# Patient Record
Sex: Female | Born: 1935
Health system: Southern US, Community
[De-identification: ages and names within clinical notes are randomized; demographics above are authoritative.]

## PROBLEM LIST (undated history)

## (undated) DIAGNOSIS — C50919 Malignant neoplasm of unspecified site of unspecified female breast: Secondary | ICD-10-CM

## (undated) DIAGNOSIS — E785 Hyperlipidemia, unspecified: Secondary | ICD-10-CM

## (undated) DIAGNOSIS — H3530 Unspecified macular degeneration: Secondary | ICD-10-CM

## (undated) DIAGNOSIS — I2510 Atherosclerotic heart disease of native coronary artery without angina pectoris: Secondary | ICD-10-CM

## (undated) DIAGNOSIS — I10 Essential (primary) hypertension: Secondary | ICD-10-CM

## (undated) DIAGNOSIS — I4892 Unspecified atrial flutter: Secondary | ICD-10-CM

## (undated) DIAGNOSIS — I499 Cardiac arrhythmia, unspecified: Secondary | ICD-10-CM

## (undated) DIAGNOSIS — K922 Gastrointestinal hemorrhage, unspecified: Secondary | ICD-10-CM

## (undated) DIAGNOSIS — F32A Depression, unspecified: Secondary | ICD-10-CM

## (undated) DIAGNOSIS — I428 Other cardiomyopathies: Secondary | ICD-10-CM

## (undated) DIAGNOSIS — Z950 Presence of cardiac pacemaker: Secondary | ICD-10-CM

## (undated) DIAGNOSIS — D62 Acute posthemorrhagic anemia: Secondary | ICD-10-CM

## (undated) DIAGNOSIS — I429 Cardiomyopathy, unspecified: Secondary | ICD-10-CM

## (undated) DIAGNOSIS — I4819 Other persistent atrial fibrillation: Secondary | ICD-10-CM

## (undated) DIAGNOSIS — C679 Malignant neoplasm of bladder, unspecified: Secondary | ICD-10-CM

## (undated) DIAGNOSIS — I4891 Unspecified atrial fibrillation: Secondary | ICD-10-CM

## (undated) DIAGNOSIS — M1990 Unspecified osteoarthritis, unspecified site: Secondary | ICD-10-CM

## (undated) DIAGNOSIS — F329 Major depressive disorder, single episode, unspecified: Secondary | ICD-10-CM

## (undated) HISTORY — DX: Gastrointestinal hemorrhage, unspecified: K92.2

## (undated) HISTORY — DX: Essential (primary) hypertension: I10

## (undated) HISTORY — DX: Cardiomyopathy, unspecified: I42.9

## (undated) HISTORY — DX: Depression, unspecified: F32.A

## (undated) HISTORY — PX: APPENDECTOMY: SHX54

## (undated) HISTORY — DX: Unspecified osteoarthritis, unspecified site: M19.90

## (undated) HISTORY — DX: Other cardiomyopathies: I42.8

## (undated) HISTORY — DX: Malignant neoplasm of bladder, unspecified: C67.9

## (undated) HISTORY — DX: Atherosclerotic heart disease of native coronary artery without angina pectoris: I25.10

## (undated) HISTORY — PX: BLADDER SURGERY: SHX569

## (undated) HISTORY — PX: OTHER SURGICAL HISTORY: SHX169

## (undated) HISTORY — DX: Acute posthemorrhagic anemia: D62

## (undated) HISTORY — DX: Malignant neoplasm of unspecified site of unspecified female breast: C50.919

## (undated) HISTORY — DX: Unspecified atrial fibrillation: I48.91

## (undated) HISTORY — DX: Unspecified atrial flutter: I48.92

## (undated) HISTORY — DX: Other persistent atrial fibrillation: I48.19

## (undated) HISTORY — PX: ABDOMINAL HYSTERECTOMY: SHX81

## (undated) HISTORY — PX: TOTAL KNEE ARTHROPLASTY: SHX125

## (undated) HISTORY — PX: TONSILLECTOMY: SUR1361

## (undated) HISTORY — DX: Major depressive disorder, single episode, unspecified: F32.9

---

## 2004-02-17 ENCOUNTER — Inpatient Hospital Stay (HOSPITAL_COMMUNITY): Admission: RE | Admit: 2004-02-17 | Discharge: 2004-02-22 | Payer: Self-pay | Admitting: Orthopaedic Surgery

## 2004-02-21 ENCOUNTER — Ambulatory Visit: Payer: Self-pay | Admitting: Cardiology

## 2004-02-21 ENCOUNTER — Encounter: Payer: Self-pay | Admitting: Cardiology

## 2004-03-15 ENCOUNTER — Encounter: Payer: Self-pay | Admitting: Cardiology

## 2004-03-15 ENCOUNTER — Ambulatory Visit: Payer: Self-pay | Admitting: Cardiology

## 2004-12-14 ENCOUNTER — Ambulatory Visit (HOSPITAL_COMMUNITY): Admission: RE | Admit: 2004-12-14 | Discharge: 2004-12-14 | Payer: Self-pay

## 2005-01-17 ENCOUNTER — Inpatient Hospital Stay (HOSPITAL_COMMUNITY): Admission: EM | Admit: 2005-01-17 | Discharge: 2005-01-21 | Payer: Self-pay | Admitting: Emergency Medicine

## 2005-01-19 ENCOUNTER — Ambulatory Visit: Payer: Self-pay | Admitting: Internal Medicine

## 2005-02-16 ENCOUNTER — Ambulatory Visit: Payer: Self-pay | Admitting: Internal Medicine

## 2005-03-02 ENCOUNTER — Ambulatory Visit: Payer: Self-pay | Admitting: Internal Medicine

## 2005-04-23 ENCOUNTER — Ambulatory Visit (HOSPITAL_COMMUNITY): Admission: RE | Admit: 2005-04-23 | Discharge: 2005-04-23 | Payer: Self-pay | Admitting: Pulmonary Disease

## 2005-06-27 ENCOUNTER — Ambulatory Visit: Payer: Self-pay | Admitting: Internal Medicine

## 2005-08-28 ENCOUNTER — Ambulatory Visit: Payer: Self-pay | Admitting: Gastroenterology

## 2005-08-28 ENCOUNTER — Encounter (INDEPENDENT_AMBULATORY_CARE_PROVIDER_SITE_OTHER): Payer: Self-pay | Admitting: *Deleted

## 2005-08-28 ENCOUNTER — Inpatient Hospital Stay (HOSPITAL_COMMUNITY): Admission: RE | Admit: 2005-08-28 | Discharge: 2005-09-01 | Payer: Self-pay | Admitting: Orthopaedic Surgery

## 2005-09-06 ENCOUNTER — Observation Stay (HOSPITAL_COMMUNITY): Admission: AD | Admit: 2005-09-06 | Discharge: 2005-09-06 | Payer: Self-pay | Admitting: Orthopaedic Surgery

## 2005-09-29 ENCOUNTER — Inpatient Hospital Stay (HOSPITAL_COMMUNITY): Admission: EM | Admit: 2005-09-29 | Discharge: 2005-10-04 | Payer: Self-pay | Admitting: Emergency Medicine

## 2005-10-08 ENCOUNTER — Encounter: Payer: Self-pay | Admitting: Cardiology

## 2005-10-15 ENCOUNTER — Encounter: Payer: Self-pay | Admitting: Cardiology

## 2005-12-10 ENCOUNTER — Encounter (HOSPITAL_COMMUNITY): Admission: RE | Admit: 2005-12-10 | Discharge: 2006-01-07 | Payer: Self-pay | Admitting: Orthopaedic Surgery

## 2006-10-08 ENCOUNTER — Ambulatory Visit (HOSPITAL_COMMUNITY): Admission: RE | Admit: 2006-10-08 | Discharge: 2006-10-08 | Payer: Self-pay | Admitting: Pulmonary Disease

## 2006-10-15 ENCOUNTER — Ambulatory Visit (HOSPITAL_COMMUNITY): Admission: RE | Admit: 2006-10-15 | Discharge: 2006-10-15 | Payer: Self-pay | Admitting: Pulmonary Disease

## 2006-10-30 ENCOUNTER — Ambulatory Visit (HOSPITAL_COMMUNITY): Admission: RE | Admit: 2006-10-30 | Discharge: 2006-10-30 | Payer: Self-pay | Admitting: Urology

## 2006-12-21 ENCOUNTER — Emergency Department (HOSPITAL_COMMUNITY): Admission: EM | Admit: 2006-12-21 | Discharge: 2006-12-21 | Payer: Self-pay | Admitting: Emergency Medicine

## 2007-10-27 ENCOUNTER — Ambulatory Visit: Payer: Self-pay | Admitting: Cardiology

## 2008-06-22 ENCOUNTER — Encounter: Payer: Self-pay | Admitting: Cardiology

## 2008-10-06 DIAGNOSIS — I4892 Unspecified atrial flutter: Secondary | ICD-10-CM | POA: Insufficient documentation

## 2009-01-21 ENCOUNTER — Encounter: Payer: Self-pay | Admitting: Physician Assistant

## 2009-01-21 ENCOUNTER — Ambulatory Visit: Payer: Self-pay | Admitting: Cardiology

## 2009-01-21 DIAGNOSIS — I10 Essential (primary) hypertension: Secondary | ICD-10-CM | POA: Insufficient documentation

## 2009-01-21 DIAGNOSIS — E782 Mixed hyperlipidemia: Secondary | ICD-10-CM | POA: Insufficient documentation

## 2009-01-25 ENCOUNTER — Encounter: Payer: Self-pay | Admitting: Cardiology

## 2009-02-22 ENCOUNTER — Encounter (INDEPENDENT_AMBULATORY_CARE_PROVIDER_SITE_OTHER): Payer: Self-pay | Admitting: *Deleted

## 2009-02-22 ENCOUNTER — Ambulatory Visit: Payer: Self-pay | Admitting: Cardiology

## 2009-03-01 ENCOUNTER — Encounter: Payer: Self-pay | Admitting: Cardiology

## 2009-03-02 ENCOUNTER — Telehealth (INDEPENDENT_AMBULATORY_CARE_PROVIDER_SITE_OTHER): Payer: Self-pay | Admitting: *Deleted

## 2009-03-18 ENCOUNTER — Ambulatory Visit: Payer: Self-pay | Admitting: Cardiology

## 2009-09-26 ENCOUNTER — Ambulatory Visit: Payer: Self-pay | Admitting: Cardiology

## 2009-09-26 DIAGNOSIS — I498 Other specified cardiac arrhythmias: Secondary | ICD-10-CM | POA: Insufficient documentation

## 2010-02-07 ENCOUNTER — Ambulatory Visit: Admit: 2010-02-07 | Payer: Self-pay | Admitting: Internal Medicine

## 2010-02-07 NOTE — Assessment & Plan Note (Signed)
Summary: 48 hour holter monitor  Nurse Visit   Serial Vital Signs/Assessments:  Comments: 9:09 AM holter placed on patient w/o difficulty. instructions given to patient. patient verbalized understanding of plan. By: Georgina Peer   CC: 48hour holter   Allergies: 1)  ! Pcn

## 2010-02-07 NOTE — Assessment & Plan Note (Signed)
Summary: 1 mo fu remionder-srs   Visit Type:  Follow-up Primary Provider:  Dr. Rory Percy   History of Present Illness: 77 year old woman presents for a followup visit. Since her last visit a 48-hour Holter monitor was placed to better assess heart rate variability and rhythm in light of occasional episodes of weakness. She has not had any frank syncope. These strips were reviewed with the predominant rhythm being sinus. There was a 2.6 second pause seen. Beta blocker therapy was down titrated since then.  She denies having any frank dizziness or syncope. She does however indicate feeling generally better with less fatigue and weakness since decreasing beta blocker therapy.  Preventive Screening-Counseling & Management  Alcohol-Tobacco     Smoking Status: never  Current Medications (verified): 1)  Metoprolol Succinate 25 Mg Xr24h-Tab (Metoprolol Succinate) .... Take One Tablet By Mouth Daily 2)  Prozac 20 Mg Caps (Fluoxetine Hcl) .... Take 1 Tablet By Mouth Once A Day 3)  Niacin Cr 500 Mg Cr-Caps (Niacin) .... Take 1 Tablet By Mouth Once A Day 4)  Multivitamins  Tabs (Multiple Vitamin) .... Take 1 Tablet By Mouth Once A Day 5)  Coq10 100 Mg Caps (Coenzyme Q10) .... Take 1 Tablet By Mouth Once A Day 6)  Tylenol Extra Strength 500 Mg Tabs (Acetaminophen) .... As Needed. 7)  Asacol 400 Mg Tbec (Mesalamine) .... Take 2 Tabs Two Times A Day 8)  Aspirin 325 Mg Tabs (Aspirin) .... Take 1/2 Tab Daily  Allergies: 1)  ! Pcn  Past History:  Past Medical History: Last updated: 01/21/2009 Atrial Flutter Depression Hypertension Arthritis Ulcerative Colitis  Social History: Last updated: 01/21/2009 Married  Tobacco Use - No Alcohol Use - no Drug Use - no   Review of Systems  The patient denies anorexia, fever, chest pain, syncope, dyspnea on exertion, peripheral edema, melena, and hematochezia.         Otherwise reviewed and negative.  Vital Signs:  Patient profile:   77  year old female Height:      64 inches Weight:      245 pounds Pulse rate:   53 / minute BP sitting:   101 / 67  (left arm) Cuff size:   regular  Vitals Entered By: Lovina Reach, LPN (March 18, 8561 1:49 PM) Is Patient Diabetic? No Comments f/u on monitor   Physical Exam  Additional Exam:  GEN: obese, sitting upright, in no distress HEENT: NCAT,PERRLA,EOMI NECK: palpable pulses, no bruits; no JVD; no TM LUNGS: CTA bilaterally HEART: RRR (S1S2); no significant murmurs; no rubs; no gallops ABD: soft, NT; intact BS EXT: trace edema SKIN: warm, dry    Impression & Recommendations:  Problem # 1:  ATRIAL FLUTTER (ICD-427.32)  Stable without obvious recurrences. Holter monitoring revealed sinus rhythm with a relatively short pause on baseline prolonged PR interval. Metoprolol dose was decreased, and in general she feels better with less fatigue and weakness. She will continue to observe for any breakthrough palpitations. I did discuss reducing caffeine with her. Otherwise follow up in 6 months.  Her updated medication list for this problem includes:    Metoprolol Succinate 25 Mg Xr24h-tab (Metoprolol succinate) .Marland Kitchen... Take one tablet by mouth daily    Aspirin 325 Mg Tabs (Aspirin) .Marland Kitchen... Take 1/2 tab daily  Patient Instructions: 1)  Your physician wants you to follow-up in: 6 months. You will receive a reminder letter in the mail one-two months in advance. If you don't receive a letter, please call our office to schedule  the follow-up appointment. 2)  Your physician recommends that you continue on your current medications as directed. Please refer to the Current Medication list given to you today.

## 2010-02-07 NOTE — Progress Notes (Signed)
Summary: Holter  Left message to call back on patient's machine regarding Dr. Myles Gip recommendations.    ---- Converted from flag ---- ---- 03/02/2009 10:46 AM, Beckie Salts, MD, [facility] wrote: I reviewed the note.  It does not sound like there was any definitive heart block, although a brief pause.  Would decrease Toprol XL to 25 mg daily as noted in the last office visit.  I can look at strips tomorrow when I am back in office.  ---- 03/02/2009 9:25 AM, Gurney Maxin, RN, BSN wrote: Please review this note. Pt has contacted the office today regarding call from Dr. Radford Pax and concern about monitor report. Do you want to review when you are back in the office tomorrow or I can fax report to you (or you could pull up with your brand new password)? ------------------------------  Appended Document: Holter Pt notified. She is aware to decrease Toprol to 34m by mouth once daily and keep follow up appt on 03/18/09. Pt verbalized understanding.   Clinical Lists Changes  Medications: Changed medication from METOPROLOL SUCCINATE 50 MG XR24H-TAB (METOPROLOL SUCCINATE) Take 1 tablet by mouth once a day to METOPROLOL SUCCINATE 25 MG XR24H-TAB (METOPROLOL SUCCINATE) Take one tablet by mouth daily

## 2010-02-07 NOTE — Assessment & Plan Note (Signed)
Summary: 6 month fu recv reminder vs   Visit Type:  Follow-up Primary Provider:  Dr. Rory Percy   History of Present Illness: 77 year old woman presents for followup. I saw her back in March. She was doing better following down titration of beta blocker therapy. She continues to report no excessive fatigue, no palpitations.  Main complaint is of problems with arthritic pain affecting the hands, wrists, and shoulders. She is undergoing evaluation by her primary care physician. She was placed on temporary course of Advil by her orthopedist.  Preventive Screening-Counseling & Management  Alcohol-Tobacco     Smoking Status: never  Current Medications (verified): 1)  Metoprolol Succinate 25 Mg Xr24h-Tab (Metoprolol Succinate) .... Take One Tablet By Mouth Daily 2)  Prozac 20 Mg Caps (Fluoxetine Hcl) .... Take 1 Tablet By Mouth Once A Day 3)  Multivitamins  Tabs (Multiple Vitamin) .... Take 1 Tablet By Mouth Once A Day 4)  Coq10 100 Mg Caps (Coenzyme Q10) .... Take 1 Tablet By Mouth Once A Day 5)  Asacol 400 Mg Tbec (Mesalamine) .... Take 2 Tabs Two Times A Day 6)  Aspirin 325 Mg Tabs (Aspirin) .... Take 1/2 Tab Daily 7)  Advil 200 Mg Tabs (Ibuprofen) .... As Needed 8)  Vitamin D3 1000 Unit Caps (Cholecalciferol) .... Take 1 Tablet By Mouth Once A Day  Allergies (verified): 1)  ! Pcn  Comments:  Nurse/Medical Assistant: The patient's medication list and allergies were reviewed with the patient and were updated in the Medication and Allergy Lists.  Past History:  Past Medical History: Last updated: 01/21/2009 Atrial Flutter Depression Hypertension Arthritis Ulcerative Colitis  Social History: Last updated: 01/21/2009 Married  Tobacco Use - No Alcohol Use - no Drug Use - no   Review of Systems  The patient denies anorexia, fever, chest pain, syncope, dyspnea on exertion, abdominal pain, melena, and hematochezia.         Otherwise negative except as already  outlined.  Vital Signs:  Patient profile:   77 year old female Height:      64 inches Weight:      248 pounds Pulse rate:   61 / minute BP sitting:   143 / 83  (left arm) Cuff size:   large  Vitals Entered By: Georgina Peer (September 26, 2009 2:30 PM)  Physical Exam  Additional Exam:  GEN: obese, sitting upright, in no distress HEENT: NCAT,PERRLA,EOMI NECK: palpable pulses, no bruits; no JVD; no TM LUNGS: CTA bilaterally HEART: RRR (S1S2); no significant murmurs; no rubs; no gallops ABD: soft, NT; intact BS EXT: trace edema, hands/digits, no erythema SKIN: warm, dry    EKG  Procedure date:  09/26/2009  Findings:      Sinus bradycardia at 59 beats per minute with PR interval 256 ms, leftward axis. QRS duration 120 ms.  Impression & Recommendations:  Problem # 1:  ATRIAL FLUTTER (ICD-427.32)  Quiescent, no obvious recurrence. Continue same course of aspirin and low-dose beta blocker at this point.  Her updated medication list for this problem includes:    Metoprolol Succinate 25 Mg Xr24h-tab (Metoprolol succinate) .Marland Kitchen... Take one tablet by mouth daily    Aspirin 325 Mg Tabs (Aspirin) .Marland Kitchen... Take 1/2 tab daily  Orders: EKG w/ Interpretation (93000)  Problem # 2:  HYPERTENSION (ICD-401.9)  Followed by Dr. Nadara Mustard. Blood pressure is elevated, although in the setting of increased pain with arthritis flare.  Her updated medication list for this problem includes:    Metoprolol Succinate 25 Mg Xr24h-tab (Metoprolol  succinate) .Marland Kitchen... Take one tablet by mouth daily    Aspirin 325 Mg Tabs (Aspirin) .Marland Kitchen... Take 1/2 tab daily  Problem # 3:  BRADYCARDIA (ICD-427.89)  Heart rate improved in general, as well as symptoms of fatigue. Continue low-dose beta blocker. Prescription given for Toprol-XL 25 mg daily.  Her updated medication list for this problem includes:    Metoprolol Succinate 25 Mg Xr24h-tab (Metoprolol succinate) .Marland Kitchen... Take one tablet by mouth daily    Aspirin 325  Mg Tabs (Aspirin) .Marland Kitchen... Take 1/2 tab daily  Patient Instructions: 1)  Your physician recommends that you continue on your current medications as directed. Please refer to the Current Medication list given to you today. 2)  Follow up in  6 months Prescriptions: METOPROLOL SUCCINATE 25 MG XR24H-TAB (METOPROLOL SUCCINATE) Take one tablet by mouth daily  #30 x 6   Entered by:   Lovina Reach, LPN   Authorized by:   Beckie Salts, MD, [facility]   Signed by:   Lovina Reach, LPN on 79/15/0569   Method used:   Electronically to        [facility] (retail)       [address]       [place], [place]  [address]       Ph: [phone]       Fax: [phone]   RxID:   [id number]

## 2010-02-07 NOTE — Procedures (Signed)
Summary: Holter and Event/ CARDIONET  Holter and Event/ CARDIONET   Imported By: Bartholomew Boards 03/04/2009 08:16:58  _____________________________________________________________________  External Attachment:    Type:   Image     Comment:   External Document

## 2010-02-07 NOTE — Assessment & Plan Note (Signed)
Summary: 1 YR FU PER DEC REMINDER-SRS  Medications Added METOPROLOL SUCCINATE 50 MG XR24H-TAB (METOPROLOL SUCCINATE) Take 1 tablet by mouth once a day ASACOL 400 MG TBEC (MESALAMINE) take 2 tabs two times a day ASPIRIN 325 MG TABS (ASPIRIN) take 1/2 tab daily HEALTHY KIDS VITAMIN D3 400 UNIT CHEW (CHOLECALCIFEROL) Take 1 tablet by mouth once a day        Visit Type:  Follow-up Primary Provider:  Rory Percy, MD   History of Present Illness: 77 year-old [name], with history of postoperative paroxysmal atrial flutter, returns for annual followup.  Since last seen here in the clinic, the patient denies any symptoms suggestive of exertional angina pectoris. She has no known history of coronary artery disease. She denies tachycardia palpitations. She has noted some late afternoon weakness, typically around 4 PM, over the past 2 weeks, but denies any near-syncope/syncope.  Of note, she takes her Toprol in the mornings. She denies any significant exertional dyspnea.  Preventive Screening-Counseling & Management  Alcohol-Tobacco     Smoking Status: never  Current Medications (verified): 1)  Metoprolol Succinate 50 Mg Xr24h-Tab (Metoprolol Succinate) .... Take 1 Tablet By Mouth Once A Day 2)  Prozac 20 Mg Caps (Fluoxetine Hcl) .... Take 1 Tablet By Mouth Once A Day 3)  Niacin Cr 500 Mg Cr-Caps (Niacin) .... Take 1 Tablet By Mouth Once A Day 4)  Multivitamins  Tabs (Multiple Vitamin) .... Take 1 Tablet By Mouth Once A Day 5)  Coq10 100 Mg Caps (Coenzyme Q10) .... Take 1 Tablet By Mouth Once A Day 6)  Tylenol Extra Strength 500 Mg Tabs (Acetaminophen) .... As Needed. 7)  Asacol 400 Mg Tbec (Mesalamine) .... Take 2 Tabs Two Times A Day 8)  Aspirin 325 Mg Tabs (Aspirin) .... Take 1/2 Tab Daily 9)  Healthy Kids Vitamin D3 400 Unit Chew (Cholecalciferol) .... Take 1 Tablet By Mouth Once A Day  Allergies: 1)  ! Pcn  Past History:  Past Medical History: Atrial  Flutter Depression Hypertension Arthritis Ulcerative Colitis  Review of Systems       No fevers, chills, hemoptysis, dysphagia, melena, hematocheezia, hematuria, rash, claudication, orthopnea, pnd, pedal edema. Complaint of right orbital pain and right-sided headache. All other systems reviewed, and are negative.   Vital Signs:  Patient profile:   77 year old [name] Height:      [name] inches Weight:      241.75 pounds BMI:     41.65 Pulse rate:   58 / minute BP sitting:   135 / 75  (left arm) Cuff size:   regular  Vitals Entered By: Lovina Reach, LPN (January 21, 4538 1:18 PM)  Nutrition Counseling: Patient's BMI is greater than 25 and therefore counseled on weight management options. Is Patient Diabetic? No Comments yearly check   Physical Exam  Additional Exam:  GEN: 32 she'll [name], obese, sitting upright, in no distress HEENT: NCAT,PERRLA,EOMI NECK: palpable pulses, no bruits; no JVD; no TM LUNGS: CTA bilaterally HEART: RRR (S1S2); no significant murmurs; no rubs; no gallops ABD: soft, NT; intact BS EXT: trace edema SKIN: warm, dry MUSC: no obvious deformity NEURO: A/O (x3)     EKG  Procedure date:  01/21/2009  Findings:      sinus bradycardia with first degree AV block 52 bpm; LAD/LAHB; nonspecific ST abnormalities  Impression & Recommendations:  Problem # 1:  ATRIAL FLUTTER (ICD-427.32) patient presents with no clinical history, or current objective evidence, of recurrent atrial flutter. Continue current rate/rhythm control regimen  with Toprol. Of note, patient now has a [place] score of 2, secondary to hypertension and age. In the absence of any recurrent atrial flutter, recommend continuing aspirin. Also, patient does report recent weakness in the late afternoon. She does present with sinus bradycardia/first degree AV block. Recommended 48-hour Holter monitor to assess diurnal variation. If she has evidence of significant bradycardia, recommend decreasing Toprol  to 25 mg daily. Patient is in agreement with this plan. We will schedule a return visit in one month for review of monitor results, and further recommendations. If this is reassuring, she can then resume annual followup with Dr. Domenic Polite.  Problem # 2:  HYPERTENSION (ICD-401.9) Assessment: Comment Only  Problem # 3:  DYSLIPIDEMIA (ICD-272.4) Assessment: Comment Only  Her updated medication list for this problem includes:    Niacin Cr 500 Mg Cr-caps (Niacin) .Marland Kitchen... Take 1 tablet by mouth once a day  Other Orders: EKG w/ Interpretation (93000) Holter Monitor (Holter Monitor)  Patient Instructions: 1)  Your physician has recommended that you wear a holter monitor.  Holter monitors are medical devices that record the heart's electrical activity. Doctors most often use these monitors to diagnose arrhythmias. Arrhythmias are problems with the speed or rhythm of the heartbeat. The monitor is a small, portable device. You can wear one while you do your normal daily activities. This is usually used to diagnose what is causing palpitations/syncope (passing out). PLEASE RETURN ON WEDNESDAY, JANUARY 26TH AT 9 AM TO HAVE MONITOR PLACED. NO OIL, LOTION, POWDER, PERFUME, ETC.  2)  Your Metoprolol was refilled today. 3)  Your physician recommends that you schedule a follow-up appointment in: about 1 month. THIS APPT IS SCHEDULED FOR MARCH 11TH AT 3PM. Prescriptions: METOPROLOL SUCCINATE 50 MG XR24H-TAB (METOPROLOL SUCCINATE) Take 1 tablet by mouth once a day  #30 x 6   Entered by:   Gurney Maxin, RN, BSN   Authorized by:   Maryjane Hurter, PA-C   Signed by:   Gurney Maxin, RN, BSN on 01/21/2009   Method used:   Electronically to        [facility] (retail)       [address]       [place], [place]  [address]       Ph: [phone]       Fax: [phone]   RxID:   [id number]

## 2010-03-13 ENCOUNTER — Ambulatory Visit (INDEPENDENT_AMBULATORY_CARE_PROVIDER_SITE_OTHER): Payer: Self-pay | Admitting: Internal Medicine

## 2010-03-14 ENCOUNTER — Ambulatory Visit (INDEPENDENT_AMBULATORY_CARE_PROVIDER_SITE_OTHER): Payer: Medicare Other | Admitting: Internal Medicine

## 2010-03-14 DIAGNOSIS — K5190 Ulcerative colitis, unspecified, without complications: Secondary | ICD-10-CM

## 2010-03-22 ENCOUNTER — Encounter: Payer: Self-pay | Admitting: *Deleted

## 2010-04-03 ENCOUNTER — Encounter: Payer: Self-pay | Admitting: Cardiology

## 2010-04-04 ENCOUNTER — Ambulatory Visit (INDEPENDENT_AMBULATORY_CARE_PROVIDER_SITE_OTHER): Payer: Medicare Other | Admitting: Cardiology

## 2010-04-04 ENCOUNTER — Encounter: Payer: Self-pay | Admitting: Cardiology

## 2010-04-04 VITALS — BP 143/86 | HR 79 | Ht 64.0 in | Wt 237.0 lb

## 2010-04-04 DIAGNOSIS — R9431 Abnormal electrocardiogram [ECG] [EKG]: Secondary | ICD-10-CM

## 2010-04-04 DIAGNOSIS — I4892 Unspecified atrial flutter: Secondary | ICD-10-CM

## 2010-04-04 NOTE — Assessment & Plan Note (Signed)
Followup ECG shows sinus rhythm, however with progressive QRS widening, now essentially left bundle branch block. Suspect that this is generally related to conduction system disease, although she seems to be tolerating beta blocker therapy reasonably well. She does describe some edema, and a followup 2-D echocardiogram will be obtained to assess cardiac structure and function, exclude cardiomyopathy. If stable, no further testing for now.

## 2010-04-04 NOTE — Assessment & Plan Note (Signed)
Maintains sinus rhythm, no reported palpitations. She has been most comfortable continuing aspirin at this point as well as low-dose beta blocker therapy.

## 2010-04-04 NOTE — Progress Notes (Signed)
Clinical Summary [name] is a 77 y.o.female presenting for routine followup. She was seen in September 2011.  She reports some fatigue, although no significant palpitations or chest pain. She indicates general toleration of her present medical regimen.  She states she has had a recent "cold" and that her husband had "pneumonia."  No syncope. She does report occasional lower extremity edema, dependent in description.   Allergies  Allergen Reactions  . Penicillins     REACTION: rash, years ago  . Demerol Rash    Current Outpatient Prescriptions on File Prior to Visit  Medication Sig Dispense Refill  . Coenzyme Q10 (COQ-10) 100 MG CAPS Take one by mouth daily        . FLUoxetine (PROZAC) 20 MG capsule Take 20 mg by mouth daily.        . Ibuprofen (ADVIL) 200 MG CAPS As needed        . mesalamine (ASACOL) 400 MG EC tablet Take 2 by mouth twice daily        . metoprolol succinate (TOPROL-XL) 25 MG 24 hr tablet Take 25 mg by mouth daily.        . Multiple Vitamin (MULTIVITAMIN) tablet Take 1 tablet by mouth daily.        Marland Kitchen DISCONTD: aspirin 325 MG tablet Take 1//2 by mouth daily       . DISCONTD: Cholecalciferol (VITAMIN D3) 1000 UNITS CAPS Take one by mouth daily          Past Medical History  Diagnosis Date  . Atrial flutter   . Depression   . Essential hypertension, benign   . Arthritis   . Ulcerative colitis     Social History [name] reports that she has never smoked. She does not have any smokeless tobacco history on file. [name] reports that she does not drink alcohol.  Review of Systems No fevers, chills, or unusual weight change. No chest pain, progressive shortness of breath, hemoptysis, or wheezing. No palpitations, dizziness, or syncope. No dysphasia or odynophagia. Stable appetite with no abdominal pain, melena, or hematochezia. Otherwise systems reviewed and negative except as already outlined.   Physical Examination Filed Vitals:   04/04/10 1553    BP: 143/86  Pulse: 79  GEN: obese, sitting upright, in no distress HEENT: NCAT,PERRLA,EOMI NECK: palpable pulses, no bruits; no JVD; no TM LUNGS: CTA bilaterally HEART: RRR (S1S2); no significant murmurs; no rubs; no gallops ABD: soft, NT; intact BS EXT: trace edema, hands/digits, no erythema SKIN: warm, dry   ECG Sinus rhythm at 64 beats per minute with prolonged PR interval of 248 ms, progressive QRS widening at 158 ms, essentially left bundle branch block pattern, new compared to prior tracing.  Problem List and Plan

## 2010-04-04 NOTE — Patient Instructions (Signed)
Your physician wants you to follow-up in: 6 months. You will receive a reminder letter in the mail one-two months in advance. If you don't receive a letter, please call our office to schedule the follow-up appointment. Your physician has requested that you have an echocardiogram. Echocardiography is a painless test that uses sound waves to create images of your heart. It provides your doctor with information about the size and shape of your heart and how well your heart's chambers and valves are working. This procedure takes approximately one hour. There are no restrictions for this procedure.

## 2010-04-19 ENCOUNTER — Encounter: Payer: Self-pay | Admitting: Cardiology

## 2010-04-20 DIAGNOSIS — I4892 Unspecified atrial flutter: Secondary | ICD-10-CM

## 2010-04-24 ENCOUNTER — Other Ambulatory Visit: Payer: Self-pay | Admitting: Cardiology

## 2010-04-25 NOTE — Telephone Encounter (Signed)
**Note De-Identified  Obfuscation** [place] pt.

## 2010-05-23 NOTE — Assessment & Plan Note (Signed)
[facility]ARD[facility] OFFICE NOTE   NAME:[name], [name]                    MRN:          [id number]  DATE:10/27/2007                            DOB:          1930/08/23    PRIMARY CARE PHYSICIAN:  Dr. Wynelle Cleveland   REASON FOR VISIT:  Establish Cardiology followup.   HISTORY OF PRESENT ILLNESS:  [name] is a pleasant 77 year old woman  previously followed by Dr. Lattie Haw, and last seen back in 2006 with a  history of paroxysmal atrial flutter in the postoperative setting  following a total knee replacement.  She has not had regular Cardiology  followup over the last few years, although has been fairly asymptomatic  on beta-blocker therapy which she has continued.  She notes that she  missed a dose recently and felt heart pounding after this, although  took her Toprol and her symptoms completely resolved.  She, otherwise,  has no typical palpitations.  She does state that she feels tired at  times, although is not having any exertional symptoms per se and is not  exercising with any regularity.  Her electrocardiogram today shows sinus  rhythm with a left anterior fascicular block, intraventricular  conduction delay at 120 milliseconds, and prolonged PR interval of 158  milliseconds.  There is also some sinus arrhythmia noted.  Previous  tracing from March 2006 showed similar changes.  [name]' had no  clear dizziness or syncope.  Her CHAD2 score is 1.  Today, we talked  about the general pathophysiology of atrial flutter and the potential  for radiofrequency ablation if she has progressive symptoms on medical  therapy.  At this point, she was most comfortable with observation.   ALLERGIES:  PENICILLIN and CHLOR-TRIMETON.   PRESENT MEDICATIONS:  1. Prozac 20 mg p.o. daily.  2. Toprol-XL 50 mg p.o. daily.  3. Niacin 500 mg p.o. daily.  4. Multivitamin daily.  5. CoQ10 100 mg daily.  6. Tylenol p.r.n.   REVIEW OF SYSTEMS:  As outlined above.  No orthopnea or PND.  No melena,  hematochezia, cough, or hemoptysis.  Otherwise negative.   SOCIAL HISTORY:  The patient is married.  She lives in [place] with her  husband.  She has 13 children.  Denies any tobacco or alcohol use.   FAMILY HISTORY:  Reviewed, noncontributory for premature cardiovascular  disease.   PHYSICAL EXAMINATION:  VITAL SIGNS:  Blood pressure is 143/82, heart  rate is 59, and weight is 241 pounds.  GENERAL:  This is an obese woman in no acute distress.  HEENT:  Conjunctivae are normal.  Oropharynx is clear.  NECK:  Supple.  No elevated jugular venous pressure.  No loud bruits.  LUNGS:  Clear without breathing at rest.  CARDIAC:  Regular rate and rhythm.  No S3 gallop or pericardial rub.  No  loud systolic murmur.  EXTREMITIES:  Exhibit a trace edema, nonpitting and symmetrical.   IMPRESSION AND RECOMMENDATIONS:  History of paroxysmal atrial flutter in  the postoperative setting following knee replacement back in 2006.  Based on symptom review, this has not been a  major recurrent problem and  the patient is tolerating Toprol-XL reasonably well.  I have asked her  that she start taking an aspirin along with this, and we will plan to  continue followup on an annual basis assuming she remains  symptomatically stable.  She is establishing care with Dr. Rhae Lerner (her  husband's primary care physician) and will be seen here through the [facility].     Satira Sark, MD  Electronically Signed    SGM/MedQ  DD: 10/27/2007  DT: 10/28/2007  Job #: [id number]   cc:   Wynelle Cleveland

## 2010-05-24 ENCOUNTER — Other Ambulatory Visit: Payer: Self-pay | Admitting: Cardiology

## 2010-05-25 ENCOUNTER — Other Ambulatory Visit: Payer: Self-pay | Admitting: *Deleted

## 2010-05-25 MED ORDER — METOPROLOL SUCCINATE ER 25 MG PO TB24: 25.0000 mg | ORAL_TABLET | Freq: Every day | ORAL | Status: DC

## 2010-05-26 NOTE — Group Therapy Note (Signed)
[name], HOH             ACCOUNT NO.:  [id number]   MEDICAL RECORD NO.:  [id number]          PATIENT TYPE:  INP   LOCATION:  A327                          FACILITY:  [facility]   PHYSICIAN:  Edward L. Luan Pulling, M.D.DATE OF BIRTH:  Apr 29, 1930   DATE OF PROCEDURE:  01/18/2005  DATE OF DISCHARGE:                                   PROGRESS NOTE   SUBJECTIVE:  Ms. Haseley was admitted with diarrheal illness yesterday. She  says she is doing better, but she is still having significant problems with  diarrhea. She does not have any new problems. She says she still feels  better, but she does not feel quite up to par. Otherwise, everything is  going fairly well. Her result from her C. Difficile screen is pending, but  the concern is that this very well may indicate that she has C. Difficile,  and we are just waiting for the results. Otherwise, the rest of her problems  are doing okay. Her depression, of course, is the same.   PHYSICAL EXAMINATION:  CHEST:  Her chest is clear.  HEART:  Regular.  ABDOMEN:  Soft but still mildly tender.   ASSESSMENT:  Assessment is that she has nausea, vomiting and diarrhea,  probably related to C difficile, but perhaps simply a gastrointestinal  virus. We are awaiting the results of the C. Difficile titer and then decide  what to do. I do not plan any other new treatments now.      Edward L. Luan Pulling, M.D.  Electronically Signed     ELH/MEDQ  D:  01/18/2005  T:  01/18/2005  Job:  [id number]

## 2010-05-26 NOTE — Consult Note (Signed)
[name], [name]             ACCOUNT NO.:  [id number]   MEDICAL RECORD NO.:  [id number]          PATIENT TYPE:  INP   LOCATION:  A303                          FACILITY:  [facility]   PHYSICIAN:  Edward L. Luan Pulling, M.D.DATE OF BIRTH:  10/20/1930   DATE OF CONSULTATION:  DATE OF DISCHARGE:                                   CONSULTATION   A patient of Dr. Brooke Bonito.   HISTORY OF PRESENT ILLNESS:  [name] is a 77 year old who had knee  replacement in her left knee done today. She has had a previous right total  knee replacement in February 2006. She has been hospitalized earlier this  year with abdominal pain, and then she was found to have ulcerative colitis  which is a totally new diagnosis now. She had had diarrhea at that time. Was  treated for what was thought to be diverticulitis because of a previous  history of diverticulitis. She took Flagyl and Cipro, but ended up with a  diagnosis of ulcerative colitis. In addition to that she has a history of  hypertension, chronic arthritis with knee replacement on the right and now  on the left, and depression for which she had been taking Prozac. She has  actually had a history of ECT in the past for depression. She had an  appendectomy and hysterectomy in the past as well.   SOCIAL HISTORY:  She is married. Lives at home with her husband in [place]. She  does not drink any alcohol or smoke.  She does not use any other drugs.   FAMILY HISTORY:  Positive for multiple family members with hypertension and  arthritis.   PHYSICAL EXAMINATION:  GENERAL:  Shows that she is mildly obese. She is  postop. Her knee is in the manipulation device. CHEST:  Clear.  HEART:  Regular.  ABDOMEN:  Soft.  EXTREMITIES:  Showed no edema.  CNS:  Grossly intact.  HEENT:  Mucous membranes are moist.   LABORATORY DATA:  CBC today:  White count 12,700, hemoglobin 11.4. This is  at 1800 today.   ASSESSMENT:  She is doing well. She does have hypertension. She  has a  history of depression.   PLAN:  At this point would be to continue with her current medications and  treatments and follow with  Dr. Luna Glasgow. Thanks for allowing me to see her with you.      Edward L. Luan Pulling, M.D.  Electronically Signed     ELH/MEDQ  D:  08/28/2005  T:  08/29/2005  Job:  [id number]

## 2010-05-26 NOTE — Consult Note (Signed)
NAMEJANANN, BOEVE             ACCOUNT NO.:  [id number]   MEDICAL RECORD NO.:  [id number]          PATIENT TYPE:  INP   LOCATION:  A303                          FACILITY:  [facility]   PHYSICIAN:  Caro Hight, M.D.      DATE OF BIRTH:  Nov 18, 1930   DATE OF CONSULTATION:  08/28/2005  DATE OF DISCHARGE:                                   CONSULTATION   REFERRING PHYSICIAN:  J. Sanjuana Kava, M.D.   REASON FOR CONSULTATION:  Ulcerative colitis.   HISTORY OF PRESENT ILLNESS:  Ms. Rampersad is a 78 year old female who was  admitted on August 27, 2005 for a left knee replacement.  She has been  diagnosed with ulcerative colitis for the last year and a half.  Her last  visit with Dr. Melony Overly was approximately 1-2 months ago.  She reports having  an irregular bowel pattern.  She may get up in the morning at 11 a.m.,  having a bowel movement which is normal.  She may have to subsequently go  approximately 4 hours later and another 4 hours.  The next day she may only  go once.  She is never regular.  She has bowel movements on a daily basis.  She may be up 2-3 times at night having small bowel movements.  It has no  particular pattern and varies from week to week.  She describes her energy  level as terrible since she has had colitis.  She just feels weak.  Her  appetite is fair.  She really does not feel hungry.  She denies any  abdominal pain.  She is currently on Asacol 2 b.i.d. for her colitis.   PAST MEDICAL HISTORY:  1. Tachycardia.  2. Hypertension.  3. Depression.   PAST SURGICAL HISTORY:  1. Right total knee replacement.  2. Appendectomy.  3. Uterus and bladder surgery.   ALLERGIES:  Penicillin and Chlor-Trimeton.   MEDICATIONS:  1. Asacol 2 b.i.d.  2. Toprol-XL.  3. Prozac.   FAMILY HISTORY:  She has a family history of arthritis.   REVIEW OF SYSTEMS:  Review of systems is per the HPI.  Otherwise, all  systems are negative.   PHYSICAL EXAMINATION:  GENERAL:  Afebrile,  hemodynamically stable.  In  general, she is in apparent distress, alert and oriented x4.  HEENT:  Atraumatic, normocephalic.  Pupils equal and reactive to light.  Mouth no oral lesions.  NECK:  Her neck has full range of motion and no lymphadenopathy.  LUNGS:  Her lungs are clear to auscultation bilaterally.  CARDIOVASCULAR:  Her cardiovascular exam is a regular rhythm, no murmur,  normal S1 and S2.  ABDOMEN:  Bowel sounds are present, soft, nontender, obese, nondistended, no  rebound or guarding.  EXTREMITIES:  Her right lower extremity has apparatus which causes flexion  and extension of her knee.  NEUROLOGIC:  She has no focal neurologic deficits.   ASSESSMENT:  Ms. Baskerville is a 77 year old female with ulcerative colitis  whose symptoms are fairly well controlled.  She intermittently has change in  her bowel habits, and the differential diagnosis includes mildly active  ulcerative colitis versus irritable bowel syndrome. Thank you for allowing  me to see Ms. Myre in consultation.  My recommendations follow.   RECOMMENDATIONS:  1. Continue Asacol 2 p.o. b.i.d.  2. Avoid NSAIDs.  3. Schedule outpatient visit with Dr. Melony Overly one month post discharge to      discharge management of her UC and/or irritable bowel syndrome.  Could      consider increasing the dose of Asacol or adding Bentyl 10 mg t.i.d. or      Levsin sublingual as needed or mesalamine enema to achieve more ideal      control of her symptoms.      Caro Hight, M.D.  Electronically Signed     SM/MEDQ  D:  08/30/2005  T:  08/30/2005  Job:  [id number]   cc:   Percell Miller L. Luan Pulling, M.D.  Fax: [phone]

## 2010-05-26 NOTE — Op Note (Signed)
[name], [name]             ACCOUNT NO.:  [id number]   MEDICAL RECORD NO.:  [id number]          PATIENT TYPE:  AMB   LOCATION:  DAY                           FACILITY:  [facility]   PHYSICIAN:  J. Sanjuana Kava, M.D. DATE OF BIRTH:  [birth year]   DATE OF PROCEDURE:  02/17/2004  DATE OF DISCHARGE:                                 OPERATIVE REPORT   PREOPERATIVE DIAGNOSIS:  Severe degenerative joint disease of the right  knee.   POSTOPERATIVE DIAGNOSIS:  Severe degenerative joint disease of the right  knee.   PROCEDURE:  Total knee arthroplasty of the right knee using an [employer] #7  tibial tray, #7 femoral component, a #15 tibial ______, a #7 patella button,  all with methyl methacrylate.   ANESTHESIA:  Spinal.   TOURNIQUET TIME:  1 hour 10 minutes.   SURGEON:  Dr. Luna Glasgow   ASSISTANT:  Arita Miss, PA-C   INDICATIONS FOR PHYSICIAN ASSISTANT:  We are a small community hospital.  There is no house staff.  The use of physician assistant was necessary to  help me with cuts, placing the prosthesis.   INDICATIONS FOR PROCEDURE:  The patient has severe degenerative joint  disease of the right knee.  She has pain with rest, pain with activity.  She  has to use a walker to ambulate and is getting progressively worse, has not  improved.  X-rays show severe degenerative joint disease in all compartments  with multiple loose bodies.  Risks and imponderables of the procedure have  been discussed with the patient in detail preoperatively, and she appears to  understand and agrees with the procedure as outlined.   DESCRIPTION OF PROCEDURE:  The patient was seen by me in the holding area  with her husband, and she had already marked the right knee as the correct  surgical site.  I marked the right knee as the correct surgical site.  The  patient was brought back to the operating room, given a spinal anesthesia.  There was slight difficulty giving the spinal, but we were able to do  this.  The patient then placed supine.  Tourniquet placed deflated on the right  upper thigh.  Sandbag placed under the padding of the operating room table  on the right hip which was then prepped and draped in the usual manner.  We  again identified the patient; we were doing her right knee with total knee  arthroplasty.   Leg was elevated, wrapped circumferentially with Esmarch bandage, and  tourniquet inflated to 300 mmHg, Esmarch bandage removed.  Midline incision  was made.  Parapatellar incision was made.  Patella was everted.  There were  multiple loose bodies, two very large ones, and multiple small ones were  removed.  Approximately 10-15 in total.  Patella was everted; knee was  flexed.  First, a drill hole was made, first gig was placed.  I elected to  take 4 mm extra off the distal femur because she lacked full extension of  her knee.  She only had about 6 degrees in full extension.  Measuring device  was used to  measure to 7.  The 7 cutting gig was selected.  Cuts were made  anterior and posterior, and posterior chamfer and anterior chamfer.  Attention directed to the tibia.  The tibia was further exposed.  The  external cutting device was used, took 8 mm off the low side which was off  the medial side.  She had severe degenerative joint disease and worn down on  the medial side significantly.  The knee was loose medially.  The proximal  tibia was removed.  The size 7 tibial component fit the best.  Attention  directed to the femur; the femur cutting jigs were used.  Trial femur was  placed, a #7, used the trial tibia which actually measured between a 15 and  an 18.  The 15 was slightly loose in full extension, and 18 was snug and  could get right into extension but because we had trouble getting her to  full extension preop, and we did get her to extension, but it was rather  tight, I elected to use a 15 even though it was slightly more loose than the  18.  I was afraid that  the 18 although it did well unless it  tightens down  after surgery, she may have difficulty getting the full extension.  She had  been walking without extension for some time.  The 15 was selected.  The  tibia was prepared using the arrowheads.  The tibial component was prepared.  Attention directed to the patella.  The patella measured 24 mm from the high  point.  We cut off 10 mm, left 14, had smooth contour, #7 patella button was  selected; drill holes were made.  Methyl methacrylate was mixed under a  vacuum and then placed in a caulk gun and applied in a viscus state.  First  the tibial component, then the femoral component, and then the patellar  wafer, patella button.  Methyl methacrylate set up.  Hemovac was placed,  sewn in with 2-0 silk.  Fascia was reapproximated using #1 Ethibond suture  in a figure-of-eight fashion.  I reefed up the medial side somewhat because  of looseness there.  X-rays were taken in [facility] and lateral views, and these  looked good.  Tourniquet was deflated prior to getting the x-rays after 1  hour and 10 minutes.  Subcutaneous tissue reapproximated using 2-0 plain.  Skin reapproximated with skin staples.  Sterile dressing applied.  Bulky  dressing applied.  CryoCuff applied.  The patient was taken to recovery and  placed in a CPM machine.  She tolerated the procedure well.      JWK/MEDQ  D:  02/17/2004  T:  02/17/2004  Job:  [id number]   cc:   Velvet Bathe Hasanaj  [place]  [place] [address]  Fax: [phone]

## 2010-05-26 NOTE — Group Therapy Note (Signed)
[name], DEHNE             ACCOUNT NO.:  [id number]   MEDICAL RECORD NO.:  [id number]           PATIENT TYPE:   LOCATION:                                [facility]   PHYSICIAN:  Audria Nine, M.D.    DATE OF BIRTH:   DATE OF PROCEDURE:  01/19/2005  DATE OF DISCHARGE:  01/21/2005                                   PROGRESS NOTE   SUBJECTIVE:  Patient feels slightly better today.  She has had about 3-4  episodes of diarrhea in the morning.  She has no fever or chills.  She  denies any abdominal pain.  She, overall, I feel is very good. She has no  nausea or vomiting. She is looking forward to having flexible sigmoidoscopy  later today.   OBJECTIVE:  The patient is conscious, alert, comfortable, not in acute  distress.  Well-oriented to time, place and person.  Blood pressure was  166/75, pulse of 79, respirations of 20.  T-max 100.4 degrees F.  HEENT  EXAM:  Normocephalic, atraumatic.  Oral mucosa was moist with no exudate.  NECK:  Supple.  No JVD no lymphadenopathy.  LUNGS: Clear.  Clinically with  good air entry bilaterally.  HEART: S1-S2 regular, no S3-S4, gallops or  rubs.  ABDOMEN:  Soft, nontender.  Bowel sounds were positive, obese.  EXTREMITIES:  No pitting or pedal edema.  No cough, induration or tenderness  was noted.   LABORATORY DATA:  White blood cell count was 15,000; hemoglobin of 10.7;  hematocrit of 30.9.  There was a left shift with neutrophils of 79%.  Sodium  138, potassium 3.7, chloride 109, CO2 24, glucose 93, BUN 5, creatinine 0.7.  Calcium of 8.4.  C. difficile toxin was negative.  Stool cultures were  negative.  Stool for wbc's was also negative.   ASSESSMENT AND PLAN:  1.  Acute gastroenteritis.  This is probably simply viral.  The patient is      currently scheduled to have an esophagogastroduodenoscopy performed by      Dr. Laural Golden, today.  Her diarrhea has slightly improved this afternoon.      We will continue with hydration at this  time.  Will also continue with      empiric therapy with metronidazole 250 mg p.o. t.i.d. until we have a      report of her flexible sigmoidoscopy.  2.  Hypertension.  Her blood pressure is slightly elevated, today.  Her      Toprol was on hold, probably secondary to her dehydration when she was      admitted.  Would recommend restarting this Toprol as soon as possible.   DISPOSITION:  She does have a leukocytosis and I think that it would be  reasonable to continue the antibiotic for now until we have some negative  evidence of a bacterial infection, especially C. difficile or leukocytosis  may be from a simple viral infection. I anticipate that she may be  discharged home in the next 1-2 days.      Audria Nine, M.D.  Electronically Signed     AM/MEDQ  D:  01/19/2005  T:  01/19/2005  Job:  [id number]

## 2010-05-26 NOTE — Group Therapy Note (Signed)
NAMEJAIYA, MOORADIAN             ACCOUNT NO.:  [id number]   MEDICAL RECORD NO.:  [id number]          PATIENT TYPE:  INP   LOCATION:  A303                          FACILITY:  [facility]   PHYSICIAN:  Edward L. Luan Pulling, M.D.DATE OF BIRTH:  1930-12-22   DATE OF PROCEDURE:  10/02/2005  DATE OF DISCHARGE:                                   PROGRESS NOTE   Ms. Putman says that she ate well yesterday but had some nausea last night.  She is otherwise feeling well and has no complaints.  She is not nauseated  now but she is not hungry either.  Her lab work this morning, sed rate is  48.  CBC shows white count 6300, hemoglobin is 10.2.  Her urine culture has  insignificant growth.  One blood culture was positive for gram-negative rod.  This was actually drawn from the PICC line.  The other blood cultures thus  far are negative.  My assessment is that she likely had some sort of a  contamination of her PICC line and then when she had fluids pushed into the  PICC line she ended up having boluses of bacteria.  She is now afebrile.  Her sed rate has come down.  She looks much better.  Her IV has come out  this morning and I am going to discuss her situation with one of the  infectious disease specialist and see if there is a possibility of treating  this with for instance oral Levaquin.  I know that will depend to some  extent on the bacteria that is discovered but also see if she needs to have  another PICC line placed so that we can treat her with IV antibiotics.      Edward L. Luan Pulling, M.D.  Electronically Signed     ELH/MEDQ  D:  10/02/2005  T:  10/03/2005  Job:  [id number]

## 2010-05-26 NOTE — Discharge Summary (Signed)
[name], [name]             ACCOUNT NO.:  [id number]   MEDICAL RECORD NO.:  [id number]          PATIENT TYPE:  INP   LOCATION:  A340                          FACILITY:  [facility]   PHYSICIAN:  J. Sanjuana Kava, M.D. DATE OF BIRTH:  [birth year]   DATE OF ADMISSION:  02/17/2004  DATE OF DISCHARGE:  02/14/2006LH                                 DISCHARGE SUMMARY   DISCHARGE DIAGNOSIS:  Severe degenerative joint disease of the knee on the  right.   PROCEDURE PERFORMED:  Total knee arthroplasty on the right using an  Osteonics #7 tibial tray, #7 femoral component, #15 tibial component, and a  #7 patellar button with methylmethacrylate.   DISCHARGE STATUS:  Improved. Prognosis good.   DISPOSITION:  Good.   The patient will be seen in my office on March 7. The patient also to be  followed by Dr. Lattie Haw in 3 weeks. Home health has been arranged.   DISCHARGE MEDICATIONS:  1.  Vicodin ES 1 every 4 p.r.n. pain.  2.  Levaquin 500 mg 1 p.o. q.d. for 7 days.  3.  Toprol 100 mg 1 daily.   OTHER DIAGNOSES:  1.  Atrial flutter.  2.  Hypertension.  3.  Hypopotassemia.  4.  Postoperative anemia.   The patient is a 77 year old with severe pain and tenderness in her knee on  the right. She underwent total knee arthroplasty as stated. She was admitted  that day. She was followed up in the hospital by Dr. ____________ as Dr.  Sherrie Sport was her family doctor. She was placed on a PCA pump immediately post  surgery. She tolerated this well. The first postoperative day, her T-max was  100. She used a PCA pump and had good relief. CPM was adjusted. Respiratory  therapy was begun. She was seen by physical therapy initially and made good  steady progress throughout her stay. The second postoperative day, she was  afebrile. Labs were normal. Hemovac was removed. Pain was well controlled.  With physical therapy, she was ambulating 10 to 20 feet. She complained of  some nausea postoperative. She started  developing a significant tachycardia  on February 12. She was seen in consultation by cardiology. They evaluated  her and felt she may have had atrial flutter. Hypertension was controlled.  Vital signs were normal, and her heart then returned to a normal rhythm. It  was felt by cardiology who did an echocardiogram that there was any need to  delay further stay in the hospital, and she could be discharged. She was  well monitored during these episodes. On February 13, she was afebrile.  Foley was discontinued. IV was discontinued. CPM was discontinued. On  February 13 is when had her episode of the heart problem. She was discharged  home on February 14. She was doing well. She was walking well, doing well in  physical therapy. Had excellent range of motion of her knee. Wounds looked  good. Orders for sutures to be removed were made. If she had any difficulty  at home, she was to let us know. She did have a drop in her potassium  during  the hospital stay, and this was corrected. She did receive transfusion  immediately after surgery and got another on February 10. She had an  echocardiogram which showed moderate left atrial enlargement, minimal aortic  valvular stenosis, normal tricuspid and pulmonary valve, normal mitral  valve, and normal IVC.      JWK/MEDQ  D:  03/09/2004  T:  03/09/2004  Job:  [id number]

## 2010-05-26 NOTE — Op Note (Signed)
NAMESAMADHI, MAHURIN             ACCOUNT NO.:  [id number]   MEDICAL RECORD NO.:  [id number]          PATIENT TYPE:  INP   LOCATION:  A303                          FACILITY:  [facility]   PHYSICIAN:  J. Sanjuana Kava, M.D. DATE OF BIRTH:  10-04-1930   DATE OF PROCEDURE:  08/28/2005  DATE OF DISCHARGE:                                 OPERATIVE REPORT   PREOPERATIVE DIAGNOSIS:  Severe degenerative joint disease, left knee.   POSTOPERATIVE DIAGNOSIS:  Severe degenerative joint disease, left knee.   OPERATION PERFORMED:  Total knee arthroplasty on the left using [employer]  Scorpio system with a #7 femoral component, a #7 tibial component, a #18  tibial wafer and a #7 patellar button.  Methyl methacrylate used.   SURGEON:  J. Sanjuana Kava, M.D.   ASSISTANT:   ANESTHESIA:  Spinal.   TOURNIQUET TIME:  One hour and 22 minutes.   INDICATIONS FOR PROCEDURE:  The patient has severe degenerative joint  disease of the left knee.  She underwent total knee arthroplasty on the  right in February 2006 and did well.  She was scheduled for surgery earlier  this year but developed a significant case of ulcerative colitis and was  treated by Hildred Laser, M.D., now feels she is stable and able to undergo  the procedure.  Risks and imponderables of procedure have been discussed  with the patient in detail.  She understands these as she has undergone a  total knee on the right.  She has also attended the total joint classes here  at the hospital.  She was given 2 units of blood through autotransfusion  through the [employer].  She understands about infection, loosening,  pulmonary embolus which could cause death, need for physical therapy.   DESCRIPTION OF PROCEDURE:  The patient was seen in the holding area.  The  left knee was identified as the correct surgical site.  She placed a mark on  the left knee and I placed a mark on the left knee.  She was brought back to  the operating room.  She  underwent spinal anesthesia.  She was placed supine  on the operating room table with tourniquet placed deflated on the left  upper thigh.  She was prepped and draped in the usual manner.  The patient  was re-identified as Ms. Hume and we are doing the left knee for total  knee.  The patient's leg was elevated and wrapped circumferentially with  Esmarch bandage.  The tourniquet was inflated to 300 mmHg.  Esmarch bandage  removed.  A midline incision was made.  Parapatellar incision was made.  The  patella was everted. There was significant degenerative disk disease  present.  Multiple loose bodies present.  First drill hole was made and the  first gig was placed.  I elected to take an extra 4 mm off the distal femur  as she lacked approximately 5 degrees of full extension of her knee.  Cuts  were made and we measured her to a size 7.  Cuts were then made for the  femur, anterior and posterior cuts, anterior  chamfer was done after the  posterior chamfer had been cut.  Attention was directed to the tibia.  The  tibia was brought forward and using an external guide I cut 8 mm below the  deepest portion which as on the medial aspect.  Attention was directed to  the femur.  The box cuts were made.  Trial femur fit very nicely.  A 7  femoral component was used.  On the tibia, a 7 tibial plateau template fit  the best.  I found that __________  15 and 18 wafer and 18 wafer gave more  stability and fit very nicely with full extension.  Elected to use an 18  tibial wafer.  Marks were made and cuts for the tibia were then completed  with the silicone arrowheads.  Trial reduction carried out and fit very  nicely with an 18.  Attention was directed to the patella.  It measured only  18 mm.  I left 12 mm of bone left after using the saw.  I then placed an 18  mm tibial button.  The area was cleansed with a Water-Pik and glue was mixed  with methyl methacrylate in a viscous state using a calk gun.  Glue  was then  inserted, first the tibial component and then a femoral component.  Excess  glue was removed.  The tibial wafer was then placed and then the patellar  button was placed.  Once the glue had hardened, Hemovac was placed and sewn  in with 2-0 silk.  Wound was reapproximated using #1 Surgilon suture,  interrupted figure-of-eight.  Tourniquet deflated at 1 hour and 22 minutes.  X-ray taken and was very good in AP and lateral views.  Subcutaneous tissue  closed in layers using 2-0 plain, skin reapproximated using skin staples.  Sterile dressing applied.  Cryo/Cuff placed on the knee after sterile  dressing.  Patient to go to recovery room.  She will be placed in a CPM  machine.  The patient tolerated the procedure well.           ______________________________  J. Sanjuana Kava, M.D.     JWK/MEDQ  D:  08/28/2005  T:  08/28/2005  Job:  [id number]

## 2010-05-26 NOTE — Op Note (Signed)
[name], [name]             ACCOUNT NO.:  [id number]   MEDICAL RECORD NO.:  [id number]           PATIENT TYPE:   LOCATION:                                FACILITY:  [facility]   PHYSICIAN:  Hildred Laser, M.D.         DATE OF BIRTH:   DATE OF PROCEDURE:  01/19/2005  DATE OF DISCHARGE:  01/21/2005                                 OPERATIVE REPORT   PROCEDURE:  Flexible sigmoidoscopy.   INDICATIONS:  [name] is A 77 year old Caucasian female who presents with  unexplained diarrhea. Her stool studies have been negative, except she has  been on Flagyl for 2 days but without any improvement. Stool C. difficile  toxin titer is negative, but she had multiple wbc's on wet prep. She is  undergoing diagnostic flexible sigmoidoscopy. Procedure and risks were  reviewed with the patient and informed consent was obtained.   MEDICINES FOR CONSCIOUS SEDATION:  Demerol 25 mg IV Versed 4 mg IV.   FINDINGS:  Procedure performed in endoscopy suite. The patient's vital signs  and O2 saturations were monitored during procedure and remained stable. The  patient was placed in the left lateral recumbent position and rectal  examination performed. No abnormality noted on external or digital exam.  Olympus videoscope was placed in the rectum and advanced, under vision, and  rectal examination performed. No abnormality noted on external or digital  exam. Olympus videoscope was placed in the rectum where mucosa was noted to  be abnormal with loss of vascularity and friability. Similar changes were  noted in the sigmoid colon, descending, as well as up to the mid transverse  colon.  Pictures taken for the record. Biopsy was taken from the transverse  colon and sigmoid colon, and submitted in separate containers. Stool sample  was also obtained and sent for another C. difficile toxin titer. Endoscope  was withdrawn. The patient tolerated the procedure well.   FINAL DIAGNOSIS:  1.  Diffuse colitis up to mid  transverse colon.  Suspect that we dealing      with pancolitis.  2.  She could have acute nonspecific colitis or ulcerative colitis (UC).   RECOMMENDATIONS:  We will switch her to Bentyl 10 mg t.i.d.. If cultures  remain negative, we will start her on prednisone in a.m..      Hildred Laser, M.D.  Electronically Signed    NR/MEDQ  D:  01/19/2005  T:  01/20/2005  Job:  [id number]   cc:   Percell Miller L. Luan Pulling, M.D.  Fax: [phone]

## 2010-05-26 NOTE — Group Therapy Note (Signed)
[name], [name]             ACCOUNT NO.:  [id number]   MEDICAL RECORD NO.:  [id number]          PATIENT TYPE:  INP   LOCATION:  A303                          FACILITY:  [facility]   PHYSICIAN:  Edward L. Luan Pulling, M.D.DATE OF BIRTH:  01-05-1931   DATE OF PROCEDURE:  09/01/2005  DATE OF DISCHARGE:  09/01/2005                                   PROGRESS NOTE   PRIMARY CARE PHYSICIAN:  Patient of Dr. Luna [name].   PROBLEM:  Status post knee replacement.   SUBJECTIVE:  [name] says she is feeling okay.  Her abdomen is better.  She has less abdominal discomfort and she says she is breathing okay also.   OBJECTIVE:  Her physical examination today shows her temperature is 99.3,  pulse 93, respirations 30, blood pressure 153/81, O2 saturation is 96% on 2  liters.  Her abdomen is softer.  Her chest is clear.   ASSESSMENT:  She is doing better.   PLAN:  She may be discharged by Dr. Luna [name].      Edward L. Luan Pulling, M.D.  Electronically Signed     ELH/MEDQ  D:  09/01/2005  T:  09/02/2005  Job:  [id number]

## 2010-05-26 NOTE — H&P (Signed)
[name], [name]             ACCOUNT NO.:  [id number]   MEDICAL RECORD NO.:  [id number]          PATIENT TYPE:  INP   LOCATION:  A327                          FACILITY:  [facility]   PHYSICIAN:  Edward L. Luan Pulling, M.D.DATE OF BIRTH:  1930/07/10   DATE OF ADMISSION:  01/17/2005  DATE OF DISCHARGE:  LH                                HISTORY & PHYSICAL   REASON FOR ADMISSION:  Diarrhea.   HISTORY:  [name] is a 77 year old who started having diarrhea the first  time about a month ago. She was treated as an outpatient for presumed  diverticulitis because of a previous history of diverticulitis. Then, she  got over that after taking Flagyl and Cipro. About a week ago, she had a  single bout of diarrhea and then about three days prior to admission, she  started having significant problems with diarrhea. She has continued having  diarrhea despite the use of Pepto-Bismol, Imodium, etc., and has come to the  emergency room. When seen in the emergency room, she appeared to be in some  acute distress. Her white blood count was 14,900, and because of these  findings, she is going to admitted.   PAST MEDICAL HISTORY:  1.  Positive for hypertension.  2.  Chronic arthritis. She is actually set for knee surgery on the 30th.  3.  Depression for which she is taking Prozac. She has had a history of      electroconvulsive treatment  for depression and has fairly poor memory      for some of the events of her past medical history.  4.  Surgically, she has had an appendectomy and a hysterectomy.   SOCIAL HISTORY:  She is married, lives at home with her husband in [place]. She  does not drink any alcohol. She does not smoke. She does not use any other  drugs.   MEDICATIONS:  1.  Toprol XL 50 one daily.  2.  Diclofenac 75 mg b.i.d.  3.  Prozac 20 mg daily.   ALLERGIES:  She thinks she is allergic to PENICILLIN.   REVIEW OF SYSTEMS:  Except as mentioned. She has not really had anyone else  in the  family who has been sick. She has not had any exposures as far as she  knows.   FAMILY HISTORY:  Positive for arthritis and apparently for hypertension. She  has not lost any weight. She has not had any fevers, chills, nausea,  vomiting but has had the diarrhea.   PHYSICAL EXAMINATION:  GENERAL:  Shows a well-developed, obese female who is  in no acute distress but looks sick.  VITAL SIGNS:  Her heart rate is about 110, blood pressure 120/70. She is  afebrile.  HEENT:  Her pupils are reactive to light and accommodation. Nose and throat  are clear. Mucous membranes slightly dry.  CHEST:  Her chest is fairly clear.  ABDOMEN:  Soft without masses, mildly tender diffusely.  EXTREMITIES:  Showed no edema.  CENTRAL NERVOUS SYSTEM:  Grossly intact.   LABORATORY DATA:  White count is 14,900, hemoglobin 12.9, platelets 350. She  has 81% neutrophils. Her sodium is 136, potassium 3.6, CO2 24, glucose 134,  BUN 15, creatinine 1. Liver functions are normal. Stool is heme positive but  brown and watery.   ASSESSMENT:  She has abdominal discomfort which is recurrent. She is  probably mildly dehydrated. She has an elevated white blood cell count, and  she has blood in her stools, so clearly she is going to need to be admitted  to the hospital. I am going to hold on antibiotic, ask for another CT of the  abdomen, set her up with GI team and then decide what to do from there.      Edward L. Luan Pulling, M.D.  Electronically Signed     ELH/MEDQ  D:  01/17/2005  T:  01/17/2005  Job:  [id number]

## 2010-05-26 NOTE — Consult Note (Signed)
[name], [name]             ACCOUNT NO.:  [id number]   MEDICAL RECORD NO.:  [id number]          PATIENT TYPE:  INP   LOCATION:  A340                          FACILITY:  [facility]   PHYSICIAN:  Jacqulyn Ducking, M.D.  DATE OF BIRTH:  1930-07-29   DATE OF CONSULTATION:  02/21/2004  DATE OF DISCHARGE:                                   CONSULTATION   REFERRING PHYSICIAN:  J. Sanjuana Kava, M.D.   PRIMARY CARE PHYSICIAN:  Dr. Sherrie Sport.   HISTORY OF PRESENT ILLNESS:  A 77 year old woman with a history of  hypertension, referred for assessment of supraventricular tachycardia.  Ms.  [name] has enjoyed generally good health.  She has hypertension that has  been well controlled with medical therapy.  She was evaluated by a  cardiologist a few years ago, who performed a number of tests, including a  stress test.  She was told that this was somewhat abnormal, and that cardiac  catheterization would be advisable.  After some negotiation, she accepted  treatment with long-acting nitrites.  After a number of years without  symptoms, she discontinued this medication.   [name] has been noted to be tachycardic on a number of occasions in the  hospital, since she underwent uncomplicated right total knee replacement  surgery.  An EKG earlier today demonstrated atrial flutter.  The patient was  asymptomatic at the time.  She has no history of palpitations.  She has had  no lightheadedness or syncope.   PAST MEDICAL HISTORY:  Notable for osteoarthritis, which has involved her  knees as well as her fingers.  She has had recurrent urethral stenosis  requiring dilatations.  She has a history of depression.   Past surgeries have included appendectomy, tonsillectomy, uterine and  bladder resuspension, and hysterectomy.   MEDICATIONS PRIOR TO ADMISSION:  1.  Lisinopril 5 mg q.d.  2.  Bumetanide 1 q.d.  3.  KCL 10 mEq q.d.  4.  Prozac 20 mg q.d.  5.  Voltaren 75 mg b.i.d.  6.  Calcium supplement  t.i.d.  7.  Aspirin 81 mg q.d.  8.  Iron and vitamin B.   Patient reports allergies to PENICILLIN and CHLOR-TRIMETON.   SOCIAL HISTORY:  Married and lives in [place] with her husband.  Has 13  children.  Denies the use of tobacco products or alcohol.   FAMILY HISTORY:  Mother and father lived to an old age.  Her mother had  congestive heart failure.  Her father had stable myocardial infarction.   REVIEW OF SYSTEMS:  Notable for intermittent sensations that she is febrile.  She notes occasional generalized weakness.  She has intermittent  constipation.  All other systems reviewed and are negative.   PHYSICAL EXAMINATION:  VITAL SIGNS:  Temperature 100.6, heart rate 80 and  regular, respirations 20, blood pressure 125/75.  GENERAL:  A pleasant, overweight woman in no acute distress.  HEENT:  Anicteric sclerae.  Normal lids and conjunctivae.  NECK:  No jugular venous distention.  Normal carotid upstrokes without  murmur.  LUNGS:  Grossly clear.  Patient is immobile, limiting exam.  CARDIAC:  Normal first and second heart sounds.  Modest basilar systolic  ejection murmur.  ABDOMEN:  Soft and nontender.  Normal bowel sounds.  SKIN:  No significant lesions.  ENDOCRINE:  No thyromegaly.  HEMATOPOIETIC:  No cervical, axillary, or inguinal adenopathy.  EXTREMITIES:  Benign surgical sites.  Normal distal pulses without edema.  MUSCULOSKELETAL:  Symmetrical strength and tone.   EKG:  Atrial flutter with variable AV conduction, generally 2:1.  Incomplete  left bundle branch block.  Poor R wave progression.   EKG following spontaneous conversion of arrhythmia:  Normal sinus rhythm.  Leftward axis.  IVCD.  Low voltage in the limb leads.  Shallow, anterior T  wave inversion.  Delayed R wave progression.   Other laboratories notable for mild anemia, potassium of 3.3.  Normal renal  function.  Normal hepatic function.  Negative cardiac markers.   IMPRESSION:  [name] presents with a reported  history of an abnormal  stress test and asymptomatic atrial flutter.  She has no symptoms at present  to suggest myocardial ischemia.  The frequency and duration of her  arrhythmia is unknown due to the fact that she is asymptomatic.  Risks for  thromboembolism is relatively low.  Her hypertension is a positive factor,  as is female sex and her relatively advanced age.  An echocardiogram and TSH  level are pending.  She has been started on beta blocker.  The dose will be  increased.  It may be possible to discontinue ACE inhibitor if the beta  blocker serves as an adequate antihypertensive medication.  She will require  Holter monitoring or an event monitor as an outpatient.  If a significant  quantity of atrial flutter or fibrillation is documented, long-term  anticoagulation will be appropriate.  Repeat assessment for possible  coronary disease is reasonable but mandatory.  This can certainly be  deferred until she has recovered nearly completely from her surgery, as can  assessment of her rhythm disorder.  For now, chronic anticoagulation is not  required.  We greatly appreciate the referral of this nice woman for  assessment and will be happy to follow her subsequent to hospital discharge.      RR/MEDQ  D:  02/21/2004  T:  02/21/2004  Job:  [id number]

## 2010-05-26 NOTE — H&P (Signed)
NAMEHANNIE, [name]             ACCOUNT NO.:  [id number]   MEDICAL RECORD NO.:  [id number]          PATIENT TYPE:  AMB   LOCATION:  DAY                           FACILITY:  [facility]   PHYSICIAN:  J. Sanjuana Kava, M.D. DATE OF BIRTH:  June 26, 1930   DATE OF ADMISSION:  DATE OF DISCHARGE:  LH                                HISTORY & PHYSICAL   CHIEF COMPLAINT:  Left knee pain.   HISTORY OF PRESENT ILLNESS:  The patient is a 77 year old female with severe  pain and tenderness in her left knee.  She has had pain and tenderness in  her knee for some time.  She had a right total knee by me done in February  2006.  She did well. We had rescheduled her earlier for this year for total  knee on the left; however, in the interim.  She developed low back pain  which was treated and then she developed significant abdominal pain, was  found to have ulcerative colitis.  Surgery was postponed and ulcerative  colitis was treated by Dr. Laural Golden.  She was followed for this for a period  of time by him and was found to be candidate to go ahead and proceed with  the total knee arthroplasty.  He has given her clearance for the procedure.  She was given blood through the [facility].  She is now prepared for the  procedure.  She understands the risks and imponderables of the procedure  having undergone total knee on the other side.  She has significant  degenerative joint disease of the left knee. The pain is not relieved by  treatment, crutches or walker.  She understands about infection, loosening,  embolization which would cause death, need for physical therapy,  autotransfusion of the blood and anesthesia.  She also understands the CPM  machine, Flow-trons and __________ therapy and getting the knee exercised  properly postoperatively.  She has attended the total joint classes at the  hospital.   The patient has a history of tachycardia after the other procedure but was  seen by cardiology and was treated  and this resolved spontaneously.  The  patient currently takes Lisinopril 10 mg daily, __________ 1 mg daily, Kay  Ciel 10 mEq 1 daily, Prozac 20 mg 1 daily, Voltaren 25 mg twice daily.  Vitamins.   ALLERGIES:  PENICILLIN AND A SINUS MEDICINE.   SOCIAL HISTORY:  The patient does not smoke or use alcoholic beverages.  She  is married and lives in [place].  Husband accompanies her to office visits.   PAST MEDICAL HISTORY:  The patient is status post appendectomy in 1942,  tonsillectomy in 1946.  Uterus and bladder surgery in 1975 and the total  knee on the right in February 2006.   FAMILY HISTORY:  Arthritis runs in the family.   PHYSICAL EXAMINATION:  VITAL SIGNS:  Within normal limits.  GENERAL:  Alert, cooperative and oriented.  HEENT:  Negative.  NECK:  Supple.  LUNGS:  Clear to auscultation.  HEART:  Regular without murmurs, rubs or gallops.  ABDOMEN:  Soft, nontender without masses.  EXTREMITIES:  Right knee, well healed surgical scar with excellent range of  motion of the right knee. Full extension, flexion to 120.  Knee is stable.  Left knee has degenerative joint disease, popping creaking snapping sounds,  crepitus.  She has an effusion.  She lacks full extension by approximately 4  or 5 degrees.  She has flexion to 90 or 95 degrees with pain.  Knee is  stable.  Other extremities are normal.  SKIN:  Intact.   IMPRESSION:  Severe degenerative joint disease of the left knee, status post  total knee on the right, doing well, ulcerative colitis recent history,  history of hypertension.   PLAN:  To proceed with total knee arthroplasty.  As stated, risks and  imponderables have discussed.  Labs are pending.                                            ______________________________  J. Sanjuana Kava, M.D.     JWK/MEDQ  D:  08/27/2005  T:  08/27/2005  Job:  [id number]

## 2010-05-26 NOTE — Discharge Summary (Signed)
[name], [name]             ACCOUNT NO.:  [id number]   MEDICAL RECORD NO.:  [id number]          PATIENT TYPE:  INP   LOCATION:  A303                          FACILITY:  [facility]   PHYSICIAN:  Delphina Cahill, M.D.        DATE OF BIRTH:  02-24-1930   DATE OF ADMISSION:  09/29/2005  DATE OF DISCHARGE:  09/27/2007LH                                 DISCHARGE SUMMARY   DISCHARGE DIAGNOSES:  1. Bacteremia/mild sepsis secondary to PICC line infection, Klebsiella      pneumonia resistant only to ampicillin.  2. Hypertension.  3. Nausea.  4. Ulcerative colitis.  5. Depression.  6. Hypokalemia.  7. Renal insufficiency.   DISCHARGE MEDICATIONS:  She will be discharged on Levaquin 500 mg p.o. daily  x10 days, Zofran 4 mg p.o. q. 6 hours p.r.n. for nausea.  Pain medicine will  be Vicodin p.r.n.  Apparently family is tapering this.  Prozac 20 mg p.o.  daily; Asacol 4 mg p.o. q.i.d. and Toprol 50 mg p.o. daily; KCL 20 mEq p.o.  daily.   BRIEF HISTORY:  Ms. [name] is a 77 year old who had knee replacement back in  August and developed cellulitis after that.  She had a PICC line placed and  was given vancomycin to treat this.  It appears that last two times that she  received vancomycin she developed shaking chills, fever greater than 102.  She had blood culture drawn from this when she came into the emergency  department which grew out Klebsiella pneumoniae.  At this time PICC line was  removed and she was started on IV antibiotics.   LABS OBTAINED DURING HOSPITALIZATION:  Initial CBC showed white count of  11.5, hemoglobin of 12.1.  Last CBC showed white count of 6.3, hemoglobin  10.2.  Initial C-met revealed BUN 24, creatinine of 2.3, total bili of 1.1,  alk phos 56, SGOT 30, SGPT 23, albumin mildly low at 3.3, D-dimer was  elevated 3.4.  Cardiac markers show CK MB of 1.3, troponin I 0.06, myoglobin  greater than 500.  UA revealed trace blood, trace protein.  Urine micro  showed 7 to 10 WBCs,  7-10 RBCs and many bacteria.  Sed rate was 48 on the  25th.  Blood cultures from the 22nd and 23rd still no growth to date.  The  last B-met shows sodium 142, potassium 2.5, chloride 107, CO2 26, glucose of  84, BUN 11, creatinine 1.9, calcium of 7.8.   IMAGES OBTAINED DURING HOSPITALIZATION:  Perfusion scan, showed normal exam,  no evidence of pulmonary emboli.  CT of head showed negative noncontrast  head CT.  Chest x-ray showed findings compatible with nonspecific ileus.  Chest x-ray showed mild cardiomegaly, no acute chest findings.   PHYSICAL EXAM UPON DISCHARGE:  VITAL SIGNS:  T max is 99.6 and T current is  98.4, blood pressures have been ranging between 150/77 up to 161/76, pulse  62, respirations are 20.  GENERAL:  This is a obese white female lying in bed in no acute distress.  Alert and oriented x3.  HEENT is unremarkable.  HEART:  Regular rate and rhythm.  No murmurs, gallops or rubs.  LUNGS:  Clear to auscultation bilaterally.  ABDOMEN:  Abdomen is protuberant, positive bowel sounds.  No significant  abdominal tenderness.  EXTREMITIES:  No lower extremity edema.  The PICC line has been removed and  has no signs of a significant infection around the site.  NEUROLOGIC:  Intact.  No deficits appreciated.   HOSPITAL COURSE PER PROBLEM:  Bacteremia and mild sepsis.  The patient had a  PICC line infection with gram-negative rods, turns out to be Klebsiella  pneumoniae.  Likely when she was given doses of vancomycin she was getting  seeded small doses of bacteria and was spiking temperature with this every  time.  She was admitted and PICC line was removed.  IV antibiotics were  continued and Dr. Luan Pulling did have a discussion with Dr. Orene Desanctis.  We feel that  we could transition to oral Levaquin and continue for a 10-day course as an  outpatient.   Nausea and decreased p.o. intake.  The patient does have history of  ulcerative colitis but no signs in the hospital.  Blood count did  slightly  drop but probably secondary to large amounts of fluid given.  Will treat  with outpatient Zofran p.r.n.  Encouraged to drink lots of fluids and eat  small amounts of food with each sitting, bland in nature.   Elevated creatinine. Unclear cause of initial bump in creatinine which has  been increased from August.  It may be secondary to mild dehydration from  decreased p.o. intake.  Repleting her fluids at this time.  Need to continue  to monitor this to make sure it is trending back down.  The creatinine at  time of discharge 1.8.   Hypokalemia.  Unclear exact cause of this.  Potassium on last B-met is 2.6  today and need to replete with multiple doses today and follow-up with a  recheck of this.   Depression.  Will continue with the Prozac for now.   DISPOSITION:  The patient will be discharged to home.  Continue on  antibiotics and will get home health set up to follow-up with the patient  and redraw another B-met and call results to on call doctor to see if  further repletion of her potassium is needed.  She will follow with Dr.  Luan Pulling the first part of next week and recheck her lab work.      Delphina Cahill, M.D.  Electronically Signed     ZH/MEDQ  D:  10/04/2005  T:  10/04/2005  Job:  [id number]

## 2010-05-26 NOTE — Group Therapy Note (Signed)
[name], [name]             ACCOUNT NO.:  [id number]   MEDICAL RECORD NO.:  [id number]          PATIENT TYPE:  INP   LOCATION:  A303                          FACILITY:  [facility]   PHYSICIAN:  Edward L. Luan Pulling, M.D.DATE OF BIRTH:  1930/09/02   DATE OF PROCEDURE:  10/02/2005  DATE OF DISCHARGE:                                   PROGRESS NOTE   [name] says she is feeling better.  She has been able to eat.  She  hungrier than she has been.  She remains febrile. Her T-max now 99.6, pulse  is 64, respirations 20, blood pressure 154/82, O2 sat is 96%.  Her chest is  much clearer.  She looks better in general.  We are awaiting her culture  report. She is growing gram-negative rods but the identification is not  completed yet.  Her PICC line has been removed because it was the PICC line  site that grew the gram-negative rods, and I am going to try to get another  PICC line in today.  Continue her other treatments, continue her  medications, see what Dr. Brooke Bonito input is.      Edward L. Luan Pulling, M.D.  Electronically Signed     ELH/MEDQ  D:  10/01/2005  T:  10/02/2005  Job:  [id number]

## 2010-05-26 NOTE — Procedures (Signed)
[name], [name]             ACCOUNT NO.:  [id number]   MEDICAL RECORD NO.:  [id number]          PATIENT TYPE:  INP   LOCATION:  A340                          FACILITY:  [facility]   PHYSICIAN:  Edward L. Luan Pulling, M.D.DATE OF BIRTH:  15-Dec-1930   DATE OF PROCEDURE:  02/21/2004  DATE OF DISCHARGE:                                EKG INTERPRETATION   The rhythm is what appears to be sinus rhythm with a rate in the 70s.  There  is left axis deviation.  There was a suggestion of left atrial enlargement.  Slow R wave progression across the precordium is seen.  There are  anterolateral ST-T wave abnormalities.  Abnormal electrocardiogram.      ELH/MEDQ  D:  02/21/2004  T:  02/21/2004  Job:  [id number]

## 2010-05-26 NOTE — Group Therapy Note (Signed)
[name], [name]             ACCOUNT NO.:  [id number]   MEDICAL RECORD NO.:  [id number]          PATIENT TYPE:  INP   LOCATION:  A340                          FACILITY:  [facility]   PHYSICIAN:  Karlyn Agee, M.D. DATE OF BIRTH:  [birth year]   DATE OF PROCEDURE:  DATE OF DISCHARGE:                                   PROGRESS NOTE   SUBJECTIVE:  Patient says she has no complaints.  Pain in her right knee is  much improved.  Slept relatively well last night.   OBJECTIVE:  VITALS:  Temperature is 100.9, respiration 18, pulse 79, blood  pressure 129/59.  CHEST:  Clear to auscultation bilaterally.  CARDIOVASCULAR SYSTEM:  Regular rhythm.  ABDOMEN:  Soft.  Nontender.  EXTREMITIES:  No calf tenderness in the left lower extremity.   Hemoglobin is 9.8, hematocrit 28.2, sodium 128, potassium 3.9, chloride 98,  CO2 23, BUN 20, creatinine 0.8, glucose 127.   IMPRESSION:  1.  Hypertension, controlled.  2.  History of depression, no exacerbation.  3.  Anemia.  Agree with transfusion of packed cells.  4.  Fever.  Probably an acute febrile reaction to surgery.  The patient did      get adequate antibiotic coverage over surgery.  Will continue to      monitor.      LC/MEDQ  D:  02/18/2004  T:  02/18/2004  Job:  [id number]

## 2010-05-26 NOTE — Group Therapy Note (Signed)
NAMEMARBELLA, [name]             ACCOUNT NO.:  [id number]   MEDICAL RECORD NO.:  [id number]          PATIENT TYPE:  INP   LOCATION:  A303                          FACILITY:  [facility]   PHYSICIAN:  Edward L. Luan Pulling, M.D.DATE OF BIRTH:  May 23, 1930   DATE OF PROCEDURE:  08/29/2005  DATE OF DISCHARGE:                                   PROGRESS NOTE   Patient of Dr. Luna Glasgow.   PROBLEMS:  1. Depression.  2. Status post knee replacement.  3. Ulcerative colitis.   SUBJECTIVE:  The patient says that she is feeling okay.  She is having some  pain related to her surgical site.   OBJECTIVE:  Her exam shows that her  temperature is 97.9; pulse 65;  respirations 20; blood pressure 126/55.  O2 saturation is 94% on 2 liters.  Her chest is clear.   ASSESSMENT:  She seems to be doing okay in general.   PLAN:  To continue with her treatments and follow.      Edward L. Luan Pulling, M.D.  Electronically Signed     ELH/MEDQ  D:  08/29/2005  T:  08/29/2005  Job:  [id number]

## 2010-05-26 NOTE — Consult Note (Signed)
[name], [name]             ACCOUNT NO.:  [id number]   MEDICAL RECORD NO.:  [id number]          PATIENT TYPE:  INP   LOCATION:  A327                          FACILITY:  [facility]   PHYSICIAN:  Hildred Laser, M.D.    DATE OF BIRTH:  26-Feb-1930   DATE OF CONSULTATION:  01/17/2005  DATE OF DISCHARGE:                                   CONSULTATION   REASON FOR CONSULTATION:  Diarrhea and heme positive stools.   HISTORY OF PRESENT ILLNESS:  The patient is a 77 year old Caucasian female  patient of Dr. Luan Pulling who was admitted today with gastroenteritis.  We have  been consulted regarding diarrhea with heme positive stool.  About 4 weeks  ago, the patient was treated for presumptive diverticulitis.  She had had a  CT of the abdomen and pelvis which revealed mild fatty liver, but otherwise  unremarkable.  She was given Cipro and Flagyl and did get better.  About a  week ago, she starting having diarrhea again.  It responded to Pepto-Bismol.  Three days ago, however, the diarrhea returned.  She is having increased  frequency of stools.  Stools are mushy.  No melena or rectal bleeding.  She  complains of abdominal cramps.  She woke up at 2 a.m. this morning with  severe diarrhea and had incontinence.  She complains of dizziness and  weakness.  She saw a little pink on the tissue, which she felt was due to  rectal irritation.  She complains of abdominal distention and nausea, but no  vomiting.  Afebrile at home.  Her temperature is 100.1 now.  She has only  had 1 stool since she came to the emergency department, and none since this  morning.  Stool studies are ordered, but she has not had any stool for  collection.   MEDICATIONS PRIOR TO ADMISSION:  1.  Toprol-XL 50 mg daily.  2.  Diclofenac 75 mg b.i.d.  3.  Prozac 20 mg daily.   ALLERGIES:  1.  PENICILLIN.  2.  P.O. DEMEROL caused rash.   PAST MEDICAL HISTORY:  1.  Hypertension.  2.  Chronic arthritis.  3.  Depression.  4.   Status post ECT.  5.  History of SVT postoperatively after her right knee replacement in      February of 2006.  She is scheduled to have her left knee replacement on      February 06, 2005.  6.  She has had an appendectomy.  7.  Partial hysterectomy.  8.  Bladder re-suspension.  9.  Urethral stenosis, status post multiple dilatations.  10. Tonsillectomy.  11. Right knee replacement in February of 2006.   FAMILY HISTORY:  Cousin died of renal carcinoma at age 38.  An aunt with  breast cancer.  No family history of colorectal cancer.  Her daughter had  some sort of female cancer, status post complete hysterectomy and doing  well.   SOCIAL HISTORY:  She is married and lives with her husband.  She has 3  children and 11 grandchildren.  She does not smoke or drink alcohol.   REVIEW  OF SYSTEMS:  See HPI for GI.  CONSTITUTIONAL:  No significant weight  loss.  She complains of a poor appetite.  Also denies any dysphagia,  odynophagia, and rarely has heartburn.  GENITOURINARY:  No dysuria.  CARDIOPULMONARY:  No chest pain or shortness of breath.   PHYSICAL EXAMINATION:  VITAL SIGNS:  Weight 235, height 62 inches,  temperature 100.1, pulse 93, respirations 20, blood pressure 105/80.  GENERAL:  Pleasant, obese, elderly Caucasian female in no acute distress.  SKIN:  Warm and dry, no jaundice.  HEENT:  Conjunctivae are pink.  Sclerae are non-icteric.  Oropharyngeal  mucosa are moist and pink.  No lesions, erythema or exudate.  No  lymphadenopathy or thyromegaly.  CHEST:  Clear to auscultation.  CARDIAC:  Regular rate and rhythm.  Normal S1 and S2.  No murmurs, rubs, or  gallops.  ABDOMEN:  Positive bowel sounds.  Obese but symmetrical.  Mild diffuse  abdominal tenderness to deep palpation.  No organomegaly or masses.  No  rebound tenderness or guarding.  No abdominal bruits or hernias.  EXTREMITIES:  No edema.   LABORATORY DATA:  White count 14,900, hemoglobin 12.9, hematocrit 37.1,   platelets 350,000.  Sodium 136, potassium 3.6, BUN 15, creatinine 1, glucose  134, total bilirubin 1, alkaline phosphatase 65, AST 20, ALT 21.  Albumin  3.7.  Urinalysis was essentially unremarkable.   IMPRESSION:  The patient is a 77 year old female with intermittent diarrhea  for approximately 3-4 weeks.  In the last 3 days, her diarrhea has been more  persistent and associated with abdominal cramping and nausea.  She has a  white count of 14,900, temperature of 100.1.  Stool was heme positive on  examination.  Would question the possibility of Clostridium difficile  colitis.  Not mentioned above, she had a colonoscopy about a year ago by Dr.  __________ and reports no significant findings.  She was told to return in 5  years.  The patient is on diclofenac; therefore, she has the possibility of  NSAID-induced colitis.  This may also explain the heme positive stools.   RECOMMENDATIONS:  1.  Agree with stool studies.  2.  CBC in the morning.  3.  Flagyl 250 mg p.o. t.i.d.  4.  Further recommendations to follow.      Neil Crouch, P.A.      Hildred Laser, M.D.  Electronically Signed    LL/MEDQ  D:  01/17/2005  T:  01/17/2005  Job:  [id number]

## 2010-05-26 NOTE — Group Therapy Note (Signed)
NAMEAFFIE, GASNER             ACCOUNT NO.:  [id number]   MEDICAL RECORD NO.:  [id number]          PATIENT TYPE:  INP   LOCATION:  A303                          FACILITY:  [facility]   PHYSICIAN:  Edward L. Luan Pulling, M.D.DATE OF BIRTH:  [birth year]   DATE OF PROCEDURE:  DATE OF DISCHARGE:                                   PROGRESS NOTE   Ms. Lacson says she is feeling a little better.  She did have some fever  last night, however.  She had a little bit of a cough.  She is somewhat  concerned about that.  She has had some cough and congestion this morning  too.  Her physical examination today shows that her chest is actually quite  clear now.  She said that earlier she apparently had some rhonchi, but now  it is very clear, but she has coughed up some sputum also.  Her heart is  regular.  Her abdomen is soft.  Dr. Luna Glasgow has removed one of the drains,  and she overall feels like she is improving.  Assessment then is that she is  better.  Plan is to continue her current treatment and medications with no  changes.  She is going to have some breathing treatments, which I think is  appropriate.      Edward L. Luan Pulling, M.D.  Electronically Signed     ELH/MEDQ  D:  08/30/2005  T:  08/30/2005  Job:  [id number]

## 2010-05-26 NOTE — Discharge Summary (Signed)
NAMEHARBOR, PASTER             ACCOUNT NO.:  [id number]   MEDICAL RECORD NO.:  [id number]          PATIENT TYPE:  INP   LOCATION:  A327                          FACILITY:  [facility]   PHYSICIAN:  Audria Nine, M.D.DATE OF BIRTH:  07-03-1930   DATE OF ADMISSION:  01/17/2005  DATE OF DISCHARGE:  01/14/2007LH                                 DISCHARGE SUMMARY   DISCHARGE DIAGNOSES:  1.  Diarrhea.  2.  Nonspecific colitis.   DISCHARGE MEDICATIONS:  1.  Flagyl 250 mg p.o. t.i.d. for 7 days.  2.  Prednisone taper by Dr. Laural Golden, the gastroenterologist.  3.  Diclofenac 75 mg p.o. b.i.d.  4.  Prozac 20 mg p.o. once a day.  5.  Bentyl 10 mg p.o. t.i.d. with meals as needed.   DIET:  Low-salt diet, heart healthy.   ACTIVITY:  As tolerated.   FOLLOWUP:  1.  The patient is to follow up with the primary care physician, Dr. Velvet Bathe in 2-3 weeks.  2.  Followup is to be scheduled by Dr. Laural Golden as needed.   HOSPITAL COURSE:  Mrs. Mcandrew is a 77 year old who had diarrhea about a  month ago and was treated for diverticulitis as an outpatient.  Kindly refer  to Dr. Luan Pulling' history and physical of January 17, 2005 for details.  The  patient's diarrhea slightly improved while taking Flagyl and Cipro.  It  recurred again 3 days prior to admission.  The patient was seen in the  emergency room, and her white blood cell count was noted to be 14,900.  The  patient was admitted with concern for Clostridium difficile diarrhea.   During the course of hospitalization, the patient continued to do fairly  well.  She was hydrated.  Her white blood cell count also returned to  normal.  She was started empirically on Flagyl 250 mg p.o. t.i.d.   The patient was seen by Dr. Laural Golden, who recommended a colonoscopy.  The  colonoscopy performed revealed only nonspecific colitis, for which he  recommended prednisone.   The patient's diarrhea had completely resolved on January 21, 2005.  A  consultation was obtained,  Dr. Laural Golden of gastroenterology.   PROCEDURE PERFORMED:  Colonoscopy January 19, 2005.  The results revealed  nonspecific colitis.   PERTINENT LABORATORY DATA:  White blood cell count was 14.9, hemoglobin  12.9, platelets of 315, neutrophils of 81%, sodium 136, potassium 3.6, CO2  24, glucose 134, BUN 15, creatinine 1.  Liver function tests were normal.  Stools were heme positive.   DISPOSITION:  The patient has been discharged home.  Dr. Laural Golden will plan  her steroid taper.      Audria Nine, M.D.  Electronically Signed     AM/MEDQ  D:  01/21/2005  T:  01/21/2005  Job:  [id number]

## 2010-05-26 NOTE — Group Therapy Note (Signed)
[name], [name]             ACCOUNT NO.:  [id number]   MEDICAL RECORD NO.:  [id number]          PATIENT TYPE:  INP   LOCATION:  A303                          FACILITY:  [facility]   PHYSICIAN:  Edward L. Luan Pulling, M.D.DATE OF BIRTH:  09/04/1930   DATE OF PROCEDURE:  10/03/2005  DATE OF DISCHARGE:  10/04/2005                                   PROGRESS NOTE   SUBJECTIVE:  [name] says she is doing better.  She feels better but she  is still weak.  Her potassium level is low at 2.5.  Her temperature 99.6,  pulse 59, respirations 20, blood pressure 146/59, O2 sats 98%.  Chest is  clear.  She is eating a little bit.   ASSESSMENT:  She is much improved.   PLAN:  I am going to wait and see how she does with food today.  Her BUN is  down 11, creatinine 1.9, potassium is 2.5, so we are going to replace her  potassium and see how she does.  She may be able to be discharged if she  does well with food and does not get nauseated.      Edward L. Luan Pulling, M.D.  Electronically Signed     ELH/MEDQ  D:  10/03/2005  T:  10/04/2005  Job:  [id number]

## 2010-05-26 NOTE — H&P (Signed)
NAMEMINAMI, ARRIAGA             ACCOUNT NO.:  [id number]   MEDICAL RECORD NO.:  [id number]          PATIENT TYPE:  AMB   LOCATION:  DAY                           FACILITY:  [facility]   PHYSICIAN:  J. Sanjuana Kava, M.D. DATE OF BIRTH:  November 27, 1930   DATE OF ADMISSION:  DATE OF DISCHARGE:  LH                                HISTORY & PHYSICAL   CHIEF COMPLAINT:  Right knee pain.   HISTORY OF PRESENT ILLNESS:  Patient is a 77 year old female with severe  pain and tenderness in her right knee.  She has had pain and tenderness in  the knee for some time.  She originally grew up in the [place] area and then moved to [place], [place] for over 35 years.  In the  past year, she moved to [place].  She has had trouble with her right knee off  and on for many years and states she has a trick knee.  She was scheduled  for a total knee last year in [place] but at the last minute decided to  delay the surgery because she knew she was moving.  Her sister recently had  a total knee by me, and she decided to have a total knee herself.  She  elected to have it done.  She says due to the giving way of her knee, pain,  and tenderness, she has to use a walker to get about.  She has pain at rest  and pain with ambulation.  Conservative treatments have not been successful.  X-rays show severe and significant degenerative joint disease of the knee.  I have talked to her on several occasions concerning this.  I first saw her  in my office on January 12th and subsequently January 26th.  She has had  medical clearance from Dr. Sherrie Sport saying she is able to undergo the  procedure.  I have gone over the procedure, risks, and imponderables with  her, including infection, loosening, embolization, which could cause death,  the need for physical therapy, autotransfusion of blood, which she has  already given 2 units, spinal anesthesia.  I have also explained the CPM  machine, Flowtron, enoxaparin  therapy, trying to get the knee full extended,  and she lacks full extension of her knee currently.  She appears to  understand the risks and imponderables.   PAST MEDICAL HISTORY:  She denies heart disease, lung disease, kidney  disease, stroke process, weakness.  She has hypertension.  She denies  diabetes, TB, rheumatic fever, cancer, polio, ulcer disease, circulatory  problems.   ALLERGIES:  She is allergic to PENICILLIN and SINUS MEDICINE.   CURRENT MEDICATIONS:  1.  Lisinopril 10 daily.  2.  Bumex 1 daily.  3.  KCL 10 mg daily.  4.  Prozac 20 mg daily.  5.  Voltaren 75 mg b.i.d.  6.  Calcium 500 t.i.d.  7.  Aspirin 81 mg daily.   Patient does not smoke or drink.   She is status post appendectomy in 1942, tonsillectomy in 1946, uterus and  bladder surgery in 1975.   Arthritis runs in  the family.  She is married and now lives in [place].   PHYSICAL EXAMINATION:  VITAL SIGNS:  BP 116/70, pulse 64, respiratory rate  18, afebrile.  Height 5 foot 4.  Weight 215.  HEENT:  Negative.  NECK:  Supple.  LUNGS:  Clear to P&A.  HEART:  Regular without murmur heard.  ABDOMEN:  Soft and nontender without masses.  EXTREMITIES:  Marked pain and tenderness of the right knee.  Range of motion  6 degrees to 95 degrees.  She lacks full extension.  She has some popping  and crepitus of the knee and effusion.  Neurovascularly intact.  Left knee  has range of motion from 0 to 95 with crepitus.  Other extremity within  normal limits.  CNS:  Negative.  SKIN:  Intact.   IMPRESSION:  1.  Significant and severe degenerative joint disease, right knee.  2.  Hypertension.   PLAN:  Total knee arthroplasty on the right.  Again, I have explained the  risks and imponderables.  Labs are pending.  Patient appears to understand  procedure and has asked questions before surgery.      JWK/MEDQ  D:  02/16/2004  T:  02/16/2004  Job:  [id number]

## 2010-05-26 NOTE — Discharge Summary (Signed)
[name], [name]             ACCOUNT NO.:  [id number]   MEDICAL RECORD NO.:  [id number]          PATIENT TYPE:  INP   LOCATION:  A303                          FACILITY:  [facility]   PHYSICIAN:  J. Sanjuana Kava, [name].D. DATE OF BIRTH:  02-Jan-1931   DATE OF ADMISSION:  08/28/2005  DATE OF DISCHARGE:  08/25/2007LH                                 DISCHARGE SUMMARY   DISCHARGE SUMMARY:  Significant degenerative joint disease, left knee.   PROCEDURES:  Total knee arthroplasty on the left.   CONDITION ON DISCHARGE:  Discharge status improved.   PROGNOSIS:  Good.  The patient will be seen by Home Health, physical  therapy.   DISCHARGE MEDICATIONS:  1. Vicodin ES.  2. Levaquin 500 mg one p.o. daily.  3. __________ 40 mg subcu daily by the home health nurse.  4. Toprol 50 mg p.o. daily.  5. Prozac 20 mg p.o. daily.  6. Asarol 400 mg p.o. q.i.d.   FOLLOWUP:  The patient will be seen in my office in one month.  The patient  is to see [facility] therapy.   HOSPITAL COURSE:  The patient was admitted for total knee arthroplasty on  the left, she tolerated it well.  She is followed in the hospital by Dr.  Luan Pulling.  Day #1, she was afebrile, pain was controlled with PCA.  Second  day, she was up moving about.  Her hemoglobin was 10.4, maximum temperature  100.7.  Hemovac was removed.  The wound looked good.  The patient continued  to be seen by physical therapy, and on postop day, max temperature was 99.4,  wound looked good.  Arrangements were made for Home Health.  Discontinued  her IV and began p.o. medications.  The following day, she was doing well.  Temperature maximum was 99.  She had a considerable amount of gas, and she  was distended.  She was seen by Dr. Laural Golden because she has a history of  ulcerative colitis, and he saw her for this.  Discharged to home with Home  Health arranged.  The patient's lab values were, otherwise, within normal  limits.  She  received blood after surgery.  Wound was good at time of  discharge.  Wound care has been explained.  She is to contact the office  through the [facility] or through the office.                                            ______________________________  J. Sanjuana Kava, [name].D.    JWK/MEDQ  D:  09/19/2005  T:  09/20/2005  Job:  [id number]

## 2010-05-26 NOTE — H&P (Signed)
[name], [name]             ACCOUNT NO.:  [id number]   MEDICAL RECORD NO.:  [id number]          PATIENT TYPE:  INP   LOCATION:  A303                          FACILITY:  [facility]   PHYSICIAN:  Edward L. Luan Pulling, M.D.DATE OF BIRTH:  October 24, 1930   DATE OF ADMISSION:  09/29/2005  DATE OF DISCHARGE:  LH                                HISTORY & PHYSICAL   Ms. Castner is a 77 year old who had a knee replacement in August and then  had a cellulitis after that.  She has been taking vancomycin and the last 2  times that she has taken the vancomycin she has developed what sounds like  shaking chills and has had fever to 102.  She has not been eating well for  the last several days.  Dr. Luna Glasgow has been treating her at home with  vancomycin. She has a PICC line in place.   PAST MEDICAL HISTORY:  Positive for ulcerative colitis, appendectomy,  tonsillectomy, uterus and bladder surgery, right total knee in February  2006.  She has a history of depression and received ECT. She has a history  of hypertension for which she has been taking lisinopril and she takes  Prozac at home.  She is allergic to PENICILLIN and SOME SORT OF SINUS  MEDICATION, she is not sure what it is.   SOCIAL HISTORY:  She is married, lives at home with her husband.  She does  not smoke.  She does not drink any alcohol.   FAMILY HISTORY:  Positive for arthritis.   PHYSICAL EXAMINATION:  GENERAL:  Shows an obese female who does not appear  to be in any acute distress now.  She is alert, oriented.  VITAL SIGNS:  Her temperature was 102 in the emergency room, her blood  pressure 120/70, pulse is 80.  HEENT:  She appears to be dry.  Her mucous membranes are dry.  Her pupils do  react to light and accommodation.  Tympanic membranes are intact.  NECK:  Her neck is supple.  She does not have any jugular venous distension.  CHEST:  Her chest is clear without wheezes.  HEART:  Her heart is regular without murmur, gallop or rub.  ABDOMEN:  Soft.  No masses are felt.  EXTREMITIES:  Her knee shows a healing scar. It does not appear to be  fluctuant. There does not appear to be an infection in the knee itself and  her lower leg also does not seem to show cellulitis now which had been the  problem in the past.   LABORATORY DATA:  Her lab work shows CBC with white count 11,500, hemoglobin  12.1, platelets 326. Her metabolic profile shows a potassium is 3.5, BUN is  24, creatinine 2.3, albumin 3.3, the rest normal with the exception of a  glucose of 100, a nonfasting specimen. Blood cultures are of course pending.  Her D-dimer was 3.4.  She did have a ventilation perfusion lung scan that is  negative.  Cardiac markers, myoglobin 500, troponin 0.06 but MB is only 1.3.  She has not had any chest pain.  Urinalysis shows  trace blood, microscopic 7-  10 white cells, 7-10 red cells, many bacteria.   ASSESSMENT/PLAN:  She has fever from somewhere. This has been at least  coincidental with her having her PICC line accessed so I would like to try  to get another IV site and give her IV medications that way until we know  that the PICC line is clear.  We are going to go ahead and try to get a  culture off of the PICC line at this point, check a urine culture, blood  cultures, put her on Levaquin and Flagyl until we can get a better sense of  exactly what is going on. I want to continue with her Prozac, consult Dr.  Luna Glasgow whose been treating her as cellulitis. [facility] standing orders. Will  try to replenish her fluids, recheck her labs tomorrow.      Edward L. Luan Pulling, M.D.  Electronically Signed     ELH/MEDQ  D:  09/29/2005  T:  10/01/2005  Job:  [id number]

## 2010-05-26 NOTE — Procedures (Signed)
NAMETASHAWN, GREFF             ACCOUNT NO.:  [id number]   MEDICAL RECORD NO.:  [id number]          PATIENT TYPE:  INP   LOCATION:  A340                          FACILITY:  [facility]   PHYSICIAN:  Edward L. Luan Pulling, M.D.DATE OF BIRTH:  [birth year]   DATE OF PROCEDURE:  02/21/2004  DATE OF DISCHARGE:                                EKG INTERPRETATION   The rhythm appears to be a supraventricular tachycardia, and there are areas  where it looks like it might be an atrial flutter.  There is an incomplete  left bundle-branch block.  Abnormalities electrocardiogram.      ELH/MEDQ  D:  02/21/2004  T:  02/21/2004  Job:  [id number]

## 2010-05-26 NOTE — Consult Note (Signed)
NAMELABRITTANY, WECHTER             ACCOUNT NO.:  [id number]   MEDICAL RECORD NO.:  [id number]          PATIENT TYPE:  INP   LOCATION:  A340                          FACILITY:  [facility]   PHYSICIAN:  Karlyn Agee, M.D. DATE OF BIRTH:  Sep 19, 1930   DATE OF CONSULTATION:  02/17/2004  DATE OF DISCHARGE:                                   CONSULTATION   PHYSICIAN REQUESTING THE CONSULT:  Dr. Sanjuana Kava.   PRIMARY CARE PHYSICIAN:  Dr. Stoney Bang.   CONSULTING PHYSICIAN:  Dr. Karlyn Agee.   REASON FOR CONSULTATION:  An elderly, hypertensive patient, status post  right knee replacement.   HISTORY OF PRESENT ILLNESS:  This is a 77 year old Caucasian lady with a  history of chronic severe bilateral knee pains, who was electively admitted  and had replacements of the right knee this morning.  The patient's only  current complaints are related to her surgery.  She is having some pain in  the knee, and she is feeling a little nauseous from the effects of the  morphine.  She is having no fever, chest pain, or shortness of breath.  No  weakness or dizziness.  She did donate blood 2 weeks prior to surgery and  received a transfusion of 1 unit of packed cells postoperatively.   PAST MEDICAL HISTORY:  1.  Hypertension.  2.  A history of positive cardiac stress test in 2002.  The patient refused      a cardiac catheterization at that time and was treated conservatively.  3.  Depression.  4.  Chronic arthritis.  5.  A history of urethral stenosis with repeated dilations.  The last      dilation was 7 years ago.   PAST SURGICAL HISTORY:  1.  Status post hysterectomy and bladder tack 30 years ago.  2.  Status post tonsillectomy, age 6.  41.  Status post appendectomy, age 75.  37.  Status post recurrent urethral dilations, last episode 7 years ago.   MEDICATIONS:  1.  Lisinopril 5 mg daily.  2.  Bumex 1 mg daily.  3.  KCl  10 mEq daily.  4.  Prozac 20 mEq daily.  5.  Voltaren 75 mg  twice daily.  6.  Calcium 3 times daily.  She had not taken any for the past month.  7.  Aspirin 81 mg daily.  8.  Iron tablets twice daily.  9.  Vitamin B daily.   MEDICATIONS IN HOSPITAL:  1.  Lisinopril 10 mg daily.  2.  Bumex 1 mg daily.  3.  Prozac 20 mEq daily.  4.  Calcium 2 times daily.  5.  Cefazolin Q8H x 24 hrs.  6.  Lovenox 62m Q12h.  7.  Morphine PCA pump.  8.  Zofran iv q6h prn.  9.  Phenergan iv q6h prn.  10. Diphenhydramin po/iv q6h prn.  11. Reglan iv q6h prn.   ALLERGIES:  1.  PENICILLIN.  2.  CHLOR-TRIMETON.   SOCIAL HISTORY:  She has been married for over 535years.  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  She denies  any history of tobacco, alcohol or  illicit drug use.   FAMILY HISTORY:  Significant for a very ill, 72 year old mother, bedridden  with arthritis and heart failure, a father who died at age 37 of an acute  MI, a family history of diabetes.  She has eight siblings whose only medical  problems are arthritis.  Of her three children, her eldest daughter and son  both have hypertension.  The eldest daughter also has diabetes, and she is  status post hysterectomy for cancer of the uterus.  Her youngest daughter is  healthy.   REVIEW OF SYSTEMS:  On a 10-point review of systems, the patient admits to  cataract surgery pending, recurrent inner ear problems, occasional  sinusitis, occasional constipation, status post a normal colonoscopy by Dr.  Rowe Pavy 2 months ago.  No current genitourinary problems with a past history  of frequent urethral strictures and difficulty passing urine.  Arthritis was  noted in the admission history and physical.  Depression as noted earlier in  the admission history and physical.   PHYSICAL EXAMINATION:  GENERAL APPEARANCE:  This is a very pleasant,  somewhat obese, elderly Caucasian lady lying in bed.  VITAL SIGNS:  Temperature 98.2, pulse 69, respirations 20, blood pressure  144/70.  She has no jugular  venous distention.  No lymphadenopathy.  Her  mucous membranes are pink and anicteric.  CHEST:  Clear to auscultation bilaterally.  CARDIOVASCULAR:  Regular rhythm.  ABDOMEN:  Obese, soft and nontender.  EXTREMITIES:  Her right lower extremity is in CryoCuff.  She has Flowtrons  on both legs.  She has no edema and has 3+ pulses of the left lower  extremity.  CNS:  Exam was limited by her right knee surgery but was intact, and no  focal neurological deficits were elicited.   LABORATORY:  On her labs on the 7th, 2 days prior to admission, her  hemoglobin was 11, hematocrit 31.9, and her white count was 7.  She had a  normal differential on her white count, and her platelets were 346.  Her INR  was 0.9.  Her sodium was 133, potassium 4.3, chloride 104, CO2 24, glucose  82, BUN 27, creatinine 0.7.  Her liver function tests were completely  normal.  Her urinalysis was unremarkable.  Labs are pending later this  afternoon.   ASSESSMENT:  1.  Hypertension, controlled.  2.  Status post right knee replacement.  3.  Right knee pain, appropriate for the degree of surgery.   RECOMMENDATIONS:  1.  Continue current medical regimen.  2.  Watch out for dehydration.  3.  Would switch to normal saline rather than D5 1/2 normal saline for IV      fluid supplementation, particularly in light of her diuretic use.  4.  Increase to maximum strength Morphine regimen.      LC/MEDQ  D:  02/17/2004  T:  02/17/2004  Job:  [id number]

## 2010-05-26 NOTE — H&P (Signed)
NAMEJAYLEA, [name]             ACCOUNT NO.:  [id number]   MEDICAL RECORD NO.:  [id number]          PATIENT TYPE:  OBV   LOCATION:  A303                          FACILITY:  [facility]   PHYSICIAN:  J. Sanjuana Kava, M.D. DATE OF BIRTH:  03-22-1930   DATE OF ADMISSION:  DATE OF DISCHARGE:  08/30/2007LH                                HISTORY & PHYSICAL   CHIEF COMPLAINT:  My knee is swollen.   HISTORY AND PHYSICAL:  The patient had a total knee by me, Tuesday, August 28, 2005.  She has developed a slight temperature and some redness around  the wound.  Temperature is 99.6.  There is no purulent discharge, but there  is some redness around the wound.  I am very concerned that she could be  having superficial infection.  I would like to have a PICC line inserted.  The PICC line can only be done as an outpatient procedure and has to be at  [facility] or [facility] and has to be scheduled.  A holiday weekend is  approaching.  Home health will not start IV vancomycin without a PICC line  for over 5 days.  Today is Thursday before a holiday.  They cannot give the  antibiotics if I start it today to last through Tuesday until the next  available slot for the PICC line insertion.  I have called down to the team  at [facility] and [facility] and they will not be able to see her today and  it will probably be late tomorrow if they can get to her.  The only other  way it can be done if is she is admitted to the hospital on a Thursday.  Today is Thursday.  I am going to admit her to the hospital, get the PICC  line inserted and discharge her home to [facility] for the antibiotics.  I  will go ahead and make the arrangements.  The patient is admitted.   INTERIM HISTORY AND PHYSICAL:  She was in the hospital last week.  The rest  is unchanged except for her physical exam.   PHYSICAL EXAMINATION:  VITAL SIGNS:  Temperature is 99.6, vital signs are  stable.  HEENT:  Negative.  NECK:  Supple.  LUNGS:   Clear to percussion and auscultation.  HEART:  Regular rate and rhythm without murmur.  ABDOMEN:  Soft, nontender without masses.  EXTREMITIES:  The left knee has recent incision with staples.  There is some  slight redness around the wound, but there is no drainage.  There is no  purulence.  Range of motion is very from 0 to 95 degrees, but it is tender.  She is in a walker.  The right knee is status post total knee and has full  range of motion.  No other joints are involved.  SKIN:  Intact.  Otherwise negative except as noted.   IMPRESSION:  Status post recent total knee with possible early cellulitis.  I want to avoid a deep infection.   PLAN:  I will begin a PICC line for IV vancomycin access as  an outpatient.                                            ______________________________  Lenna Sciara. Sanjuana Kava, M.D.     JWK/MEDQ  D:  09/06/2005  T:  09/06/2005  Job:  [id number]

## 2010-05-26 NOTE — Procedures (Signed)
[name], [name]             ACCOUNT NO.:  [id number]   MEDICAL RECORD NO.:  [id number]          PATIENT TYPE:  INP   LOCATION:  A340                          FACILITY:  [facility]   PHYSICIAN:  Jacqulyn Ducking, M.D.  DATE OF BIRTH:  06-29-1930   DATE OF PROCEDURE:  02/21/2004  DATE OF DISCHARGE:                                  ECHOCARDIOGRAM   REFERRING PHYSICIAN:  Dr. Luna Glasgow, Dr. Megan Salon.   CLINICAL DATA:  A 77 year old woman with fever and edema following a right  knee replacement.   M-MODE:  Aorta 3.2, left atrium 5.2, septum 1.4, posterior wall 1.2, LV  diastole 5.1, LV systole 4.0.   RESULTS:  1.  A technically suboptimal but adequate echocardiographic study.  2.  Mild to moderate left atrial enlargement; normal right atrial size.      Normal right ventricular size and function; mild capital RVH.  3.  Minimal aortic valvular sclerosis; mild annular calcification.  4.  Normal tricuspid and pulmonic valve; normal proximal pulmonary artery.  5.  Normal mitral valve; mild annular calcification; trivial regurgitation.  6.  Left ventricular size at the upper limit of normal; no significant LVH.      Low normal regional and global LV systolic function.  7.  Normal IVC.   INDICATIONS:  Per thanks      RR/MEDQ  D:  02/21/2004  T:  02/21/2004  Job:  [id number]

## 2010-05-26 NOTE — Assessment & Plan Note (Signed)
[facility]                                 ON-CALL NOTE   NAME:BOWLESSharnise, Blough                    MRN:          [id number]  DATE:03/01/2009                            DOB:          September 21, 1930    I was contacted by [facility] regarding Ms. [name].  They contacted me because of a 48-hour Holter monitor which was  recorded on February 22, 2009.  This was reported to be an episode of  second-degree AV block, Mobitz type 2.  Because of this, I asked them to  fax the EKG to me.  I have reviewed the fax and the EKG and Holter  monitor which reveals predominantly sinus rhythm with a minimal heart  rate of 41-92.  I have reviewed the tracing in question that was read as  second-degree AV block.  This is from 4:30 p.m. in the afternoon.  This  is not definitively second-degree AV block.  This appears to be slow  atrial fibrillation with approximately a 2.6-second pause.  There is a  poor baseline and I cannot definitively make out P-waves.  There are  some appearances of what might be P-waves; however, they do not march  out at regular intervals.  Because of that, I think this is more  consistent with atrial fibrillation with a slow ventricular response.  I  attempted to contact the patient at her home # [phone].  Unfortunately, there was no answer.  I asked her to give [place] a call back  at [facility] to discuss further.  I will leave a message with  her primary cardiologist, Dr. Domenic Polite, so that further contact can be  made.     Crissie Reese, MD    SJT/MedQ  DD: 03/01/2009  DT: 03/02/2009  Job #: [id number]

## 2010-05-26 NOTE — Group Therapy Note (Signed)
NAMEJAYCE, BOYKO             ACCOUNT NO.:  [id number]   MEDICAL RECORD NO.:  [id number]          PATIENT TYPE:  INP   LOCATION:  A327                          FACILITY:  [facility]   PHYSICIAN:  Audria Nine, M.D.DATE OF BIRTH:  04/17/1930   DATE OF PROCEDURE:  01/20/2005  DATE OF DISCHARGE:                                   PROGRESS NOTE   SUBJECTIVE:  The patient feels much better today. Her diarrhea episodes have  actually decreased. She is currently on empiric therapy for Clostridium  difficile. The patient had a colonoscopy done yesterday and I discussed the  results with Dr. Laural Golden. He thinks that it is nonspecific colitis. He  subsequently recommended prednisone.   OBJECTIVE:  She is alert, comfortable, very pleasant. In no acute distress.  LUNGS:  Clear clinically.  HEART:  S1 and S2 regular.  ABDOMEN:  Obese, soft, nontender. Bowel sounds positive. No masses palpable.   LABORATORY DATA:  White blood cell count 13.8. Hemoglobin and hematocrit of  10.4 and 30. Platelet count of 306,000. Sodium 137, potassium 4.1, chloride  of 109, CO2 of 26, BUN of 4, creatinine 0.8. The calcium was 8.1.   C. diff toxins were negative.   ASSESSMENT:  Diarrhea secondary to nonspecific colitis. Dr. Laural Golden, the  gastroenterologist, has recommended prednisone and Bentyl. Will also  continue empiric therapy with metronidazole 250 mg p.o. t.i.d.   DISPOSITION:  I will expect the patient would likely be discharged home in  the morning. Dr. Laural Golden will plan her prednisone taper.      Audria Nine, M.D.  Electronically Signed     AM/MEDQ  D:  01/20/2005  T:  01/20/2005  Job:  [id number]

## 2010-05-26 NOTE — Group Therapy Note (Signed)
[name], [name]             ACCOUNT NO.:  [id number]   MEDICAL RECORD NO.:  [id number]          PATIENT TYPE:  INP   LOCATION:  A303                          FACILITY:  [facility]   PHYSICIAN:  Edward L. Luan Pulling, M.D.DATE OF BIRTH:  May 14, 1930   DATE OF PROCEDURE:  09/30/2005  DATE OF DISCHARGE:                                   PROGRESS NOTE   [name] feels a little better.  She still does not have much  appetite.  She has not had any more fever.  T-max 100.5.  She is now 98.7,  pulse 62, respirations 18, blood pressure 146/80, O2 saturations 96%.  Her  chest is clear.   White count is down to 6700, hemoglobin 11.6, platelets 297, BUN is 22,  creatinine 2.2.  She has gram-negative rods in her blood culture that was  done from the PICC line. I am going to go ahead and discontinue the PICC  line and ask for another one to be placed tomorrow.  We will get another  culture through the PICC line and then repeat.  I am going to leave her on  her current antibiotics for now until we have a better idea of what she is  growing. She has not had any more chills, so I am hopeful that we are at  least covering the organism until we get formal identification.      Edward L. Luan Pulling, M.D.  Electronically Signed     ELH/MEDQ  D:  09/30/2005  T:  10/02/2005  Job:  [id number]

## 2010-09-04 ENCOUNTER — Encounter (INDEPENDENT_AMBULATORY_CARE_PROVIDER_SITE_OTHER): Payer: Self-pay | Admitting: *Deleted

## 2010-09-28 ENCOUNTER — Encounter: Payer: Self-pay | Admitting: Cardiology

## 2010-10-02 ENCOUNTER — Ambulatory Visit (INDEPENDENT_AMBULATORY_CARE_PROVIDER_SITE_OTHER): Payer: Medicare Other | Admitting: Internal Medicine

## 2010-10-02 ENCOUNTER — Encounter (INDEPENDENT_AMBULATORY_CARE_PROVIDER_SITE_OTHER): Payer: Self-pay | Admitting: Internal Medicine

## 2010-10-02 DIAGNOSIS — K5190 Ulcerative colitis, unspecified, without complications: Secondary | ICD-10-CM

## 2010-10-02 NOTE — Progress Notes (Signed)
Subjective:     Patient ID: [name], female   DOB: 18-Apr-1930, 77 y.o.   MRN: [id number]  HPI  [name] is a 77 yr old female here today for f/u of her UC.  She has only had to use the Hyoscyamine  x 2 since her last visit.  She does occasionally have to go to the BR a lot. This has only happened twice in the past 6 months.  She is having a BM daily Her stools are sometimes hard and she will become constipated. She takes Glycerin supp for this.  Appetite is good. No weight loss.  No bright red rectal bleeding or melena.  No pain with defecation.  Her last colonoscopy was 10/2008 for surveillance of her UC which revealed normal except for a single papill and external hemorrhoids.  UC remains in remission.  She was diagnosed in 2007 with UC.    Review of Systems  See hpi     Past Surgical History  Procedure Date  . Total knee arthroplasty   . Appendectomy   . Tonsillectomy   . Abdominal hysterectomy     Patient still has ovaries  . Bladder surgery    . Past Medical History  Diagnosis Date  . Atrial flutter   . Depression   . Essential hypertension, benign   . Arthritis   . Ulcerative colitis    Current Outpatient Prescriptions  Medication Sig Dispense Refill  . aspirin 81 MG tablet Take 81 mg by mouth daily.        . capsaicin (ARTHRITIS PAIN RELIEF) 0.025 % cream Apply topically as needed.        . Coenzyme Q10 (COQ-10) 100 MG CAPS Take one by mouth daily        . FLUoxetine (PROZAC) 20 MG capsule Take 20 mg by mouth daily.        . hyoscyamine (LEVSIN, ANASPAZ) 0.125 MG tablet Take 0.125 mg by mouth every 4 (four) hours as needed.        Marland Kitchen L-Lysine 500 MG CAPS Take by mouth.        . mesalamine (ASACOL) 400 MG EC tablet Take 2 by mouth twice daily        . metoprolol succinate (TOPROL-XL) 25 MG 24 hr tablet Take 1 tablet (25 mg total) by mouth daily.  30 tablet  6  . Multiple Vitamin (MULTIVITAMIN) tablet Take 1 tablet by mouth daily.        . Ibuprofen (ADVIL) 200 MG CAPS  As needed         Allergies  Allergen Reactions  . Penicillins     REACTION: rash, years ago  . Demerol Rash    Objective:   Physical Exam  Filed Vitals:   10/02/10 1200  BP: 152/80  Pulse: 72  Temp: 98.4 F (36.9 C)  Height: 5' 4.6" (1.641 m)  Weight: 246 lb 8 oz (111.812 kg)   Alert and oriented. Skin warm and dry. Oral mucosa is moist.  Upper dentures. Lower natural in good conditon. Sclera anicteric, conjunctivae is pink. Thyroid not enlarged. No cervical lymphadenopathy. Lungs clear. Heart regular rate and rhythm.  Abdomen is soft. Bowel sounds are positive. No hepatomegaly. No abdominal masses felt. No tenderness.  No edema to lower extremities. Patient is alert and oriented.      Assessment:     UC. She appears to be in remission.  Will get lab work from Dr. Nadara Mustard.     Plan:  OV in 6 months.

## 2010-11-16 ENCOUNTER — Ambulatory Visit: Payer: Medicare Other | Admitting: Cardiology

## 2010-11-20 ENCOUNTER — Encounter: Payer: Self-pay | Admitting: Cardiology

## 2010-11-20 ENCOUNTER — Ambulatory Visit: Payer: Medicare Other | Admitting: Cardiology

## 2010-11-21 ENCOUNTER — Encounter: Payer: Self-pay | Admitting: Cardiology

## 2010-11-21 ENCOUNTER — Ambulatory Visit (INDEPENDENT_AMBULATORY_CARE_PROVIDER_SITE_OTHER): Payer: Medicare Other | Admitting: Cardiology

## 2010-11-21 VITALS — BP 144/84 | HR 64 | Ht 64.0 in | Wt 245.0 lb

## 2010-11-21 DIAGNOSIS — I4892 Unspecified atrial flutter: Secondary | ICD-10-CM

## 2010-11-21 DIAGNOSIS — I10 Essential (primary) hypertension: Secondary | ICD-10-CM

## 2010-11-21 MED ORDER — METOPROLOL SUCCINATE ER 25 MG PO TB24: 25.0000 mg | ORAL_TABLET | Freq: Every day | ORAL | Status: DC

## 2010-11-21 NOTE — Assessment & Plan Note (Signed)
Patient continues to check blood pressure at home, states that her systolics are almost always under 140. No changes made today to medical regimen.

## 2010-11-21 NOTE — Patient Instructions (Signed)

## 2010-11-21 NOTE — Assessment & Plan Note (Signed)
Quiescent, no obvious recurrent episodes, originally seen perioperatively back in 2006. Continue observation on aspirin and beta blocker for now, although if any recurrence or development of atrial fibrillation is noted, would recommend anticoagulation.

## 2010-11-21 NOTE — Progress Notes (Signed)
Clinical Summary Ms. Fetting is a 77 y.o.female presenting for followup. She was seen back in March. Reports that she has been doing relatively well overall. No palpitations, chest pain, or progressive shortness of breath. No focal motor weakness, memory problems, speech deficits.  Followup echocardiogram from April revealed an LVEF of 91-50% with diastolic dysfunction, MAC, moderate pulmonic regurgitation, mild dilatation of the ascending aorta (measurement given was only 35 mm however).  She has had no obvious recurrence of atrial flutter, originally diagnosed back in 2006. CHADS2 score is 2, and would generally recommend anticoagulation if recurrence of atrial flutter or documentation of atrial fibrillation is noted in the future.   Allergies  Allergen Reactions  . Penicillins     REACTION: rash, years ago  . Sinus & Allergy (Chlorpheniramine-Phenylephrine)   . Demerol Rash    Medication list reviewed.  Past Medical History  Diagnosis Date  . Atrial flutter   . Depression   . Essential hypertension, benign   . Arthritis   . Ulcerative colitis     Past Surgical History  Procedure Date  . Total knee arthroplasty   . Appendectomy   . Tonsillectomy   . Abdominal hysterectomy   . Bladder surgery     Social History Ms. Cowley reports that she has never smoked. She has never used smokeless tobacco. Ms. Curtice reports that she does not drink alcohol.  Review of Systems As outlined above, otherwise negative.  Physical Examination Filed Vitals:   11/21/10 0948  BP: 144/84  Pulse: 64   Overweight woman in no acute distress. HEENT: Conjunctiva and lids normal, oropharynx with moist mucosa. Neck: Supple, no elevated JVP or carotid bruits. Lungs: Clear to auscultation, nonlabored. Cardiac: Regular rate and rhythm, no S3 or rub. Abdomen: Soft, nontender, bowel sounds present. Extremities: No pitting edema.  ECG Sinus bradycardia with leftward axis and IVCD, nearly left  bundle branch block type. PR Interval 260 ms. Single PVC.   Problem List and Plan

## 2011-02-27 ENCOUNTER — Other Ambulatory Visit (INDEPENDENT_AMBULATORY_CARE_PROVIDER_SITE_OTHER): Payer: Self-pay | Admitting: *Deleted

## 2011-02-27 DIAGNOSIS — K5190 Ulcerative colitis, unspecified, without complications: Secondary | ICD-10-CM

## 2011-02-27 MED ORDER — MESALAMINE 400 MG PO TBEC: 400.0000 mg | DELAYED_RELEASE_TABLET | Freq: Two times a day (BID) | ORAL | Status: DC

## 2011-02-27 NOTE — Telephone Encounter (Signed)
Patient needs a refill on her Asacol 400 mg DR 180.

## 2011-03-02 ENCOUNTER — Encounter (INDEPENDENT_AMBULATORY_CARE_PROVIDER_SITE_OTHER): Payer: Self-pay | Admitting: *Deleted

## 2011-03-29 ENCOUNTER — Ambulatory Visit (INDEPENDENT_AMBULATORY_CARE_PROVIDER_SITE_OTHER): Payer: Medicare Other | Admitting: Internal Medicine

## 2011-03-29 ENCOUNTER — Encounter (INDEPENDENT_AMBULATORY_CARE_PROVIDER_SITE_OTHER): Payer: Self-pay | Admitting: Internal Medicine

## 2011-03-29 VITALS — BP 150/98 | HR 66 | Temp 97.9°F | Ht 64.5 in | Wt 246.1 lb

## 2011-03-29 DIAGNOSIS — K5190 Ulcerative colitis, unspecified, without complications: Secondary | ICD-10-CM | POA: Insufficient documentation

## 2011-03-29 DIAGNOSIS — K5120 Ulcerative (chronic) proctitis without complications: Secondary | ICD-10-CM

## 2011-03-29 NOTE — Progress Notes (Signed)
Subjective:     Patient ID: [name], female   DOB: [birth year], 78 y.o.   MRN: [id number]  HPI [name] a 78 yr old female here today for f/u of her UC. She tells me she is doing good. Last night she had stomach cramps and diarrhea. She says she had very loose stools and then at the last she had a BM that was like black glue.  Symptoms have now resolved. She did have one BM today and was normal.   She says that she has these "spells" every once in a while. Appetite is good. No weight loss.  No abdominal pain Her last colonoscopy was in 2010 and was normal except for a single papila and external hemorrhoids. UC remains in remission.   Review of Systems see hpi Current Outpatient Prescriptions  Medication Sig Dispense Refill  . acetaminophen (TYLENOL) 650 MG CR tablet Take 650 mg by mouth every 8 (eight) hours as needed.        Marland Kitchen aspirin 81 MG tablet Take 81 mg by mouth daily.        . Coenzyme Q10 (COQ10) 200 MG CAPS Take 1 capsule by mouth daily.        Marland Kitchen FLUoxetine (PROZAC) 20 MG capsule Take 20 mg by mouth daily.        . hyoscyamine (LEVSIN SL) 0.125 MG SL tablet Place 0.125 mg under the tongue every 4 (four) hours as needed.      Marland Kitchen L-Lysine 500 MG CAPS Take by mouth.        . mesalamine (ASACOL) 400 MG EC tablet Take 1 tablet (400 mg total) by mouth 2 (two) times daily.  60 tablet  3  . metoprolol succinate (TOPROL-XL) 25 MG 24 hr tablet Take 1 tablet (25 mg total) by mouth daily.  30 tablet  6  . Multiple Vitamin (MULTIVITAMIN) tablet Take 1 tablet by mouth daily.         Past Medical History  Diagnosis Date  . Atrial flutter   . Depression   . Essential hypertension, benign   . Arthritis   . Ulcerative colitis    History   Social History  . Marital Status: Married    Spouse Name: N/A    Number of Children: N/A  . Years of Education: N/A   Occupational History  . Not on file.   Social History Main Topics  . Smoking status: Never Smoker   . Smokeless tobacco: Never  Used  . Alcohol Use: No  . Drug Use: No  . Sexually Active: Not on file   Other Topics Concern  . Not on file   Social History Narrative  . No narrative on file   Family Status  Relation Status Death Age  . Mother Alive   . Father Deceased     MI  . Sister Alive     fair health  . Brother Deceased     One deceased from [facility], Others in good health   Allergies  Allergen Reactions  . Penicillins     REACTION: rash, years ago  . Sinus & Allergy (C-Phen)   . Demerol Rash        Objective:   Physical Exam Filed Vitals:   03/29/11 1422  Height: 5' 4.5" (1.638 m)  Weight: 246 lb 1.6 oz (111.63 kg)   Alert and oriented. Skin warm and dry. Oral mucosa is moist.   . Sclera anicteric, conjunctivae is pink. Thyroid not enlarged. No cervical  lymphadenopathy. Lungs clear. Heart regular rate and rhythm.  Abdomen is soft. Bowel sounds are positive. No hepatomegaly. No abdominal masses felt. No tenderness.  No edema to lower extremities. Patient is alert and oriented. Stool brown and guaiac negative.     Assessment:     UC which appears to be in remission. Today she has no symptoms.     Plan:    CBC, sedrate. OV in 6 months.

## 2011-03-29 NOTE — Patient Instructions (Signed)
CBC and sedrate. OV in 6 months

## 2011-03-30 LAB — CBC WITH DIFFERENTIAL/PLATELET
Basophils Absolute: 0.1 10*3/uL (ref 0.0–0.1)
Basophils Relative: 1 % (ref 0–1)
Hemoglobin: 14 g/dL (ref 12.0–15.0)
MCHC: 33.3 g/dL (ref 30.0–36.0)
Monocytes Relative: 12 % (ref 3–12)
Neutro Abs: 4.6 10*3/uL (ref 1.7–7.7)
Neutrophils Relative %: 49 % (ref 43–77)

## 2011-05-21 ENCOUNTER — Ambulatory Visit: Payer: Medicare Other | Admitting: Cardiology

## 2011-06-21 ENCOUNTER — Ambulatory Visit (INDEPENDENT_AMBULATORY_CARE_PROVIDER_SITE_OTHER): Payer: Medicare Other | Admitting: Cardiology

## 2011-06-21 ENCOUNTER — Encounter: Payer: Self-pay | Admitting: Cardiology

## 2011-06-21 VITALS — BP 144/80 | HR 56 | Ht 64.0 in | Wt 243.0 lb

## 2011-06-21 DIAGNOSIS — I4892 Unspecified atrial flutter: Secondary | ICD-10-CM

## 2011-06-21 DIAGNOSIS — I498 Other specified cardiac arrhythmias: Secondary | ICD-10-CM

## 2011-06-21 DIAGNOSIS — I10 Essential (primary) hypertension: Secondary | ICD-10-CM

## 2011-06-21 NOTE — Assessment & Plan Note (Signed)
Keep followup with Dr. Nadara Mustard.

## 2011-06-21 NOTE — Assessment & Plan Note (Signed)
Stable, asymptomatic.

## 2011-06-21 NOTE — Progress Notes (Signed)
   Clinical Summary [name] is a 78 y.o.female presenting for followup. She was seen in November 2012. She indicates no significant recurrent palpitations. ECG today shows sinus bradycardia at 56 with prolonged PR interval, left axis deviation, LVH.  She reports no chest pain with typical activities. No unusual shortness of breath.  We have discussed her prior remotely documented atrial flutter which has not recurred. The plan has been aspirin and beta blocker therapy, unless she shows any definite recurrence. Anticoagulation could be considered at that point.   Allergies  Allergen Reactions  . Penicillins     REACTION: rash, years ago  . Sinus & Allergy (Chlorpheniramine-Phenyleph Er)   . Demerol Rash    Current Outpatient Prescriptions  Medication Sig Dispense Refill  . acetaminophen (TYLENOL) 650 MG CR tablet Take 650 mg by mouth every 8 (eight) hours as needed.        Marland Kitchen aspirin 81 MG tablet Take 81 mg by mouth daily.        . Coenzyme Q10 (COQ10) 200 MG CAPS Take 1 capsule by mouth daily.        Marland Kitchen FLUoxetine (PROZAC) 20 MG capsule Take 20 mg by mouth daily.        . hyoscyamine (LEVSIN SL) 0.125 MG SL tablet Place 0.125 mg under the tongue every 4 (four) hours as needed.      Marland Kitchen L-Lysine 500 MG CAPS Take by mouth.        . mesalamine (ASACOL) 400 MG EC tablet Take 800 mg by mouth 2 (two) times daily.      . metoprolol succinate (TOPROL-XL) 25 MG 24 hr tablet Take 1 tablet (25 mg total) by mouth daily.  30 tablet  6  . Multiple Vitamin (MULTIVITAMIN) tablet Take 1 tablet by mouth daily.        Marland Kitchen DISCONTD: mesalamine (ASACOL) 400 MG EC tablet Take 1 tablet (400 mg total) by mouth 2 (two) times daily.  60 tablet  3    Past Medical History  Diagnosis Date  . Atrial flutter   . Depression   . Essential hypertension, benign   . Arthritis   . Ulcerative colitis     Social History [name] reports that she has never smoked. She has never used smokeless tobacco. [name]  reports that she does not drink alcohol.  Review of Systems Recent bout with poisonous oak. Otherwise negative.  Physical Examination Filed Vitals:   06/21/11 1055  BP: 144/80  Pulse: 56    Overweight woman in no acute distress.  HEENT: Conjunctiva and lids normal, oropharynx with moist mucosa.  Neck: Supple, no elevated JVP or carotid bruits.  Lungs: Clear to auscultation, nonlabored.  Cardiac: Regular rate and rhythm, no S3 or rub.  Abdomen: Soft, nontender, bowel sounds present.  Extremities: No pitting edema.   Problem List and Plan   ATRIAL FLUTTER Quiescent, remotely documented. Continue present strategy unless there is definite recurrence at which time we could consider anticoagulation.  BRADYCARDIA Stable, asymptomatic.  Essential hypertension, benign Keep followup with Dr. Nadara Mustard.    Satira Sark, M.D., F.A.C.C.

## 2011-06-21 NOTE — Patient Instructions (Signed)
Continue all current medications. Your physician wants you to follow up in:  1 year.  You will receive a reminder letter in the mail one-two months in advance.  If you don't receive a letter, please call our office to schedule the follow up appointment   

## 2011-06-21 NOTE — Assessment & Plan Note (Signed)
Quiescent, remotely documented. Continue present strategy unless there is definite recurrence at which time we could consider anticoagulation.

## 2011-06-26 ENCOUNTER — Other Ambulatory Visit: Payer: Self-pay | Admitting: Cardiology

## 2011-10-03 ENCOUNTER — Encounter (INDEPENDENT_AMBULATORY_CARE_PROVIDER_SITE_OTHER): Payer: Self-pay | Admitting: Internal Medicine

## 2011-10-03 ENCOUNTER — Ambulatory Visit (INDEPENDENT_AMBULATORY_CARE_PROVIDER_SITE_OTHER): Payer: Medicare Other | Admitting: Internal Medicine

## 2011-10-03 VITALS — BP 144/82 | HR 80 | Temp 98.2°F | Ht 64.0 in | Wt 243.4 lb

## 2011-10-03 DIAGNOSIS — K5120 Ulcerative (chronic) proctitis without complications: Secondary | ICD-10-CM

## 2011-10-03 LAB — CBC WITH DIFFERENTIAL/PLATELET
Eosinophils Relative: 3 % (ref 0–5)
HCT: 42.8 % (ref 36.0–46.0)
Hemoglobin: 14.6 g/dL (ref 12.0–15.0)
Lymphocytes Relative: 33 % (ref 12–46)
Lymphs Abs: 2.9 10*3/uL (ref 0.7–4.0)
MCV: 86.8 fL (ref 78.0–100.0)
Monocytes Absolute: 0.8 10*3/uL (ref 0.1–1.0)
Monocytes Relative: 9 % (ref 3–12)
Neutro Abs: 4.9 10*3/uL (ref 1.7–7.7)
RBC: 4.93 MIL/uL (ref 3.87–5.11)
RDW: 14.7 % (ref 11.5–15.5)
WBC: 8.9 10*3/uL (ref 4.0–10.5)

## 2011-10-03 MED ORDER — HYOSCYAMINE SULFATE 0.125 MG SL SUBL: 0.1250 mg | SUBLINGUAL_TABLET | SUBLINGUAL | Status: DC | PRN

## 2011-10-03 NOTE — Patient Instructions (Addendum)
CBC and sedrate. Imodium BID. PR in 2 weeks. OV in  6 months.

## 2011-10-03 NOTE — Progress Notes (Signed)
Subjective:     Patient ID: [name], female   DOB: 1930/01/20, 78 y.o.   MRN: [id number]  HPI  Her today for f/u of her UC.  She was last seen in March of this year. She was doing well. Her last colonoscopy was in 2010 and was normal except for a single papila and external hemorrhoids. UC remains in remission. She tells me she is having a flare about once every 2 weeks.  She c/o mid abdominal. She is having 7 loose stools a day.  Her stools were formed but loose. She thinks she is having a flare.The flares last about 3-4 hrs usually.   Appetite is good. No weight loss. No melena or bright red rectal bleeding.      Review of Systems Current Outpatient Prescriptions  Medication Sig Dispense Refill  . acetaminophen (TYLENOL) 650 MG CR tablet Take 650 mg by mouth every 8 (eight) hours as needed.        Marland Kitchen aspirin 81 MG tablet Take 81 mg by mouth daily.        . Coenzyme Q10 (COQ10) 200 MG CAPS Take 1 capsule by mouth daily.        Marland Kitchen FLUoxetine (PROZAC) 20 MG capsule Take 20 mg by mouth daily.        . hyoscyamine (LEVSIN SL) 0.125 MG SL tablet Place 1 tablet (0.125 mg total) under the tongue every 4 (four) hours as needed.  60 tablet  4  . L-Lysine 500 MG CAPS Take by mouth.        . mesalamine (ASACOL) 400 MG EC tablet Take 800 mg by mouth 2 (two) times daily.      . metoprolol succinate (TOPROL-XL) 25 MG 24 hr tablet Take 1 tablet (25 mg total) by mouth daily.  30 tablet  5  . Multiple Vitamin (MULTIVITAMIN) tablet Take 1 tablet by mouth daily.        Marland Kitchen DISCONTD: hyoscyamine (LEVSIN SL) 0.125 MG SL tablet Place 0.125 mg under the tongue every 4 (four) hours as needed.       Past Medical History  Diagnosis Date  . Atrial flutter   . Depression   . Essential hypertension, benign   . Arthritis   . Ulcerative colitis    History   Social History  . Marital Status: Married    Spouse Name: N/A    Number of Children: N/A  . Years of Education: N/A   Occupational History  .  Not on file.   Social History Main Topics  . Smoking status: Never Smoker   . Smokeless tobacco: Never Used  . Alcohol Use: No  . Drug Use: No  . Sexually Active: Not on file   Other Topics Concern  . Not on file   Social History Narrative  . No narrative on file   Family Status  Relation Status Death Age  . Mother Alive   . Father Deceased     MI  . Sister Alive     fair health  . Brother Deceased     One deceased from [name], Others in good health   Allergies  Allergen Reactions  . Penicillins     REACTION: rash, years ago  . Sinus & Allergy (Chlorpheniramine-Phenyleph Er)   . Demerol Rash   Past Surgical History  Procedure Date  . Total knee arthroplasty   . Appendectomy   . Tonsillectomy   . Abdominal hysterectomy   . Bladder surgery   .  Bilateral knee replacements      2007, 2008       Objective:   Physical Exam Filed Vitals:   10/03/11 1602  BP: 144/82  Pulse: 80  Temp: 98.2 F (36.8 C)  Height: 5' 4"  (1.626 m)  Weight: 243 lb 6.4 oz (110.406 kg)  Alert and oriented. Skin warm and dry. Oral mucosa is moist.   . Sclera anicteric, conjunctivae is pink. Thyroid not enlarged. No cervical lymphadenopathy. Lungs clear. Heart regular rate and rhythm.  Abdomen is soft. Bowel sounds are positive. No hepatomegaly. No abdominal masses felt. No tenderness.  No edema to lower extremities.        Assessment:   UC. She possible is having a flare, but her symptoms are not last but 3-5 hrs.   Suspect she has a component of irritable bowel.     Plan:    CBC and sedrate. OV in 6 months. Continue present medication. Imodium twice a day. For diarrhea. PR in 2 weeks. Refill on Levsin eprescribed to her pharmacy.

## 2011-10-10 ENCOUNTER — Ambulatory Visit (INDEPENDENT_AMBULATORY_CARE_PROVIDER_SITE_OTHER): Payer: Medicare Other | Admitting: Internal Medicine

## 2012-01-24 ENCOUNTER — Other Ambulatory Visit: Payer: Self-pay | Admitting: Cardiology

## 2012-02-26 ENCOUNTER — Encounter: Payer: Self-pay | Admitting: Physician Assistant

## 2012-02-27 ENCOUNTER — Other Ambulatory Visit: Payer: Self-pay | Admitting: *Deleted

## 2012-02-27 DIAGNOSIS — I4891 Unspecified atrial fibrillation: Secondary | ICD-10-CM

## 2012-02-27 DIAGNOSIS — I480 Paroxysmal atrial fibrillation: Secondary | ICD-10-CM

## 2012-02-27 DIAGNOSIS — R079 Chest pain, unspecified: Secondary | ICD-10-CM

## 2012-03-06 ENCOUNTER — Encounter (INDEPENDENT_AMBULATORY_CARE_PROVIDER_SITE_OTHER): Payer: Self-pay | Admitting: *Deleted

## 2012-03-06 ENCOUNTER — Other Ambulatory Visit: Payer: Self-pay

## 2012-03-06 ENCOUNTER — Other Ambulatory Visit (INDEPENDENT_AMBULATORY_CARE_PROVIDER_SITE_OTHER): Payer: Medicare Other

## 2012-03-06 DIAGNOSIS — I4892 Unspecified atrial flutter: Secondary | ICD-10-CM

## 2012-03-06 DIAGNOSIS — I480 Paroxysmal atrial fibrillation: Secondary | ICD-10-CM

## 2012-03-10 ENCOUNTER — Telehealth: Payer: Self-pay | Admitting: *Deleted

## 2012-03-10 NOTE — Telephone Encounter (Signed)
Notes Recorded by Laurine Blazer, LPN on 03/09/5496 at 2:64 PM Patient notified. Already has eph scheduled for 3/5 with Gene.

## 2012-03-10 NOTE — Telephone Encounter (Signed)
Message copied by Laurine Blazer on Mon Mar 10, 2012  3:05 PM ------      Message from: Donney Dice      Created: Fri Mar 07, 2012  3:20 PM       EF 50-55%; no WMAs; grade 1 diastolic dysfxn; mild PR ------

## 2012-03-12 ENCOUNTER — Encounter: Payer: Self-pay | Admitting: *Deleted

## 2012-03-12 ENCOUNTER — Encounter: Payer: Self-pay | Admitting: Physician Assistant

## 2012-03-12 ENCOUNTER — Ambulatory Visit (INDEPENDENT_AMBULATORY_CARE_PROVIDER_SITE_OTHER): Payer: Medicare Other | Admitting: Physician Assistant

## 2012-03-12 VITALS — BP 128/75 | HR 72 | Ht 64.0 in | Wt 239.0 lb

## 2012-03-12 DIAGNOSIS — I480 Paroxysmal atrial fibrillation: Secondary | ICD-10-CM | POA: Insufficient documentation

## 2012-03-12 DIAGNOSIS — R5381 Other malaise: Secondary | ICD-10-CM

## 2012-03-12 DIAGNOSIS — I4891 Unspecified atrial fibrillation: Secondary | ICD-10-CM

## 2012-03-12 DIAGNOSIS — R0602 Shortness of breath: Secondary | ICD-10-CM

## 2012-03-12 DIAGNOSIS — R531 Weakness: Secondary | ICD-10-CM

## 2012-03-12 DIAGNOSIS — E876 Hypokalemia: Secondary | ICD-10-CM

## 2012-03-12 NOTE — Assessment & Plan Note (Addendum)
I am concerned that the patient's complaint of persistent weakness may represent an anginal equivalent. She suggests a correlation with strenuous activity or exertion; moreover, she now reports occasional chest tightness, associated with increased activity. She denies any chest discomfort at rest, however. She ruled out for MI during recent hospitalization and had a followup outpatient echocardiogram yielding preserved LVF (EF 50-55%), with no focal WMAs. She has no known CAD, and has not had a recent ischemic evaluation. Therefore, my recommendation is to proceed with a Lexiscan stress Cardiolite study, to rule out occult ischemia. I would keep a very low threshold for cardiac catheterization. Unfortunately, we'll not be able to schedule this until she returns from a planned trip to [facility] tomorrow, where she will be for 2-4 weeks. She has been advised, however, to proceed directly to the nearest ER, in the event of worsening symptoms. Upon her return, and following completion of the stress test, she will be seen here in the clinic in followup, by Dr. Domenic Polite, for review of study results and further recommendations. Will also assess lipid status with a FLP, on scheduled date of procedure.

## 2012-03-12 NOTE — Assessment & Plan Note (Signed)
Will order followup labs, following recent potassium 3.1. Patient is on supplemental potassium, as well as a diuretic.

## 2012-03-12 NOTE — Patient Instructions (Addendum)
   Lexiscan Cardiolite stress test - just before next office visit   Decrease caffeine Continue all current medications.  Lab for BMET - today  Office will contact with results Continue all current medications. Follow up in  1 month

## 2012-03-12 NOTE — Progress Notes (Signed)
Primary Cardiologist: Johnny Bridge, MD   HPI: Post hospital followup from [facility], status post presentation with PAF RVR.   She presented with remote history of atrial flutter, treated with ASA and beta blocker. She spontaneously converted to NSR. We assessed her with a CHADS of 2 (HTN, age), and started her on Xarelto 15 mg daily, secondary to underlying CKD. TSH NL, but K 3.1.  We cleared patient for discharge and recommended further evaluation with an outpatient echocardiogram, as follows:    - Echocardiogram, February 27; EF 50-55%; no WMAs ; grade 1 diastolic dysfunction; mild PR  Clinically, she continues to report no tachycardia palpitations, as when she initially presented here at [facility]. However, her saline complaint remains that of persistent weakness, as she reported when she presented to the ED, and which has been ongoing for several months. This seems to be exacerbated by activity or exertion. She also suggests some occasional chest tightness, when she is "rushed". She is unable to give me a timeframe as to how long she has had this symptom. Recent labs revealed normal troponins and TSH.   12-lead EKG today, reviewed by me, indicates NSR with first-degree AVB at 68 bpm; IVCD  Allergies  Allergen Reactions  . Penicillins     REACTION: rash, years ago  . Sinus & Allergy (Chlorpheniramine-Phenyleph Er)   . Demerol Rash    Current Outpatient Prescriptions  Medication Sig Dispense Refill  . acetaminophen (TYLENOL) 650 MG CR tablet Take 650 mg by mouth every 8 (eight) hours as needed.        . hyoscyamine (LEVSIN SL) 0.125 MG SL tablet Place 1 tablet (0.125 mg total) under the tongue every 4 (four) hours as needed.  60 tablet  4  . Mesalamine (DELZICOL) 400 MG CPDR Take 800 mg by mouth 2 (two) times daily.      . metoprolol succinate (TOPROL-XL) 25 MG 24 hr tablet TAKE 1 TABLET BY MOUTH DAILY.  30 tablet  5  . potassium chloride SA (K-DUR,KLOR-CON) 20 MEQ tablet Take 10 mEq by mouth  daily.      . Rivaroxaban (XARELTO) 15 MG TABS tablet Take 15 mg by mouth daily.      Marland Kitchen triamterene-hydrochlorothiazide (DYAZIDE) 37.5-25 MG per capsule Take 1 capsule by mouth every morning.       No current facility-administered medications for this visit.    Past Medical History  Diagnosis Date  . Atrial flutter   . Depression   . Essential hypertension, benign   . Arthritis   . Ulcerative colitis     Past Surgical History  Procedure Laterality Date  . Total knee arthroplasty    . Appendectomy    . Tonsillectomy    . Abdominal hysterectomy    . Bladder surgery    . Bilateral knee replacements       2007, 2008    History   Social History  . Marital Status: Married    Spouse Name: N/A    Number of Children: N/A  . Years of Education: N/A   Occupational History  . Not on file.   Social History Main Topics  . Smoking status: Never Smoker   . Smokeless tobacco: Never Used  . Alcohol Use: No  . Drug Use: No  . Sexually Active: Not on file   Other Topics Concern  . Not on file   Social History Narrative  . No narrative on file    No family history on file.  ROS: no nausea,  vomiting; no fever, chills; no melena, hematochezia; no claudication  PHYSICAL EXAM: BP 128/75  Pulse 72  Ht 5' 4"  (1.626 m)  Wt 239 lb (108.41 kg)  BMI 41 kg/m2  SpO2 98% GENERAL: 79 year old female, obese; NAD HEENT: NCAT, PERRLA, EOMI; sclera clear; no xanthelasma NECK: palpable bilateral carotid pulses, no bruits; no JVD; no TM LUNGS: CTA bilaterally CARDIAC: RRR (S1, S2), with occasional premature beat; no significant murmurs; no rubs or gallops ABDOMEN: soft, protuberant EXTREMETIES: Trace peripheral edema SKIN: warm/dry; no obvious rash/lesions MUSCULOSKELETAL: no joint deformity NEURO: no focal deficit; NL affect   EKG: reviewed and available in Electronic Records   ASSESSMENT & PLAN:  Weakness I am concerned that the patient's complaint of persistent weakness may  represent an anginal equivalent. She suggests a correlation with strenuous activity or exertion; moreover, she now reports occasional chest tightness, associated with increased activity. She denies any chest discomfort at rest, however. She ruled out for MI during recent hospitalization and had a followup outpatient echocardiogram yielding preserved LVF (EF 50-55%), with no focal WMAs. She has no known CAD, and has not had a recent ischemic evaluation. Therefore, my recommendation is to proceed with a Lexiscan stress Cardiolite study, to rule out occult ischemia. I would keep a very low threshold for cardiac catheterization. Unfortunately, we'll not be able to schedule this until she returns from a planned trip to [facility] tomorrow, where she will be for 2-4 weeks. She has been advised, however, to proceed directly to the nearest ER, in the event of worsening symptoms. Upon her return, and following completion of the stress test, she will be seen here in the clinic in followup, by Dr. Domenic Polite, for review of study results and further recommendations. Will also assess lipid status with a FLP, on scheduled date of procedure.  Atrial fibrillation Patient remains in NSR with first-degree AVB, status post recent spontaneous conversion. She also has a LBBB, which we recently documented. We assessed her with a CHADS of 2 (HTN, age), and started her on Xarelto 15 mg daily, in light of underlying CKD. She remains on her previous dose of Toprol, for rate/rhythm control.  Hypokalemia Will order followup labs, following recent potassium 3.1. Patient is on supplemental potassium, as well as a diuretic.    Gene Perla Echavarria, PAC

## 2012-03-12 NOTE — Assessment & Plan Note (Signed)
Patient remains in NSR with first-degree AVB, status post recent spontaneous conversion. She also has a LBBB, which we recently documented. We assessed her with a CHADS of 2 (HTN, age), and started her on Xarelto 15 mg daily, in light of underlying CKD. She remains on her previous dose of Toprol, for rate/rhythm control.

## 2012-03-13 ENCOUNTER — Telehealth: Payer: Self-pay | Admitting: Physician Assistant

## 2012-03-13 NOTE — Telephone Encounter (Signed)
Lexiscan Cardiolite Wednesday, April 30, 2012 @[facility]    Checking percert

## 2012-03-19 ENCOUNTER — Telehealth: Payer: Self-pay | Admitting: Cardiology

## 2012-03-19 NOTE — Telephone Encounter (Signed)
Called patient.  Had received message patient daughter calling asking about FMLA papers.  Note:  These were signed on 03/12/2011 and scanned to patient record.  Unable to contact daughter Armonee Bojanowski at [phone] not find a DPR to speak with this family member. LM for patient to call me back on 03/13 to discuss issue with papers and remind her to have DPR updated.

## 2012-03-21 ENCOUNTER — Encounter: Payer: Self-pay | Admitting: *Deleted

## 2012-03-21 ENCOUNTER — Telehealth: Payer: Self-pay | Admitting: Cardiology

## 2012-03-21 NOTE — Telephone Encounter (Signed)
/  PT LAEFT MESSAGE TO CALL [facility]ONS [facility]SE FOR LAB RESULTS/ NEW MESSAGE TYPED IN CONTACTS OF THIS MESSAGE/TMJ

## 2012-03-21 NOTE — Telephone Encounter (Signed)
Called [phone] for [name] at [facility].  After speaking with patient daughter who informed me the [facility] paperwork was not received.  Found paperwork signed and faxed to [facility].  Did not see where paperwork was faxed to [employer].    Confirmed fax number [phone] with [employer].  Left message for Ms. Damita Dunnings to notify me if paperwork not received.

## 2012-04-21 ENCOUNTER — Telehealth: Payer: Self-pay | Admitting: Cardiology

## 2012-04-21 NOTE — Telephone Encounter (Signed)
Patient thinks that Xarelto is causing her to have cramps in her legs with swelling to her feet and ankles. This has been going on For 2 weeks.

## 2012-04-21 NOTE — Telephone Encounter (Signed)
Spoke with patient and she informed nurse that she felt like the xarelto was causing her to have cramping in her legs and LLE. Nurse informed patient that these symptoms may not be coming from [place] and she need to call her PCP for an evaluation of her symptoms.

## 2012-04-23 NOTE — Telephone Encounter (Signed)
[employer] XIVH#[id number] EXP 05-22-12

## 2012-04-29 ENCOUNTER — Ambulatory Visit (INDEPENDENT_AMBULATORY_CARE_PROVIDER_SITE_OTHER): Payer: Medicare Other | Admitting: Internal Medicine

## 2012-05-01 ENCOUNTER — Encounter (INDEPENDENT_AMBULATORY_CARE_PROVIDER_SITE_OTHER): Payer: Self-pay | Admitting: Internal Medicine

## 2012-05-01 ENCOUNTER — Ambulatory Visit (INDEPENDENT_AMBULATORY_CARE_PROVIDER_SITE_OTHER): Payer: Medicare Other | Admitting: Internal Medicine

## 2012-05-01 VITALS — BP 138/80 | HR 84 | Temp 98.7°F | Resp 18 | Ht 64.0 in | Wt 226.3 lb

## 2012-05-01 DIAGNOSIS — K5190 Ulcerative colitis, unspecified, without complications: Secondary | ICD-10-CM

## 2012-05-01 MED ORDER — MESALAMINE 400 MG PO CPDR: 800.0000 mg | DELAYED_RELEASE_CAPSULE | Freq: Two times a day (BID) | ORAL | Status: DC

## 2012-05-01 NOTE — Progress Notes (Signed)
Presenting complaint;  Followup for ulcerative colitis.  Subjective:  Patient is 79 year old Caucasian female with 7 year history of ulcerative colitis who is here for scheduled visit. About 2 months ago she had an episode of diarrhea which she believes was secondary to husband's illness and she was under  lot of stress. Around the same time she was admitted to [facility] in [place] with new onset atrial fibrillation. She is under care of Dr. Johnny Bridge and has an appointment in a.m and anxious to know the results of stress test that she had yesterday.  She has been on Xarelto and has not experienced any side effects. Her bowels generally move daily. She reports cramping across her lower abdomen which began yesterday. She had normal stool today. She has not experience fever chills melena or rectal bleeding.  Current Medications: Current Outpatient Prescriptions  Medication Sig Dispense Refill  . acetaminophen (TYLENOL) 650 MG CR tablet Take 650 mg by mouth every 8 (eight) hours as needed.        Marland Kitchen co-enzyme Q-10 30 MG capsule Take 200 mg by mouth daily.      . hyoscyamine (LEVSIN SL) 0.125 MG SL tablet Place 1 tablet (0.125 mg total) under the tongue every 4 (four) hours as needed.  60 tablet  4  . Mesalamine (DELZICOL) 400 MG CPDR Take 800 mg by mouth 2 (two) times daily.      . metoprolol succinate (TOPROL-XL) 25 MG 24 hr tablet TAKE 1 TABLET BY MOUTH DAILY.  30 tablet  5  . potassium chloride SA (K-DUR,KLOR-CON) 20 MEQ tablet Take 10 mEq by mouth daily.      . Rivaroxaban (XARELTO) 15 MG TABS tablet Take 15 mg by mouth daily.      Marland Kitchen triamterene-hydrochlorothiazide (DYAZIDE) 37.5-25 MG per capsule Take 1 capsule by mouth every morning.       No current facility-administered medications for this visit.     Objective: Blood pressure 138/80, pulse 84, temperature 98.7 F (37.1 C), temperature source Oral, resp. rate 18, height 5' 4"  (1.626 m), weight 226 lb 4.8 oz (102.649 kg). Patient is alert and  in no acute distress. Conjunctiva is pink. Sclera is nonicteric Oropharyngeal mucosa is normal. No neck masses or thyromegaly noted. Cardiac exam with regular rhythm normal S1 and S2. No murmur or gallop noted. Lungs are clear to auscultation. Abdomen is full. Bowel sounds are normal. Abdomen is soft and nontender without organomegaly or masses  No LE edema or clubbing noted.   Assessment:  #1. Ulcerative colitis. She had pancolitis diagnosed in January 2007. She is on oral mesalamine and remains in remission. If she was not in remission she would've bled while on Xarelto. Recent onset of lower abdominal cramps possibly related to food that she ate at a restaurant. Hopefully cramping will subside spontaneously. .   Plan:  Request recent blood work from Dr. Lyman Speller office. New prescription for Delzicol 800 mg by mouth twice a day issued for one month with 11 refills. Patient will call with progress report next week or earlier if for abdominal cramping is progressive and she has diarrhea. Office visit in one year.

## 2012-05-01 NOTE — Patient Instructions (Addendum)
Call office with progress report next week Monday. Notify if you've past bright red blood per rectum or tarry stools.

## 2012-05-05 ENCOUNTER — Ambulatory Visit: Payer: Medicare Other | Admitting: Cardiology

## 2012-05-05 ENCOUNTER — Ambulatory Visit (INDEPENDENT_AMBULATORY_CARE_PROVIDER_SITE_OTHER): Payer: Medicare Other | Admitting: Internal Medicine

## 2012-05-06 ENCOUNTER — Ambulatory Visit (INDEPENDENT_AMBULATORY_CARE_PROVIDER_SITE_OTHER): Payer: Medicare Other | Admitting: Cardiology

## 2012-05-06 ENCOUNTER — Ambulatory Visit: Payer: Medicare Other | Admitting: Cardiology

## 2012-05-06 ENCOUNTER — Encounter: Payer: Self-pay | Admitting: Cardiology

## 2012-05-06 VITALS — BP 113/69 | HR 74 | Ht 63.0 in | Wt 224.1 lb

## 2012-05-06 DIAGNOSIS — I4891 Unspecified atrial fibrillation: Secondary | ICD-10-CM

## 2012-05-06 DIAGNOSIS — I10 Essential (primary) hypertension: Secondary | ICD-10-CM

## 2012-05-06 DIAGNOSIS — R9439 Abnormal result of other cardiovascular function study: Secondary | ICD-10-CM | POA: Insufficient documentation

## 2012-05-06 NOTE — Progress Notes (Signed)
Clinical Summary [name] is an 79 y.o.female last seen by Mr. Serpe in March. At that time, stress testing was ordered to investigate possible ischemic heart disease. Marland KitchenShe was reporting progressive fatigue, dyspnea on exertion, occasional chest tightness with activity. She had no prior baseline history of CAD.  Lexiscan Cardiolite from 4/24 demonstrated no diagnostic ST segment abnormalities. There was a moderate-sized defect in the mid to basal inferolateral wall as well as entire inferior wall suggesting scar, no active ischemia. LVEF was calculated at 37%. Previous echocardiogram from February had demonstrated an LVEF of 50-55% with grade 1 diastolic dysfunction, septal dyssynergy consistent with bundle branch block.   [name] We discussed the results of her stress testing and reviewed the implications. In light of her progressive symptoms, we discussed the possibility of proceeding to a diagnostic cardiac catheterization to best understand her coronary anatomy and determine if there were any reasonable revascularization options, although no large ischemic burden was noted.  [name] tells me today that she is now living with her son in [place], [place] near [place], as her husband is in an assisted-living facility in that region. She plans to establish with a primary care provider, Milas Kocher, in late May, and has requested that we assist her in arranging cardiology evaluation through [facility], and have her procedure done at that facility. Our office is working on scheduling this for her.   Allergies  Allergen Reactions  . Penicillins     REACTION: rash, years ago  . Sinus & Allergy (Chlorpheniramine-Phenyleph Er)   . Demerol Rash    Current Outpatient Prescriptions  Medication Sig Dispense Refill  . acetaminophen (TYLENOL) 650 MG CR tablet Take 650 mg by mouth every 8 (eight) hours as needed.        Marland Kitchen co-enzyme Q-10 30 MG capsule Take  200 mg by mouth daily.      . hyoscyamine (LEVSIN SL) 0.125 MG SL tablet Place 1 tablet (0.125 mg total) under the tongue every 4 (four) hours as needed.  60 tablet  4  . Mesalamine (DELZICOL) 400 MG CPDR Take 2 capsules (800 mg total) by mouth 2 (two) times daily.  180 capsule  11  . metoprolol succinate (TOPROL-XL) 25 MG 24 hr tablet TAKE 1 TABLET BY MOUTH DAILY.  30 tablet  5  . potassium chloride SA (K-DUR,KLOR-CON) 20 MEQ tablet Take 10 mEq by mouth daily.      . Rivaroxaban (XARELTO) 15 MG TABS tablet Take 15 mg by mouth daily.      Marland Kitchen triamterene-hydrochlorothiazide (DYAZIDE) 37.5-25 MG per capsule Take 1 capsule by mouth every morning.       No current facility-administered medications for this visit.    Past Medical History  Diagnosis Date  . Atrial flutter   . Depression   . Essential hypertension, benign   . Arthritis   . Ulcerative colitis   . Paroxysmal atrial fibrillation    Past Surgical History  Procedure Laterality Date  . Total knee arthroplasty    . Appendectomy    . Tonsillectomy    . Abdominal hysterectomy    . Bladder surgery    . Bilateral knee replacements       2007, 2008   History reviewed. No pertinent family history.  Social History [name] reports that she has never smoked. She has never used smokeless tobacco. [name] reports that she does not drink alcohol.  Review of Systems Continues to deny  any major palpitations. No bleeding problems with Xarelto. She did see Dr. Laural Golden recently for followup of her ulcerative colitis, remains in remission. Otherwise negative.  Physical Examination Filed Vitals:   05/06/12 1502  BP: 113/69  Pulse: 74   Filed Weights   05/06/12 1502  Weight: 224 lb 1.9 oz (101.66 kg)   No acute distress. Overweight woman in no acute distress.  HEENT: Conjunctiva and lids normal, oropharynx with moist mucosa.  Neck: Supple, no elevated JVP or carotid bruits.  Lungs: Clear to auscultation, nonlabored.  Cardiac:  Regular rate and rhythm, no S3 or rub.  Abdomen: Soft, nontender, bowel sounds present.  Extremities: No pitting edema. Skin: Warm and dry. Musculoskeletal: No kyphosis. Neuropsychiatric: Alert and oriented x3, affect appropriate.   Problem List and Plan   Abnormal myocardial perfusion study Reviewed above, concerning for ischemic cardiomyopathy with inferolateral scar, although no large ischemic burden was noted, LVEF reduced at 37%. Interesting that her LVEF iwas low normal as of February, perhaps she has had an interval event. She continues to report significant fatigue, dyspnea on exertion NHYA class III affecting her ADLs. We discussed the possibility of proceeding to a diagnostic cardiac catheterization to best understand any potential revascularization strategies. As noted above, she is now living near [facility] and would like to have this procedure done through [facility] office is arranging cardiology evaluation through that facility. Can provide records for review.  Atrial fibrillation Paroxysmal, recently maintaining sinus rhythm. She reports no interval palpitations. Plan to continue Toprol-XL and Xarelto. She will have to temporarily hold anticoagulation for cardiac catheterization. This can be coordinated by her subsequent cardiology evaluation in [place].  Essential hypertension, benign Blood pressure well controlled today. No changes made.    Satira Sark, M.D., F.A.C.C.

## 2012-05-06 NOTE — Assessment & Plan Note (Signed)
Blood pressure well controlled today. No changes made.

## 2012-05-06 NOTE — Patient Instructions (Addendum)
   You are being referred to [facility] seeing Dr. Candice Camp on Thursday, May 08, 2012 at 10:00 am. Please arrive at 9:30 am. Address:[address] [place], [place]. [address]. Phone # [phone]. Your physician recommends that you continue on your current medications as directed. Please refer to the Current Medication list given to you today.

## 2012-05-06 NOTE — Assessment & Plan Note (Signed)
Reviewed above, concerning for ischemic cardiomyopathy with inferolateral scar, although no large ischemic burden was noted, LVEF reduced at 37%. Interesting that her LVEF iwas low normal as of February, perhaps she has had an interval event. She continues to report significant fatigue, dyspnea on exertion NHYA class III affecting her ADLs. We discussed the possibility of proceeding to a diagnostic cardiac catheterization to best understand any potential revascularization strategies. As noted above, she is now living near [facility] and would like to have this procedure done through [facility] office is arranging cardiology evaluation through that facility. Can provide records for review.

## 2012-05-06 NOTE — Assessment & Plan Note (Signed)
Paroxysmal, recently maintaining sinus rhythm. She reports no interval palpitations. Plan to continue Toprol-XL and Xarelto. She will have to temporarily hold anticoagulation for cardiac catheterization. This can be coordinated by her subsequent cardiology evaluation in [place].

## 2012-05-14 ENCOUNTER — Encounter (INDEPENDENT_AMBULATORY_CARE_PROVIDER_SITE_OTHER): Payer: Self-pay

## 2012-06-19 ENCOUNTER — Other Ambulatory Visit (INDEPENDENT_AMBULATORY_CARE_PROVIDER_SITE_OTHER): Payer: Self-pay | Admitting: Internal Medicine

## 2012-06-19 MED ORDER — MESALAMINE 400 MG PO CPDR: 800.0000 mg | DELAYED_RELEASE_CAPSULE | Freq: Two times a day (BID) | ORAL | Status: DC

## 2012-07-14 ENCOUNTER — Telehealth: Payer: Self-pay | Admitting: Cardiology

## 2012-07-14 NOTE — Telephone Encounter (Signed)
[name] called the office requesting to re-establish with Dr. Domenic Polite. States that she was seeing [facility]  Dr. Candice Camp. She is moving back to [place]. Will verify with Dr. Domenic Polite. Please call patient with decision.

## 2012-07-15 NOTE — Telephone Encounter (Signed)
That is fine. I saw her last in April. Please get records from [facility].

## 2012-07-15 NOTE — Telephone Encounter (Signed)
Informed pt that it is ok for her to return to practice as a pt. She said she would call at the end of the month to make an appointment and that she had an appointment with [facility] on 7-23 and that she would have them fax the records to [place] then.

## 2012-09-03 ENCOUNTER — Encounter: Payer: Self-pay | Admitting: Cardiology

## 2012-09-03 ENCOUNTER — Ambulatory Visit (INDEPENDENT_AMBULATORY_CARE_PROVIDER_SITE_OTHER): Payer: Self-pay | Admitting: Cardiology

## 2012-09-03 VITALS — BP 132/81 | HR 73 | Ht 64.0 in | Wt 210.1 lb

## 2012-09-03 DIAGNOSIS — I4891 Unspecified atrial fibrillation: Secondary | ICD-10-CM

## 2012-09-03 DIAGNOSIS — I429 Cardiomyopathy, unspecified: Secondary | ICD-10-CM

## 2012-09-03 DIAGNOSIS — I2510 Atherosclerotic heart disease of native coronary artery without angina pectoris: Secondary | ICD-10-CM | POA: Insufficient documentation

## 2012-09-03 DIAGNOSIS — I10 Essential (primary) hypertension: Secondary | ICD-10-CM

## 2012-09-03 MED ORDER — LOSARTAN POTASSIUM 25 MG PO TABS: 25.0000 mg | ORAL_TABLET | Freq: Every day | ORAL | Status: DC

## 2012-09-03 NOTE — Progress Notes (Signed)
Clinical Summary [name] is an 79 y.o.female last seen in this clinic back in April. She was evaluated following an abnormal Lexiscan Cardiolite as detailed in the previous note. At that time she was relocating to [place], [facility] near [place] and ultimately underwent followup cardiac testing with catheterization through the [facility] system. She has since that time moved back to [facility], was previously seeing Dr. Ulyses Amor with [facility]. I reviewed his most recent office note from July at which time the patient was described as being clinically stable, no additional testing or medication adjustments were made.  Previous studies reviewed including echocardiogram from February of this year that demonstrated an LVEF of 50-55%. Subsequent Cardiolite in April demonstrated LVEF down to 37%. She reminded me that she was undergoing a very stressful time during this transition, caring for her ailing husband.  I reviewed her cardiac testing, discussed implications of both CAD and cardiomyopathy. She has had a prior normal TSH, doubt that this is something unusual such as hemochromatosis. Could even potentially be a stress induced cardiomyopathy with concurrent CAD and overall mixed picture. She is not reporting any angina, has NYHA class II dyspnea. Some ankle edema intermittently. Not using her diuretic but 3 days a week.  We discussed medication adjustments aimed at managing cardiomyopathy. We discussed reassessing LVEF, also even considering EP consultation for device therapies if her LVEF remains 35% or less.   Allergies  Allergen Reactions  . Penicillins     REACTION: rash, years ago  . Sinus & Allergy [Chlorpheniramine-Phenyleph Er]   . Demerol Rash    Current Outpatient Prescriptions  Medication Sig Dispense Refill  . acetaminophen (TYLENOL) 650 MG CR tablet Take 650 mg by mouth every 8 (eight) hours as needed.        Marland Kitchen aspirin 81 MG tablet Take 81 mg by  mouth daily.      Marland Kitchen atorvastatin (LIPITOR) 20 MG tablet Take 20 mg by mouth daily.      Marland Kitchen azithromycin (ZITHROMAX) 1 G powder Take 1 packet by mouth once.      . DELZICOL 400 MG CPDR TAKE 2 CAPSULES BY MOUTH TWICE A DAY  120 capsule  2  . hyoscyamine (LEVSIN SL) 0.125 MG SL tablet Place 1 tablet (0.125 mg total) under the tongue every 4 (four) hours as needed.  60 tablet  4  . metoprolol succinate (TOPROL-XL) 25 MG 24 hr tablet TAKE 1 TABLET BY MOUTH DAILY.  30 tablet  5  . NITROSTAT 0.3 MG SL tablet Place 0.3 mg under the tongue every 5 (five) minutes as needed.       . potassium chloride SA (K-DUR,KLOR-CON) 20 MEQ tablet Take 10 mEq by mouth as needed.       . Rivaroxaban (XARELTO) 15 MG TABS tablet Take 15 mg by mouth daily.      Marland Kitchen triamterene-hydrochlorothiazide (DYAZIDE) 37.5-25 MG per capsule Take 1 capsule by mouth as needed.       Marland Kitchen losartan (COZAAR) 25 MG tablet Take 1 tablet (25 mg total) by mouth daily.  90 tablet  3   No current facility-administered medications for this visit.    Past Medical History  Diagnosis Date  . Atrial flutter   . Depression   . Essential hypertension, benign   . Arthritis   . Ulcerative colitis   . Paroxysmal atrial fibrillation   . Coronary atherosclerosis of native coronary artery     BMS RCA May 2014 - [place]  .  Secondary cardiomyopathy     LVEF 35%    Social History Ms. Hoey reports that she has never smoked. She has never used smokeless tobacco. Ms. Panozzo reports that she does not drink alcohol.  Review of Systems No palpitations or syncope. Stable appetite. Otherwise as outlined.  Physical Examination Filed Vitals:   09/03/12 0954  BP: 132/81  Pulse: 73   Filed Weights   09/03/12 0954  Weight: 210 lb 1.9 oz (95.31 kg)    No acute distress.  Overweight woman in no acute distress.  HEENT: Conjunctiva and lids normal, oropharynx with moist mucosa.  Neck: Supple, no elevated JVP or carotid bruits.  Lungs: Clear to  auscultation, nonlabored.  Cardiac: Regular rate and rhythm, no S3 or rub.  Abdomen: Soft, nontender, bowel sounds present.  Extremities: No pitting edema.  Skin: Warm and dry.  Musculoskeletal: No kyphosis.  Neuropsychiatric: Alert and oriented x3, affect appropriate.   Problem List and Plan   Secondary cardiomyopathy Based on most recent assessment, LVEF 35% in [place]. Outside records reviewed. Suspect that this is predominantly a nonischemic cardiomyopathy, she did have concurrent CAD, although essentially one-vessel. Plan is to continue aspirin, Toprol-XL, Lipitor, start Cozaar 25 mg daily. I have asked her to not use Dyazide or potassium supplements except as needed for increasing weight or edema. This could be changed to Lasix if necessary. She will have a followup echocardiogram and BMET for followup visit within the next month. In the interim I have asked her to weigh herself daily, also check blood pressure and heart rate.  Coronary atherosclerosis of native coronary artery Status post BMS to the RCA in May 2014, [place].  Atrial fibrillation History of atrial fibrillation and atrial flutter, has been fairly quiescent. She continues on anticoagulation with Xarelto.  Essential hypertension, benign Patient to keep record of blood pressure at home.    Satira Sark, M.D., F.A.C.C.

## 2012-09-03 NOTE — Assessment & Plan Note (Signed)
History of atrial fibrillation and atrial flutter, has been fairly quiescent. She continues on anticoagulation with Xarelto.

## 2012-09-03 NOTE — Patient Instructions (Addendum)
Your physician recommends that you schedule a follow-up appointment in: 1 month. Your physician has recommended you make the following change in your medication: Start Cozaar (losartan) 25 mg daily. Please do not take your potassium or dyazide daily anymore. Please use these on as directed. Your new prescription has been sent to your pharmacy. All other medications will remain the same.  Your physician has requested that you regularly monitor and record your blood pressure and heart rate readings at home. Please use the same machine at the same time of day to check your readings and record them to bring to your follow-up visit. Your physician recommends that you return for lab work in 64 month just before your next visit for BMET. Please have this done at [facility].  Daily Weight Record It is important to weigh yourself daily. Keep this daily weight chart near your scale. Weigh yourself each morning at the same time. Weigh yourself without shoes and with the same amount of clothes each day. Compare today's weight to yesterday's weight. Bring this form with you to your follow-up appointments.  Date: ________ Weight: ____________________ Date: ________ Weight: ____________________ Date: ________ Weight: ____________________ Date: ________ Weight: ____________________ Date: ________ Weight: ____________________ Date: ________ Weight: ____________________ Date: ________ Weight: ____________________ Date: ________ Weight: ____________________ Date: ________ Weight: ____________________ Date: ________ Weight: ____________________ Date: ________ Weight: ____________________ Date: ________ Weight: ____________________ Date: ________ Weight: ____________________ Date: ________ Weight: ____________________ Date: ________ Weight: ____________________ Date: ________ Weight: ____________________ Date: ________ Weight: ____________________ Date: ________ Weight: ____________________ Date: ________  Weight: ____________________ Date: ________ Weight: ____________________ Date: ________ Weight: ____________________ Date: ________ Weight: ____________________ Date: ________ Weight: ____________________ Date: ________ Weight: ____________________ Date: ________ Weight: ____________________ Date: ________ Weight: ____________________ Date: ________ Weight: ____________________ Date: ________ Weight: ____________________ Date: ________ Weight: ____________________ Date: ________ Weight: ____________________ Date: ________ Weight: ____________________ Date: ________ Weight: ____________________ Date: ________ Weight: ____________________ Date: ________ Weight: ____________________ Date: ________ Weight: ____________________ Date: ________ Weight: ____________________ Date: ________ Weight: ____________________ Date: ________ Weight: ____________________ Date: ________ Weight: ____________________ Date: ________ Weight: ____________________ Date: ________ Weight: ____________________ Date: ________ Weight: ____________________ Date: ________ Weight: ____________________ Date: ________ Weight: ____________________ Date: ________ Weight: ____________________ Date: ________ Weight: ____________________ Date: ________ Weight: ____________________ Date: ________ Weight: ____________________ Date: ________ Weight: ____________________ Date: ________ Weight: ____________________ Document Released: 03/08/2006 Document Revised: 03/19/2011 Document Reviewed: 12/13/2006 [employer] Patient Information 2014 [employer].

## 2012-09-03 NOTE — Assessment & Plan Note (Signed)
Based on most recent assessment, LVEF 35% in [place]. Outside records reviewed. Suspect that this is predominantly a nonischemic cardiomyopathy, she did have concurrent CAD, although essentially one-vessel. Plan is to continue aspirin, Toprol-XL, Lipitor, start Cozaar 25 mg daily. I have asked her to not use Dyazide or potassium supplements except as needed for increasing weight or edema. This could be changed to Lasix if necessary. She will have a followup echocardiogram and BMET for followup visit within the next month. In the interim I have asked her to weigh herself daily, also check blood pressure and heart rate.

## 2012-09-03 NOTE — Assessment & Plan Note (Signed)
Patient to keep record of blood pressure at home.

## 2012-09-03 NOTE — Assessment & Plan Note (Signed)
Status post BMS to the RCA in May 2014, [place].

## 2012-09-05 ENCOUNTER — Ambulatory Visit: Payer: Medicare Other | Admitting: Physician Assistant

## 2012-09-11 ENCOUNTER — Other Ambulatory Visit (INDEPENDENT_AMBULATORY_CARE_PROVIDER_SITE_OTHER): Payer: Medicare Other

## 2012-09-11 DIAGNOSIS — I059 Rheumatic mitral valve disease, unspecified: Secondary | ICD-10-CM

## 2012-09-11 DIAGNOSIS — I2510 Atherosclerotic heart disease of native coronary artery without angina pectoris: Secondary | ICD-10-CM

## 2012-09-11 DIAGNOSIS — I4891 Unspecified atrial fibrillation: Secondary | ICD-10-CM

## 2012-09-11 DIAGNOSIS — I429 Cardiomyopathy, unspecified: Secondary | ICD-10-CM

## 2012-09-15 ENCOUNTER — Other Ambulatory Visit: Payer: Self-pay | Admitting: Cardiology

## 2012-09-15 MED ORDER — RIVAROXABAN 15 MG PO TABS: 15.0000 mg | ORAL_TABLET | Freq: Every day | ORAL | Status: DC

## 2012-09-15 MED ORDER — POTASSIUM CHLORIDE CRYS ER 20 MEQ PO TBCR: 10.0000 meq | EXTENDED_RELEASE_TABLET | ORAL | Status: DC | PRN

## 2012-09-16 ENCOUNTER — Telehealth: Payer: Self-pay | Admitting: *Deleted

## 2012-09-16 NOTE — Telephone Encounter (Signed)
Patient informed. 

## 2012-09-16 NOTE — Telephone Encounter (Addendum)
Message left on nurse's voicemail saying she had a tooth removed over a week ago and she is continuing to have problems with bleeding from that area on and off since Sunday. Nurse spoke with patient and she says that she has applied pressure to the area on every occasion that it restarted bleeding. Patient said she went back to her dentist on yesterday and was informed that the area was healing properly. Patient requesting if she can hold either the aspirin or xarelto temporarily to see if bleeding will stop. Please advise.

## 2012-09-16 NOTE — Telephone Encounter (Signed)
Patient informed and verbalized understanding of plan. 

## 2012-09-16 NOTE — Telephone Encounter (Signed)
Message copied by Merlene Laughter on Tue Sep 16, 2012  2:26 PM ------      Message from: MCDOWELL, Aloha Gell      Created: Mon Sep 15, 2012  7:20 AM       Reviewed report. LVEF 45-50%, an improvement compared to last assessment. This is a good response, would continue medical therapy. No indication for ICD at this point. ------

## 2012-09-16 NOTE — Telephone Encounter (Signed)
Noted. If she is continuing to have trouble with mouth bleeding, would recommend that she hold her Xarelto over the next week.

## 2012-09-29 ENCOUNTER — Telehealth: Payer: Self-pay | Admitting: Cardiology

## 2012-09-29 NOTE — Telephone Encounter (Signed)
Pt informed of results.     Result Note    Renal function and potassium normal. Continue current regimen.      Attached to    [facility] (Order# [id number])                  Basic metabolic panel   Status: Final result Visible to patient: This result is not viewable by the patient. Next appt: 10/02/2012 at 03:40 PM in Cardiology Rozann Lesches, MD) Dx: Essential hypertension, benign         Notes Recorded by Satira Sark, MD on 09/29/2012 at 9:34 AM Renal function and potassium normal. Continue current regimen.    Specimen Collected: 09/26/12 Last Resulted: 09/26/12 9:18 AM Order Details View Encounter Lab and Collection Details Routing Result History    Results

## 2012-10-01 ENCOUNTER — Ambulatory Visit: Payer: Self-pay | Admitting: Cardiology

## 2012-10-02 ENCOUNTER — Encounter: Payer: Self-pay | Admitting: Cardiology

## 2012-10-02 ENCOUNTER — Ambulatory Visit (INDEPENDENT_AMBULATORY_CARE_PROVIDER_SITE_OTHER): Payer: Medicare Other | Admitting: Cardiology

## 2012-10-02 VITALS — BP 102/63 | HR 64 | Ht 64.0 in | Wt 214.8 lb

## 2012-10-02 DIAGNOSIS — I2510 Atherosclerotic heart disease of native coronary artery without angina pectoris: Secondary | ICD-10-CM

## 2012-10-02 DIAGNOSIS — I4891 Unspecified atrial fibrillation: Secondary | ICD-10-CM

## 2012-10-02 DIAGNOSIS — I429 Cardiomyopathy, unspecified: Secondary | ICD-10-CM

## 2012-10-02 NOTE — Assessment & Plan Note (Signed)
Continue current regimen including Toprol-XL and Xarelto.

## 2012-10-02 NOTE — Progress Notes (Signed)
Clinical Summary Ms. Dietzman is an 79 y.o.female seen was recently in August. Records reviewed in the previous note. Medical therapy was adjusted and she was scheduled for a followup echocardiogram as well as BMET.  Echocardiogram done just recently showed improvement in overall LVEF the range of 45-50%, septal motion consistent with left bundle-branch block, mild dilatation of the aortic root, mild mitral regurgitation, moderate left atrial enlargement, mild pulmonic regurgitation. Lab work showed BUN 17, creatinine 0.8, potassium 3.9.  She is here today with a family member. Reports no obvious angina symptoms, still feels tired with house chores. She is not exercising with any regularity however. We discussed this today. She is considering a walking regimen and possibly even water aerobics.  We reviewed her medications and home vitals signs.   Allergies  Allergen Reactions  . Penicillins     REACTION: rash, years ago  . Sinus & Allergy [Chlorpheniramine-Phenyleph Er]   . Demerol Rash    Current Outpatient Prescriptions  Medication Sig Dispense Refill  . acetaminophen (TYLENOL) 650 MG CR tablet Take 650 mg by mouth every 8 (eight) hours as needed.        Marland Kitchen aspirin 81 MG tablet Take 81 mg by mouth daily.      Marland Kitchen atorvastatin (LIPITOR) 20 MG tablet Take 20 mg by mouth daily.      . DELZICOL 400 MG CPDR TAKE 2 CAPSULES BY MOUTH TWICE A DAY  120 capsule  2  . hyoscyamine (LEVSIN SL) 0.125 MG SL tablet Place 1 tablet (0.125 mg total) under the tongue every 4 (four) hours as needed.  60 tablet  4  . losartan (COZAAR) 25 MG tablet Take 1 tablet (25 mg total) by mouth daily.  90 tablet  3  . metoprolol succinate (TOPROL-XL) 25 MG 24 hr tablet TAKE 1 TABLET BY MOUTH DAILY.  30 tablet  5  . potassium chloride SA (K-DUR,KLOR-CON) 20 MEQ tablet Take 0.5 tablets (10 mEq total) by mouth as needed.  15 tablet  3  . Rivaroxaban (XARELTO) 15 MG TABS tablet Take 1 tablet (15 mg total) by mouth daily.   30 tablet  3  . triamterene-hydrochlorothiazide (DYAZIDE) 37.5-25 MG per capsule Take 1 capsule by mouth as needed.       Marland Kitchen NITROSTAT 0.3 MG SL tablet Place 0.3 mg under the tongue every 5 (five) minutes as needed.        No current facility-administered medications for this visit.    Past Medical History  Diagnosis Date  . Atrial flutter   . Depression   . Essential hypertension, benign   . Arthritis   . Ulcerative colitis   . Paroxysmal atrial fibrillation   . Coronary atherosclerosis of native coronary artery     BMS RCA May 2014 - [place]  . Secondary cardiomyopathy     LVEF 35% - improved to 45-50%    Social History Ms. Ozga reports that she has never smoked. She has never used smokeless tobacco. Ms. Westerfield reports that she does not drink alcohol.  Review of Systems No palpitations or syncope. No bleeding episodes. Otherwise as outlined.  Physical Examination Filed Vitals:   10/02/12 1542  BP: 102/63  Pulse: 64   Filed Weights   10/02/12 1542  Weight: 214 lb 12.8 oz (97.433 kg)    No acute distress.  Overweight woman in no acute distress.  HEENT: Conjunctiva and lids normal, oropharynx with moist mucosa.  Neck: Supple, no elevated JVP or carotid bruits.  Lungs:  Clear to auscultation, nonlabored.  Cardiac: Regular rate and rhythm, no S3 or rub.  Abdomen: Soft, nontender, bowel sounds present.  Extremities: No pitting edema.  Skin: Warm and dry.  Musculoskeletal: No kyphosis.  Neuropsychiatric: Alert and oriented x3, affect appropriate.   Problem List and Plan   Atrial fibrillation Continue current regimen including Toprol-XL and Xarelto.  Coronary atherosclerosis of native coronary artery Symptomatically stable on medical therapy. Reinforced regular exercise regimen. Continue medical therapy, followup arranged in the next 3 months. I asked her to bring in 2 weeks of blood pressure recordings just prior to her visit.  Secondary cardiomyopathy She  has had significant improvement in LVEF up to the range of 45-50%. No indication for ICD.    Satira Sark, M.D., F.A.C.C.

## 2012-10-02 NOTE — Patient Instructions (Addendum)
Your physician recommends that you schedule a follow-up appointment in: 3 months. Your physician recommends that you continue on your current medications as directed. Please refer to the Current Medication list given to you today.

## 2012-10-02 NOTE — Assessment & Plan Note (Signed)
She has had significant improvement in LVEF up to the range of 45-50%. No indication for ICD.

## 2012-10-02 NOTE — Assessment & Plan Note (Addendum)
Symptomatically stable on medical therapy. Reinforced regular exercise regimen. Continue medical therapy, followup arranged in the next 3 months. I asked her to bring in 2 weeks of blood pressure recordings just prior to her visit.

## 2012-10-03 ENCOUNTER — Ambulatory Visit: Payer: Medicare Other | Admitting: Cardiology

## 2012-10-28 ENCOUNTER — Encounter (HOSPITAL_COMMUNITY): Payer: Self-pay | Admitting: Pharmacy Technician

## 2012-11-10 ENCOUNTER — Encounter (HOSPITAL_COMMUNITY): Payer: Self-pay | Admitting: *Deleted

## 2012-11-10 ENCOUNTER — Ambulatory Visit (HOSPITAL_COMMUNITY)
Admission: RE | Admit: 2012-11-10 | Discharge: 2012-11-10 | Disposition: A | Discharge status: Home / Self Care | Payer: Medicare Other | Source: Ambulatory Visit | Attending: Ophthalmology | Admitting: Ophthalmology

## 2012-11-10 ENCOUNTER — Encounter (HOSPITAL_COMMUNITY)
Admission: RE | Disposition: A | Discharge status: Home / Self Care | Payer: Self-pay | Source: Ambulatory Visit | Attending: Ophthalmology

## 2012-11-10 DIAGNOSIS — H26499 Other secondary cataract, unspecified eye: Secondary | ICD-10-CM | POA: Insufficient documentation

## 2012-11-10 HISTORY — PX: YAG LASER APPLICATION: SHX6189

## 2012-11-10 SURGERY — TREATMENT, USING YAG LASER
Anesthesia: LOCAL | Laterality: Bilateral

## 2012-11-10 MED ORDER — TETRACAINE HCL 0.5 % OP SOLN
OPHTHALMIC | Status: AC
  Filled 2012-11-10: qty 2

## 2012-11-10 MED ORDER — APRACLONIDINE HCL 1 % OP SOLN
1.0000 [drp] | OPHTHALMIC | Status: AC
  Administered 2012-11-10 (×3): 1 [drp] via OPHTHALMIC

## 2012-11-10 MED ORDER — TETRACAINE HCL 0.5 % OP SOLN
1.0000 [drp] | Freq: Once | OPHTHALMIC | Status: AC
  Administered 2012-11-10: 1 [drp] via OPHTHALMIC

## 2012-11-10 MED ORDER — TROPICAMIDE 1 % OP SOLN
1.0000 [drp] | OPHTHALMIC | Status: AC
  Administered 2012-11-10 (×3): 1 [drp] via OPHTHALMIC

## 2012-11-10 MED ORDER — TROPICAMIDE 1 % OP SOLN
OPHTHALMIC | Status: AC
  Filled 2012-11-10: qty 3

## 2012-11-10 MED ORDER — APRACLONIDINE HCL 1 % OP SOLN
OPHTHALMIC | Status: AC
  Filled 2012-11-10: qty 0.1

## 2012-11-10 NOTE — Brief Op Note (Signed)
   [name] 11/10/2012  Williams Che, MD  Yag Laser Self Test Completedyes. Procedure: Posterior Capsulotomy, both eyes.  Eye Protection Worn by Staff yes. Laser In Use Sign on Door yes.  Laser: Nd:YAG Spot Size: Fixed Burst Mode: III Power Setting: 2.0 mJ/burst Position treated: anterior capsule phimosis bands OU Number of shots: OD 9;  OS 10 Total energy delivered: OD 18.3;   OS 20.4 mJ  The patient tolerated the procedure without difficulty. No complications were encountered.   The patient was discharged home with the instructions to continue all her current glaucoma medications, if any.   Patient verbalizes understanding of discharge instructions yes.   Notes:oposterior capsule clear OU.  anterior capsule with dense traction bands OU.  these were treated successfully OU with noted release of capsular tension OU

## 2012-11-10 NOTE — H&P (Signed)
I have reviewed the pre printed H&P, the patient was re-examined, and I have identified no significant interval changes in the patient's medical condition.  There is no change in the plan of care since the history and physical of record. 

## 2012-11-10 NOTE — Op Note (Signed)
No op note required

## 2012-11-13 ENCOUNTER — Other Ambulatory Visit: Payer: Self-pay

## 2012-12-16 ENCOUNTER — Telehealth: Payer: Self-pay | Admitting: *Deleted

## 2012-12-16 NOTE — Telephone Encounter (Signed)
Received call from daughter of patient stating that she had 3 teeth removed last Monday and held her xarelto was held 3 days prior to extractions. Patient did well and restarted her xarelto. Last night patient went to ED @ [facility] because of bleeding at site of extractions. Per daughter, the bleeding was successfully stopped. Daughter said that the stitches were removed by dentist on yesterday morning also. Patient said the bleeding restarted again this morning and she contacted her dentist office about this and was told to hold tea bags at the site. Nurse advised patient's daughter to have patient hold xarelto dose today and then restart it tomorrow per coumadin nurse.

## 2012-12-24 ENCOUNTER — Ambulatory Visit: Payer: Medicare Other | Admitting: Cardiology

## 2012-12-26 ENCOUNTER — Ambulatory Visit: Payer: Medicare Other | Admitting: Cardiology

## 2013-01-12 ENCOUNTER — Encounter: Payer: Self-pay | Admitting: Cardiology

## 2013-01-12 ENCOUNTER — Ambulatory Visit (INDEPENDENT_AMBULATORY_CARE_PROVIDER_SITE_OTHER): Payer: Medicare Other | Admitting: Cardiology

## 2013-01-12 VITALS — BP 115/75 | HR 71 | Ht 63.5 in | Wt 227.0 lb

## 2013-01-12 DIAGNOSIS — I2510 Atherosclerotic heart disease of native coronary artery without angina pectoris: Secondary | ICD-10-CM

## 2013-01-12 DIAGNOSIS — I429 Cardiomyopathy, unspecified: Secondary | ICD-10-CM

## 2013-01-12 DIAGNOSIS — I4891 Unspecified atrial fibrillation: Secondary | ICD-10-CM

## 2013-01-12 NOTE — Assessment & Plan Note (Signed)
No progressive palpitations, heart rates are well controlled at baseline on Toprol-XL 25 mg daily. She will continue on Xarelto. Should have followup CBC and BMET for next visit with Dr. Nadara Mustard.

## 2013-01-12 NOTE — Assessment & Plan Note (Signed)
LVEF up to the range of 45-50% by echocardiogram in September 2014.

## 2013-01-12 NOTE — Assessment & Plan Note (Signed)
No progressive angina symptoms with history of BMS to the RCA in May 2014 ([place]).

## 2013-01-12 NOTE — Progress Notes (Signed)
Clinical Summary Ms. Husband is an 80 y.o.female last seen in September 2014. From a cardiac perspective she has been stable, no significant palpitations, one episode of chest pain since I last saw her. We reviewed her home blood pressure and heart rate checks, these look good overall, no significant tachycardia.  Echocardiogram from September 2014 showed improvement in overall LVEF the range of 45-50%, septal motion consistent with left bundle-branch block, mild dilatation of the aortic root, mild mitral regurgitation, moderate left atrial enlargement, mild pulmonic regurgitation.  We reviewed her medications and will not make any changes at this time.  Reviewed lab work from last year, hemoglobin was 13.0, BUN 17, creatinine 0.85.    Allergies  Allergen Reactions  . Penicillins     REACTION: rash, years ago  . Sinus & Allergy [Chlorpheniramine-Phenyleph Er]   . Demerol Rash    Current Outpatient Prescriptions  Medication Sig Dispense Refill  . acetaminophen (TYLENOL) 650 MG CR tablet Take 650 mg by mouth every 8 (eight) hours as needed.        Marland Kitchen aspirin 81 MG tablet Take 81 mg by mouth daily.      Marland Kitchen atorvastatin (LIPITOR) 20 MG tablet Take 20 mg by mouth daily.      . hyoscyamine (LEVSIN SL) 0.125 MG SL tablet Place 1 tablet (0.125 mg total) under the tongue every 4 (four) hours as needed.  60 tablet  4  . losartan (COZAAR) 25 MG tablet Take 1 tablet (25 mg total) by mouth daily.  90 tablet  3  . Mesalamine (DELZICOL) 400 MG CPDR DR capsule Take 800 mg by mouth 2 (two) times daily.      . metoprolol succinate (TOPROL-XL) 25 MG 24 hr tablet TAKE 1 TABLET BY MOUTH DAILY.  30 tablet  5  . NITROSTAT 0.3 MG SL tablet Place 0.3 mg under the tongue every 5 (five) minutes as needed.       . potassium chloride SA (K-DUR,KLOR-CON) 20 MEQ tablet Take 0.5 tablets (10 mEq total) by mouth as needed.  15 tablet  3  . Rivaroxaban (XARELTO) 15 MG TABS tablet Take 1 tablet (15 mg total) by mouth  daily.  30 tablet  3  . triamterene-hydrochlorothiazide (DYAZIDE) 37.5-25 MG per capsule Take 1 capsule by mouth as needed.        No current facility-administered medications for this visit.    Past Medical History  Diagnosis Date  . Atrial flutter   . Depression   . Essential hypertension, benign   . Arthritis   . Ulcerative colitis   . Paroxysmal atrial fibrillation   . Coronary atherosclerosis of native coronary artery     BMS RCA May 2014 - [place]  . Secondary cardiomyopathy     LVEF 35% - improved to 45-50%    Social History Ms. Soler reports that she has never smoked. She has never used smokeless tobacco. Ms. Mcgurn reports that she does not drink alcohol.  Review of Systems Plans to have 3 additional teeth extracted. She did have some bleeding after going back on Xarelto within 4-5 days of her last extraction. We discussed waiting at least 7 days this next time.  Physical Examination Filed Vitals:   01/12/13 1558  BP: 115/75  Pulse: 71   Filed Weights   01/12/13 1558  Weight: 227 lb (102.967 kg)    No acute distress.  Overweight woman in no acute distress.  HEENT: Conjunctiva and lids normal, oropharynx with moist mucosa.  Neck: Supple, no elevated JVP or carotid bruits.  Lungs: Clear to auscultation, nonlabored.  Cardiac: Regular rate and rhythm, no S3 or rub.  Abdomen: Soft, nontender, bowel sounds present.  Extremities: No pitting edema.    Problem List and Plan   Atrial fibrillation No progressive palpitations, heart rates are well controlled at baseline on Toprol-XL 25 mg daily. She will continue on Xarelto. Should have followup CBC and BMET for next visit with Dr. Nadara Mustard.  Secondary cardiomyopathy LVEF up to the range of 45-50% by echocardiogram in September 2014.  Coronary atherosclerosis of native coronary artery No progressive angina symptoms with history of BMS to the RCA in May 2014 ([place]).    Satira Sark, M.D.,  F.A.C.C.

## 2013-01-12 NOTE — Patient Instructions (Signed)
Your physician recommends that you schedule a follow-up appointment in: 4 months. You will receive a reminder letter in the mail in about 2 months reminding you to call and schedule your appointment. If you don't receive this letter, please contact our office. Your physician recommends that you continue on your current medications as directed. Please refer to the Current Medication list given to you today.

## 2013-01-15 ENCOUNTER — Other Ambulatory Visit: Payer: Self-pay | Admitting: *Deleted

## 2013-01-15 MED ORDER — RIVAROXABAN 15 MG PO TABS: 15.0000 mg | ORAL_TABLET | Freq: Every day | ORAL | Status: DC

## 2013-01-21 ENCOUNTER — Encounter (INDEPENDENT_AMBULATORY_CARE_PROVIDER_SITE_OTHER): Payer: Self-pay | Admitting: *Deleted

## 2013-04-27 ENCOUNTER — Ambulatory Visit (INDEPENDENT_AMBULATORY_CARE_PROVIDER_SITE_OTHER): Payer: Medicare Other | Admitting: Internal Medicine

## 2013-04-27 ENCOUNTER — Encounter (INDEPENDENT_AMBULATORY_CARE_PROVIDER_SITE_OTHER): Payer: Self-pay | Admitting: Internal Medicine

## 2013-04-27 VITALS — BP 132/58 | HR 66 | Temp 98.1°F | Ht 64.0 in | Wt 230.1 lb

## 2013-04-27 DIAGNOSIS — K5190 Ulcerative colitis, unspecified, without complications: Secondary | ICD-10-CM

## 2013-04-27 NOTE — Patient Instructions (Signed)
OV in 1 yr. Continue Delzicol

## 2013-04-27 NOTE — Progress Notes (Addendum)
Subjective:     Patient ID: [name], female   DOB: [birth year], 80 y.o.   MRN: [id number]  HPI Here today for f/u of her Ulcerative Coliltis. She was last seen in April of 2014 by Dr. Laural Golden.  Her last colonoscopy was in 2010 by Dr. Laural Golden which revealed normal colonoscopy except single anal papilla and external hemorrhoids. UC remains in remission.  She tells me she has been okay for the past several months. Her appetite has been okay. She has been on soft foods x 3 months. She has gained 4 pounds since her last visit.  Recently has lower dentures. There is no abdominal pain. She usually has a BM daily. Sometimes she has to eat a prune to have a BM. Presently Xarelto for atrial fibrillation. She denies melena or bright red rectal bleeding.    04/15/2013 Glucose 107, BUN 12, Creatinine 0.85, NA 140, K 4.3, Chloride 101, Albumin 4.3, Globulin total 2.5, Total bili 1.0, ALP 67, AST 19, ALT 18, WBC 7.7, H and H 13.1 and 40.1, MCV 89, Platelet ct 334.   Review of Systems Past Medical History  Diagnosis Date  . Atrial flutter   . Depression   . Essential hypertension, benign   . Arthritis   . Ulcerative colitis   . Paroxysmal atrial fibrillation   . Coronary atherosclerosis of native coronary artery     BMS RCA May 2014 - [place]  . Secondary cardiomyopathy     LVEF 35% - improved to 45-50%    Past Surgical History  Procedure Laterality Date  . Total knee arthroplasty    . Appendectomy    . Tonsillectomy    . Abdominal hysterectomy    . Bladder surgery    . Bilateral knee replacements       2007, 2008  . Yag laser application Bilateral 31/05/4006    Procedure: YAG LASER APPLICATION;  Surgeon: Williams Che, MD;  Location: [facility];  Service: Ophthalmology;  Laterality: Bilateral;    Allergies  Allergen Reactions  . Penicillins     REACTION: rash, years ago  . Sinus & Allergy [Chlorpheniramine-Phenyleph Er]   . Demerol Rash    Current Outpatient Prescriptions on File  Prior to Visit  Medication Sig Dispense Refill  . acetaminophen (TYLENOL) 650 MG CR tablet Take 650 mg by mouth every 8 (eight) hours as needed.        Marland Kitchen aspirin 81 MG tablet Take 81 mg by mouth daily.      Marland Kitchen atorvastatin (LIPITOR) 20 MG tablet Take 20 mg by mouth daily.      . hyoscyamine (LEVSIN SL) 0.125 MG SL tablet Place 1 tablet (0.125 mg total) under the tongue every 4 (four) hours as needed.  60 tablet  4  . losartan (COZAAR) 25 MG tablet Take 1 tablet (25 mg total) by mouth daily.  90 tablet  3  . Mesalamine (DELZICOL) 400 MG CPDR DR capsule Take 800 mg by mouth 2 (two) times daily.      . metoprolol succinate (TOPROL-XL) 25 MG 24 hr tablet TAKE 1 TABLET BY MOUTH DAILY.  30 tablet  5  . NITROSTAT 0.3 MG SL tablet Place 0.3 mg under the tongue every 5 (five) minutes as needed.       . potassium chloride SA (K-DUR,KLOR-CON) 20 MEQ tablet Take 0.5 tablets (10 mEq total) by mouth as needed.  15 tablet  3  . Rivaroxaban (XARELTO) 15 MG TABS tablet Take 1 tablet (15 mg  total) by mouth daily.  30 tablet  6  . triamterene-hydrochlorothiazide (DYAZIDE) 37.5-25 MG per capsule Take 1 capsule by mouth as needed.        No current facility-administered medications on file prior to visit.   Married. She has 3 children in good health. House wife.       Objective:   Physical Exam  Filed Vitals:   04/27/13 1358  BP: 132/58  Pulse: 66  Temp: 98.1 F (36.7 C)  Height: 5' 4"  (1.626 m)  Weight: 230 lb 1.6 oz (104.373 kg)   Alert and oriented. Skin warm and dry. Oral mucosa is moist.   . Sclera anicteric, conjunctivae is pink. Thyroid not enlarged. No cervical lymphadenopathy. Lungs clear. Heart regular rate and rhythm.  Abdomen is soft. Bowel sounds are positive. No hepatomegaly. No abdominal masses felt. No tenderness. Abdomen obese. No edema to lower extremities.      Assessment:    . Ulcerative colitis. She had pancolitis diagnosed in January 2007. She is on oral mesalamine and remains in  remission. I   .           Plan:      Will get labs from Dr. Nadara Mustard. OV in 1 yr.

## 2013-05-04 ENCOUNTER — Ambulatory Visit (INDEPENDENT_AMBULATORY_CARE_PROVIDER_SITE_OTHER): Payer: Medicare Other | Admitting: Internal Medicine

## 2013-05-11 ENCOUNTER — Telehealth: Payer: Self-pay | Admitting: *Deleted

## 2013-05-11 NOTE — Telephone Encounter (Signed)
Patient called with c/o not feeling well. Patient said her symptoms started around Saturday afternoon with dizziness,nausea, and the right side of her face felt strange. No chest pain or sob. Nurse advised patient to contact her PCP for an evaluation and if they felt it was cardiac related, to have them contact our office for an urgent appointment. Patient verbalized understanding of plan.

## 2013-06-08 ENCOUNTER — Encounter (HOSPITAL_COMMUNITY): Payer: Self-pay | Admitting: Pharmacy Technician

## 2013-06-08 ENCOUNTER — Ambulatory Visit (INDEPENDENT_AMBULATORY_CARE_PROVIDER_SITE_OTHER): Payer: Medicare Other | Admitting: Cardiology

## 2013-06-08 ENCOUNTER — Other Ambulatory Visit: Payer: Self-pay | Admitting: Cardiology

## 2013-06-08 ENCOUNTER — Telehealth: Payer: Self-pay | Admitting: Cardiology

## 2013-06-08 ENCOUNTER — Encounter: Payer: Self-pay | Admitting: Cardiology

## 2013-06-08 ENCOUNTER — Encounter: Payer: Self-pay | Admitting: *Deleted

## 2013-06-08 VITALS — BP 108/60 | HR 93 | Ht 63.0 in | Wt 225.4 lb

## 2013-06-08 DIAGNOSIS — I4891 Unspecified atrial fibrillation: Secondary | ICD-10-CM

## 2013-06-08 DIAGNOSIS — I4892 Unspecified atrial flutter: Secondary | ICD-10-CM

## 2013-06-08 MED ORDER — AMIODARONE HCL 200 MG PO TABS
200.0000 mg | ORAL_TABLET | Freq: Two times a day (BID) | ORAL | Status: DC
Start: 2013-06-08 — End: 2013-09-04

## 2013-06-08 NOTE — Progress Notes (Signed)
Clinical Summary [name] is an 80 y.o.female last seen in January of this year. [name] States she has felt weak over the last few weeks. An episode of possible temporal arteritis, was on prednisone for a few weeks. She has been aware of a more forceful heartbeat over the last few weeks at least.  ECG today confirms atrial flutter with variable conduction, left bundle branch block which is old. She has had atrial flutter documented several years ago, also paroxysmal atrial fibrillation. She reports compliance with Xarelto and Toprol-XL. No reported bleeding episodes.  Echocardiogram from September 2014 showed improvement in overall LVEF the range of 45-50%, septal motion consistent with left bundle-branch block, mild dilatation of the aortic root, mild mitral regurgitation, moderate left atrial enlargement, mild pulmonic regurgitation.    Allergies  Allergen Reactions  . Penicillins     REACTION: rash, years ago  . Sinus & Allergy [Chlorpheniramine-Phenyleph Er]   . Demerol Rash    Current Outpatient Prescriptions  Medication Sig Dispense Refill  . acetaminophen (TYLENOL) 650 MG CR tablet Take 650 mg by mouth every 8 (eight) hours as needed.        Marland Kitchen aspirin 81 MG tablet Take 81 mg by mouth daily.      Marland Kitchen atorvastatin (LIPITOR) 20 MG tablet Take 20 mg by mouth daily.      Marland Kitchen losartan (COZAAR) 25 MG tablet Take 1 tablet (25 mg total) by mouth daily.  90 tablet  3  . Mesalamine (DELZICOL) 400 MG CPDR DR capsule Take 800 mg by mouth 2 (two) times daily.      . metoprolol succinate (TOPROL-XL) 25 MG 24 hr tablet TAKE 1 TABLET BY MOUTH DAILY.  30 tablet  5  . NITROSTAT 0.3 MG SL tablet Place 0.3 mg under the tongue every 5 (five) minutes as needed.       . potassium chloride SA (K-DUR,KLOR-CON) 20 MEQ tablet Take 0.5 tablets (10 mEq total) by mouth as needed.  15 tablet  3  . Rivaroxaban (XARELTO) 15 MG TABS tablet Take 1 tablet (15 mg total) by mouth daily.   30 tablet  6  . triamterene-hydrochlorothiazide (DYAZIDE) 37.5-25 MG per capsule Take 1 capsule by mouth. On Monday, Wednesday, & Friday       No current facility-administered medications for this visit.    Past Medical History  Diagnosis Date  . Atrial flutter   . Depression   . Essential hypertension, benign   . Arthritis   . Ulcerative colitis   . Paroxysmal atrial fibrillation   . Coronary atherosclerosis of native coronary artery     BMS RCA May 2014 - [place]  . Secondary cardiomyopathy     LVEF 35% - improved to 45-50%    Social History Ms. Gomes reports that she has never smoked. She has never used smokeless tobacco. Ms. Blowers reports that she does not drink alcohol.  Review of Systems Prior right-sided facial discomfort has resolved in the last few weeks. She reports no chest pain. No bleeding problems. Just feels weak and lack of energy. Otherwise negative.  Physical Examination Filed Vitals:   06/08/13 1144  BP: 108/60  Pulse: 93   Filed Weights   06/08/13 1144  Weight: 225 lb 6.4 oz (102.241 kg)    She is in no distress. Overweight woman in no acute distress.  HEENT: Conjunctiva and lids normal, oropharynx with moist mucosa.  Neck: Supple, no elevated JVP or carotid bruits.  Lungs: Clear to auscultation, nonlabored.  Cardiac: Irregularly irregular, no S3 or rub.  Abdomen: Soft, nontender, bowel sounds present.  Extremities: No pitting edema.  Skin: Warm and dry. Musculoskeletal: No kyphosis. Neuropsychiatric: Alert and oriented x3, affect appropriate.   Problem List and Plan   ATRIAL FLUTTER Recurrent, not documented in quite some time. Potentially related to a feeling of weakness and lack of energy, although in the past she has had atrial arrhythmias without specific symptoms. She has been compliant with both Toprol-XL and Xarelto. Plan is to initiate amiodarone 200 mg twice daily, bring her back for a nurse visit next week with ECG, and if she  remains in atrial flutter we will schedule an elective DCCV for next week.  Coronary atherosclerosis of native coronary artery No progressive angina symptoms with history of BMS to the RCA in May 2014 ([place]).  Secondary cardiomyopathy LVEF up to the range of 45-50% by echocardiogram in September 2014.  Essential hypertension, benign Blood pressure normal today. Patient has a home blood pressure monitor and will be checking both heart rate and blood pressure over the next week.    Satira Sark, M.D., F.A.C.C.

## 2013-06-08 NOTE — Assessment & Plan Note (Signed)
LVEF up to the range of 45-50% by echocardiogram in September 2014.

## 2013-06-08 NOTE — Assessment & Plan Note (Signed)
Blood pressure normal today. Patient has a home blood pressure monitor and will be checking both heart rate and blood pressure over the next week.

## 2013-06-08 NOTE — Assessment & Plan Note (Signed)
Recurrent, not documented in quite some time. Potentially related to a feeling of weakness and lack of energy, although in the past she has had atrial arrhythmias without specific symptoms. She has been compliant with both Toprol-XL and Xarelto. Plan is to initiate amiodarone 200 mg twice daily, bring her back for a nurse visit next week with ECG, and if she remains in atrial flutter we will schedule an elective DCCV for next week.

## 2013-06-08 NOTE — Assessment & Plan Note (Signed)
No progressive angina symptoms with history of BMS to the RCA in May 2014 ([place]).

## 2013-06-08 NOTE — Patient Instructions (Addendum)
Your physician has recommended you make the following change in your medication:  Start amiodarone 200 mg tablet twice daily.  Your new prescription was sent to your pharmacy. Continue all other medications. Your physician has recommended that you have a Cardioversion (DCCV). Electrical Cardioversion uses a jolt of electricity to your heart either through paddles or wired patches attached to your chest. This is a controlled, usually prescheduled, procedure. Defibrillation is done under light anesthesia in the hospital, and you usually go home the day of the procedure. This is done to get your heart back into a normal rhythm. You are not awake for the procedure. Please see the instruction sheet given to you today. Please come to the office 06/16/13 @9 :00 am to get an EKG with the nurse.

## 2013-06-08 NOTE — Telephone Encounter (Signed)
DCCV next week at [facility] [id number] flutter Thursday, 06/18/13 @9 :30am with Domenic Polite

## 2013-06-08 NOTE — Telephone Encounter (Signed)
No precert required 

## 2013-06-09 ENCOUNTER — Other Ambulatory Visit: Payer: Self-pay | Admitting: Cardiology

## 2013-06-09 NOTE — Telephone Encounter (Signed)
[name] patient

## 2013-06-16 ENCOUNTER — Ambulatory Visit (INDEPENDENT_AMBULATORY_CARE_PROVIDER_SITE_OTHER): Payer: Medicare Other | Admitting: *Deleted

## 2013-06-16 DIAGNOSIS — I4891 Unspecified atrial fibrillation: Secondary | ICD-10-CM

## 2013-06-16 DIAGNOSIS — I4892 Unspecified atrial flutter: Secondary | ICD-10-CM

## 2013-06-16 NOTE — Telephone Encounter (Signed)
Patient informed. DCCV cancelled.

## 2013-06-16 NOTE — Telephone Encounter (Signed)
Message copied by Merlene Laughter on Tue Jun 16, 2013 10:10 AM ------      Message from: Satira Sark      Created: Tue Jun 16, 2013  9:21 AM       I reviewed the ECG on [name]. She is back in sinus rhythm with PACs, and therefore does not need to have a cardioversion this week. Can you please let her know and cancel the procedure. Thanks.            ----- Message -----         From: Merlene Laughter, LPN         Sent: 08/08/5945   8:59 AM           To: Satira Sark, MD                   ------

## 2013-06-16 NOTE — Telephone Encounter (Signed)
Message copied by Merlene Laughter on Tue Jun 16, 2013  9:25 AM ------      Message from: Satira Sark      Created: Tue Jun 16, 2013  9:21 AM       I reviewed the ECG on Ms. Yurko. She is back in sinus rhythm with PACs, and therefore does not need to have a cardioversion this week. Can you please let her know and cancel the procedure. Thanks.            ----- Message -----         From: Merlene Laughter, LPN         Sent: 03/14/6438   8:59 AM           To: Satira Sark, MD                   ------

## 2013-06-18 ENCOUNTER — Encounter (HOSPITAL_COMMUNITY): Admission: RE | Payer: Self-pay | Source: Ambulatory Visit

## 2013-06-18 ENCOUNTER — Ambulatory Visit (HOSPITAL_COMMUNITY): Admission: RE | Admit: 2013-06-18 | Payer: Medicare Other | Source: Ambulatory Visit | Admitting: Cardiology

## 2013-06-18 SURGERY — CARDIOVERSION
Anesthesia: Monitor Anesthesia Care

## 2013-07-01 ENCOUNTER — Encounter (INDEPENDENT_AMBULATORY_CARE_PROVIDER_SITE_OTHER): Payer: Self-pay

## 2013-07-06 ENCOUNTER — Other Ambulatory Visit: Payer: Self-pay | Admitting: Cardiology

## 2013-07-13 ENCOUNTER — Other Ambulatory Visit (INDEPENDENT_AMBULATORY_CARE_PROVIDER_SITE_OTHER): Payer: Self-pay | Admitting: Internal Medicine

## 2013-08-04 ENCOUNTER — Other Ambulatory Visit: Payer: Self-pay | Admitting: Cardiology

## 2013-09-03 ENCOUNTER — Other Ambulatory Visit: Payer: Self-pay | Admitting: Cardiology

## 2013-09-04 ENCOUNTER — Encounter: Payer: Self-pay | Admitting: Adult Health

## 2013-09-04 ENCOUNTER — Ambulatory Visit (INDEPENDENT_AMBULATORY_CARE_PROVIDER_SITE_OTHER): Payer: Medicare Other | Admitting: Adult Health

## 2013-09-04 ENCOUNTER — Telehealth: Payer: Self-pay | Admitting: Cardiology

## 2013-09-04 VITALS — BP 128/70 | HR 96 | Ht 63.0 in | Wt 228.0 lb

## 2013-09-04 DIAGNOSIS — I10 Essential (primary) hypertension: Secondary | ICD-10-CM

## 2013-09-04 DIAGNOSIS — R072 Precordial pain: Secondary | ICD-10-CM

## 2013-09-04 MED ORDER — NITROGLYCERIN 0.3 MG SL SUBL: 0.3000 mg | SUBLINGUAL_TABLET | SUBLINGUAL | Status: DC | PRN

## 2013-09-04 NOTE — Telephone Encounter (Signed)
Apt made today in [place] with K lawrence NP at 3:30 pm [facility] office to fax [place] records

## 2013-09-04 NOTE — Assessment & Plan Note (Addendum)
Leane Call will be ordered. We will evaluate for progression of CAD. She has been under a lot of stress and some of this may be related to feeling overwhelmed in caring for her husband. I discussed this with the patient and her daughter. who verbalized understanding and would like to have the stress test completed. Echocardiogram will also be completed to evaluate LV function and she had mildly decreased systolic function on echocardiogram in September of 2014. I will stop indoor as discussed. I have given her refill on sublingual nitroglycerin. She sees [place] for recurrent chest pressure. She will followup with Dr. Domenic Polite indeed novice for discussion of symptoms, and test results.

## 2013-09-04 NOTE — Progress Notes (Deleted)
Name: [name]    DOB: 12-Jun-1930  Age: 80 y.o.  MR#: [id number]       PCP:  Rory Percy, MD      Insurance: Payor: [employer] [place] / Plan: BLUE MEDICARE / Product Type: *No Product type* /   CC:   No chief complaint on file.   VS Filed Vitals:   09/04/13 1540  BP: 128/70  Pulse: 96  Height: 5' 3"  (1.6 m)  Weight: 228 lb (103.42 kg)  SpO2: 93%    Weights Current Weight  09/04/13 228 lb (103.42 kg)  06/08/13 225 lb 6.4 oz (102.241 kg)  04/27/13 230 lb 1.6 oz (104.373 kg)    Blood Pressure  BP Readings from Last 3 Encounters:  09/04/13 128/70  06/08/13 108/60  04/27/13 132/58     Admit date:  (Not on file) Last encounter with RMR:  Visit date not found   Allergy Penicillins; Sinus & allergy; and Demerol  Current Outpatient Prescriptions  Medication Sig Dispense Refill  . acetaminophen (TYLENOL) 500 MG tablet Take 1,000 mg by mouth daily as needed for mild pain or headache.      Marland Kitchen aspirin 81 MG tablet Take 81 mg by mouth daily.      Marland Kitchen atorvastatin (LIPITOR) 20 MG tablet TAKE 1 TABLET BY MOUTH AT BEDTIME  30 tablet  3  . DELZICOL 400 MG CPDR DR capsule TAKE 2 CAPSULES BY MOUTH TWICE DAILY  180 capsule  11  . isosorbide mononitrate (IMDUR) 30 MG 24 hr tablet Take 30 mg by mouth daily.      Marland Kitchen losartan (COZAAR) 25 MG tablet TAKE 1 TABLET BY MOUTH EVERY DAY  30 tablet  3  . metoprolol succinate (TOPROL-XL) 25 MG 24 hr tablet TAKE 1 TABLET BY MOUTH DAILY.  30 tablet  5  . NITROSTAT 0.3 MG SL tablet Place 0.3 mg under the tongue every 5 (five) minutes as needed.       . potassium chloride SA (K-DUR,KLOR-CON) 20 MEQ tablet Take 20 mEq by mouth 3 (three) times a week.      . triamterene-hydrochlorothiazide (DYAZIDE) 37.5-25 MG per capsule Take 1 capsule by mouth. On Monday, Wednesday, & Friday      . XARELTO 15 MG TABS tablet TAKE 1 TABLET BY MOUTH EVERY DAY  30 tablet  6  . amiodarone (PACERONE) 200 MG tablet Take 200 mg by mouth daily.      .  Mesalamine (DELZICOL) 400 MG CPDR DR capsule Take 800 mg by mouth 2 (two) times daily.       No current facility-administered medications for this visit.    Discontinued Meds:    Medications Discontinued During This Encounter  Medication Reason  . amiodarone (PACERONE) 200 MG tablet Completed Course  . potassium chloride SA (K-DUR,KLOR-CON) 20 MEQ tablet Duplicate    Patient Active Problem List   Diagnosis Date Noted  . Coronary atherosclerosis of native coronary artery 09/03/2012  . Secondary cardiomyopathy 09/03/2012  . Atrial fibrillation 03/12/2012  . DYSLIPIDEMIA 01/21/2009  . Essential hypertension, benign 01/21/2009  . ATRIAL FLUTTER 10/06/2008    LABS No results found for this basename: na, k, cl, co2, glucose, bun, creatinine, calcium, gfrnonaa, gfraa   CMP  No results found for this basename: na, k, cl, co2, glucose, bun, creatinine, calcium, prot, albumin, ast, alt, alkphos, bilitot, gfrnonaa, gfraa       Component Value Date/Time   WBC 8.9 10/03/2011 1614   WBC 9.5  03/29/2011 1438   HGB 14.6 10/03/2011 1614   HGB 14.0 03/29/2011 1438   HCT 42.8 10/03/2011 1614   HCT 42.0 03/29/2011 1438   MCV 86.8 10/03/2011 1614   MCV 88.1 03/29/2011 1438    Lipid Panel  No results found for this basename: chol, trig, hdl, cholhdl, vldl, ldlcalc    ABG No results found for this basename: phart, pco2, pco2art, po2, po2art, hco3, tco2, acidbasedef, o2sat     No results found for this basename: TSH   BNP (last 3 results) No results found for this basename: PROBNP,  in the last 8760 hours Cardiac Panel (last 3 results) No results found for this basename: CKTOTAL, CKMB, TROPONINI, RELINDX,  in the last 72 hours  Iron/TIBC/Ferritin/ %Sat No results found for this basename: iron, tibc, ferritin, ironpctsat     EKG Orders placed in visit on 06/16/13  . EKG 12-LEAD     Prior Assessment and Plan Problem List as of 09/04/2013     Cardiovascular and Mediastinum   Essential  hypertension, benign   Last Assessment & Plan   06/08/2013 Office Visit Written 06/08/2013 12:21 PM by Satira Sark, MD     Blood pressure normal today. Patient has a home blood pressure monitor and will be checking both heart rate and blood pressure over the next week.    ATRIAL FLUTTER   Last Assessment & Plan   06/08/2013 Office Visit Written 06/08/2013 12:20 PM by Satira Sark, MD     Recurrent, not documented in quite some time. Potentially related to a feeling of weakness and lack of energy, although in the past she has had atrial arrhythmias without specific symptoms. She has been compliant with both Toprol-XL and Xarelto. Plan is to initiate amiodarone 200 mg twice daily, bring her back for a nurse visit next week with ECG, and if she remains in atrial flutter we will schedule an elective DCCV for next week.    Atrial fibrillation   Last Assessment & Plan   01/12/2013 Office Visit Written 01/12/2013  4:29 PM by Satira Sark, MD     No progressive palpitations, heart rates are well controlled at baseline on Toprol-XL 25 mg daily. She will continue on Xarelto. Should have followup CBC and BMET for next visit with Dr. Nadara Mustard.    Coronary atherosclerosis of native coronary artery   Last Assessment & Plan   06/08/2013 Office Visit Written 06/08/2013 12:21 PM by Satira Sark, MD     No progressive angina symptoms with history of BMS to the RCA in May 2014 ([place]).    Secondary cardiomyopathy   Last Assessment & Plan   06/08/2013 Office Visit Written 06/08/2013 12:21 PM by Satira Sark, MD     LVEF up to the range of 45-50% by echocardiogram in September 2014.      Other   DYSLIPIDEMIA       Imaging: No results found.

## 2013-09-04 NOTE — Patient Instructions (Signed)
Your physician recommends that you schedule a follow-up appointment in: after your testing is complete,see Dr.McDowell at the [facility] office    Your physician has recommended you make the following change in your medication:     STOP Imdur  I refilled your nitroglycerine    Your physician has requested that you have an echocardiogram. Echocardiography is a painless test that uses sound waves to create images of your heart. It provides your doctor with information about the size and shape of your heart and how well your heart's chambers and valves are working. This procedure takes approximately one hour. There are no restrictions for this procedure.     Your physician has requested that you have a lexiscan myoview. For further information please visit [email or web address]. Please follow instruction sheet, as given.     Thank you for choosing [facility] !

## 2013-09-04 NOTE — Progress Notes (Signed)
HPI: [name] is an 80 year old female patient of Dr. Domenic Polite. We are following for ongoing assessment and management of CAD, status post stent to the right coronary artery in 2014, paroxysmal atrial fibrillation, decreased systolic function with LVEF of 45-50%, left bundle branch block chronically. The patient is around my schedule this afternoon for post hospitalization followup. She was discharged from [facility] yesterday. She was advised to followup in 2 weeks, but insisted on being seen today.  The patient was admitted to [facility] with chest pain. She states that she felt chest pressure and near syncope. She was ruled out for myocardial infarction. The symptoms are similar to what she experienced when she went into atrial fibrillation in the past. On arrival to the emergency room the patient was found to be in normal sinus rhythm with no evidence of atrial fibrillation. She is continued on Xarelto. On discharge, she was started on isosorbide 30 mg daily. She states this has made her feel bad, woozy and weak. She has had no recurrence of discomfort in her chest.  Of note, the patient is caring for her invalid husband with dementia and has been under a great deal of stress. Her daughter came down during her hospitalization, and had her father placed in an assisted-living facility. The plan is to leave her father to give her mother a break and to have followup testing if necessary. The patient is his only caregiver, and states that she is worn out from this, and states "I just cannot do it anymore.".   Allergies  Allergen Reactions  . Penicillins     REACTION: rash, years ago  . Sinus & Allergy [Chlorpheniramine-Phenyleph Er]   . Demerol Rash    Current Outpatient Prescriptions  Medication Sig Dispense Refill  . acetaminophen (TYLENOL) 500 MG tablet Take 1,000 mg by mouth daily as needed for mild pain or headache.      Marland Kitchen aspirin 81 MG tablet Take 81 mg by mouth daily.        Marland Kitchen atorvastatin (LIPITOR) 20 MG tablet TAKE 1 TABLET BY MOUTH AT BEDTIME  30 tablet  3  . DELZICOL 400 MG CPDR DR capsule TAKE 2 CAPSULES BY MOUTH TWICE DAILY  180 capsule  11  . losartan (COZAAR) 25 MG tablet TAKE 1 TABLET BY MOUTH EVERY DAY  30 tablet  3  . metoprolol succinate (TOPROL-XL) 25 MG 24 hr tablet TAKE 1 TABLET BY MOUTH DAILY.  30 tablet  5  . nitroGLYCERIN (NITROSTAT) 0.3 MG SL tablet Place 1 tablet (0.3 mg total) under the tongue every 5 (five) minutes as needed.  25 tablet  1  . potassium chloride SA (K-DUR,KLOR-CON) 20 MEQ tablet Take 20 mEq by mouth 3 (three) times a week.      . triamterene-hydrochlorothiazide (DYAZIDE) 37.5-25 MG per capsule Take 1 capsule by mouth. On Monday, Wednesday, & Friday      . XARELTO 15 MG TABS tablet TAKE 1 TABLET BY MOUTH EVERY DAY  30 tablet  6  . Mesalamine (DELZICOL) 400 MG CPDR DR capsule Take 800 mg by mouth 2 (two) times daily.       No current facility-administered medications for this visit.    Past Medical History  Diagnosis Date  . Atrial flutter   . Depression   . Essential hypertension, benign   . Arthritis   . Ulcerative colitis   . Paroxysmal atrial fibrillation   . Coronary atherosclerosis of native coronary artery  BMS RCA May 2014 - [place]  . Secondary cardiomyopathy     LVEF 35% - improved to 45-50%    Past Surgical History  Procedure Laterality Date  . Total knee arthroplasty    . Appendectomy    . Tonsillectomy    . Abdominal hysterectomy    . Bladder surgery    . Bilateral knee replacements       2007, 2008  . Yag laser application Bilateral 35/05/7320    Procedure: YAG LASER APPLICATION;  Surgeon: Williams Che, MD;  Location: [facility];  Service: Ophthalmology;  Laterality: Bilateral;    ROS: Review of systems complete and found to be negative unless listed above  PHYSICAL EXAM BP 128/70  Pulse 96  Ht 5' 3"  (1.6 m)  Wt 228 lb (103.42 kg)  BMI 40.40 kg/m2  SpO2 93% General: Well developed,  well nourished, in no acute distress, obese Head: Eyes PERRLA, No xanthomas.   Normal cephalic and atramatic  Lungs: Clear bilaterally to auscultation and percussion. Heart: HRRR S1 S2, without MRG.  Pulses are 2+ & equal.            No carotid bruit. No JVD.  No abdominal bruits. No femoral bruits. Abdomen: Bowel sounds are positive, abdomen soft and non-tender without masses or                  Hernia's noted. Msk:  Back normal, normal gait. Normal strength and tone for age. Extremities: No clubbing, cyanosis or edema.  DP +1 Neuro: Alert and oriented X 3. Psych:  Good affect, responds appropriately   EKG:  Completed in the office as no EKG was provided from recent hospitalization.HR of 74 beats per minute with left bundle branch block, PACs, and PVCs.  ASSESSMENT AND PLAN

## 2013-09-04 NOTE — Assessment & Plan Note (Signed)
Currently the patient remains in normal sinus rhythm, but had similar symptoms as to when she went into atrial fibrillation in the past. This constituted chest pressure and feeling of near syncope. EKG does not demonstrate atrial fibrillation, but does have PACs and PVCs. She is no longer taking amiodarone. She is uncertain when this was discontinued. She will remain on rivoroxaban 15 mg and metoprolol.

## 2013-09-04 NOTE — Assessment & Plan Note (Signed)
Currently well-controlled. No changes at this time. I will discontinue him to work as she is intolerant of this causing a good deal of fatigue. There is no evidence of hypotension.

## 2013-09-16 ENCOUNTER — Ambulatory Visit (HOSPITAL_COMMUNITY)
Admission: RE | Admit: 2013-09-16 | Discharge: 2013-09-16 | Disposition: A | Discharge status: Home / Self Care | Payer: Medicare Other | Source: Ambulatory Visit | Attending: Adult Health | Admitting: Adult Health

## 2013-09-16 ENCOUNTER — Encounter (HOSPITAL_COMMUNITY): Payer: Self-pay

## 2013-09-16 ENCOUNTER — Encounter (HOSPITAL_COMMUNITY)
Admission: RE | Admit: 2013-09-16 | Discharge: 2013-09-16 | Disposition: A | Discharge status: Home / Self Care | Payer: Medicare Other | Source: Ambulatory Visit | Attending: Adult Health | Admitting: Adult Health

## 2013-09-16 DIAGNOSIS — R079 Chest pain, unspecified: Secondary | ICD-10-CM | POA: Insufficient documentation

## 2013-09-16 DIAGNOSIS — I2510 Atherosclerotic heart disease of native coronary artery without angina pectoris: Secondary | ICD-10-CM | POA: Diagnosis not present

## 2013-09-16 DIAGNOSIS — I10 Essential (primary) hypertension: Secondary | ICD-10-CM | POA: Insufficient documentation

## 2013-09-16 DIAGNOSIS — I080 Rheumatic disorders of both mitral and aortic valves: Secondary | ICD-10-CM | POA: Diagnosis not present

## 2013-09-16 DIAGNOSIS — I4891 Unspecified atrial fibrillation: Secondary | ICD-10-CM | POA: Insufficient documentation

## 2013-09-16 DIAGNOSIS — R55 Syncope and collapse: Secondary | ICD-10-CM | POA: Insufficient documentation

## 2013-09-16 DIAGNOSIS — R072 Precordial pain: Secondary | ICD-10-CM

## 2013-09-16 DIAGNOSIS — I059 Rheumatic mitral valve disease, unspecified: Secondary | ICD-10-CM

## 2013-09-16 MED ORDER — TECHNETIUM TC 99M SESTAMIBI GENERIC - CARDIOLITE
10.0000 | Freq: Once | INTRAVENOUS | Status: AC | PRN
Start: 2013-09-16 — End: 2013-09-16
  Administered 2013-09-16: 10 via INTRAVENOUS

## 2013-09-16 MED ORDER — SODIUM CHLORIDE 0.9 % IJ SOLN
10.0000 mL | INTRAMUSCULAR | Status: DC | PRN
  Administered 2013-09-16: 10 mL via INTRAVENOUS

## 2013-09-16 MED ORDER — TECHNETIUM TC 99M SESTAMIBI - CARDIOLITE
30.0000 | Freq: Once | INTRAVENOUS | Status: AC | PRN
  Administered 2013-09-16: 30 via INTRAVENOUS

## 2013-09-16 MED ORDER — REGADENOSON 0.4 MG/5ML IV SOLN
0.4000 mg | Freq: Once | INTRAVENOUS | Status: AC | PRN
  Administered 2013-09-16: 0.4 mg via INTRAVENOUS

## 2013-09-16 MED ORDER — REGADENOSON 0.4 MG/5ML IV SOLN
INTRAVENOUS | Status: AC
  Administered 2013-09-16: 0.4 mg via INTRAVENOUS
  Filled 2013-09-16: qty 5

## 2013-09-16 MED ORDER — SODIUM CHLORIDE 0.9 % IJ SOLN
INTRAMUSCULAR | Status: AC
  Administered 2013-09-16: 10 mL via INTRAVENOUS
  Filled 2013-09-16: qty 10

## 2013-09-16 NOTE — Progress Notes (Signed)
  Echocardiogram 2D Echocardiogram has been performed.  [place], [place] 09/16/2013, 9:06 AM

## 2013-09-16 NOTE — Progress Notes (Signed)
Stress Lab Nurses Notes - [place] 09/16/2013 Reason for doing test: Afib, chest pressure & syncope Type of test: Wille Glaser Nurse performing test: Gerrit Halls, RN Nuclear Medicine Tech: Redmond Baseman Echo Tech: Not Applicable MD performing test: S. McDowell/M.Bonnell Public PA Family MD: Nadara Mustard Test explained and consent signed: Yes.   IV started: Saline lock flushed, No redness or edema and Saline lock started in radiology Symptoms: chest tightness & SOB Treatment/Intervention: None Reason test stopped: protocol completed After recovery IV was: Discontinued via X-ray tech and No redness or edema Patient to return to Nuc. Med at :12:30 Patient discharged: Home Patient's Condition upon discharge was: stable Comments: During test BP 134/62 & HR 71.  Recovery BP 128/66 & HR 68.  Symptoms resolved in recovery. Geanie Cooley T

## 2013-09-17 ENCOUNTER — Encounter: Payer: Self-pay | Admitting: Cardiology

## 2013-09-17 ENCOUNTER — Ambulatory Visit: Payer: Medicare Other | Admitting: Cardiology

## 2013-09-17 ENCOUNTER — Ambulatory Visit (INDEPENDENT_AMBULATORY_CARE_PROVIDER_SITE_OTHER): Payer: Medicare Other | Admitting: Cardiology

## 2013-09-17 VITALS — BP 152/72 | HR 80 | Ht 63.0 in | Wt 227.0 lb

## 2013-09-17 DIAGNOSIS — I209 Angina pectoris, unspecified: Secondary | ICD-10-CM

## 2013-09-17 DIAGNOSIS — I25119 Atherosclerotic heart disease of native coronary artery with unspecified angina pectoris: Secondary | ICD-10-CM

## 2013-09-17 DIAGNOSIS — R9439 Abnormal result of other cardiovascular function study: Secondary | ICD-10-CM

## 2013-09-17 DIAGNOSIS — I2510 Atherosclerotic heart disease of native coronary artery without angina pectoris: Secondary | ICD-10-CM

## 2013-09-17 DIAGNOSIS — I4891 Unspecified atrial fibrillation: Secondary | ICD-10-CM

## 2013-09-17 DIAGNOSIS — I480 Paroxysmal atrial fibrillation: Secondary | ICD-10-CM

## 2013-09-17 MED ORDER — METOPROLOL SUCCINATE ER 25 MG PO TB24: 37.5000 mg | ORAL_TABLET | Freq: Every day | ORAL | Status: DC

## 2013-09-17 NOTE — Assessment & Plan Note (Signed)
Maintaining sinus rhythm. Continues on Xarelto.

## 2013-09-17 NOTE — Patient Instructions (Signed)
Your physician recommends that you keep your follow-up appointment in [facility] 9/21 at 4:20pm  Your physician has recommended you make the following change in your medication:   INCREASE YOUR METOPROLOL TO 37.5MG DAILY  Thank you for choosing [facility] !

## 2013-09-17 NOTE — Assessment & Plan Note (Signed)
History of BMS to the RCA in May 2014 in [place]. Recent LVEF 45-50% by followup echocardiogram.

## 2013-09-17 NOTE — Progress Notes (Signed)
Clinical Summary [name] is an 80 y.o.female most recently seen in the office in late August by Ms. Lawrence NP following a brief stay at [name] with chest pain. She was placed on Imdur for antianginal effect, although was not able to take this, describing profound weakness after only a few days. At her last visit she also reported increased stress at home, husband ailing with advancing dementia. Followup cardiac structural and ischemic testing was arranged.  Echocardiogram from September 9 reported moderate LVH with LVEF 45-50%, inferior and inferolateral hypokinesis, grade 1 diastolic dysfunction, mild to moderate left atrial enlargement, minor MAC with mild mitral regurgitation, mildly dilated aortic root, trivial tricuspid regurgitation, unable to assess PASP. Lexiscan Cardiolite also done on September 9 was overall low risk with evidence of inferior ischemia (moderate-sized defect in RCA distribution), mild inferior hypokinesis, and LVEF of 55%.  She is here with her daughter today, we reviewed the results in detail and discussed the implications. She has potentially had restenosis in the RCA distribution, or perhaps even a denovo stenosis. She does report regular angina symptoms, has not used nitroglycerin. Mainly seems to be a feeling of weakness, also chest pressure with emotional upset and stress. Her husband, reportedly with dementia, was recently in an assisted-living facility, but came home after only 2 weeks, and now the family is figuring out how to to deal with this.   We discussed options of either trying to modify antianginal regimen for syptom control, versus proceeding to a diagnostic heart catheterization. She does not want to use any long-acting nitrates due to her experience with Imdur, also tells me that she did not tolerate Norvasc in the past due to leg edema. We discussed trying to advance her beta blocker somewhat as an option. After reviewing all options, she  voiced comfort with trying adjustments in medical therapy prior to proceeding with any invasive testing. She already has an office visit to see me in the next few weeks in [name].   Allergies  Allergen Reactions  . Penicillins     REACTION: rash, years ago  . Sinus & Allergy [Chlorpheniramine-Phenyleph Er]   . Demerol Rash    Current Outpatient Prescriptions  Medication Sig Dispense Refill  . acetaminophen (TYLENOL) 500 MG tablet Take 1,000 mg by mouth daily as needed for mild pain or headache.      Marland Kitchen aspirin 81 MG tablet Take 81 mg by mouth daily.      Marland Kitchen atorvastatin (LIPITOR) 20 MG tablet TAKE 1 TABLET BY MOUTH AT BEDTIME  30 tablet  3  . DELZICOL 400 MG CPDR DR capsule TAKE 2 CAPSULES BY MOUTH TWICE DAILY  180 capsule  11  . losartan (COZAAR) 25 MG tablet TAKE 1 TABLET BY MOUTH EVERY DAY  30 tablet  3  . Mesalamine (DELZICOL) 400 MG CPDR DR capsule Take 800 mg by mouth 2 (two) times daily.      . metoprolol succinate (TOPROL-XL) 25 MG 24 hr tablet Take 1.5 tablets (37.5 mg total) by mouth daily.  45 tablet  5  . nitroGLYCERIN (NITROSTAT) 0.3 MG SL tablet Place 1 tablet (0.3 mg total) under the tongue every 5 (five) minutes as needed.  25 tablet  1  . potassium chloride SA (K-DUR,KLOR-CON) 20 MEQ tablet Take 20 mEq by mouth 3 (three) times a week.      . triamterene-hydrochlorothiazide (DYAZIDE) 37.5-25 MG per capsule Take 1 capsule by mouth. On Monday, Wednesday, & Friday      .  XARELTO 15 MG TABS tablet TAKE 1 TABLET BY MOUTH EVERY DAY  30 tablet  6   No current facility-administered medications for this visit.    Past Medical History  Diagnosis Date  . Atrial flutter   . Depression   . Essential hypertension, benign   . Arthritis   . Ulcerative colitis   . Paroxysmal atrial fibrillation   . Coronary atherosclerosis of native coronary artery     BMS RCA May 2014 - [place]  . Secondary cardiomyopathy     LVEF 35% - improved to 45-50%    Social History [name] reports  that she has never smoked. She has never used smokeless tobacco. [name] reports that she does not drink alcohol.  Review of Systems No palpitations or syncope. No reported bleeding problems. Other systems reviewed and negative except as outlined.  Physical Examination Filed Vitals:   09/17/13 1358  BP: 152/72  Pulse: 80   Filed Weights   09/17/13 1358  Weight: 227 lb (102.967 kg)    She is in no distress.  Overweight woman in no acute distress.  HEENT: Conjunctiva and lids normal, oropharynx with moist mucosa.  Neck: Supple, no elevated JVP or carotid bruits.  Lungs: Clear to auscultation, nonlabored.  Cardiac: Irregularly irregular, no S3 or rub.  Abdomen: Soft, nontender, bowel sounds present.  Extremities: No pitting edema.  Skin: Warm and dry.  Musculoskeletal: No kyphosis.  Neuropsychiatric: Alert and oriented x3, affect appropriate.    Problem List and Plan   Abnormal myocardial perfusion study Consistent with RCA distribution ischemia, overall normal LVEF. Technically low risk study. I am concerned however that she is reporting angina symptoms, we are somewhat limited in advancing her antianginal regimen as detailed above. As per above discussion, we will try and increase Toprol-XL to 37.5 mg daily, I will see her back in the next few weeks as already scheduled. Reminded her about using nitroglycerin as needed. She will let us know sooner if symptoms escalate.  Atrial fibrillation Maintaining sinus rhythm. Continues on Xarelto.  Coronary atherosclerosis of native coronary artery History of BMS to the RCA in May 2014 in [place]. Recent LVEF 45-50% by followup echocardiogram.    Satira Sark, M.D., F.A.C.C.

## 2013-09-17 NOTE — Assessment & Plan Note (Signed)
Consistent with RCA distribution ischemia, overall normal LVEF. Technically low risk study. I am concerned however that she is reporting angina symptoms, we are somewhat limited in advancing her antianginal regimen as detailed above. As per above discussion, we will try and increase Toprol-XL to 37.5 mg daily, I will see her back in the next few weeks as already scheduled. Reminded her about using nitroglycerin as needed. She will let us know sooner if symptoms escalate.

## 2013-09-28 ENCOUNTER — Encounter: Payer: Self-pay | Admitting: Cardiology

## 2013-09-28 ENCOUNTER — Ambulatory Visit (INDEPENDENT_AMBULATORY_CARE_PROVIDER_SITE_OTHER): Payer: Medicare Other | Admitting: Cardiology

## 2013-09-28 VITALS — BP 124/73 | HR 62 | Ht 63.0 in | Wt 232.8 lb

## 2013-09-28 DIAGNOSIS — I25119 Atherosclerotic heart disease of native coronary artery with unspecified angina pectoris: Secondary | ICD-10-CM

## 2013-09-28 DIAGNOSIS — I10 Essential (primary) hypertension: Secondary | ICD-10-CM

## 2013-09-28 DIAGNOSIS — I209 Angina pectoris, unspecified: Secondary | ICD-10-CM

## 2013-09-28 DIAGNOSIS — I480 Paroxysmal atrial fibrillation: Secondary | ICD-10-CM

## 2013-09-28 DIAGNOSIS — I2510 Atherosclerotic heart disease of native coronary artery without angina pectoris: Secondary | ICD-10-CM

## 2013-09-28 DIAGNOSIS — I4891 Unspecified atrial fibrillation: Secondary | ICD-10-CM

## 2013-09-28 NOTE — Assessment & Plan Note (Signed)
History of BMS to the RCA in May 2014 in [place]. Recent LVEF 45-50% by followup echocardiogram. Plan is to continue medical therapy and observation in light of clinical stability, followup in 2 months. Certainly if she manifests progressive symptoms, we can proceed to heart catheterization.

## 2013-09-28 NOTE — Assessment & Plan Note (Signed)
Blood pressure control is good today.

## 2013-09-28 NOTE — Progress Notes (Signed)
Clinical Summary [name] is an 80 y.o.female just recently seen on September 10 in the [place] office. At that visit we discussed her recent cardiac testing, advanced beta blocker for antianginal effect. She comes in to review her symptoms.  Echocardiogram from September 9 reported moderate LVH with LVEF 45-50%, inferior and inferolateral hypokinesis, grade 1 diastolic dysfunction, mild to moderate left atrial enlargement, minor MAC with mild mitral regurgitation, mildly dilated aortic root, trivial tricuspid regurgitation, unable to assess PASP. Lexiscan Cardiolite also done on September 9 was overall low risk with evidence of inferior ischemia (moderate-sized defect in RCA distribution), mild inferior hypokinesis, and LVEF of 55%.  She comes in today with her daughter, tells me that she does feel better. Seems to also be less stress at home as her husband has been more calm with his dementia. He has had medication adjustments. She remains comfortable with observation and medical therapy for now.   Allergies  Allergen Reactions  . Penicillins     REACTION: rash, years ago  . Sinus & Allergy [Chlorpheniramine-Phenyleph Er]   . Demerol Rash    Current Outpatient Prescriptions  Medication Sig Dispense Refill  . acetaminophen (TYLENOL) 500 MG tablet Take 1,000 mg by mouth daily as needed for mild pain or headache.      Marland Kitchen aspirin 81 MG tablet Take 81 mg by mouth daily.      Marland Kitchen atorvastatin (LIPITOR) 20 MG tablet TAKE 1 TABLET BY MOUTH AT BEDTIME  30 tablet  3  . DELZICOL 400 MG CPDR DR capsule TAKE 2 CAPSULES BY MOUTH TWICE DAILY  180 capsule  11  . losartan (COZAAR) 25 MG tablet TAKE 1 TABLET BY MOUTH EVERY DAY  30 tablet  3  . Mesalamine (DELZICOL) 400 MG CPDR DR capsule Take 800 mg by mouth 2 (two) times daily.      . metoprolol succinate (TOPROL-XL) 25 MG 24 hr tablet Take 1.5 tablets (37.5 mg total) by mouth daily.  45 tablet  5  . nitroGLYCERIN (NITROSTAT) 0.3 MG SL tablet Place  1 tablet (0.3 mg total) under the tongue every 5 (five) minutes as needed.  25 tablet  1  . potassium chloride SA (K-DUR,KLOR-CON) 20 MEQ tablet Take 20 mEq by mouth 3 (three) times a week.      . triamterene-hydrochlorothiazide (DYAZIDE) 37.5-25 MG per capsule Take 1 capsule by mouth. On Monday, Wednesday, & Friday      . XARELTO 15 MG TABS tablet TAKE 1 TABLET BY MOUTH EVERY DAY  30 tablet  6   No current facility-administered medications for this visit.    Past Medical History  Diagnosis Date  . Atrial flutter   . Depression   . Essential hypertension, benign   . Arthritis   . Ulcerative colitis   . Paroxysmal atrial fibrillation   . Coronary atherosclerosis of native coronary artery     BMS RCA May 2014 - [place]  . Secondary cardiomyopathy     LVEF 35% - improved to 45-50%    Social History Ms. Demary reports that she has never smoked. She has never used smokeless tobacco. Ms. Hofstra reports that she does not drink alcohol.  Review of Systems Other systems reviewed and negative.  Physical Examination Filed Vitals:   09/28/13 1622  BP: 124/73  Pulse: 62   Filed Weights   09/28/13 1622  Weight: 232 lb 12.8 oz (105.597 kg)    She is in no distress.  Overweight woman in no acute distress.  HEENT: Conjunctiva and lids normal, oropharynx with moist mucosa.  Neck: Supple, no elevated JVP or carotid bruits.  Lungs: Clear to auscultation, nonlabored.  Cardiac: Irregularly irregular, no S3 or rub.  Abdomen: Soft, nontender, bowel sounds present.  Extremities: No pitting edema.    Problem List and Plan   Coronary atherosclerosis of native coronary artery History of BMS to the RCA in May 2014 in [place]. Recent LVEF 45-50% by followup echocardiogram. Plan is to continue medical therapy and observation in light of clinical stability, followup in 2 months. Certainly if she manifests progressive symptoms, we can proceed to heart catheterization.  Atrial  fibrillation Maintaining sinus rhythm. Continues on Xarelto.  Essential hypertension, benign Blood pressure control is good today.    Satira Sark, M.D., F.A.C.C.

## 2013-09-28 NOTE — Patient Instructions (Signed)
Continue all current medications. Follow up in  2 months

## 2013-09-28 NOTE — Assessment & Plan Note (Signed)
Maintaining sinus rhythm. Continues on Xarelto.

## 2013-10-05 ENCOUNTER — Telehealth: Payer: Self-pay | Admitting: *Deleted

## 2013-10-05 ENCOUNTER — Other Ambulatory Visit: Payer: Self-pay | Admitting: Cardiology

## 2013-10-05 NOTE — Telephone Encounter (Signed)
Patient informed that her imdur was stopped on 09/04/13 by Jory [place]. Patient understands that she is not on this medication.

## 2013-10-20 ENCOUNTER — Telehealth: Payer: Self-pay | Admitting: Cardiology

## 2013-10-20 NOTE — Telephone Encounter (Signed)
I will be covering [place] on Monday, and could see her later that morning.

## 2013-10-20 NOTE — Telephone Encounter (Signed)
Mr. [name] was seen by Dr. Nadara Mustard on 10-12 and was told that she needs to go ahead and schedule the catherization. Daughter states that patient is having some chest pains.  Please call daughter Caryl Asp)

## 2013-10-20 NOTE — Telephone Encounter (Signed)
Daughter informed. Appointment given for Monday, 10/26/13 @11 :20 am.

## 2013-10-20 NOTE — Telephone Encounter (Signed)
Patient has continued to have chest discomfort and after speaking with PCP on yesterday, he suggested that patient go ahead and proceed with heart cath. Daughter is requesting that she be seen on Monday morning if possible.

## 2013-10-23 ENCOUNTER — Other Ambulatory Visit: Payer: Self-pay

## 2013-10-26 ENCOUNTER — Encounter: Payer: Self-pay | Admitting: Cardiology

## 2013-10-26 ENCOUNTER — Ambulatory Visit (HOSPITAL_COMMUNITY)
Admission: RE | Admit: 2013-10-26 | Discharge: 2013-10-26 | Disposition: A | Discharge status: Home / Self Care | Payer: Medicare Other | Source: Ambulatory Visit | Attending: Cardiology | Admitting: Cardiology

## 2013-10-26 ENCOUNTER — Ambulatory Visit (INDEPENDENT_AMBULATORY_CARE_PROVIDER_SITE_OTHER): Payer: Medicare Other | Admitting: Cardiology

## 2013-10-26 ENCOUNTER — Other Ambulatory Visit: Payer: Self-pay | Admitting: Cardiology

## 2013-10-26 VITALS — BP 136/72 | HR 69 | Ht 63.0 in | Wt 229.0 lb

## 2013-10-26 DIAGNOSIS — R9439 Abnormal result of other cardiovascular function study: Secondary | ICD-10-CM

## 2013-10-26 DIAGNOSIS — Z01818 Encounter for other preprocedural examination: Secondary | ICD-10-CM

## 2013-10-26 DIAGNOSIS — E782 Mixed hyperlipidemia: Secondary | ICD-10-CM

## 2013-10-26 DIAGNOSIS — I480 Paroxysmal atrial fibrillation: Secondary | ICD-10-CM

## 2013-10-26 DIAGNOSIS — I10 Essential (primary) hypertension: Secondary | ICD-10-CM | POA: Insufficient documentation

## 2013-10-26 DIAGNOSIS — I25118 Atherosclerotic heart disease of native coronary artery with other forms of angina pectoris: Secondary | ICD-10-CM

## 2013-10-26 NOTE — Assessment & Plan Note (Signed)
On Toprol-XL and Xarelto.

## 2013-10-26 NOTE — Assessment & Plan Note (Signed)
Lexiscan Cardiolite from September demonstrated a moderate-sized region of ischemia in the RCA distribution. Patient has history of BMS to the RCA in May 2014 in [place]. She initially wanted to pursue medical management, however has had increasing angina symptoms. Plan is to refer her for diagnostic heart catheterization for clear evaluation of the coronary anatomy and to determine if there are any revascularization options to consider. It will need to be taken into account that she is on Xarelto with history of PAF, particularly if she ends up requiring additional percutaneous intervention and DAPT. She will stop her Xarelto 48 hours prior to the procedure.

## 2013-10-26 NOTE — Patient Instructions (Addendum)
Your physician recommends that you schedule a follow-up appointment in: will be scheduled after your cath which is scheduled for Friday October 23, arrive at 8:30 am at [facility] to be done by Dr.McAlhany at 10:30 am  Please get chest x-ray now  Your physician has requested that you have a cardiac catheterization. Cardiac catheterization is used to diagnose and/or treat various heart conditions. Doctors may recommend this procedure for a number of different reasons. The most common reason is to evaluate chest pain. Chest pain can be a symptom of coronary artery disease (CAD), and cardiac catheterization can show whether plaque is narrowing or blocking your heart's arteries. This procedure is also used to evaluate the valves, as well as measure the blood flow and oxygen levels in different parts of your heart. For further information please visit [email or web address]. Please follow instruction sheet, as given.      Thank you for choosing [facility] !

## 2013-10-26 NOTE — Assessment & Plan Note (Signed)
On Lipitor 

## 2013-10-26 NOTE — Assessment & Plan Note (Signed)
No change to current antihypertensive regimen.

## 2013-10-26 NOTE — Progress Notes (Signed)
Clinical Summary [name] is an 80 y.o.female last seen in September, at which time we continued medical therapy for management of angina in the setting of known CAD. Recently, patient's daughter reported to [place] that [name] was having increased angina symptoms and would like to be seen to discuss heart catheterization. They are both present for the visit today.  She states that she continues to have episodes of chest pain, sometimes prolonged and at rest. Not necessarily associated with stress or anxiety. We have been treating her medically so far.  Echocardiogram from September 9 reported moderate LVH with LVEF 45-50%, inferior and inferolateral hypokinesis, grade 1 diastolic dysfunction, mild to moderate left atrial enlargement, minor MAC with mild mitral regurgitation, mildly dilated aortic root, trivial tricuspid regurgitation, unable to assess PASP. Lexiscan Cardiolite also done on September 9 was overall low risk with evidence of inferior ischemia (moderate-sized defect in RCA distribution), mild inferior hypokinesis, and LVEF of 55%.  Today we discussed proceeding to heart catheterization for clear definition of the coronary anatomy and determination of any revascularization options that might need to be considered. This will need to be done off Xarelto (PAF).   Allergies  Allergen Reactions  . Penicillins     REACTION: rash, years ago  . Sinus & Allergy [Chlorpheniramine-Phenyleph Er]   . Demerol Rash    Current Outpatient Prescriptions  Medication Sig Dispense Refill  . acetaminophen (TYLENOL) 500 MG tablet Take 1,000 mg by mouth daily as needed for mild pain or headache.      Marland Kitchen aspirin 81 MG tablet Take 81 mg by mouth daily.      Marland Kitchen atorvastatin (LIPITOR) 20 MG tablet TAKE 1 TABLET BY MOUTH AT BEDTIME  30 tablet  3  . DELZICOL 400 MG CPDR DR capsule TAKE 2 CAPSULES BY MOUTH TWICE DAILY  180 capsule  11  . losartan (COZAAR) 25 MG tablet TAKE 1 TABLET BY MOUTH EVERY DAY  30  tablet  3  . Mesalamine (DELZICOL) 400 MG CPDR DR capsule Take 800 mg by mouth 2 (two) times daily.      . metoprolol succinate (TOPROL-XL) 25 MG 24 hr tablet Take 1.5 tablets (37.5 mg total) by mouth daily.  45 tablet  5  . nitroGLYCERIN (NITROSTAT) 0.3 MG SL tablet Place 1 tablet (0.3 mg total) under the tongue every 5 (five) minutes as needed.  25 tablet  1  . potassium chloride SA (K-DUR,KLOR-CON) 20 MEQ tablet Take 20 mEq by mouth 3 (three) times a week.      . triamterene-hydrochlorothiazide (DYAZIDE) 37.5-25 MG per capsule Take 1 capsule by mouth. On Monday, Wednesday, & Friday      . XARELTO 15 MG TABS tablet TAKE 1 TABLET BY MOUTH EVERY DAY  30 tablet  6   No current facility-administered medications for this visit.    Past Medical History  Diagnosis Date  . Atrial flutter   . Depression   . Essential hypertension, benign   . Arthritis   . Ulcerative colitis   . Paroxysmal atrial fibrillation   . Coronary atherosclerosis of native coronary artery     BMS RCA May 2014 - [place]  . Secondary cardiomyopathy     LVEF 35% - improved to 45-50%    Past Surgical History  Procedure Laterality Date  . Total knee arthroplasty    . Appendectomy    . Tonsillectomy    . Abdominal hysterectomy    . Bladder surgery    . Bilateral  knee replacements       2007, 2008  . Yag laser application Bilateral 21/01/9415    Procedure: YAG LASER APPLICATION;  Surgeon: Williams Che, MD;  Location: [facility];  Service: Ophthalmology;  Laterality: Bilateral;    History reviewed. No pertinent family history.  Social History [name] reports that she has never smoked. She has never used smokeless tobacco. [name] reports that she does not drink alcohol.  Review of Systems No palpitations or syncope. No bleeding episodes. No orthopnea or PND. No hospitalizations recently. Other systems reviewed and negative except as outlined.  Physical Examination Filed Vitals:   10/26/13 1124  BP:  136/72  Pulse: 69   Filed Weights   10/26/13 1124  Weight: 229 lb (103.874 kg)    Appears comfortable at rest. Overweight woman in no acute distress.  HEENT: Conjunctiva and lids normal, oropharynx with moist mucosa.  Neck: Supple, no elevated JVP or carotid bruits.  Lungs: Clear to auscultation, nonlabored.  Cardiac: Irregularly irregular, no S3 or rub.  Abdomen: Soft, nontender, bowel sounds present.  Extremities: No pitting edema.  Skin: Warm and dry. Musculoskeletal: No kyphosis. Neuropsychiatric: Alert and oriented x3, affect appropriate.   Problem List and Plan   Abnormal myocardial perfusion study Lexiscan Cardiolite from September demonstrated a moderate-sized region of ischemia in the RCA distribution. Patient has history of BMS to the RCA in May 2014 in [place]. She initially wanted to pursue medical management, however has had increasing angina symptoms. Plan is to refer her for diagnostic heart catheterization for clear evaluation of the coronary anatomy and to determine if there are any revascularization options to consider. It will need to be taken into account that she is on Xarelto with history of PAF, particularly if she ends up requiring additional percutaneous intervention and DAPT. She will stop her Xarelto 48 hours prior to the procedure.  Coronary atherosclerosis of native coronary artery History of BMS to RCA in May 2014 in [place].  Paroxysmal atrial fibrillation On Toprol-XL and Xarelto.  Essential hypertension, benign No change to current antihypertensive regimen.  Mixed hyperlipidemia On Lipitor.    Satira Sark, M.D., F.A.C.C.

## 2013-10-26 NOTE — Assessment & Plan Note (Signed)
History of BMS to RCA in May 2014 in [place].

## 2013-10-28 ENCOUNTER — Encounter (HOSPITAL_COMMUNITY): Payer: Self-pay | Admitting: Pharmacy Technician

## 2013-10-30 ENCOUNTER — Encounter (HOSPITAL_COMMUNITY)
Admission: RE | Disposition: A | Discharge status: Home / Self Care | Payer: Medicare Other | Source: Ambulatory Visit | Attending: Cardiovascular Disease

## 2013-10-30 ENCOUNTER — Ambulatory Visit (HOSPITAL_COMMUNITY)
Admission: RE | Admit: 2013-10-30 | Discharge: 2013-10-30 | Disposition: A | Discharge status: Home / Self Care | Payer: Medicare Other | Source: Ambulatory Visit | Attending: Cardiovascular Disease | Admitting: Cardiovascular Disease

## 2013-10-30 DIAGNOSIS — T82858A Stenosis of vascular prosthetic devices, implants and grafts, initial encounter: Secondary | ICD-10-CM | POA: Insufficient documentation

## 2013-10-30 DIAGNOSIS — Z7982 Long term (current) use of aspirin: Secondary | ICD-10-CM | POA: Diagnosis not present

## 2013-10-30 DIAGNOSIS — I4892 Unspecified atrial flutter: Secondary | ICD-10-CM | POA: Insufficient documentation

## 2013-10-30 DIAGNOSIS — I480 Paroxysmal atrial fibrillation: Secondary | ICD-10-CM | POA: Insufficient documentation

## 2013-10-30 DIAGNOSIS — I340 Nonrheumatic mitral (valve) insufficiency: Secondary | ICD-10-CM | POA: Insufficient documentation

## 2013-10-30 DIAGNOSIS — I2510 Atherosclerotic heart disease of native coronary artery without angina pectoris: Secondary | ICD-10-CM | POA: Diagnosis not present

## 2013-10-30 DIAGNOSIS — R079 Chest pain, unspecified: Secondary | ICD-10-CM

## 2013-10-30 DIAGNOSIS — I10 Essential (primary) hypertension: Secondary | ICD-10-CM | POA: Diagnosis not present

## 2013-10-30 DIAGNOSIS — I25118 Atherosclerotic heart disease of native coronary artery with other forms of angina pectoris: Secondary | ICD-10-CM

## 2013-10-30 DIAGNOSIS — Z7901 Long term (current) use of anticoagulants: Secondary | ICD-10-CM | POA: Insufficient documentation

## 2013-10-30 DIAGNOSIS — R9439 Abnormal result of other cardiovascular function study: Secondary | ICD-10-CM | POA: Diagnosis present

## 2013-10-30 DIAGNOSIS — I712 Thoracic aortic aneurysm, without rupture: Secondary | ICD-10-CM | POA: Diagnosis not present

## 2013-10-30 DIAGNOSIS — E782 Mixed hyperlipidemia: Secondary | ICD-10-CM | POA: Diagnosis not present

## 2013-10-30 DIAGNOSIS — I429 Cardiomyopathy, unspecified: Secondary | ICD-10-CM | POA: Diagnosis not present

## 2013-10-30 HISTORY — PX: LEFT HEART CATHETERIZATION WITH CORONARY ANGIOGRAM: SHX5451

## 2013-10-30 LAB — CBC
HCT: 38.1 % (ref 36.0–46.0)
Hemoglobin: 13.2 g/dL (ref 12.0–15.0)
MCH: 29.7 pg (ref 26.0–34.0)
MCHC: 34.6 g/dL (ref 30.0–36.0)
MCV: 85.6 fL (ref 78.0–100.0)
PLATELETS: 258 10*3/uL (ref 150–400)
RBC: 4.45 MIL/uL (ref 3.87–5.11)
RDW: 13.6 % (ref 11.5–15.5)
WBC: 7.3 10*3/uL (ref 4.0–10.5)

## 2013-10-30 LAB — BASIC METABOLIC PANEL
Anion gap: 13 (ref 5–15)
BUN: 16 mg/dL (ref 6–23)
CO2: 25 mEq/L (ref 19–32)
Calcium: 9.6 mg/dL (ref 8.4–10.5)
Chloride: 102 mEq/L (ref 96–112)
Creatinine, Ser: 0.73 mg/dL (ref 0.50–1.10)
GFR calc non Af Amer: 78 mL/min — ABNORMAL LOW (ref >90)
Glucose, Bld: 99 mg/dL (ref 70–99)
POTASSIUM: 4.1 meq/L (ref 3.7–5.3)
SODIUM: 140 meq/L (ref 137–147)

## 2013-10-30 SURGERY — LEFT HEART CATHETERIZATION WITH CORONARY ANGIOGRAM
Anesthesia: LOCAL

## 2013-10-30 MED ORDER — SODIUM CHLORIDE 0.9 % IJ SOLN: 3.0000 mL | Freq: Two times a day (BID) | INTRAMUSCULAR | Status: DC

## 2013-10-30 MED ORDER — SODIUM CHLORIDE 0.9 % IV SOLN: 250.0000 mL | INTRAVENOUS | Status: DC | PRN

## 2013-10-30 MED ORDER — SODIUM CHLORIDE 0.9 % IJ SOLN: 3.0000 mL | INTRAMUSCULAR | Status: DC | PRN

## 2013-10-30 MED ORDER — FENTANYL CITRATE 0.05 MG/ML IJ SOLN
INTRAMUSCULAR | Status: AC
  Filled 2013-10-30: qty 2

## 2013-10-30 MED ORDER — NITROGLYCERIN 1 MG/10 ML FOR IR/CATH LAB
INTRA_ARTERIAL | Status: AC
  Filled 2013-10-30: qty 10

## 2013-10-30 MED ORDER — HEPARIN SODIUM (PORCINE) 1000 UNIT/ML IJ SOLN
INTRAMUSCULAR | Status: AC
  Filled 2013-10-30: qty 1

## 2013-10-30 MED ORDER — SODIUM CHLORIDE 0.9 % IV SOLN
INTRAVENOUS | Status: DC
  Administered 2013-10-30: 10:00:00 via INTRAVENOUS

## 2013-10-30 MED ORDER — ASPIRIN 81 MG PO CHEW: 81.0000 mg | CHEWABLE_TABLET | ORAL | Status: DC

## 2013-10-30 MED ORDER — HEPARIN (PORCINE) IN NACL 2-0.9 UNIT/ML-% IJ SOLN
INTRAMUSCULAR | Status: AC
  Filled 2013-10-30: qty 1500

## 2013-10-30 MED ORDER — VERAPAMIL HCL 2.5 MG/ML IV SOLN
INTRAVENOUS | Status: AC
  Filled 2013-10-30: qty 2

## 2013-10-30 MED ORDER — LIDOCAINE HCL (PF) 1 % IJ SOLN
INTRAMUSCULAR | Status: AC
  Filled 2013-10-30: qty 30

## 2013-10-30 MED ORDER — MIDAZOLAM HCL 2 MG/2ML IJ SOLN
INTRAMUSCULAR | Status: AC
  Filled 2013-10-30: qty 2

## 2013-10-30 NOTE — Discharge Instructions (Signed)
Resume Xarelto on 10/31/13 if no bleeding from her right groin or right wrist.      Angiogram, Care After Refer to this sheet in the next few weeks. These instructions provide you with information on caring for yourself after your procedure. Your health care provider may also give you more specific instructions. Your treatment has been planned according to current medical practices, but problems sometimes occur. Call your health care provider if you have any problems or questions after your procedure.  WHAT TO EXPECT AFTER THE PROCEDURE After your procedure, it is typical to have the following sensations:  Minor discomfort or tenderness and a small bump at the catheter insertion site. The bump should usually decrease in size and tenderness within 1 to 2 weeks.  Any bruising will usually fade within 2 to 4 weeks. HOME CARE INSTRUCTIONS   You may need to keep taking blood thinners if they were prescribed for you. Take medicines only as directed by your health care provider.  Do not apply powder or lotion to the site.  Do not take baths, swim, or use a hot tub until your health care provider approves.  You may shower 24 hours after the procedure. Remove the bandage (dressing) and gently wash the site with plain soap and water. Gently pat the site dry.  Inspect the site at least twice daily.  Limit your activity for the first 48 hours. Do not bend, squat, or lift anything over 20 lb (9 kg) or as directed by your health care provider.  Plan to have someone take you home after the procedure. Follow instructions about when you can drive or return to work. SEEK MEDICAL CARE IF:  You get light-headed when standing up.  You have drainage (other than a small amount of blood on the dressing).  You have chills.  You have a fever.  You have redness, warmth, swelling, or pain at the insertion site. SEEK IMMEDIATE MEDICAL CARE IF:   You develop chest pain or shortness of breath, feel faint,  or pass out.  You have bleeding, swelling larger than a walnut, or drainage from the catheter insertion site.  You develop pain, discoloration, coldness, or severe bruising in the leg or arm that held the catheter.  You develop bleeding from any other place, such as the bowels. You may see bright red blood in your urine or stools, or your stools may appear black and tarry.  You have heavy bleeding from the site. If this happens, hold pressure on the site. MAKE SURE YOU:  Understand these instructions.  Will watch your condition.  Will get help right away if you are not doing well or get worse. Document Released: 07/13/2004 Document Revised: 05/11/2013 Document Reviewed: 05/19/2012 [facility] Patient Information 2015 [place], [place]. This information is not intended to replace advice given to you by your health care provider. Make sure you discuss any questions you have with your health care provider.    Radial Site Care Refer to this sheet in the next few weeks. These instructions provide you with information on caring for yourself after your procedure. Your caregiver may also give you more specific instructions. Your treatment has been planned according to current medical practices, but problems sometimes occur. Call your caregiver if you have any problems or questions after your procedure. HOME CARE INSTRUCTIONS  You may shower the day after the procedure.Remove the bandage (dressing) and gently wash the site with plain soap and water.Gently pat the site dry.  Do not apply  powder or lotion to the site.  Do not submerge the affected site in water for 3 to 5 days.  Inspect the site at least twice daily.  Do not flex or bend the affected arm for 24 hours.  No lifting over 5 pounds (2.3 kg) for 5 days after your procedure.  Do not drive home if you are discharged the same day of the procedure. Have someone else drive you.  You may drive 24 hours after the procedure unless  otherwise instructed by your caregiver.  Do not operate machinery or power tools for 24 hours.  A responsible adult should be with you for the first 24 hours after you arrive home. What to expect:  Any bruising will usually fade within 1 to 2 weeks.  Blood that collects in the tissue (hematoma) may be painful to the touch. It should usually decrease in size and tenderness within 1 to 2 weeks. SEEK IMMEDIATE MEDICAL CARE IF:  You have unusual pain at the radial site.  You have redness, warmth, swelling, or pain at the radial site.  You have drainage (other than a small amount of blood on the dressing).  You have chills.  You have a fever or persistent symptoms for more than 72 hours.  You have a fever and your symptoms suddenly get worse.  Your arm becomes pale, cool, tingly, or numb.  You have heavy bleeding from the site. Hold pressure on the site. Document Released: 01/27/2010 Document Revised: 03/19/2011 Document Reviewed: 01/27/2010 [facility] Patient Information 2015 [place], [place]. This information is not intended to replace advice given to you by your health care provider. Make sure you discuss any questions you have with your health care provider.

## 2013-10-30 NOTE — CV Procedure (Signed)
     Cardiac Catheterization Operative Report  [name] [id number] 10/23/201512:04 PM Rory Percy, MD  Procedure Performed:  1. Left Heart Catheterization 2. Selective Coronary Angiography 3. Mynx closure device right femoral artery  Operator: Lauree Chandler, MD  Indication:  80 yo female with history of CAD with BMS placed in the RCA in [place] in 2014 (no cath report available), PAF, HTN, ischemic CM with recent chest pain concerning for angina.                                   Procedure Details: The risks, benefits, complications, treatment options, and expected outcomes were discussed with the patient. The patient and/or family concurred with the proposed plan, giving informed consent. The patient was brought to the cath lab after IV hydration was begun and oral premedication was given. The patient was further sedated with Versed and Fentanyl. Initially, access was obtained in the right radial artery but I could not pass the wire. Angiography confirmed stenosis in the radial artery. This did not resolve with NTG IV. I then elected to proceed to access in the right femoral artery. Terumo hemostasis band applied to the arteriotomy in the right wrist. The right groin was prepped and draped in the usual manner. Using the modified Seldinger access technique, a 5 French sheath was placed in the right femoral artery. The left main was engaged with a JL3.5 catheter. The RCA was engaged with a AL-1 catheter (could not engaged with a JR4, NOTorque Right, AR1). Selective coronary angiography was performed. The aortic valve was crossed with the JR4 catheter and pressures were measured. No LV gram was performed.   There were no immediate complications. The patient was taken to the recovery area in stable condition.   Hemodynamic Findings: Central aortic pressure: 138/63 Left ventricular pressure: 136/10/17  Angiographic Findings:  Left main: 10% distal stenosis.   Left  Anterior Descending Artery:  Large caliber vessel that gives off a bifurcating moderate caliber septal perforating branch and then continuation to the apex with one moderate caliber diagonal branch. The proximal LAD has mild plaque. The mid LAD has diffuse 20% stenosis. The distal vessel has no obstructive disease. The diagonal branch is moderate in caliber with mild plaque.    Circumflex Artery: Moderate caliber vessel with 20% ostial stenosis, moderate caliber obtuse marginal branch with mild plaque. The intermediate branch is large in caliber with mild diffuse plaque.   Right Coronary Artery:  Large dominant vessel. The proximal vessel has mild luminal irregularities. The mid stented segment is patent with mild, 20% diffuse stent restenosis. The distal vessel, PDA and PLA have mild luminal irregularities.   Left Ventricular Angiogram: Deferred.   Impression: 1. Single vessel CAD with patent stent mid RCA, mild restenosis in the stented segment.  2. Mild non-obstructive disease in the LAD and Circumflex  Recommendations: Continue medical management of CAD.        Complications:  None. The patient tolerated the procedure well.

## 2013-10-30 NOTE — H&P (View-Only) (Signed)
Clinical Summary [name] is an 80 y.o.female last seen in September, at which time we continued medical therapy for management of angina in the setting of known CAD. Recently, patient's daughter reported to [place] that [name] was having increased angina symptoms and would like to be seen to discuss heart catheterization. They are both present for the visit today.  She states that she continues to have episodes of chest pain, sometimes prolonged and at rest. Not necessarily associated with stress or anxiety. We have been treating her medically so far.  Echocardiogram from September 9 reported moderate LVH with LVEF 45-50%, inferior and inferolateral hypokinesis, grade 1 diastolic dysfunction, mild to moderate left atrial enlargement, minor MAC with mild mitral regurgitation, mildly dilated aortic root, trivial tricuspid regurgitation, unable to assess PASP. Lexiscan Cardiolite also done on September 9 was overall low risk with evidence of inferior ischemia (moderate-sized defect in RCA distribution), mild inferior hypokinesis, and LVEF of 55%.  Today we discussed proceeding to heart catheterization for clear definition of the coronary anatomy and determination of any revascularization options that might need to be considered. This will need to be done off Xarelto (PAF).   Allergies  Allergen Reactions  . Penicillins     REACTION: rash, years ago  . Sinus & Allergy [Chlorpheniramine-Phenyleph Er]   . Demerol Rash    Current Outpatient Prescriptions  Medication Sig Dispense Refill  . acetaminophen (TYLENOL) 500 MG tablet Take 1,000 mg by mouth daily as needed for mild pain or headache.      Marland Kitchen aspirin 81 MG tablet Take 81 mg by mouth daily.      Marland Kitchen atorvastatin (LIPITOR) 20 MG tablet TAKE 1 TABLET BY MOUTH AT BEDTIME  30 tablet  3  . DELZICOL 400 MG CPDR DR capsule TAKE 2 CAPSULES BY MOUTH TWICE DAILY  180 capsule  11  . losartan (COZAAR) 25 MG tablet TAKE 1 TABLET BY MOUTH EVERY DAY  30  tablet  3  . Mesalamine (DELZICOL) 400 MG CPDR DR capsule Take 800 mg by mouth 2 (two) times daily.      . metoprolol succinate (TOPROL-XL) 25 MG 24 hr tablet Take 1.5 tablets (37.5 mg total) by mouth daily.  45 tablet  5  . nitroGLYCERIN (NITROSTAT) 0.3 MG SL tablet Place 1 tablet (0.3 mg total) under the tongue every 5 (five) minutes as needed.  25 tablet  1  . potassium chloride SA (K-DUR,KLOR-CON) 20 MEQ tablet Take 20 mEq by mouth 3 (three) times a week.      . triamterene-hydrochlorothiazide (DYAZIDE) 37.5-25 MG per capsule Take 1 capsule by mouth. On Monday, Wednesday, & Friday      . XARELTO 15 MG TABS tablet TAKE 1 TABLET BY MOUTH EVERY DAY  30 tablet  6   No current facility-administered medications for this visit.    Past Medical History  Diagnosis Date  . Atrial flutter   . Depression   . Essential hypertension, benign   . Arthritis   . Ulcerative colitis   . Paroxysmal atrial fibrillation   . Coronary atherosclerosis of native coronary artery     BMS RCA May 2014 - [place]  . Secondary cardiomyopathy     LVEF 35% - improved to 45-50%    Past Surgical History  Procedure Laterality Date  . Total knee arthroplasty    . Appendectomy    . Tonsillectomy    . Abdominal hysterectomy    . Bladder surgery    . Bilateral  knee replacements       2007, 2008  . Yag laser application Bilateral 82/06/4156    Procedure: YAG LASER APPLICATION;  Surgeon: Williams Che, MD;  Location: [facility];  Service: Ophthalmology;  Laterality: Bilateral;    History reviewed. No pertinent family history.  Social History [name] reports that she has never smoked. She has never used smokeless tobacco. [name] reports that she does not drink alcohol.  Review of Systems No palpitations or syncope. No bleeding episodes. No orthopnea or PND. No hospitalizations recently. Other systems reviewed and negative except as outlined.  Physical Examination Filed Vitals:   10/26/13 1124  BP:  136/72  Pulse: 69   Filed Weights   10/26/13 1124  Weight: 229 lb (103.874 kg)    Appears comfortable at rest. Overweight woman in no acute distress.  HEENT: Conjunctiva and lids normal, oropharynx with moist mucosa.  Neck: Supple, no elevated JVP or carotid bruits.  Lungs: Clear to auscultation, nonlabored.  Cardiac: Irregularly irregular, no S3 or rub.  Abdomen: Soft, nontender, bowel sounds present.  Extremities: No pitting edema.  Skin: Warm and dry. Musculoskeletal: No kyphosis. Neuropsychiatric: Alert and oriented x3, affect appropriate.   Problem List and Plan   Abnormal myocardial perfusion study Lexiscan Cardiolite from September demonstrated a moderate-sized region of ischemia in the RCA distribution. Patient has history of BMS to the RCA in May 2014 in [place]. She initially wanted to pursue medical management, however has had increasing angina symptoms. Plan is to refer her for diagnostic heart catheterization for clear evaluation of the coronary anatomy and to determine if there are any revascularization options to consider. It will need to be taken into account that she is on Xarelto with history of PAF, particularly if she ends up requiring additional percutaneous intervention and DAPT. She will stop her Xarelto 48 hours prior to the procedure.  Coronary atherosclerosis of native coronary artery History of BMS to RCA in May 2014 in [place].  Paroxysmal atrial fibrillation On Toprol-XL and Xarelto.  Essential hypertension, benign No change to current antihypertensive regimen.  Mixed hyperlipidemia On Lipitor.    Satira Sark, M.D., F.A.C.C.

## 2013-10-30 NOTE — Interval H&P Note (Signed)
History and Physical Interval Note:  10/30/2013 9:51 AM  [name]  has presented today for cardiac cath with the diagnosis of abnormal stress test  The various methods of treatment have been discussed with the patient and family. After consideration of risks, benefits and other options for treatment, the patient has consented to  Procedure(s): LEFT HEART CATHETERIZATION WITH CORONARY ANGIOGRAM (N/A) as a surgical intervention .  The patient's history has been reviewed, patient examined, no change in status, stable for surgery.  I have reviewed the patient's chart and labs.  Questions were answered to the patient's satisfaction.     Chares Slaymaker

## 2013-10-30 NOTE — Interval H&P Note (Signed)
History and Physical Interval Note:  10/30/2013 10:49 AM  [name]  has presented today for cardiac cath with the diagnosis of chest pain, abnormal stress test  The various methods of treatment have been discussed with the patient and family. After consideration of risks, benefits and other options for treatment, the patient has consented to  Procedure(s): LEFT HEART CATHETERIZATION WITH CORONARY ANGIOGRAM (N/A) as a surgical intervention .  The patient's history has been reviewed, patient examined, no change in status, stable for surgery.  I have reviewed the patient's chart and labs.  Questions were answered to the patient's satisfaction.    Cath Lab Visit (complete for each Cath Lab visit)  Clinical Evaluation Leading to the Procedure:   ACS: No.  Non-ACS:    Anginal Classification: CCS III  Anti-ischemic medical therapy: Minimal Therapy (1 class of medications)  Non-Invasive Test Results: No non-invasive testing performed  Prior CABG: No previous CABG        Talha Iser

## 2013-11-02 ENCOUNTER — Other Ambulatory Visit: Payer: Self-pay | Admitting: Cardiology

## 2013-11-09 ENCOUNTER — Telehealth: Payer: Self-pay | Admitting: *Deleted

## 2013-11-09 NOTE — Telephone Encounter (Signed)
If Curt Bears has availability this week to see her for a post catheterization follow-up that would be great. Of note, her heart catheterization actually was reassuring, so the plan should be to continue medical therapy. I would agree with using the TED hose more regularly to help her leg edema.

## 2013-11-09 NOTE — Telephone Encounter (Signed)
Patient informed and said that she was doing fine other than the swelling and the cath site was also good. Patient will keep already scheduled appointment on 11/30/13.

## 2013-11-09 NOTE — Telephone Encounter (Signed)
Patient has noticed more swelling in legs and feet. Patient says that she hasn't been wearing her ted hose daily and notices the swelling more when she's not wearing the ted hose. Patient also said that she needed a 2 week post cath visit from 10/30/13. Patient was instructed to call our office to get this appointment and forgot about it until today. Nurse advised patient to wear her ted hose daily, keep feet and legs elevated at rest and to refrain from elevated sodium foods. Patient is scheduled for an appointment on 11/30/13.

## 2013-11-30 ENCOUNTER — Encounter: Payer: Self-pay | Admitting: Cardiology

## 2013-11-30 ENCOUNTER — Ambulatory Visit (INDEPENDENT_AMBULATORY_CARE_PROVIDER_SITE_OTHER): Payer: Medicare Other | Admitting: Cardiology

## 2013-11-30 VITALS — BP 111/74 | HR 72 | Ht 63.0 in | Wt 231.8 lb

## 2013-11-30 DIAGNOSIS — R609 Edema, unspecified: Secondary | ICD-10-CM

## 2013-11-30 DIAGNOSIS — R600 Localized edema: Secondary | ICD-10-CM | POA: Insufficient documentation

## 2013-11-30 DIAGNOSIS — I429 Cardiomyopathy, unspecified: Secondary | ICD-10-CM

## 2013-11-30 DIAGNOSIS — I2510 Atherosclerotic heart disease of native coronary artery without angina pectoris: Secondary | ICD-10-CM

## 2013-11-30 DIAGNOSIS — I10 Essential (primary) hypertension: Secondary | ICD-10-CM

## 2013-11-30 MED ORDER — TRIAMTERENE-HCTZ 37.5-25 MG PO TABS: 1.0000 | ORAL_TABLET | Freq: Every day | ORAL | Status: DC

## 2013-11-30 MED ORDER — POTASSIUM CHLORIDE CRYS ER 20 MEQ PO TBCR: 10.0000 meq | EXTENDED_RELEASE_TABLET | Freq: Every day | ORAL | Status: DC

## 2013-11-30 NOTE — Progress Notes (Signed)
Reason for visit:  CAD  Clinical Summary [name] is an 80 y.o.female last seen in October.  She was referred for a diagnostic heart catheterization in light of angina symptoms and abnormal noninvasive testing to assess for any revascularization options. Fortunately, she was noted to have patent stent site in the mid RCA with otherwise mild restenosis and nonobstructive disease in the LAD and circumflex. Medical therapy was recommended.  She is here with [name] daughter today, does not endorse any angina specifically, however complains of leg edema and generally feeling fatigued. She states that she she has things that she wants to do, but does not have the energy, also feels that [name] house is too hot, [name] husband keeps thermostat turned up.  We reviewed [name] medications. She has been using compression hose recently. Diuretics are used only 3 times a week.  Echocardiogram from September 9 reported moderate LVH with LVEF 45-50%, inferior and inferolateral hypokinesis, grade 1 diastolic dysfunction, mild to moderate left atrial enlargement, minor MAC with mild mitral regurgitation, mildly dilated aortic root, trivial tricuspid regurgitation, unable to assess PASP. Lexiscan Cardiolite also done on September 9 was overall low risk with evidence of inferior ischemia (moderate-sized defect in RCA distribution), mild inferior hypokinesis, and LVEF of 55%.  Allergies  Allergen Reactions  . Sinus & Allergy [Chlorpheniramine-Phenylephrine] Shortness Of Breath  . Demerol Rash  . Penicillins Rash and Other (See Comments)    REACTION: rash, years ago    Current Outpatient Prescriptions  Medication Sig Dispense Refill  . acetaminophen (TYLENOL) 500 MG tablet Take 1,000 mg by mouth every 8 (eight) hours as needed for mild pain or headache.     Marland Kitchen aspirin 81 MG tablet Take 81 mg by mouth daily.    Marland Kitchen atorvastatin (LIPITOR) 20 MG tablet Take 20 mg by mouth daily.    Marland Kitchen atorvastatin (LIPITOR) 20 MG tablet TAKE  1 TABLET BY MOUTH AT BEDTIME 30 tablet 3  . ketotifen (THERA TEARS ALLERGY) 0.025 % ophthalmic solution Place 2 drops into both eyes as needed (for dry eyes).    Marland Kitchen losartan (COZAAR) 25 MG tablet Take 25 mg by mouth daily.    . Mesalamine (DELZICOL) 400 MG CPDR DR capsule Take 800 mg by mouth 2 (two) times daily.    . metoprolol succinate (TOPROL-XL) 25 MG 24 hr tablet Take 1.5 tablets (37.5 mg total) by mouth daily. 45 tablet 5  . nitroGLYCERIN (NITROSTAT) 0.3 MG SL tablet Place 0.3 mg under the tongue every 5 (five) minutes as needed for chest pain.    . potassium chloride SA (K-DUR,KLOR-CON) 20 MEQ tablet Take 0.5 tablets (10 mEq total) by mouth daily. 45 tablet 3  . Rivaroxaban (XARELTO) 15 MG TABS tablet Take 15 mg by mouth daily.    Marland Kitchen triamterene-hydrochlorothiazide (MAXZIDE-25) 37.5-25 MG per tablet Take 1 tablet by mouth daily. 30 tablet 6   No current facility-administered medications for this visit.    Past Medical History  Diagnosis Date  . Atrial flutter   . Depression   . Essential hypertension, benign   . Arthritis   . Ulcerative colitis   . Paroxysmal atrial fibrillation   . Coronary atherosclerosis of native coronary artery     BMS RCA May 2014 - [place]  . Secondary cardiomyopathy     LVEF 35% - improved to 45-50%    Social History [name] reports that she has never smoked. She has never used smokeless tobacco. [name] reports that she does not drink  alcohol.  Review of Systems Complete review of systems negative except as otherwise outlined in the clinical summary.  Physical Examination Filed Vitals:   11/30/13 1612  BP: 111/74  Pulse: 72   Filed Weights   11/30/13 1612  Weight: 231 lb 12.8 oz (105.144 kg)    Appears comfortable at rest. Overweight woman in no acute distress.  HEENT: Conjunctiva and lids normal, oropharynx with moist mucosa.  Neck: Supple, no elevated JVP or carotid bruits.  Lungs: Clear to auscultation, nonlabored.   Cardiac: Irregularly irregular, no S3 or rub.  Abdomen: Soft, nontender, bowel sounds present.  Extremities: 1-2+ edema and possibly mild lymphedema.  Skin: Warm and dry. Musculoskeletal: No kyphosis. Neuropsychiatric: Alert and oriented x3, affect appropriate.   Problem List and Plan   Bilateral leg edema Increase Maxide and potassium supplements to daily from 3 times a week. Continue to use compression hose, put them on in the morning before swelling gets worse. If symptoms do not improve over the next few weeks, we might consider changing to Lasix.  Coronary atherosclerosis of native coronary artery Stable recent cardiac catheterization with patent mid RCA stent site and otherwise mild restenosis with residual nonobstructive disease in the LAD and circumflex. Continue medical therapy.  Secondary cardiomyopathy LVEF most recently 45-50% range.    Satira Sark, M.D., F.A.C.C.

## 2013-11-30 NOTE — Assessment & Plan Note (Signed)
LVEF most recently 45-50% range.

## 2013-11-30 NOTE — Patient Instructions (Addendum)
   Increase Potassium to DAILY  Increased Maxzide to DAILY  Refill sent to pharm on above for increase in doses.   Continue all other medications.   Lab for BMET - due just prior to next visit Call office in a few weeks with update on edema in legs.  Follow up in  4 months

## 2013-11-30 NOTE — Assessment & Plan Note (Signed)
Increase Maxide and potassium supplements to daily from 3 times a week. Continue to use compression hose, put them on in the morning before swelling gets worse. If symptoms do not improve over the next few weeks, we might consider changing to Lasix.

## 2013-11-30 NOTE — Assessment & Plan Note (Signed)
Stable recent cardiac catheterization with patent mid RCA stent site and otherwise mild restenosis with residual nonobstructive disease in the LAD and circumflex. Continue medical therapy.

## 2013-12-02 ENCOUNTER — Other Ambulatory Visit: Payer: Self-pay | Admitting: Cardiology

## 2013-12-17 ENCOUNTER — Encounter (HOSPITAL_COMMUNITY): Payer: Self-pay | Admitting: Cardiovascular Disease

## 2014-01-20 ENCOUNTER — Encounter (INDEPENDENT_AMBULATORY_CARE_PROVIDER_SITE_OTHER): Payer: Self-pay | Admitting: *Deleted

## 2014-03-02 ENCOUNTER — Other Ambulatory Visit: Payer: Self-pay | Admitting: Cardiology

## 2014-03-19 ENCOUNTER — Encounter: Payer: Self-pay | Admitting: *Deleted

## 2014-03-22 ENCOUNTER — Ambulatory Visit (INDEPENDENT_AMBULATORY_CARE_PROVIDER_SITE_OTHER): Payer: Medicare Other | Admitting: Cardiology

## 2014-03-22 ENCOUNTER — Encounter: Payer: Self-pay | Admitting: Cardiology

## 2014-03-22 VITALS — BP 120/72 | HR 67 | Ht 63.0 in | Wt 228.8 lb

## 2014-03-22 DIAGNOSIS — I480 Paroxysmal atrial fibrillation: Secondary | ICD-10-CM

## 2014-03-22 DIAGNOSIS — I10 Essential (primary) hypertension: Secondary | ICD-10-CM

## 2014-03-22 DIAGNOSIS — I2510 Atherosclerotic heart disease of native coronary artery without angina pectoris: Secondary | ICD-10-CM

## 2014-03-22 NOTE — Progress Notes (Signed)
Cardiology Office Note  Date: 03/22/2014   ID: [name], DOB 1930-07-13, MRN [id number]  PCP: [name] Percy, MD  Primary Cardiologist: [name] Lesches, MD   Chief Complaint  Patient presents with  . Coronary Artery Disease  . Cardiomyopathy  . PAF    History of Present Illness: [name] is an 81 y.o. female last seen in November 2015. She is here with her son today for a follow-up visit. Other than not having the energy that she would like, she seems to be doing reasonably well. She remains functional in her ADLs, does not endorse any angina symptoms. She lives at home with her husband, and tells me that he has been doing better, has been more functional, there is been less stress in the household. She did have a fall recently, lost her balance while getting some groceries into the house. No obvious major injuries.  We reviewed her medications. There have been no significant changes. She did have recent follow-up lab work which is outlined below.   Past Medical History  Diagnosis Date  . Atrial flutter   . Depression   . Essential hypertension, benign   . Arthritis   . Ulcerative colitis   . Paroxysmal atrial fibrillation   . Coronary atherosclerosis of native coronary artery     BMS RCA May 2014 - [place]  . Secondary cardiomyopathy     LVEF 35% - improved to 45-50%    Past Surgical History  Procedure Laterality Date  . Total knee arthroplasty    . Appendectomy    . Tonsillectomy    . Abdominal hysterectomy    . Bladder surgery    . Bilateral knee replacements       2007, 2008  . Yag laser application Bilateral 15/09/4583    Procedure: YAG LASER APPLICATION;  Surgeon: [name] Che, MD;  Location: [facility];  Service: Ophthalmology;  Laterality: Bilateral;  . Left heart catheterization with coronary angiogram N/A 10/30/2013    Procedure: LEFT HEART CATHETERIZATION WITH CORONARY ANGIOGRAM;  Surgeon: [name] Blanks, MD;  Location: [facility] CATH  LAB;  Service: Cardiovascular;  Laterality: N/A;    Current Outpatient Prescriptions  Medication Sig Dispense Refill  . acetaminophen (TYLENOL) 500 MG tablet Take 1,000 mg by mouth every 8 (eight) hours as needed for mild pain or headache.     Marland Kitchen aspirin 81 MG tablet Take 81 mg by mouth daily.    Marland Kitchen atorvastatin (LIPITOR) 20 MG tablet Take 20 mg by mouth daily.    Marland Kitchen ketotifen (THERA TEARS ALLERGY) 0.025 % ophthalmic solution Place 2 drops into both eyes as needed (for dry eyes).    Marland Kitchen losartan (COZAAR) 25 MG tablet Take 25 mg by mouth daily.    . Mesalamine (DELZICOL) 400 MG CPDR DR capsule Take 800 mg by mouth 2 (two) times daily.    . metoprolol succinate (TOPROL-XL) 25 MG 24 hr tablet TAKE 1 AND 1/2 TABLETS BY MOUTH DAILY. 45 tablet 3  . nitroGLYCERIN (NITROSTAT) 0.3 MG SL tablet Place 0.3 mg under the tongue every 5 (five) minutes as needed for chest pain.    . potassium chloride SA (K-DUR,KLOR-CON) 20 MEQ tablet Take 0.5 tablets (10 mEq total) by mouth daily. 45 tablet 3  . Rivaroxaban (XARELTO) 15 MG TABS tablet Take 15 mg by mouth daily.    Marland Kitchen triamterene-hydrochlorothiazide (MAXZIDE-25) 37.5-25 MG per tablet Take 1 tablet by mouth daily. 30 tablet 6   No current facility-administered medications for this  visit.    Allergies:  Sinus & allergy; Demerol; and Penicillins   Social History: The patient  reports that she has never smoked. She has never used smokeless tobacco. She reports that she does not drink alcohol or use illicit drugs.   ROS:  Please see the history of present illness. Otherwise, complete review of systems is positive for leg edema which is chronic and dependent in nature.  All other systems are reviewed and negative.    Physical Exam: VS:  BP 120/72 mmHg  Pulse 67  Ht 5' 3"  (1.6 m)  Wt 228 lb 12.8 oz (103.783 kg)  BMI 40.54 kg/m2  SpO2 95%, BMI Body mass index is 40.54 kg/(m^2).  Wt Readings from Last 3 Encounters:  03/22/14 228 lb 12.8 oz (103.783 kg)    11/30/13 231 lb 12.8 oz (105.144 kg)  10/30/13 229 lb (103.874 kg)     Appears comfortable at rest. Overweight woman in no acute distress.  HEENT: Conjunctiva and lids normal, oropharynx with moist mucosa.  Neck: Supple, no elevated JVP or carotid bruits.  Lungs: Clear to auscultation, nonlabored.  Cardiac: Irregularly irregular, no S3 or rub.  Abdomen: Soft, nontender, bowel sounds present.  Extremities: 1+ edema and possibly mild lymphedema.  Skin: Warm and dry. Musculoskeletal: No kyphosis. Neuropsychiatric: Alert and oriented x3, affect appropriate.   ECG: ECG is not ordered today.   Recent Labwork:  Recent lab work from [facility] showed hemoglobin 13.6, platelets 247, BUN 18, creatinine 0.9, potassium 4.3, AST 19, ALT 18.  Other Studies Reviewed Today:  1. Echocardiogram 09/16/2013: Moderate LVH with LVEF 45-50%, inferior and inferolateral hypokinesis, grade 1 diastolic dysfunction, mild to moderate left atrial enlargement, minor MAC with mild mitral regurgitation, mildly dilated aortic root, trivial tricuspid regurgitation, unable to assess PASP.   2. Cardiac catheterization 10/30/2013: Hemodynamic Findings: Central aortic pressure: 138/63 Left ventricular pressure: 136/10/17  Angiographic Findings:  Left main: 10% distal stenosis.   Left Anterior Descending Artery: Large caliber vessel that gives off a bifurcating moderate caliber septal perforating branch and then continuation to the apex with one moderate caliber diagonal branch. The proximal LAD has mild plaque. The mid LAD has diffuse 20% stenosis. The distal vessel has no obstructive disease. The diagonal branch is moderate in caliber with mild plaque.   Circumflex Artery: Moderate caliber vessel with 20% ostial stenosis, moderate caliber obtuse marginal branch with mild plaque. The intermediate branch is large in caliber with mild diffuse plaque.   Right Coronary Artery: Large dominant vessel. The  proximal vessel has mild luminal irregularities. The mid stented segment is patent with mild, 20% diffuse stent restenosis. The distal vessel, PDA and PLA have mild luminal irregularities.   Left Ventricular Angiogram: Deferred.   Impression: 1. Single vessel CAD with patent stent mid RCA, mild restenosis in the stented segment.  2. Mild non-obstructive disease in the LAD and Circumflex   Assessment and Plan:  1. Symptomatically stable with CAD status post BMS to the RCA in 2014, coronary anatomy stable by recent follow-up cardiac catheterization. Continue medical therapy and observation.  2. Essential hypertension, blood pressure is normal today.  3. Paroxysmal atrial fibrillation, symptomatically well controlled. She continues on beta blocker and Xarelto. Recent lab work reviewed.  Current medicines are re is viewed at length with the patient today.  The patient does not have concerns regarding medicines.  Disposition: FU with me in 4 months.   Signed, Satira Sark, MD, [facility] 03/22/2014 11:29 AM    [facility] [place], [place], [place] [address] Phone: [phone]; Fax: [phone]

## 2014-03-22 NOTE — Patient Instructions (Signed)
Your physician recommends that you schedule a follow-up appointment in: 4 months. You will receive a reminder letter in the mail in about 2 months reminding you to call and schedule your appointment. If you don't receive this letter, please contact our office. Your physician recommends that you continue on your current medications as directed. Please refer to the Current Medication list given to you today.

## 2014-03-24 ENCOUNTER — Telehealth (INDEPENDENT_AMBULATORY_CARE_PROVIDER_SITE_OTHER): Payer: Self-pay | Admitting: *Deleted

## 2014-03-24 NOTE — Telephone Encounter (Signed)
Patient's Insurance will no longer cover Delzicol. Their options are as follows ; Balsalazide 750 mg , Sulfasalazide. Brand - patient will pay 33% Tier 5 - Dipentium , Apriso Tier 4 Lialda (cost to patient is $70-$80 per month)  Per Dr.Rehman - can switch to Balsalazide 750 mg - Take 3 capsules by mouth three times daily -1 month -5 refills. This was called to [facility].

## 2014-04-01 ENCOUNTER — Other Ambulatory Visit: Payer: Self-pay | Admitting: Cardiology

## 2014-05-03 ENCOUNTER — Ambulatory Visit (INDEPENDENT_AMBULATORY_CARE_PROVIDER_SITE_OTHER): Payer: Medicare Other | Admitting: Internal Medicine

## 2014-05-03 ENCOUNTER — Encounter (INDEPENDENT_AMBULATORY_CARE_PROVIDER_SITE_OTHER): Payer: Self-pay | Admitting: Internal Medicine

## 2014-05-03 VITALS — BP 134/58 | HR 62 | Temp 98.3°F | Resp 18 | Ht 64.0 in | Wt 232.3 lb

## 2014-05-03 DIAGNOSIS — K5190 Ulcerative colitis, unspecified, without complications: Secondary | ICD-10-CM | POA: Insufficient documentation

## 2014-05-03 NOTE — Patient Instructions (Signed)
Hemoccult 2.

## 2014-05-03 NOTE — Progress Notes (Signed)
Presenting complaint;  Follow-up for ulcerative colitis.  Subjective:  Patient is 81 year old Caucasian female who has history of ulcerative colitis and is here for yearly visit. She has no GI complaints. She says her bowels are moving better since she was switched to balsalazide. Previously she was prone to be constipated and now she is having normal stool every day. She denies melena or rectal bleeding. She had single episode of abdominal pain with urgency 2 weeks ago. She did not experience nausea vomiting fever or chills. She states her appetite is not good but she is not lost any weight. She states she is not able to walk and exercise much on account of her knee problems.   Current Medications: Outpatient Encounter Prescriptions as of 05/03/2014  Medication Sig  . acetaminophen (TYLENOL) 500 MG tablet Take 1,000 mg by mouth every 8 (eight) hours as needed for mild pain or headache.   Marland Kitchen aspirin 81 MG tablet Take 81 mg by mouth daily.  Marland Kitchen atorvastatin (LIPITOR) 20 MG tablet Take 20 mg by mouth daily.  . balsalazide (COLAZAL) 750 MG capsule Take 750 mg by mouth 3 (three) times daily.   Marland Kitchen ketotifen (THERA TEARS ALLERGY) 0.025 % ophthalmic solution Place 2 drops into both eyes as needed (for dry eyes).  Marland Kitchen losartan (COZAAR) 25 MG tablet TAKE 1 TABLET BY MOUTH EVERY DAY  . metoprolol succinate (TOPROL-XL) 25 MG 24 hr tablet TAKE 1 AND 1/2 TABLETS BY MOUTH DAILY.  . nitroGLYCERIN (NITROSTAT) 0.3 MG SL tablet Place 0.3 mg under the tongue every 5 (five) minutes as needed for chest pain.  . potassium chloride SA (K-DUR,KLOR-CON) 20 MEQ tablet Take 0.5 tablets (10 mEq total) by mouth daily.  Marland Kitchen triamterene-hydrochlorothiazide (MAXZIDE-25) 37.5-25 MG per tablet Take 1 tablet by mouth daily.  Alveda Reasons 15 MG TABS tablet TAKE 1 TABLET BY MOUTH EVERY DAY  . [DISCONTINUED] losartan (COZAAR) 25 MG tablet Take 25 mg by mouth daily.  . [DISCONTINUED] Mesalamine (DELZICOL) 400 MG CPDR DR capsule Take 800 mg  by mouth 2 (two) times daily.  . [DISCONTINUED] Rivaroxaban (XARELTO) 15 MG TABS tablet Take 15 mg by mouth daily.     Objective: Blood pressure 134/58, pulse 62, temperature 98.3 F (36.8 C), temperature source Oral, resp. rate 18, height 5' 4"  (1.626 m), weight 232 lb 4.8 oz (105.371 kg). Patient is alert and in no acute distress. Conjunctiva is pink. Sclera is nonicteric Oropharyngeal mucosa is normal. No neck masses or thyromegaly noted. Cardiac exam with irregular rhythm normal S1 and S2. No murmur or gallop noted. Lungs are clear to auscultation. Abdomen is full but soft and nontender without organomegaly or masses.  No LE edema or clubbing noted.  Labs/studies Results: Lab data from 03/10/2014(Dr. Howard's office). WBC 9.5, H&H 13.8 and 40.2 and platelet count 347K  Glucose 81, BUN 18 and creatinine 0.96  Bilirubin 0.9, AP 65, AST 19, ALT 18 and albumin 4.3.    Assessment:  #1. Ulcerative colitis appears to be in remission. It is very reassuring to note that she has not experienced any bleeding while on anticoagulant for paroxysmal atrial fibrillation. She had extended flexible sigmoidoscopy back in January 2007 when her disease was diagnosed. She should undergo colonoscopy when she is able to come off anticoagulant for 2 days.  Plan:  Hemoccult 1 now and at a later date if she has dark stool. Colonoscopy this year if she is able to come off Xarelto for two days. Office visit in 1 year.

## 2014-06-30 ENCOUNTER — Other Ambulatory Visit: Payer: Self-pay | Admitting: Cardiology

## 2014-07-05 ENCOUNTER — Other Ambulatory Visit: Payer: Self-pay

## 2014-07-30 ENCOUNTER — Other Ambulatory Visit: Payer: Self-pay | Admitting: Cardiology

## 2014-08-11 ENCOUNTER — Ambulatory Visit (INDEPENDENT_AMBULATORY_CARE_PROVIDER_SITE_OTHER): Payer: Medicare Other | Admitting: Cardiology

## 2014-08-11 ENCOUNTER — Encounter: Payer: Self-pay | Admitting: Cardiology

## 2014-08-11 VITALS — BP 104/64 | HR 52 | Ht 64.0 in | Wt 219.0 lb

## 2014-08-11 DIAGNOSIS — I2510 Atherosclerotic heart disease of native coronary artery without angina pectoris: Secondary | ICD-10-CM | POA: Diagnosis not present

## 2014-08-11 DIAGNOSIS — I429 Cardiomyopathy, unspecified: Secondary | ICD-10-CM

## 2014-08-11 DIAGNOSIS — I10 Essential (primary) hypertension: Secondary | ICD-10-CM

## 2014-08-11 DIAGNOSIS — I480 Paroxysmal atrial fibrillation: Secondary | ICD-10-CM | POA: Diagnosis not present

## 2014-08-11 MED ORDER — TRIAMTERENE-HCTZ 37.5-25 MG PO TABS: 0.5000 | ORAL_TABLET | Freq: Every day | ORAL | Status: DC

## 2014-08-11 MED ORDER — NITROGLYCERIN 0.3 MG SL SUBL: 0.3000 mg | SUBLINGUAL_TABLET | SUBLINGUAL | Status: DC | PRN

## 2014-08-11 NOTE — Progress Notes (Signed)
Cardiology Office Note  Date: 08/11/2014   ID: Theodosia, Bahena 02/27/1930, MRN [id number]  PCP: Rory Percy, MD  Primary Cardiologist: Rozann Lesches, MD   Chief Complaint  Patient presents with  . Coronary Artery Disease  . Cardiomyopathy  . Atrial arrhythmias    History of Present Illness: [name] is an 81 y.o. female last seen in March. She presents for a routine follow-up visit. Since our last encounter, she tells methat she has been doing fairly well. She is in Weight Watchers, has lost 15 pounds. Energy level is better. Leg edema is also better. She has noticed that her blood pressure has come down further. She does not endorse any angina. Transient episode of palpitations noted recently, but otherwise no prolonged events.  I reviewed her most recent cardiac testing, outlined below.  She continues to follow with Dr. Nadara Mustard. Does have some limitations related to arthritis in her hands and feet. Also complains of having cold hands at times. She has had no bleeding problems on Xarelto. Lab work from March is outlined below.  She states that her husband has been doing better healthwise. There has been less stress overall.   Past Medical History  Diagnosis Date  . Atrial flutter   . Depression   . Essential hypertension, benign   . Arthritis   . Ulcerative colitis   . Paroxysmal atrial fibrillation   . Coronary atherosclerosis of native coronary artery     BMS RCA May 2014 - [place]  . Secondary cardiomyopathy     LVEF 35% - improved to 45-50%    Past Surgical History  Procedure Laterality Date  . Total knee arthroplasty    . Appendectomy    . Tonsillectomy    . Abdominal hysterectomy    . Bladder surgery    . Bilateral knee replacements       2007, 2008  . Yag laser application Bilateral 99/03/5699    Procedure: YAG LASER APPLICATION;  Surgeon: Williams Che, MD;  Location: [facility];  Service: Ophthalmology;  Laterality: Bilateral;  . Left  heart catheterization with coronary angiogram N/A 10/30/2013    Procedure: LEFT HEART CATHETERIZATION WITH CORONARY ANGIOGRAM;  Surgeon: Burnell Blanks, MD;  Location: [facility] CATH LAB;  Service: Cardiovascular;  Laterality: N/A;    Current Outpatient Prescriptions  Medication Sig Dispense Refill  . acetaminophen (TYLENOL) 500 MG tablet Take 1,000 mg by mouth every 8 (eight) hours as needed for mild pain or headache.     Marland Kitchen aspirin 81 MG tablet Take 81 mg by mouth daily.    Marland Kitchen atorvastatin (LIPITOR) 20 MG tablet Take 20 mg by mouth daily.    . balsalazide (COLAZAL) 750 MG capsule Take 750 mg by mouth 3 (three) times daily.   4  . ketotifen (THERA TEARS ALLERGY) 0.025 % ophthalmic solution Place 2 drops into both eyes as needed (for dry eyes).    Marland Kitchen losartan (COZAAR) 25 MG tablet TAKE 1 TABLET BY MOUTH EVERY DAY 30 tablet 3  . metoprolol succinate (TOPROL-XL) 25 MG 24 hr tablet TAKE 1 AND 1/2 TABLETS BY MOUTH DAILY. 45 tablet 1  . nitroGLYCERIN (NITROSTAT) 0.3 MG SL tablet Place 1 tablet (0.3 mg total) under the tongue every 5 (five) minutes x 3 doses as needed for chest pain. 25 tablet 3  . potassium chloride SA (K-DUR,KLOR-CON) 20 MEQ tablet Take 0.5 tablets (10 mEq total) by mouth daily. 45 tablet 3  . triamterene-hydrochlorothiazide (MAXZIDE-25) 37.5-25 MG per  tablet TAKE 1 TABLET BY MOUTH DAILY. 30 tablet 6  . XARELTO 15 MG TABS tablet TAKE 1 TABLET BY MOUTH EVERY DAY 30 tablet 6   No current facility-administered medications for this visit.    Allergies:  Sinus & allergy; Demerol; and Penicillins   Social History: The patient  reports that she has never smoked. She has never used smokeless tobacco. She reports that she does not drink alcohol or use illicit drugs.   ROS:  Please see the history of present illness. Otherwise, complete review of systems is positive for none.  All other systems are reviewed and negative.   Physical Exam: VS:  BP 104/64 mmHg  Pulse 52  Ht 5' 4"  (1.626  m)  Wt 219 lb (99.338 kg)  BMI 37.57 kg/m2  SpO2 94%, BMI Body mass index is 37.57 kg/(m^2).  Wt Readings from Last 3 Encounters:  08/11/14 219 lb (99.338 kg)  05/03/14 232 lb 4.8 oz (105.371 kg)  03/22/14 228 lb 12.8 oz (103.783 kg)    Appears comfortable at rest. Overweight woman in no acute distress.  HEENT: Conjunctiva and lids normal, oropharynx with moist mucosa.  Neck: Supple, no elevated JVP or carotid bruits.  Lungs: Clear to auscultation, nonlabored.  Cardiac: Irregularly irregular, no S3 or rub.  Abdomen: Soft, nontender, bowel sounds present.  Extremities: No pitting edema and possibly mild lymphedema.  Skin: Warm and dry. Musculoskeletal: No kyphosis. Neuropsychiatric: Alert and oriented x3, affect appropriate.   ECG: ECG is not ordered today.   Recent Labwork:  03/11/2014: Hemoglobin 13.8, platelets 347, BUN 18, creatinine 0.9, potassium 4.3, AST 19, ALT 18.  Other Studies Reviewed Today:  1. Echocardiogram 09/16/2013: Moderate LVH with LVEF 45-50%, inferior and inferolateral hypokinesis, grade 1 diastolic dysfunction, mild to moderate left atrial enlargement, minor MAC with mild mitral regurgitation, mildly dilated aortic root, trivial tricuspid regurgitation, unable to assess PASP.   2. Cardiac catheterization 10/30/2013: Hemodynamic Findings: Central aortic pressure: 138/63 Left ventricular pressure: 136/10/17  Angiographic Findings:  Left main: 10% distal stenosis.   Left Anterior Descending Artery: Large caliber vessel that gives off a bifurcating moderate caliber septal perforating branch and then continuation to the apex with one moderate caliber diagonal branch. The proximal LAD has mild plaque. The mid LAD has diffuse 20% stenosis. The distal vessel has no obstructive disease. The diagonal branch is moderate in caliber with mild plaque.   Circumflex Artery: Moderate caliber vessel with 20% ostial stenosis, moderate caliber obtuse marginal  branch with mild plaque. The intermediate branch is large in caliber with mild diffuse plaque.   Right Coronary Artery: Large dominant vessel. The proximal vessel has mild luminal irregularities. The mid stented segment is patent with mild, 20% diffuse stent restenosis. The distal vessel, PDA and PLA have mild luminal irregularities.   Left Ventricular Angiogram: Deferred.   Impression: 1. Single vessel CAD with patent stent mid RCA, mild restenosis in the stented segment.  2. Mild non-obstructive disease in the LAD and Circumflex.   Assessment and Plan:  1. Paroxysmal atrial fibrillation, symptomatically well controlled. Plan to continue current medications including Toprol-XL and Xarelto.  2. CAD status post BMS to the RCA in May 2014, only mild in-stent restenosis by cardiac catheterization in October 2015. Continue medical therapy and observation.  3. History of cardiomyopathy with improvement in LVEF up to the range of 45-50%.  4. Essential hypertension. For now we will cut Maxzide dose in half, continue to follow.  Current medicines were reviewed with the patient  today.  Disposition: FU with me in 4 months.   Signed, Satira Sark, MD, [facility] 08/11/2014 9:37 AM    [facility] [place], [place], [place] [address] Phone: [phone]; Fax: [phone]

## 2014-08-11 NOTE — Patient Instructions (Addendum)
Your physician has recommended you make the following change in your medication:  Decrease your triamterene 37.5-25 mg  to 1/2 tablet daily. Continue all other medications the same. Your physician recommends that you schedule a follow-up appointment in: 4 months. You will receive a reminder letter in the mail in about 2 months reminding you to call and schedule your appointment. If you don't receive this letter, please contact our office.

## 2014-08-30 ENCOUNTER — Other Ambulatory Visit: Payer: Self-pay | Admitting: Cardiology

## 2014-08-30 ENCOUNTER — Other Ambulatory Visit (INDEPENDENT_AMBULATORY_CARE_PROVIDER_SITE_OTHER): Payer: Self-pay | Admitting: Internal Medicine

## 2014-10-28 ENCOUNTER — Other Ambulatory Visit: Payer: Self-pay | Admitting: Cardiology

## 2014-11-29 ENCOUNTER — Other Ambulatory Visit: Payer: Self-pay | Admitting: Cardiology

## 2015-01-06 ENCOUNTER — Encounter: Payer: Self-pay | Admitting: Cardiology

## 2015-01-06 ENCOUNTER — Encounter: Payer: Self-pay | Admitting: *Deleted

## 2015-01-06 ENCOUNTER — Ambulatory Visit (INDEPENDENT_AMBULATORY_CARE_PROVIDER_SITE_OTHER): Payer: Medicare Other | Admitting: Cardiology

## 2015-01-06 VITALS — BP 100/60 | HR 57 | Ht 63.0 in | Wt 202.0 lb

## 2015-01-06 DIAGNOSIS — I480 Paroxysmal atrial fibrillation: Secondary | ICD-10-CM

## 2015-01-06 DIAGNOSIS — I429 Cardiomyopathy, unspecified: Secondary | ICD-10-CM | POA: Diagnosis not present

## 2015-01-06 DIAGNOSIS — I2510 Atherosclerotic heart disease of native coronary artery without angina pectoris: Secondary | ICD-10-CM | POA: Diagnosis not present

## 2015-01-06 DIAGNOSIS — I10 Essential (primary) hypertension: Secondary | ICD-10-CM

## 2015-01-06 NOTE — Patient Instructions (Signed)
Continue all current medications. Your physician wants you to follow up in: 6 months.  You will receive a reminder letter in the mail one-two months in advance.  If you don't receive a letter, please call our office to schedule the follow up appointment   

## 2015-01-06 NOTE — Progress Notes (Signed)
Cardiology Office Note  Date: 01/06/2015   ID: [name], DOB 1930/10/19, MRN [id number]  PCP: Rory Percy, MD  Primary Cardiologist: Rozann Lesches, MD   Chief Complaint  Patient presents with  . Coronary Artery Disease  . PAF    History of Present Illness: [name] is an 81 y.o. female last seen in August. She is here today with her husband for a follow-up visit. She does not report any chest pain with exertion or significant palpitations. She has NYHA class II dyspnea with typical activities. Weight is down 17 pounds compared to August. She has done very well in Weight Watchers, and continues in the program with plan to lose more weight. She has more energy.  We reviewed her medications which are outlined below. At the last visit we reduced her dose of Maxzide. She continues on Xarelto without any spontaneous bleeding problems. She did have a fall at church recently, tripped without syncope. She got a bruise on her leg but no other major injuries.  She just recently had follow-up lab work with Dr. Nadara Mustard at physical.results are being requested.  She states that her blood pressure remains very well controlled. I did talk with her about considering simplifying her medications by stopping Maxide and potassium supplements. She does have problems with intermittent leg edema however, and it is not entirely clear whether this would be more of a problem off of the low-dose diuretic or not. For now she decided to keep things stable.  ECG today shows sinus rhythm with PACs, IVCD, and nonspecific T-wave changes.  Past Medical History  Diagnosis Date  . Atrial flutter ([place])   . Depression   . Essential hypertension, benign   . Arthritis   . Ulcerative colitis   . Paroxysmal atrial fibrillation ([facility])   . Coronary atherosclerosis of native coronary artery     BMS RCA May 2014 - [place]  . Secondary cardiomyopathy ([facility])     LVEF 35% - improved to 45-50%    Past  Surgical History  Procedure Laterality Date  . Total knee arthroplasty    . Appendectomy    . Tonsillectomy    . Abdominal hysterectomy    . Bladder surgery    . Bilateral knee replacements       2007, 2008  . Yag laser application Bilateral 81/01/5724    Procedure: YAG LASER APPLICATION;  Surgeon: Williams Che, MD;  Location: [facility];  Service: Ophthalmology;  Laterality: Bilateral;  . Left heart catheterization with coronary angiogram N/A 10/30/2013    Procedure: LEFT HEART CATHETERIZATION WITH CORONARY ANGIOGRAM;  Surgeon: Burnell Blanks, MD;  Location: [facility] CATH LAB;  Service: Cardiovascular;  Laterality: N/A;    Current Outpatient Prescriptions  Medication Sig Dispense Refill  . acetaminophen (TYLENOL) 500 MG tablet Take 1,000 mg by mouth every 8 (eight) hours as needed for mild pain or headache.     Marland Kitchen aspirin 81 MG tablet Take 81 mg by mouth daily.    Marland Kitchen atorvastatin (LIPITOR) 20 MG tablet Take 20 mg by mouth daily.    . balsalazide (COLAZAL) 750 MG capsule TAKE 3 CAPSULES BY MOUTH THREE TIMES DAILY (REPLACING DELZICOL PER INS) 03/24/14 SK 270 capsule 5  . ketotifen (THERA TEARS ALLERGY) 0.025 % ophthalmic solution Place 2 drops into both eyes as needed (for dry eyes).    Marland Kitchen losartan (COZAAR) 25 MG tablet TAKE 1 TABLET BY MOUTH EVERY DAY 90 tablet 3  . metoprolol succinate (TOPROL-XL) 25  MG 24 hr tablet TAKE 1 AND 1/2 TABLETS BY MOUTH DAILY. 45 tablet 5  . nitroGLYCERIN (NITROSTAT) 0.3 MG SL tablet Place 1 tablet (0.3 mg total) under the tongue every 5 (five) minutes x 3 doses as needed for chest pain. 25 tablet 3  . potassium chloride SA (K-DUR,KLOR-CON) 20 MEQ tablet TAKE 1/2 TABLET BY MOUTH DAILY. 45 tablet 3  . triamterene-hydrochlorothiazide (MAXZIDE-25) 37.5-25 MG per tablet Take 0.5 tablets by mouth daily. 15 tablet 6  . XARELTO 15 MG TABS tablet TAKE 1 TABLET BY MOUTH EVERY DAY 30 tablet 5   No current facility-administered medications for this visit.   Allergies:   Sinus & allergy; Demerol; and Penicillins   Social History: The patient  reports that she has never smoked. She has never used smokeless tobacco. She reports that she does not drink alcohol or use illicit drugs.   ROS:  Please see the history of present illness. Otherwise, complete review of systems is positive for arthritic pains.  All other systems are reviewed and negative.   Physical Exam: VS:  BP 100/60 mmHg  Pulse 57  Ht 5' 3"  (1.6 m)  Wt 202 lb (91.627 kg)  BMI 35.79 kg/m2  SpO2 97%, BMI Body mass index is 35.79 kg/(m^2).  Wt Readings from Last 3 Encounters:  01/06/15 202 lb (91.627 kg)  08/11/14 219 lb (99.338 kg)  05/03/14 232 lb 4.8 oz (105.371 kg)    Appears comfortable at rest. Overweight woman in no acute distress.  HEENT: Conjunctiva and lids normal, oropharynx with moist mucosa.  Neck: Supple, no elevated JVP or carotid bruits.  Lungs: Clear to auscultation, nonlabored.  Cardiac: RRR with ectopy, no S3 or rub.  Abdomen: Soft, nontender, bowel sounds present.  Extremities: No pitting edema and possibly mild lymphedema.  Skin: Warm and dry. Musculoskeletal: No kyphosis. Neuropsychiatric: Alert and oriented x3, affect appropriate.  ECG: ECG is ordered today.  Recent Labwork:  March 2016: hemoglobin 13.6, platelets 347 , BUN 18, creatinine 0.9, potassium 4.3, AST 19 , ALT 16  Other Studies Reviewed Today:  Echocardiogram 09/16/2013: Moderate LVH with LVEF 45-50%, inferior and inferolateral hypokinesis, grade 1 diastolic dysfunction, mild to moderate left atrial enlargement, minor MAC with mild mitral regurgitation, mildly dilated aortic root, trivial tricuspid regurgitation, unable to assess PASP.   Cardiac catheterization 10/30/2013: Hemodynamic Findings: Central aortic pressure: 138/63 Left ventricular pressure: 136/10/17  Angiographic Findings:  Left main: 10% distal stenosis.   Left Anterior Descending Artery: Large caliber vessel that gives  off a bifurcating moderate caliber septal perforating branch and then continuation to the apex with one moderate caliber diagonal branch. The proximal LAD has mild plaque. The mid LAD has diffuse 20% stenosis. The distal vessel has no obstructive disease. The diagonal branch is moderate in caliber with mild plaque.   Circumflex Artery: Moderate caliber vessel with 20% ostial stenosis, moderate caliber obtuse marginal branch with mild plaque. The intermediate branch is large in caliber with mild diffuse plaque.   Right Coronary Artery: Large dominant vessel. The proximal vessel has mild luminal irregularities. The mid stented segment is patent with mild, 20% diffuse stent restenosis. The distal vessel, PDA and PLA have mild luminal irregularities.   Assessment and Plan:  1. Paroxysmal atrial fibrillation, maintaining sinus rhythm. No significant progression in palpitations. She continues on Toprol-XL and Xarelto. Lab work done recently by Dr. Nadara Mustard is being requested for review. She does not report any spontaneous bleeding problems.  2. Symptomatically stable CAD. Most recent cardiac catheterization  from October 2015 showed no significant obstructive disease with patent stent site within the RCA and only 20% in-stent restenosis.continue medical therapy and observation.  3. Essential hypertension, blood pressure normal today. No changes made to current regimen. As noted above could consider trying to simplify her medications by stopping Maxide and KCL, but this has also helped intermittent leg edema. Keep follow-up with Dr. Nadara Mustard.  4. History of cardiomyopathy with improvement in LVEF, most recently 45-50% range. No active heart failure symptoms.  5. Intentional weight loss through diet. She continues to feel better, follows in the Weight Watcher's program.  Current medicines were reviewed with the patient today.   Orders Placed This Encounter  Procedures  . EKG 12-Lead    Disposition:  FU with me in 6 months.   Signed, Satira Sark, MD, [facility] 01/06/2015 8:41 AM    [facility] [place], [place], [place] [address] Phone: [phone]; Fax: [phone]

## 2015-01-21 ENCOUNTER — Encounter (INDEPENDENT_AMBULATORY_CARE_PROVIDER_SITE_OTHER): Payer: Self-pay | Admitting: Internal Medicine

## 2015-01-26 ENCOUNTER — Other Ambulatory Visit: Payer: Self-pay | Admitting: Cardiology

## 2015-02-25 ENCOUNTER — Other Ambulatory Visit (INDEPENDENT_AMBULATORY_CARE_PROVIDER_SITE_OTHER): Payer: Self-pay | Admitting: Internal Medicine

## 2015-03-28 ENCOUNTER — Other Ambulatory Visit: Payer: Self-pay | Admitting: Cardiology

## 2015-04-26 ENCOUNTER — Other Ambulatory Visit: Payer: Self-pay | Admitting: Cardiology

## 2015-05-03 ENCOUNTER — Ambulatory Visit (INDEPENDENT_AMBULATORY_CARE_PROVIDER_SITE_OTHER): Payer: Medicare Other | Admitting: Internal Medicine

## 2015-05-03 ENCOUNTER — Encounter (INDEPENDENT_AMBULATORY_CARE_PROVIDER_SITE_OTHER): Payer: Self-pay | Admitting: Internal Medicine

## 2015-05-03 ENCOUNTER — Encounter (INDEPENDENT_AMBULATORY_CARE_PROVIDER_SITE_OTHER): Payer: Self-pay | Admitting: *Deleted

## 2015-05-03 ENCOUNTER — Other Ambulatory Visit (INDEPENDENT_AMBULATORY_CARE_PROVIDER_SITE_OTHER): Payer: Self-pay | Admitting: Internal Medicine

## 2015-05-03 ENCOUNTER — Other Ambulatory Visit (INDEPENDENT_AMBULATORY_CARE_PROVIDER_SITE_OTHER): Payer: Self-pay | Admitting: *Deleted

## 2015-05-03 ENCOUNTER — Telehealth (INDEPENDENT_AMBULATORY_CARE_PROVIDER_SITE_OTHER): Payer: Self-pay | Admitting: *Deleted

## 2015-05-03 VITALS — BP 110/70 | HR 64 | Temp 98.4°F | Ht 63.0 in | Wt 192.0 lb

## 2015-05-03 DIAGNOSIS — K5120 Ulcerative (chronic) proctitis without complications: Secondary | ICD-10-CM

## 2015-05-03 MED ORDER — PEG 3350-KCL-NA BICARB-NACL 420 G PO SOLR: 4000.0000 mL | Freq: Once | ORAL | Status: DC

## 2015-05-03 NOTE — Telephone Encounter (Signed)
Fwd to Dr. Domenic Polite.

## 2015-05-03 NOTE — Telephone Encounter (Signed)
Patient is scheduled for colonoscopy 06/15/15 and needs to stop Xarelto 2 days and ASA 2 days prior -- please advise if this is ok, thanks

## 2015-05-03 NOTE — Telephone Encounter (Signed)
Noted. Reasonable to hold Xarelto and aspirin as requested prior to colonoscopy.

## 2015-05-03 NOTE — Telephone Encounter (Signed)
Patient aware.

## 2015-05-03 NOTE — Telephone Encounter (Signed)
Patient needs trilyte 

## 2015-05-03 NOTE — Patient Instructions (Addendum)
Continue present medications. OV in one year. Colonoscopy. The risks and benefits such as perforation, bleeding, and infection were reviewed with the patient and is agreeable.

## 2015-05-03 NOTE — Progress Notes (Signed)
Subjective:    Patient ID: [name], female    DOB: 03/16/1930, 82 y.o.   MRN: [id number]  HPI Here today for f/u. Hx of UC.  She was last seen b y Dr Laural Golden in April of 2016.       She is maintained on Balsalazide 718m TID for her UC.  Diagnosed in 2007.  Flexible sigmoid in 2007 revealed diffuse colitis up to mid transverse colon.  Endoscopic and bx consistent with UC. Her last colonoscopy was in 2010 and was normal except for single anal papilla and external hemorrhoids. UC remains in remission. She tells me she is doing good. She is going to [employer]. She has lost 42 pounds. Appetite is good. She is having a BM about once a day or every other day.  She does have bouts of constipation at time and takes a suppository as needed.  Hx of atrial fib and maintained on Xarelto and ASA.   Review of Systems Past Medical History  Diagnosis Date  . [place]   . Depression   . Essential hypertension, benign   . Arthritis   . Ulcerative colitis   . Paroxysmal atrial fibrillation (HCC)   . Coronary atherosclerosis of native coronary artery     BMS RCA May 2014 - [place]  . Secondary cardiomyopathy (HCC)     LVEF 35% - improved to 45-50%    Past Surgical History  Procedure Laterality Date  . Total knee arthroplasty    . Appendectomy    . Tonsillectomy    . Abdominal hysterectomy    . Bladder surgery    . Bilateral knee replacements       2007, 2008  . Yag laser application Bilateral 102/05/4268   Procedure: YAG LASER APPLICATION;  Surgeon: CWilliams Che MD;  Location: [facility];  Service: Ophthalmology;  Laterality: Bilateral;  . Left heart catheterization with coronary angiogram N/A 10/30/2013    Procedure: LEFT HEART CATHETERIZATION WITH CORONARY ANGIOGRAM;  Surgeon: CBurnell Blanks MD;  [facility];  Service: Cardiovascular;  Laterality: N/A;    Allergies  Allergen Reactions  . Sinus & Allergy [Chlorpheniramine-Phenylephrine]  Shortness Of Breath  . Demerol Rash  . Penicillins Rash and Other (See Comments)    REACTION: rash, years ago    Current Outpatient Prescriptions on File Prior to Visit  Medication Sig Dispense Refill  . acetaminophen (TYLENOL) 500 MG tablet Take 1,000 mg by mouth every 8 (eight) hours as needed for mild pain or headache.     .Marland Kitchenaspirin 81 MG tablet Take 81 mg by mouth daily.    .Marland Kitchenatorvastatin (LIPITOR) 20 MG tablet TAKE 1 TABLET BY MOUTH AT BEDTIME 30 tablet 6  . balsalazide (COLAZAL) 750 MG capsule TAKE 3 CAPSULES BY MOUTH THREE TIMES DAILY (REPLACING DELZICOL) 270 capsule 5  . ketotifen (THERA TEARS ALLERGY) 0.025 % ophthalmic solution Place 2 drops into both eyes as needed (for dry eyes).    .Marland Kitchenlosartan (COZAAR) 25 MG tablet TAKE 1 TABLET BY MOUTH EVERY DAY 90 tablet 3  . metoprolol succinate (TOPROL-XL) 25 MG 24 hr tablet TAKE 1 & 1/2 TABLETS BY MOUTH EVERY DAY 45 tablet 5  . nitroGLYCERIN (NITROSTAT) 0.3 MG SL tablet Place 1 tablet (0.3 mg total) under the tongue every 5 (five) minutes x 3 doses as needed for chest pain. 25 tablet 3  . potassium chloride SA (K-DUR,KLOR-CON) 20 MEQ tablet TAKE 1/2 TABLET BY MOUTH DAILY. 45 tablet  3  . triamterene-hydrochlorothiazide (MAXZIDE-25) 37.5-25 MG tablet TAKE 1/2 TABLET BY MOUTH DAILY 15 tablet 6  . XARELTO 15 MG TABS tablet TAKE 1 TABLET BY MOUTH EVERY DAY 30 tablet 5   No current facility-administered medications on file prior to visit.        Objective:   Physical Exam Blood pressure 110/70, pulse 64, temperature 98.4 F (36.9 C), height 5' 3"  (1.6 m), weight 192 lb (87.091 kg). Alert and oriented. Skin warm and dry. Oral mucosa is moist.   . Sclera anicteric, conjunctivae is pink. Thyroid not enlarged. No cervical lymphadenopathy. Lungs clear. Heart regular rate and rhythm.  Abdomen is soft. Bowel sounds are positive. No hepatomegaly. No abdominal masses felt. No tenderness.  No edema to lower extremities.          Assessment & Plan:   UC. She seems to be in remission. She is doing good. OV in 1 year. She is due for a colonoscopy for surveillance.

## 2015-05-19 DIAGNOSIS — I10 Essential (primary) hypertension: Secondary | ICD-10-CM | POA: Diagnosis not present

## 2015-05-19 DIAGNOSIS — Z6834 Body mass index (BMI) 34.0-34.9, adult: Secondary | ICD-10-CM | POA: Diagnosis not present

## 2015-05-19 DIAGNOSIS — I482 Chronic atrial fibrillation: Secondary | ICD-10-CM | POA: Diagnosis not present

## 2015-06-01 DIAGNOSIS — H43393 Other vitreous opacities, bilateral: Secondary | ICD-10-CM | POA: Diagnosis not present

## 2015-06-15 ENCOUNTER — Encounter (HOSPITAL_COMMUNITY): Payer: Self-pay | Admitting: *Deleted

## 2015-06-15 ENCOUNTER — Ambulatory Visit (HOSPITAL_COMMUNITY)
Admission: RE | Admit: 2015-06-15 | Discharge: 2015-06-15 | Disposition: A | Discharge status: Home Health | Payer: Medicare Other | Source: Ambulatory Visit | Attending: Internal Medicine | Admitting: Internal Medicine

## 2015-06-15 ENCOUNTER — Encounter (HOSPITAL_COMMUNITY)
Admission: RE | Disposition: A | Discharge status: Home Health | Payer: Self-pay | Source: Ambulatory Visit | Attending: Internal Medicine

## 2015-06-15 DIAGNOSIS — M1990 Unspecified osteoarthritis, unspecified site: Secondary | ICD-10-CM | POA: Insufficient documentation

## 2015-06-15 DIAGNOSIS — Z880 Allergy status to penicillin: Secondary | ICD-10-CM | POA: Insufficient documentation

## 2015-06-15 DIAGNOSIS — D123 Benign neoplasm of transverse colon: Secondary | ICD-10-CM | POA: Insufficient documentation

## 2015-06-15 DIAGNOSIS — Z7982 Long term (current) use of aspirin: Secondary | ICD-10-CM | POA: Diagnosis not present

## 2015-06-15 DIAGNOSIS — D125 Benign neoplasm of sigmoid colon: Secondary | ICD-10-CM | POA: Diagnosis not present

## 2015-06-15 DIAGNOSIS — I10 Essential (primary) hypertension: Secondary | ICD-10-CM | POA: Insufficient documentation

## 2015-06-15 DIAGNOSIS — I480 Paroxysmal atrial fibrillation: Secondary | ICD-10-CM | POA: Insufficient documentation

## 2015-06-15 DIAGNOSIS — Z7901 Long term (current) use of anticoagulants: Secondary | ICD-10-CM | POA: Diagnosis not present

## 2015-06-15 DIAGNOSIS — K5100 Ulcerative (chronic) pancolitis without complications: Secondary | ICD-10-CM | POA: Insufficient documentation

## 2015-06-15 DIAGNOSIS — F329 Major depressive disorder, single episode, unspecified: Secondary | ICD-10-CM | POA: Diagnosis not present

## 2015-06-15 DIAGNOSIS — K5120 Ulcerative (chronic) proctitis without complications: Secondary | ICD-10-CM

## 2015-06-15 DIAGNOSIS — Z96653 Presence of artificial knee joint, bilateral: Secondary | ICD-10-CM | POA: Diagnosis not present

## 2015-06-15 DIAGNOSIS — D120 Benign neoplasm of cecum: Secondary | ICD-10-CM | POA: Diagnosis not present

## 2015-06-15 DIAGNOSIS — I2510 Atherosclerotic heart disease of native coronary artery without angina pectoris: Secondary | ICD-10-CM | POA: Diagnosis not present

## 2015-06-15 DIAGNOSIS — Z79899 Other long term (current) drug therapy: Secondary | ICD-10-CM | POA: Insufficient documentation

## 2015-06-15 DIAGNOSIS — Z09 Encounter for follow-up examination after completed treatment for conditions other than malignant neoplasm: Secondary | ICD-10-CM | POA: Diagnosis not present

## 2015-06-15 HISTORY — PX: COLONOSCOPY: SHX5424

## 2015-06-15 SURGERY — COLONOSCOPY
Anesthesia: Moderate Sedation

## 2015-06-15 MED ORDER — SODIUM CHLORIDE 0.9 % IV SOLN
INTRAVENOUS | Status: DC
  Administered 2015-06-15: 12:00:00 via INTRAVENOUS

## 2015-06-15 MED ORDER — MEPERIDINE HCL 50 MG/ML IJ SOLN
INTRAMUSCULAR | Status: AC
  Filled 2015-06-15: qty 1

## 2015-06-15 MED ORDER — MIDAZOLAM HCL 5 MG/5ML IJ SOLN
INTRAMUSCULAR | Status: AC
Start: 2015-06-15 — End: 2015-06-16
  Filled 2015-06-15: qty 10

## 2015-06-15 MED ORDER — FENTANYL CITRATE (PF) 100 MCG/2ML IJ SOLN
INTRAMUSCULAR | Status: AC
  Filled 2015-06-15: qty 2

## 2015-06-15 MED ORDER — STERILE WATER FOR IRRIGATION IR SOLN
Status: DC | PRN
  Administered 2015-06-15: 13:00:00

## 2015-06-15 MED ORDER — MIDAZOLAM HCL 5 MG/5ML IJ SOLN
INTRAMUSCULAR | Status: DC | PRN
  Administered 2015-06-15: 2 mg via INTRAVENOUS
  Administered 2015-06-15 (×2): 1 mg via INTRAVENOUS

## 2015-06-15 MED ORDER — FENTANYL CITRATE (PF) 100 MCG/2ML IJ SOLN
INTRAMUSCULAR | Status: DC | PRN
  Administered 2015-06-15 (×2): 25 ug via INTRAVENOUS

## 2015-06-15 NOTE — Discharge Instructions (Signed)
Resume Xarelto on 06/16/2015 and aspirin on 06/17/2015. Resume other medications as before. Resume usual diet. No driving for 24 hours. Physician will call with biopsy results.  Colonoscopy, Care After These instructions give you information on caring for yourself after your procedure. Your doctor may also give you more specific instructions. Call your doctor if you have any problems or questions after your procedure. HOME CARE  Do not drive for 24 hours.  Do not sign important papers or use machinery for 24 hours.  You may shower.  You may go back to your usual activities, but go slower for the first 24 hours.  Take rest breaks often during the first 24 hours.  Walk around or use warm packs on your belly (abdomen) if you have belly cramping or gas.  Drink enough fluids to keep your pee (urine) clear or pale yellow.  Resume your normal diet. Avoid heavy or fried foods.  Avoid drinking alcohol for 24 hours or as told by your doctor.  Only take medicines as told by your doctor. If a tissue sample (biopsy) was taken during the procedure:   Do not take aspirin or blood thinners for 7 days, or as told by your doctor.  Do not drink alcohol for 7 days, or as told by your doctor.  Eat soft foods for the first 24 hours. GET HELP IF: You still have a small amount of blood in your poop (stool) 2-3 days after the procedure. GET HELP RIGHT AWAY IF:  You have more than a small amount of blood in your poop.  You see clumps of tissue (blood clots) in your poop.  Your belly is puffy (swollen).  You feel sick to your stomach (nauseous) or throw up (vomit).  You have a fever.  You have belly pain that gets worse and medicine does not help. MAKE SURE YOU:  Understand these instructions.  Will watch your condition.  Will get help right away if you are not doing well or get worse.   This information is not intended to replace advice given to you by your health care provider. Make  sure you discuss any questions you have with your health care provider.   Document Released: 01/27/2010 Document Revised: 12/30/2012 Document Reviewed: 09/01/2012 Elsevier Interactive Patient Education 2016 Elsevier Inc[employer]   Colon Polyps Polyps are lumps of extra tissue growing inside the body. Polyps can grow in the large intestine (colon). Most colon polyps are noncancerous (benign). However, some colon polyps can become cancerous over time. Polyps that are larger than a pea may be harmful. To be safe, caregivers remove and test all polyps. CAUSES  Polyps form when mutations in the genes cause your cells to grow and divide even though no more tissue is needed. RISK FACTORS There are a number of risk factors that can increase your chances of getting colon polyps. They include:  Being older than 50 years.  Family history of colon polyps or colon cancer.  Long-term colon diseases, such as colitis or Crohn disease.  Being overweight.  Smoking.  Being inactive.  Drinking too much alcohol. SYMPTOMS  Most small polyps do not cause symptoms. If symptoms are present, they may include:  Blood in the stool. The stool may look dark red or black.  Constipation or diarrhea that lasts longer than 1 week. DIAGNOSIS People often do not know they have polyps until their caregiver finds them during a regular checkup. Your caregiver can use 4 tests to check for polyps:  Digital rectal  exam. The caregiver wears gloves and feels inside the rectum. This test would find polyps only in the rectum.  Barium enema. The caregiver puts a liquid called barium into your rectum before taking X-rays of your colon. Barium makes your colon look white. Polyps are dark, so they are easy to see in the X-ray pictures.  Sigmoidoscopy. A thin, flexible tube (sigmoidoscope) is placed into your rectum. The sigmoidoscope has a light and tiny camera in it. The caregiver uses the sigmoidoscope to look at the last third of  your colon.  Colonoscopy. This test is like sigmoidoscopy, but the caregiver looks at the entire colon. This is the most common method for finding and removing polyps. TREATMENT  Any polyps will be removed during a sigmoidoscopy or colonoscopy. The polyps are then tested for cancer. PREVENTION  To help lower your risk of getting more colon polyps:  Eat plenty of fruits and vegetables. Avoid eating fatty foods.  Do not smoke.  Avoid drinking alcohol.  Exercise every day.  Lose weight if recommended by your caregiver.  Eat plenty of calcium and folate. Foods that are rich in calcium include milk, cheese, and broccoli. Foods that are rich in folate include chickpeas, kidney beans, and spinach. HOME CARE INSTRUCTIONS Keep all follow-up appointments as directed by your caregiver. You may need periodic exams to check for polyps. SEEK MEDICAL CARE IF: You notice bleeding during a bowel movement.   This information is not intended to replace advice given to you by your health care provider. Make sure you discuss any questions you have with your health care provider.   Document Released: 09/21/2003 Document Revised: 01/15/2014 Document Reviewed: 03/06/2011 Elsevier Interactive Patient Education [employer].

## 2015-06-15 NOTE — H&P (Signed)
[name] is an 82 y.o. female.   Chief Complaint: Patient is here for colonoscopy. HPI: Patient is 82 year old Caucasian female was 10 years history of ulcerative colitis and is here for surveillance colonoscopy. She is doing well with oral mesalamine. She denies diarrhea and rectal bleeding abdominal pain. Family history is negative for CRC.  Past Medical History  Diagnosis Date  . Atrial flutter ([place])   . Depression   . Essential hypertension, benign   . Arthritis   . Ulcerative colitis   . Paroxysmal atrial fibrillation (HCC)   . Coronary atherosclerosis of native coronary artery     BMS RCA May 2014 - [place]  . Secondary cardiomyopathy (HCC)     LVEF 35% - improved to 45-50%    Past Surgical History  Procedure Laterality Date  . Total knee arthroplasty    . Appendectomy    . Tonsillectomy    . Abdominal hysterectomy    . Bladder surgery    . Bilateral knee replacements       2007, 2008  . Yag laser application Bilateral 16/05/5372    Procedure: YAG LASER APPLICATION;  Surgeon: Williams Che, MD;  Location: [facility];  Service: Ophthalmology;  Laterality: Bilateral;  . Left heart catheterization with coronary angiogram N/A 10/30/2013    Procedure: LEFT HEART CATHETERIZATION WITH CORONARY ANGIOGRAM;  Surgeon: Burnell Blanks, MD;  Location[facility] CATH LAB;  Service: Cardiovascular;  Laterality: N/A;    History reviewed. No pertinent family history. Social History:  reports that she has never smoked. She has never used smokeless tobacco. She reports that she does not drink alcohol or use illicit drugs.  Allergies:  Allergies  Allergen Reactions  . Sinus & Allergy [Chlorpheniramine-Phenylephrine] Shortness Of Breath  . Demerol Rash  . Penicillins Rash and Other (See Comments)    REACTION: rash, years ago    Medications Prior to Admission  Medication Sig Dispense Refill  . atorvastatin (LIPITOR) 20 MG tablet TAKE 1 TABLET BY MOUTH AT BEDTIME 30 tablet 6   . balsalazide (COLAZAL) 750 MG capsule TAKE 3 CAPSULES BY MOUTH THREE TIMES DAILY (REPLACING DELZICOL) 270 capsule 5  . ketotifen (THERA TEARS ALLERGY) 0.025 % ophthalmic solution Place 2 drops into both eyes as needed (for dry eyes).    Marland Kitchen losartan (COZAAR) 25 MG tablet TAKE 1 TABLET BY MOUTH EVERY DAY 90 tablet 3  . metoprolol succinate (TOPROL-XL) 25 MG 24 hr tablet TAKE 1 & 1/2 TABLETS BY MOUTH EVERY DAY 45 tablet 5  . polyethylene glycol-electrolytes (NULYTELY/GOLYTELY) 420 g solution Take 4,000 mLs by mouth once. 4000 mL 0  . potassium chloride SA (K-DUR,KLOR-CON) 20 MEQ tablet TAKE 1/2 TABLET BY MOUTH DAILY. 45 tablet 3  . triamterene-hydrochlorothiazide (MAXZIDE-25) 37.5-25 MG tablet TAKE 1/2 TABLET BY MOUTH DAILY 15 tablet 6  . acetaminophen (TYLENOL) 500 MG tablet Take 1,000 mg by mouth every 8 (eight) hours as needed for mild pain or headache.     Marland Kitchen aspirin 81 MG tablet Take 81 mg by mouth daily.    . nitroGLYCERIN (NITROSTAT) 0.3 MG SL tablet Place 1 tablet (0.3 mg total) under the tongue every 5 (five) minutes x 3 doses as needed for chest pain. 25 tablet 3  . XARELTO 15 MG TABS tablet TAKE 1 TABLET BY MOUTH EVERY DAY 30 tablet 5    No results found for this or any previous visit (from the past 48 hour(s)). No results found.  ROS  Blood pressure 154/68, pulse 51, temperature  98.6 F (37 C), temperature source Oral, resp. rate 15, height 5' 3"  (1.6 m), weight 195 lb (88.451 kg), SpO2 100 %. Physical Exam  Constitutional: She appears well-developed and well-nourished.  HENT:  Mouth/Throat: Oropharynx is clear and moist.  Eyes: Conjunctivae are normal. No scleral icterus.  Neck: No thyromegaly present.  Cardiovascular: Normal rate, regular rhythm and normal heart sounds.   No murmur heard. Respiratory: Effort normal and breath sounds normal.  GI: Soft. She exhibits no distension and no mass. There is no tenderness.  Musculoskeletal: She exhibits no edema.  Lymphadenopathy:     She has no cervical adenopathy.  Neurological: She is alert.  Skin: Skin is warm and dry.     Assessment/Plan Chronic ulcerative colitis. Surveillance colonoscopy.  Hildred Laser, MD 06/15/2015, 12:35 PM

## 2015-06-15 NOTE — Op Note (Signed)
[facility] - Inpatient Patient Name: [name] Procedure Date: 06/15/2015 11:56 AM MRN: [id number] Date of Birth: 1930/10/13 Attending MD: Hildred Laser , MD CSN: [id number] Age: 82 Admit Type: Outpatient Procedure:                Colonoscopy Indications:              High risk colon cancer surveillance: Ulcerative                            pancolitis Providers:                Hildred Laser, MD, Gwenlyn Fudge, RN, Randa Spike, Technician Referring MD:             Rory Percy, MD Medicines:                Meperidine 50 mg IV, Midazolam 4 mg IV Complications:            No immediate complications. Estimated Blood Loss:     Estimated blood loss was minimal. Estimated blood                            loss was minimal. Procedure:                Pre-Anesthesia Assessment:                           - Prior to the procedure, a History and Physical                            was performed, and patient medications and                            allergies were reviewed. The patient's tolerance of                            previous anesthesia was also reviewed. The risks                            and benefits of the procedure and the sedation                            options and risks were discussed with the patient.                            All questions were answered, and informed consent                            was obtained. Prior Anticoagulants: The patient                            last took aspirin 3 days and Xarelto (rivaroxaban)  3 days prior to the procedure. ASA Grade                            Assessment: III - A patient with severe systemic                            disease. After reviewing the risks and benefits,                            the patient was deemed in satisfactory condition to                            undergo the procedure.                           After obtaining informed consent, the colonoscope                           was passed under direct vision. Throughout the                            procedure, the patient's blood pressure, pulse, and                            oxygen saturations were monitored continuously. The                            EC-[id number] ([id number]) scope was introduced through                            the anus and advanced to the the cecum, identified                            by appendiceal orifice and ileocecal valve. The                            colonoscopy was performed without difficulty. The                            patient tolerated the procedure well. The quality                            of the bowel preparation was adequate. The                            ileocecal valve, appendiceal orifice, and rectum                            were photographed. Scope In: 12:43:43 PM Scope Out: 1:15:01 PM Scope Withdrawal Time: 0 hours 14 minutes 5 seconds  Total Procedure Duration: 0 hours 31 minutes 18 seconds  Findings:      Two sessile polyps were found in the cecum. The polyps were 4 to 7 mm in       size. These polyps were  removed with a cold snare. Resection and       retrieval were complete. To prevent bleeding after the polypectomy, one       hemostatic clip was successfully placed (MR conditional). There was no       bleeding at the end of the procedure. The pathology specimen was placed       into Bottle Number 1.      Three sessile polyps were found in the sigmoid colon and splenic       flexure. The polyps were small in size. These were biopsied with a cold       forceps for histology. Verification of patient identification for the       specimen was done by the nurse and technician.      The entire examined colon appeared normal on direct and retroflexion       views.      External hemorrhoids were found during retroflexion. The hemorrhoids       were small.      Anal papilla(e) were hypertrophied. Impression:               - Two 4 to 7 mm  polyps in the cecum, removed with a                            cold snare. Resected and retrieved. Clip (MR                            conditional) was placed.                           - Three small polyps in the sigmoid colon and at                            the splenic flexure. Biopsied.                           - The entire examined colon is normal on direct and                            retroflexion views. Moderate Sedation:      Moderate (conscious) sedation was administered by the endoscopy nurse       and supervised by the endoscopist. The following parameters were       monitored: oxygen saturation, heart rate, blood pressure, CO2       capnography and response to care. Total physician intraservice time was       35 minutes. Recommendation:           - Patient has a contact number available for                            emergencies. The signs and symptoms of potential                            delayed complications were discussed with the                            patient. Return to normal activities tomorrow.  Written discharge instructions were provided to the                            patient.                           - Resume previous diet today.                           - Continue present medications.                           - Resume aspirin in 2 days and Xarelto                            (rivaroxaban) tomorrow at prior doses.                           - Await pathology results.                           - Repeat colonoscopy is recommended for                            surveillance. The colonoscopy date will be                            determined after pathology results from today's                            exam become available for review. Procedure Code(s):        --- Professional ---                           [phone], Colonoscopy, flexible; with removal of                            tumor(s), polyp(s), or other lesion(s) by snare                             technique                           45380, 59, Colonoscopy, flexible; with biopsy,                            single or multiple                           99152, Moderate sedation services provided by the                            same physician or other qualified health care                            professional performing the diagnostic or  therapeutic service that the sedation supports,                            requiring the presence of an independent trained                            observer to assist in the monitoring of the                            patient's level of consciousness and physiological                            status; initial 15 minutes of intraservice time,                            patient age 59 years or older                           [phone], Moderate sedation services; each additional                            15 minutes intraservice time Diagnosis Code(s):        --- Professional ---                           K51.00, Ulcerative (chronic) pancolitis without                            complications                           D12.0, Benign neoplasm of cecum                           D12.5, Benign neoplasm of sigmoid colon                           D12.3, Benign neoplasm of transverse colon (hepatic                            flexure or splenic flexure) CPT copyright 2016 American Medical Association. All rights reserved. The codes documented in this report are preliminary and upon coder review may  be revised to meet current compliance requirements. Hildred Laser, MD Hildred Laser, MD 06/15/2015 1:28:15 PM This report has been signed electronically. Number of Addenda: 0

## 2015-06-17 ENCOUNTER — Encounter (HOSPITAL_COMMUNITY): Payer: Self-pay | Admitting: Internal Medicine

## 2015-06-22 ENCOUNTER — Encounter (INDEPENDENT_AMBULATORY_CARE_PROVIDER_SITE_OTHER): Payer: Medicare Other | Admitting: Ophthalmology

## 2015-06-22 DIAGNOSIS — D3131 Benign neoplasm of right choroid: Secondary | ICD-10-CM | POA: Diagnosis not present

## 2015-06-22 DIAGNOSIS — H35033 Hypertensive retinopathy, bilateral: Secondary | ICD-10-CM

## 2015-06-22 DIAGNOSIS — Z961 Presence of intraocular lens: Secondary | ICD-10-CM | POA: Diagnosis not present

## 2015-06-22 DIAGNOSIS — H43813 Vitreous degeneration, bilateral: Secondary | ICD-10-CM

## 2015-06-22 DIAGNOSIS — I10 Essential (primary) hypertension: Secondary | ICD-10-CM | POA: Diagnosis not present

## 2015-06-22 DIAGNOSIS — H353121 Nonexudative age-related macular degeneration, left eye, early dry stage: Secondary | ICD-10-CM

## 2015-06-22 DIAGNOSIS — H26492 Other secondary cataract, left eye: Secondary | ICD-10-CM | POA: Diagnosis not present

## 2015-06-29 ENCOUNTER — Encounter: Payer: Self-pay | Admitting: Cardiology

## 2015-06-29 ENCOUNTER — Ambulatory Visit (INDEPENDENT_AMBULATORY_CARE_PROVIDER_SITE_OTHER): Payer: Medicare Other | Admitting: Cardiology

## 2015-06-29 VITALS — BP 120/64 | HR 52 | Ht 63.0 in | Wt 199.0 lb

## 2015-06-29 DIAGNOSIS — I480 Paroxysmal atrial fibrillation: Secondary | ICD-10-CM | POA: Diagnosis not present

## 2015-06-29 DIAGNOSIS — I2510 Atherosclerotic heart disease of native coronary artery without angina pectoris: Secondary | ICD-10-CM | POA: Diagnosis not present

## 2015-06-29 DIAGNOSIS — I429 Cardiomyopathy, unspecified: Secondary | ICD-10-CM

## 2015-06-29 NOTE — Progress Notes (Signed)
Cardiology Office Note  Date: 06/29/2015   ID: [name], DOB 1930-06-08, MRN [id number]  PCP: Rory Percy, MD  Primary Cardiologist: Rozann Lesches, MD   Chief Complaint  Patient presents with  . Coronary Artery Disease  . Cardiomyopathy    History of Present Illness: [name] is an 82 y.o. female last seen in December 2016. She presents for a routine follow-up visit. Reports no major change in stamina, no recurring angina symptoms on medical therapy. Also no significant palpitations.  She has not been as active, also not attending [employer] as regularly. We went over her medications. She continues on aspirin, Lipitor, Cozaar, Toprol-XL, and Xarelto. No reported bleeding problems. She will have follow-up lab work with Dr. Nadara Mustard around November.  Past Medical History  Diagnosis Date  . Atrial flutter ([place])   . Depression   . Essential hypertension, benign   . Arthritis   . Ulcerative colitis   . Paroxysmal atrial fibrillation (HCC)   . Coronary atherosclerosis of native coronary artery     BMS RCA May 2014 - [place]  . Secondary cardiomyopathy (HCC)     LVEF 35% - improved to 45-50%    Current Outpatient Prescriptions  Medication Sig Dispense Refill  . acetaminophen (TYLENOL) 500 MG tablet Take 1,000 mg by mouth every 8 (eight) hours as needed for mild pain or headache.     Marland Kitchen aspirin 81 MG tablet Take 1 tablet (81 mg total) by mouth daily.    Marland Kitchen atorvastatin (LIPITOR) 20 MG tablet TAKE 1 TABLET BY MOUTH AT BEDTIME 30 tablet 6  . balsalazide (COLAZAL) 750 MG capsule TAKE 3 CAPSULES BY MOUTH THREE TIMES DAILY (REPLACING DELZICOL) 270 capsule 5  . ketotifen (THERA TEARS ALLERGY) 0.025 % ophthalmic solution Place 2 drops into both eyes as needed (for dry eyes).    Marland Kitchen losartan (COZAAR) 25 MG tablet TAKE 1 TABLET BY MOUTH EVERY DAY 90 tablet 3  . metoprolol succinate (TOPROL-XL) 25 MG 24 hr tablet TAKE 1 & 1/2 TABLETS BY MOUTH EVERY DAY 45 tablet 5  .  nitroGLYCERIN (NITROSTAT) 0.3 MG SL tablet Place 1 tablet (0.3 mg total) under the tongue every 5 (five) minutes x 3 doses as needed for chest pain. 25 tablet 3  . potassium chloride SA (K-DUR,KLOR-CON) 20 MEQ tablet TAKE 1/2 TABLET BY MOUTH DAILY. 45 tablet 3  . Rivaroxaban (XARELTO) 15 MG TABS tablet Take 1 tablet (15 mg total) by mouth daily. 30 tablet 5  . triamterene-hydrochlorothiazide (MAXZIDE-25) 37.5-25 MG tablet TAKE 1/2 TABLET BY MOUTH DAILY 15 tablet 6   No current facility-administered medications for this visit.   Allergies:  Sinus & allergy; Demerol; and Penicillins   Social History: The patient  reports that she has never smoked. She has never used smokeless tobacco. She reports that she does not drink alcohol or use illicit drugs.   ROS:  Please see the history of present illness. Otherwise, complete review of systems is positive for cataracts, arthritic pains in her feet, recent colonoscopy.  All other systems are reviewed and negative.   Physical Exam: VS:  BP 120/64 mmHg  Pulse 52  Ht 5' 3"  (1.6 m)  Wt 199 lb (90.266 kg)  BMI 35.26 kg/m2  SpO2 97%, BMI Body mass index is 35.26 kg/(m^2).  Wt Readings from Last 3 Encounters:  06/29/15 199 lb (90.266 kg)  06/15/15 195 lb (88.451 kg)  05/03/15 192 lb (87.091 kg)    Appears comfortable at rest. Overweight  woman in no acute distress.  HEENT: Conjunctiva and lids normal, oropharynx with moist mucosa.  Neck: Supple, no elevated JVP or carotid bruits.  Lungs: Clear to auscultation, nonlabored.  Cardiac: RRR with ectopy, no S3 or rub.  Abdomen: Soft, nontender, bowel sounds present.  Extremities: No pitting edema and possibly mild lymphedema.   ECG: I personally reviewed the tracing from 01/06/2015 which showed sinus rhythm with prolonged PR interval and IVCD consistent with incomplete left bundle branch block, nonspecific T-wave changes.  Recent Labwork:  November 2016: Hemoglobin 13.6, platelets 351, BUN 16,  creatinine 0.7, potassium 4.3, AST 28, ALT 22, cholesterol 118, triglycerides 78, HDL 52, LDL 50  Other Studies Reviewed Today:  Echocardiogram 09/16/2013: Moderate LVH with LVEF 45-50%, inferior and inferolateral hypokinesis, grade 1 diastolic dysfunction, mild to moderate left atrial enlargement, minor MAC with mild mitral regurgitation, mildly dilated aortic root, trivial tricuspid regurgitation, unable to assess PASP.   Cardiac catheterization 10/30/2013: Hemodynamic Findings: Central aortic pressure: 138/63 Left ventricular pressure: 136/10/17  Angiographic Findings:  Left main: 10% distal stenosis.   Left Anterior Descending Artery: Large caliber vessel that gives off a bifurcating moderate caliber septal perforating branch and then continuation to the apex with one moderate caliber diagonal branch. The proximal LAD has mild plaque. The mid LAD has diffuse 20% stenosis. The distal vessel has no obstructive disease. The diagonal branch is moderate in caliber with mild plaque.   Circumflex Artery: Moderate caliber vessel with 20% ostial stenosis, moderate caliber obtuse marginal branch with mild plaque. The intermediate branch is large in caliber with mild diffuse plaque.   Right Coronary Artery: Large dominant vessel. The proximal vessel has mild luminal irregularities. The mid stented segment is patent with mild, 20% diffuse stent restenosis. The distal vessel, PDA and PLA have mild luminal irregularities.   Assessment and Plan:  1. Symptomatically stable CAD status post BMS to the RCA in May 2014. Patent stent site documented in October of last year. Continue medical therapy and observation.  2. Paroxysmal atrial fibrillation and history of atrial flutter. Maintaining sinus rhythm on current regimen which includes Toprol-XL and Xarelto. She will have follow-up lab work with Dr. Nadara Mustard later this year. No reported bleeding problems.  3. History of cardiomyopathy with improvement  in LVEF to the range of 45-50%. No active heart failure symptoms.  Current medicines were reviewed with the patient today.  Disposition: Follow-up with me in 6 months.  Signed, Satira Sark, MD, [facility] 06/29/2015 10:17 AM    [facility] [place], [place], [place] [address] Phone: [phone]; Fax: [phone]

## 2015-06-29 NOTE — Patient Instructions (Signed)
Medication Instructions:  Continue all current medications.  Labwork: NONE  Testing/Procedures: NONE  Follow-Up: Your physician wants you to follow up in: 6 months.  You will receive a reminder letter in the mail one-two months in advance.  If you don't receive a letter, please call our office to schedule the follow up appointment    If you need a refill on your cardiac medications before your next appointment, please call your pharmacy.

## 2015-08-24 ENCOUNTER — Other Ambulatory Visit: Payer: Self-pay | Admitting: Cardiology

## 2015-08-24 ENCOUNTER — Other Ambulatory Visit (INDEPENDENT_AMBULATORY_CARE_PROVIDER_SITE_OTHER): Payer: Self-pay | Admitting: Internal Medicine

## 2015-09-14 DIAGNOSIS — J0101 Acute recurrent maxillary sinusitis: Secondary | ICD-10-CM | POA: Diagnosis not present

## 2015-09-14 DIAGNOSIS — Z6835 Body mass index (BMI) 35.0-35.9, adult: Secondary | ICD-10-CM | POA: Diagnosis not present

## 2015-10-13 ENCOUNTER — Encounter (HOSPITAL_COMMUNITY): Payer: Self-pay | Admitting: *Deleted

## 2015-10-13 ENCOUNTER — Telehealth: Payer: Self-pay | Admitting: Cardiology

## 2015-10-13 ENCOUNTER — Emergency Department (HOSPITAL_COMMUNITY): Payer: Medicare Other

## 2015-10-13 ENCOUNTER — Emergency Department (HOSPITAL_COMMUNITY)
Admission: EM | Admit: 2015-10-13 | Discharge: 2015-10-13 | Disposition: A | Discharge status: Home / Self Care | Payer: Medicare Other | Attending: Emergency Medicine | Admitting: Emergency Medicine

## 2015-10-13 DIAGNOSIS — Z7982 Long term (current) use of aspirin: Secondary | ICD-10-CM | POA: Insufficient documentation

## 2015-10-13 DIAGNOSIS — Z79899 Other long term (current) drug therapy: Secondary | ICD-10-CM | POA: Insufficient documentation

## 2015-10-13 DIAGNOSIS — R404 Transient alteration of awareness: Secondary | ICD-10-CM | POA: Diagnosis not present

## 2015-10-13 DIAGNOSIS — I2510 Atherosclerotic heart disease of native coronary artery without angina pectoris: Secondary | ICD-10-CM | POA: Diagnosis not present

## 2015-10-13 DIAGNOSIS — R42 Dizziness and giddiness: Secondary | ICD-10-CM | POA: Diagnosis not present

## 2015-10-13 DIAGNOSIS — I10 Essential (primary) hypertension: Secondary | ICD-10-CM | POA: Diagnosis not present

## 2015-10-13 DIAGNOSIS — R531 Weakness: Secondary | ICD-10-CM | POA: Diagnosis not present

## 2015-10-13 LAB — URINALYSIS, ROUTINE W REFLEX MICROSCOPIC
Bilirubin Urine: NEGATIVE
Glucose, UA: NEGATIVE mg/dL
Hgb urine dipstick: NEGATIVE
Ketones, ur: NEGATIVE mg/dL
Leukocytes, UA: NEGATIVE
Nitrite: NEGATIVE
Protein, ur: NEGATIVE mg/dL
Specific Gravity, Urine: 1.01 (ref 1.005–1.030)
pH: 8 (ref 5.0–8.0)

## 2015-10-13 LAB — CBC WITH DIFFERENTIAL/PLATELET
Basophils Absolute: 0 10*3/uL (ref 0.0–0.1)
Basophils Relative: 0 %
Eosinophils Absolute: 0.1 10*3/uL (ref 0.0–0.7)
Eosinophils Relative: 2 %
HCT: 39.2 % (ref 36.0–46.0)
Hemoglobin: 13.2 g/dL (ref 12.0–15.0)
Lymphocytes Relative: 22 %
Lymphs Abs: 1.6 10*3/uL (ref 0.7–4.0)
MCH: 30 pg (ref 26.0–34.0)
MCHC: 33.7 g/dL (ref 30.0–36.0)
MCV: 89.1 fL (ref 78.0–100.0)
Monocytes Absolute: 0.6 10*3/uL (ref 0.1–1.0)
Monocytes Relative: 8 %
Neutro Abs: 5 10*3/uL (ref 1.7–7.7)
Neutrophils Relative %: 68 %
Platelets: 285 10*3/uL (ref 150–400)
RBC: 4.4 MIL/uL (ref 3.87–5.11)
RDW: 13.8 % (ref 11.5–15.5)
WBC: 7.3 10*3/uL (ref 4.0–10.5)

## 2015-10-13 LAB — BASIC METABOLIC PANEL
Anion gap: 8 (ref 5–15)
BUN: 17 mg/dL (ref 6–20)
CO2: 26 mmol/L (ref 22–32)
Calcium: 9.6 mg/dL (ref 8.9–10.3)
Chloride: 102 mmol/L (ref 101–111)
Creatinine, Ser: 0.57 mg/dL (ref 0.44–1.00)
GFR calc Af Amer: >60 mL/min (ref >60)
GFR calc non Af Amer: >60 mL/min (ref >60)
Glucose, Bld: 95 mg/dL (ref 65–99)
Potassium: 4 mmol/L (ref 3.5–5.1)
Sodium: 136 mmol/L (ref 135–145)

## 2015-10-13 LAB — TROPONIN I: Troponin I: <0.03 ng/mL (ref <0.03)

## 2015-10-13 MED ORDER — PROMETHAZINE HCL 25 MG/ML IJ SOLN
6.2500 mg | Freq: Once | INTRAMUSCULAR | Status: AC
  Administered 2015-10-13: 6.25 mg via INTRAVENOUS
  Filled 2015-10-13: qty 1

## 2015-10-13 MED ORDER — MECLIZINE HCL 12.5 MG PO TABS
25.0000 mg | ORAL_TABLET | Freq: Once | ORAL | Status: AC
  Administered 2015-10-13: 25 mg via ORAL
  Filled 2015-10-13: qty 2

## 2015-10-13 MED ORDER — MECLIZINE HCL 12.5 MG PO TABS: 12.5000 mg | ORAL_TABLET | Freq: Three times a day (TID) | ORAL | 0 refills | Status: DC | PRN

## 2015-10-13 NOTE — ED Triage Notes (Signed)
Pt reports weakness, dizziness and nausea onset this 7am.  Pt says symptoms started as she stood up.  Pt reports history of atrial fibrillation and stent placement.

## 2015-10-13 NOTE — Telephone Encounter (Signed)
Mr, Bevis called stating that his wife [name] woke up with extreme dizziness.   Checked her HR was 39. States that she is not having CP or shortness of breath.

## 2015-10-13 NOTE — Telephone Encounter (Signed)
Discussed symptoms with patient.  No c/o chest pain or SOB.  No fever.  Patient is not diabetic.  Does c/o being dizzy with seeing spots, nausea.  Stated she just does not feel right.  Most recent BP check was 172/79 with heart rate of 57.  With history of atrial fib & flutter - advised that her heart rates may or may not be accurate if she is out of rhythm.  Nurse advised to be evaluated at ED.  Husband will take her to [facility].

## 2015-10-13 NOTE — ED Provider Notes (Signed)
[facility] DEPT Provider Note   CSN: [id number] Arrival date & time: 10/13/15  1133     History   Chief Complaint Chief Complaint  Patient presents with  . Weakness    HPI EKTA DANCER is a 82 y.o. female.  HPI   82 year old female with dizziness. She describes weakness and feeling off balance around 70 was morning. Symptoms started as she was getting up. Improved with rest. Denies any acute pain. Respiratory complaints. No fevers or chills. She currently feels fine while laying in bed. No acute numbness, tingling or focal loss of strength. No acute visual complaints.  Past Medical History:  Diagnosis Date  . Arthritis   . Atrial flutter ([place])   . Coronary atherosclerosis of native coronary artery    BMS RCA May 2014 - [place]  . Depression   . Essential hypertension, benign   . Paroxysmal atrial fibrillation (H[facility])   . Secondary cardiomyopathy (HCC)    LVEF 35% - improved to 45-50%  . Ulcerative colitis     Patient Active Problem List   Diagnosis Date Noted  . Ulcerative colitis ([place]) 05/03/2014  . Bilateral leg edema 11/30/2013  . Abnormal myocardial perfusion study 09/17/2013  . Coronary atherosclerosis of native coronary artery 09/03/2012  . Secondary cardiomyopathy ([place]) 09/03/2012  . Paroxysmal atrial fibrillation ([place]) 03/12/2012  . Mixed hyperlipidemia 01/21/2009  . Essential hypertension, benign 01/21/2009  . ATRIAL FLUTTER 10/06/2008    Past Surgical History:  Procedure Laterality Date  . ABDOMINAL HYSTERECTOMY    . APPENDECTOMY    . Bilateral knee replacements      2007, 2008  . BLADDER SURGERY    . COLONOSCOPY N/A 06/15/2015   Procedure: COLONOSCOPY;  Surgeon: Rogene Houston, MD;  Location: [facility] ENDO SU[facility];  Service: Endoscopy;  Laterality: N/A;  210  . LEFT HEART CATHETERIZATION WITH CORONARY ANGIOGRAM N/A 10/30/2013   Procedure: LEFT HEART CATHETERIZATION WITH CORONARY ANGIOGRAM;  Surgeon: Burnell Blanks, MD;  Location: [facility] CATH  LAB;  Service: Cardiovascular;  Laterality: N/A;  . TONSILLECTOMY    . TOTAL KNEE ARTHROPLASTY    . YAG LASER APPLICATION Bilateral 87/05/6431   Procedure: YAG LASER APPLICATION;  Surgeon: Williams Che, MD;  Location: [facility] ORS;  Service: Ophthalmology;  Laterality: Bilateral;    OB History    No data available       Home Medications    Prior to Admission medications   Medication Sig Start Date End Date Taking? Authorizing Provider  acetaminophen (TYLENOL) 500 MG tablet Take 1,000 mg by mouth every 8 (eight) hours as needed for mild pain or headache.     Historical Provider, MD  aspirin 81 MG tablet Take 1 tablet (81 mg total) by mouth daily. 06/17/15   Rogene Houston, MD  atorvastatin (LIPITOR) 20 MG tablet TAKE 1 TABLET BY MOUTH AT BEDTIME 08/24/15   Satira Sark, MD  balsalazide (COLAZAL) 750 MG capsule TAKE 3 CAPSULES BY MOUTH THREE TIMES DAILY (REPLACING DELZICOL) 08/24/15   Butch Penny, NP  ketotifen (THERA TEARS ALLERGY) 0.025 % ophthalmic solution Place 2 drops into both eyes as needed (for dry eyes).    Historical Provider, MD  losartan (COZAAR) 25 MG tablet TAKE 1 TABLET BY MOUTH EVERY DAY 11/29/14   Satira Sark, MD  metoprolol succinate (TOPROL-XL) 25 MG 24 hr tablet TAKE 1 & 1/2 TABLETS BY MOUTH EVERY DAY 04/26/15   Satira Sark, MD  nitroGLYCERIN (NITROSTAT) 0.3 MG SL tablet Place 1 tablet (  0.3 mg total) under the tongue every 5 (five) minutes x 3 doses as needed for chest pain. 08/11/14   Satira Sark, MD  potassium chloride SA (K-DUR,KLOR-CON) 20 MEQ tablet TAKE 1/2 TABLET BY MOUTH DAILY. 11/29/14   Satira Sark, MD  Rivaroxaban (XARELTO) 15 MG TABS tablet Take 1 tablet (15 mg total) by mouth daily. 06/16/15   Rogene Houston, MD  triamterene-hydrochlorothiazide (MAXZIDE-25) 37.5-25 MG tablet TAKE 1/2 TABLET BY MOUTH DAILY 03/28/15   Satira Sark, MD    Family History History reviewed. No pertinent family history.  Social History Social  History  Substance Use Topics  . Smoking status: Never Smoker  . Smokeless tobacco: Never Used  . Alcohol use No     Allergies   Sinus & allergy [chlorpheniramine-phenylephrine]; Demerol; and Penicillins   Review of Systems Review of Systems  All systems reviewed and negative, other than as noted in HPI.   Physical Exam Updated Vital Signs BP 153/75   Pulse (!) 49   Temp 98.3 F (36.8 C) (Oral)   Resp 13   Ht 5' 3"  (1.6 m)   Wt 200 lb (90.7 kg)   SpO2 92%   BMI 35.43 kg/m   Physical Exam  Constitutional: She appears well-developed and well-nourished. No distress.  HENT:  Head: Normocephalic and atraumatic.  Eyes: Conjunctivae are normal. Right eye exhibits no discharge. Left eye exhibits no discharge.  Neck: Neck supple.  Cardiovascular: Normal heart sounds.  Exam reveals no gallop and no friction rub.   No murmur heard. Mild bradycardia. irregular  Pulmonary/Chest: Effort normal and breath sounds normal. No respiratory distress.  Abdominal: Soft. She exhibits no distension. There is no tenderness.  Musculoskeletal: She exhibits no edema or tenderness.  Lower extremities symmetric as compared to each other. No calf tenderness. Negative Homan's. No palpable cords.   Neurological: She is alert.  Skin: Skin is warm and dry.  Psychiatric: She has a normal mood and affect. Her behavior is normal. Thought content normal.  Nursing note and vitals reviewed.    ED Treatments / Results  Labs (all labs ordered are listed, but only abnormal results are displayed) Labs Reviewed  CBC WITH DIFFERENTIAL/PLATELET  BASIC METABOLIC PANEL  TROPONIN I  URINALYSIS, ROUTINE W REFLEX MICROSCOPIC (NOT AT [facility])    EKG  EKG Interpretation  Date/Time:  Thursday October 13 2015 11:39:40 EDT Ventricular Rate:  53 PR Interval:    QRS Duration: 155 QT Interval:  400 QTC Calculation: 376 R Axis:   -45 Text Interpretation:  Sinus rhythm Prolonged PR interval Left bundle branch  block Confirmed by Wilson Singer  MD, Ryley Bachtel [phone]) on 10/13/2015 11:53:26 AM       Radiology Ct Head Wo Contrast  Result Date: 10/13/2015 CLINICAL DATA:  c/o being dizzy with seeing spots, nausea. Stated she just does not feel right.hx A fib. EXAM: CT HEAD WITHOUT CONTRAST TECHNIQUE: Contiguous axial images were obtained from the base of the skull through the vertex without intravenous contrast. COMPARISON:  09/29/2005 FINDINGS: Brain: There is mild central and cortical atrophy. Periventricular white matter changes are consistent with small vessel disease. There is no intra or extra-axial fluid collection or mass lesion. The basilar cisterns and ventricles have a normal appearance. There is no CT evidence for acute infarction or hemorrhage. Vascular: No hyperdense vessel or unexpected calcification. Skull: Normal. Negative for fracture or focal lesion. Sinuses/Orbits: Paranasal sinuses are normally aerated. Mastoid air cells are normally aerated. Other: Study quality is degraded by  patient motion artifact. IMPRESSION: 1. Atrophy and small vessel disease. 2.  No evidence for acute intracranial abnormality. Electronically Signed   By: Nolon Nations M.D.   On: 10/13/2015 13:14    Procedures Procedures (including critical care time)  Medications Ordered in ED Medications  meclizine (ANTIVERT) tablet 25 mg (25 mg Oral Given 10/13/15 1233)  promethazine (PHENERGAN) injection 6.25 mg (6.25 mg Intravenous Given 10/13/15 1232)     Initial Impression / Assessment and Plan / ED Course  I have reviewed the triage vital signs and the nursing notes.  Pertinent labs & imaging results that were available during my care of the patient were reviewed by me and considered in my medical decision making (see chart for details).  Clinical Course    85yF with dizziness. Denies CP/dyspnea. She described HR in high 30s at home when initially checked. She has been mildly bradycardic in the ED (50-55). BPs have been  consistent good with this though. I don't have the impression that this is cardiac in etiology. Neuro exam is nonfocal.  I suspect this is peripheral vertigo. Symptoms positional/worse with movement. Will give script for meclizine. It has been determined that no acute conditions requiring further emergency intervention are present at this time. The patient has been advised of the diagnosis and plan. I reviewed any labs and imaging including any potential incidental findings. We have discussed signs and symptoms that warrant return to the ED and they are listed in the discharge instructions.    Final Clinical Impressions(s) / ED Diagnoses   Final diagnoses:  Dizziness    New Prescriptions New Prescriptions   No medications on file     Virgel Manifold, MD 10/17/15 [phone]

## 2015-10-17 DIAGNOSIS — Z6835 Body mass index (BMI) 35.0-35.9, adult: Secondary | ICD-10-CM | POA: Diagnosis not present

## 2015-10-17 DIAGNOSIS — R42 Dizziness and giddiness: Secondary | ICD-10-CM | POA: Diagnosis not present

## 2015-10-24 ENCOUNTER — Other Ambulatory Visit: Payer: Self-pay | Admitting: Cardiology

## 2015-11-22 ENCOUNTER — Other Ambulatory Visit: Payer: Self-pay | Admitting: Cardiology

## 2015-12-08 DIAGNOSIS — I482 Chronic atrial fibrillation: Secondary | ICD-10-CM | POA: Diagnosis not present

## 2015-12-08 DIAGNOSIS — R739 Hyperglycemia, unspecified: Secondary | ICD-10-CM | POA: Diagnosis not present

## 2015-12-08 DIAGNOSIS — R5383 Other fatigue: Secondary | ICD-10-CM | POA: Diagnosis not present

## 2015-12-08 DIAGNOSIS — I10 Essential (primary) hypertension: Secondary | ICD-10-CM | POA: Diagnosis not present

## 2015-12-12 DIAGNOSIS — Z6837 Body mass index (BMI) 37.0-37.9, adult: Secondary | ICD-10-CM | POA: Diagnosis not present

## 2015-12-12 DIAGNOSIS — Z0000 Encounter for general adult medical examination without abnormal findings: Secondary | ICD-10-CM | POA: Diagnosis not present

## 2015-12-12 DIAGNOSIS — Z23 Encounter for immunization: Secondary | ICD-10-CM | POA: Diagnosis not present

## 2015-12-15 ENCOUNTER — Ambulatory Visit (INDEPENDENT_AMBULATORY_CARE_PROVIDER_SITE_OTHER): Payer: Self-pay

## 2015-12-15 ENCOUNTER — Ambulatory Visit (INDEPENDENT_AMBULATORY_CARE_PROVIDER_SITE_OTHER): Payer: Medicare Other | Admitting: Orthopaedic Surgery

## 2015-12-15 VITALS — BP 138/76 | HR 66 | Ht 63.0 in | Wt 209.0 lb

## 2015-12-15 DIAGNOSIS — M79671 Pain in right foot: Secondary | ICD-10-CM | POA: Diagnosis not present

## 2015-12-15 DIAGNOSIS — M25511 Pain in right shoulder: Secondary | ICD-10-CM

## 2015-12-15 DIAGNOSIS — G8929 Other chronic pain: Secondary | ICD-10-CM

## 2015-12-15 NOTE — Progress Notes (Signed)
Office Visit Note   Patient: [name]           Date of Birth: 08/21/1930           MRN: [id number] Visit Date: 12/15/2015              Requested by: Rory Percy, MD [place], [place] [address] PCP: Rory Percy, MD   Assessment & Plan: Visit Diagnoses:  1. Pain in right foot   2. Chronic right shoulder pain     Plan: Subacromial injection performed with some temporary relief. We'll see her shoulder doesn't she has persistent possibly consider diagnostic imaging. She has a degenerative changes on the dorsum of her foot which is painful with shoe wear and we discussed the treatment options shoes that have a large buildup. Blue superfeet recommended. She should do well as long as she has shoes with a large large buildup help unload the degenerative midfoot. Follow-up 1 month..  Follow-Up Instructions: Return in about 1 month (around 01/15/2016).   Orders:  Orders Placed This Encounter  Procedures  . Large Joint Injection/Arthrocentesis  . XR Foot Complete Right   No orders of the defined types were placed in this encounter.     Procedures: Large Joint Inj Date/Time: 12/18/2015 4:00 PM Performed by: Marybelle Killings Authorized by: Marybelle Killings   Consent Given by:  Patient Indications:  Pain Location:  Shoulder Site:  R subacromial bursa Needle Size:  22 G Needle Length:  1.5 inches Ultrasound Guidance: No   Fluoroscopic Guidance: No   Arthrogram: No   Medications:  1 mL lidocaine 1 %; 40 mg methylPREDNISolone acetate 40 MG/ML; 4 mL bupivacaine 0.25 % Aspiration Attempted: No   Patient tolerance:  Patient tolerated the procedure well with no immediate complications     Clinical Data: No additional findings.   Subjective: Chief Complaint  Patient presents with  . Right Shoulder - Pain  . Right Foot - Pain    Patient presents with right shoulder and right foot pain. She states that she has had right shoulder pain x 2-3 years. She cannot raise her  arm and her upper arm aches at night. She states that she has a "knot" over her tarsals.  She has pain in the 1st MTP joint and swelling.  She does state that Yager's linament helps her shoulder.  Patient's had problems with shoe wear. Problems on the dorsum of foot she is a retired in this region are painful. She does better with the type of shoe that does not come proximally up the dorsum of the foot where she is tender. Shoulder pain has been chronic aching at night pain with stretch reaching and overhead activities times at least 2-1/2 years.  Review of Systems  Constitutional: Negative for chills and diaphoresis.  HENT: Negative for ear discharge, ear pain and nosebleeds.   Eyes: Negative for discharge and visual disturbance.  Respiratory: Negative for cough, choking and shortness of breath.   Cardiovascular: Negative for chest pain and palpitations.  Gastrointestinal: Negative for abdominal distention and abdominal pain.  Endocrine: Negative for cold intolerance and heat intolerance.  Genitourinary: Negative for flank pain and hematuria.  Skin: Negative for rash and wound.  Neurological: Negative for seizures and speech difficulty.  Hematological: Negative for adenopathy. Does not bruise/bleed easily.  Psychiatric/Behavioral: Negative for agitation and suicidal ideas.     Objective: Vital Signs: BP 138/76   Pulse 66   Ht 5' 3"  (1.6 m)  Wt 209 lb (94.8 kg)   BMI 37.02 kg/m   Physical Exam  Constitutional: She is oriented to person, place, and time. She appears well-developed.  HENT:  Head: Normocephalic.  Right Ear: External ear normal.  Left Ear: External ear normal.  Eyes: Pupils are equal, round, and reactive to light.  Neck: No tracheal deviation present. No thyromegaly present.  Cardiovascular: Normal rate.   Pulmonary/Chest: Effort normal.  Abdominal: Soft.  Musculoskeletal:  There is prominence proximally on the right foot about even with the talonavicular joint.  It's firm. She has some numbness over the dorsum of her foot worse with pressure over the top palpable osteophyte. Subtalar motion is normal she has good ankle range of motion. Posterior tibial tendon is active. Right shoulder shows positive drop arm test. Good cervical range of motion. Lungs the biceps is tender. No but no distal biceps migration lateral epicondyles normal evidence peripheral nerve entrapment.  Neurological: She is alert and oriented to person, place, and time.  Skin: Skin is warm and dry.  Psychiatric: She has a normal mood and affect. Her behavior is normal.    Ortho Exam left foot shows no prominence over the dorsum of the foot normal sensation. Pedal pulses are normal.  Specialty Comments:  No specialty comments available.  Imaging: No results found.   PMFS History: Patient Active Problem List   Diagnosis Date Noted  . Ulcerative colitis ([place]) 05/03/2014  . Bilateral leg edema 11/30/2013  . Abnormal myocardial perfusion study 09/17/2013  . Coronary atherosclerosis of native coronary artery 09/03/2012  . Secondary cardiomyopathy ([place]) 09/03/2012  . Paroxysmal atrial fibrillation ([place]) 03/12/2012  . Mixed hyperlipidemia 01/21/2009  . Essential hypertension, benign 01/21/2009  . ATRIAL FLUTTER 10/06/2008   Past Medical History:  Diagnosis Date  . Arthritis   . Atrial flutter ([place])   . Coronary atherosclerosis of native coronary artery    BMS RCA May 2014 - [place]  . Depression   . Essential hypertension, benign   . Paroxysmal atrial fibrillation (H[facility])   . Secondary cardiomyopathy (H[facility])    LVEF 35% - improved to 45-50%  . Ulcerative colitis     No family history on file.  Past Surgical History:  Procedure Laterality Date  . ABDOMINAL HYSTERECTOMY    . APPENDECTOMY    . Bilateral knee replacements      2007, 2008  . BLADDER SURGERY    . COLONOSCOPY N/A 06/15/2015   Procedure: COLONOSCOPY;  Surgeon: Rogene Houston, MD;  Location: [facility] ENDO SU[facility];   Service: Endoscopy;  Laterality: N/A;  210  . LEFT HEART CATHETERIZATION WITH CORONARY ANGIOGRAM N/A 10/30/2013   Procedure: LEFT HEART CATHETERIZATION WITH CORONARY ANGIOGRAM;  Surgeon: Burnell Blanks, MD;  Location: [facility] CATH LAB;  Service: Cardiovascular;  Laterality: N/A;  . TONSILLECTOMY    . TOTAL KNEE ARTHROPLASTY    . YAG LASER APPLICATION Bilateral 47/0/9295   Procedure: YAG LASER APPLICATION;  Surgeon: Williams Che, MD;  Location: [facility] ORS;  Service: Ophthalmology;  Laterality: Bilateral;   Social History   Occupational History  . Not on file.   Social History Main Topics  . Smoking status: Never Smoker  . Smokeless tobacco: Never Used  . Alcohol use No  . Drug use: No  . Sexual activity: Not on file

## 2015-12-18 ENCOUNTER — Encounter (INDEPENDENT_AMBULATORY_CARE_PROVIDER_SITE_OTHER): Payer: Self-pay | Admitting: Orthopaedic Surgery

## 2015-12-18 DIAGNOSIS — M25511 Pain in right shoulder: Secondary | ICD-10-CM

## 2015-12-18 MED ORDER — METHYLPREDNISOLONE ACETATE 40 MG/ML IJ SUSP
40.0000 mg | INTRAMUSCULAR | Status: AC | PRN
  Administered 2015-12-18: 40 mg via INTRA_ARTICULAR

## 2015-12-18 MED ORDER — BUPIVACAINE HCL 0.25 % IJ SOLN
4.0000 mL | INTRAMUSCULAR | Status: AC | PRN
  Administered 2015-12-18: 4 mL via INTRA_ARTICULAR

## 2015-12-18 MED ORDER — LIDOCAINE HCL 1 % IJ SOLN
1.0000 mL | INTRAMUSCULAR | Status: AC | PRN
  Administered 2015-12-18: 1 mL

## 2016-01-12 ENCOUNTER — Ambulatory Visit (INDEPENDENT_AMBULATORY_CARE_PROVIDER_SITE_OTHER): Payer: Medicare Other | Admitting: Orthopaedic Surgery

## 2016-01-12 ENCOUNTER — Encounter (INDEPENDENT_AMBULATORY_CARE_PROVIDER_SITE_OTHER): Payer: Self-pay | Admitting: Orthopaedic Surgery

## 2016-01-12 VITALS — BP 108/53 | HR 58 | Ht 63.0 in | Wt 205.0 lb

## 2016-01-12 DIAGNOSIS — M19079 Primary osteoarthritis, unspecified ankle and foot: Secondary | ICD-10-CM

## 2016-01-12 DIAGNOSIS — M25511 Pain in right shoulder: Secondary | ICD-10-CM

## 2016-01-12 NOTE — Progress Notes (Signed)
Office Visit Note   Patient: [name]           Date of Birth: 02-27-1930           MRN: [id number] Visit Date: 01/12/2016              Requested by: Rory Percy, MD [place], [place] [address] PCP: Rory Percy, MD   Assessment & Plan: Visit Diagnoses:  1. Arthritis of midfoot   2. Right shoulder pain, unspecified chronicity     Plan: Continue observation. Patient's gotten over 75% relief from the subacromial injection of her right shoulder for shoulder impingement and also 75% relief of her foot pain using the blue superficial insert to support her arch and right midfoot degenerative changes. She's happy with the results of treatment and will check her back again on a when necessary basis.  Follow-Up Instructions: Return if symptoms worsen or fail to improve.   Orders:  No orders of the defined types were placed in this encounter.  No orders of the defined types were placed in this encounter.     Procedures: No procedures performed   Clinical Data: No additional findings.   Subjective: Chief Complaint  Patient presents with  . Right Shoulder - Follow-up, Pain  . Right Foot - Follow-up, Pain    Patient returns for follow up right shoulder and right foot pain. She states the injection helped her shoulder. She also states the inserts have helped her feet.    Review of Systems 14 point review systems updated from last month's visit and are unchanged. She's gotten about 75% relief of her problems with her feet with the inserts with midfoot also osteophyte formation and with the subacromial injection in her right shoulder.   Objective: Vital Signs: BP (!) 108/53   Pulse (!) 58   Ht 5' 3"  (1.6 m)   Wt 205 lb (93 kg)   BMI 36.31 kg/m   Physical Exam  Constitutional: She is oriented to person, place, and time. She appears well-developed.  HENT:  Head: Normocephalic.  Right Ear: External ear normal.  Left Ear: External ear normal.  Eyes: Pupils  are equal, round, and reactive to light.  Neck: No tracheal deviation present. No thyromegaly present.  Cardiovascular: Normal rate.   Pulmonary/Chest: Effort normal.  Abdominal: Soft.  Musculoskeletal:  Patient can get her right hand overhead down easily without pain. Occasionally without surgery region she has mild discomfort. Negative drop arm test minimal positive impingement right shoulder negative on the left arm. Right dorsal midfoot palpable osteophytes. Today she is improved but still uses inserts. When she uses the super feet insert (blue) tissue she is able to ambulate and does not have pain. Good capillary refill distal pulses are intact. Posterior tibial and peroneals are active good gastrocsoleus strength and normal ankle dorsiflexion. Negative straight leg raising.  Neurological: She is alert and oriented to person, place, and time.  Skin: Skin is warm and dry.  Psychiatric: She has a normal mood and affect. Her behavior is normal.      Ortho Exam  Specialty Comments:  No specialty comments available.  Imaging: No results found.   PMFS History: Patient Active Problem List   Diagnosis Date Noted  . Arthritis of midfoot 01/13/2016  . Right shoulder pain 01/13/2016  . Ulcerative colitis ([place]) 05/03/2014  . Bilateral leg edema 11/30/2013  . Abnormal myocardial perfusion study 09/17/2013  . Coronary atherosclerosis of native coronary artery 09/03/2012  .  Secondary cardiomyopathy ([place]) 09/03/2012  . Paroxysmal atrial fibrillation ([place]) 03/12/2012  . Mixed hyperlipidemia 01/21/2009  . Essential hypertension, benign 01/21/2009  . ATRIAL FLUTTER 10/06/2008   Past Medical History:  Diagnosis Date  . Arthritis   . Atrial flutter ([place])   . Coronary atherosclerosis of native coronary artery    BMS RCA May 2014 - [place]  . Depression   . Essential hypertension, benign   . Paroxysmal atrial fibrillation ([facility])   . Secondary cardiomyopathy ([facility])    LVEF 35% - improved  to 45-50%  . Ulcerative colitis     No family history on file.  Past Surgical History:  Procedure Laterality Date  . ABDOMINAL HYSTERECTOMY    . APPENDECTOMY    . Bilateral knee replacements      2007, 2008  . BLADDER SURGERY    . COLONOSCOPY N/A 06/15/2015   Procedure: COLONOSCOPY;  Surgeon: Rogene Houston, MD;  [facility];  Service: Endoscopy;  Laterality: N/A;  210  . LEFT HEART CATHETERIZATION WITH CORONARY ANGIOGRAM N/A 10/30/2013   Procedure: LEFT HEART CATHETERIZATION WITH CORONARY ANGIOGRAM;  Surgeon: Burnell Blanks, MD;  Location: [facility] CATH LAB;  Service: Cardiovascular;  Laterality: N/A;  . TONSILLECTOMY    . TOTAL KNEE ARTHROPLASTY    . YAG LASER APPLICATION Bilateral 59/0/9311   Procedure: YAG LASER APPLICATION;  Surgeon: Williams Che, MD;  Location: [facility] ORS;  Service: Ophthalmology;  Laterality: Bilateral;   Social History   Occupational History  . Not on file.   Social History Main Topics  . Smoking status: Never Smoker  . Smokeless tobacco: Never Used  . Alcohol use No  . Drug use: No  . Sexual activity: Not on file

## 2016-01-13 DIAGNOSIS — M19079 Primary osteoarthritis, unspecified ankle and foot: Secondary | ICD-10-CM | POA: Insufficient documentation

## 2016-01-13 DIAGNOSIS — M25511 Pain in right shoulder: Secondary | ICD-10-CM | POA: Insufficient documentation

## 2016-02-20 ENCOUNTER — Other Ambulatory Visit: Payer: Self-pay | Admitting: Cardiology

## 2016-02-20 ENCOUNTER — Other Ambulatory Visit (INDEPENDENT_AMBULATORY_CARE_PROVIDER_SITE_OTHER): Payer: Self-pay | Admitting: Internal Medicine

## 2016-02-28 ENCOUNTER — Encounter: Payer: Self-pay | Admitting: Cardiology

## 2016-02-28 ENCOUNTER — Telehealth: Payer: Self-pay | Admitting: *Deleted

## 2016-02-28 ENCOUNTER — Ambulatory Visit (INDEPENDENT_AMBULATORY_CARE_PROVIDER_SITE_OTHER): Payer: Medicare Other | Admitting: Cardiology

## 2016-02-28 VITALS — BP 128/70 | HR 61 | Ht 63.0 in | Wt 212.0 lb

## 2016-02-28 DIAGNOSIS — R001 Bradycardia, unspecified: Secondary | ICD-10-CM | POA: Diagnosis not present

## 2016-02-28 DIAGNOSIS — I480 Paroxysmal atrial fibrillation: Secondary | ICD-10-CM | POA: Diagnosis not present

## 2016-02-28 DIAGNOSIS — R5383 Other fatigue: Secondary | ICD-10-CM

## 2016-02-28 DIAGNOSIS — I429 Cardiomyopathy, unspecified: Secondary | ICD-10-CM

## 2016-02-28 DIAGNOSIS — I2510 Atherosclerotic heart disease of native coronary artery without angina pectoris: Secondary | ICD-10-CM

## 2016-02-28 MED ORDER — METOPROLOL SUCCINATE ER 25 MG PO TB24: 25.0000 mg | ORAL_TABLET | Freq: Every day | ORAL | 6 refills | Status: DC

## 2016-02-28 MED ORDER — NITROGLYCERIN 0.4 MG SL SUBL: 0.4000 mg | SUBLINGUAL_TABLET | SUBLINGUAL | 3 refills | Status: DC | PRN

## 2016-02-28 MED ORDER — NITROGLYCERIN 0.3 MG SL SUBL: 0.3000 mg | SUBLINGUAL_TABLET | SUBLINGUAL | 3 refills | Status: DC | PRN

## 2016-02-28 NOTE — Patient Instructions (Signed)
Your physician wants you to follow-up in: 6 months with Dr. Domenic Polite. You will receive a reminder letter in the mail two months in advance. If you don't receive a letter, please call our office to schedule the follow-up appointment.    Your physician has recommended you make the following change in your medication:  Decrease Toprol XL to 37m daily   Thank you for choosing [facility]!

## 2016-02-28 NOTE — Telephone Encounter (Signed)
-----   Message from Satira Sark, MD sent at 02/28/2016 10:49 AM EST ----- Yes, that should be okay. Just make sure she knows.  ----- Message ----- From: Massie Maroon, CMA Sent: 02/28/2016  10:41 AM To: Satira Sark, MD  I refilled this pt NTG 0.3 mg that was already listed on her med list. Pharmacy doesn't have the 0.3 can we sent in SL NTG 0.4 mg ?  Wylee Dorantes

## 2016-02-28 NOTE — Progress Notes (Signed)
Cardiology Office Note  Date: 02/28/2016   ID: [name], [name] Apr 15, 1930, MRN [id number]  PCP: [name] Percy, MD  Primary Cardiologist: Rozann Lesches, MD   Chief Complaint  Patient presents with  . Coronary Artery Disease    History of Present Illness: [name] is an 83 y.o. female last seen in June 2017. She presents for a routine follow-up visit. Does not report any angina symptoms. Complains of a feeling of chronic fatigue. She still cooks and cleans her home, but is not as active as she once was. She is also limited by diffuse arthritic pains.  Records indicate ER visit back in October 2017 with complaints of dizziness and being off balance. Noted to be bradycardic in the 50s in the ER, no atrial fibrillation, troponin I negative.  I reviewed her medications which are outlined below. Current cardiac regimen is stable. We did discuss reducing her Toprol-XL to 25 mg once daily to see if she might notice less fatigue. She has been bradycardic. I reviewed her ECG today which shows sinus rhythm with prolonged PR interval and occasional PACs.  Past Medical History:  Diagnosis Date  . Arthritis   . Atrial flutter ([place])   . Coronary atherosclerosis of native coronary artery    BMS RCA May 2014 - [place]  . Depression   . Essential hypertension, benign   . Paroxysmal atrial fibrillation (H[facility])   . Secondary cardiomyopathy ([facility])    LVEF 35% - improved to 45-50%  . Ulcerative colitis     Past Surgical History:  Procedure Laterality Date  . ABDOMINAL HYSTERECTOMY    . APPENDECTOMY    . Bilateral knee replacements      2007, 2008  . BLADDER SURGERY    . COLONOSCOPY N/A 06/15/2015   Procedure: COLONOSCOPY;  Surgeon: Rogene Houston, MD;  [facility];  Service: Endoscopy;  Laterality: N/A;  210  . LEFT HEART CATHETERIZATION WITH CORONARY ANGIOGRAM N/A 10/30/2013   Procedure: LEFT HEART CATHETERIZATION WITH CORONARY ANGIOGRAM;  Surgeon: Burnell Blanks, MD;  Location: [facility] CATH LAB;  Service: Cardiovascular;  Laterality: N/A;  . TONSILLECTOMY    . TOTAL KNEE ARTHROPLASTY    . YAG LASER APPLICATION Bilateral 67/03/4191   Procedure: YAG LASER APPLICATION;  Surgeon: Williams Che, MD;  Location: [facility] ORS;  Service: Ophthalmology;  Laterality: Bilateral;    Current Outpatient Prescriptions  Medication Sig Dispense Refill  . acetaminophen (TYLENOL) 500 MG tablet Take 1,000 mg by mouth every 8 (eight) hours as needed for mild pain or headache.     Marland Kitchen aspirin 81 MG tablet Take 1 tablet (81 mg total) by mouth daily.    Marland Kitchen atorvastatin (LIPITOR) 20 MG tablet TAKE 1 TABLET BY MOUTH AT BEDTIME 30 tablet 6  . balsalazide (COLAZAL) 750 MG capsule TAKE 3 CAPSULES BY MOUTH THREE TIMES DAILY (REPLACING DELZICOL) 270 capsule 5  . ketotifen (THERA TEARS ALLERGY) 0.025 % ophthalmic solution Place 2 drops into both eyes as needed (for dry eyes).    Marland Kitchen losartan (COZAAR) 25 MG tablet TAKE 1 TABLET BY MOUTH EVERY DAY 90 tablet 3  . meclizine (ANTIVERT) 12.5 MG tablet Take 1 tablet (12.5 mg total) by mouth 3 (three) times daily as needed for dizziness. 15 tablet 0  . metoprolol succinate (TOPROL-XL) 25 MG 24 hr tablet TAKE 1 & 1/2 TABLETS BY MOUTH EVERY DAY 45 tablet 5  . Multiple Vitamins-Minerals (ICAPS AREDS 2 PO) Take by mouth.    Marland Kitchen  nitroGLYCERIN (NITROSTAT) 0.3 MG SL tablet Place 1 tablet (0.3 mg total) under the tongue every 5 (five) minutes x 3 doses as needed for chest pain. 6 tablet 3  . potassium chloride SA (K-DUR,KLOR-CON) 20 MEQ tablet TAKE 1/2 TABLET BY MOUTH DAILY. 45 tablet 3  . Rivaroxaban (XARELTO) 15 MG TABS tablet Take 1 tablet (15 mg total) by mouth daily. 30 tablet 5  . triamterene-hydrochlorothiazide (MAXZIDE-25) 37.5-25 MG tablet TAKE 1/2 OF A TABLET BY MOUTH EVERY DAY 15 tablet 6  . UNKNOWN TO PATIENT 2 eye vitamins capsules at night    . XARELTO 15 MG TABS tablet TAKE 1 TABLET BY MOUTH EVERY DAY 30 tablet 5   No current  facility-administered medications for this visit.    Allergies:  Sinus & allergy [chlorpheniramine-phenylephrine]; Demerol; and Penicillins   Social History: The patient  reports that she has never smoked. She has never used smokeless tobacco. She reports that she does not drink alcohol or use drugs.   ROS:  Please see the history of present illness. Otherwise, complete review of systems is positive for none.  All other systems are reviewed and negative.   Physical Exam: VS:  BP 128/70   Pulse 61   Ht 5' 3"  (1.6 m)   Wt 212 lb (96.2 kg)   SpO2 93%   BMI 37.55 kg/m , BMI Body mass index is 37.55 kg/m.  Wt Readings from Last 3 Encounters:  02/28/16 212 lb (96.2 kg)  01/12/16 205 lb (93 kg)  12/15/15 209 lb (94.8 kg)    Appears comfortable at rest. Overweight woman in no acute distress.  HEENT: Conjunctiva and lids normal, oropharynx with moist mucosa.  Neck: Supple, no elevated JVP or carotid bruits.  Lungs: Clear to auscultation, nonlabored.  Cardiac: RRR with ectopy, no S3 or rub.  Abdomen: Soft, nontender, bowel sounds present.  Extremities: Mild lymphedema. Skin: Warm and dry. Musculoskeletal: No kyphosis. Neuropsychiatric: Alert and oriented 3, affect appropriate.  ECG: I personally reviewed the tracing from 10/13/2015 which showed sinus rhythm with prolonged PR interval and left bundle branch block.  Recent Labwork: 10/13/2015: BUN 17; Creatinine, Ser 0.57; Hemoglobin 13.2; Platelets 285; Potassium 4.0; Sodium 136  Other Studies Reviewed Today:  Echocardiogram 09/16/2013: Moderate LVH with LVEF 45-50%, inferior and inferolateral hypokinesis, grade 1 diastolic dysfunction, mild to moderate left atrial enlargement, minor MAC with mild mitral regurgitation, mildly dilated aortic root, trivial tricuspid regurgitation, unable to assess PASP.   Cardiac catheterization 10/30/2013: Hemodynamic Findings: Central aortic pressure: 138/63 Left ventricular pressure:  136/10/17  Angiographic Findings:  Left main: 10% distal stenosis.   Left Anterior Descending Artery: Large caliber vessel that gives off a bifurcating moderate caliber septal perforating branch and then continuation to the apex with one moderate caliber diagonal branch. The proximal LAD has mild plaque. The mid LAD has diffuse 20% stenosis. The distal vessel has no obstructive disease. The diagonal branch is moderate in caliber with mild plaque.   Circumflex Artery: Moderate caliber vessel with 20% ostial stenosis, moderate caliber obtuse marginal branch with mild plaque. The intermediate branch is large in caliber with mild diffuse plaque.   Right Coronary Artery: Large dominant vessel. The proximal vessel has mild luminal irregularities. The mid stented segment is patent with mild, 20% diffuse stent restenosis. The distal vessel, PDA and PLA have mild luminal irregularities.   Assessment and Plan:  1. CAD status post BMS to the RCA in May 2014. Cardiac catheterization from October 2015 is outlined above. She does  not report any significant angina symptoms on medical therapy.  2. Fatigue, bradycardia also noted. We will reduce Toprol-XL dose to 66mg daily to see if this is helpful.  3. Paroxysmal atrial fibrillation and prior history of atrial flutter. She continues on Xarelto for stroke prophylaxis. Lab work followed by Dr. HNadara Mustard She denies any bleeding problems and also continues on low-dose aspirin.  4. History of cardiomyopathy, LVEF 45-50%.  Current medicines were reviewed with the patient today.   Orders Placed This Encounter  Procedures  . EKG 12-Lead    Disposition: Follow-up in 6 months.  Signed, SSatira Sark MD, [facility]2/20/2018 10:22 AM    [place] [address]Phone: [phone] Fax: [phone]

## 2016-03-13 DIAGNOSIS — R739 Hyperglycemia, unspecified: Secondary | ICD-10-CM | POA: Diagnosis not present

## 2016-03-13 DIAGNOSIS — R5383 Other fatigue: Secondary | ICD-10-CM | POA: Diagnosis not present

## 2016-03-13 DIAGNOSIS — I10 Essential (primary) hypertension: Secondary | ICD-10-CM | POA: Diagnosis not present

## 2016-03-21 ENCOUNTER — Other Ambulatory Visit: Payer: Self-pay | Admitting: Cardiology

## 2016-03-21 ENCOUNTER — Encounter (INDEPENDENT_AMBULATORY_CARE_PROVIDER_SITE_OTHER): Payer: Self-pay | Admitting: Internal Medicine

## 2016-03-26 DIAGNOSIS — Z1231 Encounter for screening mammogram for malignant neoplasm of breast: Secondary | ICD-10-CM | POA: Diagnosis not present

## 2016-04-20 ENCOUNTER — Other Ambulatory Visit: Payer: Self-pay | Admitting: Cardiology

## 2016-05-02 ENCOUNTER — Ambulatory Visit (INDEPENDENT_AMBULATORY_CARE_PROVIDER_SITE_OTHER): Payer: Medicare Other | Admitting: Internal Medicine

## 2016-05-02 ENCOUNTER — Encounter (INDEPENDENT_AMBULATORY_CARE_PROVIDER_SITE_OTHER): Payer: Self-pay | Admitting: Internal Medicine

## 2016-05-02 VITALS — BP 170/80 | HR 72 | Temp 98.0°F | Ht 63.0 in | Wt 226.3 lb

## 2016-05-02 DIAGNOSIS — K5100 Ulcerative (chronic) pancolitis without complications: Secondary | ICD-10-CM | POA: Diagnosis not present

## 2016-05-02 NOTE — Patient Instructions (Signed)
OV in 1 year.  

## 2016-05-02 NOTE — Progress Notes (Signed)
Subjective:    Patient ID: [name], female    DOB: February 22, 1930, 83 y.o.   MRN: [id number]  HPI  Here today for f/u. She was last seen in April of 2017. Hx of UC. Maintained on Balsalazide 750 TID for her UC. Diagnosed in 2007.  Flex sigmoid in 2007 revealed diffuse colitis up to mid transverse colon. Endoscopic and bx consistent with UC.  She tells me she does not feels well. No energy. In morning she says she aches in rt lower abdomen. As the day progresses, the pain lessens. Her appetite is good. No weight loss. She has a BM daily. No melena or BRRB.  Leans towards constipation.    06/15/2015 Colonoscopy: UC. Dr. Laural Golden: Impression:               - Two 4 to 7 mm polyps in the cecum, removed with a                            cold snare. Resected and retrieved. Clip (MR                            conditional) was placed.                           - Three small polyps in the sigmoid colon and at                            the splenic flexure. Biopsied.                           - The entire examined colon is normal on direct and                            retroflexion views.  Patient had 5 small polyps removed and they're tubular adenomas.      Colonoscopy was in 2010 and was normal except for single anal papilla and external hemorrhoids. UC remains in remission.  Hx of atrial fib and maintained on Xarelto and ASA.    CBC Latest Ref Rng & Units 10/13/2015 10/30/2013 10/03/2011  WBC 4.0 - 10.5 K/uL 7.3 7.3 8.9  Hemoglobin 12.0 - 15.0 g/dL 13.2 13.2 14.6  Hematocrit 36.0 - 46.0 % 39.2 38.1 42.8  Platelets 150 - 400 K/uL 285 258 379      Review of Systems Past Medical History:  Diagnosis Date  . Arthritis   . Atrial flutter ([place])   . Coronary atherosclerosis of native coronary artery    BMS RCA May 2014 - [place]  . Depression   . Essential hypertension, benign   . Paroxysmal atrial fibrillation ([facility])   . Secondary cardiomyopathy ([facility])    LVEF 35% - improved to  45-50%  . Ulcerative colitis     Past Surgical History:  Procedure Laterality Date  . ABDOMINAL HYSTERECTOMY    . APPENDECTOMY    . Bilateral knee replacements      2007, 2008  . BLADDER SURGERY    . COLONOSCOPY N/A 06/15/2015   Procedure: COLONOSCOPY;  Surgeon: Rogene Houston, MD;  [facility];  Service: Endoscopy;  Laterality: N/A;  210  . LEFT HEART CATHETERIZATION WITH CORONARY ANGIOGRAM N/A 10/30/2013  Procedure: LEFT HEART CATHETERIZATION WITH CORONARY ANGIOGRAM;  Surgeon: Burnell Blanks, MD;  Location: [facility] CATH LAB;  Service: Cardiovascular;  Laterality: N/A;  . TONSILLECTOMY    . TOTAL KNEE ARTHROPLASTY    . YAG LASER APPLICATION Bilateral 78/04/6960   Procedure: YAG LASER APPLICATION;  Surgeon: Williams Che, MD;  Location: [facility] ORS;  Service: Ophthalmology;  Laterality: Bilateral;    Allergies  Allergen Reactions  . Sinus & Allergy [Chlorpheniramine-Phenylephrine] Shortness Of Breath  . Demerol Rash  . Penicillins Rash and Other (See Comments)    REACTION: rash, years ago    Current Outpatient Prescriptions on File Prior to Visit  Medication Sig Dispense Refill  . aspirin 81 MG tablet Take 1 tablet (81 mg total) by mouth daily.    Marland Kitchen atorvastatin (LIPITOR) 20 MG tablet TAKE 1 TABLET BY MOUTH AT BEDTIME 30 tablet 6  . balsalazide (COLAZAL) 750 MG capsule TAKE 3 CAPSULES BY MOUTH THREE TIMES DAILY (REPLACING DELZICOL) 270 capsule 5  . ketotifen (THERA TEARS ALLERGY) 0.025 % ophthalmic solution Place 2 drops into both eyes as needed (for dry eyes).    Marland Kitchen losartan (COZAAR) 25 MG tablet TAKE 1 TABLET BY MOUTH EVERY DAY 90 tablet 3  . metoprolol succinate (TOPROL XL) 25 MG 24 hr tablet Take 1 tablet (25 mg total) by mouth daily. 30 tablet 6  . Multiple Vitamins-Minerals (ICAPS AREDS 2 PO) Take by mouth.    . nitroGLYCERIN (NITROSTAT) 0.4 MG SL tablet Place 1 tablet (0.4 mg total) under the tongue every 5 (five) minutes as needed for chest pain. 25 tablet 3    . potassium chloride SA (K-DUR,KLOR-CON) 20 MEQ tablet TAKE 1/2 TABLET BY MOUTH DAILY. 45 tablet 3  . triamterene-hydrochlorothiazide (MAXZIDE-25) 37.5-25 MG tablet TAKE 1/2 OF A TABLET BY MOUTH EVERY DAY 15 tablet 6  . UNKNOWN TO PATIENT 2 eye vitamins capsules at night    . XARELTO 15 MG TABS tablet TAKE 1 TABLET BY MOUTH EVERY DAY 30 tablet 5   No current facility-administered medications on file prior to visit.        Objective:   Physical Exam Blood pressure (!) 170/80, pulse 72, temperature 98 F (36.7 C), height 5' 3"  (1.6 m), weight 226 lb 4.8 oz (102.6 kg). Alert and oriented. Skin warm and dry. Oral mucosa is moist.   . Sclera anicteric, conjunctivae is pink. Thyroid not enlarged. No cervical lymphadenopathy. Lungs clear. Heart regular rate and rhythm.  Abdomen is soft. Bowel sounds are positive. No hepatomegaly. No abdominal masses felt. No tenderness.  No edema to lower extremities.   Stool brown and guaiac negative.  Hemocult CARD  95284 Ex 5/20 Developer  65987S 2020-10      Assessment & Plan:  UC. She seem seems to be in remission. Am going to get a CBC and CRP. OV in one year.

## 2016-05-03 LAB — CBC WITH DIFFERENTIAL/PLATELET
BASOS ABS: 81 {cells}/uL (ref 0–200)
BASOS PCT: 1 %
EOS PCT: 3 %
Eosinophils Absolute: 243 cells/uL (ref 15–500)
HCT: 40 % (ref 35.0–45.0)
HEMOGLOBIN: 13.2 g/dL (ref 11.7–15.5)
LYMPHS ABS: 2754 {cells}/uL (ref 850–3900)
Lymphocytes Relative: 34 %
MCH: 29.9 pg (ref 27.0–33.0)
MCHC: 33 g/dL (ref 32.0–36.0)
MCV: 90.7 fL (ref 80.0–100.0)
MONOS PCT: 9 %
MPV: 10.4 fL (ref 7.5–12.5)
Monocytes Absolute: 729 cells/uL (ref 200–950)
NEUTROS ABS: 4293 {cells}/uL (ref 1500–7800)
Neutrophils Relative %: 53 %
PLATELETS: 309 10*3/uL (ref 140–400)
RBC: 4.41 MIL/uL (ref 3.80–5.10)
RDW: 14.5 % (ref 11.0–15.0)
WBC: 8.1 10*3/uL (ref 3.8–10.8)

## 2016-05-03 LAB — URINALYSIS
Bilirubin Urine: NEGATIVE
GLUCOSE, UA: NEGATIVE
HGB URINE DIPSTICK: NEGATIVE
Ketones, ur: NEGATIVE
LEUKOCYTES UA: NEGATIVE
NITRITE: NEGATIVE
PROTEIN: NEGATIVE
Specific Gravity, Urine: 1.01 (ref 1.001–1.035)
pH: 7.5 (ref 5.0–8.0)

## 2016-05-03 LAB — SEDIMENTATION RATE: SED RATE: 16 mm/h (ref 0–30)

## 2016-05-04 DIAGNOSIS — R109 Unspecified abdominal pain: Secondary | ICD-10-CM | POA: Diagnosis not present

## 2016-05-04 DIAGNOSIS — M1990 Unspecified osteoarthritis, unspecified site: Secondary | ICD-10-CM | POA: Diagnosis not present

## 2016-05-04 DIAGNOSIS — Z79899 Other long term (current) drug therapy: Secondary | ICD-10-CM | POA: Diagnosis not present

## 2016-05-04 DIAGNOSIS — Z9889 Other specified postprocedural states: Secondary | ICD-10-CM | POA: Diagnosis not present

## 2016-05-04 DIAGNOSIS — Z955 Presence of coronary angioplasty implant and graft: Secondary | ICD-10-CM | POA: Diagnosis not present

## 2016-05-04 DIAGNOSIS — R1011 Right upper quadrant pain: Secondary | ICD-10-CM | POA: Diagnosis not present

## 2016-05-04 DIAGNOSIS — F329 Major depressive disorder, single episode, unspecified: Secondary | ICD-10-CM | POA: Diagnosis not present

## 2016-05-04 DIAGNOSIS — D494 Neoplasm of unspecified behavior of bladder: Secondary | ICD-10-CM | POA: Diagnosis not present

## 2016-05-04 DIAGNOSIS — Z7901 Long term (current) use of anticoagulants: Secondary | ICD-10-CM | POA: Diagnosis not present

## 2016-05-04 DIAGNOSIS — K8080 Other cholelithiasis without obstruction: Secondary | ICD-10-CM | POA: Diagnosis not present

## 2016-05-04 DIAGNOSIS — K8020 Calculus of gallbladder without cholecystitis without obstruction: Secondary | ICD-10-CM | POA: Diagnosis not present

## 2016-05-04 DIAGNOSIS — Z7982 Long term (current) use of aspirin: Secondary | ICD-10-CM | POA: Diagnosis not present

## 2016-05-04 DIAGNOSIS — M48061 Spinal stenosis, lumbar region without neurogenic claudication: Secondary | ICD-10-CM | POA: Diagnosis not present

## 2016-05-08 DIAGNOSIS — D494 Neoplasm of unspecified behavior of bladder: Secondary | ICD-10-CM | POA: Diagnosis not present

## 2016-05-09 ENCOUNTER — Other Ambulatory Visit: Payer: Self-pay | Admitting: Urology

## 2016-05-10 NOTE — Patient Instructions (Signed)
[name]  05/10/2016   Your procedure is scheduled on: 05/14/2016    Report to [facility] Main  Entrance Take [place]  elevators to 3rd floor to  [place] at  [place] AM.     Call this number if you have problems the morning of surgery [phone]    Remember: ONLY 1 PERSON MAY GO WITH YOU TO SHORT STAY TO GET  READY MORNING OF [name].  Do not eat food or drink liquids :After Midnight.     Take these medicines the morning of surgery with A SIP OF WATER: eye drops as usual, Metoprolol ( Toprol)                                You may not have any metal on your body including hair pins and              piercings  Do not wear jewelry,  lotions, powders or perfumes, deodorant                          Men may shave face and neck.   Do not bring valuables to the hospital. [name].  Contacts, dentures or bridgework may not be worn into surgery.      Patients discharged the day of surgery will not be allowed to drive home.  Name and phone number of your driver:               Please read over the following fact sheets you were given: _____________________________________________________________________             [facility] - Preparing for Surgery Before surgery, you can play an important role.  Because skin is not sterile, your skin needs to be as free of germs as possible.  You can reduce the number of germs on your skin by washing with CHG (chlorahexidine gluconate) soap before surgery.  CHG is an antiseptic cleaner which kills germs and bonds with the skin to continue killing germs even after washing. Please DO NOT use if you have an allergy to CHG or antibacterial soaps.  If your skin becomes reddened/irritated stop using the CHG and inform your nurse when you arrive at Short Stay. Do not shave (including legs and underarms) for at least 48 hours prior to the first CHG shower.  You may  shave your face/neck. Please follow these instructions carefully:  1.  Shower with CHG Soap the night before surgery and the  morning of Surgery.  2.  If you choose to wash your hair, wash your hair first as usual with your  normal  shampoo.  3.  After you shampoo, rinse your hair and body thoroughly to remove the  shampoo.                           4.  Use CHG as you would any other liquid soap.  You can apply chg directly  to the skin and wash                       Gently with a scrungie or clean washcloth.  5.  Apply the CHG Soap to your body ONLY FROM THE NECK DOWN.   Do not use on face/ open                           Wound or open sores. Avoid contact with eyes, ears mouth and genitals (private parts).                       Wash face,  Genitals (private parts) with your normal soap.             6.  Wash thoroughly, paying special attention to the area where your surgery  will be performed.  7.  Thoroughly rinse your body with warm water from the neck down.  8.  DO NOT shower/wash with your normal soap after using and rinsing off  the CHG Soap.                9.  Pat yourself dry with a clean towel.            10.  Wear clean pajamas.            11.  Place clean sheets on your bed the night of your first shower and do not  sleep with pets. Day of Surgery : Do not apply any lotions/deodorants the morning of surgery.  Please wear clean clothes to the hospital/surgery center.  FAILURE TO FOLLOW THESE INSTRUCTIONS MAY RESULT IN THE CANCELLATION OF YOUR SURGERY PATIENT SIGNATURE_________________________________  NURSE SIGNATURE__________________________________  ________________________________________________________________________

## 2016-05-11 ENCOUNTER — Encounter (HOSPITAL_COMMUNITY): Payer: Self-pay

## 2016-05-11 ENCOUNTER — Encounter (HOSPITAL_COMMUNITY)
Admission: RE | Admit: 2016-05-11 | Discharge: 2016-05-11 | Disposition: A | Discharge status: Home / Self Care | Payer: Medicare Other | Source: Ambulatory Visit | Attending: Urology | Admitting: Urology

## 2016-05-11 DIAGNOSIS — D494 Neoplasm of unspecified behavior of bladder: Secondary | ICD-10-CM | POA: Diagnosis present

## 2016-05-11 DIAGNOSIS — E7800 Pure hypercholesterolemia, unspecified: Secondary | ICD-10-CM | POA: Diagnosis not present

## 2016-05-11 DIAGNOSIS — Z01812 Encounter for preprocedural laboratory examination: Secondary | ICD-10-CM | POA: Diagnosis not present

## 2016-05-11 DIAGNOSIS — Z7901 Long term (current) use of anticoagulants: Secondary | ICD-10-CM | POA: Diagnosis not present

## 2016-05-11 DIAGNOSIS — Z79899 Other long term (current) drug therapy: Secondary | ICD-10-CM | POA: Diagnosis not present

## 2016-05-11 DIAGNOSIS — Z955 Presence of coronary angioplasty implant and graft: Secondary | ICD-10-CM | POA: Diagnosis not present

## 2016-05-11 DIAGNOSIS — C672 Malignant neoplasm of lateral wall of bladder: Secondary | ICD-10-CM | POA: Diagnosis not present

## 2016-05-11 DIAGNOSIS — I2510 Atherosclerotic heart disease of native coronary artery without angina pectoris: Secondary | ICD-10-CM | POA: Diagnosis not present

## 2016-05-11 DIAGNOSIS — Z880 Allergy status to penicillin: Secondary | ICD-10-CM | POA: Diagnosis not present

## 2016-05-11 DIAGNOSIS — Z7982 Long term (current) use of aspirin: Secondary | ICD-10-CM | POA: Diagnosis not present

## 2016-05-11 DIAGNOSIS — I10 Essential (primary) hypertension: Secondary | ICD-10-CM | POA: Diagnosis not present

## 2016-05-11 DIAGNOSIS — C674 Malignant neoplasm of posterior wall of bladder: Secondary | ICD-10-CM | POA: Diagnosis not present

## 2016-05-11 DIAGNOSIS — I4891 Unspecified atrial fibrillation: Secondary | ICD-10-CM | POA: Diagnosis not present

## 2016-05-11 HISTORY — DX: Unspecified macular degeneration: H35.30

## 2016-05-11 HISTORY — DX: Cardiac arrhythmia, unspecified: I49.9

## 2016-05-11 LAB — BASIC METABOLIC PANEL
ANION GAP: 10 (ref 5–15)
BUN: 22 mg/dL — ABNORMAL HIGH (ref 6–20)
CHLORIDE: 102 mmol/L (ref 101–111)
CO2: 24 mmol/L (ref 22–32)
CREATININE: 0.83 mg/dL (ref 0.44–1.00)
Calcium: 9.8 mg/dL (ref 8.9–10.3)
GFR calc non Af Amer: >60 mL/min (ref >60)
Glucose, Bld: 109 mg/dL — ABNORMAL HIGH (ref 65–99)
POTASSIUM: 4.6 mmol/L (ref 3.5–5.1)
SODIUM: 136 mmol/L (ref 135–145)

## 2016-05-11 LAB — CBC
HCT: 41.2 % (ref 36.0–46.0)
HEMOGLOBIN: 14 g/dL (ref 12.0–15.0)
MCH: 30 pg (ref 26.0–34.0)
MCHC: 34 g/dL (ref 30.0–36.0)
MCV: 88.2 fL (ref 78.0–100.0)
PLATELETS: 324 10*3/uL (ref 150–400)
RBC: 4.67 MIL/uL (ref 3.87–5.11)
RDW: 14.3 % (ref 11.5–15.5)
WBC: 9.8 10*3/uL (ref 4.0–10.5)

## 2016-05-11 NOTE — H&P (Signed)
Office Visit Report     05/08/2016   --------------------------------------------------------------------------------   [name]  MRN: [id number]  PRIMARY CARE:  Thomasene Lot. Nadara Mustard, MD  DOB: January 31, 1930, 83 year old Female  REFERRING:  Lennette Bihari P. Nadara Mustard, MD  SSN: -[id number]  PROVIDER:  Raynelle Bring, M.D.    LOCATION:  [facility] [phone]   --------------------------------------------------------------------------------   CC/HPI: Bladder tumor   [name] is an 83 year old female who is seen today at the request of Dr. Rory Percy for a bladder tumor. She denies a history of tobacco use and has denied any hematuria. She has had intermittent right-sided back and abdominal pain over the past few months. Recently, this is been increasing in severity. She has known spinal stenosis. Due to the increasing severity of her pain, she presented to the emergency department on 05/04/16. As part of her evaluation, she underwent a CT scan of the abdomen and pelvis with contrast. This did not demonstrate any hydronephrosis, ureteral obstruction, or urolithiasis. However, there was note of 2 hyperdense lesions within the bladder raising concern for bladder tumors. The largest measured 2.1 cm. No evidence of pelvic lymphadenopathy or other findings were apparent. She also had an ultrasound performed that day on her abdomen which also demonstrated no hydronephrosis or other concerning findings. She does have a history of a presumed urethral stricture and has undergone urethral dilation by her primary urologist in [place]. This was last performed over 20 years ago. She does at baseline have urinary frequency, urgency, nocturia, and some urge incontinence. She denies a history of urinary tract infections, STDs, urolithiasis, GU malignancy, trauma.   Notably, her pain symptoms had been completely positional and are improved when she is bending over. She does have reproducible pain symptoms  when going from a sitting to a standing position.   "She has undergone a hysterectomy in 1974 via midline incision. She also had a" bladder repair" at the same time. This is likely consistent with a cystocele.    Her medical comorbidities include a history of coronary artery disease status post cardiac stent. She also has a history of atrial fibrillation and is managed with metoprolol and Xarelto.     ALLERGIES: Chlorpheniramine Maleate CR CPCR Demerol Penicillin    MEDICATIONS: Aspirin  Metoprolol Succinate  Acetaminophen  Atorvastatin Calcium  Balsalazide Disodium  Hydrocodone-Acetaminophen 5 mg-325 mg tablet  Lidoderm 5 % adhesive patch, medicated  Losartan Potassium  Potassium Chloride  Triamterene-Hydrochlorothiazid 37.5 mg-25 mg tablet  Xarelto 15 mg tablet     GU PSH: Hysterectomy    NON-GU PSH: Appendectomy Cataract surgery, Bilateral Knee replacement, Bilateral Tonsillectomy    GU PMH: None   NON-GU PMH: Anxiety Arthritis Atrial Fibrillation Depression Heart disease, unspecified Hypercholesterolemia Hypertension    FAMILY HISTORY: 2 daughters - Runs in Family 1 son - Runs in Family Heart Attack - Father   SOCIAL HISTORY: Marital Status: Married Current Smoking Status: Patient has never smoked.   Tobacco Use Assessment Completed: Used Tobacco in last 30 days? Has never drank.  Drinks 1 caffeinated drink per day.    REVIEW OF SYSTEMS:    GU Review Female:   Patient reports frequent urination, hard to postpone urination, get up at night to urinate, and leakage of urine. Patient denies burning /pain with urination, stream starts and stops, trouble starting your stream, have to strain to urinate, and currently pregnant.  Gastrointestinal (Lower):   Patient denies diarrhea and constipation.  Gastrointestinal (Upper):   Patient  denies nausea and vomiting.  Constitutional:   Patient reports fatigue. Patient denies fever, night sweats, and weight loss.   Skin:   Patient denies skin rash/ lesion and itching.  Eyes:   Patient denies blurred vision and double vision.  Ears/ Nose/ Throat:   Patient denies sore throat and sinus problems.  Hematologic/Lymphatic:   Patient reports easy bruising. Patient denies swollen glands.  Cardiovascular:   Patient reports leg swelling. Patient denies chest pains.  Respiratory:   Patient denies cough and shortness of breath.  Endocrine:   Patient denies excessive thirst.  Musculoskeletal:   Patient reports back pain and joint pain.   Neurological:   Patient reports dizziness. Patient denies headaches.  Psychologic:   Patient reports anxiety. Patient denies depression.   VITAL SIGNS:      05/08/2016 10:50 AM  Weight 226 lb / 102.51 kg  Height 63 in / 160.02 cm  BP 129/63 mmHg  Pulse 59 /min  BMI 40.0 kg/m   MULTI-SYSTEM PHYSICAL EXAMINATION:    Constitutional: Well-nourished. No physical deformities. Normally developed. Good grooming.  Neck: Neck symmetrical, not swollen. Normal tracheal position.  Respiratory: No labored breathing, no use of accessory muscles.   Cardiovascular: Normal temperature, normal extremity pulses, no swelling, no varicosities. Irregular rhythm.  Lymphatic: No enlargement of neck, axillae, groin.  Skin: No paleness, no jaundice, no cyanosis. No lesion, no ulcer, no rash.  Neurologic / Psychiatric: Oriented to time, oriented to place, oriented to person. No depression, no anxiety, no agitation.  Gastrointestinal: No mass, no tenderness, no rigidity, non obese abdomen. She has a lower midline incision and a right lower quadrant incision.  Eyes: Normal conjunctivae. Normal eyelids.  Ears, Nose, Mouth, and Throat: Left ear no scars, no lesions, no masses. Right ear no scars, no lesions, no masses. Nose no scars, no lesions, no masses. Normal hearing. Normal lips.  Musculoskeletal: Normal gait and station of head and neck.     PAST DATA REVIEWED:  Source Of History:  Patient   Records Review:   Previous Patient Records  Urine Test Review:   Urinalysis  X-Ray Review: C.T. Abdomen/Pelvis: Reviewed Films. Findings are as dictated above.    PROCEDURES:         Flexible Cystoscopy - 52000  Risks, benefits, and some of the potential complications of the procedure were discussed at length with the patient including infection, bleeding, voiding discomfort, urinary retention, fever, chills, sepsis, and others. All questions were answered. Informed consent was obtained. Antibiotic prophylaxis was given. Sterile technique and intraurethral analgesia were used.  Meatus:  Normal size. Normal location. Normal condition.  Urethra:  No hypermobility. No leakage.  Ureteral Orifices:  Normal location. Normal size. Normal shape. Effluxed clear urine.  Bladder:  There are 2 papillary tumors noted. There is a 3 cm tumor located on the left lateral/posterior bladder wall and a second smaller 1.5 cm tumor located posteriorly more toward the midline. A bladder washing was obtained for cytology.      The lower urinary tract was carefully examined. The procedure was well-tolerated and without complications. Antibiotic instructions were given. Instructions were given to call the office immediately for bloody urine, difficulty urinating, urinary retention, painful or frequent urination, fever, chills, nausea, vomiting or other illness. The patient stated that she understood these instructions and would comply with them.         Urinalysis Dipstick Dipstick Cont'd  Color: Yellow Bilirubin: Neg  Appearance: Clear Ketones: Neg  Specific Gravity: 1.015 Blood: Neg  pH: 7.0 Protein: Neg  Glucose: Neg Urobilinogen: 0.2    Nitrites: Neg    Leukocyte Esterase: Neg    ASSESSMENT:      ICD-10 Details  1 GU:   Bladder, Neoplasm of Unspecified behavior - D49.4    PLAN:           Orders Labs Urine Cytology  Lab Notes: Washing          Schedule Return Visit/Planned Activity: Other See  Visit Notes             Note: Will call to schedule surgery          Document Letter(s):  Created for Patient: Clinical Summary         Notes:   1. Bladder tumors: We discussed the finding on cystoscopy today suggesting that she does have bladder cancer. We discussed the need to proceed with cystoscopy under anesthesia with transurethral resection of her bladder tumors. She also undergo bilateral retrograde pyelography since she did not have optimal evaluation of her upper tract urothelium on her prior CT scan. Considering the fact she at least has intermediate risk bladder cancer, we will await her pathology results and will not administer intravesical chemotherapy postoperatively. She likely will need at least induction BCG following treatment.   We discussed the natural history of bladder cancer today and she was provided educational information about the need to grade and stage her tumor. All questions were answered to her stated satisfaction and she was provided educational literature to go home with today.   She will proceed with cystoscopy, exam under anesthesia, bilateral retrograde pyelography, transurethral resection of bladder tumor. We have reviewed the potential risks and complications and expected recovery process of these procedures. She gives informed consent.   CC: Dr. Rory Percy    * Signed by Raynelle Bring, M.D. on 05/08/16 at 11:06 PM (EDT)*

## 2016-05-11 NOTE — Progress Notes (Signed)
lov Dr.Mcdowell2/20/18 epic ekg 02/28/16 epic Cbc 05/02/16 epic

## 2016-05-12 ENCOUNTER — Other Ambulatory Visit: Payer: Self-pay | Admitting: Cardiology

## 2016-05-14 ENCOUNTER — Ambulatory Visit (HOSPITAL_COMMUNITY): Payer: Medicare Other

## 2016-05-14 ENCOUNTER — Ambulatory Visit (HOSPITAL_COMMUNITY): Payer: Medicare Other | Admitting: Anesthesiology

## 2016-05-14 ENCOUNTER — Ambulatory Visit (HOSPITAL_COMMUNITY)
Admission: RE | Admit: 2016-05-14 | Discharge: 2016-05-14 | Disposition: A | Discharge status: Home / Self Care | Payer: Medicare Other | Source: Ambulatory Visit | Attending: Urology | Admitting: Urology

## 2016-05-14 ENCOUNTER — Encounter (HOSPITAL_COMMUNITY): Payer: Self-pay | Admitting: *Deleted

## 2016-05-14 ENCOUNTER — Encounter (HOSPITAL_COMMUNITY)
Admission: RE | Disposition: A | Discharge status: Home / Self Care | Payer: Self-pay | Source: Ambulatory Visit | Attending: Urology

## 2016-05-14 DIAGNOSIS — Z7901 Long term (current) use of anticoagulants: Secondary | ICD-10-CM | POA: Diagnosis not present

## 2016-05-14 DIAGNOSIS — I4891 Unspecified atrial fibrillation: Secondary | ICD-10-CM | POA: Insufficient documentation

## 2016-05-14 DIAGNOSIS — Z01812 Encounter for preprocedural laboratory examination: Secondary | ICD-10-CM | POA: Diagnosis not present

## 2016-05-14 DIAGNOSIS — Z7982 Long term (current) use of aspirin: Secondary | ICD-10-CM | POA: Insufficient documentation

## 2016-05-14 DIAGNOSIS — I2510 Atherosclerotic heart disease of native coronary artery without angina pectoris: Secondary | ICD-10-CM | POA: Diagnosis not present

## 2016-05-14 DIAGNOSIS — N3289 Other specified disorders of bladder: Secondary | ICD-10-CM | POA: Diagnosis not present

## 2016-05-14 DIAGNOSIS — C672 Malignant neoplasm of lateral wall of bladder: Secondary | ICD-10-CM | POA: Insufficient documentation

## 2016-05-14 DIAGNOSIS — C674 Malignant neoplasm of posterior wall of bladder: Secondary | ICD-10-CM | POA: Insufficient documentation

## 2016-05-14 DIAGNOSIS — Z79899 Other long term (current) drug therapy: Secondary | ICD-10-CM | POA: Insufficient documentation

## 2016-05-14 DIAGNOSIS — Z880 Allergy status to penicillin: Secondary | ICD-10-CM | POA: Diagnosis not present

## 2016-05-14 DIAGNOSIS — Z955 Presence of coronary angioplasty implant and graft: Secondary | ICD-10-CM | POA: Insufficient documentation

## 2016-05-14 DIAGNOSIS — D494 Neoplasm of unspecified behavior of bladder: Secondary | ICD-10-CM | POA: Diagnosis not present

## 2016-05-14 DIAGNOSIS — I10 Essential (primary) hypertension: Secondary | ICD-10-CM | POA: Insufficient documentation

## 2016-05-14 DIAGNOSIS — E7800 Pure hypercholesterolemia, unspecified: Secondary | ICD-10-CM | POA: Insufficient documentation

## 2016-05-14 DIAGNOSIS — I509 Heart failure, unspecified: Secondary | ICD-10-CM | POA: Diagnosis not present

## 2016-05-14 HISTORY — PX: TRANSURETHRAL RESECTION OF BLADDER TUMOR: SHX2575

## 2016-05-14 HISTORY — PX: CYSTOSCOPY W/ RETROGRADES: SHX1426

## 2016-05-14 SURGERY — TURBT (TRANSURETHRAL RESECTION OF BLADDER TUMOR)
Anesthesia: General

## 2016-05-14 MED ORDER — LACTATED RINGERS IV SOLN
INTRAVENOUS | Status: DC | PRN
  Administered 2016-05-14: 07:00:00 via INTRAVENOUS

## 2016-05-14 MED ORDER — PROPOFOL 10 MG/ML IV BOLUS
INTRAVENOUS | Status: AC
  Filled 2016-05-14: qty 20

## 2016-05-14 MED ORDER — FENTANYL CITRATE (PF) 100 MCG/2ML IJ SOLN
INTRAMUSCULAR | Status: DC | PRN
  Administered 2016-05-14: 100 ug via INTRAVENOUS

## 2016-05-14 MED ORDER — ONDANSETRON HCL 4 MG/2ML IJ SOLN
4.0000 mg | Freq: Once | INTRAMUSCULAR | Status: DC | PRN
Start: 2016-05-14 — End: 2016-05-14

## 2016-05-14 MED ORDER — CEFAZOLIN SODIUM-DEXTROSE 2-4 GM/100ML-% IV SOLN
2.0000 g | INTRAVENOUS | Status: AC
  Administered 2016-05-14: 2 g via INTRAVENOUS

## 2016-05-14 MED ORDER — FENTANYL CITRATE (PF) 100 MCG/2ML IJ SOLN
INTRAMUSCULAR | Status: AC
  Filled 2016-05-14: qty 2

## 2016-05-14 MED ORDER — IOHEXOL 300 MG/ML  SOLN
INTRAMUSCULAR | Status: DC | PRN
  Administered 2016-05-14: 18 mL

## 2016-05-14 MED ORDER — LIDOCAINE 2% (20 MG/ML) 5 ML SYRINGE
INTRAMUSCULAR | Status: AC
  Filled 2016-05-14: qty 5

## 2016-05-14 MED ORDER — SODIUM CHLORIDE 0.9 % IR SOLN
Status: DC | PRN
  Administered 2016-05-14: 6000 mL via INTRAVESICAL

## 2016-05-14 MED ORDER — PHENYLEPHRINE 40 MCG/ML (10ML) SYRINGE FOR IV PUSH (FOR BLOOD PRESSURE SUPPORT)
PREFILLED_SYRINGE | INTRAVENOUS | Status: AC
  Filled 2016-05-14: qty 10

## 2016-05-14 MED ORDER — SUGAMMADEX SODIUM 200 MG/2ML IV SOLN
INTRAVENOUS | Status: DC | PRN
  Administered 2016-05-14: 200 mg via INTRAVENOUS

## 2016-05-14 MED ORDER — CEFAZOLIN SODIUM-DEXTROSE 2-4 GM/100ML-% IV SOLN
INTRAVENOUS | Status: AC
  Filled 2016-05-14: qty 100

## 2016-05-14 MED ORDER — ONDANSETRON HCL 4 MG/2ML IJ SOLN
INTRAMUSCULAR | Status: AC
  Filled 2016-05-14: qty 2

## 2016-05-14 MED ORDER — ONDANSETRON HCL 4 MG/2ML IJ SOLN
INTRAMUSCULAR | Status: DC | PRN
  Administered 2016-05-14: 4 mg via INTRAVENOUS

## 2016-05-14 MED ORDER — STERILE WATER FOR IRRIGATION IR SOLN: Status: DC | PRN

## 2016-05-14 MED ORDER — ROCURONIUM BROMIDE 50 MG/5ML IV SOSY
PREFILLED_SYRINGE | INTRAVENOUS | Status: AC
  Filled 2016-05-14: qty 5

## 2016-05-14 MED ORDER — PHENAZOPYRIDINE HCL 100 MG PO TABS
100.0000 mg | ORAL_TABLET | Freq: Once | ORAL | Status: AC
  Administered 2016-05-14: 100 mg via ORAL
  Filled 2016-05-14: qty 1

## 2016-05-14 MED ORDER — PHENAZOPYRIDINE HCL 100 MG PO TABS: 100.0000 mg | ORAL_TABLET | Freq: Three times a day (TID) | ORAL | 0 refills | Status: DC | PRN

## 2016-05-14 MED ORDER — ROCURONIUM BROMIDE 10 MG/ML (PF) SYRINGE
PREFILLED_SYRINGE | INTRAVENOUS | Status: DC | PRN
  Administered 2016-05-14: 10 mg via INTRAVENOUS
  Administered 2016-05-14: 40 mg via INTRAVENOUS

## 2016-05-14 MED ORDER — 0.9 % SODIUM CHLORIDE (POUR BTL) OPTIME
TOPICAL | Status: DC | PRN
  Administered 2016-05-14: 1000 mL

## 2016-05-14 MED ORDER — PROPOFOL 10 MG/ML IV BOLUS
INTRAVENOUS | Status: DC | PRN
  Administered 2016-05-14: 100 mg via INTRAVENOUS

## 2016-05-14 MED ORDER — FENTANYL CITRATE (PF) 100 MCG/2ML IJ SOLN: 25.0000 ug | INTRAMUSCULAR | Status: DC | PRN

## 2016-05-14 MED ORDER — PHENYLEPHRINE 40 MCG/ML (10ML) SYRINGE FOR IV PUSH (FOR BLOOD PRESSURE SUPPORT)
PREFILLED_SYRINGE | INTRAVENOUS | Status: DC | PRN
Start: 2016-05-14 — End: 2016-05-14
  Administered 2016-05-14 (×2): 80 ug via INTRAVENOUS

## 2016-05-14 MED ORDER — LIDOCAINE 2% (20 MG/ML) 5 ML SYRINGE
INTRAMUSCULAR | Status: DC | PRN
  Administered 2016-05-14: 50 mg via INTRAVENOUS

## 2016-05-14 SURGICAL SUPPLY — 16 items
BAG URINE DRAINAGE (UROLOGICAL SUPPLIES) IMPLANT
BAG URO CATCHER STRL LF (MISCELLANEOUS) ×3 IMPLANT
CATH INTERMIT  6FR 70CM (CATHETERS) ×3 IMPLANT
CLOTH BEACON ORANGE TIMEOUT ST (SAFETY) ×3 IMPLANT
COVER SURGICAL LIGHT HANDLE (MISCELLANEOUS) ×2 IMPLANT
ELECT REM PT RETURN 15FT ADLT (MISCELLANEOUS) ×2 IMPLANT
GLOVE BIOGEL M STRL SZ7.5 (GLOVE) ×3 IMPLANT
GOWN STRL REUS W/TWL LRG LVL3 (GOWN DISPOSABLE) ×6 IMPLANT
GUIDEWIRE STR DUAL SENSOR (WIRE) ×3 IMPLANT
LOOP CUT BIPOLAR 24F LRG (ELECTROSURGICAL) ×1 IMPLANT
MANIFOLD NEPTUNE II (INSTRUMENTS) ×3 IMPLANT
PACK CYSTO (CUSTOM PROCEDURE TRAY) ×3 IMPLANT
SET ASPIRATION TUBING (TUBING) IMPLANT
SYRINGE IRR TOOMEY STRL 70CC (SYRINGE) ×1 IMPLANT
TUBING CONNECTING 10 (TUBING) ×3 IMPLANT
WATER STERILE IRR 500ML POUR (IV SOLUTION) ×1 IMPLANT

## 2016-05-14 NOTE — Transfer of Care (Signed)
Immediate Anesthesia Transfer of Care Note  Patient: [name]  Procedure(s) Performed: Procedure(s) with comments: TRANSURETHRAL RESECTION OF BLADDER TUMOR (TURBT) (N/A) - GENERAL ANESTHESIA WITH PARALYSIS CYSTOSCOPY WITH BILATERAL RETROGRADE PYELOGRAM (Bilateral) - GENERAL ANESTHESIA WITH PARALYSIS  Patient Location: PACU  Anesthesia Type:General  Level of Consciousness: awake, alert  and oriented  Airway & Oxygen Therapy: Patient Spontanous Breathing and Patient connected to face mask oxygen  Post-op Assessment: Report given to RN and Post -op Vital signs reviewed and stable  Post vital signs: Reviewed and stable  Last Vitals:  Vitals:   05/14/16 0516  BP: (!) 158/73  Pulse: 64  Resp: 16  Temp: 37.2 C    Last Pain:  Vitals:   05/14/16 0516  TempSrc: Oral         Complications: No apparent anesthesia complications

## 2016-05-14 NOTE — Anesthesia Preprocedure Evaluation (Addendum)
Anesthesia Evaluation  Patient identified by MRN, date of birth, ID band Patient awake    Reviewed: Allergy & Precautions, NPO status , Patient's Chart, lab work & pertinent test results, reviewed documented beta blocker date and time   Airway Mallampati: I  TM Distance: >3 FB Neck ROM: Full    Dental  (+) Dental Advisory Given, Edentulous Upper, Edentulous Lower   Pulmonary neg pulmonary ROS,    Pulmonary exam normal breath sounds clear to auscultation       Cardiovascular hypertension, Pt. on home beta blockers and Pt. on medications + CAD, + Cardiac Stents (BMS to RCA) and +CHF  (-) Past MI + dysrhythmias Atrial Fibrillation  Rhythm:Irregular Rate:Normal     Neuro/Psych PSYCHIATRIC DISORDERS Depression negative neurological ROS     GI/Hepatic negative GI ROS, Neg liver ROS,   Endo/Other  negative endocrine ROS  Renal/GU negative Renal ROS     Musculoskeletal  (+) Arthritis , Osteoarthritis,    Abdominal   Peds  Hematology  (+) Blood dyscrasia (Xarelto use), ,   Anesthesia Other Findings Day of surgery medications reviewed with the patient.  Reproductive/Obstetrics                             Anesthesia Physical Anesthesia Plan  ASA: III  Anesthesia Plan: General   Post-op Pain Management:    Induction: Intravenous  Airway Management Planned: Oral ETT  Additional Equipment:   Intra-op Plan:   Post-operative Plan: Extubation in OR  Informed Consent: I have reviewed the patients History and Physical, chart, labs and discussed the procedure including the risks, benefits and alternatives for the proposed anesthesia with the patient or authorized representative who has indicated his/her understanding and acceptance.   Dental advisory given  Plan Discussed with: CRNA  Anesthesia Plan Comments: (Risks/benefits of general anesthesia discussed with patient including risk of damage  to teeth, lips, gum, and tongue, nausea/vomiting, allergic reactions to medications, and the possibility of heart attack, stroke and death.  All patient questions answered.  Patient wishes to proceed.)        Anesthesia Quick Evaluation

## 2016-05-14 NOTE — Op Note (Signed)
Preoperative diagnosis: 1. Bladder tumor (3 cm and 1 cm)  Postoperative diagnosis:  1. Bladder tumor (3 cm and 1 cm)  Procedure:  1. Cystoscopy 2. Transurethral resection of bladder tumors 3. Bilateral retrograde pyelography with interpretation 4. Pelvic exam under anesthesia  Surgeon: Pryor Curia. M.D.  Anesthesia: General  Complications: None  Intraoperative findings:  1. Bladder tumor: There was a 3 cm left lateral bladder wall tumor and a 1 cm posterior bladder tumor. 2. Retrograde pyelography: Bilateral retrograde pyelography was performed with 6 Fr ureteral catheter and omnipaque contrast.  There were no filling defects or other abnormalities of the ureters bilaterally or the renal collecting systems bilaterally.  EBL: Minimal  Specimens: 1. Left lateral bladder wall tumor 2. Posterior bladder tumor 3. Deep biopsies of left lateral bladder tumor 4. Deep biopsies of posterior bladder tumor  Disposition of specimens: Pathology  Indication: [name] is a patient who was found to have two bladder tumors incidentally on imaging. After reviewing the management options for treatment, she elected to proceed with the above surgical procedure(s). We have discussed the potential benefits and risks of the procedure, side effects of the proposed treatment, the likelihood of the patient achieving the goals of the procedure, and any potential problems that might occur during the procedure or recuperation. Informed consent has been obtained.  Description of procedure:  The patient was taken to the operating room and general anesthesia was induced.  The patient was placed in the dorsal lithotomy position, prepped and draped in the usual sterile fashion, and preoperative antibiotics were administered. A preoperative time-out was performed.   Cystourethroscopy was performed.  The patient's urethra was examined and was normal.   The bladder was then systematically  examined in its entirety.  There was a 3 cm papillary tumor on the lateral left bladder wall about 1-2 cm away from the left ureteral orifice.  Attention then turned to the left ureteral orifice and a ureteral catheter was used to intubate the ureteral orifice.  Omnipaque contrast was injected through the ureteral catheter and a retrograde pyelogram was performed with findings as dictated above.  Attention then turned to the right ureteral orifice and a ureteral catheter was used to intubate the ureteral orifice.  Omnipaque contrast was injected through the ureteral catheter and a retrograde pyelogram was performed with findings as dictated above.  The bladder was then re-examined after the resectoscope was placed. Using loop cautery resection, each tumor was resected completely and removed for permanent pathologic analysis.   Separate biopsies were also taken of the underlying deep bladder tissue and sent as a separate specimen.   Hemostasis was then achieved with the loop cautery and the bladder was emptied and reinspected with no further bleeding noted at the end of the procedure.   A pelvic exam under anesthesia was unremarkable without any pelvic masses.  She does have vaginal prolapse with a cystocele extending beyond the introitus.   The bladder was then emptied and the procedure ended.  The patient appeared to tolerate the procedure well and without complications.  The patient was able to be awakened and transferred to the recovery unit in satisfactory condition.    Pryor Curia MD

## 2016-05-14 NOTE — Interval H&P Note (Signed)
History and Physical Interval Note:  05/14/2016 6:51 AM  [name]  has presented today for surgery, with the diagnosis of BLADDER TUMORS  The various methods of treatment have been discussed with the patient and family. After consideration of risks, benefits and other options for treatment, the patient has consented to  Procedure(s) with comments: TRANSURETHRAL RESECTION OF BLADDER TUMOR (TURBT) (N/A) - GENERAL ANESTHESIA WITH PARALYSIS CYSTOSCOPY WITH RETROGRADE PYELOGRAM (N/A) - GENERAL ANESTHESIA WITH PARALYSIS as a surgical intervention .  The patient's history has been reviewed, patient examined, no change in status, stable for surgery.  I have reviewed the patient's chart and labs.  Questions were answered to the patient's satisfaction.     Cortavius Montesinos,LES

## 2016-05-14 NOTE — Discharge Instructions (Addendum)
1. You may see some blood in the urine and may have some burning with urination for 48-72 hours. You also may notice that you have to urinate more frequently or urgently after your procedure which is normal.  2. You should call should you develop an inability urinate, fever > 101, persistent nausea and vomiting that prevents you from eating or drinking to stay hydrated.        3.   YOU MAY RESUME YOUR [place] ON THURSDAY IF YOUR URINE IS CLEAR.

## 2016-05-14 NOTE — Anesthesia Procedure Notes (Signed)
Procedure Name: Intubation Date/Time: 05/14/2016 7:23 AM Performed by: Danley Danker L Patient Re-evaluated:Patient Re-evaluated prior to inductionOxygen Delivery Method: Circle system utilized Preoxygenation: Pre-oxygenation with 100% oxygen Intubation Type: IV induction Ventilation: Mask ventilation without difficulty and Oral airway inserted - appropriate to patient size Laryngoscope Size: Sabra Heck and 2 Grade View: Grade I Tube type: Oral Tube size: 7.5 mm Number of attempts: 1 Airway Equipment and Method: Stylet Placement Confirmation: ETT inserted through vocal cords under direct vision,  positive ETCO2 and breath sounds checked- equal and bilateral Secured at: 21 cm Tube secured with: Tape Dental Injury: Teeth and Oropharynx as per pre-operative assessment

## 2016-05-14 NOTE — Anesthesia Postprocedure Evaluation (Addendum)
Anesthesia Post Note  Patient: [name]  Procedure(s) Performed: Procedure(s) (LRB): TRANSURETHRAL RESECTION OF BLADDER TUMOR (TURBT) (N/A) CYSTOSCOPY WITH BILATERAL RETROGRADE PYELOGRAM (Bilateral)  Patient location during evaluation: PACU Anesthesia Type: General Level of consciousness: awake, awake and alert and oriented Pain management: pain level controlled Respiratory status: spontaneous breathing, nonlabored ventilation and respiratory function stable Cardiovascular status: blood pressure returned to baseline Anesthetic complications: no       Last Vitals:  Vitals:   05/14/16 0934 05/14/16 1103  BP: (!) 153/77 120/62  Pulse: 60 69  Resp: 16 16  Temp: 36.7 C     Last Pain:  Vitals:   05/14/16 0915  TempSrc:   PainSc: 0-No pain                 Nance Mccombs COKER

## 2016-05-15 ENCOUNTER — Encounter (HOSPITAL_COMMUNITY): Payer: Self-pay | Admitting: Urology

## 2016-05-19 ENCOUNTER — Emergency Department (HOSPITAL_COMMUNITY)
Admission: EM | Admit: 2016-05-19 | Discharge: 2016-05-19 | Disposition: A | Discharge status: Home / Self Care | Payer: Medicare Other | Attending: Emergency Medicine | Admitting: Emergency Medicine

## 2016-05-19 ENCOUNTER — Encounter (HOSPITAL_COMMUNITY): Payer: Self-pay

## 2016-05-19 DIAGNOSIS — Z79899 Other long term (current) drug therapy: Secondary | ICD-10-CM | POA: Diagnosis not present

## 2016-05-19 DIAGNOSIS — I2510 Atherosclerotic heart disease of native coronary artery without angina pectoris: Secondary | ICD-10-CM | POA: Insufficient documentation

## 2016-05-19 DIAGNOSIS — R310 Gross hematuria: Secondary | ICD-10-CM | POA: Insufficient documentation

## 2016-05-19 DIAGNOSIS — I10 Essential (primary) hypertension: Secondary | ICD-10-CM | POA: Diagnosis not present

## 2016-05-19 DIAGNOSIS — R319 Hematuria, unspecified: Secondary | ICD-10-CM | POA: Diagnosis present

## 2016-05-19 DIAGNOSIS — Z7982 Long term (current) use of aspirin: Secondary | ICD-10-CM | POA: Diagnosis not present

## 2016-05-19 LAB — CBC WITH DIFFERENTIAL/PLATELET
BASOS PCT: 0 %
Basophils Absolute: 0 10*3/uL (ref 0.0–0.1)
EOS ABS: 0.2 10*3/uL (ref 0.0–0.7)
EOS PCT: 2 %
HCT: 36.6 % (ref 36.0–46.0)
Hemoglobin: 12.3 g/dL (ref 12.0–15.0)
LYMPHS ABS: 2 10*3/uL (ref 0.7–4.0)
Lymphocytes Relative: 26 %
MCH: 29.9 pg (ref 26.0–34.0)
MCHC: 33.6 g/dL (ref 30.0–36.0)
MCV: 88.8 fL (ref 78.0–100.0)
MONOS PCT: 9 %
Monocytes Absolute: 0.7 10*3/uL (ref 0.1–1.0)
Neutro Abs: 4.8 10*3/uL (ref 1.7–7.7)
Neutrophils Relative %: 63 %
PLATELETS: 312 10*3/uL (ref 150–400)
RBC: 4.12 MIL/uL (ref 3.87–5.11)
RDW: 14.2 % (ref 11.5–15.5)
WBC: 7.7 10*3/uL (ref 4.0–10.5)

## 2016-05-19 LAB — URINALYSIS, MICROSCOPIC (REFLEX)
BACTERIA UA: NONE SEEN
Squamous Epithelial / LPF: NONE SEEN

## 2016-05-19 LAB — CBC
HCT: 36 % (ref 36.0–46.0)
Hemoglobin: 12.1 g/dL (ref 12.0–15.0)
MCH: 29.9 pg (ref 26.0–34.0)
MCHC: 33.6 g/dL (ref 30.0–36.0)
MCV: 88.9 fL (ref 78.0–100.0)
PLATELETS: 312 10*3/uL (ref 150–400)
RBC: 4.05 MIL/uL (ref 3.87–5.11)
RDW: 14.2 % (ref 11.5–15.5)
WBC: 7.7 10*3/uL (ref 4.0–10.5)

## 2016-05-19 LAB — BASIC METABOLIC PANEL
Anion gap: 9 (ref 5–15)
BUN: 13 mg/dL (ref 6–20)
CALCIUM: 9.3 mg/dL (ref 8.9–10.3)
CHLORIDE: 102 mmol/L (ref 101–111)
CO2: 23 mmol/L (ref 22–32)
CREATININE: 0.62 mg/dL (ref 0.44–1.00)
GFR calc Af Amer: >60 mL/min (ref >60)
Glucose, Bld: 110 mg/dL — ABNORMAL HIGH (ref 65–99)
Potassium: 3.8 mmol/L (ref 3.5–5.1)
SODIUM: 134 mmol/L — AB (ref 135–145)

## 2016-05-19 LAB — URINALYSIS, ROUTINE W REFLEX MICROSCOPIC

## 2016-05-19 MED ORDER — TETANUS-DIPHTH-ACELL PERTUSSIS 5-2.5-18.5 LF-MCG/0.5 IM SUSP: 0.5000 mL | Freq: Once | INTRAMUSCULAR | Status: DC

## 2016-05-19 NOTE — ED Notes (Signed)
Pt bed linens changed, diaper applied, skin cleaned and dried.

## 2016-05-19 NOTE — ED Notes (Signed)
Pt given meal tray.

## 2016-05-19 NOTE — ED Provider Notes (Signed)
[facility] DEPT Provider Note   CSN: [id number] Arrival date & time: 05/19/16  1258     History   Chief Complaint Chief Complaint  Patient presents with  . bleeding postop    HPI [name] is a 83 y.o. female.  HPI  Pt was seen at 1310. Per pt and her family, c/o gradual onset and persistence of constant hematuria for the past 2 days. Pt states she is s/p bladder tumor removal on 05/14/16. States she was having "normal urine" after the procedure. Pt states she re-started taking her xarelto 2 days ago. States yesterday after taking her xarelo she started to have hematuria. Pt has taken a total of 2 doses of xarelto. Pt states she called her Uro MD and was told "the clot probably fell off" the surgical site and "to stay hydrated." Pt states she came to the ED for evaluation instead. Denies abd pain, no dysuria, no N/V/D, no back pain, no fevers, no other areas of bleeding.    Past Medical History:  Diagnosis Date  . Arthritis   . Atrial flutter ([place])   . Coronary atherosclerosis of native coronary artery    BMS RCA May 2014 - [place] stent  . Depression    hx of  . Dysrhythmia   . Essential hypertension, benign   . Macular degeneration   . Paroxysmal atrial fibrillation (HCC)   . Secondary cardiomyopathy (HCC)    LVEF 35% - improved to 45-50%  . Ulcerative colitis     Patient Active Problem List   Diagnosis Date Noted  . Arthritis of midfoot 01/13/2016  . Right shoulder pain 01/13/2016  . Ulcerative colitis ([place]) 05/03/2014  . Bilateral leg edema 11/30/2013  . Abnormal myocardial perfusion study 09/17/2013  . Coronary atherosclerosis of native coronary artery 09/03/2012  . Secondary cardiomyopathy ([place]) 09/03/2012  . Paroxysmal atrial fibrillation ([place]) 03/12/2012  . Mixed hyperlipidemia 01/21/2009  . Essential hypertension, benign 01/21/2009  . ATRIAL FLUTTER 10/06/2008    Past Surgical History:  Procedure Laterality Date  . ABDOMINAL HYSTERECTOMY      . APPENDECTOMY    . Bilateral knee replacements      2007, 2008  . BLADDER SURGERY    . COLONOSCOPY N/A 06/15/2015   Procedure: COLONOSCOPY;  Surgeon: Rogene Houston, MD;  Location: [facility] SUITE;  Service: Endoscopy;  Laterality: N/A;  210  . CYSTOSCOPY W/ RETROGRADES Bilateral 05/14/2016   Procedure: CYSTOSCOPY WITH BILATERAL RETROGRADE PYELOGRAM;  Surgeon: Raynelle Bring, MD;  Location: [facility] ORS;  Service: Urology;  Laterality: Bilateral;  GENERAL ANESTHESIA WITH PARALYSIS  . LEFT HEART CATHETERIZATION WITH CORONARY ANGIOGRAM N/A 10/30/2013   Procedure: LEFT HEART CATHETERIZATION WITH CORONARY ANGIOGRAM;  Surgeon: Burnell Blanks, MD;  Location[facility] CATH LAB;  Service: Cardiovascular;  Laterality: N/A;  . TONSILLECTOMY    . TOTAL KNEE ARTHROPLASTY    . TRANSURETHRAL RESECTION OF BLADDER TUMOR N/A 05/14/2016   Procedure: TRANSURETHRAL RESECTION OF BLADDER TUMOR (TURBT);  Surgeon: Raynelle Bring, MD;  Location: [facility] ORS;  Service: Urology;  Laterality: N/A;  GENERAL ANESTHESIA WITH PARALYSIS  . YAG LASER APPLICATION Bilateral 09/10/2353   Procedure: YAG LASER APPLICATION;  Surgeon: Williams Che, MD;  Location: [facility] ORS;  Service: Ophthalmology;  Laterality: Bilateral;    OB History    No data available       Home Medications    Prior to Admission medications   Medication Sig Start Date End Date Taking? Authorizing Provider  acetaminophen (TYLENOL ARTHRITIS PAIN) 650  MG CR tablet Take 1,300 mg by mouth every 8 (eight) hours.     [provider]  aspirin 81 MG tablet Take 1 tablet (81 mg total) by mouth daily. 06/17/15   Rogene Houston, MD  atorvastatin (LIPITOR) 20 MG tablet TAKE 1 TABLET BY MOUTH AT BEDTIME 03/21/16   Satira Sark, MD  balsalazide (COLAZAL) 750 MG capsule TAKE 3 CAPSULES BY MOUTH THREE TIMES DAILY (REPLACING DELZICOL) 02/20/16   Setzer, Rona Ravens, NP  HYDROcodone-acetaminophen (NORCO/VICODIN) 5-325 MG tablet Take 1 tablet by mouth every 4 (four) hours as  needed for pain. for pain 05/04/16   [provider]  ketotifen [facility]) 0.025 % ophthalmic solution Place 2 drops into both eyes 3 (three) times daily as needed (for dry eyes).     [provider]  lidocaine (LIDODERM) 5 % Place 1 patch onto the skin daily as needed (severe pain). Remove & Discard patch within 12 hours or as directed by MD    [provider]  losartan (COZAAR) 25 MG tablet TAKE 1 TABLET BY MOUTH EVERY DAY 02/20/16   Satira Sark, MD  metoprolol succinate (TOPROL XL) 25 MG 24 hr tablet Take 1 tablet (25 mg total) by mouth daily. 02/28/16   Satira Sark, MD  Multiple Vitamins-Minerals (ICAPS AREDS 2 PO) Take 2 tablets by mouth at bedtime.     [provider]  nitroGLYCERIN (NITROSTAT) 0.4 MG SL tablet Place 1 tablet (0.4 mg total) under the tongue every 5 (five) minutes as needed for chest pain. 02/28/16 05/28/16  Satira Sark, MD  phenazopyridine (PYRIDIUM) 100 MG tablet Take 1 tablet (100 mg total) by mouth 3 (three) times daily as needed for pain (for burning). 05/14/16   Raynelle Bring, MD  potassium chloride SA (K-DUR,KLOR-CON) 20 MEQ tablet TAKE 1/2 TABLET BY MOUTH DAILY. 02/20/16   Satira Sark, MD  triamterene-hydrochlorothiazide [facility]) 37.5-25 MG tablet TAKE 1/2 TABLET BY MOUTH DAILY 05/14/16   Satira Sark, MD  Turmeric 500 MG CAPS Take 500 mg by mouth daily.     [provider]    Family History No family history on file.  Social History Social History  Substance Use Topics  . Smoking status: Never Smoker  . Smokeless tobacco: Never Used  . Alcohol use No     Allergies   Sinus & allergy [chlorpheniramine-phenylephrine]; Demerol; and Penicillins   Review of Systems Review of Systems ROS: Statement: All systems negative except as marked or noted in the HPI; Constitutional: Negative for fever and chills. ; ; Eyes: Negative for eye pain, redness and discharge. ; ; ENMT: Negative  for ear pain, hoarseness, nasal congestion, sinus pressure and sore throat. ; ; Cardiovascular: Negative for chest pain, palpitations, diaphoresis, dyspnea and peripheral edema. ; ; Respiratory: Negative for cough, wheezing and stridor. ; ; Gastrointestinal: Negative for nausea, vomiting, diarrhea, abdominal pain, blood in stool, hematemesis, jaundice and rectal bleeding. . ; ; Genitourinary: Negative for dysuria, flank pain and +hematuria. ; ; Musculoskeletal: Negative for back pain and neck pain. Negative for swelling and trauma.; ; Skin: Negative for pruritus, rash, abrasions, blisters, bruising and skin lesion.; ; Neuro: Negative for headache, lightheadedness and neck stiffness. Negative for weakness, altered level of consciousness, altered mental status, extremity weakness, paresthesias, involuntary movement, seizure and syncope.       Physical Exam Updated Vital Signs BP (!) 149/89 (BP Location: Right Arm)   Pulse 70   Temp 98.4 F (36.9 C) (Oral)  Resp 20   Ht 5' 3"  (1.6 m)   Wt 223 lb (101.2 kg)   SpO2 96%   BMI 39.50 kg/m   Physical Exam 1315: Physical examination:  Nursing notes reviewed; Vital signs and O2 SAT reviewed;  Constitutional: Well developed, Well nourished, Well hydrated, In no acute distress; Head:  Normocephalic, atraumatic; Eyes: EOMI, PERRL, No scleral icterus; ENMT: Mouth and pharynx normal, Mucous membranes moist; Neck: Supple, Full range of motion, No lymphadenopathy; Cardiovascular: Regular rate and rhythm, No gallop; Respiratory: Breath sounds clear & equal bilaterally, No wheezes.  Speaking full sentences with ease, Normal respiratory effort/excursion; Chest: Nontender, Movement normal; Abdomen: Soft, Nontender, Nondistended, Normal bowel sounds; Genitourinary: No CVA tenderness; Extremities: Pulses normal, No tenderness, No edema, No calf edema or asymmetry.; Neuro: AA&Ox3, Major CN grossly intact.  Speech clear. No gross focal motor or sensory deficits in  extremities.; Skin: Color normal, Warm, Dry.   ED Treatments / Results  Labs (all labs ordered are listed, but only abnormal results are displayed)   EKG  EKG Interpretation None       Radiology   Procedures Procedures (including critical care time)  Medications Ordered in ED Medications - No data to display   Initial Impression / Assessment and Plan / ED Course  I have reviewed the triage vital signs and the nursing notes.  Pertinent labs & imaging results that were available during my care of the patient were reviewed by me and considered in my medical decision making (see chart for details).  MDM Reviewed: previous chart and nursing note Reviewed previous: labs Interpretation: labs   Results for orders placed or performed during the hospital encounter of 05/19/16  Urinalysis, Routine w reflex microscopic  Result Value Ref Range   Color, Urine RED (A) YELLOW   APPearance TURBID (A) CLEAR   Specific Gravity, Urine  1.005 - 1.030    TEST NOT REPORTED DUE TO COLOR INTERFERENCE OF URINE PIGMENT   pH  5.0 - 8.0    TEST NOT REPORTED DUE TO COLOR INTERFERENCE OF URINE PIGMENT   Glucose, UA (A) NEGATIVE mg/dL    TEST NOT REPORTED DUE TO COLOR INTERFERENCE OF URINE PIGMENT   Hgb urine dipstick (A) NEGATIVE    TEST NOT REPORTED DUE TO COLOR INTERFERENCE OF URINE PIGMENT   Bilirubin Urine (A) NEGATIVE    TEST NOT REPORTED DUE TO COLOR INTERFERENCE OF URINE PIGMENT   Ketones, ur (A) NEGATIVE mg/dL    TEST NOT REPORTED DUE TO COLOR INTERFERENCE OF URINE PIGMENT   Protein, ur (A) NEGATIVE mg/dL    TEST NOT REPORTED DUE TO COLOR INTERFERENCE OF URINE PIGMENT   Nitrite (A) NEGATIVE    TEST NOT REPORTED DUE TO COLOR INTERFERENCE OF URINE PIGMENT   Leukocytes, UA (A) NEGATIVE    TEST NOT REPORTED DUE TO COLOR INTERFERENCE OF URINE PIGMENT  Basic metabolic panel  Result Value Ref Range   Sodium 134 (L) 135 - 145 mmol/L   Potassium 3.8 3.5 - 5.1 mmol/L   Chloride 102 101  - 111 mmol/L   CO2 23 22 - 32 mmol/L   Glucose, Bld 110 (H) 65 - 99 mg/dL   BUN 13 6 - 20 mg/dL   Creatinine, Ser 0.62 0.44 - 1.00 mg/dL   Calcium 9.3 8.9 - 10.3 mg/dL   GFR calc non Af Amer >60 >60 mL/min   GFR calc Af Amer >60 >60 mL/min   Anion gap 9 5 - 15  CBC with Differential  Result Value Ref Range   WBC 7.7 4.0 - 10.5 K/uL   RBC 4.12 3.87 - 5.11 MIL/uL   Hemoglobin 12.3 12.0 - 15.0 g/dL   HCT 36.6 36.0 - 46.0 %   MCV 88.8 78.0 - 100.0 fL   MCH 29.9 26.0 - 34.0 pg   MCHC 33.6 30.0 - 36.0 g/dL   RDW 14.2 11.5 - 15.5 %   Platelets 312 150 - 400 K/uL   Neutrophils Relative % 63 %   Neutro Abs 4.8 1.7 - 7.7 K/uL   Lymphocytes Relative 26 %   Lymphs Abs 2.0 0.7 - 4.0 K/uL   Monocytes Relative 9 %   Monocytes Absolute 0.7 0.1 - 1.0 K/uL   Eosinophils Relative 2 %   Eosinophils Absolute 0.2 0.0 - 0.7 K/uL   Basophils Relative 0 %   Basophils Absolute 0.0 0.0 - 0.1 K/uL  Urinalysis, Microscopic (reflex)  Result Value Ref Range   RBC / HPF TOO NUMEROUS TO COUNT 0 - 5 RBC/hpf   WBC, UA 0-5 0 - 5 WBC/hpf   Bacteria, UA NONE SEEN NONE SEEN   Squamous Epithelial / LPF NONE SEEN NONE SEEN   Urine-Other CLOTS OF BLOOD PRESENT   CBC  Result Value Ref Range   WBC 7.7 4.0 - 10.5 K/uL   RBC 4.05 3.87 - 5.11 MIL/uL   Hemoglobin 12.1 12.0 - 15.0 g/dL   HCT 36.0 36.0 - 46.0 %   MCV 88.9 78.0 - 100.0 fL   MCH 29.9 26.0 - 34.0 pg   MCHC 33.6 30.0 - 36.0 g/dL   RDW 14.2 11.5 - 15.5 %   Platelets 312 150 - 400 K/uL    Results for [name], [name] (MRN [id number]) as of 05/19/2016 15:21  Ref. Range 10/30/2013 09:48 10/13/2015 12:32 05/02/2016 14:13 05/11/2016 09:30 05/19/2016 13:27  Hemoglobin Latest Ref Range: 12.0 - 15.0 g/dL 13.2 13.2 13.2 14.0 12.3  HCT Latest Ref Range: 36.0 - 46.0 % 38.1 39.2 40.0 41.2 36.6    1515:  Urine is grossly bloody, no obvious clots. Abd remains benign. Family very concerned regarding discharge home this evening.  T/C to Uro Dr. Louis Meckel, case discussed,  including:  HPI, pertinent PM/SHx, VS/PE, dx testing, ED course and treatment:  Agrees with ED workup, states pt will need xarelto held, bleeding will likely stop within 3 days, pt needs to keep hydrated to keep urine flowing (ie: not clotting/retaining urine), pt OK to go home, but if family is uncomfortable with this, pt can admit to [facility] under [facility] service and he can consult. Dx and testing, as well as d/w Uro MD, d/w pt and family.  Questions answered.  Verb understanding, but continue to be uncomfortable with d/c home and request admission overnight for observation.  T/C to [facility] Dr. Nehemiah Settle, case discussed, including:  HPI, pertinent PM/SHx, VS/PE, dx testing, ED course and treatment:  Agreeable to come to ED to speak with family, requests to repeat CBC (this may possibly reassure family of pt's stability). Pt and family informed; pt agreeable, family continues skeptical.   5597:  Pt has tol PO well while in the ED without N/V. Pt has urinated several times while in the ED; bladder scan negative. 2nd H/H reassuring. Pt feels reassured. Husband will be back at 27.    1830:  Pt's sisters are here to pick pt up and take her home. Dx and testing d/w pt and family.  Questions answered.  Verb understanding, agreeable to d/c home with  outpt f/u.      Final Clinical Impressions(s) / ED Diagnoses   Final diagnoses:  None    New Prescriptions New Prescriptions   No medications on file     Francine Graven, DO 05/23/16 9941

## 2016-05-19 NOTE — ED Notes (Signed)
No urine found during bladder scan.

## 2016-05-19 NOTE — Discharge Instructions (Signed)
Hold your xarelto until Monday.  Call your regular Urologist on Monday to schedule a follow up appointment within the next 3 days, as well as receive further instructions regarding when to re-start your xarelto.  Return to the Emergency Department immediately sooner if worsening.

## 2016-05-19 NOTE — ED Notes (Signed)
Bladder scan showed 53m of urine. Dr. MThurnell Garbenotified.

## 2016-05-19 NOTE — ED Triage Notes (Signed)
Pt reports had tumors removed from bladder Monday and started bleeding today.  Reports when she stands, blood "pours out."  Pt alert and oriented.

## 2016-05-19 NOTE — ED Notes (Signed)
Pt linens changed after episode of incontinence.  Skin cleaned and dried and new pads placed under pt.

## 2016-05-21 LAB — URINE CULTURE

## 2016-05-24 DIAGNOSIS — R310 Gross hematuria: Secondary | ICD-10-CM | POA: Diagnosis not present

## 2016-05-24 DIAGNOSIS — R350 Frequency of micturition: Secondary | ICD-10-CM | POA: Diagnosis not present

## 2016-05-25 ENCOUNTER — Telehealth: Payer: Self-pay

## 2016-05-25 NOTE — Telephone Encounter (Signed)
Patient just recently had surgery for bladder cancer (05/14/16). Has been off blood thinner (xarelto) Started blood thinner back Thursday night because patient was having no bleeding. Patient had BM on Friday and had a lot of blood in stool and stopped taking the xarelto. Saw PA yesterday with PCP office and they said everything was normal. Started RAPAFLO yesterday. Patient is having "stinging" sensations in chest as of this morning. Sensation is just happened a few times. Patient is still having some bleeding when using the restroom but PCP office says this is normal due to having bladder scrapped. Patient wants to know if she needs to start back on blood thinner now or wait until her follow up appointment with surgeons office.

## 2016-05-25 NOTE — Telephone Encounter (Signed)
It looks like her PCP has already recommended going back on Xarelto. I would recommend that she check with her surgeon's office to see what they would expect in terms of postoperative bleeding and an appropriate time to resume anticoagulation.

## 2016-05-25 NOTE — Telephone Encounter (Signed)
Patient verbalized understanding and will contact surgeons office per Dr. Chuck Hint recommendation.

## 2016-05-30 DIAGNOSIS — C678 Malignant neoplasm of overlapping sites of bladder: Secondary | ICD-10-CM | POA: Diagnosis not present

## 2016-06-07 DIAGNOSIS — R8271 Bacteriuria: Secondary | ICD-10-CM | POA: Diagnosis not present

## 2016-06-07 DIAGNOSIS — C678 Malignant neoplasm of overlapping sites of bladder: Secondary | ICD-10-CM | POA: Diagnosis not present

## 2016-07-02 DIAGNOSIS — C678 Malignant neoplasm of overlapping sites of bladder: Secondary | ICD-10-CM | POA: Diagnosis not present

## 2016-07-02 DIAGNOSIS — Z5111 Encounter for antineoplastic chemotherapy: Secondary | ICD-10-CM | POA: Diagnosis not present

## 2016-07-09 DIAGNOSIS — Z5111 Encounter for antineoplastic chemotherapy: Secondary | ICD-10-CM | POA: Diagnosis not present

## 2016-07-09 DIAGNOSIS — C678 Malignant neoplasm of overlapping sites of bladder: Secondary | ICD-10-CM | POA: Diagnosis not present

## 2016-07-16 DIAGNOSIS — C678 Malignant neoplasm of overlapping sites of bladder: Secondary | ICD-10-CM | POA: Diagnosis not present

## 2016-07-16 DIAGNOSIS — Z5111 Encounter for antineoplastic chemotherapy: Secondary | ICD-10-CM | POA: Diagnosis not present

## 2016-07-23 DIAGNOSIS — Z5111 Encounter for antineoplastic chemotherapy: Secondary | ICD-10-CM | POA: Diagnosis not present

## 2016-07-23 DIAGNOSIS — C678 Malignant neoplasm of overlapping sites of bladder: Secondary | ICD-10-CM | POA: Diagnosis not present

## 2016-07-30 DIAGNOSIS — C678 Malignant neoplasm of overlapping sites of bladder: Secondary | ICD-10-CM | POA: Diagnosis not present

## 2016-07-30 DIAGNOSIS — Z5111 Encounter for antineoplastic chemotherapy: Secondary | ICD-10-CM | POA: Diagnosis not present

## 2016-08-06 DIAGNOSIS — Z5111 Encounter for antineoplastic chemotherapy: Secondary | ICD-10-CM | POA: Diagnosis not present

## 2016-08-06 DIAGNOSIS — C678 Malignant neoplasm of overlapping sites of bladder: Secondary | ICD-10-CM | POA: Diagnosis not present

## 2016-08-12 ENCOUNTER — Other Ambulatory Visit (INDEPENDENT_AMBULATORY_CARE_PROVIDER_SITE_OTHER): Payer: Self-pay | Admitting: Internal Medicine

## 2016-08-30 NOTE — Addendum Note (Signed)
Addendum  created 08/30/16 1404 by Roberts Gaudy, MD   Sign clinical note

## 2016-09-05 DIAGNOSIS — I10 Essential (primary) hypertension: Secondary | ICD-10-CM | POA: Diagnosis not present

## 2016-09-05 DIAGNOSIS — Z13220 Encounter for screening for lipoid disorders: Secondary | ICD-10-CM | POA: Diagnosis not present

## 2016-09-05 DIAGNOSIS — I482 Chronic atrial fibrillation: Secondary | ICD-10-CM | POA: Diagnosis not present

## 2016-09-05 DIAGNOSIS — R739 Hyperglycemia, unspecified: Secondary | ICD-10-CM | POA: Diagnosis not present

## 2016-09-11 DIAGNOSIS — H353110 Nonexudative age-related macular degeneration, right eye, stage unspecified: Secondary | ICD-10-CM | POA: Diagnosis not present

## 2016-09-13 DIAGNOSIS — R739 Hyperglycemia, unspecified: Secondary | ICD-10-CM | POA: Diagnosis not present

## 2016-09-13 DIAGNOSIS — Z0001 Encounter for general adult medical examination with abnormal findings: Secondary | ICD-10-CM | POA: Diagnosis not present

## 2016-09-13 DIAGNOSIS — K51918 Ulcerative colitis, unspecified with other complication: Secondary | ICD-10-CM | POA: Diagnosis not present

## 2016-09-13 DIAGNOSIS — M129 Arthropathy, unspecified: Secondary | ICD-10-CM | POA: Diagnosis not present

## 2016-10-08 ENCOUNTER — Other Ambulatory Visit: Payer: Self-pay | Admitting: Cardiology

## 2016-10-10 ENCOUNTER — Ambulatory Visit (INDEPENDENT_AMBULATORY_CARE_PROVIDER_SITE_OTHER): Payer: Medicare Other | Admitting: Urology

## 2016-10-10 DIAGNOSIS — C674 Malignant neoplasm of posterior wall of bladder: Secondary | ICD-10-CM | POA: Diagnosis not present

## 2016-12-05 DIAGNOSIS — Z6841 Body Mass Index (BMI) 40.0 and over, adult: Secondary | ICD-10-CM | POA: Diagnosis not present

## 2016-12-05 DIAGNOSIS — J209 Acute bronchitis, unspecified: Secondary | ICD-10-CM | POA: Diagnosis not present

## 2016-12-05 DIAGNOSIS — J069 Acute upper respiratory infection, unspecified: Secondary | ICD-10-CM | POA: Diagnosis not present

## 2016-12-09 ENCOUNTER — Other Ambulatory Visit (INDEPENDENT_AMBULATORY_CARE_PROVIDER_SITE_OTHER): Payer: Self-pay | Admitting: Internal Medicine

## 2016-12-09 ENCOUNTER — Other Ambulatory Visit: Payer: Self-pay | Admitting: Cardiology

## 2016-12-31 DIAGNOSIS — I482 Chronic atrial fibrillation: Secondary | ICD-10-CM | POA: Diagnosis not present

## 2016-12-31 DIAGNOSIS — I10 Essential (primary) hypertension: Secondary | ICD-10-CM | POA: Diagnosis not present

## 2016-12-31 DIAGNOSIS — Z6841 Body Mass Index (BMI) 40.0 and over, adult: Secondary | ICD-10-CM | POA: Diagnosis not present

## 2017-01-02 ENCOUNTER — Emergency Department (HOSPITAL_COMMUNITY): Payer: Medicare Other

## 2017-01-02 ENCOUNTER — Other Ambulatory Visit: Payer: Self-pay

## 2017-01-02 ENCOUNTER — Observation Stay (HOSPITAL_COMMUNITY)
Admission: EM | Admit: 2017-01-02 | Discharge: 2017-01-03 | Disposition: A | Discharge status: Home / Self Care | Payer: Medicare Other | Attending: Internal Medicine | Admitting: Internal Medicine

## 2017-01-02 ENCOUNTER — Telehealth: Payer: Self-pay

## 2017-01-02 ENCOUNTER — Encounter (HOSPITAL_COMMUNITY): Payer: Self-pay | Admitting: Emergency Medicine

## 2017-01-02 DIAGNOSIS — Z7982 Long term (current) use of aspirin: Secondary | ICD-10-CM | POA: Insufficient documentation

## 2017-01-02 DIAGNOSIS — I4892 Unspecified atrial flutter: Secondary | ICD-10-CM | POA: Insufficient documentation

## 2017-01-02 DIAGNOSIS — E782 Mixed hyperlipidemia: Secondary | ICD-10-CM | POA: Diagnosis not present

## 2017-01-02 DIAGNOSIS — I519 Heart disease, unspecified: Secondary | ICD-10-CM | POA: Diagnosis not present

## 2017-01-02 DIAGNOSIS — Z7901 Long term (current) use of anticoagulants: Secondary | ICD-10-CM | POA: Diagnosis not present

## 2017-01-02 DIAGNOSIS — Z885 Allergy status to narcotic agent status: Secondary | ICD-10-CM | POA: Insufficient documentation

## 2017-01-02 DIAGNOSIS — Z791 Long term (current) use of non-steroidal anti-inflammatories (NSAID): Secondary | ICD-10-CM | POA: Diagnosis not present

## 2017-01-02 DIAGNOSIS — Z880 Allergy status to penicillin: Secondary | ICD-10-CM | POA: Diagnosis not present

## 2017-01-02 DIAGNOSIS — I429 Cardiomyopathy, unspecified: Secondary | ICD-10-CM | POA: Diagnosis not present

## 2017-01-02 DIAGNOSIS — I10 Essential (primary) hypertension: Secondary | ICD-10-CM | POA: Diagnosis present

## 2017-01-02 DIAGNOSIS — I4891 Unspecified atrial fibrillation: Secondary | ICD-10-CM | POA: Diagnosis present

## 2017-01-02 DIAGNOSIS — K5100 Ulcerative (chronic) pancolitis without complications: Secondary | ICD-10-CM | POA: Diagnosis not present

## 2017-01-02 DIAGNOSIS — K5190 Ulcerative colitis, unspecified, without complications: Secondary | ICD-10-CM | POA: Diagnosis not present

## 2017-01-02 DIAGNOSIS — H3530 Unspecified macular degeneration: Secondary | ICD-10-CM | POA: Insufficient documentation

## 2017-01-02 DIAGNOSIS — Z79899 Other long term (current) drug therapy: Secondary | ICD-10-CM | POA: Insufficient documentation

## 2017-01-02 DIAGNOSIS — F329 Major depressive disorder, single episode, unspecified: Secondary | ICD-10-CM | POA: Insufficient documentation

## 2017-01-02 DIAGNOSIS — M1990 Unspecified osteoarthritis, unspecified site: Secondary | ICD-10-CM | POA: Insufficient documentation

## 2017-01-02 DIAGNOSIS — I480 Paroxysmal atrial fibrillation: Secondary | ICD-10-CM | POA: Diagnosis not present

## 2017-01-02 DIAGNOSIS — R0789 Other chest pain: Secondary | ICD-10-CM | POA: Insufficient documentation

## 2017-01-02 DIAGNOSIS — I2510 Atherosclerotic heart disease of native coronary artery without angina pectoris: Secondary | ICD-10-CM | POA: Diagnosis not present

## 2017-01-02 HISTORY — DX: Hyperlipidemia, unspecified: E78.5

## 2017-01-02 LAB — URINALYSIS, ROUTINE W REFLEX MICROSCOPIC
BILIRUBIN URINE: NEGATIVE
Glucose, UA: NEGATIVE mg/dL
HGB URINE DIPSTICK: NEGATIVE
Ketones, ur: NEGATIVE mg/dL
Leukocytes, UA: NEGATIVE
Nitrite: NEGATIVE
PH: 7 (ref 5.0–8.0)
Protein, ur: NEGATIVE mg/dL
SPECIFIC GRAVITY, URINE: 1.008 (ref 1.005–1.030)

## 2017-01-02 LAB — CBC
HEMATOCRIT: 39.1 % (ref 36.0–46.0)
HEMOGLOBIN: 12.5 g/dL (ref 12.0–15.0)
MCH: 27.5 pg (ref 26.0–34.0)
MCHC: 32 g/dL (ref 30.0–36.0)
MCV: 86.1 fL (ref 78.0–100.0)
Platelets: 334 10*3/uL (ref 150–400)
RBC: 4.54 MIL/uL (ref 3.87–5.11)
RDW: 15.6 % — ABNORMAL HIGH (ref 11.5–15.5)
WBC: 9.2 10*3/uL (ref 4.0–10.5)

## 2017-01-02 LAB — COMPREHENSIVE METABOLIC PANEL
ALT: 13 U/L — AB (ref 14–54)
AST: 22 U/L (ref 15–41)
Albumin: 3.6 g/dL (ref 3.5–5.0)
Alkaline Phosphatase: 56 U/L (ref 38–126)
Anion gap: 12 (ref 5–15)
BUN: 21 mg/dL — AB (ref 6–20)
CHLORIDE: 103 mmol/L (ref 101–111)
CO2: 21 mmol/L — ABNORMAL LOW (ref 22–32)
Calcium: 9.1 mg/dL (ref 8.9–10.3)
Creatinine, Ser: 0.72 mg/dL (ref 0.44–1.00)
GFR calc Af Amer: >60 mL/min (ref >60)
GFR calc non Af Amer: >60 mL/min (ref >60)
GLUCOSE: 118 mg/dL — AB (ref 65–99)
POTASSIUM: 4 mmol/L (ref 3.5–5.1)
Sodium: 136 mmol/L (ref 135–145)
Total Bilirubin: 0.8 mg/dL (ref 0.3–1.2)
Total Protein: 7 g/dL (ref 6.5–8.1)

## 2017-01-02 LAB — TROPONIN I
Troponin I: <0.03 ng/mL (ref <0.03)
Troponin I: <0.03 ng/mL (ref <0.03)

## 2017-01-02 LAB — CBG MONITORING, ED: GLUCOSE-CAPILLARY: 94 mg/dL (ref 65–99)

## 2017-01-02 LAB — MRSA PCR SCREENING: MRSA BY PCR: NEGATIVE

## 2017-01-02 LAB — TSH: TSH: 1.97 u[IU]/mL (ref 0.350–4.500)

## 2017-01-02 LAB — BRAIN NATRIURETIC PEPTIDE: B Natriuretic Peptide: 185 pg/mL — ABNORMAL HIGH (ref 0.0–100.0)

## 2017-01-02 MED ORDER — BALSALAZIDE DISODIUM 750 MG PO CAPS
2250.0000 mg | ORAL_CAPSULE | Freq: Three times a day (TID) | ORAL | Status: DC
  Filled 2017-01-02 (×5): qty 3

## 2017-01-02 MED ORDER — RIVAROXABAN 15 MG PO TABS
15.0000 mg | ORAL_TABLET | Freq: Every day | ORAL | Status: DC
  Administered 2017-01-02 – 2017-01-03 (×2): 15 mg via ORAL
  Filled 2017-01-02 (×2): qty 1

## 2017-01-02 MED ORDER — ASPIRIN 81 MG PO TABS
81.0000 mg | ORAL_TABLET | Freq: Every day | ORAL | Status: DC
  Filled 2017-01-02 (×3): qty 1

## 2017-01-02 MED ORDER — DILTIAZEM HCL-DEXTROSE 100-5 MG/100ML-% IV SOLN (PREMIX)
5.0000 mg/h | INTRAVENOUS | Status: DC
  Administered 2017-01-03: 5 mg/h via INTRAVENOUS
  Filled 2017-01-02: qty 100

## 2017-01-02 MED ORDER — ASPIRIN 81 MG PO CHEW
CHEWABLE_TABLET | ORAL | Status: AC
  Filled 2017-01-02: qty 1

## 2017-01-02 MED ORDER — DILTIAZEM LOAD VIA INFUSION
10.0000 mg | Freq: Once | INTRAVENOUS | Status: AC
  Administered 2017-01-02: 10 mg via INTRAVENOUS
  Filled 2017-01-02: qty 10

## 2017-01-02 MED ORDER — ONDANSETRON HCL 4 MG/2ML IJ SOLN: 4.0000 mg | Freq: Four times a day (QID) | INTRAMUSCULAR | Status: DC | PRN

## 2017-01-02 MED ORDER — DILTIAZEM HCL-DEXTROSE 100-5 MG/100ML-% IV SOLN (PREMIX)
5.0000 mg/h | INTRAVENOUS | Status: DC
  Administered 2017-01-02: 5 mg/h via INTRAVENOUS
  Filled 2017-01-02: qty 100

## 2017-01-02 MED ORDER — METOPROLOL TARTRATE 25 MG PO TABS
25.0000 mg | ORAL_TABLET | Freq: Two times a day (BID) | ORAL | Status: DC
  Administered 2017-01-02 – 2017-01-03 (×2): 25 mg via ORAL
  Filled 2017-01-02 (×2): qty 1

## 2017-01-02 MED ORDER — ASPIRIN 81 MG PO CHEW
324.0000 mg | CHEWABLE_TABLET | Freq: Once | ORAL | Status: AC
  Administered 2017-01-02: 324 mg via ORAL
  Filled 2017-01-02: qty 4

## 2017-01-02 MED ORDER — METOPROLOL TARTRATE 5 MG/5ML IV SOLN
5.0000 mg | Freq: Once | INTRAVENOUS | Status: AC
  Administered 2017-01-02: 5 mg via INTRAVENOUS
  Filled 2017-01-02: qty 5

## 2017-01-02 MED ORDER — LOSARTAN POTASSIUM 50 MG PO TABS
25.0000 mg | ORAL_TABLET | Freq: Every day | ORAL | Status: DC
  Administered 2017-01-02 – 2017-01-03 (×2): 25 mg via ORAL
  Filled 2017-01-02 (×2): qty 1

## 2017-01-02 MED ORDER — ATORVASTATIN CALCIUM 20 MG PO TABS
20.0000 mg | ORAL_TABLET | Freq: Every day | ORAL | Status: DC
  Administered 2017-01-02: 20 mg via ORAL
  Filled 2017-01-02: qty 1

## 2017-01-02 MED ORDER — ACETAMINOPHEN 325 MG PO TABS: 650.0000 mg | ORAL_TABLET | ORAL | Status: DC | PRN

## 2017-01-02 NOTE — Telephone Encounter (Signed)
Patient contacted office stating she was back in afib. Patient states she seen PCP on Monday and he told her that she was in afib and needed to go to the hospital. Patient refused at that time and went home thinking she would get better. Patient states she has only gotten weaker and is having difficulty completing ADL's. Patient states her HR is all over the place and she just "feels awful". Advised patient she needed to go to the ER. Patient states she will have her husband drive her to the hospital.

## 2017-01-02 NOTE — Plan of Care (Signed)
Alert and oriented plans progressing

## 2017-01-02 NOTE — ED Notes (Signed)
Pt c/o some chest pressure that started a few minutes ago; ekg performed and given to EDP

## 2017-01-02 NOTE — ED Triage Notes (Signed)
Weakness with chest heaviness for one week. Pt here today with extreme weakness with exertion. H/o afib. Pt heart rate up to 150s

## 2017-01-02 NOTE — H&P (Signed)
History and Physical    [name] BZJ:[id number] DOB: March 28, 1930 DOA: 01/02/2017  PCP: Rory Percy, MD  Patient coming from: home  I have personally briefly reviewed patient's old medical records in [place]  Chief Complaint: chest pressure  HPI: [name] is a 83 y.o. female with medical history significant of hypertension, atrial fibrillation, hyperlipidemia, ulcerative colitis, presented to the hospital with complaints of chest discomfort.  Patient reports that for the past 3 days she has been feeling increasingly weak, no energy.  She has been dizzy on standing.  Today at approximately 3 PM, she had development of tightness around her ribs bilaterally followed by substernal chest pressure.  This may have radiated into her neck.  She felt as though someone was sitting on her chest.  No associated shortness of breath or vomiting.  No diaphoresis.  Since she has been in the emergency room, she reports her symptoms are improving.  She has no recent fever, cough, vomiting, diarrhea, dysuria.  ED Course: She was noted to be in rapid atrial fibrillation.  EKG did not show any ischemic changes.  Troponin was negative.  She was started on a Cardizem infusion referred for admission.  Review of Systems: As per HPI otherwise 10 point review of systems negative.    Past Medical History:  Diagnosis Date  . Arthritis   . Atrial flutter ([place])   . Coronary atherosclerosis of native coronary artery    BMS RCA May 2014 - [place] stent  . Depression    hx of  . Dysrhythmia   . Essential hypertension, benign   . Hyperlipemia   . Macular degeneration   . Paroxysmal atrial fibrillation (HCC)   . Secondary cardiomyopathy (HCC)    LVEF 35% - improved to 45-50%  . Ulcerative colitis     Past Surgical History:  Procedure Laterality Date  . ABDOMINAL HYSTERECTOMY    . APPENDECTOMY    . Bilateral knee replacements      2007, 2008  . BLADDER SURGERY    . COLONOSCOPY N/A  06/15/2015   Procedure: COLONOSCOPY;  Surgeon: Rogene Houston, MD;  [facility];  Service: Endoscopy;  Laterality: N/A;  210  . CYSTOSCOPY W/ RETROGRADES Bilateral 05/14/2016   Procedure: CYSTOSCOPY WITH BILATERAL RETROGRADE PYELOGRAM;  Surgeon: Raynelle Bring, MD;  Location: [facility];  Service: Urology;  Laterality: Bilateral;  GENERAL ANESTHESIA WITH PARALYSIS  . LEFT HEART CATHETERIZATION WITH CORONARY ANGIOGRAM N/A 10/30/2013   Procedure: LEFT HEART CATHETERIZATION WITH CORONARY ANGIOGRAM;  Surgeon: Burnell Blanks, MD;  Location: [facility] CATH LAB;  Service: Cardiovascular;  Laterality: N/A;  . TONSILLECTOMY    . TOTAL KNEE ARTHROPLASTY    . TRANSURETHRAL RESECTION OF BLADDER TUMOR N/A 05/14/2016   Procedure: TRANSURETHRAL RESECTION OF BLADDER TUMOR (TURBT);  Surgeon: Raynelle Bring, MD;  Location: [facility];  Service: Urology;  Laterality: N/A;  GENERAL ANESTHESIA WITH PARALYSIS  . YAG LASER APPLICATION Bilateral 01/14/5100   Procedure: YAG LASER APPLICATION;  Surgeon: Williams Che, MD;  Location: [facility] ORS;  Service: Ophthalmology;  Laterality: Bilateral;     reports that  has never smoked. she has never used smokeless tobacco. She reports that she does not drink alcohol or use drugs.  Allergies  Allergen Reactions  . Sinus & Allergy [Chlorpheniramine-Phenylephrine] Shortness Of Breath  . Demerol Rash  . Penicillins Rash and Other (See Comments)    REACTION: rash, years ago Has patient had a PCN reaction causing immediate rash, facial/tongue/throat swelling,  SOB or lightheadedness with hypotension: Yes Has patient had a PCN reaction causing severe rash involving mucus membranes or skin necrosis: No Has patient had a PCN reaction that required hospitalization No Has patient had a PCN reaction occurring within the last 10 years: No If all of the above answers are "NO", then may proceed with Cephalosporin use.     Family history: Family history reviewed and not pertinent    Prior to Admission medications   Medication Sig Start Date End Date Taking? Authorizing Provider  acetaminophen (TYLENOL ARTHRITIS PAIN) 650 MG CR tablet Take 1,300 mg by mouth every 8 (eight) hours.    Yes [provider]  aspirin 81 MG tablet Take 1 tablet (81 mg total) by mouth daily. 06/17/15  Yes Rehman, Mechele Dawley, MD  atorvastatin (LIPITOR) 20 MG tablet TAKE ONE TABLET BY MOUTH AT BEDTIME 10/08/16  Yes Satira Sark, MD  balsalazide (COLAZAL) 750 MG capsule TAKE THREE CAPSULES BY MOUTH THREE TIMES DAILY (REPLACING DELZICOL) 12/09/16  Yes Rehman, Mechele Dawley, MD  ketotifen (THERATEARS ALLERGY) 0.025 % ophthalmic solution Place 2 drops into both eyes 3 (three) times daily as needed (for dry eyes).    Yes [provider]  losartan (COZAAR) 25 MG tablet TAKE 1 TABLET BY MOUTH EVERY DAY 02/20/16  Yes Satira Sark, MD  metoprolol succinate (TOPROL-XL) 25 MG 24 hr tablet TAKE ONE TABLET BY MOUTH DAILY 10/08/16  Yes Satira Sark, MD  Multiple Vitamins-Minerals (ICAPS AREDS 2 PO) Take 2 tablets by mouth at bedtime.    Yes [provider]  nitroGLYCERIN (NITROSTAT) 0.4 MG SL tablet Place 1 tablet (0.4 mg total) under the tongue every 5 (five) minutes as needed for chest pain. 02/28/16 01/02/17 Yes Satira Sark, MD  potassium chloride SA (K-DUR,KLOR-CON) 20 MEQ tablet TAKE 1/2 TABLET BY MOUTH DAILY. 02/20/16  Yes Satira Sark, MD  triamterene-hydrochlorothiazide [facility]) 37.5-25 MG tablet TAKE 1/2 TABLET BY MOUTH DAILY 12/10/16  Yes Satira Sark, MD  XARELTO 15 MG TABS tablet TAKE ONE TABLET BY MOUTH DAILY 10/08/16  Yes Satira Sark, MD    Physical Exam: Vitals:   01/02/17 1430 01/02/17 1500 01/02/17 1530 01/02/17 1615  BP: 118/82 111/67 128/70 (!) 145/86  Pulse: (!) 34 (!) 49 68 93  Resp: 18 (!) 21 19 16   Temp:    98.2 F (36.8 C)  TempSrc:      SpO2: 98% 94% 97% 99%  Weight:    105.7 kg (233 lb 0.4 oz)  Height:    5' 3"  (1.6 m)     Constitutional: NAD, calm, comfortable Vitals:   01/02/17 1430 01/02/17 1500 01/02/17 1530 01/02/17 1615  BP: 118/82 111/67 128/70 (!) 145/86  Pulse: (!) 34 (!) 49 68 93  Resp: 18 (!) 21 19 16   Temp:    98.2 F (36.8 C)  TempSrc:      SpO2: 98% 94% 97% 99%  Weight:    105.7 kg (233 lb 0.4 oz)  Height:    5' 3"  (1.6 m)   Eyes: PERRL, lids and conjunctivae normal ENMT: Mucous membranes are moist. Posterior pharynx clear of any exudate or lesions.Normal dentition.  Neck: normal, supple, no masses, no thyromegaly Respiratory: clear to auscultation bilaterally, no wheezing, no crackles. Normal respiratory effort. No accessory muscle use.  Cardiovascular: irregular, no murmurs / rubs / gallops. No extremity edema. 2+ pedal pulses. No carotid bruits.  Abdomen: no tenderness, no masses palpated. No hepatosplenomegaly. Bowel sounds positive.  Musculoskeletal: no clubbing / cyanosis. No joint deformity upper and lower extremities. Good ROM, no contractures. Normal muscle tone.  Skin: no rashes, lesions, ulcers. No induration Neurologic: CN 2-12 grossly intact. Sensation intact, DTR normal. Strength 5/5 in all 4.  Psychiatric: Normal judgment and insight. Alert and oriented x 3. Normal mood.   Labs on Admission: I have personally reviewed following labs and imaging studies  CBC: Recent Labs  Lab 01/02/17 1051  WBC 9.2  HGB 12.5  HCT 39.1  MCV 86.1  PLT 614   Basic Metabolic Panel: Recent Labs  Lab 01/02/17 1051  NA 136  K 4.0  CL 103  CO2 21*  GLUCOSE 118*  BUN 21*  CREATININE 0.72  CALCIUM 9.1   GFR: Estimated Creatinine Clearance: 60.9 mL/min (by C-G formula based on SCr of 0.72 mg/dL). Liver Function Tests: Recent Labs  Lab 01/02/17 1051  AST 22  ALT 13*  ALKPHOS 56  BILITOT 0.8  PROT 7.0  ALBUMIN 3.6   No results for input(s): LIPASE, AMYLASE in the last 168 hours. No results for input(s): AMMONIA in the last 168 hours. Coagulation Profile: No results  for input(s): INR, PROTIME in the last 168 hours. Cardiac Enzymes: Recent Labs  Lab 01/02/17 1051  TROPONINI <0.03   BNP (last 3 results) No results for input(s): PROBNP in the last 8760 hours. HbA1C: No results for input(s): HGBA1C in the last 72 hours. CBG: Recent Labs  Lab 01/02/17 1519  GLUCAP 94   Lipid Profile: No results for input(s): CHOL, HDL, LDLCALC, TRIG, CHOLHDL, LDLDIRECT in the last 72 hours. Thyroid Function Tests: No results for input(s): TSH, T4TOTAL, FREET4, T3FREE, THYROIDAB in the last 72 hours. Anemia Panel: No results for input(s): VITAMINB12, FOLATE, FERRITIN, TIBC, IRON, RETICCTPCT in the last 72 hours. Urine analysis:    Component Value Date/Time   COLORURINE STRAW (A) 01/02/2017 1420   APPEARANCEUR CLEAR 01/02/2017 1420   LABSPEC 1.008 01/02/2017 1420   PHURINE 7.0 01/02/2017 1420   GLUCOSEU NEGATIVE 01/02/2017 1420   HGBUR NEGATIVE 01/02/2017 1420   BILIRUBINUR NEGATIVE 01/02/2017 1420   KETONESUR NEGATIVE 01/02/2017 1420   PROTEINUR NEGATIVE 01/02/2017 1420   NITRITE NEGATIVE 01/02/2017 1420   LEUKOCYTESUR NEGATIVE 01/02/2017 1420    Radiological Exams on Admission: Dg Chest 2 View  Result Date: 01/02/2017 CLINICAL DATA:  Weakness.  Atrial fibrillation. EXAM: CHEST  2 VIEW COMPARISON:  10/26/2013. FINDINGS: Mediastinum and hilar structures normal. Cardiomegaly. No pulmonary venous congestion . No focal infiltrate. No pleural effusion or pneumothorax. IMPRESSION: 1. Cardiomegaly.  No pulmonary venous congestion. 2. No focal infiltrate . Electronically Signed   By: Marcello Moores  Register   On: 01/02/2017 11:42    EKG: Independently reviewed. Atrial fib with LBBB  Assessment/Plan Active Problems:   Mixed hyperlipidemia   Essential hypertension, benign   Atrial fibrillation with RVR (HCC)     1. Atrial fibrillation rapid ventricular response.  Patient reports compliance of medications.  She has been started on a Cardizem infusion.  Will  continue on oral metoprolol.  Transition to oral meds as tolerated.  She is anticoagulated with Xarelto.  Check TSH and echocardiogram.  Cycle cardiac markers. 2. Chest discomfort.  Suspect is related to rapid heart rate from atrial flutter.  Continue to cycle cardiac markers.  Clinically, she is feeling better since arrival to the emergency room. 3. Hypertension.  Continue on beta-blockers and losartan.  Blood pressures currently stable. 4. Ulcerative colitis.  Appears to be stable.  Continue on balsalazide  5. Hyperlipidemia.  Continue on statin  DVT prophylaxis: xarelto Code Status: full code Family Communication: discussed with sister at the bedside Disposition Plan: discharge home once improved Consults called:  Admission status: observation, tele   Kathie Dike MD [facility] Pager [phone]  If 7PM-7AM, please contact night-coverage [email or web address] Password [facility]  01/02/2017, 6:28 PM

## 2017-01-02 NOTE — ED Provider Notes (Signed)
[facility] EMERGENCY DEPARTMENT Provider Note   CSN: [id number] Arrival date & time: 01/02/17  9407     History   Chief Complaint Chief Complaint  Patient presents with  . Chest Pain    HPI [name] is a 83 y.o. female.   Chest Pain   This is a new problem. The current episode started more than 2 days ago. The problem occurs daily. The problem has not changed since onset.The pain is associated with exertion. The pain is present in the substernal region. The pain is mild. The quality of the pain is described as brief. The pain does not radiate. The symptoms are aggravated by certain positions and exertion. Associated symptoms include palpitations and weakness.  Weakness  Associated symptoms include chest pain.    Past Medical History:  Diagnosis Date  . Arthritis   . Atrial flutter ([place])   . Coronary atherosclerosis of native coronary artery    BMS RCA May 2014 - [place] stent  . Depression    hx of  . Dysrhythmia   . Essential hypertension, benign   . Hyperlipemia   . Macular degeneration   . Paroxysmal atrial fibrillation (HCC)   . Secondary cardiomyopathy (HCC)    LVEF 35% - improved to 45-50%  . Ulcerative colitis     Patient Active Problem List   Diagnosis Date Noted  . Atrial fibrillation with RVR ([place]) 01/02/2017  . Arthritis of midfoot 01/13/2016  . Right shoulder pain 01/13/2016  . Ulcerative colitis ([place]) 05/03/2014  . Bilateral leg edema 11/30/2013  . Abnormal myocardial perfusion study 09/17/2013  . Coronary atherosclerosis of native coronary artery 09/03/2012  . Secondary cardiomyopathy ([place]) 09/03/2012  . Paroxysmal atrial fibrillation ([place]) 03/12/2012  . Mixed hyperlipidemia 01/21/2009  . Essential hypertension, benign 01/21/2009  . ATRIAL FLUTTER 10/06/2008    Past Surgical History:  Procedure Laterality Date  . ABDOMINAL HYSTERECTOMY    . APPENDECTOMY    . Bilateral knee replacements      2007, 2008  . BLADDER SURGERY      . COLONOSCOPY N/A 06/15/2015   Procedure: COLONOSCOPY;  Surgeon: Rogene Houston, MD;  [facility];  Service: Endoscopy;  Laterality: N/A;  210  . CYSTOSCOPY W/ RETROGRADES Bilateral 05/14/2016   Procedure: CYSTOSCOPY WITH BILATERAL RETROGRADE PYELOGRAM;  Surgeon: Raynelle Bring, MD;  Location: [facility];  Service: Urology;  Laterality: Bilateral;  GENERAL ANESTHESIA WITH PARALYSIS  . LEFT HEART CATHETERIZATION WITH CORONARY ANGIOGRAM N/A 10/30/2013   Procedure: LEFT HEART CATHETERIZATION WITH CORONARY ANGIOGRAM;  Surgeon: Burnell Blanks, MD;  Location: [facility] CATH LAB;  Service: Cardiovascular;  Laterality: N/A;  . TONSILLECTOMY    . TOTAL KNEE ARTHROPLASTY    . TRANSURETHRAL RESECTION OF BLADDER TUMOR N/A 05/14/2016   Procedure: TRANSURETHRAL RESECTION OF BLADDER TUMOR (TURBT);  Surgeon: Raynelle Bring, MD;  Location: [facility];  Service: Urology;  Laterality: N/A;  GENERAL ANESTHESIA WITH PARALYSIS  . YAG LASER APPLICATION Bilateral 68/0/8811   Procedure: YAG LASER APPLICATION;  Surgeon: Williams Che, MD;  Location: [facility] ORS;  Service: Ophthalmology;  Laterality: Bilateral;    OB History    No data available       Home Medications    Prior to Admission medications   Medication Sig Start Date End Date Taking? Authorizing Provider  acetaminophen (TYLENOL ARTHRITIS PAIN) 650 MG CR tablet Take 1,300 mg by mouth every 8 (eight) hours.    Yes [provider]  aspirin 81 MG tablet Take 1  tablet (81 mg total) by mouth daily. 06/17/15  Yes Rehman, Mechele Dawley, MD  atorvastatin (LIPITOR) 20 MG tablet TAKE ONE TABLET BY MOUTH AT BEDTIME 10/08/16  Yes Satira Sark, MD  balsalazide (COLAZAL) 750 MG capsule TAKE THREE CAPSULES BY MOUTH THREE TIMES DAILY (REPLACING DELZICOL) 12/09/16  Yes Rehman, Mechele Dawley, MD  ketotifen (THERATEARS ALLERGY) 0.025 % ophthalmic solution Place 2 drops into both eyes 3 (three) times daily as needed (for dry eyes).    Yes [provider]   losartan (COZAAR) 25 MG tablet TAKE 1 TABLET BY MOUTH EVERY DAY 02/20/16  Yes Satira Sark, MD  metoprolol succinate (TOPROL-XL) 25 MG 24 hr tablet TAKE ONE TABLET BY MOUTH DAILY 10/08/16  Yes Satira Sark, MD  Multiple Vitamins-Minerals (ICAPS AREDS 2 PO) Take 2 tablets by mouth at bedtime.    Yes [provider]  nitroGLYCERIN (NITROSTAT) 0.4 MG SL tablet Place 1 tablet (0.4 mg total) under the tongue every 5 (five) minutes as needed for chest pain. 02/28/16 01/02/17 Yes Satira Sark, MD  potassium chloride SA (K-DUR,KLOR-CON) 20 MEQ tablet TAKE 1/2 TABLET BY MOUTH DAILY. 02/20/16  Yes Satira Sark, MD  triamterene-hydrochlorothiazide [facility]) 37.5-25 MG tablet TAKE 1/2 TABLET BY MOUTH DAILY 12/10/16  Yes Satira Sark, MD  XARELTO 15 MG TABS tablet TAKE ONE TABLET BY MOUTH DAILY 10/08/16  Yes Satira Sark, MD    Family History History reviewed. No pertinent family history.  Social History Social History   Tobacco Use  . Smoking status: Never Smoker  . Smokeless tobacco: Never Used  Substance Use Topics  . Alcohol use: No    Alcohol/week: 0.0 oz  . Drug use: No     Allergies   Sinus & allergy [chlorpheniramine-phenylephrine]; Demerol; and Penicillins   Review of Systems Review of Systems  Cardiovascular: Positive for chest pain and palpitations.  Neurological: Positive for weakness.  All other systems reviewed and are negative.    Physical Exam Updated Vital Signs BP 130/73   Pulse (!) 48   Temp 98.2 F (36.8 C) (Oral)   Resp (!) 21   Wt 101.2 kg (223 lb)   SpO2 95%   BMI 39.50 kg/m   Physical Exam  Constitutional: She is oriented to person, place, and time. She appears well-developed and well-nourished.  HENT:  Head: Normocephalic and atraumatic.  Eyes: EOM are normal. Pupils are equal, round, and reactive to light.  Neck: Normal range of motion.  Cardiovascular: An irregularly irregular rhythm present. Tachycardia  present.  Pulmonary/Chest: Effort normal. No stridor. No respiratory distress. She has no decreased breath sounds. She has rales (mild in bases).  Abdominal: She exhibits no distension.  Musculoskeletal: Normal range of motion. She exhibits no edema or deformity.  Neurological: She is alert and oriented to person, place, and time. No cranial nerve deficit.  Skin: Skin is warm and dry.  Nursing note and vitals reviewed.    ED Treatments / Results  Labs (all labs ordered are listed, but only abnormal results are displayed) Labs Reviewed  COMPREHENSIVE METABOLIC PANEL - Abnormal; Notable for the following components:      Result Value   CO2 21 (*)    Glucose, Bld 118 (*)    BUN 21 (*)    ALT 13 (*)    All other components within normal limits  CBC - Abnormal; Notable for the following components:   RDW 15.6 (*)    All other components within normal limits  URINALYSIS, ROUTINE W REFLEX MICROSCOPIC - Abnormal; Notable for the following components:   Color, Urine STRAW (*)    All other components within normal limits  BRAIN NATRIURETIC PEPTIDE - Abnormal; Notable for the following components:   B Natriuretic Peptide 185.0 (*)    All other components within normal limits  TROPONIN I  CBG MONITORING, ED    EKG  EKG Interpretation  Date/Time:  Wednesday January 02 2017 10:26:12 EST Ventricular Rate:  138 PR Interval:    QRS Duration: 158 QT Interval:  336 QTC Calculation: 510 R Axis:   -53 Text Interpretation:  Atrial fibrillation Left bundle branch block Afib new Confirmed by Merrily Pew [phone]) on 01/02/2017 10:38:24 AM       Radiology Dg Chest 2 View  Result Date: 01/02/2017 CLINICAL DATA:  Weakness.  Atrial fibrillation. EXAM: CHEST  2 VIEW COMPARISON:  10/26/2013. FINDINGS: Mediastinum and hilar structures normal. Cardiomegaly. No pulmonary venous congestion . No focal infiltrate. No pleural effusion or pneumothorax. IMPRESSION: 1. Cardiomegaly.  No pulmonary  venous congestion. 2. No focal infiltrate . Electronically Signed   By: Marcello Moores  Register   On: 01/02/2017 11:42    Procedures Procedures (including critical care time)  CRITICAL CARE Performed by: Merrily Pew Total critical care time: 35 minutes Critical care time was exclusive of separately billable procedures and treating other patients. Critical care was necessary to treat or prevent imminent or life-threatening deterioration. Critical care was time spent personally by me on the following activities: development of treatment plan with patient and/or surrogate as well as nursing, discussions with consultants, evaluation of patient's response to treatment, examination of patient, obtaining history from patient or surrogate, ordering and performing treatments and interventions, ordering and review of laboratory studies, ordering and review of radiographic studies, pulse oximetry and re-evaluation of patient's condition.   Medications Ordered in ED Medications  diltiazem (CARDIZEM) 1 mg/mL load via infusion 10 mg (10 mg Intravenous Bolus from Bag 01/02/17 1314)    And  diltiazem (CARDIZEM) 100 mg in dextrose 5% 149m (1 mg/mL) infusion (5 mg/hr Intravenous New Bag/Given 01/02/17 1313)  aspirin chewable tablet 324 mg (324 mg Oral Given 01/02/17 1126)  metoprolol tartrate (LOPRESSOR) injection 5 mg (5 mg Intravenous Given 01/02/17 1127)     Initial Impression / Assessment and Plan / ED Course  I have reviewed the triage vital signs and the nursing notes.  Pertinent labs & imaging results that were available during my care of the patient were reviewed by me and considered in my medical decision making (see chart for details).     CP, h/o stent. Also with palpitations. Found to be Afib RVR. Tried dose of metoprolol as that is what she is on at home but did not help. Will start dilt/drip for same. Will consult for admission.   Final Clinical Impressions(s) / ED Diagnoses   Final  diagnoses:  Atrial fibrillation with rapid ventricular response ([facility]    ED Discharge Orders    None       Oddis Westling, JCorene Cornea MD 01/02/17 1510

## 2017-01-03 ENCOUNTER — Observation Stay (HOSPITAL_BASED_OUTPATIENT_CLINIC_OR_DEPARTMENT_OTHER): Payer: Medicare Other

## 2017-01-03 DIAGNOSIS — I429 Cardiomyopathy, unspecified: Secondary | ICD-10-CM | POA: Diagnosis not present

## 2017-01-03 DIAGNOSIS — I4891 Unspecified atrial fibrillation: Secondary | ICD-10-CM | POA: Diagnosis not present

## 2017-01-03 DIAGNOSIS — Z79899 Other long term (current) drug therapy: Secondary | ICD-10-CM | POA: Diagnosis not present

## 2017-01-03 DIAGNOSIS — I519 Heart disease, unspecified: Secondary | ICD-10-CM

## 2017-01-03 DIAGNOSIS — Z885 Allergy status to narcotic agent status: Secondary | ICD-10-CM | POA: Diagnosis not present

## 2017-01-03 DIAGNOSIS — Z880 Allergy status to penicillin: Secondary | ICD-10-CM | POA: Diagnosis not present

## 2017-01-03 DIAGNOSIS — I10 Essential (primary) hypertension: Secondary | ICD-10-CM | POA: Diagnosis not present

## 2017-01-03 DIAGNOSIS — Z791 Long term (current) use of non-steroidal anti-inflammatories (NSAID): Secondary | ICD-10-CM | POA: Diagnosis not present

## 2017-01-03 DIAGNOSIS — Z7982 Long term (current) use of aspirin: Secondary | ICD-10-CM | POA: Diagnosis not present

## 2017-01-03 DIAGNOSIS — I4892 Unspecified atrial flutter: Secondary | ICD-10-CM | POA: Diagnosis not present

## 2017-01-03 DIAGNOSIS — R0789 Other chest pain: Secondary | ICD-10-CM | POA: Diagnosis not present

## 2017-01-03 DIAGNOSIS — I480 Paroxysmal atrial fibrillation: Secondary | ICD-10-CM | POA: Diagnosis not present

## 2017-01-03 DIAGNOSIS — E782 Mixed hyperlipidemia: Secondary | ICD-10-CM | POA: Diagnosis not present

## 2017-01-03 DIAGNOSIS — F329 Major depressive disorder, single episode, unspecified: Secondary | ICD-10-CM | POA: Diagnosis not present

## 2017-01-03 DIAGNOSIS — Z7901 Long term (current) use of anticoagulants: Secondary | ICD-10-CM | POA: Diagnosis not present

## 2017-01-03 DIAGNOSIS — I2510 Atherosclerotic heart disease of native coronary artery without angina pectoris: Secondary | ICD-10-CM | POA: Diagnosis not present

## 2017-01-03 DIAGNOSIS — M1990 Unspecified osteoarthritis, unspecified site: Secondary | ICD-10-CM | POA: Diagnosis not present

## 2017-01-03 DIAGNOSIS — K5190 Ulcerative colitis, unspecified, without complications: Secondary | ICD-10-CM | POA: Diagnosis not present

## 2017-01-03 LAB — ECHOCARDIOGRAM COMPLETE
AVLVOTPG: 3 mmHg
CHL CUP RV SYS PRESS: 23 mmHg
CHL CUP TV REG PEAK VELOCITY: 222 cm/s
EWDT: 236 ms
FS: 20 % — AB (ref 28–44)
HEIGHTINCHES: 63 in
IVS/LV PW RATIO, ED: 1.04
LA ID, A-P, ES: 35 mm
LA diam end sys: 35 mm
LA vol A4C: 80.5 ml
LA vol index: 38.9 mL/m2
LA vol: 86.7 mL
LADIAMINDEX: 1.57 cm/m2
LV SIMPSON'S DISK: 44
LV dias vol index: 45 mL/m2
LV dias vol: 100 mL (ref 46–106)
LV sys vol index: 25 mL/m2
LV sys vol: 56 mL — AB (ref 14–42)
LVOT SV: 63 mL
LVOT VTI: 16.6 cm
LVOT area: 3.8 cm2
LVOT diameter: 22 mm
LVOTPV: 86.8 cm/s
MV Dec: 236
MVPG: 4 mmHg
MVPKEVEL: 102 m/s
PW: 10.6 mm — AB (ref 0.6–1.1)
RV TAPSE: 15.8 mm
Stroke v: 44 ml
TRMAXVEL: 222 cm/s
WEIGHTICAEL: 3739 [oz_av]

## 2017-01-03 LAB — BASIC METABOLIC PANEL
ANION GAP: 7 (ref 5–15)
BUN: 19 mg/dL (ref 6–20)
CHLORIDE: 105 mmol/L (ref 101–111)
CO2: 28 mmol/L (ref 22–32)
CREATININE: 0.82 mg/dL (ref 0.44–1.00)
Calcium: 9.5 mg/dL (ref 8.9–10.3)
GFR calc non Af Amer: >60 mL/min (ref >60)
Glucose, Bld: 108 mg/dL — ABNORMAL HIGH (ref 65–99)
Potassium: 4.4 mmol/L (ref 3.5–5.1)
SODIUM: 140 mmol/L (ref 135–145)

## 2017-01-03 LAB — CBC
HCT: 39.2 % (ref 36.0–46.0)
HEMOGLOBIN: 12.2 g/dL (ref 12.0–15.0)
MCH: 27.2 pg (ref 26.0–34.0)
MCHC: 31.1 g/dL (ref 30.0–36.0)
MCV: 87.3 fL (ref 78.0–100.0)
PLATELETS: 325 10*3/uL (ref 150–400)
RBC: 4.49 MIL/uL (ref 3.87–5.11)
RDW: 15.7 % — ABNORMAL HIGH (ref 11.5–15.5)
WBC: 9.6 10*3/uL (ref 4.0–10.5)

## 2017-01-03 LAB — TROPONIN I

## 2017-01-03 MED ORDER — METOPROLOL SUCCINATE ER 25 MG PO TB24: 25.0000 mg | ORAL_TABLET | Freq: Two times a day (BID) | ORAL | 3 refills | Status: DC

## 2017-01-03 MED ORDER — ASPIRIN 81 MG PO CHEW
81.0000 mg | CHEWABLE_TABLET | Freq: Every day | ORAL | Status: DC
  Administered 2017-01-03: 81 mg via ORAL
  Filled 2017-01-03: qty 1

## 2017-01-03 NOTE — Progress Notes (Signed)
Patient walked around unit without any complications. Her heart rate remained in the 90s-100s while walking and she had no issues recovering once she was back in her room resting. Dr. Roderic Palau notified via text page.

## 2017-01-03 NOTE — Progress Notes (Signed)
*  PRELIMINARY RESULTS* Echocardiogram 2D Echocardiogram has been performed.  [name] 01/03/2017, 11:32 AM

## 2017-01-03 NOTE — Discharge Summary (Signed)
Physician Discharge Summary  DALLAS SCORSONE DJS:[id number] DOB: December 18, 1930 DOA: 01/02/2017  PCP: Rory Percy, MD  Admit date: 01/02/2017 Discharge date: 01/03/2017  Admitted From: Home Disposition: Home  Recommendations for Outpatient Follow-up:  1. Follow up with PCP in 1-2 weeks 2. Please obtain BMP/CBC in one week 3. Follow-up with cardiology as an outpatient  Home Health: Equipment/Devices:  Discharge Condition: Stable CODE STATUS: Full code) Diet recommendation: Heart Healthy   Brief/Interim Summary: 83 year old female with a history of atrial fibrillation, hyperlipidemia, ulcerative colitis, presented to the hospital with complaints of chest tightness and shortness of breath.  Found to be in rapid atrial fibrillation.  Admitted to the stepdown unit and started on Cardizem infusion.  Her heart rate was better controlled and she was transitioned to oral beta-blockers.  Since controlling her heart rate, her shortness of breath has resolved.  Patient was ambulated and heart rate remained stable.  Toprol dose was increased from 25 mg daily to twice daily.  Echocardiogram was performed that showed a decline in ejection fraction to 30-35%.  Previous echo indicated EF of 45%.  Unclear if this is a new finding.  She appears to have global hypokinesis.  Troponins have been negative throughout her hospital stay.  Discussed with Dr. Harrington Challenger, felt appropriate to follow-up with cardiology as an outpatient.  She is continued on anticoagulation with Xarelto.  The remainder of her medical problems remained stable.  Discharge Diagnoses:  Active Problems:   Mixed hyperlipidemia   Essential hypertension, benign   Ulcerative colitis ([place])   Atrial fibrillation with RVR (HCC)   Chronic systolic dysfunction of left ventricle    Discharge Instructions  Discharge Instructions    Diet - low sodium heart healthy   Complete by:  As directed    Increase activity slowly   Complete by:  As directed       Allergies as of 01/03/2017      Reactions   Sinus & Allergy [chlorpheniramine-phenylephrine] Shortness Of Breath   Demerol Rash   Penicillins Rash, Other (See Comments)   REACTION: rash, years ago Has patient had a PCN reaction causing immediate rash, facial/tongue/throat swelling, SOB or lightheadedness with hypotension: Yes Has patient had a PCN reaction causing severe rash involving mucus membranes or skin necrosis: No Has patient had a PCN reaction that required hospitalization No Has patient had a PCN reaction occurring within the last 10 years: No If all of the above answers are "NO", then may proceed with Cephalosporin use.      Medication List    TAKE these medications   aspirin 81 MG tablet Take 1 tablet (81 mg total) by mouth daily.   atorvastatin 20 MG tablet Commonly known as:  LIPITOR TAKE ONE TABLET BY MOUTH AT BEDTIME   balsalazide 750 MG capsule Commonly known as:  COLAZAL TAKE THREE CAPSULES BY MOUTH THREE TIMES DAILY (REPLACING DELZICOL)   ICAPS AREDS 2 PO Take 2 tablets by mouth at bedtime.   losartan 25 MG tablet Commonly known as:  COZAAR TAKE 1 TABLET BY MOUTH EVERY DAY   metoprolol succinate 25 MG 24 hr tablet Commonly known as:  TOPROL-XL Take 1 tablet (25 mg total) by mouth 2 (two) times daily. What changed:  when to take this   nitroGLYCERIN 0.4 MG SL tablet Commonly known as:  NITROSTAT Place 1 tablet (0.4 mg total) under the tongue every 5 (five) minutes as needed for chest pain.   potassium chloride SA 20 MEQ tablet Commonly known as:  K-DUR,KLOR-CON TAKE 1/2 TABLET BY MOUTH DAILY.   THERATEARS ALLERGY 0.025 % ophthalmic solution Generic drug:  ketotifen Place 2 drops into both eyes 3 (three) times daily as needed (for dry eyes).   triamterene-hydrochlorothiazide 37.5-25 MG tablet Commonly known as:  MAXZIDE-25 TAKE 1/2 TABLET BY MOUTH DAILY   TYLENOL ARTHRITIS PAIN 650 MG CR tablet Generic drug:  acetaminophen Take 1,300 mg  by mouth every 8 (eight) hours.   XARELTO 15 MG Tabs tablet Generic drug:  Rivaroxaban TAKE ONE TABLET BY MOUTH DAILY       Allergies  Allergen Reactions  . Sinus & Allergy [Chlorpheniramine-Phenylephrine] Shortness Of Breath  . Demerol Rash  . Penicillins Rash and Other (See Comments)    REACTION: rash, years ago Has patient had a PCN reaction causing immediate rash, facial/tongue/throat swelling, SOB or lightheadedness with hypotension: Yes Has patient had a PCN reaction causing severe rash involving mucus membranes or skin necrosis: No Has patient had a PCN reaction that required hospitalization No Has patient had a PCN reaction occurring within the last 10 years: No If all of the above answers are "NO", then may proceed with Cephalosporin use.     Consultations:     Procedures/Studies: Dg Chest 2 View  Result Date: 01/02/2017 CLINICAL DATA:  Weakness.  Atrial fibrillation. EXAM: CHEST  2 VIEW COMPARISON:  10/26/2013. FINDINGS: Mediastinum and hilar structures normal. Cardiomegaly. No pulmonary venous congestion . No focal infiltrate. No pleural effusion or pneumothorax. IMPRESSION: 1. Cardiomegaly.  No pulmonary venous congestion. 2. No focal infiltrate . Electronically Signed   By: Marcello Moores  Register   On: 01/02/2017 11:42   Echo: - Left ventricle: The cavity size was normal. Wall thickness was   normal. Systolic function was moderately to severely reduced. The   estimated ejection fraction was in the range of 30% to 35%. - Left atrium: The atrium was mildly dilated.   Subjective: Doing better.  Shortness of breath has improved since rate is better controlled.  No chest discomfort.  Discharge Exam: Vitals:   01/03/17 1104 01/03/17 1623  BP:    Pulse:    Resp: (!) 23 (!) 22  Temp: 98.6 F (37 C) 98.6 F (37 C)  SpO2:     Vitals:   01/03/17 0845 01/03/17 0900 01/03/17 1104 01/03/17 1623  BP: 129/73 116/69    Pulse: 68 72    Resp: (!) 23 19 (!) 23 (!) 22   Temp:   98.6 F (37 C) 98.6 F (37 C)  TempSrc:   Oral Oral  SpO2: 96% 94%    Weight:      Height:        General: Pt is alert, awake, not in acute distress Cardiovascular: irregular, S1/S2 +, no rubs, no gallops Respiratory: CTA bilaterally, no wheezing, no rhonchi Abdominal: Soft, NT, ND, bowel sounds + Extremities: no edema, no cyanosis    The results of significant diagnostics from this hospitalization (including imaging, microbiology, ancillary and laboratory) are listed below for reference.     Microbiology: Recent Results (from the past 240 hour(s))  MRSA PCR Screening     Status: None   Collection Time: 01/02/17  7:35 PM  Result Value Ref Range Status   MRSA by PCR NEGATIVE NEGATIVE Final    Comment:        The GeneXpert MRSA Assay (FDA approved for NASAL specimens only), is one component of a comprehensive MRSA colonization surveillance program. It is not intended to diagnose MRSA infection nor to guide  or monitor treatment for MRSA infections.      Labs: BNP (last 3 results) Recent Labs    01/02/17 1102  BNP 950.9*   Basic Metabolic Panel: Recent Labs  Lab 01/02/17 1051 01/03/17 0226  NA 136 140  K 4.0 4.4  CL 103 105  CO2 21* 28  GLUCOSE 118* 108*  BUN 21* 19  CREATININE 0.72 0.82  CALCIUM 9.1 9.5   Liver Function Tests: Recent Labs  Lab 01/02/17 1051  AST 22  ALT 13*  ALKPHOS 56  BILITOT 0.8  PROT 7.0  ALBUMIN 3.6   No results for input(s): LIPASE, AMYLASE in the last 168 hours. No results for input(s): AMMONIA in the last 168 hours. CBC: Recent Labs  Lab 01/02/17 1051 01/03/17 0226  WBC 9.2 9.6  HGB 12.5 12.2  HCT 39.1 39.2  MCV 86.1 87.3  PLT 334 325   Cardiac Enzymes: Recent Labs  Lab 01/02/17 1051 01/02/17 1944 01/03/17 0226 01/03/17 0757  TROPONINI <0.03 <0.03 <0.03 <0.03   BNP: Invalid input(s): POCBNP CBG: Recent Labs  Lab 01/02/17 1519  GLUCAP 94   D-Dimer No results for input(s): DDIMER in the  last 72 hours. Hgb A1c No results for input(s): HGBA1C in the last 72 hours. Lipid Profile No results for input(s): CHOL, HDL, LDLCALC, TRIG, CHOLHDL, LDLDIRECT in the last 72 hours. Thyroid function studies Recent Labs    01/02/17 1944  TSH 1.970   Anemia work up No results for input(s): VITAMINB12, FOLATE, FERRITIN, TIBC, IRON, RETICCTPCT in the last 72 hours. Urinalysis    Component Value Date/Time   COLORURINE STRAW (A) 01/02/2017 1420   APPEARANCEUR CLEAR 01/02/2017 1420   LABSPEC 1.008 01/02/2017 1420   PHURINE 7.0 01/02/2017 1420   GLUCOSEU NEGATIVE 01/02/2017 1420   HGBUR NEGATIVE 01/02/2017 1420   BILIRUBINUR NEGATIVE 01/02/2017 1420   KETONESUR NEGATIVE 01/02/2017 1420   PROTEINUR NEGATIVE 01/02/2017 1420   NITRITE NEGATIVE 01/02/2017 1420   LEUKOCYTESUR NEGATIVE 01/02/2017 1420   Sepsis Labs Invalid input(s): PROCALCITONIN,  WBC,  LACTICIDVEN Microbiology Recent Results (from the past 240 hour(s))  MRSA PCR Screening     Status: None   Collection Time: 01/02/17  7:35 PM  Result Value Ref Range Status   MRSA by PCR NEGATIVE NEGATIVE Final    Comment:        The GeneXpert MRSA Assay (FDA approved for NASAL specimens only), is one component of a comprehensive MRSA colonization surveillance program. It is not intended to diagnose MRSA infection nor to guide or monitor treatment for MRSA infections.      Time coordinating discharge: Over 30 minutes  SIGNED:   Kathie Dike, MD  [facility] 01/03/2017, 4:45 PM Pager   If 7PM-7AM, please contact night-coverage [email or web address] Password [email or web address]

## 2017-01-03 NOTE — Care Management Obs Status (Signed)
[place] NOTIFICATION   Patient Details  Name: [name] MRN: [id number] Date of Birth: 01-Mar-1930   Medicare Observation Status Notification Given:  Yes    Sherald Barge, RN 01/03/2017, 9:19 AM

## 2017-01-07 ENCOUNTER — Telehealth: Payer: Self-pay | Admitting: *Deleted

## 2017-01-07 DIAGNOSIS — I10 Essential (primary) hypertension: Secondary | ICD-10-CM

## 2017-01-07 DIAGNOSIS — R600 Localized edema: Secondary | ICD-10-CM

## 2017-01-07 MED ORDER — POTASSIUM CHLORIDE CRYS ER 20 MEQ PO TBCR: 20.0000 meq | EXTENDED_RELEASE_TABLET | Freq: Every day | ORAL | 6 refills | Status: DC

## 2017-01-07 MED ORDER — FUROSEMIDE 40 MG PO TABS: 40.0000 mg | ORAL_TABLET | Freq: Every day | ORAL | 6 refills | Status: DC

## 2017-01-07 NOTE — Telephone Encounter (Signed)
Patient notified and verbalized understanding.  New medication called to [facility] now.  Patient does bubble pack so her husband will come today to pick up the 2 new ones.  Not due to get new bubble pack till around 01/25/2017.  Will mail lab order to her home & she will have done at her pmd office around 01/21/2017.  She will call back if have any further concerns before then.

## 2017-01-07 NOTE — Telephone Encounter (Signed)
Patient discharged Friday evening (01/04/17) from [facility].  Now noticed leg swelling x last 2 days.  Swelling up to her knees.  Felt like her face was puffy last evening.  Has been sitting with feet propped up.  BP this morning 154/91  HR 89.  She has not checked her weight since coming home from hospital, thought she was down 3 pounds when she came home.  SOB if she gets up to do anything & weakness by the evening.  Next appointment to see provider is 01/25/2017.

## 2017-01-07 NOTE — Telephone Encounter (Signed)
Records reviewed. She was hospitalized with rapid atrial fibrillation, managed with heart rate control strategy and continued on anticoagulation. LVEF also reduced compared to previous assessment. If weights are correct, she might be up 10 pounds over typical baseline. Would stop Maxide and start Lasix at 40 mg daily, continue KCl but take 20 mEq daily. She needs a follow-up BMET in the next 2 weeks. Keep an eye on weight.

## 2017-01-21 ENCOUNTER — Emergency Department (HOSPITAL_COMMUNITY): Payer: Medicare Other

## 2017-01-21 ENCOUNTER — Other Ambulatory Visit: Payer: Self-pay

## 2017-01-21 ENCOUNTER — Inpatient Hospital Stay (HOSPITAL_COMMUNITY)
Admission: EM | Admit: 2017-01-21 | Discharge: 2017-01-22 | DRG: 291 | Disposition: A | Discharge status: Home / Self Care | Payer: Medicare Other | Attending: Internal Medicine | Admitting: Internal Medicine

## 2017-01-21 ENCOUNTER — Encounter (HOSPITAL_COMMUNITY): Payer: Self-pay | Admitting: Emergency Medicine

## 2017-01-21 DIAGNOSIS — I429 Cardiomyopathy, unspecified: Secondary | ICD-10-CM | POA: Diagnosis present

## 2017-01-21 DIAGNOSIS — I5021 Acute systolic (congestive) heart failure: Secondary | ICD-10-CM | POA: Diagnosis not present

## 2017-01-21 DIAGNOSIS — Z880 Allergy status to penicillin: Secondary | ICD-10-CM | POA: Diagnosis not present

## 2017-01-21 DIAGNOSIS — I4891 Unspecified atrial fibrillation: Secondary | ICD-10-CM

## 2017-01-21 DIAGNOSIS — Z96653 Presence of artificial knee joint, bilateral: Secondary | ICD-10-CM | POA: Diagnosis not present

## 2017-01-21 DIAGNOSIS — Z888 Allergy status to other drugs, medicaments and biological substances status: Secondary | ICD-10-CM

## 2017-01-21 DIAGNOSIS — I2510 Atherosclerotic heart disease of native coronary artery without angina pectoris: Secondary | ICD-10-CM | POA: Diagnosis present

## 2017-01-21 DIAGNOSIS — Z7982 Long term (current) use of aspirin: Secondary | ICD-10-CM | POA: Diagnosis not present

## 2017-01-21 DIAGNOSIS — F329 Major depressive disorder, single episode, unspecified: Secondary | ICD-10-CM | POA: Diagnosis not present

## 2017-01-21 DIAGNOSIS — Z8719 Personal history of other diseases of the digestive system: Secondary | ICD-10-CM | POA: Diagnosis not present

## 2017-01-21 DIAGNOSIS — Z79899 Other long term (current) drug therapy: Secondary | ICD-10-CM | POA: Diagnosis not present

## 2017-01-21 DIAGNOSIS — R0602 Shortness of breath: Secondary | ICD-10-CM | POA: Diagnosis not present

## 2017-01-21 DIAGNOSIS — E785 Hyperlipidemia, unspecified: Secondary | ICD-10-CM | POA: Diagnosis present

## 2017-01-21 DIAGNOSIS — Z885 Allergy status to narcotic agent status: Secondary | ICD-10-CM | POA: Diagnosis not present

## 2017-01-21 DIAGNOSIS — R29898 Other symptoms and signs involving the musculoskeletal system: Secondary | ICD-10-CM | POA: Diagnosis not present

## 2017-01-21 DIAGNOSIS — I509 Heart failure, unspecified: Secondary | ICD-10-CM | POA: Diagnosis not present

## 2017-01-21 DIAGNOSIS — Z7901 Long term (current) use of anticoagulants: Secondary | ICD-10-CM | POA: Diagnosis not present

## 2017-01-21 DIAGNOSIS — R0600 Dyspnea, unspecified: Secondary | ICD-10-CM | POA: Diagnosis not present

## 2017-01-21 DIAGNOSIS — R5381 Other malaise: Secondary | ICD-10-CM | POA: Diagnosis not present

## 2017-01-21 DIAGNOSIS — I480 Paroxysmal atrial fibrillation: Secondary | ICD-10-CM | POA: Diagnosis present

## 2017-01-21 DIAGNOSIS — I110 Hypertensive heart disease with heart failure: Principal | ICD-10-CM | POA: Diagnosis present

## 2017-01-21 DIAGNOSIS — R1010 Upper abdominal pain, unspecified: Secondary | ICD-10-CM | POA: Diagnosis present

## 2017-01-21 DIAGNOSIS — I10 Essential (primary) hypertension: Secondary | ICD-10-CM

## 2017-01-21 DIAGNOSIS — E876 Hypokalemia: Secondary | ICD-10-CM | POA: Diagnosis not present

## 2017-01-21 DIAGNOSIS — K5100 Ulcerative (chronic) pancolitis without complications: Secondary | ICD-10-CM | POA: Diagnosis not present

## 2017-01-21 DIAGNOSIS — K5190 Ulcerative colitis, unspecified, without complications: Secondary | ICD-10-CM | POA: Diagnosis present

## 2017-01-21 LAB — BASIC METABOLIC PANEL
Anion gap: 15 (ref 5–15)
BUN: 20 mg/dL (ref 6–20)
CALCIUM: 9.1 mg/dL (ref 8.9–10.3)
CHLORIDE: 102 mmol/L (ref 101–111)
CO2: 20 mmol/L — ABNORMAL LOW (ref 22–32)
CREATININE: 0.75 mg/dL (ref 0.44–1.00)
GFR calc Af Amer: >60 mL/min (ref >60)
GFR calc non Af Amer: >60 mL/min (ref >60)
Glucose, Bld: 131 mg/dL — ABNORMAL HIGH (ref 65–99)
Potassium: 3.3 mmol/L — ABNORMAL LOW (ref 3.5–5.1)
Sodium: 137 mmol/L (ref 135–145)

## 2017-01-21 LAB — CBC WITH DIFFERENTIAL/PLATELET
BASOS PCT: 0 %
Basophils Absolute: 0 10*3/uL (ref 0.0–0.1)
EOS ABS: 0.2 10*3/uL (ref 0.0–0.7)
EOS PCT: 2 %
HCT: 35.7 % — ABNORMAL LOW (ref 36.0–46.0)
Hemoglobin: 11.6 g/dL — ABNORMAL LOW (ref 12.0–15.0)
LYMPHS ABS: 1.4 10*3/uL (ref 0.7–4.0)
Lymphocytes Relative: 15 %
MCH: 27.2 pg (ref 26.0–34.0)
MCHC: 32.5 g/dL (ref 30.0–36.0)
MCV: 83.8 fL (ref 78.0–100.0)
MONOS PCT: 8 %
Monocytes Absolute: 0.8 10*3/uL (ref 0.1–1.0)
Neutro Abs: 6.9 10*3/uL (ref 1.7–7.7)
Neutrophils Relative %: 75 %
PLATELETS: 313 10*3/uL (ref 150–400)
RBC: 4.26 MIL/uL (ref 3.87–5.11)
RDW: 15.2 % (ref 11.5–15.5)
WBC: 9.3 10*3/uL (ref 4.0–10.5)

## 2017-01-21 LAB — I-STAT TROPONIN, ED: TROPONIN I, POC: 0 ng/mL (ref 0.00–0.08)

## 2017-01-21 MED ORDER — SODIUM CHLORIDE 0.9% FLUSH: 3.0000 mL | INTRAVENOUS | Status: DC | PRN

## 2017-01-21 MED ORDER — LOSARTAN POTASSIUM 50 MG PO TABS
25.0000 mg | ORAL_TABLET | Freq: Every day | ORAL | Status: DC
  Administered 2017-01-21 – 2017-01-22 (×2): 25 mg via ORAL
  Filled 2017-01-21 (×2): qty 1

## 2017-01-21 MED ORDER — POTASSIUM CHLORIDE CRYS ER 20 MEQ PO TBCR
40.0000 meq | EXTENDED_RELEASE_TABLET | ORAL | Status: AC
  Administered 2017-01-21: 40 meq via ORAL
  Filled 2017-01-21: qty 2

## 2017-01-21 MED ORDER — FUROSEMIDE 10 MG/ML IJ SOLN
40.0000 mg | Freq: Once | INTRAMUSCULAR | Status: AC
  Administered 2017-01-21: 40 mg via INTRAVENOUS
  Filled 2017-01-21: qty 4

## 2017-01-21 MED ORDER — DILTIAZEM HCL 60 MG PO TABS
60.0000 mg | ORAL_TABLET | Freq: Three times a day (TID) | ORAL | Status: DC
  Administered 2017-01-21 – 2017-01-22 (×3): 60 mg via ORAL
  Filled 2017-01-21 (×3): qty 1

## 2017-01-21 MED ORDER — GUAIFENESIN-DM 100-10 MG/5ML PO SYRP
15.0000 mL | ORAL_SOLUTION | ORAL | Status: DC | PRN
  Administered 2017-01-21: 15 mL via ORAL
  Filled 2017-01-21: qty 15

## 2017-01-21 MED ORDER — METOPROLOL SUCCINATE ER 25 MG PO TB24: 25.0000 mg | ORAL_TABLET | Freq: Two times a day (BID) | ORAL | Status: DC

## 2017-01-21 MED ORDER — FUROSEMIDE 10 MG/ML IJ SOLN: 40.0000 mg | Freq: Every day | INTRAMUSCULAR | Status: DC

## 2017-01-21 MED ORDER — SODIUM CHLORIDE 0.9 % IV SOLN: 250.0000 mL | INTRAVENOUS | Status: DC | PRN

## 2017-01-21 MED ORDER — POTASSIUM CHLORIDE CRYS ER 20 MEQ PO TBCR: 20.0000 meq | EXTENDED_RELEASE_TABLET | Freq: Every day | ORAL | Status: DC

## 2017-01-21 MED ORDER — SODIUM CHLORIDE 0.9% FLUSH
3.0000 mL | Freq: Two times a day (BID) | INTRAVENOUS | Status: DC
  Administered 2017-01-21 – 2017-01-22 (×3): 3 mL via INTRAVENOUS

## 2017-01-21 MED ORDER — ACETAMINOPHEN 325 MG PO TABS: 650.0000 mg | ORAL_TABLET | ORAL | Status: DC | PRN

## 2017-01-21 MED ORDER — ATORVASTATIN CALCIUM 20 MG PO TABS
20.0000 mg | ORAL_TABLET | Freq: Every day | ORAL | Status: DC
  Administered 2017-01-21: 20 mg via ORAL
  Filled 2017-01-21: qty 1

## 2017-01-21 MED ORDER — ONDANSETRON HCL 4 MG/2ML IJ SOLN: 4.0000 mg | Freq: Four times a day (QID) | INTRAMUSCULAR | Status: DC | PRN

## 2017-01-21 MED ORDER — TRIAMTERENE-HCTZ 37.5-25 MG PO TABS: 0.5000 | ORAL_TABLET | Freq: Every day | ORAL | Status: DC

## 2017-01-21 MED ORDER — RIVAROXABAN 15 MG PO TABS
15.0000 mg | ORAL_TABLET | Freq: Every day | ORAL | Status: DC
  Administered 2017-01-21: 15 mg via ORAL
  Filled 2017-01-21: qty 1

## 2017-01-21 MED ORDER — ASPIRIN EC 81 MG PO TBEC
81.0000 mg | DELAYED_RELEASE_TABLET | Freq: Every day | ORAL | Status: DC
  Administered 2017-01-22: 81 mg via ORAL
  Filled 2017-01-21: qty 1

## 2017-01-21 MED ORDER — METOPROLOL SUCCINATE ER 25 MG PO TB24
25.0000 mg | ORAL_TABLET | Freq: Every day | ORAL | Status: DC
  Administered 2017-01-22: 25 mg via ORAL
  Filled 2017-01-21: qty 1

## 2017-01-21 NOTE — H&P (Signed)
History and Physical    [name] ZOX:[id number] DOB: Aug 07, 1930 DOA: 01/21/2017    PCP: Rory Percy, MD  Patient coming from: home  Chief Complaint: palpitations and weakness  HPI: [name] is a 84 y.o. female with medical history of A-fib, systolic CHF, HTN who was in the hospital from 12/26 through 12/27 for A-fib with RVR. She states that she has been feeling palpitations and severe fatigue since she left the hospital. She has no had any chest pain but occasionally has felt a band around her upper stomach with feels like a tightness at times. She is noted to have A-fib with HR in 110-120s in the ER. She states she has been taking he medications as ordered.   ED Course: HR as mentioned above CXR shows vascular congestion.  She has received IV Lasix 40 mg.   Review of Systems:  Sinus drainage from nose  All other systems reviewed and apart from HPI, are negative.  Past Medical History:  Diagnosis Date  . Arthritis   . Atrial flutter ([place])   . Coronary atherosclerosis of native coronary artery    BMS RCA May 2014 - [place] stent  . Depression    hx of  . Dysrhythmia   . Essential hypertension, benign   . Hyperlipemia   . Macular degeneration   . Paroxysmal atrial fibrillation (HCC)   . Secondary cardiomyopathy (HCC)    LVEF 35% - improved to 45-50%  . Ulcerative colitis     Past Surgical History:  Procedure Laterality Date  . ABDOMINAL HYSTERECTOMY    . APPENDECTOMY    . Bilateral knee replacements      2007, 2008  . BLADDER SURGERY    . COLONOSCOPY N/A 06/15/2015   Procedure: COLONOSCOPY;  Surgeon: Rogene Houston, MD;  Location: [facility]ITE;  Service: Endoscopy;  Laterality: N/A;  210  . CYSTOSCOPY W/ RETROGRADES Bilateral 05/14/2016   Procedure: CYSTOSCOPY WITH BILATERAL RETROGRADE PYELOGRAM;  Surgeon: Raynelle Bring, MD;  Location: [facility] ORS;  Service: Urology;  Laterality: Bilateral;  GENERAL ANESTHESIA WITH PARALYSIS  . LEFT HEART  CATHETERIZATION WITH CORONARY ANGIOGRAM N/A 10/30/2013   Procedure: LEFT HEART CATHETERIZATION WITH CORONARY ANGIOGRAM;  Surgeon: Burnell Blanks, MD;  Location: [facility] CATH LAB;  Service: Cardiovascular;  Laterality: N/A;  . TONSILLECTOMY    . TOTAL KNEE ARTHROPLASTY    . TRANSURETHRAL RESECTION OF BLADDER TUMOR N/A 05/14/2016   Procedure: TRANSURETHRAL RESECTION OF BLADDER TUMOR (TURBT);  Surgeon: Raynelle Bring, MD;  Location: [facility] ORS;  Service: Urology;  Laterality: N/A;  GENERAL ANESTHESIA WITH PARALYSIS  . YAG LASER APPLICATION Bilateral 81/01/9145   Procedure: YAG LASER APPLICATION;  Surgeon: Williams Che, MD;  Location: [facility] ORS;  Service: Ophthalmology;  Laterality: Bilateral;    Social History:   reports that  has never smoked. she has never used smokeless tobacco. She reports that she does not drink alcohol or use drugs.  Allergies  Allergen Reactions  . Sinus & Allergy [Chlorpheniramine-Phenylephrine] Shortness Of Breath  . Demerol Rash  . Penicillins Rash and Other (See Comments)    REACTION: rash, years ago Has patient had a PCN reaction causing immediate rash, facial/tongue/throat swelling, SOB or lightheadedness with hypotension: Yes Has patient had a PCN reaction causing severe rash involving mucus membranes or skin necrosis: No Has patient had a PCN reaction that required hospitalization No Has patient had a PCN reaction occurring within the last 10 years: No If all of the above answers are "NO",  then may proceed with Cephalosporin use.     History reviewed. No pertinent family history.   Prior to Admission medications   Medication Sig Start Date End Date Taking? Authorizing Provider  acetaminophen (TYLENOL ARTHRITIS PAIN) 650 MG CR tablet Take 1,300 mg by mouth every 8 (eight) hours.    Yes [provider]  aspirin 81 MG tablet Take 1 tablet (81 mg total) by mouth daily. 06/17/15  Yes Rehman, Mechele Dawley, MD  atorvastatin (LIPITOR) 20 MG tablet TAKE ONE  TABLET BY MOUTH AT BEDTIME 10/08/16  Yes Satira Sark, MD  balsalazide (COLAZAL) 750 MG capsule TAKE THREE CAPSULES BY MOUTH THREE TIMES DAILY (REPLACING DELZICOL) 12/09/16  Yes Rehman, Mechele Dawley, MD  furosemide (LASIX) 40 MG tablet Take 1 tablet (40 mg total) by mouth daily. 01/07/17  Yes Satira Sark, MD  ketotifen [facility] ALLERGY) 0.025 % ophthalmic solution Place 2 drops into both eyes 3 (three) times daily as needed (for dry eyes).    Yes [provider]  losartan (COZAAR) 25 MG tablet TAKE 1 TABLET BY MOUTH EVERY DAY 02/20/16  Yes Satira Sark, MD  metoprolol succinate (TOPROL-XL) 25 MG 24 hr tablet Take 1 tablet (25 mg total) by mouth 2 (two) times daily. 01/03/17  Yes Kathie Dike, MD  Multiple Vitamins-Minerals (ICAPS AREDS 2 PO) Take 2 tablets by mouth at bedtime.    Yes [provider]  nitroGLYCERIN (NITROSTAT) 0.4 MG SL tablet Place 1 tablet (0.4 mg total) under the tongue every 5 (five) minutes as needed for chest pain. 02/28/16 01/21/17 Yes Satira Sark, MD  potassium chloride SA (K-DUR,KLOR-CON) 20 MEQ tablet Take 1 tablet (20 mEq total) by mouth daily. 01/07/17  Yes Satira Sark, MD  XARELTO 15 MG TABS tablet TAKE ONE TABLET BY MOUTH DAILY 10/08/16  Yes Satira Sark, MD  triamterene-hydrochlorothiazide [facility]) 37.5-25 MG tablet TAKE 1/2 TABLET BY MOUTH DAILY Patient not taking: Reported on 01/21/2017 12/10/16   Satira Sark, MD    Physical Exam: Wt Readings from Last 3 Encounters:  01/03/17 106 kg (233 lb 11 oz)  05/19/16 101.2 kg (223 lb)  05/14/16 101.2 kg (223 lb)   Vitals:   01/21/17 0936  BP: 128/85  Pulse: (!) 115  Resp: (!) 22  Temp: 97.9 F (36.6 C)  TempSrc: Oral  SpO2: 94%      Constitutional: NAD, calm, comfortable Eyes: PERTLA, lids and conjunctivae normal ENMT: Mucous membranes are moist. Posterior pharynx clear of any exudate or lesions. Normal dentition.  Neck: normal, supple, no masses,  no thyromegaly Respiratory:  Mild crackles at bases, no wheezing, no crackles. Normal respiratory effort. No accessory muscle use.  Cardiovascular: S1 & S2 heard, IIRR, no murmurs / rubs / gallops. No extremity edema. 2+ pedal pulses. No carotid bruits.  Abdomen: No distension, no tenderness, no masses palpated. No hepatosplenomegaly. Bowel sounds normal.  Musculoskeletal: no clubbing / cyanosis. No joint deformity upper and lower extremities. Good ROM, no contractures. Normal muscle tone.  Skin: no rashes, lesions, ulcers. No induration Neurologic: CN 2-12 grossly intact. Sensation intact, DTR normal. Strength 5/5 in all 4 limbs.  Psychiatric: Normal judgment and insight. Alert and oriented x 3. Normal mood.     Labs on Admission: I have personally reviewed following labs and imaging studies  CBC: Recent Labs  Lab 01/21/17 0940  WBC 9.3  NEUTROABS 6.9  HGB 11.6*  HCT 35.7*  MCV 83.8  PLT 532   Basic Metabolic Panel: Recent  Labs  Lab 01/21/17 0940  NA 137  K 3.3*  CL 102  CO2 20*  GLUCOSE 131*  BUN 20  CREATININE 0.75  CALCIUM 9.1   GFR: CrCl cannot be calculated (Unknown ideal weight.). Liver Function Tests: No results for input(s): AST, ALT, ALKPHOS, BILITOT, PROT, ALBUMIN in the last 168 hours. No results for input(s): LIPASE, AMYLASE in the last 168 hours. No results for input(s): AMMONIA in the last 168 hours. Coagulation Profile: No results for input(s): INR, PROTIME in the last 168 hours. Cardiac Enzymes: No results for input(s): CKTOTAL, CKMB, CKMBINDEX, TROPONINI in the last 168 hours. BNP (last 3 results) No results for input(s): PROBNP in the last 8760 hours. HbA1C: No results for input(s): HGBA1C in the last 72 hours. CBG: No results for input(s): GLUCAP in the last 168 hours. Lipid Profile: No results for input(s): CHOL, HDL, LDLCALC, TRIG, CHOLHDL, LDLDIRECT in the last 72 hours. Thyroid Function Tests: No results for input(s): TSH, T4TOTAL,  FREET4, T3FREE, THYROIDAB in the last 72 hours. Anemia Panel: No results for input(s): VITAMINB12, FOLATE, FERRITIN, TIBC, IRON, RETICCTPCT in the last 72 hours. Urine analysis:    Component Value Date/Time   COLORURINE STRAW (A) 01/02/2017 1420   APPEARANCEUR CLEAR 01/02/2017 1420   LABSPEC 1.008 01/02/2017 1420   PHURINE 7.0 01/02/2017 1420   GLUCOSEU NEGATIVE 01/02/2017 1420   HGBUR NEGATIVE 01/02/2017 1420   BILIRUBINUR NEGATIVE 01/02/2017 1420   KETONESUR NEGATIVE 01/02/2017 1420   PROTEINUR NEGATIVE 01/02/2017 1420   NITRITE NEGATIVE 01/02/2017 1420   LEUKOCYTESUR NEGATIVE 01/02/2017 1420   Sepsis Labs: @LABRCNTIP (procalcitonin:4,lacticidven:4) )No results found for this or any previous visit (from the past 240 hour(s)).   Radiological Exams on Admission: Dg Chest 2 View  Result Date: 01/21/2017 CLINICAL DATA:  Shortness of breath and generalized weakness associated with shortness of breath. History of atrial flutter, hypertension, coronary artery disease. EXAM: CHEST  2 VIEW COMPARISON:  Chest x-ray of January 02, 2017 FINDINGS: The lungs are adequately inflated. The interstitial markings are increased diffusely. The pulmonary vascularity is engorged. The cardiac silhouette remains enlarged. No alveolar infiltrates or edema is observed. There is no pleural effusion. There are degenerative changes of both shoulders. IMPRESSION: CHF with interstitial edema. No discrete pneumonia. The appearance of the chest has worsened since the previous study. Electronically Signed   By: David  Martinique M.D.   On: 01/21/2017 10:07    EKG: Independently reviewed. A-fib with RVR at 129  Assessment/Plan Principal Problem:   Acute systolic CHF (congestive heart failure)  - Lasix IV given in ER- will continue- exacerbation may be due to uncontrolled HR- follow - last ECHO in 12/27 showed that her EF had declined drom 40-45% to 30-35%  Active Problems: Atrial fibrillation with RVR ([place]) - med  rec states that she has been taking Toprol XL 25 mg BID- I have changed this to once a day as it is meant to be given - I have added oral Cardizem to further help control HR    Essential hypertension, benign - cont Cozaar, lasix Metoprolol and Cardizem  Upper abdominal pain - ? If this is an anginal equivalent in relation rapid Heart rates- will continue to follow for recurrence    Hypokalemia - replacing         DVT prophylaxis: Xarelto Code Status: Full code  Family Communication:   Disposition Plan: admit to telemetry  Consults called: none  Admission status: inpatien    Debbe Odea MD [facility] Pager: [email or web address]  Password [email or web address] 7PM-7AM, please contact night-coverage   01/21/2017, 1:55 PM

## 2017-01-21 NOTE — Care Management Note (Signed)
Case Management Note  Patient Details  Name: [name] MRN: [id number] Date of Birth: [birth year]  Subjective/Objective:    Admitted with CHF/afib. Pt is from home, lives with husband. She is ind with ADL's. shes uses 4-point cane as needed. She has PCP, transportation and insurance with drug coverage. She is compliant with diet and medications. She has scale. She weighs herself daily. She is not interested in [facility] at this time.                 Action/Plan: DC home with self care. No CM needs noted at this time. May be re-consulted if needs arise.   Expected Discharge Date:       01/25/2016           Expected Discharge Plan:  Home/Self Care  In-House Referral:  NA  Discharge planning Services  CM Consult  Post Acute Care Choice:  NA Choice offered to:  NA  Status of Service:  Completed, signed off  Sherald Barge, RN 01/21/2017, 3:47 PM

## 2017-01-21 NOTE — ED Triage Notes (Signed)
Pt c/o sob/gen weakness and holds her hands out and pt shaking. Mild sob noted. Pt able to changed clotehs and get from w/c to stretcher but sob noticed. A/o. States checked hr this am and was 140

## 2017-01-21 NOTE — ED Provider Notes (Signed)
[facility] EMERGENCY DEPARTMENT Provider Note   CSN: [id number] Arrival date & time: 01/21/17  0938     History   Chief Complaint Chief Complaint  Patient presents with  . Shortness of Breath  . Weakness    HPI [name] is a 84 y.o. female.  Dyspnea and generalized malaise for several days.  Patient becomes dyspneic with any exertion.  Past medical history includes CAD, atrial fibrillation, hypertension, hyperlipidemia, cardiomyopathy, ulcerative colitis.  No substernal chest pain, fever, sweats, chills, cough, dysuria.  Severity of symptoms is moderate.      Past Medical History:  Diagnosis Date  . Arthritis   . Atrial flutter ([place])   . Coronary atherosclerosis of native coronary artery    BMS RCA May 2014 - [place] stent  . Depression    hx of  . Dysrhythmia   . Essential hypertension, benign   . Hyperlipemia   . Macular degeneration   . Paroxysmal atrial fibrillation (HCC)   . Secondary cardiomyopathy (HCC)    LVEF 35% - improved to 45-50%  . Ulcerative colitis     Patient Active Problem List   Diagnosis Date Noted  . Chronic systolic dysfunction of left ventricle 01/03/2017  . Atrial fibrillation with RVR ([place]) 01/02/2017  . Arthritis of midfoot 01/13/2016  . Right shoulder pain 01/13/2016  . Ulcerative colitis ([place]) 05/03/2014  . Bilateral leg edema 11/30/2013  . Abnormal myocardial perfusion study 09/17/2013  . Coronary atherosclerosis of native coronary artery 09/03/2012  . Secondary cardiomyopathy ([place]) 09/03/2012  . Paroxysmal atrial fibrillation ([place]) 03/12/2012  . Mixed hyperlipidemia 01/21/2009  . Essential hypertension, benign 01/21/2009  . ATRIAL FLUTTER 10/06/2008    Past Surgical History:  Procedure Laterality Date  . ABDOMINAL HYSTERECTOMY    . APPENDECTOMY    . Bilateral knee replacements      2007, 2008  . BLADDER SURGERY    . COLONOSCOPY N/A 06/15/2015   Procedure: COLONOSCOPY;  Surgeon: Rogene Houston, MD;  Location:  [facility];  Service: Endoscopy;  Laterality: N/A;  210  . CYSTOSCOPY W/ RETROGRADES Bilateral 05/14/2016   Procedure: CYSTOSCOPY WITH BILATERAL RETROGRADE PYELOGRAM;  Surgeon: Raynelle Bring, MD;  Location: [facility] ORS;  Service: Urology;  Laterality: Bilateral;  GENERAL ANESTHESIA WITH PARALYSIS  . LEFT HEART CATHETERIZATION WITH CORONARY ANGIOGRAM N/A 10/30/2013   Procedure: LEFT HEART CATHETERIZATION WITH CORONARY ANGIOGRAM;  Surgeon: Burnell Blanks, MD;  Location: [facility] CATH LAB;  Service: Cardiovascular;  Laterality: N/A;  . TONSILLECTOMY    . TOTAL KNEE ARTHROPLASTY    . TRANSURETHRAL RESECTION OF BLADDER TUMOR N/A 05/14/2016   Procedure: TRANSURETHRAL RESECTION OF BLADDER TUMOR (TURBT);  Surgeon: Raynelle Bring, MD;  Location: [facility] ORS;  Service: Urology;  Laterality: N/A;  GENERAL ANESTHESIA WITH PARALYSIS  . YAG LASER APPLICATION Bilateral 18/02/9935   Procedure: YAG LASER APPLICATION;  Surgeon: Williams Che, MD;  Location: [facility] ORS;  Service: Ophthalmology;  Laterality: Bilateral;    OB History    No data available       Home Medications    Prior to Admission medications   Medication Sig Start Date End Date Taking? Authorizing Provider  acetaminophen (TYLENOL ARTHRITIS PAIN) 650 MG CR tablet Take 1,300 mg by mouth every 8 (eight) hours.    Yes [provider]  aspirin 81 MG tablet Take 1 tablet (81 mg total) by mouth daily. 06/17/15  Yes Rehman, Mechele Dawley, MD  atorvastatin (LIPITOR) 20 MG tablet TAKE ONE TABLET BY MOUTH AT BEDTIME  10/08/16  Yes Satira Sark, MD  balsalazide (COLAZAL) 750 MG capsule TAKE THREE CAPSULES BY MOUTH THREE TIMES DAILY (REPLACING DELZICOL) 12/09/16  Yes Rehman, Mechele Dawley, MD  furosemide (LASIX) 40 MG tablet Take 1 tablet (40 mg total) by mouth daily. 01/07/17  Yes Satira Sark, MD  ketotifen [facility] ALLERGY) 0.025 % ophthalmic solution Place 2 drops into both eyes 3 (three) times daily as needed (for dry eyes).    Yes [provider]  losartan (COZAAR) 25 MG tablet TAKE 1 TABLET BY MOUTH EVERY DAY 02/20/16  Yes Satira Sark, MD  metoprolol succinate (TOPROL-XL) 25 MG 24 hr tablet Take 1 tablet (25 mg total) by mouth 2 (two) times daily. 01/03/17  Yes Kathie Dike, MD  Multiple Vitamins-Minerals (ICAPS AREDS 2 PO) Take 2 tablets by mouth at bedtime.    Yes [provider]  nitroGLYCERIN (NITROSTAT) 0.4 MG SL tablet Place 1 tablet (0.4 mg total) under the tongue every 5 (five) minutes as needed for chest pain. 02/28/16 01/21/17 Yes Satira Sark, MD  potassium chloride SA (K-DUR,KLOR-CON) 20 MEQ tablet Take 1 tablet (20 mEq total) by mouth daily. 01/07/17  Yes Satira Sark, MD  XARELTO 15 MG TABS tablet TAKE ONE TABLET BY MOUTH DAILY 10/08/16  Yes Satira Sark, MD  triamterene-hydrochlorothiazide [facility]) 37.5-25 MG tablet TAKE 1/2 TABLET BY MOUTH DAILY Patient not taking: Reported on 01/21/2017 12/10/16   Satira Sark, MD    Family History History reviewed. No pertinent family history.  Social History Social History   Tobacco Use  . Smoking status: Never Smoker  . Smokeless tobacco: Never Used  Substance Use Topics  . Alcohol use: No    Alcohol/week: 0.0 oz  . Drug use: No     Allergies   Sinus & allergy [chlorpheniramine-phenylephrine]; Demerol; and Penicillins   Review of Systems Review of Systems  All other systems reviewed and are negative.    Physical Exam Updated Vital Signs BP 128/85   Pulse (!) 115   Temp 97.9 F (36.6 C) (Oral)   Resp (!) 22   SpO2 94%   Physical Exam  Constitutional: She is oriented to person, place, and time.  Overweight, min distress  HENT:  Head: Normocephalic and atraumatic.  Eyes: Conjunctivae are normal.  Neck: Neck supple.  Cardiovascular: Normal rate and regular rhythm.  Pulmonary/Chest: Effort normal and breath sounds normal.  Abdominal: Soft. Bowel sounds are normal.  Musculoskeletal: Normal range of  motion.  Neurological: She is alert and oriented to person, place, and time.  Skin: Skin is warm and dry.  Psychiatric: She has a normal mood and affect. Her behavior is normal.  Nursing note and vitals reviewed.    ED Treatments / Results  Labs (all labs ordered are listed, but only abnormal results are displayed) Labs Reviewed  BASIC METABOLIC PANEL - Abnormal; Notable for the following components:      Result Value   Potassium 3.3 (*)    CO2 20 (*)    Glucose, Bld 131 (*)    All other components within normal limits  CBC WITH DIFFERENTIAL/PLATELET - Abnormal; Notable for the following components:   Hemoglobin 11.6 (*)    HCT 35.7 (*)    All other components within normal limits  I-STAT TROPONIN, ED    EKG  EKG Interpretation  Date/Time:  Monday January 21 2017 09:33:32 EST Ventricular Rate:  129 PR Interval:    QRS Duration: 161 QT Interval:  344  QTC Calculation: 553 R Axis:   -118 Text Interpretation:  Extreme tachycardia with wide complex, no further rhythm analysis attempted Confirmed by Nat Christen [id number]) on 01/21/2017 11:20:11 AM       Radiology Dg Chest 2 View  Result Date: 01/21/2017 CLINICAL DATA:  Shortness of breath and generalized weakness associated with shortness of breath. History of atrial flutter, hypertension, coronary artery disease. EXAM: CHEST  2 VIEW COMPARISON:  Chest x-ray of January 02, 2017 FINDINGS: The lungs are adequately inflated. The interstitial markings are increased diffusely. The pulmonary vascularity is engorged. The cardiac silhouette remains enlarged. No alveolar infiltrates or edema is observed. There is no pleural effusion. There are degenerative changes of both shoulders. IMPRESSION: CHF with interstitial edema. No discrete pneumonia. The appearance of the chest has worsened since the previous study. Electronically Signed   By: David  Martinique M.D.   On: 01/21/2017 10:07    Procedures Procedures (including critical care  time)  Medications Ordered in ED Medications  furosemide (LASIX) injection 40 mg (40 mg Intravenous Given 01/21/17 1220)     Initial Impression / Assessment and Plan / ED Course  I have reviewed the triage vital signs and the nursing notes.  Pertinent labs & imaging results that were available during my care of the patient were reviewed by me and considered in my medical decision making (see chart for details).     Patient presents with dyspnea and general malaise.  EKG shows atrial fibrillation with a variable rate.  Troponin negative.  Chest x-ray reveals pulmonary edema.  Lasix 40 mg IV.  Admit to general medicine.  Final Clinical Impressions(s) / ED Diagnoses   Final diagnoses:  Congestive heart failure, unspecified HF chronicity, unspecified heart failure type [facility])    ED Discharge Orders    None       Nat Christen, MD 01/21/17 1357

## 2017-01-21 NOTE — Progress Notes (Signed)
Pt c/o cough, white and thin. Robitussin ordered per Cardiology PRN medications. Will give and continue to monitor pt.

## 2017-01-22 DIAGNOSIS — K5100 Ulcerative (chronic) pancolitis without complications: Secondary | ICD-10-CM

## 2017-01-22 DIAGNOSIS — R29898 Other symptoms and signs involving the musculoskeletal system: Secondary | ICD-10-CM

## 2017-01-22 LAB — BASIC METABOLIC PANEL
Anion gap: 12 (ref 5–15)
BUN: 22 mg/dL — ABNORMAL HIGH (ref 6–20)
CO2: 24 mmol/L (ref 22–32)
Calcium: 9.2 mg/dL (ref 8.9–10.3)
Chloride: 103 mmol/L (ref 101–111)
Creatinine, Ser: 0.79 mg/dL (ref 0.44–1.00)
Glucose, Bld: 106 mg/dL — ABNORMAL HIGH (ref 65–99)
POTASSIUM: 4 mmol/L (ref 3.5–5.1)
SODIUM: 139 mmol/L (ref 135–145)

## 2017-01-22 MED ORDER — BALSALAZIDE DISODIUM 750 MG PO CAPS: 770.0000 mg | ORAL_CAPSULE | Freq: Three times a day (TID) | ORAL | Status: DC

## 2017-01-22 MED ORDER — BALSALAZIDE DISODIUM 750 MG PO CAPS
2250.0000 mg | ORAL_CAPSULE | Freq: Three times a day (TID) | ORAL | Status: DC
  Administered 2017-01-22: 2250 mg via ORAL
  Filled 2017-01-22: qty 3

## 2017-01-22 MED ORDER — METOPROLOL SUCCINATE ER 25 MG PO TB24: 25.0000 mg | ORAL_TABLET | Freq: Every day | ORAL | 3 refills | Status: DC

## 2017-01-22 MED ORDER — DILTIAZEM HCL ER COATED BEADS 120 MG PO CP24
120.0000 mg | ORAL_CAPSULE | Freq: Every day | ORAL | Status: DC
  Administered 2017-01-22: 120 mg via ORAL
  Filled 2017-01-22: qty 1

## 2017-01-22 MED ORDER — DILTIAZEM HCL ER COATED BEADS 120 MG PO CP24: 120.0000 mg | ORAL_CAPSULE | Freq: Every day | ORAL | 0 refills | Status: DC

## 2017-01-22 NOTE — Progress Notes (Signed)
CCMD called and stated that pt had a 2.09 second pause. Pt is in Afib/hx afib (chronic-pt on Xarelto). Dr. Darrick Meigs made aware. No new orders at this time.

## 2017-01-22 NOTE — Evaluation (Addendum)
Physical Therapy Evaluation Patient Details Name: [name] MRN: [id number] DOB: 22-Jun-1930 Today's Date: 01/22/2017   History of Present Illness  [name] is a 84 y.o. female with medical history of A-fib, systolic CHF, HTN who was in the hospital from 12/26 through 12/27 for A-fib with RVR. She states that she has been feeling palpitations and severe fatigue since she left the hospital. She has no had any chest pain but occasionally has felt a band around her upper stomach with feels like a tightness at times. She is noted to have A-fib with HR in 110-120s in the ER. She states she has been taking he medications as ordered.     Clinical Impression  Patient limited for functional mobility as stated below secondary to BLE weakness, fatigue and fair standing balance.  Patient will benefit from continued physical therapy in hospital and recommended venue below to increase strength, balance, endurance for safe ADLs and gait.    Follow Up Recommendations Home health PT;Supervision for mobility/OOB    Equipment Recommendations  None recommended by PT    Recommendations for Other Services       Precautions / Restrictions Precautions Precautions: Fall Restrictions Weight Bearing Restrictions: No      Mobility  Bed Mobility Overal bed mobility: Modified Independent             General bed mobility comments: using siderail  Transfers Overall transfer level: Needs assistance Equipment used: Straight cane Transfers: Sit to/from Stand;Stand Pivot Transfers Sit to Stand: Supervision Stand pivot transfers: Supervision          Ambulation/Gait Ambulation/Gait assistance: Supervision Ambulation Distance (Feet): 45 Feet Assistive device: Straight cane Gait Pattern/deviations: Step-through pattern;Decreased step length - right;Decreased step length - left;Decreased stride length   Gait velocity interpretation: Below normal speed for age/gender General Gait  Details: demonstrates mostly 2 point gait pattern wtihout loss of balance using SPC, limited secondary to c/o fatigue, on room air with O2 sats at 97%, HR stabilizing at 115 BPM after dropping to 41 BPM possibly due to A-FIB  Stairs            Wheelchair Mobility    Modified Rankin (Stroke Patients Only)       Balance Overall balance assessment: Needs assistance Sitting-balance support: No upper extremity supported;Feet supported Sitting balance-Leahy Scale: Good     Standing balance support: Single extremity supported;During functional activity Standing balance-Leahy Scale: Fair Standing balance comment: fair/good                             Pertinent Vitals/Pain Pain Assessment: No/denies pain    Home Living Family/patient expects to be discharged to:: Private residence Living Arrangements: Spouse/significant other Available Help at Discharge: Family Type of Home: House Home Access: Stairs to enter Entrance Stairs-Rails: Right Entrance Stairs-Number of Steps: 2 Home Layout: One level Home Equipment: Walker - 2 wheels;Cane - quad;Grab bars - tub/shower;Wheelchair - manual;Bedside commode(wheelchair at home to big for her)      Prior Function Level of Independence: Independent with assistive device(s)         Comments: leans on walls/furniture in house, uses quad cane for longer distance outside the house     Hand Dominance        Extremity/Trunk Assessment   Upper Extremity Assessment Upper Extremity Assessment: Generalized weakness    Lower Extremity Assessment Lower Extremity Assessment: Generalized weakness    Cervical / Trunk Assessment Cervical / Trunk  Assessment: Normal  Communication   Communication: No difficulties  Cognition Arousal/Alertness: Awake/alert Behavior During Therapy: WFL for tasks assessed/performed Overall Cognitive Status: Within Functional Limits for tasks assessed                                         General Comments      Exercises     Assessment/Plan    PT Assessment Patient needs continued PT services  PT Problem List Decreased strength;Decreased activity tolerance;Decreased balance;Decreased mobility       PT Treatment Interventions Gait training;Functional mobility training;Stair training;Therapeutic activities;Therapeutic exercise;Patient/family education    PT Goals (Current goals can be found in the Care Plan section)  Acute Rehab PT Goals PT Goal Formulation: With patient/family Time For Goal Achievement: 01/25/17 Potential to Achieve Goals: Good    Frequency Min 3X/week   Barriers to discharge        Co-evaluation               AM-PAC PT "6 Clicks" Daily Activity  Outcome Measure Difficulty turning over in bed (including adjusting bedclothes, sheets and blankets)?: None Difficulty moving from lying on back to sitting on the side of the bed? : None Difficulty sitting down on and standing up from a chair with arms (e.g., wheelchair, bedside commode, etc,.)?: None Help needed moving to and from a bed to chair (including a wheelchair)?: A Little Help needed walking in hospital room?: A Little Help needed climbing 3-5 steps with a railing? : A Little 6 Click Score: 21    End of Session Equipment Utilized During Treatment: Gait belt Activity Tolerance: Patient tolerated treatment well;Patient limited by fatigue Patient left: in chair;with call bell/phone within reach;with family/visitor present Nurse Communication: Mobility status PT Visit Diagnosis: Unsteadiness on feet (R26.81);Other abnormalities of gait and mobility (R26.89);Muscle weakness (generalized) (M62.81)    Time: 1007-1030 PT Time Calculation (min) (ACUTE ONLY): 23 min   Charges:   PT Evaluation $PT Eval Moderate Complexity: 1 Mod PT Treatments $Therapeutic Activity: 23-37 mins   PT G Codes:        12:11 PM, 04-Feb-2017 Lonell Grandchild, MPT Physical Therapist with  [facility] [phone] office [phone] mobile phone

## 2017-01-22 NOTE — Discharge Summary (Signed)
Physician Discharge Summary  [name] GXQ:[id number] DOB: [birth year] DOA: 01/21/2017  PCP: Rory Percy, MD  Admit date: 01/21/2017 Discharge date: 01/22/2017  Admitted From: home  Disposition:  home   Recommendations for Outpatient Follow-up:  1. F/u on her log of daily weights and her HR  Home Health:  ordered  Equipment/Devices:  none    Discharge Condition:  stable   CODE STATUS:  Full code   Consultations:  none    Discharge Diagnoses:  Principal Problem:   Acute systolic CHF (congestive heart failure) ([place]) Active Problems:   Essential hypertension, benign   Paroxysmal atrial fibrillation (HCC)   Atrial fibrillation with RVR (HCC)    Subjective: She states she feels somewhat stronger today. She has no palpitation any more. No upper abdominal pain either.   Brief Summary: [name] is a 84 y.o. female with medical history of A-fib, systolic CHF, HTN who was in the hospital from 12/26 through 12/27 for A-fib with RVR. She states that she has been feeling palpitations and severe fatigue since she left the hospital. She has no had any chest pain but occasionally has felt a band around her upper stomach with feels like a tightness at times. She is noted to have A-fib with HR in 110-120s in the ER but states it has been as high at 140s at home at rest (uses an wrist watch to monitor HR). She states she has been taking he medications as ordered.   Hospital Course:  Principal Problem:   Acute systolic CHF (congestive heart failure)  - Lasix IV started- exacerbation likely due to uncontrolled HR  - last ECHO in 12/27 showed that her EF had declined drom 40-45% to 30-35% - will resume home dose of Lasix on discharge. With her HR now controlled, I do not expect her to develop CHF-   Active Problems: Atrial fibrillation with RVR ([place]) - med rec states that she has been taking Toprol XL 25 mg BID- I have changed this to once a day as it is meant to be given - I  have added oral Cardizem to further help control HR- HR today in mid 80s at rest and low 100s when ambulating- as she has had a small pause on telemetry, I have decreased her dose of Cardizem today - will d/c home with 120 mg of Cardizem and 20 mg of Toprol- she has an appt with her cardiologist in 2 days    Essential hypertension, benign - cont Cozaar, lasix, Metoprolol and Cardizem  Upper abdominal pain- h/o ulcerative colitis - ? If this is an anginal equivalent in relation rapid Heart rates- no recurrence in the hospital  - cont Balazide which appears to be controlling her colitis symptoms quite well  Hypokalemia - replaced adequately  Severe deconditioning - will be receiving PT at home    Discharge Exam: Vitals:   01/22/17 0418 01/22/17 1000  BP: 108/68   Pulse: 73   Resp: 20   Temp: 98.2 F (36.8 C)   SpO2: 98% 97%   Vitals:   01/21/17 2200 01/22/17 0418 01/22/17 0519 01/22/17 1000  BP:  108/68    Pulse:  73    Resp:  20    Temp:  98.2 F (36.8 C)    TempSrc:  Oral    SpO2: 91% 98%  97%  Weight:   103.9 kg (229 lb 1.6 oz)   Height:        General: Pt is alert, awake, not  in acute distress Cardiovascular: IIRR, S1/S2 +, no rubs, no gallops Respiratory: CTA bilaterally, no wheezing, no rhonchi Abdominal: Soft, NT, ND, bowel sounds + Extremities: no edema, no cyanosis   Discharge Instructions  Discharge Instructions    Diet - low sodium heart healthy   Complete by:  As directed    Increase activity slowly   Complete by:  As directed      Allergies as of 01/22/2017      Reactions   Sinus & Allergy [chlorpheniramine-phenylephrine] Shortness Of Breath   Demerol Rash   Penicillins Rash, Other (See Comments)   REACTION: rash, years ago Has patient had a PCN reaction causing immediate rash, facial/tongue/throat swelling, SOB or lightheadedness with hypotension: Yes Has patient had a PCN reaction causing severe rash involving mucus membranes or skin  necrosis: No Has patient had a PCN reaction that required hospitalization No Has patient had a PCN reaction occurring within the last 10 years: No If all of the above answers are "NO", then may proceed with Cephalosporin use.      Medication List    TAKE these medications   aspirin 81 MG tablet Take 1 tablet (81 mg total) by mouth daily.   atorvastatin 20 MG tablet Commonly known as:  LIPITOR TAKE ONE TABLET BY MOUTH AT BEDTIME   balsalazide 750 MG capsule Commonly known as:  COLAZAL TAKE THREE CAPSULES BY MOUTH THREE TIMES DAILY (REPLACING DELZICOL) What changed:  See the new instructions.   diltiazem 120 MG 24 hr capsule Commonly known as:  CARDIZEM CD Take 1 capsule (120 mg total) by mouth daily. Start taking on:  01/23/2017   furosemide 40 MG tablet Commonly known as:  LASIX Take 1 tablet (40 mg total) by mouth daily.   ICAPS AREDS 2 PO Take 2 tablets by mouth at bedtime.   losartan 25 MG tablet Commonly known as:  COZAAR TAKE 1 TABLET BY MOUTH EVERY DAY   metoprolol succinate 25 MG 24 hr tablet Commonly known as:  TOPROL-XL Take 1 tablet (25 mg total) by mouth daily. What changed:  when to take this   nitroGLYCERIN 0.4 MG SL tablet Commonly known as:  NITROSTAT Place 1 tablet (0.4 mg total) under the tongue every 5 (five) minutes as needed for chest pain.   potassium chloride SA 20 MEQ tablet Commonly known as:  K-DUR,KLOR-CON Take 1 tablet (20 mEq total) by mouth daily.   THERATEARS ALLERGY 0.025 % ophthalmic solution Generic drug:  ketotifen Place 2 drops into both eyes 3 (three) times daily as needed (for dry eyes).   TYLENOL ARTHRITIS PAIN 650 MG CR tablet Generic drug:  acetaminophen Take 1,300 mg by mouth every 8 (eight) hours.   XARELTO 15 MG Tabs tablet Generic drug:  Rivaroxaban TAKE ONE TABLET BY MOUTH DAILY      Follow-up Information    Satira Sark, MD Follow up on 01/25/2017.   Specialty:  Cardiology Why:  Cardiology Follow-Up  on 01/25/2017 at 12:00PM.  Contact information: [place] [address] [phone]          Allergies  Allergen Reactions  . Sinus & Allergy [Chlorpheniramine-Phenylephrine] Shortness Of Breath  . Demerol Rash  . Penicillins Rash and Other (See Comments)    REACTION: rash, years ago Has patient had a PCN reaction causing immediate rash, facial/tongue/throat swelling, SOB or lightheadedness with hypotension: Yes Has patient had a PCN reaction causing severe rash involving mucus membranes or skin necrosis: No Has patient  had a PCN reaction that required hospitalization No Has patient had a PCN reaction occurring within the last 10 years: No If all of the above answers are "NO", then may proceed with Cephalosporin use.      Procedures/Studies:    Dg Chest 2 View  Result Date: 01/21/2017 CLINICAL DATA:  Shortness of breath and generalized weakness associated with shortness of breath. History of atrial flutter, hypertension, coronary artery disease. EXAM: CHEST  2 VIEW COMPARISON:  Chest x-ray of January 02, 2017 FINDINGS: The lungs are adequately inflated. The interstitial markings are increased diffusely. The pulmonary vascularity is engorged. The cardiac silhouette remains enlarged. No alveolar infiltrates or edema is observed. There is no pleural effusion. There are degenerative changes of both shoulders. IMPRESSION: CHF with interstitial edema. No discrete pneumonia. The appearance of the chest has worsened since the previous study. Electronically Signed   By: David  Martinique M.D.   On: 01/21/2017 10:07   Dg Chest 2 View  Result Date: 01/02/2017 CLINICAL DATA:  Weakness.  Atrial fibrillation. EXAM: CHEST  2 VIEW COMPARISON:  10/26/2013. FINDINGS: Mediastinum and hilar structures normal. Cardiomegaly. No pulmonary venous congestion . No focal infiltrate. No pleural effusion or pneumothorax. IMPRESSION: 1. Cardiomegaly.  No pulmonary venous congestion. 2. No focal  infiltrate . Electronically Signed   By: Marcello Moores  Register   On: 01/02/2017 11:42     The results of significant diagnostics from this hospitalization (including imaging, microbiology, ancillary and laboratory) are listed below for reference.     Microbiology: No results found for this or any previous visit (from the past 240 hour(s)).   Labs: BNP (last 3 results) Recent Labs    01/02/17 1102  BNP 174.0*   Basic Metabolic Panel: Recent Labs  Lab 01/21/17 0940 01/22/17 0434  NA 137 139  K 3.3* 4.0  CL 102 103  CO2 20* 24  GLUCOSE 131* 106*  BUN 20 22*  CREATININE 0.75 0.79  CALCIUM 9.1 9.2   Liver Function Tests: No results for input(s): AST, ALT, ALKPHOS, BILITOT, PROT, ALBUMIN in the last 168 hours. No results for input(s): LIPASE, AMYLASE in the last 168 hours. No results for input(s): AMMONIA in the last 168 hours. CBC: Recent Labs  Lab 01/21/17 0940  WBC 9.3  NEUTROABS 6.9  HGB 11.6*  HCT 35.7*  MCV 83.8  PLT 313   Cardiac Enzymes: No results for input(s): CKTOTAL, CKMB, CKMBINDEX, TROPONINI in the last 168 hours. BNP: Invalid input(s): POCBNP CBG: No results for input(s): GLUCAP in the last 168 hours. D-Dimer No results for input(s): DDIMER in the last 72 hours. Hgb A1c No results for input(s): HGBA1C in the last 72 hours. Lipid Profile No results for input(s): CHOL, HDL, LDLCALC, TRIG, CHOLHDL, LDLDIRECT in the last 72 hours. Thyroid function studies No results for input(s): TSH, T4TOTAL, T3FREE, THYROIDAB in the last 72 hours.  Invalid input(s): FREET3 Anemia work up No results for input(s): VITAMINB12, FOLATE, FERRITIN, TIBC, IRON, RETICCTPCT in the last 72 hours. Urinalysis    Component Value Date/Time   COLORURINE STRAW (A) 01/02/2017 1420   APPEARANCEUR CLEAR 01/02/2017 1420   LABSPEC 1.008 01/02/2017 1420   PHURINE 7.0 01/02/2017 1420   GLUCOSEU NEGATIVE 01/02/2017 1420   HGBUR NEGATIVE 01/02/2017 1420   BILIRUBINUR NEGATIVE  01/02/2017 1420   KETONESUR NEGATIVE 01/02/2017 1420   PROTEINUR NEGATIVE 01/02/2017 1420   NITRITE NEGATIVE 01/02/2017 1420   LEUKOCYTESUR NEGATIVE 01/02/2017 1420   Sepsis Labs Invalid input(s): PROCALCITONIN,  WBC,  LACTICIDVEN Microbiology  No results found for this or any previous visit (from the past 240 hour(s)).   Time coordinating discharge: Over 30 minutes  SIGNED:   Debbe Odea, MD  [facility] 01/22/2017, 2:33 PM Pager   If 7PM-7AM, please contact night-coverage [email or web address] Password [email or web address]

## 2017-01-22 NOTE — Plan of Care (Signed)
  Acute Rehab PT Goals(only PT should resolve) Patient Will Transfer Sit To/From Stand 01/22/2017 1213 - Progressing by Lonell Grandchild, PT Flowsheets Taken 01/22/2017 1213  Patient will transfer sit to/from stand with modified independence Pt Will Transfer Bed To Chair/Chair To Bed 01/22/2017 1213 - Progressing by Lonell Grandchild, PT Flowsheets Taken 01/22/2017 1213  Pt will Transfer Bed to Chair/Chair to Bed with modified independence Pt Will Ambulate 01/22/2017 1213 - Progressing by Lonell Grandchild, PT Flowsheets Taken 01/22/2017 1213  Pt will Ambulate 50 feet;with modified independence;with cane  12:13 PM, 01/22/17 Lonell Grandchild, MPT Physical Therapist with [facility] [phone] office [phone] mobile phone

## 2017-01-22 NOTE — Progress Notes (Signed)
Removed IV-clean, dry, intact. Reviewed d/c paperwork with pt and daughter. Wheeled stable pt to car at main entrance.

## 2017-01-22 NOTE — Care Management Note (Signed)
Case Management Note  Patient Details  Name: [name] MRN: [id number] Date of Birth: [birth year]  Subjective/Objective:    Adm with CHF. From home with husband. Ind with ADL's. No DME or HH. Recommended for HH. She is agreeable. Offered choice of [facility]. Believes she has had [facility] (now [facility]) in the past after TKA. Would like them again.                Action/Plan:  DC home with home health. Tim of [facility] notified and will obtain orders from [employer]. Anticipate DC today.    Expected Discharge Date:       01/23/2016           Expected Discharge Plan:  [place]  In-House Referral:  NA  Discharge planning Services  CM Consult  Post Acute Care Choice:  Home Health Choice offered to:  Patient  DME Arranged:    DME Agency:     HH Arranged:  PT, RN, Nurse's Aide [place] Agency:  [facility]  Status of Service:  Completed, signed off  If discussed at [employer] of Stay Meetings, dates discussed:    Additional Comments:  [name], [name], RN 01/22/2017, 12:16 PM

## 2017-01-22 NOTE — Discharge Instructions (Signed)
Please weight yourself daily and keep a log of this. Check you heart rate 2 x a day and keep a log of this as well.  Take this log with you to your cardiologist and PCP at each visit.    Please take all your medications with you for your next visit with your Primary MD. Please request your Primary MD to go over all hospital test results at the follow up. Please ask your Primary MD to get all Hospital records sent to his/her office.  If you experience worsening of your admission symptoms, develop shortness of breath, chest pain, suicidal or homicidal thoughts or a life threatening emergency, you must seek medical attention immediately by calling 911 or calling your MD.  Dennis Bast must read the complete instructions/literature along with all the possible adverse reactions/side effects for all the medicines you take including new medications that have been prescribed to you. Take new medicines after you have completely understood and accpet all the possible adverse reactions/side effects.   Do not drive when taking pain medications or sedatives.    Do not take more than prescribed Pain, Sleep and Anxiety Medications  If you have smoked or chewed Tobacco in the last 2 yrs please stop. Stop any regular alcohol and or recreational drug use.  Wear Seat belts while driving.

## 2017-01-24 ENCOUNTER — Encounter: Payer: Self-pay | Admitting: Cardiology

## 2017-01-24 NOTE — Progress Notes (Signed)
Cardiology Office Note  Date: 01/25/2017   ID: [name], [name] 12-02-1930, MRN [id number]  PCP: Rory Percy, MD  Primary Cardiologist: Rozann Lesches, MD   Chief Complaint  Patient presents with  . Atrial Fibrillation    History of Present Illness: [name] is an 84 y.o. female that I have not seen since February 2018. Record review finds recent hospitalization with discharge on January 15 following management of rapid atrial fibrillation and also acute on chronic systolic heart failure. She was not evaluated by cardiology. [name] She states that right around Christmas time she began to feel more fatigued, reportedly saw Dr. Nadara Mustard and was diagnosed with atrial fibrillation resulting in an increase in her Toprol-XL dose. Since that time she has continued to be fatigued and short of breath with activity, occasionally has a sense of palpitations as well.  She has a history of paroxysmal atrial fibrillation with no recurrent arrhythmia in the last few years. She also has a history of cardiomyopathy with LVEF 35% although improved on medical therapy and rhythm control. Follow-up echocardiogram in December 2018 revealed LVEF back the 30-35% range.  Today we discussed her recurrent atrial fibrillation, possibility that it is contributing to some of her recent symptoms, and also the potential for arranging a cardioversion. She is on Xarelto and at this time and heart rate control is better on a combination of Toprol-XL and Cardizem CD which was just recently started.  Past Medical History:  Diagnosis Date  . Arthritis   . Atrial flutter ([employer])   . Coronary atherosclerosis of native coronary artery    BMS RCA May 2014 - Holcomb stent  . Depression   . Essential hypertension, benign   . Hyperlipemia   . Macular degeneration   . Paroxysmal atrial fibrillation (HCC)   . Secondary cardiomyopathy (HCC)    LVEF 35% - improved to 45-50%  .  Ulcerative colitis     Past Surgical History:  Procedure Laterality Date  . ABDOMINAL HYSTERECTOMY    . APPENDECTOMY    . Bilateral knee replacements      2007, 2008  . BLADDER SURGERY    . COLONOSCOPY N/A 06/15/2015   Procedure: COLONOSCOPY;  Surgeon: Rogene Houston, MD;  Location: [facility];  Service: Endoscopy;  Laterality: N/A;  210  . CYSTOSCOPY W/ RETROGRADES Bilateral 05/14/2016   Procedure: CYSTOSCOPY WITH BILATERAL RETROGRADE PYELOGRAM;  Surgeon: Raynelle Bring, MD;  Location: [facility];  Service: Urology;  Laterality: Bilateral;  GENERAL ANESTHESIA WITH PARALYSIS  . LEFT HEART CATHETERIZATION WITH CORONARY ANGIOGRAM N/A 10/30/2013   Procedure: LEFT HEART CATHETERIZATION WITH CORONARY ANGIOGRAM;  Surgeon: Burnell Blanks, MD;  Location: [facility] CATH LAB;  Service: Cardiovascular;  Laterality: N/A;  . TONSILLECTOMY    . TOTAL KNEE ARTHROPLASTY    . TRANSURETHRAL RESECTION OF BLADDER TUMOR N/A 05/14/2016   Procedure: TRANSURETHRAL RESECTION OF BLADDER TUMOR (TURBT);  Surgeon: Raynelle Bring, MD;  Location: [facility];  Service: Urology;  Laterality: N/A;  GENERAL ANESTHESIA WITH PARALYSIS  . YAG LASER APPLICATION Bilateral 78/02/9560   Procedure: YAG LASER APPLICATION;  Surgeon: Williams Che, MD;  Location: [facility];  Service: Ophthalmology;  Laterality: Bilateral;    Current Outpatient Medications  Medication Sig Dispense Refill  . acetaminophen (TYLENOL ARTHRITIS PAIN) 650 MG CR tablet Take 1,300 mg by mouth every 8 (eight) hours.     Marland Kitchen aspirin 81 MG tablet Take 1 tablet (81 mg total) by  mouth daily.    Marland Kitchen atorvastatin (LIPITOR) 20 MG tablet TAKE ONE TABLET BY MOUTH AT BEDTIME 30 tablet 3  . balsalazide (COLAZAL) 750 MG capsule TAKE THREE CAPSULES BY MOUTH THREE TIMES DAILY (REPLACING DELZICOL) (Patient taking differently: TAKE THREE 750 mg CAPSULES (2256m) BY MOUTH THREE TIMES DAILY (REPLACING DELZICOL)) 270 capsule 3  . diltiazem (CARDIZEM CD) 120 MG 24 hr capsule Take 1 capsule  (120 mg total) by mouth daily. 30 capsule 0  . furosemide (LASIX) 40 MG tablet Take 1 tablet (40 mg total) by mouth daily. 30 tablet 6  . ketotifen (THERATEARS ALLERGY) 0.025 % ophthalmic solution Place 2 drops into both eyes 3 (three) times daily as needed (for dry eyes).     .Marland Kitchenlosartan (COZAAR) 25 MG tablet TAKE 1 TABLET BY MOUTH EVERY DAY 90 tablet 3  . metoprolol succinate (TOPROL-XL) 25 MG 24 hr tablet Take 1 tablet (25 mg total) by mouth daily. 60 tablet 3  . Multiple Vitamins-Minerals (ICAPS AREDS 2 PO) Take 2 tablets by mouth at bedtime.     . nitroGLYCERIN (NITROSTAT) 0.4 MG SL tablet Place 1 tablet (0.4 mg total) under the tongue every 5 (five) minutes as needed for chest pain. 25 tablet 3  . potassium chloride SA (K-DUR,KLOR-CON) 20 MEQ tablet Take 1 tablet (20 mEq total) by mouth daily. 30 tablet 6  . XARELTO 15 MG TABS tablet TAKE ONE TABLET BY MOUTH DAILY 30 tablet 3   No current facility-administered medications for this visit.    Allergies:  Sinus & allergy [chlorpheniramine-phenylephrine]; Demerol; and Penicillins   Social History: The patient  reports that  has never smoked. she has never used smokeless tobacco. She reports that she does not drink alcohol or use drugs.   Family History: The patient's family history is not on file.   ROS:  Please see the history of present illness. Otherwise, complete review of systems is positive for shortness of breath, fatigue, intermittent palpitations, mild leg swelling.  All other systems are reviewed and negative.   Physical Exam: VS:  BP 122/82   Pulse 63   Ht 5' 3"  (1.6 m)   Wt 231 lb (104.8 kg)   SpO2 95%   BMI 40.92 kg/m , BMI Body mass index is 40.92 kg/m.  Wt Readings from Last 3 Encounters:  01/25/17 231 lb (104.8 kg)  01/22/17 229 lb 1.6 oz (103.9 kg)  01/03/17 233 lb 11 oz (106 kg)    General: Elderly woman, no distress. HEENT: Conjunctiva and lids normal, oropharynx clear. Neck: Supple, no elevated JVP or carotid  bruits, no thyromegaly. Lungs: Clear to auscultation, nonlabored breathing at rest. Cardiac: Irregularly irregular, no S3, soft systolic murmur, no pericardial rub. Abdomen: Soft, nontender, bowel sounds present. Extremities: Mild ankle edema, distal pulses 1-2+. Skin: Warm and dry. Musculoskeletal: No kyphosis. Neuropsychiatric: Alert and oriented x3, affect grossly appropriate.  ECG: I personally reviewed the tracing from 01/21/2017 which showed atrial fibrillation with RVR and left bundle branch block.  Recent Labwork: 01/02/2017: ALT 13; AST 22; B Natriuretic Peptide 185.0; TSH 1.970 01/21/2017: Hemoglobin 11.6; Platelets 313 01/22/2017: BUN 22; Creatinine, Ser 0.79; Potassium 4.0; Sodium 139   Other Studies Reviewed Today:  Echocardiogram 01/03/2017: Study Conclusions  - Left ventricle: The cavity size was normal. Wall thickness was   normal. Systolic function was moderately to severely reduced. The   estimated ejection fraction was in the range of 30% to 35%. - Left atrium: The atrium was mildly dilated.  Impressions:  -  Poor acoustic windows limit study Compared to report from 2015   LVEF is depressed.  Assessment and Plan:  1. Persistent atrial fibrillation. Onset not entirely clear but potentially around Christmas when she saw Dr. Nadara Mustard in his office. She is symptomatic with fatigue and shortness of breath, also heart failure symptoms with associated cardiomyopathy which could be tachycardia-mediated based on her history in the past. She was recently managed on the hospitalist team at [facility], cardiology was not consulted. I reviewed her records and we have discussed arranging an elective cardioversion next week to try and get her back in sinus rhythm. She will continue on Xarelto at current dose (CKD stage 2 based on creatinine clearance). Continue Toprol-XL and Cardizem CD, but plan to hold Cardizem CD on the morning of cardioversion. If she continues to manifest  episodes of breakthrough atrial fibrillation, antiarrhythmic therapy or referral to EP will need to be considered.  2. Cardiomyopathy, LVEF down in the 30-35% range by recent echocardiogram. Possibly tachycardia-mediated. This will need to be reassessed once she is back in sinus rhythm and more stable.  3. CAD status post BMS to the RCA in 2014. No active angina symptoms. Troponin I levels negative.  4. Essential hypertension, blood pressure well controlled today.  Current medicines were reviewed with the patient today.  Disposition: Schedule elective cardioversion for next week.  Signed, Satira Sark, MD,  [facility] 01/25/2017 1:11 PM    [facility] [place], [place], [place] [address] Phone: [phone]; Fax: [phone]

## 2017-01-25 ENCOUNTER — Ambulatory Visit: Payer: Medicare Other | Admitting: Cardiology

## 2017-01-25 ENCOUNTER — Encounter: Payer: Self-pay | Admitting: Cardiology

## 2017-01-25 ENCOUNTER — Other Ambulatory Visit: Payer: Self-pay

## 2017-01-25 ENCOUNTER — Other Ambulatory Visit: Payer: Self-pay | Admitting: Cardiology

## 2017-01-25 VITALS — BP 122/82 | HR 63 | Ht 63.0 in | Wt 231.0 lb

## 2017-01-25 DIAGNOSIS — I2510 Atherosclerotic heart disease of native coronary artery without angina pectoris: Secondary | ICD-10-CM | POA: Diagnosis not present

## 2017-01-25 DIAGNOSIS — I429 Cardiomyopathy, unspecified: Secondary | ICD-10-CM | POA: Diagnosis not present

## 2017-01-25 DIAGNOSIS — I10 Essential (primary) hypertension: Secondary | ICD-10-CM | POA: Diagnosis not present

## 2017-01-25 DIAGNOSIS — I481 Persistent atrial fibrillation: Secondary | ICD-10-CM | POA: Diagnosis not present

## 2017-01-25 DIAGNOSIS — I4819 Other persistent atrial fibrillation: Secondary | ICD-10-CM

## 2017-01-25 MED ORDER — METOPROLOL SUCCINATE ER 25 MG PO TB24: 25.0000 mg | ORAL_TABLET | Freq: Every day | ORAL | 1 refills | Status: DC

## 2017-01-25 NOTE — Patient Instructions (Signed)
Medication Instructions:  Your physician recommends that you continue on your current medications as directed. Please refer to the Current Medication list given to you today.  Labwork: NONE  Testing/Procedures: Your physician has recommended that you have a Cardioversion (DCCV). Electrical Cardioversion uses a jolt of electricity to your heart either through paddles or wired patches attached to your chest. This is a controlled, usually prescheduled, procedure. Defibrillation is done under light anesthesia in the hospital, and you usually go home the day of the procedure. This is done to get your heart back into a normal rhythm. You are not awake for the procedure. Please see the instruction sheet given to you today.  Follow-Up: Your physician recommends that you schedule a follow-up appointment in: [place]  Any Other Special Instructions Will Be Listed Below (If Applicable).  If you need a refill on your cardiac medications before your next appointment, please call your pharmacy.

## 2017-01-28 ENCOUNTER — Other Ambulatory Visit: Payer: Self-pay | Admitting: Cardiology

## 2017-01-28 ENCOUNTER — Telehealth: Payer: Self-pay | Admitting: Cardiology

## 2017-01-28 NOTE — Patient Instructions (Signed)
[name]  01/28/2017     @PREFPERIOPPHARMACY @   Your procedure is scheduled on  02/01/2017   Report to [facility] at  730  A.M.  Call this number if you have problems the morning of surgery:  [phone]   Remember:  Do not eat food or drink liquids after midnight.  Take these medicines the morning of surgery with A SIP OF WATER  Coazal, losartan, metoprolol. DO NOT take your cardiazem the morning of your procedure.   Do not wear jewelry, make-up or nail polish.  Do not wear lotions, powders, or perfumes, or deodorant.  Do not shave 48 hours prior to surgery.  Men may shave face and neck.  Do not bring valuables to the hospital.  [facility] is not responsible for any belongings or valuables.  Contacts, dentures or bridgework may not be worn into surgery.  Leave your suitcase in the car.  After surgery it may be brought to your room.  For patients admitted to the hospital, discharge time will be determined by your treatment team.  Patients discharged the day of surgery will not be allowed to drive home.   Name and phone number of your driver:   Family Special instructions:  DO NOT miss any doses of your eliquis before your procedure.  Please read over the following fact sheets that you were given. Anesthesia Post-op Instructions and Care and Recovery After Surgery       Electrical Cardioversion Electrical cardioversion is the delivery of a jolt of electricity to restore a normal rhythm to the heart. A rhythm that is too fast or is not regular keeps the heart from pumping well. In this procedure, sticky patches or metal paddles are placed on the chest to deliver electricity to the heart from a device. This procedure may be done in an emergency if:  There is low or no blood pressure as a result of the heart rhythm.  Normal rhythm must be restored as fast as possible to protect the brain and heart from further damage.  It may save a life.  This  procedure may also be done for irregular or fast heart rhythms that are not immediately life-threatening. Tell a health care provider about:  Any allergies you have.  All medicines you are taking, including vitamins, herbs, eye drops, creams, and over-the-counter medicines.  Any problems you or family members have had with anesthetic medicines.  Any blood disorders you have.  Any surgeries you have had.  Any medical conditions you have.  Whether you are pregnant or may be pregnant. What are the risks? Generally, this is a safe procedure. However, problems may occur, including:  Allergic reactions to medicines.  A blood clot that breaks free and travels to other parts of your body.  The possible return of an abnormal heart rhythm within hours or days after the procedure.  Your heart stopping (cardiac arrest). This is rare.  What happens before the procedure? Medicines  Your health care provider may have you start taking: ? Blood-thinning medicines (anticoagulants) so your blood does not clot as easily. ? Medicines may be given to help stabilize your heart rate and rhythm.  Ask your health care provider about changing or stopping your regular medicines. This is especially important if you are taking diabetes medicines or blood thinners. General instructions  Plan to have someone take you home from the hospital or clinic.  If you will be going  home right after the procedure, plan to have someone with you for 24 hours.  Follow instructions from your health care provider about eating or drinking restrictions. What happens during the procedure?  To lower your risk of infection: ? Your health care team will wash or sanitize their hands. ? Your skin will be washed with soap.  An IV tube will be inserted into one of your veins.  You will be given a medicine to help you relax (sedative).  Sticky patches (electrodes) or metal paddles may be placed on your chest.  An  electrical shock will be delivered. The procedure may vary among health care providers and hospitals. What happens after the procedure?  Your blood pressure, heart rate, breathing rate, and blood oxygen level will be monitored until the medicines you were given have worn off.  Do not drive for 24 hours if you were given a sedative.  Your heart rhythm will be watched to make sure it does not change. This information is not intended to replace advice given to you by your health care provider. Make sure you discuss any questions you have with your health care provider. Document Released: 12/15/2001 Document Revised: 08/24/2015 Document Reviewed: 07/01/2015 Elsevier Interactive Patient Education  2017 [employer].  Electrical Cardioversion, Care After This sheet gives you information about how to care for yourself after your procedure. Your health care provider may also give you more specific instructions. If you have problems or questions, contact your health care provider. What can I expect after the procedure? After the procedure, it is common to have:  Some redness on the skin where the shocks were given.  Follow these instructions at home:  Do not drive for 24 hours if you were given a medicine to help you relax (sedative).  Take over-the-counter and prescription medicines only as told by your health care provider.  Ask your health care provider how to check your pulse. Check it often.  Rest for 48 hours after the procedure or as told by your health care provider.  Avoid or limit your caffeine use as told by your health care provider. Contact a health care provider if:  You feel like your heart is beating too quickly or your pulse is not regular.  You have a serious muscle cramp that does not go away. Get help right away if:  You have discomfort in your chest.  You are dizzy or you feel faint.  You have trouble breathing or you are short of breath.  Your speech is  slurred.  You have trouble moving an arm or leg on one side of your body.  Your fingers or toes turn cold or blue. This information is not intended to replace advice given to you by your health care provider. Make sure you discuss any questions you have with your health care provider. Document Released: 10/15/2012 Document Revised: 07/29/2015 Document Reviewed: 07/01/2015 Elsevier Interactive Patient Education  2018 [place] Anesthesia is a term that refers to techniques, procedures, and medicines that help a person stay safe and comfortable during a medical procedure. Monitored anesthesia care, or sedation, is one type of anesthesia. Your anesthesia specialist may recommend sedation if you will be having a procedure that does not require you to be unconscious, such as:  Cataract surgery.  A dental procedure.  A biopsy.  A colonoscopy.  During the procedure, you may receive a medicine to help you relax (sedative). There are three levels of sedation:  Mild  sedation. At this level, you may feel awake and relaxed. You will be able to follow directions.  Moderate sedation. At this level, you will be sleepy. You may not remember the procedure.  Deep sedation. At this level, you will be asleep. You will not remember the procedure.  The more medicine you are given, the deeper your level of sedation will be. Depending on how you respond to the procedure, the anesthesia specialist may change your level of sedation or the type of anesthesia to fit your needs. An anesthesia specialist will monitor you closely during the procedure. Let your health care provider know about:  Any allergies you have.  All medicines you are taking, including vitamins, herbs, eye drops, creams, and over-the-counter medicines.  Any use of steroids (by mouth or as a cream).  Any problems you or family members have had with sedatives and anesthetic medicines.  Any blood disorders  you have.  Any surgeries you have had.  Any medical conditions you have, such as sleep apnea.  Whether you are pregnant or may be pregnant.  Any use of cigarettes, alcohol, or street drugs. What are the risks? Generally, this is a safe procedure. However, problems may occur, including:  Getting too much medicine (oversedation).  Nausea.  Allergic reaction to medicines.  Trouble breathing. If this happens, a breathing tube may be used to help with breathing. It will be removed when you are awake and breathing on your own.  Heart trouble.  Lung trouble.  Before the procedure Staying hydrated Follow instructions from your health care provider about hydration, which may include:  Up to 2 hours before the procedure - you may continue to drink clear liquids, such as water, clear fruit juice, black coffee, and plain tea.  Eating and drinking restrictions Follow instructions from your health care provider about eating and drinking, which may include:  8 hours before the procedure - stop eating heavy meals or foods such as meat, fried foods, or fatty foods.  6 hours before the procedure - stop eating light meals or foods, such as toast or cereal.  6 hours before the procedure - stop drinking milk or drinks that contain milk.  2 hours before the procedure - stop drinking clear liquids.  Medicines Ask your health care provider about:  Changing or stopping your regular medicines. This is especially important if you are taking diabetes medicines or blood thinners.  Taking medicines such as aspirin and ibuprofen. These medicines can thin your blood. Do not take these medicines before your procedure if your health care provider instructs you not to.  Tests and exams  You will have a physical exam.  You may have blood tests done to show: ? How well your kidneys and liver are working. ? How well your blood can clot.  General instructions  Plan to have someone take you home  from the hospital or clinic.  If you will be going home right after the procedure, plan to have someone with you for 24 hours.  What happens during the procedure?  Your blood pressure, heart rate, breathing, level of pain and overall condition will be monitored.  An IV tube will be inserted into one of your veins.  Your anesthesia specialist will give you medicines as needed to keep you comfortable during the procedure. This may mean changing the level of sedation.  The procedure will be performed. After the procedure  Your blood pressure, heart rate, breathing rate, and blood oxygen level will be monitored  until the medicines you were given have worn off.  Do not drive for 24 hours if you received a sedative.  You may: ? Feel sleepy, clumsy, or nauseous. ? Feel forgetful about what happened after the procedure. ? Have a sore throat if you had a breathing tube during the procedure. ? Vomit. This information is not intended to replace advice given to you by your health care provider. Make sure you discuss any questions you have with your health care provider. Document Released: 09/20/2004 Document Revised: 06/03/2015 Document Reviewed: 04/17/2015 Elsevier Interactive Patient Education  2018 [place], Care After These instructions provide you with information about caring for yourself after your procedure. Your health care provider may also give you more specific instructions. Your treatment has been planned according to current medical practices, but problems sometimes occur. Call your health care provider if you have any problems or questions after your procedure. What can I expect after the procedure? After your procedure, it is common to:  Feel sleepy for several hours.  Feel clumsy and have poor balance for several hours.  Feel forgetful about what happened after the procedure.  Have poor judgment for several hours.  Feel nauseous or  vomit.  Have a sore throat if you had a breathing tube during the procedure.  Follow these instructions at home: For at least 24 hours after the procedure:   Do not: ? Participate in activities in which you could fall or become injured. ? Drive. ? Use heavy machinery. ? Drink alcohol. ? Take sleeping pills or medicines that cause drowsiness. ? Make important decisions or sign legal documents. ? Take care of children on your own.  Rest. Eating and drinking  Follow the diet that is recommended by your health care provider.  If you vomit, drink water, juice, or soup when you can drink without vomiting.  Make sure you have little or no nausea before eating solid foods. General instructions  Have a responsible adult stay with you until you are awake and alert.  Take over-the-counter and prescription medicines only as told by your health care provider.  If you smoke, do not smoke without supervision.  Keep all follow-up visits as told by your health care provider. This is important. Contact a health care provider if:  You keep feeling nauseous or you keep vomiting.  You feel light-headed.  You develop a rash.  You have a fever. Get help right away if:  You have trouble breathing. This information is not intended to replace advice given to you by your health care provider. Make sure you discuss any questions you have with your health care provider. Document Released: 04/17/2015 Document Revised: 08/17/2015 Document Reviewed: 04/17/2015 Elsevier Interactive Patient Education  [employer].

## 2017-01-28 NOTE — Telephone Encounter (Signed)
Cardioversion scheduled for 02/01/17 at 9:30  Preop 01/30/17 at 2:15

## 2017-01-30 ENCOUNTER — Encounter (HOSPITAL_COMMUNITY): Payer: Self-pay

## 2017-01-30 ENCOUNTER — Encounter (HOSPITAL_COMMUNITY)
Admission: RE | Admit: 2017-01-30 | Discharge: 2017-01-30 | Disposition: A | Discharge status: Home / Self Care | Payer: Medicare Other | Source: Ambulatory Visit | Attending: Cardiology | Admitting: Cardiology

## 2017-01-30 ENCOUNTER — Other Ambulatory Visit: Payer: Self-pay

## 2017-01-30 DIAGNOSIS — F329 Major depressive disorder, single episode, unspecified: Secondary | ICD-10-CM | POA: Diagnosis not present

## 2017-01-30 DIAGNOSIS — I5023 Acute on chronic systolic (congestive) heart failure: Secondary | ICD-10-CM | POA: Diagnosis not present

## 2017-01-30 DIAGNOSIS — R0600 Dyspnea, unspecified: Secondary | ICD-10-CM | POA: Diagnosis not present

## 2017-01-30 DIAGNOSIS — R079 Chest pain, unspecified: Secondary | ICD-10-CM | POA: Diagnosis not present

## 2017-01-30 DIAGNOSIS — H3530 Unspecified macular degeneration: Secondary | ICD-10-CM | POA: Diagnosis not present

## 2017-01-30 DIAGNOSIS — I130 Hypertensive heart and chronic kidney disease with heart failure and stage 1 through stage 4 chronic kidney disease, or unspecified chronic kidney disease: Secondary | ICD-10-CM | POA: Diagnosis not present

## 2017-01-30 DIAGNOSIS — I2510 Atherosclerotic heart disease of native coronary artery without angina pectoris: Secondary | ICD-10-CM | POA: Diagnosis not present

## 2017-01-30 DIAGNOSIS — Z90710 Acquired absence of both cervix and uterus: Secondary | ICD-10-CM | POA: Diagnosis not present

## 2017-01-30 DIAGNOSIS — I340 Nonrheumatic mitral (valve) insufficiency: Secondary | ICD-10-CM | POA: Diagnosis not present

## 2017-01-30 DIAGNOSIS — Z7901 Long term (current) use of anticoagulants: Secondary | ICD-10-CM | POA: Diagnosis not present

## 2017-01-30 DIAGNOSIS — I429 Cardiomyopathy, unspecified: Secondary | ICD-10-CM | POA: Diagnosis not present

## 2017-01-30 DIAGNOSIS — E785 Hyperlipidemia, unspecified: Secondary | ICD-10-CM | POA: Diagnosis not present

## 2017-01-30 DIAGNOSIS — R0602 Shortness of breath: Secondary | ICD-10-CM | POA: Diagnosis not present

## 2017-01-30 DIAGNOSIS — R0789 Other chest pain: Secondary | ICD-10-CM | POA: Diagnosis not present

## 2017-01-30 DIAGNOSIS — I481 Persistent atrial fibrillation: Secondary | ICD-10-CM | POA: Diagnosis not present

## 2017-01-30 DIAGNOSIS — N182 Chronic kidney disease, stage 2 (mild): Secondary | ICD-10-CM | POA: Diagnosis not present

## 2017-01-30 DIAGNOSIS — I509 Heart failure, unspecified: Secondary | ICD-10-CM | POA: Diagnosis not present

## 2017-01-30 DIAGNOSIS — I10 Essential (primary) hypertension: Secondary | ICD-10-CM | POA: Diagnosis not present

## 2017-01-30 DIAGNOSIS — I4891 Unspecified atrial fibrillation: Secondary | ICD-10-CM | POA: Diagnosis not present

## 2017-01-30 DIAGNOSIS — I110 Hypertensive heart disease with heart failure: Secondary | ICD-10-CM | POA: Diagnosis not present

## 2017-01-30 DIAGNOSIS — I4892 Unspecified atrial flutter: Secondary | ICD-10-CM | POA: Diagnosis not present

## 2017-01-30 DIAGNOSIS — Z79899 Other long term (current) drug therapy: Secondary | ICD-10-CM | POA: Diagnosis not present

## 2017-01-30 DIAGNOSIS — Z7982 Long term (current) use of aspirin: Secondary | ICD-10-CM | POA: Diagnosis not present

## 2017-01-30 DIAGNOSIS — Z96653 Presence of artificial knee joint, bilateral: Secondary | ICD-10-CM | POA: Diagnosis not present

## 2017-01-30 LAB — CBC WITH DIFFERENTIAL/PLATELET
BASOS ABS: 0 10*3/uL (ref 0.0–0.1)
Basophils Relative: 0 %
Eosinophils Absolute: 0.2 10*3/uL (ref 0.0–0.7)
Eosinophils Relative: 3 %
HEMATOCRIT: 36.8 % (ref 36.0–46.0)
HEMOGLOBIN: 11.6 g/dL — AB (ref 12.0–15.0)
LYMPHS PCT: 27 %
Lymphs Abs: 2.1 10*3/uL (ref 0.7–4.0)
MCH: 26.7 pg (ref 26.0–34.0)
MCHC: 31.5 g/dL (ref 30.0–36.0)
MCV: 84.6 fL (ref 78.0–100.0)
MONO ABS: 0.8 10*3/uL (ref 0.1–1.0)
MONOS PCT: 10 %
NEUTROS ABS: 4.8 10*3/uL (ref 1.7–7.7)
NEUTROS PCT: 60 %
Platelets: 365 10*3/uL (ref 150–400)
RBC: 4.35 MIL/uL (ref 3.87–5.11)
RDW: 14.9 % (ref 11.5–15.5)
WBC: 8.1 10*3/uL (ref 4.0–10.5)

## 2017-01-30 LAB — BASIC METABOLIC PANEL
ANION GAP: 11 (ref 5–15)
BUN: 18 mg/dL (ref 6–20)
CHLORIDE: 102 mmol/L (ref 101–111)
CO2: 24 mmol/L (ref 22–32)
Calcium: 9.2 mg/dL (ref 8.9–10.3)
Creatinine, Ser: 0.8 mg/dL (ref 0.44–1.00)
GFR calc non Af Amer: >60 mL/min (ref >60)
GLUCOSE: 100 mg/dL — AB (ref 65–99)
POTASSIUM: 3.9 mmol/L (ref 3.5–5.1)
Sodium: 137 mmol/L (ref 135–145)

## 2017-02-01 ENCOUNTER — Ambulatory Visit (HOSPITAL_COMMUNITY): Payer: Medicare Other | Admitting: Anesthesiology

## 2017-02-01 ENCOUNTER — Other Ambulatory Visit: Payer: Self-pay

## 2017-02-01 ENCOUNTER — Encounter (HOSPITAL_COMMUNITY)
Admission: RE | Disposition: A | Discharge status: Home Health | Payer: Self-pay | Source: Ambulatory Visit | Attending: Family Medicine

## 2017-02-01 ENCOUNTER — Inpatient Hospital Stay (HOSPITAL_COMMUNITY)
Admission: RE | Admit: 2017-02-01 | Discharge: 2017-02-05 | DRG: 308 | Disposition: A | Discharge status: Home Health | Payer: Medicare Other | Source: Ambulatory Visit | Attending: Internal Medicine | Admitting: Internal Medicine

## 2017-02-01 ENCOUNTER — Encounter (HOSPITAL_COMMUNITY): Payer: Self-pay | Admitting: *Deleted

## 2017-02-01 ENCOUNTER — Ambulatory Visit (HOSPITAL_COMMUNITY): Payer: Medicare Other

## 2017-02-01 DIAGNOSIS — R0600 Dyspnea, unspecified: Secondary | ICD-10-CM | POA: Diagnosis not present

## 2017-02-01 DIAGNOSIS — I509 Heart failure, unspecified: Secondary | ICD-10-CM | POA: Diagnosis not present

## 2017-02-01 DIAGNOSIS — I5023 Acute on chronic systolic (congestive) heart failure: Secondary | ICD-10-CM

## 2017-02-01 DIAGNOSIS — Z7982 Long term (current) use of aspirin: Secondary | ICD-10-CM | POA: Diagnosis not present

## 2017-02-01 DIAGNOSIS — I130 Hypertensive heart and chronic kidney disease with heart failure and stage 1 through stage 4 chronic kidney disease, or unspecified chronic kidney disease: Secondary | ICD-10-CM | POA: Diagnosis present

## 2017-02-01 DIAGNOSIS — Z7901 Long term (current) use of anticoagulants: Secondary | ICD-10-CM | POA: Diagnosis not present

## 2017-02-01 DIAGNOSIS — I4891 Unspecified atrial fibrillation: Secondary | ICD-10-CM | POA: Diagnosis present

## 2017-02-01 DIAGNOSIS — Z79899 Other long term (current) drug therapy: Secondary | ICD-10-CM

## 2017-02-01 DIAGNOSIS — I429 Cardiomyopathy, unspecified: Secondary | ICD-10-CM | POA: Diagnosis not present

## 2017-02-01 DIAGNOSIS — Z96653 Presence of artificial knee joint, bilateral: Secondary | ICD-10-CM | POA: Diagnosis present

## 2017-02-01 DIAGNOSIS — I481 Persistent atrial fibrillation: Secondary | ICD-10-CM | POA: Diagnosis not present

## 2017-02-01 DIAGNOSIS — I110 Hypertensive heart disease with heart failure: Secondary | ICD-10-CM | POA: Diagnosis not present

## 2017-02-01 DIAGNOSIS — I340 Nonrheumatic mitral (valve) insufficiency: Secondary | ICD-10-CM | POA: Diagnosis not present

## 2017-02-01 DIAGNOSIS — N182 Chronic kidney disease, stage 2 (mild): Secondary | ICD-10-CM | POA: Diagnosis present

## 2017-02-01 DIAGNOSIS — Z90710 Acquired absence of both cervix and uterus: Secondary | ICD-10-CM | POA: Diagnosis not present

## 2017-02-01 DIAGNOSIS — I10 Essential (primary) hypertension: Secondary | ICD-10-CM | POA: Diagnosis not present

## 2017-02-01 DIAGNOSIS — F329 Major depressive disorder, single episode, unspecified: Secondary | ICD-10-CM | POA: Diagnosis present

## 2017-02-01 DIAGNOSIS — I4892 Unspecified atrial flutter: Secondary | ICD-10-CM | POA: Diagnosis present

## 2017-02-01 DIAGNOSIS — I4819 Other persistent atrial fibrillation: Secondary | ICD-10-CM

## 2017-02-01 DIAGNOSIS — R0602 Shortness of breath: Secondary | ICD-10-CM | POA: Diagnosis not present

## 2017-02-01 DIAGNOSIS — E785 Hyperlipidemia, unspecified: Secondary | ICD-10-CM | POA: Diagnosis present

## 2017-02-01 DIAGNOSIS — R079 Chest pain, unspecified: Secondary | ICD-10-CM | POA: Diagnosis not present

## 2017-02-01 DIAGNOSIS — I2510 Atherosclerotic heart disease of native coronary artery without angina pectoris: Secondary | ICD-10-CM | POA: Diagnosis present

## 2017-02-01 DIAGNOSIS — H3530 Unspecified macular degeneration: Secondary | ICD-10-CM | POA: Diagnosis present

## 2017-02-01 DIAGNOSIS — R0789 Other chest pain: Secondary | ICD-10-CM | POA: Diagnosis not present

## 2017-02-01 LAB — COMPREHENSIVE METABOLIC PANEL
ALBUMIN: 3.5 g/dL (ref 3.5–5.0)
ALK PHOS: 51 U/L (ref 38–126)
ALT: 12 U/L — ABNORMAL LOW (ref 14–54)
AST: 18 U/L (ref 15–41)
Anion gap: 11 (ref 5–15)
BILIRUBIN TOTAL: 1.2 mg/dL (ref 0.3–1.2)
BUN: 18 mg/dL (ref 6–20)
CO2: 24 mmol/L (ref 22–32)
Calcium: 9.1 mg/dL (ref 8.9–10.3)
Chloride: 104 mmol/L (ref 101–111)
Creatinine, Ser: 0.65 mg/dL (ref 0.44–1.00)
GFR calc Af Amer: >60 mL/min (ref >60)
GFR calc non Af Amer: >60 mL/min (ref >60)
GLUCOSE: 99 mg/dL (ref 65–99)
POTASSIUM: 3.9 mmol/L (ref 3.5–5.1)
Sodium: 139 mmol/L (ref 135–145)
TOTAL PROTEIN: 6.5 g/dL (ref 6.5–8.1)

## 2017-02-01 LAB — CBC WITH DIFFERENTIAL/PLATELET
Basophils Absolute: 0 10*3/uL (ref 0.0–0.1)
Basophils Relative: 0 %
Eosinophils Absolute: 0.2 10*3/uL (ref 0.0–0.7)
Eosinophils Relative: 4 %
HCT: 35.1 % — ABNORMAL LOW (ref 36.0–46.0)
HEMOGLOBIN: 11 g/dL — AB (ref 12.0–15.0)
LYMPHS ABS: 1.9 10*3/uL (ref 0.7–4.0)
LYMPHS PCT: 29 %
MCH: 26.7 pg (ref 26.0–34.0)
MCHC: 31.3 g/dL (ref 30.0–36.0)
MCV: 85.2 fL (ref 78.0–100.0)
Monocytes Absolute: 0.6 10*3/uL (ref 0.1–1.0)
Monocytes Relative: 9 %
NEUTROS PCT: 58 %
Neutro Abs: 3.8 10*3/uL (ref 1.7–7.7)
Platelets: 328 10*3/uL (ref 150–400)
RBC: 4.12 MIL/uL (ref 3.87–5.11)
RDW: 15 % (ref 11.5–15.5)
WBC: 6.5 10*3/uL (ref 4.0–10.5)

## 2017-02-01 LAB — BRAIN NATRIURETIC PEPTIDE: B Natriuretic Peptide: 253 pg/mL — ABNORMAL HIGH (ref 0.0–100.0)

## 2017-02-01 LAB — TSH: TSH: 1.783 u[IU]/mL (ref 0.350–4.500)

## 2017-02-01 LAB — LIPASE, BLOOD: Lipase: 30 U/L (ref 11–51)

## 2017-02-01 LAB — TROPONIN I

## 2017-02-01 SURGERY — CARDIOVERSION
Anesthesia: Monitor Anesthesia Care

## 2017-02-01 MED ORDER — FUROSEMIDE 40 MG PO TABS
40.0000 mg | ORAL_TABLET | Freq: Every day | ORAL | Status: DC
  Administered 2017-02-02 – 2017-02-03 (×2): 40 mg via ORAL
  Filled 2017-02-01 (×5): qty 1

## 2017-02-01 MED ORDER — NITROGLYCERIN 0.4 MG SL SUBL: 0.4000 mg | SUBLINGUAL_TABLET | SUBLINGUAL | Status: DC | PRN

## 2017-02-01 MED ORDER — ASPIRIN EC 81 MG PO TBEC
81.0000 mg | DELAYED_RELEASE_TABLET | Freq: Every day | ORAL | Status: DC
  Administered 2017-02-01 – 2017-02-05 (×5): 81 mg via ORAL
  Filled 2017-02-01 (×5): qty 1

## 2017-02-01 MED ORDER — OCUVITE-LUTEIN PO CAPS
1.0000 | ORAL_CAPSULE | Freq: Every day | ORAL | Status: DC
  Administered 2017-02-02 – 2017-02-04 (×3): 1 via ORAL
  Filled 2017-02-01 (×3): qty 1

## 2017-02-01 MED ORDER — DILTIAZEM HCL ER COATED BEADS 120 MG PO CP24: 120.0000 mg | ORAL_CAPSULE | Freq: Every day | ORAL | Status: DC

## 2017-02-01 MED ORDER — ALBUTEROL SULFATE (2.5 MG/3ML) 0.083% IN NEBU: 2.5000 mg | INHALATION_SOLUTION | RESPIRATORY_TRACT | Status: DC | PRN

## 2017-02-01 MED ORDER — LACTATED RINGERS IV SOLN
INTRAVENOUS | Status: DC
  Administered 2017-02-01: 09:00:00 via INTRAVENOUS

## 2017-02-01 MED ORDER — KETOTIFEN FUMARATE 0.025 % OP SOLN
2.0000 [drp] | Freq: Three times a day (TID) | OPHTHALMIC | Status: DC | PRN
  Filled 2017-02-01: qty 5

## 2017-02-01 MED ORDER — TRAZODONE HCL 50 MG PO TABS
50.0000 mg | ORAL_TABLET | Freq: Every evening | ORAL | Status: DC | PRN
  Administered 2017-02-01: 50 mg via ORAL
  Filled 2017-02-01: qty 1

## 2017-02-01 MED ORDER — ACETAMINOPHEN 325 MG PO TABS
650.0000 mg | ORAL_TABLET | Freq: Four times a day (QID) | ORAL | Status: DC | PRN
  Administered 2017-02-02 – 2017-02-03 (×2): 650 mg via ORAL
  Filled 2017-02-01 (×2): qty 2

## 2017-02-01 MED ORDER — POLYETHYLENE GLYCOL 3350 17 G PO PACK: 17.0000 g | PACK | Freq: Every day | ORAL | Status: DC | PRN

## 2017-02-01 MED ORDER — ACETAMINOPHEN 500 MG PO TABS
1000.0000 mg | ORAL_TABLET | Freq: Once | ORAL | Status: AC
  Administered 2017-02-01: 1000 mg via ORAL
  Filled 2017-02-01: qty 2

## 2017-02-01 MED ORDER — ACETAMINOPHEN ER 650 MG PO TBCR: 1300.0000 mg | EXTENDED_RELEASE_TABLET | Freq: Three times a day (TID) | ORAL | Status: DC

## 2017-02-01 MED ORDER — ONDANSETRON HCL 4 MG PO TABS
4.0000 mg | ORAL_TABLET | Freq: Four times a day (QID) | ORAL | Status: DC | PRN
Start: 2017-02-01 — End: 2017-02-05

## 2017-02-01 MED ORDER — ATORVASTATIN CALCIUM 20 MG PO TABS
20.0000 mg | ORAL_TABLET | Freq: Every day | ORAL | Status: DC
  Administered 2017-02-01 – 2017-02-04 (×4): 20 mg via ORAL
  Filled 2017-02-01 (×4): qty 1

## 2017-02-01 MED ORDER — ONDANSETRON HCL 4 MG/2ML IJ SOLN: 4.0000 mg | Freq: Four times a day (QID) | INTRAMUSCULAR | Status: DC | PRN

## 2017-02-01 MED ORDER — SODIUM CHLORIDE 0.9% FLUSH
3.0000 mL | INTRAVENOUS | Status: DC | PRN
  Administered 2017-02-05: 3 mL via INTRAVENOUS
  Filled 2017-02-01: qty 3

## 2017-02-01 MED ORDER — ACETAMINOPHEN 325 MG PO TABS
650.0000 mg | ORAL_TABLET | Freq: Four times a day (QID) | ORAL | Status: DC
  Administered 2017-02-01 – 2017-02-02 (×2): 650 mg via ORAL
  Filled 2017-02-01 (×3): qty 2

## 2017-02-01 MED ORDER — RIVAROXABAN 15 MG PO TABS
15.0000 mg | ORAL_TABLET | Freq: Every day | ORAL | Status: DC
  Administered 2017-02-01: 15 mg via ORAL
  Filled 2017-02-01: qty 1

## 2017-02-01 MED ORDER — METOPROLOL SUCCINATE ER 25 MG PO TB24: 25.0000 mg | ORAL_TABLET | Freq: Every day | ORAL | Status: DC

## 2017-02-01 MED ORDER — SODIUM CHLORIDE 0.9 % IV SOLN: 250.0000 mL | INTRAVENOUS | Status: DC | PRN

## 2017-02-01 MED ORDER — LIDOCAINE HCL (PF) 1 % IJ SOLN
INTRAMUSCULAR | Status: AC
  Filled 2017-02-01: qty 5

## 2017-02-01 MED ORDER — POTASSIUM CHLORIDE CRYS ER 20 MEQ PO TBCR
20.0000 meq | EXTENDED_RELEASE_TABLET | Freq: Every day | ORAL | Status: DC
  Administered 2017-02-01 – 2017-02-05 (×5): 20 meq via ORAL
  Filled 2017-02-01 (×5): qty 1

## 2017-02-01 MED ORDER — FUROSEMIDE 10 MG/ML IJ SOLN
40.0000 mg | Freq: Once | INTRAMUSCULAR | Status: AC
  Administered 2017-02-01: 40 mg via INTRAVENOUS
  Filled 2017-02-01: qty 4

## 2017-02-01 MED ORDER — MIDAZOLAM HCL 2 MG/2ML IJ SOLN
INTRAMUSCULAR | Status: AC
  Filled 2017-02-01: qty 2

## 2017-02-01 MED ORDER — BALSALAZIDE DISODIUM 750 MG PO CAPS
2250.0000 mg | ORAL_CAPSULE | Freq: Three times a day (TID) | ORAL | Status: DC
  Filled 2017-02-01 (×8): qty 3

## 2017-02-01 MED ORDER — METOPROLOL SUCCINATE ER 50 MG PO TB24
50.0000 mg | ORAL_TABLET | Freq: Every day | ORAL | Status: DC
  Administered 2017-02-01 – 2017-02-05 (×4): 50 mg via ORAL
  Filled 2017-02-01 (×5): qty 1

## 2017-02-01 MED ORDER — ACETAMINOPHEN 650 MG RE SUPP: 650.0000 mg | Freq: Four times a day (QID) | RECTAL | Status: DC | PRN

## 2017-02-01 MED ORDER — PROPOFOL 10 MG/ML IV BOLUS
INTRAVENOUS | Status: AC
  Filled 2017-02-01: qty 20

## 2017-02-01 MED ORDER — SODIUM CHLORIDE 0.9% FLUSH
3.0000 mL | Freq: Two times a day (BID) | INTRAVENOUS | Status: DC
  Administered 2017-02-01 – 2017-02-05 (×7): 3 mL via INTRAVENOUS

## 2017-02-01 MED ORDER — SENNA 8.6 MG PO TABS
1.0000 | ORAL_TABLET | Freq: Two times a day (BID) | ORAL | Status: DC
  Administered 2017-02-01 – 2017-02-05 (×7): 8.6 mg via ORAL
  Filled 2017-02-01 (×8): qty 1

## 2017-02-01 MED ORDER — LOSARTAN POTASSIUM 50 MG PO TABS
25.0000 mg | ORAL_TABLET | Freq: Every day | ORAL | Status: DC
  Administered 2017-02-02 – 2017-02-05 (×4): 25 mg via ORAL
  Filled 2017-02-01 (×5): qty 1

## 2017-02-01 NOTE — ED Triage Notes (Signed)
Pt comes in today for cardioversion, pt worked up in day surg and seen by Dr Harl Bowie, pt missed her dose of bllod thinner, MD would not do the procedure. Pt complaining of SOB, seen to ED

## 2017-02-01 NOTE — Progress Notes (Signed)
Transported to room from ed at 1830.  Alert and oriented and denied chest pain.  Edema to lower extremities .  Purwick placed.  Stated she came in for cardioversion this morning and was since she had missed and xarelto dose and was not clinically stable they did not do the cardioversion and will possibly have it done Monday.

## 2017-02-01 NOTE — ED Notes (Signed)
Patient transported to X-ray 

## 2017-02-01 NOTE — Anesthesia Preprocedure Evaluation (Addendum)
Anesthesia Evaluation  Patient identified by MRN, date of birth, ID band Patient awake    Reviewed: Allergy & Precautions, NPO status , Patient's Chart, lab work & pertinent test results, reviewed documented beta blocker date and time   Airway Mallampati: I  TM Distance: >3 FB Neck ROM: Full    Dental  (+) Dental Advisory Given, Edentulous Upper, Edentulous Lower   Pulmonary neg pulmonary ROS,    Pulmonary exam normal breath sounds clear to auscultation       Cardiovascular hypertension, Pt. on home beta blockers and Pt. on medications + CAD, + Cardiac Stents (BMS to RCA) and +CHF  (-) Past MI + dysrhythmias Atrial Fibrillation  Rhythm:Irregular Rate:Normal     Neuro/Psych PSYCHIATRIC DISORDERS Depression negative neurological ROS     GI/Hepatic Neg liver ROS, PUD,   Endo/Other  negative endocrine ROS  Renal/GU negative Renal ROS     Musculoskeletal  (+) Arthritis , Osteoarthritis,    Abdominal   Peds  Hematology   Anesthesia Other Findings   Reproductive/Obstetrics                             Anesthesia Physical Anesthesia Plan  ASA: III  Anesthesia Plan: MAC   Post-op Pain Management:    Induction:   PONV Risk Score and Plan:   Airway Management Planned: Simple Face Mask  Additional Equipment:   Intra-op Plan:   Post-operative Plan:   Informed Consent:   Plan Discussed with: Anesthesiologist  Anesthesia Plan Comments:         Anesthesia Quick Evaluation

## 2017-02-01 NOTE — OR Nursing (Signed)
Patient in pre-procedure for cardioversion.  Dr. Harl Bowie in with patient to discuss procedure.  After consultation with patient, Dr. Harl Bowie cancelled cardioversion for today - due to non-consecutive dose of Xarelto -patient missed a dose on 01/22/2017.  Dr. Harl Bowie orders patient discharged to home.   After consultation, patient complaining of tightness in chest, and is uneasy about going home.  Contacted Dr. Harl Bowie - orders patient sent to ER.  Called ER to request bed and spoke to [employer].  Transferred patient by stretcher with monitor and oxygen to ER.  Patient accompanied by Selena Lesser RN, Kyung Bacca RN and patients husband.

## 2017-02-01 NOTE — H&P (Signed)
Patient Demographics:    [name], is a 84 y.o. female  MRN: [id number]   DOB - 1930/02/09  Admit Date - 02/01/2017  Outpatient Primary MD for the patient is Rory Percy, MD   Assessment & Plan:    Active Problems:   Chest pain   1)Afib/Flutter-for TEE  And cardioversion (DCCV) on Monday, 02/04/2017, patient will be n.p.o. from Sunday night . Patient initially presented on 02/01/17  for outpatient cardioversion, however It was discovered that she had missed a dose of xarelto Jan 22 2017, so  cardiologist Dr Branch recommended  TEE  And cardioversion (DCCV) on Monday, 02/04/2017, as patient has not had 3 weeks of uninterrupted anticoagulation, cardiologist also advised weaning off Cardizem and increasing Toprol-XL to 50 mg daily  2)CAD-now with chest pains, prior RCA bare mental stent in 2014, cycle troponins rule out ACS, continue aspirin, Lipitor and Toprol  3)HFrEF-patient with history of chronic systolic dysfunction CHF, ????  Mild exacerbation at this time as patient has sob and dyspnea on exertion, LVEF 30-35% from echo 12/2016. This was a new drop from her prior 2015 echo where LVEF was 45-50%. ????  Systolic dysfunction due to tachycardia in the setting of A. fib/A flutter, continue losartan 25 mg daily Toprol-XL as ordered, diuresis with Lasix as ordered  4)Social/Ethics-advanced directives discussed patient is a full code   With History of - Reviewed by me  Past Medical History:  Diagnosis Date  . Arthritis   . Atrial flutter (HCC)   . Coronary atherosclerosis of native coronary artery    BMS RCA May 2014 - [place] stent  . Depression   . Essential hypertension, benign   . Hyperlipemia   . Macular degeneration   . Paroxysmal atrial fibrillation (HCC)   . Secondary cardiomyopathy (HCC)    LVEF 35% - improved to 45-50%  . Ulcerative colitis       Past Surgical History:  Procedure Laterality Date  . ABDOMINAL HYSTERECTOMY    . APPENDECTOMY    . Bilateral knee replacements      20 07, 2008  . BLADDER SURGERY    . COLONOSCOPY N/A 06/15/2015   Procedure: COLONOSCOPY;  Surgeon: Rogene Houston, MD;  [facility];  Service: Endoscopy;  Laterality: N/A;  210  . CYSTOSCOPY W/ RETROGRADES Bilateral 05/14/2016   Procedure: CYSTOSCOPY WITH BILATERAL RETROGRADE PYELOGRAM;  Surgeon: Raynelle Bring, MD;  Location: [facility];  Service: Urology;  Laterality: Bilateral;  GENERAL ANESTHESIA WITH PARALYSIS  . LEFT HEART CATHETERIZATION WITH CORONARY ANGIOGRAM N/A 10/30/2013   Procedure: LEFT HEART CATHETERIZATION WITH CORONARY ANGIOGRAM;  Surgeon: Burnell Blanks, MD;  Location: [facility] CATH LAB;  Service: Cardiovascular;  Laterality: N/A;  . TONSILLECTOMY    . TOTAL KNEE ARTHROPLASTY    . TRANSURETHRAL RESECTION OF BLADDER TUMOR N/A 05/14/2016   Procedure: TRANSURETHRAL RESECTION OF BLADDER TUMOR (TURBT);  Surgeon: Raynelle Bring, MD;  Location: [facility];  Service: Urology;  Laterality: N/A;  GENERAL ANESTHESIA WITH PARALYSIS  . YAG LASER APPLICATION Bilateral 56/03/8754   Procedure: YAG LASER APPLICATION;  Surgeon: Williams Che, MD;  Location: [facility];  Service: Ophthalmology;  Laterality: Bilateral;    Chief Complaint  Patient presents with  . Atrial Fibrillation      HPI:    [name]  is a 84 y.o. female with past medical history relevant for  history of systolic dysfunction CHF (HFrEF), HTN,  CAD (RCA bare mental stent in 2014) hypertension and atrial flutter/Afib on Xarelto who was sent over by Dr. Harl Bowie for further evaluation of CP. Patient  feels more fatigued and shortness of breath, c/o chest pain and chest pressure.   No leg pain, pleuritic symptoms, no palpitations no dizziness, no productive cough   history of A. fib/A flutter, CAD and systolic dysfunction CHF  who presented on 02/01/17  for outpatient cardioversion, however It was discovered that she had missed a dose of xarelto Jan 22 2017, so  cardiologist Dr Harl Bowie recommended  TEE  And cardioversion (DCCV) on Monday, 02/04/2017, as patient has not had 3 weeks of uninterrupted anticoagulation.  Prior to being discharged home from the outpatient center patient developed chest pain she was admitted on 02/02/2016 for further management of chest pains and CHF     Review of systems:    In addition to the HPI above,   A full 12 point Review of 10 Systems was done, except as stated above, all other Review of 10 Systems were negative.    Social History:  Reviewed by me    Social History   Tobacco Use  . Smoking status: Never Smoker  . Smokeless tobacco: Never Used  Substance Use Topics  . Alcohol use: No    Alcohol/week: 0.0 oz       Family History :  Reviewed by me   HTN   Home Medications:   Prior to Admission medications   Medication Sig Start Date End Date Taking? Authorizing Provider  acetaminophen (TYLENOL ARTHRITIS PAIN) 650 MG CR tablet Take 1,300 mg by mouth every 8 (eight) hours.    Yes [provider]  ASPIRIN ADULT LOW STRENGTH 81 MG EC tablet Take 81 mg by mouth daily. 01/25/17  Yes [provider]  atorvastatin (LIPITOR) 20 MG tablet TAKE ONE TABLET BY MOUTH AT BEDTIME 10/08/16  Yes Satira Sark, MD  balsalazide (COLAZAL) 750 MG capsule TAKE THREE CAPSULES BY MOUTH THREE TIMES DAILY (REPLACING DELZICOL) Patient taking differently: TAKE THREE CAPSULES (2250 MG) BY MOUTH THREE TIMES DAILY (REPLACING DELZICOL) 12/09/16  Yes Rehman, Mechele Dawley, MD  diltiazem (CARDIZEM CD) 120 MG 24 hr capsule Take 1 capsule (120 mg total) by mouth daily. 01/23/17  Yes Debbe Odea, MD  furosemide (LASIX) 40 MG tablet Take 1 tablet (40 mg total) by mouth daily. 01/07/17  Yes Satira Sark, MD  ketotifen [facility] ALLERGY) 0.025 % ophthalmic solution Place 2 drops into  both eyes 3 (three) times daily as needed (for dry eyes).    Yes [provider]  losartan (COZAAR) 25 MG tablet TAKE 1 TABLET BY MOUTH EVERY DAY 02/20/16  Yes Satira Sark, MD  metoprolol succinate (TOPROL-XL) 25 MG 24 hr tablet Take 1 tablet (25 mg total) by mouth daily. 01/25/17  Yes Satira Sark, MD  Multiple Vitamins-Minerals (ICAPS AREDS 2 PO) Take 2 tablets by mouth daily at 6 PM.    Yes [provider]  potassium chloride SA (K-DUR,KLOR-CON) 20 MEQ tablet Take  1 tablet (20 mEq total) by mouth daily. 01/07/17  Yes Satira Sark, MD  XARELTO 15 MG TABS tablet TAKE ONE TABLET BY MOUTH DAILY 10/08/16  Yes Satira Sark, MD  nitroGLYCERIN (NITROSTAT) 0.4 MG SL tablet Place 1 tablet (0.4 mg total) under the tongue every 5 (five) minutes as needed for chest pain. 02/28/16 01/28/18  Satira Sark, MD     Allergies:     Allergies  Allergen Reactions  . Sinus & Allergy [Chlorpheniramine-Phenylephrine] Shortness Of Breath  . Demerol Rash  . Penicillins Rash and Other (See Comments)    REACTION: rash, years ago Has patient had a PCN reaction causing immediate rash, facial/tongue/throat swelling, SOB or lightheadedness with hypotension: Yes Has patient had a PCN reaction causing severe rash involving mucus membranes or skin necrosis: No Has patient had a PCN reaction that required hospitalization No Has patient had a PCN reaction occurring within the last 10 years: No If all of the above answers are "NO", then may proceed with Cephalosporin use.      Physical Exam:   Vitals  Blood pressure (!) 143/89, pulse 78, temperature 98.2 F (36.8 C), temperature source Oral, resp. rate 20, height 5' 3"  (1.6 m), weight 102.6 kg (226 lb 3.1 oz), SpO2 95 %.  Physical Examination: General appearance - alert, well appearing, and in no distress and  Mental status - alert, oriented to person, place, and time,  Eyes - sclera anicteric Neck - supple, no JVD  elevation , Chest -diminished in bases bilaterally, faint bibasilar rales  Heart - S1 and S2 normal, irreregular Abdomen - soft, nontender, nondistended, no masses or organomegaly Neurological - screening mental status exam normal, neck supple without rigidity, cranial nerves II through XII intact, DTR's normal and symmetric Extremities - no pedal edema noted, intact peripheral pulses  Skin - warm, dry Psych-affect is appropriate   Data Review:    CBC Recent Labs  Lab 01/30/17 1416 02/01/17 1212  WBC 8.1 6.5  HGB 11.6* 11.0*  HCT 36.8 35.1*  PLT 365 328  MCV 84.6 85.2  MCH 26.7 26.7  MCHC 31.5 31.3  RDW 14.9 15.0  LYMPHSABS 2.1 1.9  MONOABS 0.8 0.6  EOSABS 0.2 0.2  BASOSABS 0.0 0.0   ------------------------------------------------------------------------------------------------------------------  Chemistries  Recent Labs  Lab 01/30/17 1416 02/01/17 1212  NA 137 139  K 3.9 3.9  CL 102 104  CO2 24 24  GLUCOSE 100* 99  BUN 18 18  CREATININE 0.80 0.65  CALCIUM 9.2 9.1  AST  --  18  ALT  --  12*  ALKPHOS  --  51  BILITOT  --  1.2   ------------------------------------------------------------------------------------------------------------------ estimated creatinine clearance is 59.9 mL/min (by C-G formula based on SCr of 0.65 mg/dL). ------------------------------------------------------------------------------------------------------------------ Recent Labs    02/01/17 1212  TSH 1.783     Coagulation profile No results for input(s): INR, PROTIME in the last 168 hours. ------------------------------------------------------------------------------------------------------------------- No results for input(s): DDIMER in the last 72 hours. -------------------------------------------------------------------------------------------------------------------  Cardiac Enzymes Recent Labs  Lab 02/01/17 1212  TROPONINI <0.03    ------------------------------------------------------------------------------------------------------------------    Component Value Date/Time   BNP 253.0 (H) 02/01/2017 1212     ---------------------------------------------------------------------------------------------------------------  Urinalysis    Component Value Date/Time   COLORURINE STRAW (A) 01/02/2017 1420   APPEARANCEUR CLEAR 01/02/2017 1420   LABSPEC 1.008 01/02/2017 1420   PHURINE 7.0 01/02/2017 1420   GLUCOSEU NEGATIVE 01/02/2017 1420   HGBUR NEGATIVE 01/02/2017 [place] 01/02/2017 1420  KETONESUR NEGATIVE 01/02/2017 1420   PROTEINUR NEGATIVE 01/02/2017 1420   NITRITE NEGATIVE 01/02/2017 1420   LEUKOCYTESUR NEGATIVE 01/02/2017 1420    ----------------------------------------------------------------------------------------------------------------   Imaging Results:    Dg Chest 2 View  Result Date: 02/01/2017 CLINICAL DATA:  Chest pain and shortness of breath EXAM: CHEST  2 VIEW COMPARISON:  January 21, 2017 FINDINGS: There is interstitial pulmonary edema. There is no airspace consolidation. There is pulmonary venous hypertension with cardiomegaly. There is no evident adenopathy. There is degenerative change in each shoulder. There is superior migration of the right humeral head. IMPRESSION: Interstitial edema with cardiomegaly and pulmonary venous hypertension. There is likely a degree of congestive heart failure. No consolidation. No adenopathy. Degenerative change in each shoulder with probable chronic rotator cuff tear on the right. Electronically Signed   By: Lowella Grip III M.D.   On: 02/01/2017 13:40    Radiological Exams on Admission: Dg Chest 2 View  Result Date: 02/01/2017 CLINICAL DATA:  Chest pain and shortness of breath EXAM: CHEST  2 VIEW COMPARISON:  January 21, 2017 FINDINGS: There is interstitial pulmonary edema. There is no airspace consolidation. There is pulmonary  venous hypertension with cardiomegaly. There is no evident adenopathy. There is degenerative change in each shoulder. There is superior migration of the right humeral head. IMPRESSION: Interstitial edema with cardiomegaly and pulmonary venous hypertension. There is likely a degree of congestive heart failure. No consolidation. No adenopathy. Degenerative change in each shoulder with probable chronic rotator cuff tear on the right. Electronically Signed   By: Lowella Grip III M.D.   On: 02/01/2017 13:40    DVT Prophylaxis -Xarelto AM Labs Ordered, also please review Full Orders  Family Communication: Admission, patients condition and plan of care including tests being ordered have been discussed with the patient  who indicate understanding and agree with the plan   Code Status - Full Code  Likely DC to  home  Condition   stable  Roxan Hockey M.D on 02/01/2017 at 7:36 PM   Between 7am to 7pm - Pager - [phone] After 7pm go to [email or web address] - password [email or web address]  [facility] - Office  [phone]  Voice Recognition Viviann Spare dictation system was used to create this note, attempts have been made to correct errors. Please contact the author with questions and/or clarifications.

## 2017-02-01 NOTE — H&P (Signed)
Procedure H&P  Patient presents for outpatient elective direct current cardioversion for persistent afib. Please see below for full medical history. She has been on xarelto and has not missed any doses within the last 3 weeks. We will plan for procedure this morning.    Carlyle Dolly MD             [] Hide copied text  [] Hover for details      Cardiology Office Note  Date: 01/25/2017   ID: [name], [name] November 21, 1930, MRN [id number]  PCP: Rory Percy, MD        Primary Cardiologist: Rozann Lesches, MD      Chief Complaint  Patient presents with  . Atrial Fibrillation    History of Present Illness: [name] is an 84 y.o. female that I have not seen since February 2018. Record review finds recent hospitalization with discharge on January 15 following management of rapid atrial fibrillation and also acute on chronic systolic heart failure. She was not evaluated by cardiology. [name] She states that right around Christmas time she began to feel more fatigued, reportedly saw Dr. Nadara Mustard and was diagnosed with atrial fibrillation resulting in an increase in her Toprol-XL dose. Since that time she has continued to be fatigued and short of breath with activity, occasionally has a sense of palpitations as well.  She has a history of paroxysmal atrial fibrillation with no recurrent arrhythmia in the last few years. She also has a history of cardiomyopathy with LVEF 35% although improved on medical therapy and rhythm control. Follow-up echocardiogram in December 2018 revealed LVEF back the 30-35% range.  Today we discussed her recurrent atrial fibrillation, possibility that it is contributing to some of her recent symptoms, and also the potential for arranging a cardioversion. She is on Xarelto and at this time and heart rate control is better on a combination of Toprol-XL and Cardizem CD which was just recently started.        Past Medical History:  Diagnosis Date  . Arthritis   . Atrial flutter ([place])   . Coronary atherosclerosis of native coronary artery    BMS RCA May 2014 - [place] stent  . Depression   . Essential hypertension, benign   . Hyperlipemia   . Macular degeneration   . Paroxysmal atrial fibrillation (HCC)   . Secondary cardiomyopathy (HCC)    LVEF 35% - improved to 45-50%  . Ulcerative colitis          Past Surgical History:  Procedure Laterality Date  . ABDOMINAL HYSTERECTOMY    . APPENDECTOMY    . Bilateral knee replacements      2007, 2008  . BLADDER SURGERY    . COLONOSCOPY N/A 06/15/2015   Procedure: COLONOSCOPY;  Surgeon: Rogene Houston, MD;  [facility];  Service: Endoscopy;  Laterality: N/A;  210  . CYSTOSCOPY W/ RETROGRADES Bilateral 05/14/2016   Procedure: CYSTOSCOPY WITH BILATERAL RETROGRADE PYELOGRAM;  Surgeon: Raynelle Bring, MD;  Location: [facility];  Service: Urology;  Laterality: Bilateral;  GENERAL ANESTHESIA WITH PARALYSIS  . LEFT HEART CATHETERIZATION WITH CORONARY ANGIOGRAM N/A 10/30/2013   Procedure: LEFT HEART CATHETERIZATION WITH CORONARY ANGIOGRAM;  Surgeon: Burnell Blanks, MD;  Location: [facility] CATH LAB;  Service: Cardiovascular;  Laterality: N/A;  . TONSILLECTOMY    . TOTAL KNEE ARTHROPLASTY    . TRANSURETHRAL RESECTION OF BLADDER TUMOR N/A 05/14/2016   Procedure: TRANSURETHRAL RESECTION OF BLADDER TUMOR (TURBT);  Surgeon: Raynelle Bring,  MD;  Location: [facility];  Service: Urology;  Laterality: N/A;  GENERAL ANESTHESIA WITH PARALYSIS  . YAG LASER APPLICATION Bilateral 24/05/8097   Procedure: YAG LASER APPLICATION;  Surgeon: Williams Che, MD;  Location: [facility];  Service: Ophthalmology;  Laterality: Bilateral;          Current Outpatient Medications  Medication Sig Dispense Refill  . acetaminophen (TYLENOL ARTHRITIS PAIN) 650 MG CR tablet Take 1,300 mg by mouth every 8 (eight) hours.     Marland Kitchen aspirin 81 MG tablet  Take 1 tablet (81 mg total) by mouth daily.    Marland Kitchen atorvastatin (LIPITOR) 20 MG tablet TAKE ONE TABLET BY MOUTH AT BEDTIME 30 tablet 3  . balsalazide (COLAZAL) 750 MG capsule TAKE THREE CAPSULES BY MOUTH THREE TIMES DAILY (REPLACING DELZICOL) (Patient taking differently: TAKE THREE 750 mg CAPSULES (2261m) BY MOUTH THREE TIMES DAILY (REPLACING DELZICOL)) 270 capsule 3  . diltiazem (CARDIZEM CD) 120 MG 24 hr capsule Take 1 capsule (120 mg total) by mouth daily. 30 capsule 0  . furosemide (LASIX) 40 MG tablet Take 1 tablet (40 mg total) by mouth daily. 30 tablet 6  . ketotifen (THERATEARS ALLERGY) 0.025 % ophthalmic solution Place 2 drops into both eyes 3 (three) times daily as needed (for dry eyes).     .Marland Kitchenlosartan (COZAAR) 25 MG tablet TAKE 1 TABLET BY MOUTH EVERY DAY 90 tablet 3  . metoprolol succinate (TOPROL-XL) 25 MG 24 hr tablet Take 1 tablet (25 mg total) by mouth daily. 60 tablet 3  . Multiple Vitamins-Minerals (ICAPS AREDS 2 PO) Take 2 tablets by mouth at bedtime.     . nitroGLYCERIN (NITROSTAT) 0.4 MG SL tablet Place 1 tablet (0.4 mg total) under the tongue every 5 (five) minutes as needed for chest pain. 25 tablet 3  . potassium chloride SA (K-DUR,KLOR-CON) 20 MEQ tablet Take 1 tablet (20 mEq total) by mouth daily. 30 tablet 6  . XARELTO 15 MG TABS tablet TAKE ONE TABLET BY MOUTH DAILY 30 tablet 3   No current facility-administered medications for this visit.    Allergies:  Sinus & allergy [chlorpheniramine-phenylephrine]; Demerol; and Penicillins   Social History: The patient  reports that  has never smoked. she has never used smokeless tobacco. She reports that she does not drink alcohol or use drugs.   Family History: The patient's family history is not on file.   ROS:  Please see the history of present illness. Otherwise, complete review of systems is positive for shortness of breath, fatigue, intermittent palpitations, mild leg swelling.  All other systems are reviewed and  negative.   Physical Exam: VS:  BP 122/82   Pulse 63   Ht 5' 3"  (1.6 m)   Wt 231 lb (104.8 kg)   SpO2 95%   BMI 40.92 kg/m , BMI Body mass index is 40.92 kg/m.     Wt Readings from Last 3 Encounters:  01/25/17 231 lb (104.8 kg)  01/22/17 229 lb 1.6 oz (103.9 kg)  01/03/17 233 lb 11 oz (106 kg)    General: Elderly woman, no distress. HEENT: Conjunctiva and lids normal, oropharynx clear. Neck: Supple, no elevated JVP or carotid bruits, no thyromegaly. Lungs: Clear to auscultation, nonlabored breathing at rest. Cardiac: Irregularly irregular, no S3, soft systolic murmur, no pericardial rub. Abdomen: Soft, nontender, bowel sounds present. Extremities: Mild ankle edema, distal pulses 1-2+. Skin: Warm and dry. Musculoskeletal: No kyphosis. Neuropsychiatric: Alert and oriented x3, affect grossly appropriate.  ECG: I personally reviewed the tracing  from 01/21/2017 which showed atrial fibrillation with RVR and left bundle Julene Rahn block.  Recent Labwork: 01/02/2017: ALT 13; AST 22; B Natriuretic Peptide 185.0; TSH 1.970 01/21/2017: Hemoglobin 11.6; Platelets 313 01/22/2017: BUN 22; Creatinine, Ser 0.79; Potassium 4.0; Sodium 139   Other Studies Reviewed Today:  Echocardiogram 01/03/2017: Study Conclusions  - Left ventricle: The cavity size was normal. Wall thickness was normal. Systolic function was moderately to severely reduced. The estimated ejection fraction was in the range of 30% to 35%. - Left atrium: The atrium was mildly dilated.  Impressions:  - Poor acoustic windows limit study Compared to report from 2015 LVEF is depressed.  Assessment and Plan:  1. Persistent atrial fibrillation. Onset not entirely clear but potentially around Christmas when she saw Dr. Nadara Mustard in his office. She is symptomatic with fatigue and shortness of breath, also heart failure symptoms with associated cardiomyopathy which could be tachycardia-mediated based on her history in  the past. She was recently managed on the hospitalist team at [facility], cardiology was not consulted. I reviewed her records and we have discussed arranging an elective cardioversion next week to try and get her back in sinus rhythm. She will continue on Xarelto at current dose (CKD stage 2 based on creatinine clearance). Continue Toprol-XL and Cardizem CD, but plan to hold Cardizem CD on the morning of cardioversion. If she continues to manifest episodes of breakthrough atrial fibrillation, antiarrhythmic therapy or referral to EP will need to be considered.  2. Cardiomyopathy, LVEF down in the 30-35% range by recent echocardiogram. Possibly tachycardia-mediated. This will need to be reassessed once she is back in sinus rhythm and more stable.  3. CAD status post BMS to the RCA in 2014. No active angina symptoms. Troponin I levels negative.  4. Essential hypertension, blood pressure well controlled today.  Current medicines were reviewed with the patient today.  Disposition: Schedule elective cardioversion for next week.  Signed, Satira Sark, MD, [facility] 01/25/2017 1:11 PM    [facility] [place], [place], [place] [address] Phone: [phone]; Fax: [phone]          Electronically signed by Satira Sark, MD at 01/25/2017 1:12 PM

## 2017-02-01 NOTE — ED Provider Notes (Addendum)
[facility] EMERGENCY DEPARTMENT Provider Note   CSN: [id number] Arrival date & time: 02/01/17  0932     History   Chief Complaint Chief Complaint  Patient presents with  . Atrial Fibrillation    HPI [name] is a 84 y.o. female.  Patient with history of atrial flutter on Xarelto missed the last 2 doses, coronary artery disease, high blood pressure and lipids was sent over by Dr. Harl Bowie for further evaluation. Patient is scheduled to have cardioversion however she missed 2 doses Xarelto and has had lower chest/upper abdominal pressure and feels more fatigued and shortness of breath and normal. Patient has been compliant with other medications.      Past Medical History:  Diagnosis Date  . Arthritis   . Atrial flutter ([place])   . Coronary atherosclerosis of native coronary artery    BMS RCA May 2014 - [place] stent  . Depression   . Essential hypertension, benign   . Hyperlipemia   . Macular degeneration   . Paroxysmal atrial fibrillation (HCC)   . Secondary cardiomyopathy (HCC)    LVEF 35% - improved to 45-50%  . Ulcerative colitis     Patient Active Problem List   Diagnosis Date Noted  . Acute systolic CHF (congestive heart failure) ([place]) 01/21/2017  . Atrial fibrillation with RVR ([place]) 01/02/2017  . Arthritis of midfoot 01/13/2016  . Right shoulder pain 01/13/2016  . Ulcerative colitis ([place]) 05/03/2014  . Bilateral leg edema 11/30/2013  . Abnormal myocardial perfusion study 09/17/2013  . Coronary atherosclerosis of native coronary artery 09/03/2012  . Secondary cardiomyopathy ([place]) 09/03/2012  . Mixed hyperlipidemia 01/21/2009  . Essential hypertension, benign 01/21/2009  . ATRIAL FLUTTER 10/06/2008    Past Surgical History:  Procedure Laterality Date  . ABDOMINAL HYSTERECTOMY    . APPENDECTOMY    . Bilateral knee replacements      2007, 2008  . BLADDER SURGERY    . COLONOSCOPY N/A 06/15/2015   Procedure: COLONOSCOPY;  Surgeon: Rogene Houston, MD;  [facility];  Service: Endoscopy;  Laterality: N/A;  210  . CYSTOSCOPY W/ RETROGRADES Bilateral 05/14/2016   Procedure: CYSTOSCOPY WITH BILATERAL RETROGRADE PYELOGRAM;  Surgeon: Raynelle Bring, MD;  Location: [facility] ORS;  Service: Urology;  Laterality: Bilateral;  GENERAL ANESTHESIA WITH PARALYSIS  . LEFT HEART CATHETERIZATION WITH CORONARY ANGIOGRAM N/A 10/30/2013   Procedure: LEFT HEART CATHETERIZATION WITH CORONARY ANGIOGRAM;  Surgeon: Burnell Blanks, MD;  Location: [facility] C[facility] LAB;  Service: Cardiovascular;  Laterality: N/A;  . TONSILLECTOMY    . TOTAL KNEE ARTHROPLASTY    . TRANSURETHRAL RESECTION OF BLADDER TUMOR N/A 05/14/2016   Procedure: TRANSURETHRAL RESECTION OF BLADDER TUMOR (TURBT);  Surgeon: Raynelle Bring, MD;  Location: [facility] ORS;  Service: Urology;  Laterality: N/A;  GENERAL ANESTHESIA WITH PARALYSIS  . YAG LASER APPLICATION Bilateral 21/01/9415   Procedure: YAG LASER APPLICATION;  Surgeon: Williams Che, MD;  Location: [facility] ORS;  Service: Ophthalmology;  Laterality: Bilateral;    OB History    No data available       Home Medications    Prior to Admission medications   Medication Sig Start Date End Date Taking? Authorizing Provider  acetaminophen (TYLENOL ARTHRITIS PAIN) 650 MG CR tablet Take 1,300 mg by mouth every 8 (eight) hours.    Yes [provider]  ASPIRIN ADULT LOW STRENGTH 81 MG EC tablet Take 81 mg by mouth daily. 01/25/17  Yes [provider]  atorvastatin (LIPITOR) 20 MG tablet TAKE ONE  TABLET BY MOUTH AT BEDTIME 10/08/16  Yes Satira Sark, MD  balsalazide (COLAZAL) 750 MG capsule TAKE THREE CAPSULES BY MOUTH THREE TIMES DAILY (REPLACING DELZICOL) Patient taking differently: TAKE THREE CAPSULES (2250 MG) BY MOUTH THREE TIMES DAILY (REPLACING DELZICOL) 12/09/16  Yes Rehman, Mechele Dawley, MD  diltiazem (CARDIZEM CD) 120 MG 24 hr capsule Take 1 capsule (120 mg total) by mouth daily. 01/23/17  Yes Debbe Odea, MD  furosemide  (LASIX) 40 MG tablet Take 1 tablet (40 mg total) by mouth daily. 01/07/17  Yes Satira Sark, MD  ketotifen [facility] ALLERGY) 0.025 % ophthalmic solution Place 2 drops into both eyes 3 (three) times daily as needed (for dry eyes).    Yes [provider]  losartan (COZAAR) 25 MG tablet TAKE 1 TABLET BY MOUTH EVERY DAY 02/20/16  Yes Satira Sark, MD  metoprolol succinate (TOPROL-XL) 25 MG 24 hr tablet Take 1 tablet (25 mg total) by mouth daily. 01/25/17  Yes Satira Sark, MD  Multiple Vitamins-Minerals (ICAPS AREDS 2 PO) Take 2 tablets by mouth daily at 6 PM.    Yes [provider]  potassium chloride SA (K-DUR,KLOR-CON) 20 MEQ tablet Take 1 tablet (20 mEq total) by mouth daily. 01/07/17  Yes Satira Sark, MD  XARELTO 15 MG TABS tablet TAKE ONE TABLET BY MOUTH DAILY 10/08/16  Yes Satira Sark, MD  nitroGLYCERIN (NITROSTAT) 0.4 MG SL tablet Place 1 tablet (0.4 mg total) under the tongue every 5 (five) minutes as needed for chest pain. 02/28/16 01/28/18  Satira Sark, MD    Family History History reviewed. No pertinent family history.  Social History Social History   Tobacco Use  . Smoking status: Never Smoker  . Smokeless tobacco: Never Used  Substance Use Topics  . Alcohol use: No    Alcohol/week: 0.0 oz  . Drug use: No     Allergies   Sinus & allergy [chlorpheniramine-phenylephrine]; Demerol; and Penicillins   Review of Systems Review of Systems  Constitutional: Positive for fatigue. Negative for chills and fever.  HENT: Negative for congestion.   Eyes: Negative for visual disturbance.  Respiratory: Positive for shortness of breath.   Cardiovascular: Positive for chest pain.  Gastrointestinal: Negative for abdominal pain and vomiting.  Genitourinary: Negative for dysuria and flank pain.  Musculoskeletal: Negative for back pain, neck pain and neck stiffness.  Skin: Negative for rash.  Neurological: Negative for light-headedness  and headaches.     Physical Exam Updated Vital Signs BP (!) 132/117 (BP Location: Left Arm)   Pulse 98   Temp 98.3 F (36.8 C)   Resp (!) 25   Wt 104.8 kg (231 lb)   SpO2 95%   BMI 40.92 kg/m   Physical Exam  Constitutional: She is oriented to person, place, and time. She appears well-developed and well-nourished.  HENT:  Head: Normocephalic and atraumatic.  Eyes: Conjunctivae are normal. Right eye exhibits no discharge. Left eye exhibits no discharge.  Neck: Normal range of motion. Neck supple. No tracheal deviation present.  Cardiovascular: Normal rate and regular rhythm.  Pulmonary/Chest: Effort normal. She has rales (bases).  Abdominal: Soft. She exhibits no distension. There is no tenderness. There is no guarding.  Musculoskeletal: She exhibits edema (mild bilateral le).  Neurological: She is alert and oriented to person, place, and time.  Skin: Skin is warm. No rash noted.  Psychiatric: She has a normal mood and affect.  Nursing note and vitals reviewed.    ED Treatments /  Results  Labs (all labs ordered are listed, but only abnormal results are displayed) Labs Reviewed  CBC WITH DIFFERENTIAL/PLATELET - Abnormal; Notable for the following components:      Result Value   Hemoglobin 11.0 (*)    HCT 35.1 (*)    All other components within normal limits  COMPREHENSIVE METABOLIC PANEL - Abnormal; Notable for the following components:   ALT 12 (*)    All other components within normal limits  BRAIN NATRIURETIC PEPTIDE - Abnormal; Notable for the following components:   B Natriuretic Peptide 253.0 (*)    All other components within normal limits  TROPONIN I  LIPASE, BLOOD  TSH    EKG  EKG Interpretation None      EKG reviewed atrial fibrillation heart rate 98, left bundle branch block, normal QT. Radiology Dg Chest 2 View  Result Date: 02/01/2017 CLINICAL DATA:  Chest pain and shortness of breath EXAM: CHEST  2 VIEW COMPARISON:  January 21, 2017 FINDINGS:  There is interstitial pulmonary edema. There is no airspace consolidation. There is pulmonary venous hypertension with cardiomegaly. There is no evident adenopathy. There is degenerative change in each shoulder. There is superior migration of the right humeral head. IMPRESSION: Interstitial edema with cardiomegaly and pulmonary venous hypertension. There is likely a degree of congestive heart failure. No consolidation. No adenopathy. Degenerative change in each shoulder with probable chronic rotator cuff tear on the right. Electronically Signed   By: Lowella Grip III M.D.   On: 02/01/2017 13:40    Procedures Procedures (including critical care time)  Medications Ordered in ED Medications  furosemide (LASIX) injection 40 mg (not administered)  acetaminophen (TYLENOL) tablet 1,000 mg (1,000 mg Oral Given 02/01/17 1259)     Initial Impression / Assessment and Plan / ED Course  I have reviewed the triage vital signs and the nursing notes.  Pertinent labs & imaging results that were available during my care of the patient were reviewed by me and considered in my medical decision making (see chart for details).     Patient presents with worsening shortness of breath and fatigue. Her cardioversion was canceled. On exam concern for acute CHF. Discussed results with cardiology recommends admission by hospitalist for diuresis and they will reschedule her cardioversion for Monday morning. Lasix IV given for diuresis.  The patients results and plan were reviewed and discussed.   Any x-rays performed were independently reviewed by myself.   Differential diagnosis were considered with the presenting HPI.  Medications  furosemide (LASIX) injection 40 mg (not administered)  acetaminophen (TYLENOL) tablet 1,000 mg (1,000 mg Oral Given 02/01/17 1259)    Vitals:   02/01/17 1025 02/01/17 1030 02/01/17 1109 02/01/17 1300  BP:      Pulse: (!) 54 91 (!) 43 98  Resp: 18 19 (!) 21 (!) 25  Temp:        TempSrc:      SpO2: 98% 98% 98% 95%  Weight:        Final diagnoses:  Acute congestive heart failure, unspecified heart failure type (HCC)  Acute dyspnea  Persistent atrial fibrillation ([place])    Admission/ observation were discussed with the admitting physician, patient and/or family and they are comfortable with the plan.    Final Clinical Impressions(s) / ED Diagnoses   Final diagnoses:  Acute congestive heart failure, unspecified heart failure type ([place])  Acute dyspnea  Persistent atrial fibrillation [facility])    ED Discharge Orders    None  Elnora Morrison, MD 02/01/17 1402    Elnora Morrison, MD 02/01/17 [phone]

## 2017-02-01 NOTE — Progress Notes (Addendum)
Patient reports she missed a dose of xarelto on Jan 15. She has not completed 3 weeks of uninterrupted anticoagulation, and thus the procedure was cancelled. Notified later in the morning after procedure cancelled that patient reported some chest pain in the preoperative area, I have asked that her initial workup begin in the ER to expedite her evaluation. May contact us if needed after initial evaluation if indicated.    Carlyle Dolly MD

## 2017-02-01 NOTE — Consult Note (Signed)
Cardiology Consultation:   Patient ID: [name]; [id number]; [birth year]   Admit date: 02/01/2017 Date of Consult: 02/01/2017  Primary Care Provider: Rory Percy, MD Primary Cardiologist: Dr Domenic Polite Primary Electrophysiologist:  n/a   Patient Profile:   [name] is a 84 y.o. female with a hx of chronic systolic HF and persistent afib who is being seen today for the evaluation of SOBat the request of Dr Reather Converse  History of Present Illness:   [name] 84 yo female histor of CAD with prior BMS to RCA May 2014 in [place], persistent afib, chronic systolic HF LVEF 93-79% from echo 12/2016. This was a new drop from her prior 2015 echo where LVEF was 45-50%.  Patient presented today for outpatient cardioversion. It was discovered that she had missed a dose of xarelto Jan 15 and had not disclosed this to any of her providers or nursing staff. Procedure was cancelled. After cancellation prior to discharge she reported symptoms of SOB and was sent to ER.  She reports ongoing SOB essentially since her last discharge that has progressed. Reports some edema. Can have palpitations. Nonspecific chest pressure at times, not exertional.    12/2016 echo: LVEF 30-35%,  Jan 23 labs: K 3.9, Cr 0.80, Hgb 11.6, Plt 365,  CXR pending EKG afib chroinc nonspecific conduction delay    Past Medical History:  Diagnosis Date  . Arthritis   . Atrial flutter ([place])   . Coronary atherosclerosis of native coronary artery    BMS RCA May 2014 - [place] stent  . Depression   . Essential hypertension, benign   . Hyperlipemia   . Macular degeneration   . Paroxysmal atrial fibrillation (HCC)   . Secondary cardiomyopathy (HCC)    LVEF 35% - improved to 45-50%  . Ulcerative colitis     Past Surgical History:  Procedure Laterality Date  . ABDOMINAL HYSTERECTOMY    . APPENDECTOMY    . Bilateral knee replacements      2007, 2008  . BLADDER SURGERY    . COLONOSCOPY N/A 06/15/2015   Procedure: COLONOSCOPY;  Surgeon: Rogene Houston, MD;  [facility];  Service: Endoscopy;  Laterality: N/A;  210  . CYSTOSCOPY W/ RETROGRADES Bilateral 05/14/2016   Procedure: CYSTOSCOPY WITH BILATERAL RETROGRADE PYELOGRAM;  Surgeon: Raynelle Bring, MD;  Location: [facility];  Service: Urology;  Laterality: Bilateral;  GENERAL ANESTHESIA WITH PARALYSIS  . LEFT HEART CATHETERIZATION WITH CORONARY ANGIOGRAM N/A 10/30/2013   Procedure: LEFT HEART CATHETERIZATION WITH CORONARY ANGIOGRAM;  Surgeon: Burnell Blanks, MD;  Location: [facility] CATH LAB;  Service: Cardiovascular;  Laterality: N/A;  . TONSILLECTOMY    . TOTAL KNEE ARTHROPLASTY    . TRANSURETHRAL RESECTION OF BLADDER TUMOR N/A 05/14/2016   Procedure: TRANSURETHRAL RESECTION OF BLADDER TUMOR (TURBT);  Surgeon: Raynelle Bring, MD;  Location: [facility];  Service: Urology;  Laterality: N/A;  GENERAL ANESTHESIA WITH PARALYSIS  . YAG LASER APPLICATION Bilateral 02/12/971   Procedure: YAG LASER APPLICATION;  Surgeon: Williams Che, MD;  Location: [facility] ORS;  Service: Ophthalmology;  Laterality: Bilateral;       Inpatient Medications: Scheduled Meds: . acetaminophen  1,000 mg Oral Once   Continuous Infusions:  PRN Meds:   Allergies:    Allergies  Allergen Reactions  . Sinus & Allergy [Chlorpheniramine-Phenylephrine] Shortness Of Breath  . Demerol Rash  . Penicillins Rash and Other (See Comments)    REACTION: rash, years ago Has patient had a PCN reaction causing immediate rash, facial/tongue/throat swelling, SOB  or lightheadedness with hypotension: Yes Has patient had a PCN reaction causing severe rash involving mucus membranes or skin necrosis: No Has patient had a PCN reaction that required hospitalization No Has patient had a PCN reaction occurring within the last 10 years: No If all of the above answers are "NO", then may proceed with Cephalosporin use.     Social History:   Social History   Socioeconomic History  .  Marital status: Married    Spouse name: Not on file  . Number of children: Not on file  . Years of education: Not on file  . Highest education level: Not on file  Social Needs  . Financial resource strain: Not on file  . Food insecurity - worry: Not on file  . Food insecurity - inability: Not on file  . Transportation needs - medical: Not on file  . Transportation needs - non-medical: Not on file  Occupational History  . Not on file  Tobacco Use  . Smoking status: Never Smoker  . Smokeless tobacco: Never Used  Substance and Sexual Activity  . Alcohol use: No    Alcohol/week: 0.0 oz  . Drug use: No  . Sexual activity: Not Currently  Other Topics Concern  . Not on file  Social History Narrative  . Not on file    Family History:   History reviewed. No pertinent family history.   ROS:  Please see the history of present illness.   All other ROS reviewed and negative.     Physical Exam/Data:   Vitals:   02/01/17 1020 02/01/17 1025 02/01/17 1030 02/01/17 1109  BP:      Pulse: 85 (!) 54 91 (!) 43  Resp: 18 18 19  (!) 21  Temp:      TempSrc:      SpO2: 97% 98% 98% 98%  Weight:       No intake or output data in the 24 hours ending 02/01/17 1150 Filed Weights   02/01/17 0954  Weight: 231 lb (104.8 kg)   Body mass index is 40.92 kg/m.  General:  Well nourished, well developed, in no acute distress HEENT: normal Lymph: no adenopathy Neck: mildly elevated JVD Endocrine:  No thryomegaly Cardiac:  normal S1, S2; RRR; no murmur Lungs:  clear to auscultation bilaterally, no wheezing, rhonchi or rales  Abd: soft, nontender, no hepatomegaly  Ext: 1+ bilateral edema Musculoskeletal:  No deformities, BUE and BLE strength normal and equal Skin: warm and dry  Neuro:  CNs 2-12 intact, no focal abnormalities noted Psych:  Normal affect    Laboratory Data:  Chemistry Recent Labs  Lab 01/30/17 1416  NA 137  K 3.9  CL 102  CO2 24  GLUCOSE 100*  BUN 18  CREATININE  0.80  CALCIUM 9.2  GFRNONAA >60  GFRAA >60  ANIONGAP 11    No results for input(s): PROT, ALBUMIN, AST, ALT, ALKPHOS, BILITOT in the last 168 hours. Hematology Recent Labs  Lab 01/30/17 1416  WBC 8.1  RBC 4.35  HGB 11.6*  HCT 36.8  MCV 84.6  MCH 26.7  MCHC 31.5  RDW 14.9  PLT 365   Cardiac EnzymesNo results for input(s): TROPONINI in the last 168 hours. No results for input(s): TROPIPOC in the last 168 hours.  BNPNo results for input(s): BNP, PROBNP in the last 168 hours.  DDimer No results for input(s): DDIMER in the last 168 hours.  Radiology/Studies:  No results found.  Assessment and Plan:   1. Acute on Chronic  systolic HF - fairly new diagnosis of systolic dysfunction last month. Thought possible tachycardia mediated as afib was diagnosed around the same time. The patient does also have a history of CAD - medical therapy with losartan 25, Toprol 25. We will see if we can wean dilt and titrate up beta blocker for additional systolic HF benefits. Avoid too aggressive titration prior to DCCV Monday.  - would diurese with lasix IV 36m bid, will give dose in ER.  - titrate up Toprol to 584mdaily, d/c diltiazem.  - please ask pharm to dose her xarelto to be sure of appropriate dosing for her renal function.    2. Peristent afib - remains symptomsatic plans for DCCV today however cancelled as patient had missed a dose of anticoagulation on Jan 15 - plan for TEE/DCCV on Monday, please make npo at midnight Sunday night  3. CAD - cycle enzymes, EKG with chronic conduction delay - pending LVEF and symptoms response to better controlled afib over time, may warrant an ischemic evaluation  - nonspecific chest tightness at times, follow symptoms with diuresis and better control of her afib.     For questions or updates, please contact [facility] consult [email or web address] for contact info under Cardiology/STEMI.   SiMerrily PewMD  02/01/2017 11:50  AM

## 2017-02-02 DIAGNOSIS — R079 Chest pain, unspecified: Secondary | ICD-10-CM

## 2017-02-02 LAB — BASIC METABOLIC PANEL
ANION GAP: 12 (ref 5–15)
BUN: 21 mg/dL — ABNORMAL HIGH (ref 6–20)
CALCIUM: 9.2 mg/dL (ref 8.9–10.3)
CO2: 24 mmol/L (ref 22–32)
Chloride: 102 mmol/L (ref 101–111)
Creatinine, Ser: 0.74 mg/dL (ref 0.44–1.00)
GFR calc non Af Amer: >60 mL/min (ref >60)
Glucose, Bld: 94 mg/dL (ref 65–99)
Potassium: 3.9 mmol/L (ref 3.5–5.1)
Sodium: 138 mmol/L (ref 135–145)

## 2017-02-02 LAB — TROPONIN I: Troponin I: <0.03 ng/mL (ref <0.03)

## 2017-02-02 LAB — CBC
HCT: 36.5 % (ref 36.0–46.0)
HEMOGLOBIN: 11.3 g/dL — AB (ref 12.0–15.0)
MCH: 26.3 pg (ref 26.0–34.0)
MCHC: 31 g/dL (ref 30.0–36.0)
MCV: 85.1 fL (ref 78.0–100.0)
PLATELETS: 345 10*3/uL (ref 150–400)
RBC: 4.29 MIL/uL (ref 3.87–5.11)
RDW: 15.2 % (ref 11.5–15.5)
WBC: 6.9 10*3/uL (ref 4.0–10.5)

## 2017-02-02 MED ORDER — RIVAROXABAN 20 MG PO TABS
20.0000 mg | ORAL_TABLET | Freq: Every day | ORAL | Status: DC
  Administered 2017-02-02 – 2017-02-04 (×3): 20 mg via ORAL
  Filled 2017-02-02 (×3): qty 1

## 2017-02-02 NOTE — Progress Notes (Signed)
Ambulated to bathroom with assist earlier.  Felt unsteady on feet but bore wt well. Voided.  Sacral dressing applied.  Husband at bedside today

## 2017-02-02 NOTE — Progress Notes (Signed)
Patient Demographics:    [name], is a 84 y.o. female, DOB - [birth year], OIB:[id number]  Admit date - 02/01/2017   Admitting Physician Aadarsh Cozort Denton Brick, MD  Outpatient Primary MD for the patient is Rory Percy, MD  LOS - 1   Chief Complaint  Patient presents with  . Atrial Fibrillation        Subjective:    [name] today has no fevers, no emesis,  No chest pain,  , some DOE persist, husband at bedside, questions answered  Assessment  & Plan :    Principal Problem:   Chest pain Active Problems:   Essential hypertension, benign   Atrial flutter (HCC)   Atrial fibrillation with RVR [facility])   Brief Summary:-  84 year old female with history of A. fib/A flutter, CAD and systolic dysfunction CHF who presented on 02/01/17  for outpatient cardioversion, however It was discovered that she had missed a dose of xarelto Jan 22 2017, so  cardiologist Dr Harl Bowie recommended  TEE  And cardioversion (DCCV) on Monday, 02/04/2017, as patient has not had 3 weeks of uninterrupted anticoagulation.  Prior to being discharged home from the outpatient center patient developed chest pain she was admitted on 02/02/2016 for further management of chest pains, CHF and subsequent cardioversion  Plan:- 1)Afib/Flutter-  Plan is for TEE  And cardioversion (DCCV) on Monday, 02/04/2017, patient will be n.p.o. from Sunday night . Patient initially presented on 02/01/17  for outpatient cardioversion, however It was discovered that she had missed a dose of xarelto Jan 22 2017, so  cardiologist Dr Harl Bowie recommended  TEE  And cardioversion (DCCV) on Monday, 02/04/2017, as patient has not had 3 weeks of uninterrupted anticoagulation, continue Toprol-XL to 50 mg daily, continue Xarelto   2)CAD-resolved chest pains, troponins negative, prior RCA bare mental stent in 2014,   continue aspirin, Lipitor and Toprol  3)HFrEF-patient with  history of chronic systolic dysfunction CHF, no acute exacerbation at this time, LVEF 30-35% from echo 12/2016. This was a new drop from her prior 2015 echo where LVEF was 45-50%. ????  Secondary cardiomyopathy secondary/systolic dysfunction due to tachycardia in the setting of A. fib/A flutter, continue losartan 25 mg daily and Toprol-XL as ordered  4)Social/Ethics-advanced directives discussed patient is a full code  Disposition Plan  : Plan is for discharge home on 02/04/2017 after cardioversion  Consults  :  Cardiology   DVT Prophylaxis  :  Xarelto  Lab Results  Component Value Date   PLT 345 02/02/2017   Inpatient Medications  Scheduled Meds: . aspirin EC  81 mg Oral Daily  . atorvastatin  20 mg Oral QHS  . balsalazide  2,250 mg Oral TID  . furosemide  40 mg Oral Daily  . losartan  25 mg Oral Daily  . metoprolol succinate  50 mg Oral Daily  . multivitamin-lutein  1 capsule Oral q1800  . potassium chloride SA  20 mEq Oral Daily  . rivaroxaban  20 mg Oral Q supper  . senna  1 tablet Oral BID  . sodium chloride flush  3 mL Intravenous Q12H   Continuous Infusions: . sodium chloride     PRN Meds:.sodium chloride, acetaminophen **OR** acetaminophen, ketotifen, nitroGLYCERIN, ondansetron **OR** ondansetron (ZOFRAN) IV, polyethylene glycol, sodium chloride flush,  traZODone    Anti-infectives (From admission, onward)   None        Objective:   Vitals:   02/01/17 1858 02/01/17 2100 02/01/17 2200 02/02/17 0515  BP: (!) 143/89  107/65 122/71  Pulse: 78  96 88  Resp: 20  18 18   Temp: 98.2 F (36.8 C)  (!) 97.4 F (36.3 C) 97.7 F (36.5 C)  TempSrc: Oral  Oral Oral  SpO2: 95% 94% 96% 96%  Weight: 102.6 kg (226 lb 3.1 oz)     Height: 5' 3"  (1.6 m)       Wt Readings from Last 3 Encounters:  02/01/17 102.6 kg (226 lb 3.1 oz)  01/25/17 104.8 kg (231 lb)  01/22/17 103.9 kg (229 lb 1.6 oz)     Intake/Output Summary (Last 24 hours) at 02/02/2017 1437 Last data filed  at 02/02/2017 0600 Gross per 24 hour  Intake 800 ml  Output 200 ml  Net 600 ml     Physical Exam  Gen:- Awake Alert,  In no apparent distress  HEENT:- [place].AT, No sclera icterus Neck-Supple Neck,No JVD,.  Lungs-  CTAB  CV- S1, S2 normal, irregularly irregular Abd-  +ve B.Sounds, Abd Soft, No tenderness,    Extremity/Skin:- No  edema,    Psych-affect is appropriate Neuro-generalized weakness without new focal deficits    Data Review:   Micro Results No results found for this or any previous visit (from the past 240 hour(s)).  Radiology Reports Dg Chest 2 View  Result Date: 02/01/2017 CLINICAL DATA:  Chest pain and shortness of breath EXAM: CHEST  2 VIEW COMPARISON:  January 21, 2017 FINDINGS: There is interstitial pulmonary edema. There is no airspace consolidation. There is pulmonary venous hypertension with cardiomegaly. There is no evident adenopathy. There is degenerative change in each shoulder. There is superior migration of the right humeral head. IMPRESSION: Interstitial edema with cardiomegaly and pulmonary venous hypertension. There is likely a degree of congestive heart failure. No consolidation. No adenopathy. Degenerative change in each shoulder with probable chronic rotator cuff tear on the right. Electronically Signed   By: Lowella Grip III M.D.   On: 02/01/2017 13:40   Dg Chest 2 View  Result Date: 01/21/2017 CLINICAL DATA:  Shortness of breath and generalized weakness associated with shortness of breath. History of atrial flutter, hypertension, coronary artery disease. EXAM: CHEST  2 VIEW COMPARISON:  Chest x-ray of January 02, 2017 FINDINGS: The lungs are adequately inflated. The interstitial markings are increased diffusely. The pulmonary vascularity is engorged. The cardiac silhouette remains enlarged. No alveolar infiltrates or edema is observed. There is no pleural effusion. There are degenerative changes of both shoulders. IMPRESSION: CHF with interstitial  edema. No discrete pneumonia. The appearance of the chest has worsened since the previous study. Electronically Signed   By: David  Martinique M.D.   On: 01/21/2017 10:07     CBC Recent Labs  Lab 01/30/17 1416 02/01/17 1212 02/02/17 0656  WBC 8.1 6.5 6.9  HGB 11.6* 11.0* 11.3*  HCT 36.8 35.1* 36.5  PLT 365 328 345  MCV 84.6 85.2 85.1  MCH 26.7 26.7 26.3  MCHC 31.5 31.3 31.0  RDW 14.9 15.0 15.2  LYMPHSABS 2.1 1.9  --   MONOABS 0.8 0.6  --   EOSABS 0.2 0.2  --   BASOSABS 0.0 0.0  --     Chemistries  Recent Labs  Lab 01/30/17 1416 02/01/17 1212 02/02/17 0656  NA 137 139 138  K 3.9 3.9 3.9  CL  102 104 102  CO2 24 24 24   GLUCOSE 100* 99 94  BUN 18 18 21*  CREATININE 0.80 0.65 0.74  CALCIUM 9.2 9.1 9.2  AST  --  18  --   ALT  --  12*  --   ALKPHOS  --  51  --   BILITOT  --  1.2  --    ------------------------------------------------------------------------------------------------------------------ No results for input(s): CHOL, HDL, LDLCALC, TRIG, CHOLHDL, LDLDIRECT in the last 72 hours.  No results found for: HGBA1C ------------------------------------------------------------------------------------------------------------------ Recent Labs    02/01/17 1212  TSH 1.783   ------------------------------------------------------------------------------------------------------------------ No results for input(s): VITAMINB12, FOLATE, FERRITIN, TIBC, IRON, RETICCTPCT in the last 72 hours.  Coagulation profile No results for input(s): INR, PROTIME in the last 168 hours.  No results for input(s): DDIMER in the last 72 hours.  Cardiac Enzymes Recent Labs  Lab 02/01/17 1855 02/02/17 0059 02/02/17 0656  TROPONINI <0.03 <0.03 <0.03   ------------------------------------------------------------------------------------------------------------------    Component Value Date/Time   BNP 253.0 (H) 02/01/2017 1212     Roxan Hockey M.D on 02/02/2017 at 2:37 PM  Between  7am to 7pm - Pager - [phone]  After 7pm go to [email or web address] - password [email or web address]  [facility] -  Office  [phone]   Voice Recognition Viviann Spare dictation system was used to create this note, attempts have been made to correct errors. Please contact the author with questions and/or clarifications.

## 2017-02-02 NOTE — Progress Notes (Signed)
Has denied chest pain today.  States sob on exertion.  Lungs clear.  Purwick in place.  Husband at bedside.

## 2017-02-03 DIAGNOSIS — I509 Heart failure, unspecified: Secondary | ICD-10-CM

## 2017-02-03 NOTE — Progress Notes (Signed)
Patient Demographics:    [name], is a 84 y.o. female, DOB - 1930/08/25, KDT:[id number]  Admit date - 02/01/2017   Admitting Physician Leane Loring Denton Brick, MD  Outpatient Primary MD for the patient is Rory Percy, MD  LOS - 2   Chief Complaint  Patient presents with  . Atrial Fibrillation        Subjective:    [name] today has no fevers, no emesis,  No chest pain, no shortness of breath at rest, ambulated to bathroom with minimal dyspnea on exertion,  Assessment  & Plan :    Principal Problem:   Chest pain Active Problems:   Essential hypertension, benign   Atrial flutter (HCC)   Atrial fibrillation with RVR [facility])   Brief Summary:-  84 year old female with history of A. fib/A flutter, CAD and systolic dysfunction CHF who presented on 02/01/17  for outpatient cardioversion, however It was discovered that she had missed a dose of xarelto Jan 15 201 and possibly a second dose, so  cardiologist Dr Harl Bowie recommended  TEE  And cardioversion (DCCV) on Monday, 02/04/2017, as patient has not had 3 weeks of uninterrupted anticoagulation.  Prior to being discharged home from the outpatient center patient developed chest pain so she was admitted on 02/02/2016 for further management of chest pains, CHF and subsequent cardioversion   Plan:- 1)Afib/Flutter-  Plan is for TEE  And cardioversion (DCCV) on Monday, 02/04/2017, patient will be n.p.o. from Sunday night . Patient initially presented on 02/01/17  for outpatient cardioversion, however It was discovered that she had missed a dose of xarelto Jan 22 2017, so cardiologist Dr Harl Bowie recommended  TEE  And cardioversion (DCCV) on Monday, 02/04/2017, as patient has not had 3 weeks of uninterrupted anticoagulation, continue Toprol-XL to 50 mg daily, continue Xarelto   2)CAD-resolved chest pains, troponins negative, prior RCA bare mental stent in 2014,    continue aspirin, Lipitor and Toprol  3)HFrEF-patient with history of chronic systolic dysfunction CHF, no acute exacerbation at this time, LVEF 30-35% from echo 12/2016. This was a new drop from her prior 2015 echo where LVEF was 45-50%.  Possibly secondary cardiomyopathy secondary/systolic dysfunction due to tachycardia in the setting of A. fib/A flutter, continue losartan 25 mg daily and Toprol-XL as ordered  4)Social/Ethics-advanced directives discussed,  patient is a full code  Disposition Plan  : Plan is for possible discharge home on 02/04/2017 after cardioversion  Consults  :  Cardiology  DVT Prophylaxis  :  Xarelto  Lab Results  Component Value Date   PLT 345 02/02/2017   Inpatient Medications  Scheduled Meds: . aspirin EC  81 mg Oral Daily  . atorvastatin  20 mg Oral QHS  . balsalazide  2,250 mg Oral TID  . furosemide  40 mg Oral Daily  . losartan  25 mg Oral Daily  . metoprolol succinate  50 mg Oral Daily  . multivitamin-lutein  1 capsule Oral q1800  . potassium chloride SA  20 mEq Oral Daily  . rivaroxaban  20 mg Oral Q supper  . senna  1 tablet Oral BID  . sodium chloride flush  3 mL Intravenous Q12H   Continuous Infusions: . sodium chloride     PRN Meds:.sodium chloride, acetaminophen **OR** acetaminophen, ketotifen,  nitroGLYCERIN, ondansetron **OR** ondansetron (ZOFRAN) IV, polyethylene glycol, sodium chloride flush, traZODone   Anti-infectives (From admission, onward)   None       Objective:   Vitals:   02/02/17 2248 02/03/17 0510 02/03/17 0638 02/03/17 1300  BP: 123/72 (!) 165/140 138/66 112/69  Pulse:  87 66 69  Resp:  18  16  Temp:  98.5 F (36.9 C)  98.1 F (36.7 C)  TempSrc:  Oral  Oral  SpO2:  97%  98%  Weight:      Height:        Wt Readings from Last 3 Encounters:  02/01/17 102.6 kg (226 lb 3.1 oz)  01/25/17 104.8 kg (231 lb)  01/22/17 103.9 kg (229 lb 1.6 oz)     Intake/Output Summary (Last 24 hours) at 02/03/2017 1624 Last data  filed at 02/02/2017 1900 Gross per 24 hour  Intake 360 ml  Output -  Net 360 ml    Physical Exam  Gen:- Awake Alert,  In no apparent distress  HEENT:- [place].AT, No sclera icterus Neck-Supple Neck,No JVD,.  Lungs-  CTAB  CV- S1, S2 normal, irregularly irregular Abd-  +ve B.Sounds, Abd Soft, No tenderness,    Extremity/Skin:- No  edema,    Psych-affect is appropriate Neuro-Generalized weakness without new focal deficits    Data Review:   Micro Results No results found for this or any previous visit (from the past 240 hour(s)).  Radiology Reports Dg Chest 2 View  Result Date: 02/01/2017 CLINICAL DATA:  Chest pain and shortness of breath EXAM: CHEST  2 VIEW COMPARISON:  January 21, 2017 FINDINGS: There is interstitial pulmonary edema. There is no airspace consolidation. There is pulmonary venous hypertension with cardiomegaly. There is no evident adenopathy. There is degenerative change in each shoulder. There is superior migration of the right humeral head. IMPRESSION: Interstitial edema with cardiomegaly and pulmonary venous hypertension. There is likely a degree of congestive heart failure. No consolidation. No adenopathy. Degenerative change in each shoulder with probable chronic rotator cuff tear on the right. Electronically Signed   By: Lowella Grip III M.D.   On: 02/01/2017 13:40   Dg Chest 2 View  Result Date: 01/21/2017 CLINICAL DATA:  Shortness of breath and generalized weakness associated with shortness of breath. History of atrial flutter, hypertension, coronary artery disease. EXAM: CHEST  2 VIEW COMPARISON:  Chest x-ray of January 02, 2017 FINDINGS: The lungs are adequately inflated. The interstitial markings are increased diffusely. The pulmonary vascularity is engorged. The cardiac silhouette remains enlarged. No alveolar infiltrates or edema is observed. There is no pleural effusion. There are degenerative changes of both shoulders. IMPRESSION: CHF with interstitial  edema. No discrete pneumonia. The appearance of the chest has worsened since the previous study. Electronically Signed   By: David  Martinique M.D.   On: 01/21/2017 10:07    CBC Recent Labs  Lab 01/30/17 1416 02/01/17 1212 02/02/17 0656  WBC 8.1 6.5 6.9  HGB 11.6* 11.0* 11.3*  HCT 36.8 35.1* 36.5  PLT 365 328 345  MCV 84.6 85.2 85.1  MCH 26.7 26.7 26.3  MCHC 31.5 31.3 31.0  RDW 14.9 15.0 15.2  LYMPHSABS 2.1 1.9  --   MONOABS 0.8 0.6  --   EOSABS 0.2 0.2  --   BASOSABS 0.0 0.0  --     Chemistries  Recent Labs  Lab 01/30/17 1416 02/01/17 1212 02/02/17 0656  NA 137 139 138  K 3.9 3.9 3.9  CL 102 104 102  CO2 24  24 24  GLUCOSE 100* 99 94  BUN 18 18 21*  CREATININE 0.80 0.65 0.74  CALCIUM 9.2 9.1 9.2  AST  --  18  --   ALT  --  12*  --   ALKPHOS  --  51  --   BILITOT  --  1.2  --    ------------------------------------------------------------------------------------------------------------------ No results for input(s): CHOL, HDL, LDLCALC, TRIG, CHOLHDL, LDLDIRECT in the last 72 hours.  No results found for: HGBA1C ------------------------------------------------------------------------------------------------------------------ Recent Labs    02/01/17 1212  TSH 1.783   ------------------------------------------------------------------------------------------------------------------ No results for input(s): VITAMINB12, FOLATE, FERRITIN, TIBC, IRON, RETICCTPCT in the last 72 hours.  Coagulation profile No results for input(s): INR, PROTIME in the last 168 hours.  No results for input(s): DDIMER in the last 72 hours.  Cardiac Enzymes Recent Labs  Lab 02/01/17 1855 02/02/17 0059 02/02/17 0656  TROPONINI <0.03 <0.03 <0.03   ------------------------------------------------------------------------------------------------------------------    Component Value Date/Time   BNP 253.0 (H) 02/01/2017 1212    Roxan Hockey M.D on 02/03/2017 at 4:24 PM  Between 7am  to 7pm - Pager - [phone]  After 7pm go to [email or web address] - password [email or web address]  [facility] -  Office  [phone]  Voice Recognition Viviann Spare dictation system was used to create this note, attempts have been made to correct errors. Please contact the author with questions and/or clarifications.

## 2017-02-04 ENCOUNTER — Encounter (HOSPITAL_COMMUNITY)
Admission: RE | Disposition: A | Discharge status: Home Health | Payer: Self-pay | Source: Ambulatory Visit | Attending: Family Medicine

## 2017-02-04 ENCOUNTER — Other Ambulatory Visit: Payer: Self-pay

## 2017-02-04 ENCOUNTER — Inpatient Hospital Stay (HOSPITAL_COMMUNITY): Payer: Medicare Other | Admitting: Anesthesiology

## 2017-02-04 ENCOUNTER — Inpatient Hospital Stay (HOSPITAL_COMMUNITY): Payer: Medicare Other

## 2017-02-04 ENCOUNTER — Encounter (HOSPITAL_COMMUNITY): Payer: Self-pay | Admitting: *Deleted

## 2017-02-04 DIAGNOSIS — I4891 Unspecified atrial fibrillation: Secondary | ICD-10-CM

## 2017-02-04 DIAGNOSIS — I10 Essential (primary) hypertension: Secondary | ICD-10-CM

## 2017-02-04 DIAGNOSIS — I340 Nonrheumatic mitral (valve) insufficiency: Secondary | ICD-10-CM

## 2017-02-04 HISTORY — PX: CARDIOVERSION: SHX1299

## 2017-02-04 HISTORY — PX: TEE WITHOUT CARDIOVERSION: SHX5443

## 2017-02-04 LAB — BASIC METABOLIC PANEL
Anion gap: 11 (ref 5–15)
BUN: 25 mg/dL — ABNORMAL HIGH (ref 6–20)
CO2: 24 mmol/L (ref 22–32)
CREATININE: 0.74 mg/dL (ref 0.44–1.00)
Calcium: 9.2 mg/dL (ref 8.9–10.3)
Chloride: 102 mmol/L (ref 101–111)
GFR calc Af Amer: >60 mL/min (ref >60)
GFR calc non Af Amer: >60 mL/min (ref >60)
GLUCOSE: 99 mg/dL (ref 65–99)
Potassium: 3.9 mmol/L (ref 3.5–5.1)
Sodium: 137 mmol/L (ref 135–145)

## 2017-02-04 LAB — CBC
HCT: 36.9 % (ref 36.0–46.0)
Hemoglobin: 11.7 g/dL — ABNORMAL LOW (ref 12.0–15.0)
MCH: 26.4 pg (ref 26.0–34.0)
MCHC: 31.7 g/dL (ref 30.0–36.0)
MCV: 83.3 fL (ref 78.0–100.0)
PLATELETS: 355 10*3/uL (ref 150–400)
RBC: 4.43 MIL/uL (ref 3.87–5.11)
RDW: 15.1 % (ref 11.5–15.5)
WBC: 8.8 10*3/uL (ref 4.0–10.5)

## 2017-02-04 LAB — MAGNESIUM: Magnesium: 2.1 mg/dL (ref 1.7–2.4)

## 2017-02-04 SURGERY — CARDIOVERSION
Anesthesia: Monitor Anesthesia Care

## 2017-02-04 MED ORDER — SODIUM CHLORIDE 0.9% FLUSH: 3.0000 mL | INTRAVENOUS | Status: DC | PRN

## 2017-02-04 MED ORDER — SODIUM CHLORIDE 0.9% FLUSH: 3.0000 mL | Freq: Two times a day (BID) | INTRAVENOUS | Status: DC

## 2017-02-04 MED ORDER — ONDANSETRON 4 MG PO TBDP
4.0000 mg | ORAL_TABLET | Freq: Once | ORAL | Status: AC
  Administered 2017-02-04: 4 mg via ORAL

## 2017-02-04 MED ORDER — SODIUM CHLORIDE 0.9 % IV SOLN: INTRAVENOUS | Status: DC

## 2017-02-04 MED ORDER — ONDANSETRON 4 MG PO TBDP
ORAL_TABLET | ORAL | Status: AC
  Filled 2017-02-04: qty 1

## 2017-02-04 MED ORDER — BUTAMBEN-TETRACAINE-BENZOCAINE 2-2-14 % EX AERO
INHALATION_SPRAY | CUTANEOUS | Status: AC
  Filled 2017-02-04: qty 5

## 2017-02-04 MED ORDER — LACTATED RINGERS IV SOLN
INTRAVENOUS | Status: DC
  Administered 2017-02-04: 14:00:00 via INTRAVENOUS

## 2017-02-04 MED ORDER — SODIUM CHLORIDE 0.9 % IV SOLN: 250.0000 mL | INTRAVENOUS | Status: DC

## 2017-02-04 MED ORDER — PROPOFOL 500 MG/50ML IV EMUL
INTRAVENOUS | Status: DC | PRN
  Administered 2017-02-04: 150 ug/kg/min via INTRAVENOUS

## 2017-02-04 MED ORDER — PHENYLEPHRINE HCL 10 MG/ML IJ SOLN
INTRAMUSCULAR | Status: DC | PRN
  Administered 2017-02-04 (×2): 40 ug via INTRAVENOUS

## 2017-02-04 NOTE — Progress Notes (Signed)
Progress Note  Patient Name: [name] Date of Encounter: 02/04/2017  Primary Cardiologist: Domenic Polite  Subjective   Mild SOB this AM  Inpatient Medications    Scheduled Meds: . aspirin EC  81 mg Oral Daily  . atorvastatin  20 mg Oral QHS  . balsalazide  2,250 mg Oral TID  . furosemide  40 mg Oral Daily  . losartan  25 mg Oral Daily  . metoprolol succinate  50 mg Oral Daily  . multivitamin-lutein  1 capsule Oral q1800  . potassium chloride SA  20 mEq Oral Daily  . rivaroxaban  20 mg Oral Q supper  . senna  1 tablet Oral BID  . sodium chloride flush  3 mL Intravenous Q12H   Continuous Infusions: . sodium chloride     PRN Meds: sodium chloride, acetaminophen **OR** acetaminophen, ketotifen, nitroGLYCERIN, ondansetron **OR** ondansetron (ZOFRAN) IV, polyethylene glycol, sodium chloride flush, traZODone   Vital Signs    Vitals:   02/03/17 0638 02/03/17 1300 02/03/17 2108 02/04/17 0607  BP: 138/66 112/69 117/69 108/70  Pulse: 66 69 96 (!) 58  Resp:  16 16 16   Temp:  98.1 F (36.7 C) 98 F (36.7 C) 97.8 F (36.6 C)  TempSrc:  Oral Oral Oral  SpO2:  98% 98% 94%  Weight:      Height:       No intake or output data in the 24 hours ending 02/04/17 0847 Filed Weights   02/01/17 0954 02/01/17 1858  Weight: 231 lb (104.8 kg) 226 lb 3.1 oz (102.6 kg)    Telemetry    afib elevated rates - Personally Reviewed  ECG     Physical Exam   GEN: No acute distress.   Neck: No JVD Cardiac: RRR, no murmurs, rubs, or gallops.  Respiratory: Clear to auscultation bilaterally. GI: Soft, nontender, non-distended  MS: No edema; No deformity. Neuro:  Nonfocal  Psych: Normal affect   Labs    Chemistry Recent Labs  Lab 02/01/17 1212 02/02/17 0656 02/04/17 0538  NA 139 138 137  K 3.9 3.9 3.9  CL 104 102 102  CO2 24 24 24   GLUCOSE 99 94 99  BUN 18 21* 25*  CREATININE 0.65 0.74 0.74  CALCIUM 9.1 9.2 9.2  PROT 6.5  --   --   ALBUMIN 3.5  --   --   AST 18   --   --   ALT 12*  --   --   ALKPHOS 51  --   --   BILITOT 1.2  --   --   GFRNONAA >60 >60 >60  GFRAA >60 >60 >60  ANIONGAP 11 12 11      Hematology Recent Labs  Lab 02/01/17 1212 02/02/17 0656 02/04/17 0538  WBC 6.5 6.9 8.8  RBC 4.12 4.29 4.43  HGB 11.0* 11.3* 11.7*  HCT 35.1* 36.5 36.9  MCV 85.2 85.1 83.3  MCH 26.7 26.3 26.4  MCHC 31.3 31.0 31.7  RDW 15.0 15.2 15.1  PLT 328 345 355    Cardiac Enzymes Recent Labs  Lab 02/01/17 1212 02/01/17 1855 02/02/17 0059 02/02/17 0656  TROPONINI <0.03 <0.03 <0.03 <0.03   No results for input(s): TROPIPOC in the last 168 hours.   BNP Recent Labs  Lab 02/01/17 1212  BNP 253.0*     DDimer No results for input(s): DDIMER in the last 168 hours.   Radiology    No results found.  Cardiac Studies    Patient Profile  [name] is a 84 y.o. female with a hx of chronic systolic HF and persistent afib who is being seen today for the evaluation of SOBat the request of Dr Reather Converse    Assessment & Plan     1. Acute on Chronic systolic HF - 02/4095 echo LVEF 30-35% - fairly new diagnosis of systolic dysfunction last month. Thought possible tachycardia mediated as afib was diagnosed around the same time. The patient does also have a history of CAD  No I/Os data. Currently on oral diuretics.  - medical therapy with losartan 25, toprol 50 - I think most of her ongoing SOB is related to afib. Difficult fluid assessment by exam due to body habitus. CXR did have some edema, she received IV lasix x 1 on admission. Follow breathing s/p DCCV, if ongong SOB could consider uptitrating her oral lasix.   2. Peristent afib - remains symptomsatic, plans for DCCV today however cancelled as patient had missed a dose of anticoagulation on Jan 15 - plan for TEE/DCCV today  3. CAD - cycle enzymes, EKG with chronic conduction delay - pending LVEF and symptoms response to better controlled afib over time, may warrant an ischemic  evaluation  - nonspecific chest tightness at times, follow symptoms with diuresis and better control of her afib.   - trops neg this admit. Ongoing sob appears likely due to her afib.    For questions or updates, please contact [place] Please consult [email or web address] for contact info under Cardiology/STEMI.      Merrily Pew, MD  02/04/2017, 8:47 AM

## 2017-02-04 NOTE — CV Procedure (Addendum)
CV Procedure note  Procedure: TEE/DCCV Physician: Dr Carlyle Dolly Indication: persistent afib   Patient was brought to the procedure suite after appropriate consent was obtained. The posterior oropharynx was anesthesized with 2% viscous lidocaine. Sedation was achieved with the assistance of anesthestiology, for full details please see there note. Patient was placed in left lateral decubitus position, bite block placed. The TEE probe was intubated into the esophagus without difficultly and several views were taken. There was no evidence of intracardiac thrombus. The TEE probe was removed, and patient defib pads placed anterior and posterior positions. She was successfullyl cardioverated with a 200j synchronized shock x 2 to NSR. Cardiopulmonary monitoring was performed throughout the procedure. Low bp's after cardioversion SBP in high 60s, she received 28mg of neosynephrine IV x 2   JCarlyle DollyMD

## 2017-02-04 NOTE — Anesthesia Preprocedure Evaluation (Signed)
Anesthesia Evaluation  Patient identified by MRN, date of birth, ID band Patient awake    Airway Mallampati: I  TM Distance: >3 FB Neck ROM: Full    Dental  (+) Teeth Intact   Pulmonary    Pulmonary exam normal breath sounds clear to auscultation       Cardiovascular Exercise Tolerance: Poor hypertension, Pt. on home beta blockers and Pt. on medications + CAD (stent) and +CHF  + dysrhythmias (asymptomatic, no CP or SOB) Atrial Fibrillation  Rhythm:Irregular Rate:Normal  A-fib, PVC's, LBBB   Neuro/Psych Depression    GI/Hepatic PUD,   Endo/Other    Renal/GU      Musculoskeletal  (+) Arthritis ,   Abdominal   Peds  Hematology   Anesthesia Other Findings   Reproductive/Obstetrics                             Anesthesia Physical Anesthesia Plan  ASA: III  Anesthesia Plan: MAC   Post-op Pain Management:    Induction:   PONV Risk Score and Plan:   Airway Management Planned: Simple Face Mask  Additional Equipment:   Intra-op Plan:   Post-operative Plan:   Informed Consent: I have reviewed the patients History and Physical, chart, labs and discussed the procedure including the risks, benefits and alternatives for the proposed anesthesia with the patient or authorized representative who has indicated his/her understanding and acceptance.   Dental advisory given  Plan Discussed with: CRNA  Anesthesia Plan Comments:         Anesthesia Quick Evaluation

## 2017-02-04 NOTE — Anesthesia Postprocedure Evaluation (Signed)
Anesthesia Post Note  Patient: [name]  Procedure(s) Performed: CARDIOVERSION (N/A ) TRANSESOPHAGEAL ECHOCARDIOGRAM (TEE) WITH PROPOL (N/A )  Patient location during evaluation: PACU Anesthesia Type: MAC Level of consciousness: awake and alert, oriented and patient cooperative Pain management: pain level controlled Vital Signs Assessment: post-procedure vital signs reviewed and stable Respiratory status: spontaneous breathing and respiratory function stable Cardiovascular status: stable Postop Assessment: no apparent nausea or vomiting Anesthetic complications: no     Last Vitals:  Vitals:   02/04/17 1335 02/04/17 1506  BP: (!) 123/92   Pulse: (!) 115   Resp: 20   Temp: 36.8 C (P) 36.9 C  SpO2: 96%     Last Pain:  Vitals:   02/04/17 1335  TempSrc: Oral  PainSc:                  Deeya Richeson A

## 2017-02-04 NOTE — Transfer of Care (Signed)
Immediate Anesthesia Transfer of Care Note  Patient: [name]  Procedure(s) Performed: CARDIOVERSION (N/A ) TRANSESOPHAGEAL ECHOCARDIOGRAM (TEE) WITH PROPOL (N/A )  Patient Location: PACU  Anesthesia Type:MAC  Level of Consciousness: awake, alert , oriented and patient cooperative  Airway & Oxygen Therapy: Patient Spontanous Breathing  Post-op Assessment: Report given to RN and Post -op Vital signs reviewed and stable  Post vital signs: Reviewed and stable  Last Vitals:  Vitals:   02/04/17 1335 02/04/17 1506  BP: (!) 123/92   Pulse: (!) 115   Resp: 20   Temp: 36.8 C (P) 36.9 C  SpO2: 96%     Last Pain:  Vitals:   02/04/17 1335  TempSrc: Oral  PainSc:       Patients Stated Pain Goal: 6 (69/67/89 3810)  Complications: No apparent anesthesia complications

## 2017-02-04 NOTE — Progress Notes (Signed)
*  PRELIMINARY RESULTS* Echocardiogram Echocardiogram Transesophageal has been performed.  Samuel Germany 02/04/2017, 3:55 PM

## 2017-02-04 NOTE — Progress Notes (Signed)
PROGRESS NOTE                                                                                                                                                                                                             Patient Demographics:    [name], is a 84 y.o. female, DOB - Jun 02, 1930, [id number]  Admit date - 02/01/2017   Admitting Physician Courage Denton Brick, MD  Outpatient Primary MD for the patient is Rory Percy, MD  LOS - 3  Outpatient Specialists:   Chief Complaint  Patient presents with  . Atrial Fibrillation       Brief Narrative  84 year old female with history of A. fib/A flutter, CAD, systolic CHF presented for outpatient cardioversion but was found that she missed her dose of Xarelto on 1/15 so cardiologist recommended TEE and DC cardioversion on 1/28 as she had not been on 3 weeks of uninterrupted anticoagulation. Prior to being discharged home from the office she complained of chest pain and was admitted for further management and inpatient DC cardioversion.     Subjective:   denies any chest pain symptoms and is anxious to have her procedure done today.   Assessment  & Plan :    Principal Problem:    Atrial fibrillation with RVR ([place]) TEE with DC cardioversion done successfully today (cardioverted with 200 J synchronized shock 2 normal sinus rhythm). Monitor on telemetry overnight and likely discharge home in a.m. If stable. Continue Toprol and Xarelto.  Coronary artery disease No for the chest and symptoms. Serial troponins negative. Continue aspirin, beta blocker and statin. Prior cardiac cath with BMS to RCA in 2014.  Chronic systolic CHF Recent echo from 12/2016 with EF of 30-35%, worsened from prior echo. She is euvolemic. Continue beta blocker and losartan.        Code Status : Full code  Family Communication  : Sister at bedside  Disposition Plan  : Home in a.m.  if stable overnight on telemetry  Barriers For Discharge : Improving symptoms  Consults  :  Cardiology  Procedures  : TEE with cardioversion  DVT Prophylaxis  :  Xarelto  Lab Results  Component Value Date   PLT 355 02/04/2017    Antibiotics  :    Anti-infectives (From admission, onward)   None        Objective:  Vitals:   02/04/17 1520 02/04/17 1530 02/04/17 1545 02/04/17 1600  BP:  (!) 87/58 (!) 97/59 (!) 93/57  Pulse:  71 67 71  Resp:  20 18 (!) 21  Temp:      TempSrc:      SpO2: 92% 94% 99% 100%  Weight:      Height:        Wt Readings from Last 3 Encounters:  02/04/17 102.5 kg (226 lb)  01/25/17 104.8 kg (231 lb)  01/22/17 103.9 kg (229 lb 1.6 oz)     Intake/Output Summary (Last 24 hours) at 02/04/2017 1708 Last data filed at 02/04/2017 1450 Gross per 24 hour  Intake 300 ml  Output -  Net 300 ml     Physical Exam  Gen: not in distress HEENT:  moist mucosa, supple neck Chest: clear b/l, no added sounds CVS: Son and S2 irregular, no murmurs GI: soft, NT, ND, BS+ Musculoskeletal: warm, no edema     Data Review:    CBC Recent Labs  Lab 01/30/17 1416 02/01/17 1212 02/02/17 0656 02/04/17 0538  WBC 8.1 6.5 6.9 8.8  HGB 11.6* 11.0* 11.3* 11.7*  HCT 36.8 35.1* 36.5 36.9  PLT 365 328 345 355  MCV 84.6 85.2 85.1 83.3  MCH 26.7 26.7 26.3 26.4  MCHC 31.5 31.3 31.0 31.7  RDW 14.9 15.0 15.2 15.1  LYMPHSABS 2.1 1.9  --   --   MONOABS 0.8 0.6  --   --   EOSABS 0.2 0.2  --   --   BASOSABS 0.0 0.0  --   --     Chemistries  Recent Labs  Lab 01/30/17 1416 02/01/17 1212 02/02/17 0656 02/04/17 0538  NA 137 139 138 137  K 3.9 3.9 3.9 3.9  CL 102 104 102 102  CO2 24 24 24 24   GLUCOSE 100* 99 94 99  BUN 18 18 21* 25*  CREATININE 0.80 0.65 0.74 0.74  CALCIUM 9.2 9.1 9.2 9.2  MG  --   --   --  2.1  AST  --  18  --   --   ALT  --  12*  --   --   ALKPHOS  --  51  --   --   BILITOT  --  1.2  --   --     ------------------------------------------------------------------------------------------------------------------ No results for input(s): CHOL, HDL, LDLCALC, TRIG, CHOLHDL, LDLDIRECT in the last 72 hours.  No results found for: HGBA1C ------------------------------------------------------------------------------------------------------------------ No results for input(s): TSH, T4TOTAL, T3FREE, THYROIDAB in the last 72 hours.  Invalid input(s): FREET3 ------------------------------------------------------------------------------------------------------------------ No results for input(s): VITAMINB12, FOLATE, FERRITIN, TIBC, IRON, RETICCTPCT in the last 72 hours.  Coagulation profile No results for input(s): INR, PROTIME in the last 168 hours.  No results for input(s): DDIMER in the last 72 hours.  Cardiac Enzymes Recent Labs  Lab 02/01/17 1855 02/02/17 0059 02/02/17 0656  TROPONINI <0.03 <0.03 <0.03   ------------------------------------------------------------------------------------------------------------------    Component Value Date/Time   BNP 253.0 (H) 02/01/2017 1212    Inpatient Medications  Scheduled Meds: . aspirin EC  81 mg Oral Daily  . atorvastatin  20 mg Oral QHS  . balsalazide  2,250 mg Oral TID  . furosemide  40 mg Oral Daily  . losartan  25 mg Oral Daily  . metoprolol succinate  50 mg Oral Daily  . multivitamin-lutein  1 capsule Oral q1800  . potassium chloride SA  20 mEq Oral Daily  . rivaroxaban  20 mg Oral Q  supper  . senna  1 tablet Oral BID  . sodium chloride flush  3 mL Intravenous Q12H  . sodium chloride flush  3 mL Intravenous Q12H   Continuous Infusions: . sodium chloride    . sodium chloride     PRN Meds:.sodium chloride, acetaminophen **OR** acetaminophen, ketotifen, nitroGLYCERIN, ondansetron **OR** ondansetron (ZOFRAN) IV, polyethylene glycol, sodium chloride flush, sodium chloride flush, traZODone  Micro Results No results found  for this or any previous visit (from the past 240 hour(s)).  Radiology Reports Dg Chest 2 View  Result Date: 02/01/2017 CLINICAL DATA:  Chest pain and shortness of breath EXAM: CHEST  2 VIEW COMPARISON:  January 21, 2017 FINDINGS: There is interstitial pulmonary edema. There is no airspace consolidation. There is pulmonary venous hypertension with cardiomegaly. There is no evident adenopathy. There is degenerative change in each shoulder. There is superior migration of the right humeral head. IMPRESSION: Interstitial edema with cardiomegaly and pulmonary venous hypertension. There is likely a degree of congestive heart failure. No consolidation. No adenopathy. Degenerative change in each shoulder with probable chronic rotator cuff tear on the right. Electronically Signed   By: Lowella Grip III M.D.   On: 02/01/2017 13:40   Dg Chest 2 View  Result Date: 01/21/2017 CLINICAL DATA:  Shortness of breath and generalized weakness associated with shortness of breath. History of atrial flutter, hypertension, coronary artery disease. EXAM: CHEST  2 VIEW COMPARISON:  Chest x-ray of January 02, 2017 FINDINGS: The lungs are adequately inflated. The interstitial markings are increased diffusely. The pulmonary vascularity is engorged. The cardiac silhouette remains enlarged. No alveolar infiltrates or edema is observed. There is no pleural effusion. There are degenerative changes of both shoulders. IMPRESSION: CHF with interstitial edema. No discrete pneumonia. The appearance of the chest has worsened since the previous study. Electronically Signed   By: David  Martinique M.D.   On: 01/21/2017 10:07    Time Spent in minutes  25   Livio Ledwith M.D on 02/04/2017 at 5:08 PM  Between 7am to 7pm - Pager - [phone]  After 7pm go to [email or web address] - password [facility]  [facility] -  Office  [phone]

## 2017-02-05 DIAGNOSIS — I2510 Atherosclerotic heart disease of native coronary artery without angina pectoris: Secondary | ICD-10-CM

## 2017-02-05 DIAGNOSIS — I429 Cardiomyopathy, unspecified: Secondary | ICD-10-CM

## 2017-02-05 DIAGNOSIS — R0600 Dyspnea, unspecified: Secondary | ICD-10-CM

## 2017-02-05 DIAGNOSIS — I481 Persistent atrial fibrillation: Principal | ICD-10-CM

## 2017-02-05 DIAGNOSIS — R0789 Other chest pain: Secondary | ICD-10-CM

## 2017-02-05 MED ORDER — METOPROLOL SUCCINATE ER 50 MG PO TB24: 50.0000 mg | ORAL_TABLET | Freq: Every day | ORAL | 0 refills | Status: DC

## 2017-02-05 NOTE — Progress Notes (Signed)
Progress Note  Patient Name: [name] Date of Encounter: 02/05/2017  Primary Cardiologist: Dr. Satira Sark  Subjective   Feels better.  Less short of breath.  No palpitations or chest pain.  Inpatient Medications    Scheduled Meds: . aspirin EC  81 mg Oral Daily  . atorvastatin  20 mg Oral QHS  . balsalazide  2,250 mg Oral TID  . furosemide  40 mg Oral Daily  . losartan  25 mg Oral Daily  . metoprolol succinate  50 mg Oral Daily  . multivitamin-lutein  1 capsule Oral q1800  . potassium chloride SA  20 mEq Oral Daily  . rivaroxaban  20 mg Oral Q supper  . senna  1 tablet Oral BID  . sodium chloride flush  3 mL Intravenous Q12H  . sodium chloride flush  3 mL Intravenous Q12H   Continuous Infusions: . sodium chloride    . sodium chloride     PRN Meds: sodium chloride, acetaminophen **OR** acetaminophen, ketotifen, nitroGLYCERIN, ondansetron **OR** ondansetron (ZOFRAN) IV, polyethylene glycol, sodium chloride flush, sodium chloride flush, traZODone   Vital Signs    Vitals:   02/04/17 1600 02/04/17 2118 02/04/17 2148 02/05/17 0547  BP: (!) 93/57  (!) 112/56 (!) 111/57  Pulse: 71  68 74  Resp: (!) 21  18 18   Temp:   98.7 F (37.1 C) 98.2 F (36.8 C)  TempSrc:   Oral Oral  SpO2: 100% 98% 95% 94%  Weight:      Height:        Intake/Output Summary (Last 24 hours) at 02/05/2017 0915 Last data filed at 02/05/2017 0208 Gross per 24 hour  Intake 300 ml  Output 250 ml  Net 50 ml   Filed Weights   02/01/17 0954 02/01/17 1858 02/04/17 1335  Weight: 231 lb (104.8 kg) 226 lb 3.1 oz (102.6 kg) 226 lb (102.5 kg)    Telemetry    Currently sinus rhythm.  Personally reviewed.  Physical Exam   GEN:  Elderly woman.  No acute distress.   Neck: No JVD. Cardiac: RRR, soft systolic murmur, no gallop.  Respiratory: Nonlabored. Clear to auscultation bilaterally. GI: Soft, nontender, bowel sounds present. MS:  Trace ankle edema; No deformity. Neuro:   Nonfocal. Psych: Alert and oriented x 3. Normal affect.  Labs    Chemistry Recent Labs  Lab 02/01/17 1212 02/02/17 0656 02/04/17 0538  NA 139 138 137  K 3.9 3.9 3.9  CL 104 102 102  CO2 24 24 24   GLUCOSE 99 94 99  BUN 18 21* 25*  CREATININE 0.65 0.74 0.74  CALCIUM 9.1 9.2 9.2  PROT 6.5  --   --   ALBUMIN 3.5  --   --   AST 18  --   --   ALT 12*  --   --   ALKPHOS 51  --   --   BILITOT 1.2  --   --   GFRNONAA >60 >60 >60  GFRAA >60 >60 >60  ANIONGAP 11 12 11      Hematology Recent Labs  Lab 02/01/17 1212 02/02/17 0656 02/04/17 0538  WBC 6.5 6.9 8.8  RBC 4.12 4.29 4.43  HGB 11.0* 11.3* 11.7*  HCT 35.1* 36.5 36.9  MCV 85.2 85.1 83.3  MCH 26.7 26.3 26.4  MCHC 31.3 31.0 31.7  RDW 15.0 15.2 15.1  PLT 328 345 355    Cardiac Enzymes Recent Labs  Lab 02/01/17 1212 02/01/17 1855 02/02/17 0059 02/02/17 0656  TROPONINI <  0.03 <0.03 <0.03 <0.03   No results for input(s): TROPIPOC in the last 168 hours.   BNP Recent Labs  Lab 02/01/17 1212  BNP 253.0*     Radiology    No results found.  Cardiac Studies   TEE 02/04/2017: Study Conclusions  - Left ventricle: The cavity size was normal. Systolic function was   normal. The estimated ejection fraction was in the range of 60%   to 65%. No evidence of thrombus. - Mitral valve: There was mild regurgitation. - Left atrium: The atrium was moderately dilated. No evidence of   thrombus in the atrial cavity or appendage. Normal LAA emptying   velocity of 68 cm/s. - Right atrium: No evidence of thrombus in the atrial cavity or   appendage.  Impressions:  - Successful cardioversion. No cardiac source of emboli was   indentified.  Recommendations:  No evidence of intracardiac thrombus. Proceed with cardioversion.  Patient Profile     84 y.o. 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  She is now status post successful TEE guided cardioversion.  Assessment & Plan    1.  Persistent atrial fibrillation, now in sinus rhythm status post successful TEE guided cardioversion on January 28.  She continues on Xarelto and Toprol-XL.  2.  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  TEE described LVEF in the range of 60-65%.  3.  CAD status post BMS to the RCA in 2014.  No active angina symptoms.  Troponin I levels negative for ACS.  Anticipate discharge home today.  We will arrange a follow-up visit within the next few weeks for clinical reassessment.  May need to consider amiodarone if she manifests breakthrough atrial fibrillation in the short-term.  We will continue current dose of Toprol-XL as well as Xarelto, stay off Cardizem CD for now.  Signed, Rozann Lesches, MD  02/05/2017, 9:15 AM

## 2017-02-05 NOTE — Anesthesia Postprocedure Evaluation (Signed)
Anesthesia Post Note  Patient: [name]  Procedure(s) Performed: CARDIOVERSION (N/A ) TRANSESOPHAGEAL ECHOCARDIOGRAM (TEE) WITH PROPOL (N/A )  Patient location during evaluation: Nursing Unit Anesthesia Type: MAC Level of consciousness: awake and alert and patient cooperative Pain management: pain level controlled Vital Signs Assessment: post-procedure vital signs reviewed and stable Respiratory status: spontaneous breathing, nonlabored ventilation and respiratory function stable Cardiovascular status: blood pressure returned to baseline Postop Assessment: no apparent nausea or vomiting and adequate PO intake Anesthetic complications: no     Last Vitals:  Vitals:   02/04/17 2148 02/05/17 0547  BP: (!) 112/56 (!) 111/57  Pulse: 68 74  Resp: 18 18  Temp: 37.1 C 36.8 C  SpO2: 95% 94%    Last Pain:  Vitals:   02/05/17 0943  TempSrc:   PainSc: Asleep                 [place] Lapaglia J

## 2017-02-05 NOTE — Progress Notes (Signed)
Removed IV-clean, dry, intact. Reviewed d/c paperwork with pt. Reviewed home meds. [place] wheeled stable pt and belongings to main entrance where she was picked up by husband.

## 2017-02-05 NOTE — Discharge Summary (Signed)
Physician Discharge Summary  ANDREY HOOBLER KPQ:[id number] DOB: 07-24-1930 DOA: 02/01/2017  PCP: Rory Percy, MD  Admit date: 02/01/2017 Discharge date: 02/05/2017  Admitted From: home Disposition:  home  Recommendations for Outpatient Follow-up:  1. Follow up with PCP in 1-2 weeks 2. Follow-up appointment with cardiology scheduled for 2/14 at 2:30 PM  Home Health:PT Equipment/Devices:none  Discharge Condition: air CODE STATUS: flow code Diet recommendation: Heart Healthy    Discharge Diagnoses:  Principal Problem:   Atrial fibrillation with RVR ([place])  Active Problems:   Essential hypertension, benign   Atrial flutter (HCC)   Chest pain   Chronic systolic CHF  Brief narrative/history of present illness 84 year old female with history of A. fib/A flutter, CAD, systolic CHF presented for outpatient cardioversion but was found that she missed her dose of Xarelto on 1/15 so cardiologist recommended TEE and DC cardioversion on 1/28 as she had not been on 3 weeks of uninterrupted anticoagulation. Prior to being discharged home from the office she complained of chest pain and was admitted for further management and inpatient DC cardioversion.   Hospital course Principal Problem:    Atrial fibrillation with RVR ([place]) TEE with DC cardioversion done successfully on 1/28 (cardioverted with 200 J synchronized shock 2 normal sinus rhythm). Table on telemetry overnight.Cardiology recommends to continue Xarelto and increase dose of Toprol XL (50 mg daily). Discontinue Cardizem. Arranged follow-up in the office in 2 weeks.  Coronary artery disease No further chest pain symptoms is likely due to A. Fib. Serial troponins negative. Continue aspirin, beta blocker and statin. Prior cardiac cath with BMS to RCA in 2014.  Chronic systolic CHF Recent echo from 12/2016 with EF of 30-35%, worsened from prior echo. She is euvolemic. Continue aspirin,beta blocker, statin, Lasix and  losartan.      Family Communication  : none at bedside today  Disposition Plan  : Home   Consults  :  Cardiology  Procedures  : TEE with cardioversion    Discharge Instructions   Allergies as of 02/05/2017      Reactions   Sinus & Allergy [chlorpheniramine-phenylephrine] Shortness Of Breath   Demerol Rash   Penicillins Rash, Other (See Comments)   REACTION: rash, years ago Has patient had a PCN reaction causing immediate rash, facial/tongue/throat swelling, SOB or lightheadedness with hypotension: Yes Has patient had a PCN reaction causing severe rash involving mucus membranes or skin necrosis: No Has patient had a PCN reaction that required hospitalization No Has patient had a PCN reaction occurring within the last 10 years: No If all of the above answers are "NO", then may proceed with Cephalosporin use.      Medication List    STOP taking these medications   diltiazem 120 MG 24 hr capsule Commonly known as:  CARDIZEM CD     TAKE these medications   ASPIRIN ADULT LOW STRENGTH 81 MG EC tablet Generic drug:  aspirin Take 81 mg by mouth daily.   atorvastatin 20 MG tablet Commonly known as:  LIPITOR TAKE ONE TABLET BY MOUTH AT BEDTIME   balsalazide 750 MG capsule Commonly known as:  COLAZAL TAKE THREE CAPSULES BY MOUTH THREE TIMES DAILY (REPLACING DELZICOL) What changed:  See the new instructions.   furosemide 40 MG tablet Commonly known as:  LASIX Take 1 tablet (40 mg total) by mouth daily.   ICAPS AREDS 2 PO Take 2 tablets by mouth daily at 6 PM.   losartan 25 MG tablet Commonly known as:  COZAAR TAKE 1 TABLET  BY MOUTH EVERY DAY   metoprolol succinate 50 MG 24 hr tablet Commonly known as:  TOPROL-XL Take 1 tablet (50 mg total) by mouth daily. Take with or immediately following a meal. Start taking on:  02/06/2017 What changed:    medication strength  how much to take  additional instructions   nitroGLYCERIN 0.4 MG SL tablet Commonly  known as:  NITROSTAT Place 1 tablet (0.4 mg total) under the tongue every 5 (five) minutes as needed for chest pain.   potassium chloride SA 20 MEQ tablet Commonly known as:  K-DUR,KLOR-CON Take 1 tablet (20 mEq total) by mouth daily.   THERATEARS ALLERGY 0.025 % ophthalmic solution Generic drug:  ketotifen Place 2 drops into both eyes 3 (three) times daily as needed (for dry eyes).   TYLENOL ARTHRITIS PAIN 650 MG CR tablet Generic drug:  acetaminophen Take 1,300 mg by mouth every 8 (eight) hours.   XARELTO 15 MG Tabs tablet Generic drug:  Rivaroxaban TAKE ONE TABLET BY MOUTH DAILY      Follow-up Information    Erma Heritage, PA-C Follow up on 02/21/2017.   Specialties:  Physician Assistant, Cardiology Why:  at 2:30 pm Contact information: [facility] [address] [phone]        Rory Percy, MD. Schedule an appointment as soon as possible for a visit in 1 week(s).   Specialty:  Family Medicine Contact information: [address] [place] [address] [phone]          Allergies  Allergen Reactions  . Sinus & Allergy [Chlorpheniramine-Phenylephrine] Shortness Of Breath  . Demerol Rash  . Penicillins Rash and Other (See Comments)    REACTION: rash, years ago Has patient had a PCN reaction causing immediate rash, facial/tongue/throat swelling, SOB or lightheadedness with hypotension: Yes Has patient had a PCN reaction causing severe rash involving mucus membranes or skin necrosis: No Has patient had a PCN reaction that required hospitalization No Has patient had a PCN reaction occurring within the last 10 years: No If all of the above answers are "NO", then may proceed with Cephalosporin use.       Procedures/Studies: Dg Chest 2 View  Result Date: 02/01/2017 CLINICAL DATA:  Chest pain and shortness of breath EXAM: CHEST  2 VIEW COMPARISON:  January 21, 2017 FINDINGS: There is interstitial pulmonary edema. There is no airspace  consolidation. There is pulmonary venous hypertension with cardiomegaly. There is no evident adenopathy. There is degenerative change in each shoulder. There is superior migration of the right humeral head. IMPRESSION: Interstitial edema with cardiomegaly and pulmonary venous hypertension. There is likely a degree of congestive heart failure. No consolidation. No adenopathy. Degenerative change in each shoulder with probable chronic rotator cuff tear on the right. Electronically Signed   By: Lowella Grip III M.D.   On: 02/01/2017 13:40   Dg Chest 2 View  Result Date: 01/21/2017 CLINICAL DATA:  Shortness of breath and generalized weakness associated with shortness of breath. History of atrial flutter, hypertension, coronary artery disease. EXAM: CHEST  2 VIEW COMPARISON:  Chest x-ray of January 02, 2017 FINDINGS: The lungs are adequately inflated. The interstitial markings are increased diffusely. The pulmonary vascularity is engorged. The cardiac silhouette remains enlarged. No alveolar infiltrates or edema is observed. There is no pleural effusion. There are degenerative changes of both shoulders. IMPRESSION: CHF with interstitial edema. No discrete pneumonia. The appearance of the chest has worsened since the previous study. Electronically Signed   By: Shanon Brow  [place] M.D.   On: 01/21/2017 10:07       Subjective: Stable on telemetry overnight on sinus rhythm. Denies any symptoms.  Discharge Exam: Vitals:   02/04/17 2148 02/05/17 0547  BP: (!) 112/56 (!) 111/57  Pulse: 68 74  Resp: 18 18  Temp: 98.7 F (37.1 C) 98.2 F (36.8 C)  SpO2: 95% 94%   Vitals:   02/04/17 1600 02/04/17 2118 02/04/17 2148 02/05/17 0547  BP: (!) 93/57  (!) 112/56 (!) 111/57  Pulse: 71  68 74  Resp: (!) 21  18 18   Temp:   98.7 F (37.1 C) 98.2 F (36.8 C)  TempSrc:   Oral Oral  SpO2: 100% 98% 95% 94%  Weight:      Height:        Gen: not in distress HEENT:  moist mucosa, supple neck Chest: clear b/l,  no added sounds CVS: S1 and S2 regular, no murmurs GI: soft, NT, ND Musculoskeletal: warm, no edema     The results of significant diagnostics from this hospitalization (including imaging, microbiology, ancillary and laboratory) are listed below for reference.     Microbiology: No results found for this or any previous visit (from the past 240 hour(s)).   Labs: BNP (last 3 results) Recent Labs    01/02/17 1102 02/01/17 1212  BNP 185.0* 814.4*   Basic Metabolic Panel: Recent Labs  Lab 01/30/17 1416 02/01/17 1212 02/02/17 0656 02/04/17 0538  NA 137 139 138 137  K 3.9 3.9 3.9 3.9  CL 102 104 102 102  CO2 24 24 24 24   GLUCOSE 100* 99 94 99  BUN 18 18 21* 25*  CREATININE 0.80 0.65 0.74 0.74  CALCIUM 9.2 9.1 9.2 9.2  MG  --   --   --  2.1   Liver Function Tests: Recent Labs  Lab 02/01/17 1212  AST 18  ALT 12*  ALKPHOS 51  BILITOT 1.2  PROT 6.5  ALBUMIN 3.5   Recent Labs  Lab 02/01/17 1212  LIPASE 30   No results for input(s): AMMONIA in the last 168 hours. CBC: Recent Labs  Lab 01/30/17 1416 02/01/17 1212 02/02/17 0656 02/04/17 0538  WBC 8.1 6.5 6.9 8.8  NEUTROABS 4.8 3.8  --   --   HGB 11.6* 11.0* 11.3* 11.7*  HCT 36.8 35.1* 36.5 36.9  MCV 84.6 85.2 85.1 83.3  PLT 365 328 345 355   Cardiac Enzymes: Recent Labs  Lab 02/01/17 1212 02/01/17 1855 02/02/17 0059 02/02/17 0656  TROPONINI <0.03 <0.03 <0.03 <0.03   BNP: Invalid input(s): POCBNP CBG: No results for input(s): GLUCAP in the last 168 hours. D-Dimer No results for input(s): DDIMER in the last 72 hours. Hgb A1c No results for input(s): HGBA1C in the last 72 hours. Lipid Profile No results for input(s): CHOL, HDL, LDLCALC, TRIG, CHOLHDL, LDLDIRECT in the last 72 hours. Thyroid function studies No results for input(s): TSH, T4TOTAL, T3FREE, THYROIDAB in the last 72 hours.  Invalid input(s): FREET3 Anemia work up No results for input(s): VITAMINB12, FOLATE, FERRITIN, TIBC, IRON,  RETICCTPCT in the last 72 hours. Urinalysis    Component Value Date/Time   COLORURINE STRAW (A) 01/02/2017 1420   APPEARANCEUR CLEAR 01/02/2017 1420   LABSPEC 1.008 01/02/2017 1420   PHURINE 7.0 01/02/2017 1420   GLUCOSEU NEGATIVE 01/02/2017 1420   HGBUR NEGATIVE 01/02/2017 1420   BILIRUBINUR NEGATIVE 01/02/2017 [place] 01/02/2017 1420   PROTEINUR NEGATIVE 01/02/2017 1420   NITRITE NEGATIVE 01/02/2017 1420  LEUKOCYTESUR NEGATIVE 01/02/2017 1420   Sepsis Labs Invalid input(s): PROCALCITONIN,  WBC,  LACTICIDVEN Microbiology No results found for this or any previous visit (from the past 240 hour(s)).   Time coordinating discharge: < 30 minutes  SIGNED:   Louellen Molder, MD  [facility] 02/05/2017, 10:40 AM Pager   If 7PM-7AM, please contact night-coverage [email or web address] Password [email or web address]

## 2017-02-05 NOTE — Care Management Note (Signed)
Case Management Note  Patient Details  Name: [name] MRN: [id number] Date of Birth: 1930/04/13  Subjective/Objective:     Pt admitted with A-fib. Recent admission for same. Pt discharged last time with referral for [facility] PT to [facility]. Pt states they never contacted her. Pt lives with husband, uses a cane with ambulation. Pt is agreeable to [facility] PT eval at DC. She asks about using [facility] as her husband gets his oxygen from them. Pt communicates no further concerns about DC plan.                 Action/Plan: Pt will DC home today with [facility] PT referral to [facility]. Blake Divine, [facility] rep, given pt info and will pull from Epic. CM will f/u with [facility] about not contacting pt.   Expected Discharge Date:  02/05/17               Expected Discharge Plan:  [place]  In-House Referral:  NA  Discharge planning Services  CM Consult  Post Acute Care Choice:  Home Health Choice offered to:  Patient  HH Arranged:  PT [place]:  [place]  Status of Service:  Completed, signed off  Sherald Barge, RN 02/05/2017, 10:56 AM

## 2017-02-05 NOTE — Care Management Important Message (Signed)
Important Message  Patient Details  Name: TITIANNA LOOMIS MRN: [id number] Date of Birth: 12-Nov-1930   Medicare Important Message Given:  Yes    Sherald Barge, RN 02/05/2017, 10:30 AM

## 2017-02-05 NOTE — Addendum Note (Signed)
Addendum  created 02/05/17 1057 by Charmaine Downs, CRNA   Sign clinical note

## 2017-02-06 ENCOUNTER — Telehealth: Payer: Self-pay | Admitting: Cardiology

## 2017-02-06 ENCOUNTER — Other Ambulatory Visit: Payer: Self-pay | Admitting: *Deleted

## 2017-02-06 ENCOUNTER — Encounter (HOSPITAL_COMMUNITY): Payer: Self-pay | Admitting: Cardiology

## 2017-02-06 MED ORDER — METOPROLOL SUCCINATE ER 25 MG PO TB24: 25.0000 mg | ORAL_TABLET | Freq: Every day | ORAL | 0 refills | Status: DC

## 2017-02-06 MED ORDER — NITROGLYCERIN 0.4 MG SL SUBL: 0.4000 mg | SUBLINGUAL_TABLET | SUBLINGUAL | 3 refills | Status: DC | PRN

## 2017-02-06 NOTE — Telephone Encounter (Signed)
Stated that her Toprol XL was increased to 74m daily.  Her meds are bubble packed & just had done for the month.  Will send in extra prescription of the Toprol XL 235mtab so she can take with the 259mab that she already has in her pack to equal the 109m45mFollow up scheduled for 02/21/2017 with [place] [place].  If they decide to keep her at 109mg45m will request new script at that time.  Nitroglycerin refilled as well at patient request.

## 2017-02-06 NOTE — Telephone Encounter (Signed)
Patient called stating that she was discharged from [facility] on 02-06-2017.  She was asking to speak with Nurse in regards to  Her medications.

## 2017-02-07 DIAGNOSIS — I429 Cardiomyopathy, unspecified: Secondary | ICD-10-CM | POA: Diagnosis not present

## 2017-02-07 DIAGNOSIS — Z96653 Presence of artificial knee joint, bilateral: Secondary | ICD-10-CM | POA: Diagnosis not present

## 2017-02-07 DIAGNOSIS — I480 Paroxysmal atrial fibrillation: Secondary | ICD-10-CM | POA: Diagnosis not present

## 2017-02-07 DIAGNOSIS — F329 Major depressive disorder, single episode, unspecified: Secondary | ICD-10-CM | POA: Diagnosis not present

## 2017-02-07 DIAGNOSIS — I110 Hypertensive heart disease with heart failure: Secondary | ICD-10-CM | POA: Diagnosis not present

## 2017-02-07 DIAGNOSIS — M1990 Unspecified osteoarthritis, unspecified site: Secondary | ICD-10-CM | POA: Diagnosis not present

## 2017-02-07 DIAGNOSIS — I5022 Chronic systolic (congestive) heart failure: Secondary | ICD-10-CM | POA: Diagnosis not present

## 2017-02-07 DIAGNOSIS — H3530 Unspecified macular degeneration: Secondary | ICD-10-CM | POA: Diagnosis not present

## 2017-02-07 DIAGNOSIS — I2510 Atherosclerotic heart disease of native coronary artery without angina pectoris: Secondary | ICD-10-CM | POA: Diagnosis not present

## 2017-02-14 ENCOUNTER — Other Ambulatory Visit: Payer: Self-pay | Admitting: Cardiology

## 2017-02-19 NOTE — Progress Notes (Deleted)
Cardiology Office Note    Date:  02/19/2017   ID:  [name], [name] [birth year], MRN [id number]  PCP:  [name] Percy, MD  Cardiologist: No primary care provider on file.    No chief complaint on file.   History of Present Illness:    [name] is a 84 y.o. female with past medical history of CAD (s/p DES to RCA in 05/2012), chronic combined systolic and diastolic CHF (EF 82-64% by echo in 12/2016, improved to 60-65% by TEE in 01/2017), persistent atrial fibrillation (on Xarelto), HTN, and HLD who presents to the office today for hospital follow-up.   She was evaluated by Dr. Domenic [name] on 01/25/2017 and reported worsening fatigue since being diagnosed with atrial fibrillation.  Options were reviewed and it was recommended she proceed with an outpatient cardioversion.  She presented to [facility] on 02/01/2017 for the procedure but reported missing a dose of the medication a week prior.  After the procedure was canceled, she developed worsening shortness of breath and was directed to the emergency department for further evaluation.  he was admitted for further evaluation and plans for a TEE-guided cardioversion the following Monday. She underwent successful DCCV on 02/04/2017 with 200J x2 with return to NSR.TEE showed her EF had improved to 60-65%. She was monitored overnight and continue to maintain normal sinus rhythm. She was continued on Toprol-XL and Xarelto at the time of discharge.   Past Medical History:  Diagnosis Date  . Arthritis   . Atrial flutter ([place])   . Coronary atherosclerosis of native coronary artery    BMS RCA May 2014 - Powell stent  . Depression   . Essential hypertension, benign   . Hyperlipemia   . Macular degeneration   . Paroxysmal atrial fibrillation (HCC)   . Secondary cardiomyopathy (HCC)    LVEF 35% - improved to 45-50%  . Ulcerative colitis     Past Surgical History:  Procedure Laterality Date  . ABDOMINAL HYSTERECTOMY    . APPENDECTOMY     . Bilateral knee replacements      2007, 2008  . BLADDER SURGERY    . CARDIOVERSION N/A 02/04/2017   Procedure: CARDIOVERSION;  Surgeon: Arnoldo Lenis, MD;  [facility];  Service: Endoscopy;  Laterality: N/A;  . COLONOSCOPY N/A 06/15/2015   Procedure: COLONOSCOPY;  Surgeon: Rogene Houston, MD;  Location: [facility];  Service: Endoscopy;  Laterality: N/A;  210  . CYSTOSCOPY W/ RETROGRADES Bilateral 05/14/2016   Procedure: CYSTOSCOPY WITH BILATERAL RETROGRADE PYELOGRAM;  Surgeon: Raynelle Bring, MD;  Location: [facility] OR[facility];  Service: Urology;  Laterality: Bilateral;  GENERAL ANESTHESIA WITH PARALYSIS  . LEFT HEART CATHETERIZATION WITH CORONARY ANGIOGRAM N/A 10/30/2013   Procedure: LEFT HEART CATHETERIZATION WITH CORONARY ANGIOGRAM;  Surgeon: Burnell Blanks, MD;  Location: [facility] CATH LAB;  Service: Cardiovascular;  Laterality: N/A;  . TEE WITHOUT CARDIOVERSION N/A 02/04/2017   Procedure: TRANSESOPHAGEAL ECHOCARDIOGRAM (TEE) WITH PROPOL;  Surgeon: Arnoldo Lenis, MD;  [facility];  Service: Endoscopy;  Laterality: N/A;  . TONSILLECTOMY    . TOTAL KNEE ARTHROPLASTY    . TRANSURETHRAL RESECTION OF BLADDER TUMOR N/A 05/14/2016   Procedure: TRANSURETHRAL RESECTION OF BLADDER TUMOR (TURBT);  Surgeon: Raynelle Bring, MD;  Location: [facility];  Service: Urology;  Laterality: N/A;  GENERAL ANESTHESIA WITH PARALYSIS  . YAG LASER APPLICATION Bilateral 15/08/3092   Procedure: YAG LASER APPLICATION;  Surgeon: Williams Che, MD;  Location: [facility] ORS;  Service: Ophthalmology;  Laterality: Bilateral;    Current Medications: Outpatient Medications Prior to Visit  Medication Sig Dispense Refill  . acetaminophen (TYLENOL ARTHRITIS PAIN) 650 MG CR tablet Take 1,300 mg by mouth every 8 (eight) hours.     . ASPIRIN ADULT LOW STRENGTH 81 MG EC tablet Take 81 mg by mouth daily.  12  . atorvastatin (LIPITOR) 20 MG tablet TAKE ONE TABLET BY MOUTH AT BEDTIME 30 tablet 3  . balsalazide  (COLAZAL) 750 MG capsule TAKE THREE CAPSULES BY MOUTH THREE TIMES DAILY (REPLACING DELZICOL) (Patient taking differently: TAKE THREE CAPSULES (2250 MG) BY MOUTH THREE TIMES DAILY (REPLACING DELZICOL)) 270 capsule 3  . furosemide (LASIX) 40 MG tablet Take 1 tablet (40 mg total) by mouth daily. 30 tablet 6  . ketotifen (THERATEARS ALLERGY) 0.025 % ophthalmic solution Place 2 drops into both eyes 3 (three) times daily as needed (for dry eyes).     Marland Kitchen losartan (COZAAR) 25 MG tablet TAKE ONE TABLET BY MOUTH DAILY 90 tablet 3  . metoprolol succinate (TOPROL XL) 25 MG 24 hr tablet Take 1 tablet (25 mg total) by mouth daily. 30 tablet 0  . metoprolol succinate (TOPROL-XL) 50 MG 24 hr tablet Take 1 tablet (50 mg total) by mouth daily. Take with or immediately following a meal. 30 tablet 0  . Multiple Vitamins-Minerals (ICAPS AREDS 2 PO) Take 2 tablets by mouth daily at 6 PM.     . nitroGLYCERIN (NITROSTAT) 0.4 MG SL tablet Place 1 tablet (0.4 mg total) under the tongue every 5 (five) minutes as needed for chest pain. 25 tablet 3  . potassium chloride SA (K-DUR,KLOR-CON) 20 MEQ tablet Take 1 tablet (20 mEq total) by mouth daily. 30 tablet 6  . XARELTO 15 MG TABS tablet TAKE ONE TABLET BY MOUTH EVERY DAY 30 tablet 3   No facility-administered medications prior to visit.      Allergies:   Sinus & allergy [chlorpheniramine-phenylephrine]; Demerol; and Penicillins   Social History   Socioeconomic History  . Marital status: Married    Spouse name: Not on file  . Number of children: Not on file  . Years of education: Not on file  . Highest education level: Not on file  Social Needs  . Financial resource strain: Not on file  . Food insecurity - worry: Not on file  . Food insecurity - inability: Not on file  . Transportation needs - medical: Not on file  . Transportation needs - non-medical: Not on file  Occupational History  . Not on file  Tobacco Use  . Smoking status: Never Smoker  . Smokeless  tobacco: Never Used  Substance and Sexual Activity  . Alcohol use: No    Alcohol/week: 0.0 oz  . Drug use: No  . Sexual activity: Not Currently  Other Topics Concern  . Not on file  Social History Narrative  . Not on file     Family History:  The patient's ***family history is not on file.   Review of Systems:   Please see the history of present illness.     General:  No chills, fever, night sweats or weight changes.  Cardiovascular:  No chest pain, dyspnea on exertion, edema, orthopnea, palpitations, paroxysmal nocturnal dyspnea. Dermatological: No rash, lesions/masses Respiratory: No cough, dyspnea Urologic: No hematuria, dysuria Abdominal:   No nausea, vomiting, diarrhea, bright red blood per rectum, melena, or hematemesis Neurologic:  No visual changes, wkns, changes in mental status. All other systems reviewed and are otherwise negative except as  noted above.   Physical Exam:    VS:  There were no vitals taken for this visit.   General: Well developed, well nourished,female appearing in no acute distress. Head: Normocephalic, atraumatic, sclera non-icteric, no xanthomas, nares are without discharge.  Neck: No carotid bruits. JVD not elevated.  Lungs: Respirations regular and unlabored, without wheezes or rales.  Heart: ***Regular rate and rhythm. No S3 or S4.  No murmur, no rubs, or gallops appreciated. Abdomen: Soft, non-tender, non-distended with normoactive bowel sounds. No hepatomegaly. No rebound/guarding. No obvious abdominal masses. Msk:  Strength and tone appear normal for age. No joint deformities or effusions. Extremities: No clubbing or cyanosis. No edema.  Distal pedal pulses are 2+ bilaterally. Neuro: Alert and oriented X 3. Moves all extremities spontaneously. No focal deficits noted. Psych:  Responds to questions appropriately with a normal affect. Skin: No rashes or lesions noted  Wt Readings from Last 3 Encounters:  02/04/17 226 lb (102.5 kg)    01/25/17 231 lb (104.8 kg)  01/22/17 229 lb 1.6 oz (103.9 kg)      Studies/Labs Reviewed:   EKG:  EKG is*** ordered today.  The ekg ordered today demonstrates ***  Recent Labs: 02/01/2017: ALT 12; B Natriuretic Peptide 253.0; TSH 1.783 02/04/2017: BUN 25; Creatinine, Ser 0.74; Hemoglobin 11.7; Magnesium 2.1; Platelets 355; Potassium 3.9; Sodium 137   Lipid Panel No results found for: CHOL, TRIG, HDL, CHOLHDL, VLDL, LDLCALC, LDLDIRECT  Additional studies/ records that were reviewed today include:   Echocardiogram: 12/2016 Study Conclusions  - Left ventricle: The cavity size was normal. Wall thickness was   normal. Systolic function was moderately to severely reduced. The   estimated ejection fraction was in the range of 30% to 35%. - Left atrium: The atrium was mildly dilated.  Impressions:  - Poor acoustic windows limit study Compared to report from 2015   LVEF is depressed.  TEE: 02/04/2017 Study Conclusions  - Left ventricle: The cavity size was normal. Systolic function was   normal. The estimated ejection fraction was in the range of 60%   to 65%. No evidence of thrombus. - Mitral valve: There was mild regurgitation. - Left atrium: The atrium was moderately dilated. No evidence of   thrombus in the atrial cavity or appendage. Normal LAA emptying   velocity of 68 cm/s. - Right atrium: No evidence of thrombus in the atrial cavity or   appendage.  Impressions:  - Successful cardioversion. No cardiac source of emboli was   indentified.  Recommendations:  No evidence of intracardiac thrombus. Proceed with cardioversion.   Assessment:    No diagnosis found.   Plan:   In order of problems listed above:  1. ***    Medication Adjustments/Labs and Tests Ordered: Current medicines are reviewed at length with the patient today.  Concerns regarding medicines are outlined above.  Medication changes, Labs and Tests ordered today are listed in the Patient  Instructions below. There are no Patient Instructions on file for this visit.   Signed, Erma Heritage, PA-C  02/19/2017 12:02 PM    [place] [address] [place],  [address] Phone: [phone]

## 2017-02-20 ENCOUNTER — Other Ambulatory Visit: Payer: Self-pay

## 2017-02-20 ENCOUNTER — Inpatient Hospital Stay (HOSPITAL_COMMUNITY)
Admission: EM | Admit: 2017-02-20 | Discharge: 2017-02-28 | DRG: 292 | Disposition: A | Discharge status: Skilled Nursing Facility | Payer: Medicare Other | Attending: Internal Medicine | Admitting: Internal Medicine

## 2017-02-20 ENCOUNTER — Ambulatory Visit: Payer: Medicare Other | Admitting: Urology

## 2017-02-20 ENCOUNTER — Emergency Department (HOSPITAL_COMMUNITY): Payer: Medicare Other

## 2017-02-20 ENCOUNTER — Encounter (HOSPITAL_COMMUNITY): Payer: Self-pay | Admitting: Emergency Medicine

## 2017-02-20 ENCOUNTER — Telehealth: Payer: Self-pay | Admitting: Student

## 2017-02-20 DIAGNOSIS — Z955 Presence of coronary angioplasty implant and graft: Secondary | ICD-10-CM

## 2017-02-20 DIAGNOSIS — I10 Essential (primary) hypertension: Secondary | ICD-10-CM | POA: Diagnosis not present

## 2017-02-20 DIAGNOSIS — E876 Hypokalemia: Secondary | ICD-10-CM | POA: Diagnosis not present

## 2017-02-20 DIAGNOSIS — R600 Localized edema: Secondary | ICD-10-CM | POA: Diagnosis not present

## 2017-02-20 DIAGNOSIS — I428 Other cardiomyopathies: Secondary | ICD-10-CM | POA: Diagnosis not present

## 2017-02-20 DIAGNOSIS — Z885 Allergy status to narcotic agent status: Secondary | ICD-10-CM

## 2017-02-20 DIAGNOSIS — R404 Transient alteration of awareness: Secondary | ICD-10-CM | POA: Diagnosis not present

## 2017-02-20 DIAGNOSIS — I447 Left bundle-branch block, unspecified: Secondary | ICD-10-CM | POA: Diagnosis present

## 2017-02-20 DIAGNOSIS — K5190 Ulcerative colitis, unspecified, without complications: Secondary | ICD-10-CM | POA: Diagnosis present

## 2017-02-20 DIAGNOSIS — Z79899 Other long term (current) drug therapy: Secondary | ICD-10-CM | POA: Diagnosis not present

## 2017-02-20 DIAGNOSIS — I4891 Unspecified atrial fibrillation: Secondary | ICD-10-CM

## 2017-02-20 DIAGNOSIS — I110 Hypertensive heart disease with heart failure: Principal | ICD-10-CM | POA: Diagnosis present

## 2017-02-20 DIAGNOSIS — I5023 Acute on chronic systolic (congestive) heart failure: Secondary | ICD-10-CM | POA: Diagnosis not present

## 2017-02-20 DIAGNOSIS — H3530 Unspecified macular degeneration: Secondary | ICD-10-CM | POA: Diagnosis present

## 2017-02-20 DIAGNOSIS — M6281 Muscle weakness (generalized): Secondary | ICD-10-CM | POA: Diagnosis not present

## 2017-02-20 DIAGNOSIS — I2510 Atherosclerotic heart disease of native coronary artery without angina pectoris: Secondary | ICD-10-CM | POA: Diagnosis present

## 2017-02-20 DIAGNOSIS — I5042 Chronic combined systolic (congestive) and diastolic (congestive) heart failure: Secondary | ICD-10-CM | POA: Diagnosis present

## 2017-02-20 DIAGNOSIS — R262 Difficulty in walking, not elsewhere classified: Secondary | ICD-10-CM | POA: Diagnosis not present

## 2017-02-20 DIAGNOSIS — I429 Cardiomyopathy, unspecified: Secondary | ICD-10-CM | POA: Diagnosis not present

## 2017-02-20 DIAGNOSIS — Z7982 Long term (current) use of aspirin: Secondary | ICD-10-CM

## 2017-02-20 DIAGNOSIS — Z888 Allergy status to other drugs, medicaments and biological substances status: Secondary | ICD-10-CM

## 2017-02-20 DIAGNOSIS — M1990 Unspecified osteoarthritis, unspecified site: Secondary | ICD-10-CM | POA: Diagnosis not present

## 2017-02-20 DIAGNOSIS — Z7901 Long term (current) use of anticoagulants: Secondary | ICD-10-CM | POA: Diagnosis not present

## 2017-02-20 DIAGNOSIS — I509 Heart failure, unspecified: Secondary | ICD-10-CM

## 2017-02-20 DIAGNOSIS — I5022 Chronic systolic (congestive) heart failure: Secondary | ICD-10-CM | POA: Diagnosis not present

## 2017-02-20 DIAGNOSIS — E785 Hyperlipidemia, unspecified: Secondary | ICD-10-CM | POA: Diagnosis present

## 2017-02-20 DIAGNOSIS — Z880 Allergy status to penicillin: Secondary | ICD-10-CM

## 2017-02-20 DIAGNOSIS — F329 Major depressive disorder, single episode, unspecified: Secondary | ICD-10-CM | POA: Diagnosis not present

## 2017-02-20 DIAGNOSIS — R0602 Shortness of breath: Secondary | ICD-10-CM | POA: Diagnosis not present

## 2017-02-20 DIAGNOSIS — I480 Paroxysmal atrial fibrillation: Secondary | ICD-10-CM | POA: Diagnosis not present

## 2017-02-20 DIAGNOSIS — R0902 Hypoxemia: Secondary | ICD-10-CM

## 2017-02-20 DIAGNOSIS — I481 Persistent atrial fibrillation: Secondary | ICD-10-CM | POA: Diagnosis not present

## 2017-02-20 DIAGNOSIS — Z96653 Presence of artificial knee joint, bilateral: Secondary | ICD-10-CM | POA: Diagnosis present

## 2017-02-20 DIAGNOSIS — R279 Unspecified lack of coordination: Secondary | ICD-10-CM | POA: Diagnosis not present

## 2017-02-20 DIAGNOSIS — R531 Weakness: Secondary | ICD-10-CM | POA: Diagnosis not present

## 2017-02-20 DIAGNOSIS — I4819 Other persistent atrial fibrillation: Secondary | ICD-10-CM

## 2017-02-20 DIAGNOSIS — I25119 Atherosclerotic heart disease of native coronary artery with unspecified angina pectoris: Secondary | ICD-10-CM | POA: Diagnosis not present

## 2017-02-20 LAB — CBC WITH DIFFERENTIAL/PLATELET
BASOS ABS: 0 10*3/uL (ref 0.0–0.1)
Basophils Relative: 0 %
Eosinophils Absolute: 0.2 10*3/uL (ref 0.0–0.7)
Eosinophils Relative: 2 %
HEMATOCRIT: 36.6 % (ref 36.0–46.0)
Hemoglobin: 11.5 g/dL — ABNORMAL LOW (ref 12.0–15.0)
LYMPHS PCT: 22 %
Lymphs Abs: 1.7 10*3/uL (ref 0.7–4.0)
MCH: 26 pg (ref 26.0–34.0)
MCHC: 31.4 g/dL (ref 30.0–36.0)
MCV: 82.6 fL (ref 78.0–100.0)
MONO ABS: 0.7 10*3/uL (ref 0.1–1.0)
Monocytes Relative: 10 %
NEUTROS ABS: 4.9 10*3/uL (ref 1.7–7.7)
Neutrophils Relative %: 66 %
Platelets: 331 10*3/uL (ref 150–400)
RBC: 4.43 MIL/uL (ref 3.87–5.11)
RDW: 14.9 % (ref 11.5–15.5)
WBC: 7.4 10*3/uL (ref 4.0–10.5)

## 2017-02-20 LAB — BASIC METABOLIC PANEL
Anion gap: 13 (ref 5–15)
BUN: 20 mg/dL (ref 6–20)
CHLORIDE: 101 mmol/L (ref 101–111)
CO2: 21 mmol/L — AB (ref 22–32)
CREATININE: 0.78 mg/dL (ref 0.44–1.00)
Calcium: 9.1 mg/dL (ref 8.9–10.3)
GFR calc non Af Amer: >60 mL/min (ref >60)
Glucose, Bld: 110 mg/dL — ABNORMAL HIGH (ref 65–99)
Potassium: 3.6 mmol/L (ref 3.5–5.1)
Sodium: 135 mmol/L (ref 135–145)

## 2017-02-20 LAB — TROPONIN I: Troponin I: <0.03 ng/mL (ref <0.03)

## 2017-02-20 LAB — BRAIN NATRIURETIC PEPTIDE: B Natriuretic Peptide: 465 pg/mL — ABNORMAL HIGH (ref 0.0–100.0)

## 2017-02-20 MED ORDER — SODIUM CHLORIDE 0.9% FLUSH: 3.0000 mL | INTRAVENOUS | Status: DC | PRN

## 2017-02-20 MED ORDER — FUROSEMIDE 10 MG/ML IJ SOLN
40.0000 mg | Freq: Two times a day (BID) | INTRAMUSCULAR | Status: DC
  Administered 2017-02-21 – 2017-02-28 (×15): 40 mg via INTRAVENOUS
  Filled 2017-02-20 (×15): qty 4

## 2017-02-20 MED ORDER — ATORVASTATIN CALCIUM 20 MG PO TABS
20.0000 mg | ORAL_TABLET | Freq: Every day | ORAL | Status: DC
  Administered 2017-02-20 – 2017-02-27 (×8): 20 mg via ORAL
  Filled 2017-02-20 (×8): qty 1

## 2017-02-20 MED ORDER — ONDANSETRON HCL 4 MG/2ML IJ SOLN
4.0000 mg | Freq: Four times a day (QID) | INTRAMUSCULAR | Status: DC | PRN
  Administered 2017-02-23: 4 mg via INTRAVENOUS
  Filled 2017-02-20: qty 2

## 2017-02-20 MED ORDER — BALSALAZIDE DISODIUM 750 MG PO CAPS: 2250.0000 mg | ORAL_CAPSULE | Freq: Three times a day (TID) | ORAL | Status: DC

## 2017-02-20 MED ORDER — ACETAMINOPHEN 325 MG PO TABS: 650.0000 mg | ORAL_TABLET | ORAL | Status: DC | PRN

## 2017-02-20 MED ORDER — KETOTIFEN FUMARATE 0.025 % OP SOLN
2.0000 [drp] | Freq: Three times a day (TID) | OPHTHALMIC | Status: DC | PRN
  Filled 2017-02-20: qty 5

## 2017-02-20 MED ORDER — SODIUM CHLORIDE 0.9 % IV SOLN
250.0000 mL | INTRAVENOUS | Status: DC | PRN
  Administered 2017-02-23 – 2017-02-24 (×2): 250 mL via INTRAVENOUS

## 2017-02-20 MED ORDER — SODIUM CHLORIDE 0.9% FLUSH
3.0000 mL | Freq: Two times a day (BID) | INTRAVENOUS | Status: DC
  Administered 2017-02-20 – 2017-02-28 (×12): 3 mL via INTRAVENOUS

## 2017-02-20 MED ORDER — ASPIRIN EC 81 MG PO TBEC
81.0000 mg | DELAYED_RELEASE_TABLET | Freq: Every day | ORAL | Status: DC
  Administered 2017-02-20 – 2017-02-28 (×9): 81 mg via ORAL
  Filled 2017-02-20 (×9): qty 1

## 2017-02-20 MED ORDER — RIVAROXABAN 15 MG PO TABS
15.0000 mg | ORAL_TABLET | Freq: Every day | ORAL | Status: DC
  Administered 2017-02-20 – 2017-02-27 (×8): 15 mg via ORAL
  Filled 2017-02-20 (×8): qty 1

## 2017-02-20 MED ORDER — DILTIAZEM HCL-DEXTROSE 100-5 MG/100ML-% IV SOLN (PREMIX): 5.0000 mg/h | Freq: Once | INTRAVENOUS | Status: DC

## 2017-02-20 MED ORDER — METOPROLOL TARTRATE 5 MG/5ML IV SOLN
2.5000 mg | Freq: Four times a day (QID) | INTRAVENOUS | Status: DC | PRN
  Filled 2017-02-20: qty 5

## 2017-02-20 MED ORDER — FUROSEMIDE 10 MG/ML IJ SOLN
40.0000 mg | Freq: Once | INTRAMUSCULAR | Status: AC
Start: 2017-02-20 — End: 2017-02-20
  Administered 2017-02-20: 40 mg via INTRAVENOUS
  Filled 2017-02-20: qty 4

## 2017-02-20 MED ORDER — POTASSIUM CHLORIDE CRYS ER 20 MEQ PO TBCR
20.0000 meq | EXTENDED_RELEASE_TABLET | Freq: Every day | ORAL | Status: DC
  Administered 2017-02-20 – 2017-02-22 (×3): 20 meq via ORAL
  Filled 2017-02-20 (×3): qty 1

## 2017-02-20 MED ORDER — METOPROLOL SUCCINATE ER 50 MG PO TB24
50.0000 mg | ORAL_TABLET | Freq: Every day | ORAL | Status: DC
  Administered 2017-02-21 – 2017-02-27 (×7): 50 mg via ORAL
  Filled 2017-02-20 (×8): qty 1

## 2017-02-20 NOTE — ED Provider Notes (Signed)
[facility] EMERGENCY DEPARTMENT Provider Note   CSN: [id number] Arrival date & time: 02/20/17  1204     History   Chief Complaint Chief Complaint  Patient presents with  . Shortness of Breath    HPI [name] is a 84 y.o. female.  HPI  Pt was seen at 1245. Per pt, c/o gradual onset and worsening of persistent SOB for the past 2 days. Has been associated with increasing pedal edema. SOB worsens with exertion and laying flat. Pt states she slept in a chair last night due to her symptoms. Denies CP/palpitations, no cough, no abd pain, no N/V/D, no back pain, no fevers.    Past Medical History:  Diagnosis Date  . Arthritis   . Atrial flutter ([place])   . Coronary atherosclerosis of native coronary artery    BMS RCA May 2014 - [place] stent  . Depression   . Essential hypertension, benign   . Hyperlipemia   . Macular degeneration   . Paroxysmal atrial fibrillation (HCC)   . Secondary cardiomyopathy (HCC)    LVEF 35% - improved to 45-50%  . Ulcerative colitis     Patient Active Problem List   Diagnosis Date Noted  . Acute on chronic systolic CHF (congestive heart failure) ([place]) 02/20/2017  . Acute dyspnea   . Chest pain 02/01/2017  . Acute systolic CHF (congestive heart failure) ([place]) 01/21/2017  . Atrial fibrillation with RVR ([place]) 01/02/2017  . Arthritis of midfoot 01/13/2016  . Right shoulder pain 01/13/2016  . Ulcerative colitis ([place]) 05/03/2014  . Bilateral leg edema 11/30/2013  . Abnormal myocardial perfusion study 09/17/2013  . Coronary atherosclerosis of native coronary artery 09/03/2012  . Secondary cardiomyopathy ([place]) 09/03/2012  . Mixed hyperlipidemia 01/21/2009  . Essential hypertension, benign 01/21/2009  . Atrial flutter ([place]) 10/06/2008    Past Surgical History:  Procedure Laterality Date  . ABDOMINAL HYSTERECTOMY    . APPENDECTOMY    . Bilateral knee replacements      2007, 2008  . BLADDER SURGERY    . CARDIOVERSION N/A 02/04/2017   Procedure: CARDIOVERSION;  Surgeon: Arnoldo Lenis, MD;  [facility];  Service: Endoscopy;  Laterality: N/A;  . COLONOSCOPY N/A 06/15/2015   Procedure: COLONOSCOPY;  Surgeon: Rogene Houston, MD;  Location: [facility] ENDO [facility];  Service: Endoscopy;  Laterality: N/A;  210  . CYSTOSCOPY W/ RETROGRADES Bilateral 05/14/2016   Procedure: CYSTOSCOPY WITH BILATERAL RETROGRADE PYELOGRAM;  Surgeon: Raynelle Bring, MD;  [facility]: [facility] ORS;  Service: Urology;  Laterality: Bilateral;  GENERAL ANESTHESIA WITH PARALYSIS  . LEFT HEART CATHETERIZATION WITH CORONARY ANGIOGRAM N/A 10/30/2013   Procedure: LEFT HEART CATHETERIZATION WITH CORONARY ANGIOGRAM;  Surgeon: Burnell Blanks, MD;  Location: [facility] CATH LAB;  Service: Cardiovascular;  Laterality: N/A;  . TEE WITHOUT CARDIOVERSION N/A 02/04/2017   Procedure: TRANSESOPHAGEAL ECHOCARDIOGRAM (TEE) WITH PROPOL;  Surgeon: Arnoldo Lenis, MD;  [facility];  Service: Endoscopy;  Laterality: N/A;  . TONSILLECTOMY    . TOTAL KNEE ARTHROPLASTY    . TRANSURETHRAL RESECTION OF BLADDER TUMOR N/A 05/14/2016   Procedure: TRANSURETHRAL RESECTION OF BLADDER TUMOR (TURBT);  Surgeon: Raynelle Bring, MD;  [facility]S;  Service: Urology;  Laterality: N/A;  GENERAL ANESTHESIA WITH PARALYSIS  . YAG LASER APPLICATION Bilateral 99/03/7167   Procedure: YAG LASER APPLICATION;  Surgeon: Williams Che, MD;  Location: [facility] ORS;  Service: Ophthalmology;  Laterality: Bilateral;    OB History    No data available  Home Medications    Prior to Admission medications   Medication Sig Start Date End Date Taking? Authorizing Provider  acetaminophen (TYLENOL ARTHRITIS PAIN) 650 MG CR tablet Take 1,300 mg by mouth every 8 (eight) hours.    Yes [provider]  ASPIRIN ADULT LOW STRENGTH 81 MG EC tablet Take 81 mg by mouth daily. 01/25/17  Yes [provider]  atorvastatin (LIPITOR) 20 MG tablet TAKE ONE TABLET BY MOUTH AT BEDTIME 02/14/17  Yes  Satira Sark, MD  balsalazide (COLAZAL) 750 MG capsule TAKE THREE CAPSULES BY MOUTH THREE TIMES DAILY (REPLACING DELZICOL) Patient taking differently: TAKE THREE CAPSULES (2250 MG) BY MOUTH THREE TIMES DAILY (REPLACING DELZICOL) 12/09/16  Yes Rehman, Mechele Dawley, MD  furosemide (LASIX) 40 MG tablet Take 1 tablet (40 mg total) by mouth daily. 01/07/17  Yes Satira Sark, MD  ketotifen [facility] ALLERGY) 0.025 % ophthalmic solution Place 2 drops into both eyes 3 (three) times daily as needed (for dry eyes).    Yes [provider]  losartan (COZAAR) 25 MG tablet TAKE ONE TABLET BY MOUTH DAILY 02/14/17  Yes Satira Sark, MD  metoprolol succinate (TOPROL-XL) 50 MG 24 hr tablet Take 1 tablet (50 mg total) by mouth daily. Take with or immediately following a meal. 02/06/17  Yes Dhungel, Nishant, MD  Multiple Vitamins-Minerals (ICAPS AREDS 2 PO) Take 2 tablets by mouth daily at 6 PM.    Yes [provider]  nitroGLYCERIN (NITROSTAT) 0.4 MG SL tablet Place 1 tablet (0.4 mg total) under the tongue every 5 (five) minutes as needed for chest pain. 02/06/17 05/07/17 Yes Satira Sark, MD  potassium chloride SA (K-DUR,KLOR-CON) 20 MEQ tablet Take 1 tablet (20 mEq total) by mouth daily. 01/07/17  Yes Satira Sark, MD  XARELTO 15 MG TABS tablet TAKE ONE TABLET BY MOUTH EVERY DAY 02/14/17  Yes Satira Sark, MD    Family History No family history on file.  Social History Social History   Tobacco Use  . Smoking status: Never Smoker  . Smokeless tobacco: Never Used  Substance Use Topics  . Alcohol use: No    Alcohol/week: 0.0 oz  . Drug use: No     Allergies   Sinus & allergy [chlorpheniramine-phenylephrine]; Demerol; and Penicillins   Review of Systems Review of Systems ROS: Statement: All systems negative except as marked or noted in the HPI; Constitutional: Negative for fever and chills. ; ; Eyes: Negative for eye pain, redness and discharge. ; ; ENMT:  Negative for ear pain, hoarseness, nasal congestion, sinus pressure and sore throat. ; ; Cardiovascular: Negative for chest pain, palpitations, diaphoresis, +dyspnea and peripheral edema. ; ; Respiratory: Negative for cough, wheezing and stridor. ; ; Gastrointestinal: Negative for nausea, vomiting, diarrhea, abdominal pain, blood in stool, hematemesis, jaundice and rectal bleeding. . ; ; Genitourinary: Negative for dysuria, flank pain and hematuria. ; ; Musculoskeletal: Negative for back pain and neck pain. Negative for swelling and trauma.; ; Skin: Negative for pruritus, rash, abrasions, blisters, bruising and skin lesion.; ; Neuro: Negative for headache, lightheadedness and neck stiffness. Negative for weakness, altered level of consciousness, altered mental status, extremity weakness, paresthesias, involuntary movement, seizure and syncope.       Physical Exam Updated Vital Signs BP 114/82   Pulse (!) 50   Temp 98.1 F (36.7 C) (Oral)   Resp (!) 35   Ht 5' 3"  (1.6 m)   Wt 102.5 kg (226 lb)   SpO2 97%  BMI 40.03 kg/m    Patient Vitals for the past 24 hrs:  BP Temp Temp src Pulse Resp SpO2 Height Weight  02/20/17 1500 114/82 - - (!) 50 (!) 35 97 % - -  02/20/17 1430 (!) 117/92 - - 97 20 93 % - -  02/20/17 1400 101/67 - - 88 (!) 23 95 % - -  02/20/17 1330 (!) 88/58 - - (!) 31 (!) 25 90 % - -  02/20/17 1300 109/86 - - (!) 59 (!) 26 95 % - -  02/20/17 1256 114/73 98.1 F (36.7 C) Oral (!) 101 20 94 % - -  02/20/17 1252 - - - - - - 5' 3"  (1.6 m) 102.5 kg (226 lb)     Physical Exam 1250: Physical examination:  Nursing notes reviewed; Vital signs and O2 SAT reviewed;  Constitutional: Well developed, Well nourished, Well hydrated, Uncomfortable appearing.; Head:  Normocephalic, atraumatic; Eyes: EOMI, PERRL, No scleral icterus; ENMT: Mouth and pharynx normal, Mucous membranes moist; Neck: Supple, Full range of motion, No lymphadenopathy; Cardiovascular: Irregular tachycardic rate and  rhythm, No gallop; Respiratory: Breath sounds coarse & equal bilaterally, No wheezes. Sitting upright, tachypneic. Speaking sentences, Normal respiratory effort/excursion; Chest: Nontender, Movement normal; Abdomen: Soft, Nontender, Nondistended, Normal bowel sounds; Genitourinary: No CVA tenderness; Extremities: Pulses normal, No tenderness, +1 pedal edema bilat. No calf asymmetry.; Neuro: AA&Ox3, Major CN grossly intact.  Speech clear. No gross focal motor or sensory deficits in extremities.; Skin: Color pale, Warm, Dry.   ED Treatments / Results  Labs (all labs ordered are listed, but only abnormal results are displayed)   EKG  EKG Interpretation  Date/Time:  Wednesday February 20 2017 12:43:22 EST Ventricular Rate:  98 PR Interval:    QRS Duration: 159 QT Interval:  354 QTC Calculation: 452 R Axis:   -178 Text Interpretation:  Atrial fibrillation Nonspecific intraventricular conduction delay Baseline wander When compared with ECG of 01/21/2017 No significant change was found Confirmed by Francine Graven [phone]) on 02/20/2017 3:00:30 PM       Radiology   Procedures Procedures (including critical care time)  Medications Ordered in ED Medications  diltiazem (CARDIZEM) 100 mg in dextrose 5% 190m (1 mg/mL) infusion (0 mg/hr Intravenous Hold 02/20/17 1459)  furosemide (LASIX) injection 40 mg (40 mg Intravenous Given 02/20/17 1448)     Initial Impression / Assessment and Plan / ED Course  I have reviewed the triage vital signs and the nursing notes.  Pertinent labs & imaging results that were available during my care of the patient were reviewed by me and considered in my medical decision making (see chart for details).  MDM Reviewed: previous chart, nursing note and vitals Reviewed previous: labs and ECG Interpretation: labs, ECG and x-ray Total time providing critical care: 30-74 minutes. This excludes time spent performing separately reportable procedures and  services. Consults: admitting MD   CRITICAL CARE Performed by: MAlfonzo FellerTotal critical care time: 35 minutes Critical care time was exclusive of separately billable procedures and treating other patients. Critical care was necessary to treat or prevent imminent or life-threatening deterioration. Critical care was time spent personally by me on the following activities: development of treatment plan with patient and/or surrogate as well as nursing, discussions with consultants, evaluation of patient's response to treatment, examination of patient, obtaining history from patient or surrogate, ordering and performing treatments and interventions, ordering and review of laboratory studies, ordering and review of radiographic studies, pulse oximetry and re-evaluation of patient's condition.   Results  for orders placed or performed during the hospital encounter of 08/65/78  Basic metabolic panel  Result Value Ref Range   Sodium 135 135 - 145 mmol/L   Potassium 3.6 3.5 - 5.1 mmol/L   Chloride 101 101 - 111 mmol/L   CO2 21 (L) 22 - 32 mmol/L   Glucose, Bld 110 (H) 65 - 99 mg/dL   BUN 20 6 - 20 mg/dL   Creatinine, Ser 0.78 0.44 - 1.00 mg/dL   Calcium 9.1 8.9 - 10.3 mg/dL   GFR calc non Af Amer >60 >60 mL/min   GFR calc Af Amer >60 >60 mL/min   Anion gap 13 5 - 15  Brain natriuretic peptide  Result Value Ref Range   B Natriuretic Peptide 465.0 (H) 0.0 - 100.0 pg/mL  Troponin I  Result Value Ref Range   Troponin I <0.03 <0.03 ng/mL  CBC with Differential  Result Value Ref Range   WBC 7.4 4.0 - 10.5 K/uL   RBC 4.43 3.87 - 5.11 MIL/uL   Hemoglobin 11.5 (L) 12.0 - 15.0 g/dL   HCT 36.6 36.0 - 46.0 %   MCV 82.6 78.0 - 100.0 fL   MCH 26.0 26.0 - 34.0 pg   MCHC 31.4 30.0 - 36.0 g/dL   RDW 14.9 11.5 - 15.5 %   Platelets 331 150 - 400 K/uL   Neutrophils Relative % 66 %   Neutro Abs 4.9 1.7 - 7.7 K/uL   Lymphocytes Relative 22 %   Lymphs Abs 1.7 0.7 - 4.0 K/uL   Monocytes Relative  10 %   Monocytes Absolute 0.7 0.1 - 1.0 K/uL   Eosinophils Relative 2 %   Eosinophils Absolute 0.2 0.0 - 0.7 K/uL   Basophils Relative 0 %   Basophils Absolute 0.0 0.0 - 0.1 K/uL   Dg Chest Port 1 View Result Date: 02/20/2017 CLINICAL DATA:  Shortness of Breath, history of heart cath, hypertension EXAM: PORTABLE CHEST 1 VIEW COMPARISON:  02/01/2017 FINDINGS: Cardiomegaly. Mild peribronchial thickening and interstitial prominence. No confluent opacities or effusions. No acute bony abnormality. IMPRESSION: Cardiomegaly. Peribronchial thickening and interstitial prominence could reflect bronchitis or interstitial edema. Electronically Signed   By: Rolm Baptise M.D.   On: 02/20/2017 13:28    1425: Monitor with afib/RVR, rates 110-130's on arrival. IV cardizem gtt ordered. HPI c/w CHF exacerbation; IV lasix given. Dx and testing d/w pt and family.  Questions answered.  Verb understanding, agreeable to admit. T/C to [facility] Dr. Carles Collet, case discussed, including:  HPI, pertinent PM/SHx, VS/PE, dx testing, ED course and treatment:  Agreeable to admit.     Final Clinical Impressions(s) / ED Diagnoses   Final diagnoses:  Shortness of breath  Acute on chronic congestive heart failure, unspecified heart failure type [facility])  Atrial fibrillation with rapid ventricular response [facility])    ED Discharge Orders    None       Francine Graven, DO 02/22/17 1238

## 2017-02-20 NOTE — Telephone Encounter (Signed)
Patient states that she has appointment w/ [name] tomorrow but is having SOB and fatigue and wants to be seen today. No availability today. Please advise patient to go to ED. / tg

## 2017-02-20 NOTE — ED Triage Notes (Incomplete)
EMS brings in with SOB, pt states she sit up all night in a chair to breath, did not want to woke family.  Pt has increased SOB over last 2 days, not noticed increased e

## 2017-02-20 NOTE — H&P (Signed)
History and Physical  [name] HDQ:[id number] DOB: June 22, 1930 DOA: 02/20/2017   PCP: Rory Percy, MD   Patient coming from: Home  Chief Complaint: dyspnea  HPI:  [name] is a 84 y.o. female with medical history of paroxysmal atrial fibrillation, cardiomyopathy, hyperlipidemia, hypertension, depression presented with 3-day history of shortness of breath, increasing lower extremity edema, increasing abdominal girth, and orthopnea.  Patient was recently admitted to the hospital from 02/01/17  through 02/05/2017.  During that hospitalization, the patient was treated for atrial fibrillation with RVR.  She underwent TEE DCCV on 02/04/2017.  The patient endorses compliance with all her medications.  She denies any fevers, chills, headache, dizziness, nausea, vomiting, diarrhea, abdominal pain, dysuria, hematuria.  The patient had to sleep in a recliner on the evening of 02/19/2017 because of shortness of breath and orthopnea.  In the emergency department, the patient was noted to have atrial fibrillation with HR 100-110.  Chest x-ray showed increased interstitial prominence.  CBC, BMP, and troponins were unremarkable.  EKG showed atrial fibrillation with left bundle branch block.  BNP was 465.0.  The patient was started on intravenous furosemide.   Assessment/Plan: Acute on chronic systolic CHF -11/94/1740 echo EF 30-35% -Start IV furosemide 40 mg IV twice daily -Daily weights -Accurate I's and O's -likely tachycardia mediate -Holding losartan secondary to soft blood pressure -Continue metoprolol succinate  Atrial Fibrillation with RVR--persistent Afib -Despite DCCV, the pt has reverted back to Afib -consult cardiology -IV Lopressor as needed HR >120 -02/01/2017 TSH 1.783 -Continue rivaroxaban -Continue metoprolol succinate  Essential hypertension -Holding losartan secondary to soft blood pressure -Continue metoprolol succinate  Hyperlipidemia -Continue  statin  Coronary artery disease -status post BMS to the RCA in 2014.  No active angina symptoms. -EKG unchanged         Past Medical History:  Diagnosis Date  . Arthritis   . Atrial flutter ([place])   . Coronary atherosclerosis of native coronary artery    BMS RCA May 2014 - [place] stent  . Depression   . Essential hypertension, benign   . Hyperlipemia   . Macular degeneration   . Paroxysmal atrial fibrillation (HCC)   . Secondary cardiomyopathy (HCC)    LVEF 35% - improved to 45-50%  . Ulcerative colitis    Past Surgical History:  Procedure Laterality Date  . ABDOMINAL HYSTERECTOMY    . APPENDECTOMY    . Bilateral knee replacements      2007, 2008  . BLADDER SURGERY    . CARDIOVERSION N/A 02/04/2017   Procedure: CARDIOVERSION;  Surgeon: Arnoldo Lenis, MD;  [facility];  Service: Endoscopy;  Laterality: N/A;  . COLONOSCOPY N/A 06/15/2015   Procedure: COLONOSCOPY;  Surgeon: Rogene Houston, MD;  [facility];  Service: Endoscopy;  Laterality: N/A;  210  . CYSTOSCOPY W/ RETROGRADES Bilateral 05/14/2016   Procedure: CYSTOSCOPY WITH BILATERAL RETROGRADE PYELOGRAM;  Surgeon: Raynelle Bring, MD;  Location: [facility];  Service: Urology;  Laterality: Bilateral;  GENERAL ANESTHESIA WITH PARALYSIS  . LEFT HEART CATHETERIZATION WITH CORONARY ANGIOGRAM N/A 10/30/2013   Procedure: LEFT HEART CATHETERIZATION WITH CORONARY ANGIOGRAM;  Surgeon: Burnell Blanks, MD;  Location: [facility] CATH LAB;  Service: Cardiovascular;  Laterality: N/A;  . TEE WITHOUT CARDIOVERSION N/A 02/04/2017   Procedure: TRANSESOPHAGEAL ECHOCARDIOGRAM (TEE) WITH PROPOL;  Surgeon: Arnoldo Lenis, MD;  [facility];  Service: Endoscopy;  Laterality: N/A;  . TONSILLECTOMY    . TOTAL KNEE ARTHROPLASTY    .  TRANSURETHRAL RESECTION OF BLADDER TUMOR N/A 05/14/2016   Procedure: TRANSURETHRAL RESECTION OF BLADDER TUMOR (TURBT);  Surgeon: Raynelle Bring, MD;  [facility];  Service:  Urology;  Laterality: N/A;  GENERAL ANESTHESIA WITH PARALYSIS  . YAG LASER APPLICATION Bilateral 93/07/1694   Procedure: YAG LASER APPLICATION;  Surgeon: Williams Che, MD;  Location: [facility] ORS;  Service: Ophthalmology;  Laterality: Bilateral;   Social History:  reports that  has never smoked. she has never used smokeless tobacco. She reports that she does not drink alcohol or use drugs.   History reviewed. No pertinent family history.   Allergies  Allergen Reactions  . Sinus & Allergy [Chlorpheniramine-Phenylephrine] Shortness Of Breath  . Demerol Rash  . Penicillins Rash and Other (See Comments)    REACTION: rash, years ago Has patient had a PCN reaction causing immediate rash, facial/tongue/throat swelling, SOB or lightheadedness with hypotension: Yes Has patient had a PCN reaction causing severe rash involving mucus membranes or skin necrosis: No Has patient had a PCN reaction that required hospitalization No Has patient had a PCN reaction occurring within the last 10 years: No If all of the above answers are "NO", then may proceed with Cephalosporin use.      Prior to Admission medications   Medication Sig Start Date End Date Taking? Authorizing Provider  acetaminophen (TYLENOL ARTHRITIS PAIN) 650 MG CR tablet Take 1,300 mg by mouth every 8 (eight) hours.    Yes [provider]  ASPIRIN ADULT LOW STRENGTH 81 MG EC tablet Take 81 mg by mouth daily. 01/25/17  Yes [provider]  atorvastatin (LIPITOR) 20 MG tablet TAKE ONE TABLET BY MOUTH AT BEDTIME 02/14/17  Yes Satira Sark, MD  balsalazide (COLAZAL) 750 MG capsule TAKE THREE CAPSULES BY MOUTH THREE TIMES DAILY (REPLACING DELZICOL) Patient taking differently: TAKE THREE CAPSULES (2250 MG) BY MOUTH THREE TIMES DAILY (REPLACING DELZICOL) 12/09/16  Yes Rehman, Mechele Dawley, MD  furosemide (LASIX) 40 MG tablet Take 1 tablet (40 mg total) by mouth daily. 01/07/17  Yes Satira Sark, MD  ketotifen [facility]  ALLERGY) 0.025 % ophthalmic solution Place 2 drops into both eyes 3 (three) times daily as needed (for dry eyes).    Yes [provider]  losartan (COZAAR) 25 MG tablet TAKE ONE TABLET BY MOUTH DAILY 02/14/17  Yes Satira Sark, MD  metoprolol succinate (TOPROL-XL) 50 MG 24 hr tablet Take 1 tablet (50 mg total) by mouth daily. Take with or immediately following a meal. 02/06/17  Yes Dhungel, Nishant, MD  Multiple Vitamins-Minerals (ICAPS AREDS 2 PO) Take 2 tablets by mouth daily at 6 PM.    Yes [provider]  nitroGLYCERIN (NITROSTAT) 0.4 MG SL tablet Place 1 tablet (0.4 mg total) under the tongue every 5 (five) minutes as needed for chest pain. 02/06/17 05/07/17 Yes Satira Sark, MD  potassium chloride SA (K-DUR,KLOR-CON) 20 MEQ tablet Take 1 tablet (20 mEq total) by mouth daily. 01/07/17  Yes Satira Sark, MD  XARELTO 15 MG TABS tablet TAKE ONE TABLET BY MOUTH EVERY DAY 02/14/17  Yes Satira Sark, MD    Review of Systems:  Constitutional:  No weight loss, night sweats, Fevers, chills, fatigue.  Head&Eyes: No headache.  No vision loss.  No eye pain or scotoma ENT:  No Difficulty swallowing,Tooth/dental problems,Sore throat,  No ear ache, post nasal drip,  Cardio-vascular:  No chest pain,   dizziness, palpitations  GI:  No  abdominal pain, nausea, vomiting, diarrhea, loss of appetite,  hematochezia, melena, heartburn, indigestion, Resp:   No coughing up of blood .No wheezing.No chest wall deformity  Skin:  no rash or lesions.  GU:  no dysuria, change in color of urine, no urgency or frequency. No flank pain.  Musculoskeletal:  No joint pain or swelling. No decreased range of motion. No back pain.  Psych:  No change in mood or affect. No depression or anxiety. Neurologic: No headache, no dysesthesia, no focal weakness, no vision loss. No syncope  Physical Exam: Vitals:   02/20/17 1430 02/20/17 1500 02/20/17 1530 02/20/17 1655  BP: (!) 117/92  114/82 (!) 123/96   Pulse: 97 (!) 50 72   Resp: 20 (!) 35 (!) 26   Temp:      TempSrc:      SpO2: 93% 97% 96%   Weight:    100.3 kg (221 lb 1.9 oz)  Height:    5' 3"  (1.6 m)   General:  A&O x 3, NAD, nontoxic, pleasant/cooperative Head/Eye: No conjunctival hemorrhage, no icterus, [place]/AT, No nystagmus ENT:  No icterus,  No thrush, good dentition, no pharyngeal exudate Neck:  No masses, no lymphadenpathy, no bruits CV:  IRRR, no rub, no gallop, no S3 Lung: Bibasilar crackles.  No wheezing.  Good air movement Abdomen: soft/NT, +BS, nondistended, no peritoneal signs Ext: No cyanosis, No rashes, No petechiae, No lymphangitis, No edema Neuro: CNII-XII intact, strength 4/5 in bilateral upper and lower extremities, no dysmetria  Labs on Admission:  Basic Metabolic Panel: Recent Labs  Lab 02/20/17 1253  NA 135  K 3.6  CL 101  CO2 21*  GLUCOSE 110*  BUN 20  CREATININE 0.78  CALCIUM 9.1   Liver Function Tests: No results for input(s): AST, ALT, ALKPHOS, BILITOT, PROT, ALBUMIN in the last 168 hours. No results for input(s): LIPASE, AMYLASE in the last 168 hours. No results for input(s): AMMONIA in the last 168 hours. CBC: Recent Labs  Lab 02/20/17 1253  WBC 7.4  NEUTROABS 4.9  HGB 11.5*  HCT 36.6  MCV 82.6  PLT 331   Coagulation Profile: No results for input(s): INR, PROTIME in the last 168 hours. Cardiac Enzymes: Recent Labs  Lab 02/20/17 1253 02/20/17 1704  TROPONINI <0.03 <0.03   BNP: Invalid input(s): POCBNP CBG: No results for input(s): GLUCAP in the last 168 hours. Urine analysis:    Component Value Date/Time   COLORURINE STRAW (A) 01/02/2017 1420   APPEARANCEUR CLEAR 01/02/2017 1420   LABSPEC 1.008 01/02/2017 1420   PHURINE 7.0 01/02/2017 1420   GLUCOSEU NEGATIVE 01/02/2017 1420   HGBUR NEGATIVE 01/02/2017 1420   BILIRUBINUR NEGATIVE 01/02/2017 1420   KETONESUR NEGATIVE 01/02/2017 1420   PROTEINUR NEGATIVE 01/02/2017 1420   NITRITE NEGATIVE  01/02/2017 1420   LEUKOCYTESUR NEGATIVE 01/02/2017 1420   Sepsis Labs: @LABRCNTIP (procalcitonin:4,lacticidven:4) )No results found for this or any previous visit (from the past 240 hour(s)).   Radiological Exams on Admission: Dg Chest Port 1 View  Result Date: 02/20/2017 CLINICAL DATA:  Shortness of Breath, history of heart cath, hypertension EXAM: PORTABLE CHEST 1 VIEW COMPARISON:  02/01/2017 FINDINGS: Cardiomegaly. Mild peribronchial thickening and interstitial prominence. No confluent opacities or effusions. No acute bony abnormality. IMPRESSION: Cardiomegaly. Peribronchial thickening and interstitial prominence could reflect bronchitis or interstitial edema. Electronically Signed   By: Rolm Baptise M.D.   On: 02/20/2017 13:28    EKG: Independently reviewed. Afib, LBBB--unchanged    Time spent:60 minutes Code Status:   FULL Family Communication:  Spouse updated at bedside Disposition Plan: expect  2-3 day hospitalization Consults called: cardiology DVT Prophylaxis: Genella Mech, DO  [facility] Pager [phone]  If 7PM-7AM, please contact night-coverage [email or web address] Password [facility] 02/20/2017, 6:06 PM

## 2017-02-20 NOTE — Telephone Encounter (Signed)
Returned pt's call. Informed by husband that pt is currently on her way to the hospital.

## 2017-02-20 NOTE — ED Triage Notes (Signed)
Computer shutdown, to finish note, no increased edema, EDP at the bedside.

## 2017-02-21 ENCOUNTER — Other Ambulatory Visit: Payer: Self-pay

## 2017-02-21 ENCOUNTER — Ambulatory Visit: Payer: Medicare Other | Admitting: Student

## 2017-02-21 ENCOUNTER — Encounter (HOSPITAL_COMMUNITY): Payer: Self-pay | Admitting: Student

## 2017-02-21 DIAGNOSIS — I5023 Acute on chronic systolic (congestive) heart failure: Secondary | ICD-10-CM

## 2017-02-21 DIAGNOSIS — I4891 Unspecified atrial fibrillation: Secondary | ICD-10-CM

## 2017-02-21 LAB — BASIC METABOLIC PANEL
Anion gap: 15 (ref 5–15)
BUN: 21 mg/dL — ABNORMAL HIGH (ref 6–20)
CHLORIDE: 102 mmol/L (ref 101–111)
CO2: 21 mmol/L — ABNORMAL LOW (ref 22–32)
CREATININE: 0.87 mg/dL (ref 0.44–1.00)
Calcium: 9.4 mg/dL (ref 8.9–10.3)
GFR calc Af Amer: >60 mL/min (ref >60)
GFR calc non Af Amer: 59 mL/min — ABNORMAL LOW (ref >60)
GLUCOSE: 108 mg/dL — AB (ref 65–99)
POTASSIUM: 3.6 mmol/L (ref 3.5–5.1)
Sodium: 138 mmol/L (ref 135–145)

## 2017-02-21 LAB — TROPONIN I

## 2017-02-21 MED ORDER — AMIODARONE HCL IN DEXTROSE 360-4.14 MG/200ML-% IV SOLN
60.0000 mg/h | INTRAVENOUS | Status: DC
  Filled 2017-02-21: qty 200

## 2017-02-21 MED ORDER — AMIODARONE LOAD VIA INFUSION
150.0000 mg | Freq: Once | INTRAVENOUS | Status: AC
  Administered 2017-02-21: 150 mg via INTRAVENOUS
  Filled 2017-02-21: qty 83.34

## 2017-02-21 MED ORDER — AMIODARONE HCL IN DEXTROSE 360-4.14 MG/200ML-% IV SOLN
30.0000 mg/h | INTRAVENOUS | Status: DC
  Administered 2017-02-22 – 2017-02-26 (×9): 30 mg/h via INTRAVENOUS
  Filled 2017-02-21 (×11): qty 200

## 2017-02-21 MED ORDER — AMIODARONE HCL IN DEXTROSE 360-4.14 MG/200ML-% IV SOLN
60.0000 mg/h | INTRAVENOUS | Status: AC
  Administered 2017-02-21 (×2): 60 mg/h via INTRAVENOUS
  Filled 2017-02-21 (×2): qty 200

## 2017-02-21 MED ORDER — AMIODARONE HCL IN DEXTROSE 360-4.14 MG/200ML-% IV SOLN
30.0000 mg/h | INTRAVENOUS | Status: DC
  Filled 2017-02-21 (×2): qty 200

## 2017-02-21 MED ORDER — AMIODARONE LOAD VIA INFUSION
150.0000 mg | Freq: Once | INTRAVENOUS | Status: DC
  Filled 2017-02-21: qty 83.34

## 2017-02-21 NOTE — Progress Notes (Signed)
PROGRESS NOTE  [name] OFB:[id number] DOB: 05-20-1930 DOA: 02/20/2017 PCP: Rory Percy, MD  Brief History:   84 y.o. female with medical history of paroxysmal atrial fibrillation, cardiomyopathy, hyperlipidemia, hypertension, depression presented with 3-day history of shortness of breath, increasing lower extremity edema, increasing abdominal girth, and orthopnea.  Patient was recently admitted to the hospital from 02/01/17  through 02/05/2017.  During that hospitalization, the patient was treated for atrial fibrillation with RVR.  She underwent TEE DCCV on 02/04/2017.  The patient endorses compliance with all her medications.  She denies any fevers, chills, headache, dizziness, nausea, vomiting, diarrhea, abdominal pain, dysuria, hematuria.  The patient had to sleep in a recliner on the evening of 02/19/2017 because of shortness of breath and orthopnea.  In the emergency department, the patient was noted to have atrial fibrillation with HR 100-110.  Chest x-ray showed increased interstitial prominence.  CBC, BMP, and troponins were unremarkable.  EKG showed atrial fibrillation with left bundle branch block.  BNP was 465.0.  The patient was started on intravenous furosemide.    Assessment/Plan: Acute on chronic systolic CHF -78/24/2353 echo EF 30-35% -Continue IV furosemide 40 mg IV twice daily -Daily weights--NEG 3 lbs -NEG 1.1L -likely tachycardia mediate -Holding losartan secondary to soft blood pressure -Continue metoprolol succinate  Atrial Fibrillation with RVR--persistent Afib -Despite DCCV, the pt has reverted back to Afib -consult cardiology--appreciated -2/14--discussed with Dr. Franki Cabot amiodarone -IV Lopressor as needed HR >120 -02/01/2017 TSH 1.783 -Continue rivaroxaban -Continue metoprolol succinate  Essential hypertension -Holding losartan secondary to soft blood pressure -Continue metoprolol succinate  Hyperlipidemia -Continue  statin  Coronary artery disease -status post BMS to the RCA in 2014. No active angina symptoms. -EKG unchanged     Disposition Plan:   Transfer to SDU Family Communication:   Daughter updated at bedside 2/14--Total time spent 35 minutes.  Greater than 50% spent face to face counseling and coordinating care.   Consultants:  cardiology  Code Status:  FULL   DVT Prophylaxis: Xarelto   Procedures: As Listed in Progress Note Above  Antibiotics: None    Subjective: Patient denies fevers, chills, headache, chest pain, dyspnea, nausea, vomiting, diarrhea, abdominal pain, dysuria, hematuria, hematochezia, and melena.   Objective: Vitals:   02/20/17 1800 02/20/17 2028 02/21/17 0428 02/21/17 1412  BP: (!) 87/49 105/61 123/84 98/66  Pulse: 80 77 96 100  Resp:      Temp:  98.3 F (36.8 C) 98.2 F (36.8 C)   TempSrc:  Oral Oral   SpO2: 93% 96% 95%   Weight:   101.2 kg (223 lb 1.7 oz)   Height:        Intake/Output Summary (Last 24 hours) at 02/21/2017 1745 Last data filed at 02/21/2017 1300 Gross per 24 hour  Intake 480 ml  Output 1600 ml  Net -1120 ml   Weight change:  Exam:   General:  Pt is alert, follows commands appropriately, not in acute distress  HEENT: No icterus, No thrush, No neck mass, [place]/AT  Cardiovascular: IRRR, S1/S2, no rubs, no gallops  Respiratory: Fine bibasilar crackles but no wheezing.  Good air movement.  Abdomen: Soft/+BS, non tender, non distended, no guarding  Extremities: 1+ LE edema, No lymphangitis, No petechiae, No rashes, no synovitis   Data Reviewed: I have personally reviewed following labs and imaging studies Basic Metabolic Panel: Recent Labs  Lab 02/20/17 1253 02/21/17 0432  NA 135 138  K 3.6 3.6  CL  101 102  CO2 21* 21*  GLUCOSE 110* 108*  BUN 20 21*  CREATININE 0.78 0.87  CALCIUM 9.1 9.4   Liver Function Tests: No results for input(s): AST, ALT, ALKPHOS, BILITOT, PROT, ALBUMIN in the last 168 hours. No  results for input(s): LIPASE, AMYLASE in the last 168 hours. No results for input(s): AMMONIA in the last 168 hours. Coagulation Profile: No results for input(s): INR, PROTIME in the last 168 hours. CBC: Recent Labs  Lab 02/20/17 1253  WBC 7.4  NEUTROABS 4.9  HGB 11.5*  HCT 36.6  MCV 82.6  PLT 331   Cardiac Enzymes: Recent Labs  Lab 02/20/17 1253 02/20/17 1704 02/21/17 0004  TROPONINI <0.03 <0.03 <0.03   BNP: Invalid input(s): POCBNP CBG: No results for input(s): GLUCAP in the last 168 hours. HbA1C: No results for input(s): HGBA1C in the last 72 hours. Urine analysis:    Component Value Date/Time   COLORURINE STRAW (A) 01/02/2017 1420   APPEARANCEUR CLEAR 01/02/2017 1420   LABSPEC 1.008 01/02/2017 1420   PHURINE 7.0 01/02/2017 1420   GLUCOSEU NEGATIVE 01/02/2017 1420   HGBUR NEGATIVE 01/02/2017 1420   BILIRUBINUR NEGATIVE 01/02/2017 1420   KETONESUR NEGATIVE 01/02/2017 1420   PROTEINUR NEGATIVE 01/02/2017 1420   NITRITE NEGATIVE 01/02/2017 1420   LEUKOCYTESUR NEGATIVE 01/02/2017 1420   Sepsis Labs: @LABRCNTIP (procalcitonin:4,lacticidven:4) )No results found for this or any previous visit (from the past 240 hour(s)).   Scheduled Meds: . aspirin EC  81 mg Oral Daily  . atorvastatin  20 mg Oral QHS  . furosemide  40 mg Intravenous BID  . metoprolol succinate  50 mg Oral Daily  . potassium chloride SA  20 mEq Oral Daily  . Rivaroxaban  15 mg Oral QAC supper  . sodium chloride flush  3 mL Intravenous Q12H   Continuous Infusions: . sodium chloride    . amiodarone 60 mg/hr (02/21/17 1500)   Followed by  . amiodarone      Procedures/Studies: Dg Chest 2 View  Result Date: 02/01/2017 CLINICAL DATA:  Chest pain and shortness of breath EXAM: CHEST  2 VIEW COMPARISON:  January 21, 2017 FINDINGS: There is interstitial pulmonary edema. There is no airspace consolidation. There is pulmonary venous hypertension with cardiomegaly. There is no evident adenopathy. There  is degenerative change in each shoulder. There is superior migration of the right humeral head. IMPRESSION: Interstitial edema with cardiomegaly and pulmonary venous hypertension. There is likely a degree of congestive heart failure. No consolidation. No adenopathy. Degenerative change in each shoulder with probable chronic rotator cuff tear on the right. Electronically Signed   By: Lowella Grip III M.D.   On: 02/01/2017 13:40   Dg Chest Port 1 View  Result Date: 02/20/2017 CLINICAL DATA:  Shortness of Breath, history of heart cath, hypertension EXAM: PORTABLE CHEST 1 VIEW COMPARISON:  02/01/2017 FINDINGS: Cardiomegaly. Mild peribronchial thickening and interstitial prominence. No confluent opacities or effusions. No acute bony abnormality. IMPRESSION: Cardiomegaly. Peribronchial thickening and interstitial prominence could reflect bronchitis or interstitial edema. Electronically Signed   By: Rolm Baptise M.D.   On: 02/20/2017 13:28    Orson Eva, DO  [facility] Pager [phone]  If 7PM-7AM, please contact night-coverage [email or web address] Password [email or web address] 02/21/2017, 5:45 PM   LOS: 1 day

## 2017-02-21 NOTE — Consult Note (Signed)
Cardiology Consult    Patient ID: CRISTIN PENAFLOR; [id number]; 1930-03-23   Admit date: 02/20/2017 Date of Consult: 02/21/2017  Primary Care Provider: Rory Percy, MD Primary Cardiologist: Rozann Lesches, MD    Patient Profile    SAHORY NORDLING is a 84 y.o. female with past medical history of CAD (s/p DES to RCA in 05/2012), chronic combined systolic and diastolic CHF (EF 78-24% by echo in 12/2016, 35-40% by TEE in 01/2017), persistent atrial fibrillation (on Xarelto, s/p DCCV on 02/05/2017), HTN, and HLD who is being seen today for the evaluation of atrial fibrillation with RVR and CHF at the request of Dr. Carles Collet.   History of Present Illness    She was evaluated by Dr. Domenic Polite on 01/25/2017 and reported worsening fatigue since being diagnosed with atrial fibrillation. Options were reviewed and it was recommended she proceed with an outpatient cardioversion. She presented to [facility] on 02/01/2017 for the procedure but reported missing a dose of Xarelto the week prior. After the procedure was canceled, she developed worsening shortness of breath and was directed to the emergency department for further evaluation. She was admitted for further evaluation and plans for a TEE-guided cardioversion the following Monday. She underwent successful DCCV on 02/04/2017 with 200J x2 with return to NSR. She was monitored overnight and continued to maintain normal sinus rhythm. She was continued on Toprol-XL 69m daily and Xarelto at the time of discharge.   [facility] worsening dyspnea on exertion and orthopnea over the past 2 days. Says she started to feel very fatigued on the day she initially left the hospital and symptoms continued to progress. She would check her HR at home and it would be elevated in the 90's to 120's. Notes occasional palpitations. No recent chest pain, PND, or lower extremity edema. Was following her weights at home and says this had  been stable at 210 lbs but upon obtaining new scales two days ago, her weight was elevated to 226 lbs.    Initial labs show WBC 7.4, Hgb 11.5, platelets 331, Na+ 135, K+ 3.6, and creatine 0.78. Initial and cyclic troponin values have been negative. BNP 465.EKG showed atrial fibrillation, HR 98, with known LBBB. CXR showed cardiomegaly with peribronchial thickening and bronchitis or interstitial edema.   She has been admitted for further treatment and started on IV Lasix 432mBID. Minimal recorded output but weight is down 3 lbs (226 --> 223 lbs). HR has remained elevated in the 110's to 130's.   Past Medical History:  Diagnosis Date  . Arthritis   . Atrial flutter ([facility]  . Coronary atherosclerosis of native coronary artery    BMS RCA May 2014 - [facility]  . Depression   . Essential hypertension, benign   . Hyperlipemia   . Macular degeneration   . Paroxysmal atrial fibrillation (HCC)   . Secondary cardiomyopathy (HCC)    LVEF 35% - improved to 45-50%  . Ulcerative colitis     Past Surgical History:  Procedure Laterality Date  . ABDOMINAL HYSTERECTOMY    . APPENDECTOMY    . Bilateral knee replacements      2007, 2008  . BLADDER SURGERY    . CARDIOVERSION N/A 02/04/2017   Procedure: CARDIOVERSION;  Surgeon: BrArnoldo LenisMD;  [facility];  Service: Endoscopy;  Laterality: N/A;  . COLONOSCOPY N/A 06/15/2015   Procedure: COLONOSCOPY;  Surgeon: NaRogene HoustonMD;  Location: [facility]  ENDO SUITE;  Service: Endoscopy;  Laterality: N/A;  210  . CYSTOSCOPY W/ RETROGRADES Bilateral 05/14/2016   Procedure: CYSTOSCOPY WITH BILATERAL RETROGRADE PYELOGRAM;  Surgeon: Raynelle Bring, MD;  Location: [facility];  Service: Urology;  Laterality: Bilateral;  GENERAL ANESTHESIA WITH PARALYSIS  . LEFT HEART CATHETERIZATION WITH CORONARY ANGIOGRAM N/A 10/30/2013   Procedure: LEFT HEART CATHETERIZATION WITH CORONARY ANGIOGRAM;  Surgeon: Burnell Blanks, MD;  Location: [facility] CATH LAB;   Service: Cardiovascular;  Laterality: N/A;  . TEE WITHOUT CARDIOVERSION N/A 02/04/2017   Procedure: TRANSESOPHAGEAL ECHOCARDIOGRAM (TEE) WITH PROPOL;  Surgeon: Arnoldo Lenis, MD;  Location: [facility];  Service: Endoscopy;  Laterality: N/A;  . TONSILLECTOMY    . TOTAL KNEE ARTHROPLASTY    . TRANSURETHRAL RESECTION OF BLADDER TUMOR N/A 05/14/2016   Procedure: TRANSURETHRAL RESECTION OF BLADDER TUMOR (TURBT);  Surgeon: Raynelle Bring, MD;  Location: [facility] ORS;  Service: Urology;  Laterality: N/A;  GENERAL ANESTHESIA WITH PARALYSIS  . YAG LASER APPLICATION Bilateral 87/08/6765   Procedure: YAG LASER APPLICATION;  Surgeon: Williams Che, MD;  Location: [facility] ORS;  Service: Ophthalmology;  Laterality: Bilateral;     Home Medications:  Prior to Admission medications   Medication Sig Start Date End Date Taking? Authorizing Provider  acetaminophen (TYLENOL ARTHRITIS PAIN) 650 MG CR tablet Take 1,300 mg by mouth every 8 (eight) hours.    Yes [provider]  ASPIRIN ADULT LOW STRENGTH 81 MG EC tablet Take 81 mg by mouth daily. 01/25/17  Yes [provider]  atorvastatin (LIPITOR) 20 MG tablet TAKE ONE TABLET BY MOUTH AT BEDTIME 02/14/17  Yes Satira Sark, MD  balsalazide (COLAZAL) 750 MG capsule TAKE THREE CAPSULES BY MOUTH THREE TIMES DAILY (REPLACING DELZICOL) Patient taking differently: TAKE THREE CAPSULES (2250 MG) BY MOUTH THREE TIMES DAILY (REPLACING DELZICOL) 12/09/16  Yes Rehman, Mechele Dawley, MD  furosemide (LASIX) 40 MG tablet Take 1 tablet (40 mg total) by mouth daily. 01/07/17  Yes Satira Sark, MD  ketotifen [facility] ALLERGY) 0.025 % ophthalmic solution Place 2 drops into both eyes 3 (three) times daily as needed (for dry eyes).    Yes [provider]  losartan (COZAAR) 25 MG tablet TAKE ONE TABLET BY MOUTH DAILY 02/14/17  Yes Satira Sark, MD  metoprolol succinate (TOPROL-XL) 50 MG 24 hr tablet Take 1 tablet (50 mg total) by mouth daily. Take with or  immediately following a meal. 02/06/17  Yes Dhungel, Nishant, MD  Multiple Vitamins-Minerals (ICAPS AREDS 2 PO) Take 2 tablets by mouth daily at 6 PM.    Yes [provider]  nitroGLYCERIN (NITROSTAT) 0.4 MG SL tablet Place 1 tablet (0.4 mg total) under the tongue every 5 (five) minutes as needed for chest pain. 02/06/17 05/07/17 Yes Satira Sark, MD  potassium chloride SA (K-DUR,KLOR-CON) 20 MEQ tablet Take 1 tablet (20 mEq total) by mouth daily. 01/07/17  Yes Satira Sark, MD  XARELTO 15 MG TABS tablet TAKE ONE TABLET BY MOUTH EVERY DAY 02/14/17  Yes Satira Sark, MD    Inpatient Medications: Scheduled Meds: . amiodarone  150 mg Intravenous Once  . aspirin EC  81 mg Oral Daily  . atorvastatin  20 mg Oral QHS  . balsalazide  2,250 mg Oral TID  . furosemide  40 mg Intravenous BID  . metoprolol succinate  50 mg Oral Daily  . potassium chloride SA  20 mEq Oral Daily  . Rivaroxaban  15 mg Oral QAC supper  . sodium chloride  flush  3 mL Intravenous Q12H   Continuous Infusions: . sodium chloride    . amiodarone     Followed by  . amiodarone     PRN Meds: sodium chloride, acetaminophen, ketotifen, ondansetron (ZOFRAN) IV, sodium chloride flush  Allergies:    Allergies  Allergen Reactions  . Sinus & Allergy [Chlorpheniramine-Phenylephrine] Shortness Of Breath  . Demerol Rash  . Penicillins Rash and Other (See Comments)    REACTION: rash, years ago Has patient had a PCN reaction causing immediate rash, facial/tongue/throat swelling, SOB or lightheadedness with hypotension: Yes Has patient had a PCN reaction causing severe rash involving mucus membranes or skin necrosis: No Has patient had a PCN reaction that required hospitalization No Has patient had a PCN reaction occurring within the last 10 years: No If all of the above answers are "NO", then may proceed with Cephalosporin use.     Social History:   Social History   Socioeconomic History  . Marital  status: Married    Spouse name: Not on file  . Number of children: Not on file  . Years of education: Not on file  . Highest education level: Not on file  Social Needs  . Financial resource strain: Not on file  . Food insecurity - worry: Not on file  . Food insecurity - inability: Not on file  . Transportation needs - medical: Not on file  . Transportation needs - non-medical: Not on file  Occupational History  . Not on file  Tobacco Use  . Smoking status: Never Smoker  . Smokeless tobacco: Never Used  Substance and Sexual Activity  . Alcohol use: No    Alcohol/week: 0.0 oz  . Drug use: No  . Sexual activity: Not Currently  Other Topics Concern  . Not on file  Social History Narrative  . Not on file     Family History:    Family History  Problem Relation Age of Onset  . CAD Father       Review of Systems    General:  No chills, fever, night sweats or weight changes.  Cardiovascular:  No chest pain, edema, paroxysmal nocturnal dyspnea. Positive for dyspnea on exertion, orthopnea, and palpitations.  Dermatological: No rash, lesions/masses Respiratory: Positive for cough and dyspnea. Urologic: No hematuria, dysuria Abdominal:   No nausea, vomiting, diarrhea, bright red blood per rectum, melena, or hematemesis Neurologic:  No visual changes, wkns, changes in mental status. All other systems reviewed and are otherwise negative except as noted above.  Physical Exam/Data    Vitals:   02/20/17 1655 02/20/17 1800 02/20/17 2028 02/21/17 0428  BP:  (!) 87/49 105/61 123/84  Pulse:  80 77 96  Resp:      Temp:   98.3 F (36.8 C) 98.2 F (36.8 C)  TempSrc:   Oral Oral  SpO2:  93% 96% 95%  Weight: 221 lb 1.9 oz (100.3 kg)   223 lb 1.7 oz (101.2 kg)  Height: 5' 3"  (1.6 m)       Intake/Output Summary (Last 24 hours) at 02/21/2017 1106 Last data filed at 02/21/2017 0900 Gross per 24 hour  Intake 480 ml  Output 700 ml  Net -220 ml   Filed Weights   02/20/17 1252  02/20/17 1655 02/21/17 0428  Weight: 226 lb (102.5 kg) 221 lb 1.9 oz (100.3 kg) 223 lb 1.7 oz (101.2 kg)   Body mass index is 39.52 kg/m.   General: Pleasant, NAD Psych: Normal affect. Neuro: Alert and oriented X  3. Moves all extremities spontaneously. HEENT: Normal  Neck: Supple without bruits or JVD. Lungs:  Resp regular and unlabored, mild rales along right base. Heart: Irregularly irregular, no s3, s4, or murmurs. Abdomen: Soft, non-tender, BS + x 4. Appears mildly distended.  Extremities: No clubbing, cyanosis or edema. DP/PT/Radials 2+ and equal bilaterally.   EKG:  The EKG was personally reviewed and demonstrates: Atrial fibrillation, HR 98, with known LBBB.  Telemetry:  Telemetry was personally reviewed and demonstrates: Atrial fibrillation, HR in 110's to 130's.   Labs/Studies     Relevant CV Studies:  Echocardiogram: 01/03/2017 Study Conclusions  - Left ventricle: The cavity size was normal. Wall thickness was   normal. Systolic function was moderately to severely reduced. The   estimated ejection fraction was in the range of 30% to 35%. - Left atrium: The atrium was mildly dilated.  Impressions:  - Poor acoustic windows limit study Compared to report from 2015   LVEF is depressed.  Laboratory Data:  Chemistry Recent Labs  Lab 02/20/17 1253 02/21/17 0432  NA 135 138  K 3.6 3.6  CL 101 102  CO2 21* 21*  GLUCOSE 110* 108*  BUN 20 21*  CREATININE 0.78 0.87  CALCIUM 9.1 9.4  GFRNONAA >60 59*  GFRAA >60 >60  ANIONGAP 13 15    No results for input(s): PROT, ALBUMIN, AST, ALT, ALKPHOS, BILITOT in the last 168 hours. Hematology Recent Labs  Lab 02/20/17 1253  WBC 7.4  RBC 4.43  HGB 11.5*  HCT 36.6  MCV 82.6  MCH 26.0  MCHC 31.4  RDW 14.9  PLT 331   Cardiac Enzymes Recent Labs  Lab 02/20/17 1253 02/20/17 1704 02/21/17 0004  TROPONINI <0.03 <0.03 <0.03   No results for input(s): TROPIPOC in the last 168 hours.  BNP Recent Labs  Lab  02/20/17 1253  BNP 465.0*    DDimer No results for input(s): DDIMER in the last 168 hours.  Radiology/Studies:  Dg Chest Port 1 View  Result Date: 02/20/2017 CLINICAL DATA:  Shortness of Breath, history of heart cath, hypertension EXAM: PORTABLE CHEST 1 VIEW COMPARISON:  02/01/2017 FINDINGS: Cardiomegaly. Mild peribronchial thickening and interstitial prominence. No confluent opacities or effusions. No acute bony abnormality. IMPRESSION: Cardiomegaly. Peribronchial thickening and interstitial prominence could reflect bronchitis or interstitial edema. Electronically Signed   By: Rolm Baptise M.D.   On: 02/20/2017 13:28     Assessment & Plan    1. Acute on Chronic Systolic CHF - the patient has a known reduced EF of 30-35% by echo in 12/2016, thought to be possibly tachycardia-mediated in the setting of atrial fibrillation with RVR. - presented with worsening dyspnea on exertion, orthopnea, and abdominal distension.  - BNP elevated to 465. CXR showed cardiomegaly with peribronchial thickening and bronchitis or interstitial edema. She has been started on IV Lasix 54m BID with minimal recorded output but weight is down 3 lbs (226 --> 223 lbs). Would continue with IV diuresis at this time. Repeat BMET in AM.  - continue Toprol-XL. ARB currently held secondary to hypotension.   2. Persistent Atrial Fibrillation with RVR - the patient underwent successful TEE-guided DCCV on 02/04/2017 with 200J x2 with return to NSR. Feels like she went back into atrial fibrillation the day following recent hospital discharge. - HR has remained elevated in the 110's to 130's on telemetry. Currently on Toprol-XL 564mdaily. Can further titrate AV nodal blocking agent. She has experienced issues with hypotension in the past and we may need to  consider the addition of antiarrhythmic therapy with Amiodarone if rates remain difficult to control.    3. CAD - s/p DES to RCA in 05/2012. - she denies any recent chest  discomfort. Has experienced dyspnea in the setting of atrial fibrillation and CHF. - continue BB and statin therapy. No ASA secondary to the need for anticoagulation.   4. HTN - BP has been soft since admission. Losartan held.  - continue Toprol-XL 59m daily.     For questions or updates, please contact CIrontonPlease consult [email or web address] for contact info under Cardiology/STEMI.  Signed, BErma Heritage PA-C 02/21/2017, 11:06 AM Pager: [phone] Attending note  Patient seen and discussed with PA SAhmed Prima I agree with her documentation above. 84yo female history of CAD [place] HF LVEF 300-93%from echo 12/2016. Her drop in LVEF is fairly new, and thought possibly related to tachcyardia CM. She is s/p TEE/DCCV Feb 04 2017. Admitted with 3 days of SOB, LE dema, and orthopnea.   In ER patient noted to be back in afib with RVR.    K 3.6, Cr 0.78, BUN 20, BNP 465, WBC 7.4 Hgb 11.5 Plt 331  Trop neg x 3 CXR bronchitis vs edema Afib with LBBB rate 98 TTE 12/2016 LVEF 30-35% TEE LVEF 35-40% (addendum made, mistakingly reported as 60-65% originally)   Admitted with acute on chronic systolic HF. On IV lasix negative 5034movernight. Overall mild uptrend in Cr, continue IV diuretics. She is back in afib and historically has not tolerated well. Given her significant symptoms and suspected tachycardia CM we will try to get her back in NSR again. Given her CAD history and systolic dysfunction limited antiarrhythmic options, we will start amiodarone IV load, will need transfer to stepdown unit.    JoCarlyle DollyD

## 2017-02-22 DIAGNOSIS — I4819 Other persistent atrial fibrillation: Secondary | ICD-10-CM

## 2017-02-22 LAB — BASIC METABOLIC PANEL
ANION GAP: 12 (ref 5–15)
BUN: 21 mg/dL — ABNORMAL HIGH (ref 6–20)
CALCIUM: 9 mg/dL (ref 8.9–10.3)
CO2: 25 mmol/L (ref 22–32)
Chloride: 100 mmol/L — ABNORMAL LOW (ref 101–111)
Creatinine, Ser: 0.83 mg/dL (ref 0.44–1.00)
GFR calc Af Amer: >60 mL/min (ref >60)
GLUCOSE: 91 mg/dL (ref 65–99)
Potassium: 3.1 mmol/L — ABNORMAL LOW (ref 3.5–5.1)
SODIUM: 137 mmol/L (ref 135–145)

## 2017-02-22 LAB — MRSA PCR SCREENING: MRSA by PCR: NEGATIVE

## 2017-02-22 MED ORDER — LOSARTAN POTASSIUM 25 MG PO TABS
12.5000 mg | ORAL_TABLET | Freq: Every day | ORAL | Status: DC
  Administered 2017-02-22 – 2017-02-24 (×3): 12.5 mg via ORAL
  Filled 2017-02-22 (×6): qty 0.5

## 2017-02-22 MED ORDER — POTASSIUM CHLORIDE CRYS ER 20 MEQ PO TBCR
40.0000 meq | EXTENDED_RELEASE_TABLET | Freq: Once | ORAL | Status: AC
  Administered 2017-02-22: 40 meq via ORAL
  Filled 2017-02-22: qty 2

## 2017-02-22 MED ORDER — POTASSIUM CHLORIDE CRYS ER 20 MEQ PO TBCR
20.0000 meq | EXTENDED_RELEASE_TABLET | Freq: Two times a day (BID) | ORAL | Status: DC
  Administered 2017-02-23 – 2017-02-28 (×11): 20 meq via ORAL
  Filled 2017-02-22 (×11): qty 1

## 2017-02-22 MED ORDER — AMIODARONE LOAD VIA INFUSION
150.0000 mg | Freq: Once | INTRAVENOUS | Status: AC
  Administered 2017-02-22: 150 mg via INTRAVENOUS
  Filled 2017-02-22: qty 83.34

## 2017-02-22 NOTE — Progress Notes (Signed)
PROGRESS NOTE  [name] HGD:[id number] DOB: 10/28/1930 DOA: 02/20/2017 PCP: Rory Percy, MD Brief History:  84 y.o.femalewith medical history ofparoxysmal atrial fibrillation, cardiomyopathy, hyperlipidemia, hypertension, depression presented with 3-day history of shortness of breath, increasing lower extremity edema, increasing abdominal girth, and orthopnea. Patient was recently admitted to the hospital from 02/01/17 through 02/05/2017.During that hospitalization, the patient was treated for atrial fibrillation with RVR. She underwent TEE DCCVon 02/04/2017.The patient endorses compliance with all her medications. She denies any fevers, chills, headache, dizziness, nausea, vomiting, diarrhea, abdominal pain, dysuria, hematuria. The patient had to sleep in a recliner on the evening of 02/19/2017 because of shortness of breath and orthopnea. In the emergency department, the patient was noted to have atrial fibrillation with HR 100-110.Chest x-ray showed increased interstitial prominence. CBC, BMP, and troponins were unremarkable. EKG showed atrial fibrillation with left bundle branch block. BNP was 465.0. The patient was started on intravenous furosemide.  Cardiology was consulted to assist.  Amiodarone drip was started.    Assessment/Plan: Acute on chronic systolic CHF -96/22/2979 echo EF 30-35% -Continue IV furosemide 40 mg IV twice daily -Daily weights--question accuracy -NEG 1.3L -likely tachycardia mediate -restarted losartan lower dose -Continue metoprolol succinate  AtrialFibrillationwith RVR--persistent Afib -DespiteDCCV, the pt has reverted back to Afib -consultcardiology--appreciated -continue amiodarone drip (started 2/14) -02/01/2017 TSH 1.783 -Continue rivaroxaban -Continue metoprolol succinate  Essential hypertension -restart losartan lower dose -Continue metoprolol succinate  Hyperlipidemia -Continue statin  Coronary  artery disease -status post BMS to the RCA in 05/2012. No active angina symptoms. -EKG unchanged  Hypokalemia -replete -check mag   Disposition Plan:  Remain in SDU Family Communication:   Daughter updated at bedside 2/15   Consultants:  cardiology  Code Status:  FULL   DVT Prophylaxis: Xarelto   Procedures: As Listed in Progress Note Above  Antibiotics: None        Subjective: Patient still has intermittent shortness of breath with minimal exertion.  She denies any chest pain, fevers, chills, headache, nausea, vomiting, diarrhea, abdominal pain.  There is no dysuria hematuria.  Objective: Vitals:   02/22/17 1400 02/22/17 1500 02/22/17 1600 02/22/17 1700  BP: 114/81 122/76 (!) 129/110 112/81  Pulse: (!) 103 94 100 (!) 109  Resp: (!) 25 (!) 29 (!) 24 20  Temp:   98 F (36.7 C)   TempSrc:   Oral   SpO2: 94% 95% 95% 97%  Weight:      Height:        Intake/Output Summary (Last 24 hours) at 02/22/2017 1730 Last data filed at 02/22/2017 1600 Gross per 24 hour  Intake 1024.63 ml  Output 1420 ml  Net -395.37 ml   Weight change: -3.113 kg (-13.8 oz) Exam:   General:  Pt is alert, follows commands appropriately, not in acute distress  HEENT: No icterus, No thrush, No neck mass, [place]/AT  Cardiovascular: RRR, S1/S2, no rubs, no gallops  Respiratory: Bilateral crackles.  No wheezing.  Good air movement  Abdomen: Soft/+BS, non tender, non distended, no guarding  Extremities: 1 + LE edema, No lymphangitis, No petechiae, No rashes, no synovitis   Data Reviewed: I have personally reviewed following labs and imaging studies Basic Metabolic Panel: Recent Labs  Lab 02/20/17 1253 02/21/17 0432 02/22/17 0434  NA 135 138 137  K 3.6 3.6 3.1*  CL 101 102 100*  CO2 21* 21* 25  GLUCOSE 110* 108* 91  BUN 20 21* 21*  CREATININE 0.78 0.87 0.83  CALCIUM 9.1 9.4 9.0   Liver Function Tests: No results for input(s): AST, ALT, ALKPHOS, BILITOT, PROT,  ALBUMIN in the last 168 hours. No results for input(s): LIPASE, AMYLASE in the last 168 hours. No results for input(s): AMMONIA in the last 168 hours. Coagulation Profile: No results for input(s): INR, PROTIME in the last 168 hours. CBC: Recent Labs  Lab 02/20/17 1253  WBC 7.4  NEUTROABS 4.9  HGB 11.5*  HCT 36.6  MCV 82.6  PLT 331   Cardiac Enzymes: Recent Labs  Lab 02/20/17 1253 02/20/17 1704 02/21/17 0004  TROPONINI <0.03 <0.03 <0.03   BNP: Invalid input(s): POCBNP CBG: No results for input(s): GLUCAP in the last 168 hours. HbA1C: No results for input(s): HGBA1C in the last 72 hours. Urine analysis:    Component Value Date/Time   COLORURINE STRAW (A) 01/02/2017 1420   APPEARANCEUR CLEAR 01/02/2017 1420   LABSPEC 1.008 01/02/2017 1420   PHURINE 7.0 01/02/2017 1420   GLUCOSEU NEGATIVE 01/02/2017 1420   HGBUR NEGATIVE 01/02/2017 1420   BILIRUBINUR NEGATIVE 01/02/2017 1420   KETONESUR NEGATIVE 01/02/2017 1420   PROTEINUR NEGATIVE 01/02/2017 1420   NITRITE NEGATIVE 01/02/2017 1420   LEUKOCYTESUR NEGATIVE 01/02/2017 1420   Sepsis Labs: @LABRCNTIP (procalcitonin:4,lacticidven:4) ) Recent Results (from the past 240 hour(s))  MRSA PCR Screening     Status: None   Collection Time: 02/21/17 10:04 PM  Result Value Ref Range Status   MRSA by PCR NEGATIVE NEGATIVE Final    Comment:        The GeneXpert MRSA Assay (FDA approved for NASAL specimens only), is one component of a comprehensive MRSA colonization surveillance program. It is not intended to diagnose MRSA infection nor to guide or monitor treatment for MRSA infections. Performed at [facility], [address], [place], [place] [address]      Scheduled Meds: . aspirin EC  81 mg Oral Daily  . atorvastatin  20 mg Oral QHS  . furosemide  40 mg Intravenous BID  . losartan  12.5 mg Oral Daily  . metoprolol succinate  50 mg Oral Daily  . [START ON 02/23/2017] potassium chloride SA  20 mEq Oral BID  .  Rivaroxaban  15 mg Oral QAC supper  . sodium chloride flush  3 mL Intravenous Q12H   Continuous Infusions: . sodium chloride    . amiodarone 30 mg/hr (02/22/17 0133)    Procedures/Studies: Dg Chest 2 View  Result Date: 02/01/2017 CLINICAL DATA:  Chest pain and shortness of breath EXAM: CHEST  2 VIEW COMPARISON:  January 21, 2017 FINDINGS: There is interstitial pulmonary edema. There is no airspace consolidation. There is pulmonary venous hypertension with cardiomegaly. There is no evident adenopathy. There is degenerative change in each shoulder. There is superior migration of the right humeral head. IMPRESSION: Interstitial edema with cardiomegaly and pulmonary venous hypertension. There is likely a degree of congestive heart failure. No consolidation. No adenopathy. Degenerative change in each shoulder with probable chronic rotator cuff tear on the right. Electronically Signed   By: Lowella Grip III M.D.   On: 02/01/2017 13:40   Dg Chest Port 1 View  Result Date: 02/20/2017 CLINICAL DATA:  Shortness of Breath, history of heart cath, hypertension EXAM: PORTABLE CHEST 1 VIEW COMPARISON:  02/01/2017 FINDINGS: Cardiomegaly. Mild peribronchial thickening and interstitial prominence. No confluent opacities or effusions. No acute bony abnormality. IMPRESSION: Cardiomegaly. Peribronchial thickening and interstitial prominence could reflect bronchitis or interstitial edema. Electronically Signed   By: Rolm Baptise M.D.   On: 02/20/2017  13:[place], DO  [facility] Pager [phone]  If 7PM-7AM, please contact night-coverage [email or web address] Password [email or web address] 02/22/2017, 5:30 PM   LOS: 2 days

## 2017-02-22 NOTE — Progress Notes (Signed)
Progress Note  Patient Name: [name] Date of Encounter: 02/22/2017  Primary Cardiologist: Rozann Lesches, MD   Subjective   Some SOB  Inpatient Medications    Scheduled Meds: . aspirin EC  81 mg Oral Daily  . atorvastatin  20 mg Oral QHS  . furosemide  40 mg Intravenous BID  . metoprolol succinate  50 mg Oral Daily  . potassium chloride SA  20 mEq Oral Daily  . Rivaroxaban  15 mg Oral QAC supper  . sodium chloride flush  3 mL Intravenous Q12H   Continuous Infusions: . sodium chloride    . amiodarone 30 mg/hr (02/22/17 0133)   PRN Meds: sodium chloride, acetaminophen, ketotifen, ondansetron (ZOFRAN) IV, sodium chloride flush   Vital Signs    Vitals:   02/22/17 0500 02/22/17 0530 02/22/17 0600 02/22/17 0630  BP: 99/70 113/81 122/83 118/72  Pulse: 75 78 89 70  Resp: (!) 23 (!) 23 (!) 22 (!) 21  Temp:      TempSrc:      SpO2: 97% 99% 99% 95%  Weight: 219 lb 2.2 oz (99.4 kg)     Height:        Intake/Output Summary (Last 24 hours) at 02/22/2017 0812 Last data filed at 02/22/2017 0600 Gross per 24 hour  Intake 664.57 ml  Output 1520 ml  Net -855.43 ml   Filed Weights   02/20/17 1655 02/21/17 0428 02/22/17 0500  Weight: 221 lb 1.9 oz (100.3 kg) 223 lb 1.7 oz (101.2 kg) 219 lb 2.2 oz (99.4 kg)    Telemetry    afib - Personally Reviewed  ECG    n/a  Physical Exam   GEN: No acute distress.   Neck: elevated jvd Cardiac: irreg, no murmurs, rubs, or gallops.  Respiratory: bilateral crackles.  GI: Soft, nontender, non-distended  MS: No edema; No deformity. Neuro:  Nonfocal  Psych: Normal affect   Labs    Chemistry Recent Labs  Lab 02/20/17 1253 02/21/17 0432 02/22/17 0434  NA 135 138 137  K 3.6 3.6 3.1*  CL 101 102 100*  CO2 21* 21* 25  GLUCOSE 110* 108* 91  BUN 20 21* 21*  CREATININE 0.78 0.87 0.83  CALCIUM 9.1 9.4 9.0  GFRNONAA >60 59* >60  GFRAA >60 >60 >60  ANIONGAP 13 15 12      Hematology Recent Labs  Lab  02/20/17 1253  WBC 7.4  RBC 4.43  HGB 11.5*  HCT 36.6  MCV 82.6  MCH 26.0  MCHC 31.4  RDW 14.9  PLT 331    Cardiac Enzymes Recent Labs  Lab 02/20/17 1253 02/20/17 1704 02/21/17 0004  TROPONINI <0.03 <0.03 <0.03   No results for input(s): TROPIPOC in the last 168 hours.   BNP Recent Labs  Lab 02/20/17 1253  BNP 465.0*     DDimer No results for input(s): DDIMER in the last 168 hours.   Radiology    Dg Chest Port 1 View  Result Date: 02/20/2017 CLINICAL DATA:  Shortness of Breath, history of heart cath, hypertension EXAM: PORTABLE CHEST 1 VIEW COMPARISON:  02/01/2017 FINDINGS: Cardiomegaly. Mild peribronchial thickening and interstitial prominence. No confluent opacities or effusions. No acute bony abnormality. IMPRESSION: Cardiomegaly. Peribronchial thickening and interstitial prominence could reflect bronchitis or interstitial edema. Electronically Signed   By: Rolm Baptise M.D.   On: 02/20/2017 13:28    Cardiac Studies     Patient Profile     [name] is a 84 y.o. female with past medical  history of CAD (s/p DES to RCA in 05/2012), chronic combined systolic and diastolic CHF (EF 82-70% by echo in 12/2016, 35-40% by TEE in 01/2017), persistent atrial fibrillation (on Xarelto, s/p DCCV on 02/05/2017), HTN, and HLD who is being seen today for the evaluation of atrial fibrillation with RVR and CHF at the request of Dr. Carles Collet.     Assessment & Plan   1. Acute on chronic systolic HF - - the patient has a known reduced EF of 30-35% by echo in 12/2016, thought to be possibly tachycardia-mediated in the setting of atrial fibrillation with RVR. - presented with worsening dyspnea on exertion, orthopnea, and abdominal distension.  - negative 882m yesterday, negative 1.3 liters since admission. Renal function is stable, she is on lasix 458mIV bid - other medical therapy with Toprol 50. Losartan initially held due to soft bp's, will restart lower dose 12.6m29maily.  -  CHF likely exacerbated this admit due to afib with RVR - remains volume overloaded, continue IV lasix  2. Afib - TEE/DCCV last month, now back in afib with RVR - has not tolerated rhythm, we have pursued getting her back into SR - started on amio gtt yesterday - anticoag with xarelto. We have continued her home ASA, deferred to primary cardiology - continues to be in afib, we will rebolus amio 150m40md continue drip. May require amio load over the weekend with cardioversion Monday if she does not convert with amio alone   3. CAD - - s/p DES to RCA in 05/2012. - may consider ischemic evaluation at some point in the future due to her LVEF drop, defer to Dr McDoDomenic Polite Hypokalemia - will write for extra KCl dose     For questions or updates, please contact [facility] consult [email or web address] for contact info under Cardiology/STEMI.      SignMerrily Pew  02/22/2017, 8:12 AM

## 2017-02-22 NOTE — Care Management Note (Signed)
Case Management Note  Patient Details  Name: [name] MRN: [id number] Date of Birth: 02/15/1930  Subjective/Objective: Adm with CHF. Transferred to SDU after developing Rapid afib.  From home with husband. Ind with ADL's. Active with [facility].                 Action/Plan: CM following for needs. Anticipate return home with resumption of home health.    Expected Discharge Date:  02/23/17               Expected Discharge Plan:     In-House Referral:     Discharge planning Services  CM Consult, resumption of services  Post Acute Care Choice:    Choice offered to:     DME Arranged:    DME Agency:     HH Arranged:   [facility] Agency:     Status of Service:  In process, will continue to follow  If discussed at Long Length of Stay Meetings, dates discussed:    Additional Comments:  Arnav Cregg, [name], RN 02/22/2017, 12:56 PM

## 2017-02-23 LAB — BASIC METABOLIC PANEL
ANION GAP: 12 (ref 5–15)
BUN: 23 mg/dL — AB (ref 6–20)
CALCIUM: 9.1 mg/dL (ref 8.9–10.3)
CO2: 22 mmol/L (ref 22–32)
Chloride: 102 mmol/L (ref 101–111)
Creatinine, Ser: 0.9 mg/dL (ref 0.44–1.00)
GFR calc Af Amer: >60 mL/min (ref >60)
GFR, EST NON AFRICAN AMERICAN: 57 mL/min — AB (ref >60)
Glucose, Bld: 101 mg/dL — ABNORMAL HIGH (ref 65–99)
POTASSIUM: 3.6 mmol/L (ref 3.5–5.1)
SODIUM: 136 mmol/L (ref 135–145)

## 2017-02-23 LAB — MAGNESIUM: MAGNESIUM: 1.9 mg/dL (ref 1.7–2.4)

## 2017-02-23 NOTE — Progress Notes (Signed)
PROGRESS NOTE  [name] XYI:[id number] DOB: Oct 08, 1930 DOA: 02/20/2017 PCP: Rory Percy, MD  Brief History: 84 y.o.femalewith medical history ofparoxysmal atrial fibrillation, cardiomyopathy, hyperlipidemia, hypertension, depression presented with 3-day history of shortness of breath, increasing lower extremity edema, increasing abdominal girth, and orthopnea. Patient was recently admitted to the hospital from 02/01/17 through 02/05/2017.During that hospitalization, the patient was treated for atrial fibrillation with RVR. She underwent TEE DCCVon 02/04/2017.The patient endorses compliance with all her medications. She denies any fevers, chills, headache, dizziness, nausea, vomiting, diarrhea, abdominal pain, dysuria, hematuria. The patient had to sleep in a recliner on the evening of 02/19/2017 because of shortness of breath and orthopnea. In the emergency department, the patient was noted to have atrial fibrillation with HR 100-110.Chest x-ray showed increased interstitial prominence. CBC, BMP, and troponins were unremarkable. EKG showed atrial fibrillation with left bundle branch block. BNP was 465.0. The patient was started on intravenous furosemide.  Cardiology was consulted to assist.  Amiodarone drip was started.    Assessment/Plan: Acute on chronic systolic CHF -27/07/8673 echo EF 30-35% -ContinueIV furosemide 40 mg IV twice daily -Daily weights--question accuracy--NEG 8 lbs since admit -NEG 1.5L -likely tachycardia mediate -restarted losartan lower dose -Continue metoprolol succinate -am BNP  AtrialFibrillationwith RVR--persistent Afib -DespiteDCCV, the pt has reverted back to Afib -consultcardiology--appreciated -continue amiodarone drip (started 2/14) -remains in Afib -02/01/2017 TSH 1.783 -Continue rivaroxaban -Continue metoprolol succinate  Essential hypertension -restarted losartan lower dose -Continue metoprolol  succinate  Hyperlipidemia -Continue statin  Coronary artery disease -status post BMS to the RCA in 05/2012. No active angina symptoms. -EKG unchanged  Hypokalemia -replete -check mag--1.9   Disposition Plan:Remain in SDU Family Communication:Daughter updatedat bedside 2/16   Consultants:cardiology  Code Status: FULL   DVT Prophylaxis:Xarelto   Procedures: As Listed in Progress Note Above  Antibiotics: None      Subjective: Patient denies fevers, chills, headache, chest pain, dyspnea, nausea, vomiting, diarrhea, abdominal pain, dysuria, hematuria, hematochezia, and melena.      Objective: Vitals:   02/23/17 0754 02/23/17 1059 02/23/17 1124 02/23/17 1635  BP:  101/65    Pulse: 71 (!) 101 (!) 101 86  Resp: (!) 22  (!) 37 (!) 21  Temp: 98.8 F (37.1 C)  98.7 F (37.1 C) 99.1 F (37.3 C)  TempSrc: Oral  Oral Oral  SpO2: 93%  95% 96%  Weight:      Height:        Intake/Output Summary (Last 24 hours) at 02/23/2017 1731 Last data filed at 02/23/2017 1200 Gross per 24 hour  Intake 700.4 ml  Output 900 ml  Net -199.6 ml   Weight change: -0.3 kg (-10.6 oz) Exam:   General:  Pt is alert, follows commands appropriately, not in acute distress  HEENT: No icterus, No thrush, No neck mass, [place]/AT  Cardiovascular: IRRR, S1/S2, no rubs, no gallops  Respiratory: Fine bibasilar rales.  No wheezing.  Good air movement  Abdomen: Soft/+BS, non tender, non distended, no guarding  Extremities: 1 + LE edema, No lymphangitis, No petechiae, No rashes, no synovitis   Data Reviewed: I have personally reviewed following labs and imaging studies Basic Metabolic Panel: Recent Labs  Lab 02/20/17 1253 02/21/17 0432 02/22/17 0434 02/23/17 0455  NA 135 138 137 136  K 3.6 3.6 3.1* 3.6  CL 101 102 100* 102  CO2 21* 21* 25 22  GLUCOSE 110* 108* 91 101*  BUN 20 21* 21* 23*  CREATININE 0.78  0.87 0.83 0.90  CALCIUM 9.1 9.4 9.0 9.1  MG  --    --   --  1.9   Liver Function Tests: No results for input(s): AST, ALT, ALKPHOS, BILITOT, PROT, ALBUMIN in the last 168 hours. No results for input(s): LIPASE, AMYLASE in the last 168 hours. No results for input(s): AMMONIA in the last 168 hours. Coagulation Profile: No results for input(s): INR, PROTIME in the last 168 hours. CBC: Recent Labs  Lab 02/20/17 1253  WBC 7.4  NEUTROABS 4.9  HGB 11.5*  HCT 36.6  MCV 82.6  PLT 331   Cardiac Enzymes: Recent Labs  Lab 02/20/17 1253 02/20/17 1704 02/21/17 0004  TROPONINI <0.03 <0.03 <0.03   BNP: Invalid input(s): POCBNP CBG: No results for input(s): GLUCAP in the last 168 hours. HbA1C: No results for input(s): HGBA1C in the last 72 hours. Urine analysis:    Component Value Date/Time   COLORURINE STRAW (A) 01/02/2017 1420   APPEARANCEUR CLEAR 01/02/2017 1420   LABSPEC 1.008 01/02/2017 1420   PHURINE 7.0 01/02/2017 1420   GLUCOSEU NEGATIVE 01/02/2017 1420   HGBUR NEGATIVE 01/02/2017 1420   BILIRUBINUR NEGATIVE 01/02/2017 1420   KETONESUR NEGATIVE 01/02/2017 1420   PROTEINUR NEGATIVE 01/02/2017 1420   NITRITE NEGATIVE 01/02/2017 1420   LEUKOCYTESUR NEGATIVE 01/02/2017 1420   Sepsis Labs: @LABRCNTIP (procalcitonin:4,lacticidven:4) ) Recent Results (from the past 240 hour(s))  MRSA PCR Screening     Status: None   Collection Time: 02/21/17 10:04 PM  Result Value Ref Range Status   MRSA by PCR NEGATIVE NEGATIVE Final    Comment:        The GeneXpert MRSA Assay (FDA approved for NASAL specimens only), is one component of a comprehensive MRSA colonization surveillance program. It is not intended to diagnose MRSA infection nor to guide or monitor treatment for MRSA infections. Performed at [facility], [address], [place], [place] [address]      Scheduled Meds: . aspirin EC  81 mg Oral Daily  . atorvastatin  20 mg Oral QHS  . furosemide  40 mg Intravenous BID  . losartan  12.5 mg Oral Daily  . metoprolol  succinate  50 mg Oral Daily  . potassium chloride SA  20 mEq Oral BID  . Rivaroxaban  15 mg Oral QAC supper  . sodium chloride flush  3 mL Intravenous Q12H   Continuous Infusions: . sodium chloride    . amiodarone 30 mg/hr (02/23/17 4193)    Procedures/Studies: Dg Chest 2 View  Result Date: 02/01/2017 CLINICAL DATA:  Chest pain and shortness of breath EXAM: CHEST  2 VIEW COMPARISON:  January 21, 2017 FINDINGS: There is interstitial pulmonary edema. There is no airspace consolidation. There is pulmonary venous hypertension with cardiomegaly. There is no evident adenopathy. There is degenerative change in each shoulder. There is superior migration of the right humeral head. IMPRESSION: Interstitial edema with cardiomegaly and pulmonary venous hypertension. There is likely a degree of congestive heart failure. No consolidation. No adenopathy. Degenerative change in each shoulder with probable chronic rotator cuff tear on the right. Electronically Signed   By: Lowella Grip III M.D.   On: 02/01/2017 13:40   Dg Chest Port 1 View  Result Date: 02/20/2017 CLINICAL DATA:  Shortness of Breath, history of heart cath, hypertension EXAM: PORTABLE CHEST 1 VIEW COMPARISON:  02/01/2017 FINDINGS: Cardiomegaly. Mild peribronchial thickening and interstitial prominence. No confluent opacities or effusions. No acute bony abnormality. IMPRESSION: Cardiomegaly. Peribronchial thickening and interstitial prominence could reflect bronchitis or interstitial  edema. Electronically Signed   By: Rolm Baptise M.D.   On: 02/20/2017 13:28    Orson Eva, DO  [facility] Pager [phone]  If 7PM-7AM, please contact night-coverage [email or web address] Password [email or web address] 02/23/2017, 5:31 PM   LOS: 3 days

## 2017-02-24 LAB — MAGNESIUM: MAGNESIUM: 2 mg/dL (ref 1.7–2.4)

## 2017-02-24 LAB — BASIC METABOLIC PANEL
Anion gap: 10 (ref 5–15)
BUN: 24 mg/dL — ABNORMAL HIGH (ref 6–20)
CALCIUM: 8.9 mg/dL (ref 8.9–10.3)
CO2: 24 mmol/L (ref 22–32)
CREATININE: 0.88 mg/dL (ref 0.44–1.00)
Chloride: 102 mmol/L (ref 101–111)
GFR, EST NON AFRICAN AMERICAN: 59 mL/min — AB (ref >60)
Glucose, Bld: 95 mg/dL (ref 65–99)
Potassium: 3.6 mmol/L (ref 3.5–5.1)
SODIUM: 136 mmol/L (ref 135–145)

## 2017-02-24 LAB — BRAIN NATRIURETIC PEPTIDE: B NATRIURETIC PEPTIDE 5: 308 pg/mL — AB (ref 0.0–100.0)

## 2017-02-24 MED ORDER — DOCUSATE SODIUM 100 MG PO CAPS
100.0000 mg | ORAL_CAPSULE | Freq: Two times a day (BID) | ORAL | Status: DC
  Administered 2017-02-24 – 2017-02-28 (×7): 100 mg via ORAL
  Filled 2017-02-24 (×8): qty 1

## 2017-02-24 MED ORDER — BISACODYL 10 MG RE SUPP: 10.0000 mg | Freq: Once | RECTAL | Status: DC

## 2017-02-24 NOTE — Progress Notes (Signed)
PROGRESS NOTE  [name] FMB:[id number] DOB: 1930/07/04 DOA: 02/20/2017 PCP: Rory Percy, MD Brief History: 84 y.o.femalewith medical history ofparoxysmal atrial fibrillation, cardiomyopathy, hyperlipidemia, hypertension, depression presented with 3-day history of shortness of breath, increasing lower extremity edema, increasing abdominal girth, and orthopnea. Patient was recently admitted to the hospital from 02/01/17 through 02/05/2017.During that hospitalization, the patient was treated for atrial fibrillation with RVR. She underwent TEE DCCVon 02/04/2017.The patient endorses compliance with all her medications. She denies any fevers, chills, headache, dizziness, nausea, vomiting, diarrhea, abdominal pain, dysuria, hematuria. The patient had to sleep in a recliner on the evening of 02/19/2017 because of shortness of breath and orthopnea. In the emergency department, the patient was noted to have atrial fibrillation with HR 100-110.Chest x-ray showed increased interstitial prominence. CBC, BMP, and troponins were unremarkable. EKG showed atrial fibrillation with left bundle branch block. BNP was 465.0. The patient was started on intravenous furosemide. Cardiology was consulted to assist. Amiodarone drip was started.    Assessment/Plan: Acute on chronic systolic CHF -70/17/7939 echo EF 30-35% -ContinueIV furosemide 40 mg IV twice daily -NEG 6 lbs since admit -NEG 1.5L but question accuracy -likely tachycardia mediate -restarted losartan lower dose -Continue metoprolol succinate -am BNP 465>>>308  AtrialFibrillationwith RVR--persistent Afib -DespiteDCCV, the pt has reverted back to Afib -consultcardiology--appreciated -continueamiodarone drip (started 2/14) -remains in Afib -02/01/2017 TSH 1.783 -Continue rivaroxaban -Continue metoprolol succinate  Essential hypertension -restarted losartan lower dose -Continue metoprolol  succinate  Hyperlipidemia -Continue statin  Coronary artery disease -status post BMS to the RCA in5/2014.  -No active angina symptoms. -EKG unchanged  Hypokalemia -replete -check mag--1.9   Disposition Plan:Remain in SDU Family Communication:Family updatedat bedside 2/17   Consultants:cardiology  Code Status: FULL   DVT Prophylaxis:Xarelto   Procedures: As Listed in Progress Note Above  Antibiotics: None        Subjective: Patient denies fevers, chills, headache, chest pain, dyspnea, nausea, vomiting, diarrhea, abdominal pain, dysuria, hematuria, hematochezia, and melena.   Objective: Vitals:   02/24/17 0500 02/24/17 0600 02/24/17 0700 02/24/17 0805  BP: 95/76 104/68 (!) 123/99   Pulse: 69 89 84 82  Resp: 18 (!) 22 (!) 25 (!) 24  Temp:    98.6 F (37 C)  TempSrc:    Oral  SpO2: 95% 93% 94% 95%  Weight: 100.1 kg (220 lb 10.9 oz)     Height:        Intake/Output Summary (Last 24 hours) at 02/24/2017 1125 Last data filed at 02/24/2017 0300 Gross per 24 hour  Intake 586.54 ml  Output 625 ml  Net -38.46 ml   Weight change: 1 kg (2 lb 3.3 oz) Exam:   General:  Pt is alert, follows commands appropriately, not in acute distress  HEENT: No icterus, No thrush, No neck mass, [place]/AT  Cardiovascular: IRRR, S1/S2, no rubs, no gallops  Respiratory: bibasilar crackles, no wheeze  Abdomen: Soft/+BS, non tender, non distended, no guarding  Extremities: 1 + LE edema, No lymphangitis, No petechiae, No rashes, no synovitis   Data Reviewed: I have personally reviewed following labs and imaging studies Basic Metabolic Panel: Recent Labs  Lab 02/20/17 1253 02/21/17 0432 02/22/17 0434 02/23/17 0455 02/24/17 0506  NA 135 138 137 136 136  K 3.6 3.6 3.1* 3.6 3.6  CL 101 102 100* 102 102  CO2 21* 21* 25 22 24   GLUCOSE 110* 108* 91 101* 95  BUN 20 21* 21* 23* 24*  CREATININE 0.78 0.87  0.83 0.90 0.88  CALCIUM 9.1 9.4 9.0 9.1  8.9  MG  --   --   --  1.9 2.0   Liver Function Tests: No results for input(s): AST, ALT, ALKPHOS, BILITOT, PROT, ALBUMIN in the last 168 hours. No results for input(s): LIPASE, AMYLASE in the last 168 hours. No results for input(s): AMMONIA in the last 168 hours. Coagulation Profile: No results for input(s): INR, PROTIME in the last 168 hours. CBC: Recent Labs  Lab 02/20/17 1253  WBC 7.4  NEUTROABS 4.9  HGB 11.5*  HCT 36.6  MCV 82.6  PLT 331   Cardiac Enzymes: Recent Labs  Lab 02/20/17 1253 02/20/17 1704 02/21/17 0004  TROPONINI <0.03 <0.03 <0.03   BNP: Invalid input(s): POCBNP CBG: No results for input(s): GLUCAP in the last 168 hours. HbA1C: No results for input(s): HGBA1C in the last 72 hours. Urine analysis:    Component Value Date/Time   COLORURINE STRAW (A) 01/02/2017 1420   APPEARANCEUR CLEAR 01/02/2017 1420   LABSPEC 1.008 01/02/2017 1420   PHURINE 7.0 01/02/2017 1420   GLUCOSEU NEGATIVE 01/02/2017 1420   HGBUR NEGATIVE 01/02/2017 1420   BILIRUBINUR NEGATIVE 01/02/2017 1420   KETONESUR NEGATIVE 01/02/2017 1420   PROTEINUR NEGATIVE 01/02/2017 1420   NITRITE NEGATIVE 01/02/2017 1420   LEUKOCYTESUR NEGATIVE 01/02/2017 1420   Sepsis Labs: @LABRCNTIP (procalcitonin:4,lacticidven:4) ) Recent Results (from the past 240 hour(s))  MRSA PCR Screening     Status: None   Collection Time: 02/21/17 10:04 PM  Result Value Ref Range Status   MRSA by PCR NEGATIVE NEGATIVE Final    Comment:        The GeneXpert MRSA Assay (FDA approved for NASAL specimens only), is one component of a comprehensive MRSA colonization surveillance program. It is not intended to diagnose MRSA infection nor to guide or monitor treatment for MRSA infections. Performed at [facility], [address], [place], [place] [address]      Scheduled Meds: . aspirin EC  81 mg Oral Daily  . atorvastatin  20 mg Oral QHS  . furosemide  40 mg Intravenous BID  . losartan  12.5 mg Oral  Daily  . metoprolol succinate  50 mg Oral Daily  . potassium chloride SA  20 mEq Oral BID  . Rivaroxaban  15 mg Oral QAC supper  . sodium chloride flush  3 mL Intravenous Q12H   Continuous Infusions: . sodium chloride 250 mL (02/23/17 2148)  . amiodarone 30 mg/hr (02/24/17 0657)    Procedures/Studies: Dg Chest 2 View  Result Date: 02/01/2017 CLINICAL DATA:  Chest pain and shortness of breath EXAM: CHEST  2 VIEW COMPARISON:  January 21, 2017 FINDINGS: There is interstitial pulmonary edema. There is no airspace consolidation. There is pulmonary venous hypertension with cardiomegaly. There is no evident adenopathy. There is degenerative change in each shoulder. There is superior migration of the right humeral head. IMPRESSION: Interstitial edema with cardiomegaly and pulmonary venous hypertension. There is likely a degree of congestive heart failure. No consolidation. No adenopathy. Degenerative change in each shoulder with probable chronic rotator cuff tear on the right. Electronically Signed   By: Lowella Grip III M.D.   On: 02/01/2017 13:40   Dg Chest Port 1 View  Result Date: 02/20/2017 CLINICAL DATA:  Shortness of Breath, history of heart cath, hypertension EXAM: PORTABLE CHEST 1 VIEW COMPARISON:  02/01/2017 FINDINGS: Cardiomegaly. Mild peribronchial thickening and interstitial prominence. No confluent opacities or effusions. No acute bony abnormality. IMPRESSION: Cardiomegaly. Peribronchial thickening and interstitial prominence could  reflect bronchitis or interstitial edema. Electronically Signed   By: Rolm Baptise M.D.   On: 02/20/2017 13:28    Orson Eva, DO  [facility] Pager [phone]  If 7PM-7AM, please contact night-coverage [email or web address] Password [email or web address] 02/24/2017, 11:25 AM   LOS: 4 days

## 2017-02-25 ENCOUNTER — Inpatient Hospital Stay (HOSPITAL_COMMUNITY): Payer: Medicare Other

## 2017-02-25 DIAGNOSIS — I481 Persistent atrial fibrillation: Secondary | ICD-10-CM

## 2017-02-25 DIAGNOSIS — I10 Essential (primary) hypertension: Secondary | ICD-10-CM

## 2017-02-25 DIAGNOSIS — I2510 Atherosclerotic heart disease of native coronary artery without angina pectoris: Secondary | ICD-10-CM

## 2017-02-25 LAB — BASIC METABOLIC PANEL
Anion gap: 12 (ref 5–15)
BUN: 24 mg/dL — AB (ref 6–20)
CHLORIDE: 101 mmol/L (ref 101–111)
CO2: 25 mmol/L (ref 22–32)
CREATININE: 0.8 mg/dL (ref 0.44–1.00)
Calcium: 9.2 mg/dL (ref 8.9–10.3)
GFR calc Af Amer: >60 mL/min (ref >60)
GFR calc non Af Amer: >60 mL/min (ref >60)
Glucose, Bld: 94 mg/dL (ref 65–99)
Potassium: 3.5 mmol/L (ref 3.5–5.1)
Sodium: 138 mmol/L (ref 135–145)

## 2017-02-25 MED ORDER — SODIUM CHLORIDE 0.9% FLUSH: 3.0000 mL | INTRAVENOUS | Status: DC | PRN

## 2017-02-25 MED ORDER — SODIUM CHLORIDE 0.9 % IV SOLN: 250.0000 mL | INTRAVENOUS | Status: DC

## 2017-02-25 MED ORDER — SODIUM CHLORIDE 0.9% FLUSH
3.0000 mL | Freq: Two times a day (BID) | INTRAVENOUS | Status: DC
  Administered 2017-02-25 – 2017-02-28 (×5): 3 mL via INTRAVENOUS

## 2017-02-25 NOTE — H&P (View-Only) (Signed)
Progress Note  Patient Name: [name] Date of Encounter: 02/25/2017  Primary Cardiologist: Rozann Lesches, MD   Subjective   Reports her orthopnea has significantly improved and she was able to lay flat overnight. Still having palpitations and dyspnea with minimal activity.   Inpatient Medications    Scheduled Meds: . aspirin EC  81 mg Oral Daily  . atorvastatin  20 mg Oral QHS  . bisacodyl  10 mg Rectal Once  . docusate sodium  100 mg Oral BID  . furosemide  40 mg Intravenous BID  . losartan  12.5 mg Oral Daily  . metoprolol succinate  50 mg Oral Daily  . potassium chloride SA  20 mEq Oral BID  . Rivaroxaban  15 mg Oral QAC supper  . sodium chloride flush  3 mL Intravenous Q12H   Continuous Infusions: . sodium chloride 250 mL (02/24/17 2013)  . amiodarone 30 mg/hr (02/25/17 0915)   PRN Meds: sodium chloride, acetaminophen, ketotifen, ondansetron (ZOFRAN) IV, sodium chloride flush   Vital Signs    Vitals:   02/25/17 0400 02/25/17 0500 02/25/17 0600 02/25/17 0800  BP: (!) 84/70 111/71 113/68   Pulse: 60 71 91   Resp: 20 19 17    Temp: 98.1 F (36.7 C)   98.2 F (36.8 C)  TempSrc: Oral   Oral  SpO2: 94% 98% 92%   Weight:  218 lb 11.1 oz (99.2 kg)    Height:        Intake/Output Summary (Last 24 hours) at 02/25/2017 1034 Last data filed at 02/25/2017 0500 Gross per 24 hour  Intake 502 ml  Output 825 ml  Net -323 ml   Filed Weights   02/23/17 0400 02/24/17 0500 02/25/17 0500  Weight: 218 lb 7.6 oz (99.1 kg) 220 lb 10.9 oz (100.1 kg) 218 lb 11.1 oz (99.2 kg)    Telemetry    Atrial fibrillation, HR in 70's to 90's.  - Personally Reviewed  ECG    No new tracings.   Physical Exam   General: Well developed, elderly Caucasian female appearing in no acute distress. Head: Normocephalic, atraumatic.  Neck: Supple without bruits, JVD at 9cm. Lungs:  Resp regular and unlabored, mild rales along right base. Heart: Irregularly irregular, S1, S2, no  S3, S4, or murmur; no rub. Abdomen: Soft, non-tender, non-distended with normoactive bowel sounds. No hepatomegaly. No rebound/guarding. No obvious abdominal masses. Extremities: No clubbing, cyanosis, or lower extremity edema. Distal pedal pulses are 2+ bilaterally. Neuro: Alert and oriented X 3. Moves all extremities spontaneously. Psych: Normal affect.  Labs    Chemistry Recent Labs  Lab 02/23/17 0455 02/24/17 0506 02/25/17 0547  NA 136 136 138  K 3.6 3.6 3.5  CL 102 102 101  CO2 22 24 25   GLUCOSE 101* 95 94  BUN 23* 24* 24*  CREATININE 0.90 0.88 0.80  CALCIUM 9.1 8.9 9.2  GFRNONAA 57* 59* >60  GFRAA >60 >60 >60  ANIONGAP 12 10 12      Hematology Recent Labs  Lab 02/20/17 1253  WBC 7.4  RBC 4.43  HGB 11.5*  HCT 36.6  MCV 82.6  MCH 26.0  MCHC 31.4  RDW 14.9  PLT 331    Cardiac Enzymes Recent Labs  Lab 02/20/17 1253 02/20/17 1704 02/21/17 0004  TROPONINI <0.03 <0.03 <0.03   No results for input(s): TROPIPOC in the last 168 hours.   BNP Recent Labs  Lab 02/20/17 1253 02/24/17 0506  BNP 465.0* 308.0*     DDimer No  results for input(s): DDIMER in the last 168 hours.   Radiology    No results found.  Cardiac Studies   Echocardiogram: 01/03/2017 Study Conclusions  - Left ventricle: The cavity size was normal. Wall thickness was   normal. Systolic function was moderately to severely reduced. The   estimated ejection fraction was in the range of 30% to 35%. - Left atrium: The atrium was mildly dilated.  Impressions:  - Poor acoustic windows limit study Compared to report from 2015   LVEF is depressed.  Patient Profile     84 y.o. female  W/ PMH of CAD (s/p DES to RCA in 05/2012), chronic combined systolic and diastolic CHF (EF 16-10% by echo in 12/2016, 35-40% by TEE in 01/2017), persistent atrial fibrillation (on Xarelto, s/p DCCV on 02/05/2017), HTN, and HLD who presented to [facility] on 02/20/2017 for worsening dyspnea and found to be  back in atrial fibrillation with RVR.   Assessment & Plan    1. Acute on Chronic Systolic CHF - the patient has a known reduced EF of 30-35% by echo in 12/2016, thought to be possibly tachycardia-mediated in the setting of atrial fibrillation with RVR. - presented with worsening dyspnea on exertion, orthopnea, and abdominal distension with BNP elevated to 465. She has been started on IV Lasix 25m BID with recorded net output of -1.3L but weight has declined from 226 lbs to 218 lbs. Breathing has improved and no edema is appreciated on examination. Still having occasional desaturations into the mid-80's with coughing episodes. Will order repeat CXR. Kidney function remains stable and would continue with IV Lasix at this time with possible conversion to PO Lasix within the next 24-48 hours.   2. Persistent Atrial Fibrillation with RVR - s/p successful TEE-guided DCCV on 02/04/2017 [facility] found to be back in atrial fibrillation - started on IV Amiodarone on 02/21/2017 and rates have been stable in the 70's to 90's. Still symptomatic with her arrhythmia and is interested in a repeat DCCV. Will discuss with MD and anticipate repeat DCCV tomorrow following Amiodarone load.  - TEE on 02/04/2017 showed no evidence of intracardiac thrombus and she denies missing any doses of Xarelto. Continue with anticoagulation.   3. CAD - s/p DES to RCA in 05/2012. - she denies any recent chest discomfort. Has experienced dyspnea in the setting of atrial fibrillation and CHF. - continue BB and statin therapy. No ASA secondary to the need for anticoagulation. Will need to consider outpatient ischemic evaluation due to reduced EF.   4. HTN - BP has been variable at 83/57 - 119/88 within the past 24 hours.  - currently on Losartan 12.532mdaily and Toprol-XL 502maily. Will hold Losartan at this time with soft pressures.    For questions or updates, please contact  [place] consult [email or web address] for contact info under Cardiology/STEMI.   Signed, BriErma HeritagePA-C 10:34 AM 02/25/2017 Pager: [phone] seen aind examined   I agree with findings of B Strader above  Pt comfortable laying in bed Lungs are rel clear  Cardiac Irreg irreg   Ext with tr edeam  1.  Atrial fibrillation  Continue anticoagulation  Continue IV amiodarone  Plan for cardioversion tomorrow (TEE on 1/28 without thrombus) 2  CAD  No symtpoms of angina   3  Acute on chronic systolic CHF   I would reocmm continued IV diuresis   Strict I/O  Follow labs.  Dorris Carnes

## 2017-02-25 NOTE — Progress Notes (Signed)
Progress Note  Patient Name: [name] Date of Encounter: 02/25/2017  Primary Cardiologist: Rozann Lesches, MD   Subjective   Reports her orthopnea has significantly improved and she was able to lay flat overnight. Still having palpitations and dyspnea with minimal activity.   Inpatient Medications    Scheduled Meds: . aspirin EC  81 mg Oral Daily  . atorvastatin  20 mg Oral QHS  . bisacodyl  10 mg Rectal Once  . docusate sodium  100 mg Oral BID  . furosemide  40 mg Intravenous BID  . losartan  12.5 mg Oral Daily  . metoprolol succinate  50 mg Oral Daily  . potassium chloride SA  20 mEq Oral BID  . Rivaroxaban  15 mg Oral QAC supper  . sodium chloride flush  3 mL Intravenous Q12H   Continuous Infusions: . sodium chloride 250 mL (02/24/17 2013)  . amiodarone 30 mg/hr (02/25/17 0915)   PRN Meds: sodium chloride, acetaminophen, ketotifen, ondansetron (ZOFRAN) IV, sodium chloride flush   Vital Signs    Vitals:   02/25/17 0400 02/25/17 0500 02/25/17 0600 02/25/17 0800  BP: (!) 84/70 111/71 113/68   Pulse: 60 71 91   Resp: 20 19 17    Temp: 98.1 F (36.7 C)   98.2 F (36.8 C)  TempSrc: Oral   Oral  SpO2: 94% 98% 92%   Weight:  218 lb 11.1 oz (99.2 kg)    Height:        Intake/Output Summary (Last 24 hours) at 02/25/2017 1034 Last data filed at 02/25/2017 0500 Gross per 24 hour  Intake 502 ml  Output 825 ml  Net -323 ml   Filed Weights   02/23/17 0400 02/24/17 0500 02/25/17 0500  Weight: 218 lb 7.6 oz (99.1 kg) 220 lb 10.9 oz (100.1 kg) 218 lb 11.1 oz (99.2 kg)    Telemetry    Atrial fibrillation, HR in 70's to 90's.  - Personally Reviewed  ECG    No new tracings.   Physical Exam   General: Well developed, elderly Caucasian female appearing in no acute distress. Head: Normocephalic, atraumatic.  Neck: Supple without bruits, JVD at 9cm. Lungs:  Resp regular and unlabored, mild rales along right base. Heart: Irregularly irregular, S1, S2, no  S3, S4, or murmur; no rub. Abdomen: Soft, non-tender, non-distended with normoactive bowel sounds. No hepatomegaly. No rebound/guarding. No obvious abdominal masses. Extremities: No clubbing, cyanosis, or lower extremity edema. Distal pedal pulses are 2+ bilaterally. Neuro: Alert and oriented X 3. Moves all extremities spontaneously. Psych: Normal affect.  Labs    Chemistry Recent Labs  Lab 02/23/17 0455 02/24/17 0506 02/25/17 0547  NA 136 136 138  K 3.6 3.6 3.5  CL 102 102 101  CO2 22 24 25   GLUCOSE 101* 95 94  BUN 23* 24* 24*  CREATININE 0.90 0.88 0.80  CALCIUM 9.1 8.9 9.2  GFRNONAA 57* 59* >60  GFRAA >60 >60 >60  ANIONGAP 12 10 12      Hematology Recent Labs  Lab 02/20/17 1253  WBC 7.4  RBC 4.43  HGB 11.5*  HCT 36.6  MCV 82.6  MCH 26.0  MCHC 31.4  RDW 14.9  PLT 331    Cardiac Enzymes Recent Labs  Lab 02/20/17 1253 02/20/17 1704 02/21/17 0004  TROPONINI <0.03 <0.03 <0.03   No results for input(s): TROPIPOC in the last 168 hours.   BNP Recent Labs  Lab 02/20/17 1253 02/24/17 0506  BNP 465.0* 308.0*     DDimer No  results for input(s): DDIMER in the last 168 hours.   Radiology    No results found.  Cardiac Studies   Echocardiogram: 01/03/2017 Study Conclusions  - Left ventricle: The cavity size was normal. Wall thickness was   normal. Systolic function was moderately to severely reduced. The   estimated ejection fraction was in the range of 30% to 35%. - Left atrium: The atrium was mildly dilated.  Impressions:  - Poor acoustic windows limit study Compared to report from 2015   LVEF is depressed.  Patient Profile     84 y.o. female  W/ PMH of CAD (s/p DES to RCA in 05/2012), chronic combined systolic and diastolic CHF (EF 16-10% by echo in 12/2016, 35-40% by TEE in 01/2017), persistent atrial fibrillation (on Xarelto, s/p DCCV on 02/05/2017), HTN, and HLD who presented to [facility] on 02/20/2017 for worsening dyspnea and found to be  back in atrial fibrillation with RVR.   Assessment & Plan    1. Acute on Chronic Systolic CHF - the patient has a known reduced EF of 30-35% by echo in 12/2016, thought to be possibly tachycardia-mediated in the setting of atrial fibrillation with RVR. - presented with worsening dyspnea on exertion, orthopnea, and abdominal distension with BNP elevated to 465. She has been started on IV Lasix 38m BID with recorded net output of -1.3L but weight has declined from 226 lbs to 218 lbs. Breathing has improved and no edema is appreciated on examination. Still having occasional desaturations into the mid-80's with coughing episodes. Will order repeat CXR. Kidney function remains stable and would continue with IV Lasix at this time with possible conversion to PO Lasix within the next 24-48 hours.   2. Persistent Atrial Fibrillation with RVR - s/p successful TEE-guided DCCV on 02/04/2017 [facility] and found to be back in atrial fibrillation - started on IV Amiodarone on 02/21/2017 and rates have been stable in the 70's to 90's. Still symptomatic with her arrhythmia and is interested in a repeat DCCV. Will discuss with MD and anticipate repeat DCCV tomorrow following Amiodarone load.  - TEE on 02/04/2017 showed no evidence of intracardiac thrombus and she denies missing any doses of Xarelto. Continue with anticoagulation.   3. CAD - s/p DES to RCA in 05/2012. - she denies any recent chest discomfort. Has experienced dyspnea in the setting of atrial fibrillation and CHF. - continue BB and statin therapy. No ASA secondary to the need for anticoagulation. Will need to consider outpatient ischemic evaluation due to reduced EF.   4. HTN - BP has been variable at 83/57 - 119/88 within the past 24 hours.  - currently on Losartan 12.547mdaily and Toprol-XL 5041maily. Will hold Losartan at this time with soft pressures.    For questions or updates, please contact  [facility] consult [email or web address] for contact info under Cardiology/STEMI.   Signed, BriErma HeritagePA-C 10:34 AM 02/25/2017 Pager: [phone] seen aind examined   I agree with findings of B Strader above  Pt comfortable laying in bed Lungs are rel clear  Cardiac Irreg irreg   Ext with tr edeam  1.  Atrial fibrillation  Continue anticoagulation  Continue IV amiodarone  Plan for cardioversion tomorrow (TEE on 1/28 without thrombus) 2  CAD  No symtpoms of angina   3  Acute on chronic systolic CHF   I would reocmm continued IV diuresis   Strict I/O  Follow labs.  Dorris Carnes

## 2017-02-25 NOTE — Progress Notes (Signed)
PROGRESS NOTE  [name] OZD:[id number] DOB: 1930-06-11 DOA: 02/20/2017 PCP: Rory Percy, MD  Brief History: 84 y.o.femalewith medical history ofparoxysmal atrial fibrillation, cardiomyopathy, hyperlipidemia, hypertension, depression presented with 3-day history of shortness of breath, increasing lower extremity edema, increasing abdominal girth, and orthopnea. Patient was recently admitted to the hospital from 02/01/17 through 02/05/2017.During that hospitalization, the patient was treated for atrial fibrillation with RVR. She underwent TEE DCCVon 02/04/2017.The patient endorses compliance with all her medications. She denies any fevers, chills, headache, dizziness, nausea, vomiting, diarrhea, abdominal pain, dysuria, hematuria. The patient had to sleep in a recliner on the evening of 02/19/2017 because of shortness of breath and orthopnea. In the emergency department, the patient was noted to have atrial fibrillation with HR 100-110.Chest x-ray showed increased interstitial prominence. CBC, BMP, and troponins were unremarkable. EKG showed atrial fibrillation with left bundle branch block. BNP was 465.0. The patient was started on intravenous furosemide. Cardiology was consulted to assist. Amiodarone drip was started.    Assessment/Plan: Acute on chronic systolic CHF -25/95/6387 echo EF 30-35% -ContinueIV furosemide 40 mg IV twice daily -NEG 8 lbs since admit -NEG 1.5L but question accuracy -likely tachycardia mediate -d/c losartan due to soft BP -Continue metoprolol succinate -am BNP 465>>>308 -oxygen saturation 100% on 2L  AtrialFibrillationwith RVR--persistent Afib -DespiteDCCV, the pt has reverted back to Afib -consultcardiology--appreciated -continueamiodarone drip (started 2/14) -remains in Afib -02/01/2017 TSH 1.783 -Continue rivaroxaban -Continue metoprolol succinate -02/26/17--planning for repeat DCCV  Essential  hypertension -restartedlosartan lower dose -Continue metoprolol succinate  Hyperlipidemia -Continue statin  Coronary artery disease -status post BMS to the RCA in5/2014.  -No active angina symptoms. -EKG unchanged  Hypokalemia -replete -check mag--1.9   Disposition Plan:Remain in SDU Family Communication:Daughter updatedat bedside 2/18   Consultants:cardiology  Code Status: FULL   DVT Prophylaxis:Xarelto   Procedures: As Listed in Progress Note Above  Antibiotics: None     Subjective: Patient denies fevers, chills, headache, chest pain, dyspnea, nausea, vomiting, diarrhea, abdominal pain, dysuria, hematuria, hematochezia, and melena.  She is able to lie flat   Objective: Vitals:   02/25/17 0600 02/25/17 0800 02/25/17 1118 02/25/17 1239  BP: 113/68  96/60   Pulse: 91  91   Resp: 17     Temp:  98.2 F (36.8 C)  98.4 F (36.9 C)  TempSrc:  Oral  Oral  SpO2: 92%     Weight:      Height:        Intake/Output Summary (Last 24 hours) at 02/25/2017 1735 Last data filed at 02/25/2017 0500 Gross per 24 hour  Intake 100 ml  Output 700 ml  Net -600 ml   Weight change: -0.9 kg (-15.8 oz) Exam:   General:  Pt is alert, follows commands appropriately, not in acute distress  HEENT: No icterus, No thrush, No neck mass, [place]/AT  Cardiovascular: IRRR, S1/S2, no rubs, no gallops  Respiratory: Fine bibasilar crackles.  No wheezing.  Good air movement.  Abdomen: Soft/+BS, non tender, non distended, no guarding  Extremities: No edema, No lymphangitis, No petechiae, No rashes, no synovitis   Data Reviewed: I have personally reviewed following labs and imaging studies Basic Metabolic Panel: Recent Labs  Lab 02/21/17 0432 02/22/17 0434 02/23/17 0455 02/24/17 0506 02/25/17 0547  NA 138 137 136 136 138  K 3.6 3.1* 3.6 3.6 3.5  CL 102 100* 102 102 101  CO2 21* 25 22 24 25   GLUCOSE 108* 91 101* 95 94  BUN 21* 21* 23* 24* 24*   CREATININE 0.87 0.83 0.90 0.88 0.80  CALCIUM 9.4 9.0 9.1 8.9 9.2  MG  --   --  1.9 2.0  --    Liver Function Tests: No results for input(s): AST, ALT, ALKPHOS, BILITOT, PROT, ALBUMIN in the last 168 hours. No results for input(s): LIPASE, AMYLASE in the last 168 hours. No results for input(s): AMMONIA in the last 168 hours. Coagulation Profile: No results for input(s): INR, PROTIME in the last 168 hours. CBC: Recent Labs  Lab 02/20/17 1253  WBC 7.4  NEUTROABS 4.9  HGB 11.5*  HCT 36.6  MCV 82.6  PLT 331   Cardiac Enzymes: Recent Labs  Lab 02/20/17 1253 02/20/17 1704 02/21/17 0004  TROPONINI <0.03 <0.03 <0.03   BNP: Invalid input(s): POCBNP CBG: No results for input(s): GLUCAP in the last 168 hours. HbA1C: No results for input(s): HGBA1C in the last 72 hours. Urine analysis:    Component Value Date/Time   COLORURINE STRAW (A) 01/02/2017 1420   APPEARANCEUR CLEAR 01/02/2017 1420   LABSPEC 1.008 01/02/2017 1420   PHURINE 7.0 01/02/2017 1420   GLUCOSEU NEGATIVE 01/02/2017 1420   HGBUR NEGATIVE 01/02/2017 1420   BILIRUBINUR NEGATIVE 01/02/2017 1420   KETONESUR NEGATIVE 01/02/2017 1420   PROTEINUR NEGATIVE 01/02/2017 1420   NITRITE NEGATIVE 01/02/2017 1420   LEUKOCYTESUR NEGATIVE 01/02/2017 1420   Sepsis Labs: @LABRCNTIP (procalcitonin:4,lacticidven:4) ) Recent Results (from the past 240 hour(s))  MRSA PCR Screening     Status: None   Collection Time: 02/21/17 10:04 PM  Result Value Ref Range Status   MRSA by PCR NEGATIVE NEGATIVE Final    Comment:        The GeneXpert MRSA Assay (FDA approved for NASAL specimens only), is one component of a comprehensive MRSA colonization surveillance program. It is not intended to diagnose MRSA infection nor to guide or monitor treatment for MRSA infections. Performed at [facility], [address], [place], [place] [address]      Scheduled Meds: . aspirin EC  81 mg Oral Daily  . atorvastatin  20 mg Oral QHS  .  bisacodyl  10 mg Rectal Once  . docusate sodium  100 mg Oral BID  . furosemide  40 mg Intravenous BID  . metoprolol succinate  50 mg Oral Daily  . potassium chloride SA  20 mEq Oral BID  . Rivaroxaban  15 mg Oral QAC supper  . sodium chloride flush  3 mL Intravenous Q12H  . sodium chloride flush  3 mL Intravenous Q12H   Continuous Infusions: . sodium chloride 250 mL (02/24/17 2013)  . sodium chloride    . amiodarone 30 mg/hr (02/25/17 0915)    Procedures/Studies: Dg Chest 2 View  Result Date: 02/01/2017 CLINICAL DATA:  Chest pain and shortness of breath EXAM: CHEST  2 VIEW COMPARISON:  January 21, 2017 FINDINGS: There is interstitial pulmonary edema. There is no airspace consolidation. There is pulmonary venous hypertension with cardiomegaly. There is no evident adenopathy. There is degenerative change in each shoulder. There is superior migration of the right humeral head. IMPRESSION: Interstitial edema with cardiomegaly and pulmonary venous hypertension. There is likely a degree of congestive heart failure. No consolidation. No adenopathy. Degenerative change in each shoulder with probable chronic rotator cuff tear on the right. Electronically Signed   By: Lowella Grip III M.D.   On: 02/01/2017 13:40   Dg Chest Port 1 View  Result Date: 02/25/2017 CLINICAL DATA:  84 year old female admitted with shortness of  breath, recurrent atrial fibrillation with RVR, acute on chronic systolic CHF. EXAM: PORTABLE CHEST 1 VIEW COMPARISON:  02/20/2017 and earlier. FINDINGS: Portable AP upright view at 1229 hrs. Stable cardiomegaly and mediastinal contours. Decreased bilateral pulmonary vascularity, now appearing near baseline. No pneumothorax, pleural effusion or confluent pulmonary opacity. Visualized tracheal air column is within normal limits. Advanced degenerative changes of the shoulders. IMPRESSION: 1. Resolved pulmonary interstitial edema. 2. No new cardiopulmonary abnormality. Electronically  Signed   By: Genevie Ann M.D.   On: 02/25/2017 12:37   Dg Chest Port 1 View  Result Date: 02/20/2017 CLINICAL DATA:  Shortness of Breath, history of heart cath, hypertension EXAM: PORTABLE CHEST 1 VIEW COMPARISON:  02/01/2017 FINDINGS: Cardiomegaly. Mild peribronchial thickening and interstitial prominence. No confluent opacities or effusions. No acute bony abnormality. IMPRESSION: Cardiomegaly. Peribronchial thickening and interstitial prominence could reflect bronchitis or interstitial edema. Electronically Signed   By: Rolm Baptise M.D.   On: 02/20/2017 13:28    Orson Eva, DO  [facility] Pager [phone]  If 7PM-7AM, please contact night-coverage [email or web address] Password [email or web address] 02/25/2017, 5:35 PM   LOS: 5 days

## 2017-02-26 ENCOUNTER — Encounter (HOSPITAL_COMMUNITY)
Admission: EM | Disposition: A | Discharge status: Skilled Nursing Facility | Payer: Self-pay | Source: Home / Self Care | Attending: Internal Medicine

## 2017-02-26 ENCOUNTER — Inpatient Hospital Stay (HOSPITAL_COMMUNITY): Payer: Medicare Other | Admitting: Anesthesiology

## 2017-02-26 ENCOUNTER — Encounter (HOSPITAL_COMMUNITY): Payer: Self-pay | Admitting: Cardiology

## 2017-02-26 DIAGNOSIS — I25119 Atherosclerotic heart disease of native coronary artery with unspecified angina pectoris: Secondary | ICD-10-CM

## 2017-02-26 HISTORY — PX: CARDIOVERSION: SHX1299

## 2017-02-26 LAB — BASIC METABOLIC PANEL
Anion gap: 12 (ref 5–15)
BUN: 24 mg/dL — ABNORMAL HIGH (ref 6–20)
CALCIUM: 9.1 mg/dL (ref 8.9–10.3)
CHLORIDE: 101 mmol/L (ref 101–111)
CO2: 25 mmol/L (ref 22–32)
CREATININE: 0.8 mg/dL (ref 0.44–1.00)
Glucose, Bld: 92 mg/dL (ref 65–99)
Potassium: 3.5 mmol/L (ref 3.5–5.1)
SODIUM: 138 mmol/L (ref 135–145)

## 2017-02-26 LAB — MAGNESIUM: MAGNESIUM: 1.8 mg/dL (ref 1.7–2.4)

## 2017-02-26 SURGERY — CARDIOVERSION
Anesthesia: Monitor Anesthesia Care

## 2017-02-26 MED ORDER — PROPOFOL 10 MG/ML IV BOLUS
INTRAVENOUS | Status: DC | PRN
  Administered 2017-02-26: 10 mg via INTRAVENOUS

## 2017-02-26 MED ORDER — AMIODARONE HCL 200 MG PO TABS
200.0000 mg | ORAL_TABLET | Freq: Two times a day (BID) | ORAL | Status: DC
  Administered 2017-02-26 – 2017-02-28 (×5): 200 mg via ORAL
  Filled 2017-02-26 (×5): qty 1

## 2017-02-26 MED ORDER — LACTATED RINGERS IV SOLN
INTRAVENOUS | Status: DC
  Administered 2017-02-26: 08:00:00 via INTRAVENOUS

## 2017-02-26 MED ORDER — PROPOFOL 500 MG/50ML IV EMUL
INTRAVENOUS | Status: DC | PRN
  Administered 2017-02-26: 75 ug/kg/min via INTRAVENOUS

## 2017-02-26 NOTE — Anesthesia Postprocedure Evaluation (Signed)
Anesthesia Post Note  Patient: [name]  Procedure(s) Performed: CARDIOVERSION (N/A )  Patient location during evaluation: PACU Anesthesia Type: MAC Level of consciousness: awake and alert and patient cooperative Pain management: satisfactory to patient Vital Signs Assessment: post-procedure vital signs reviewed and stable Respiratory status: spontaneous breathing and patient connected to nasal cannula oxygen Cardiovascular status: stable Postop Assessment: no apparent nausea or vomiting Anesthetic complications: no     Last Vitals:  Vitals:   02/26/17 0910 02/26/17 0915  BP: 102/64 100/69  Pulse: (!) 54 (!) 53  Resp: 16 (!) 27  Temp: 36.9 C   SpO2: 100% 100%    Last Pain:  Vitals:   02/26/17 0354  TempSrc: Oral  PainSc:                  Drucie Opitz

## 2017-02-26 NOTE — Progress Notes (Signed)
PROGRESS NOTE  [name] UQJ:[id number] DOB: 07/11/1930 DOA: 02/20/2017 PCP: Rory Percy, MD  Brief History: 84 y.o.femalewith medical history ofparoxysmal atrial fibrillation, cardiomyopathy, hyperlipidemia, hypertension, depression presented with 3-day history of shortness of breath, increasing lower extremity edema, increasing abdominal girth, and orthopnea. Patient was recently admitted to the hospital from 02/01/17 through 02/05/2017.During that hospitalization, the patient was treated for atrial fibrillation with RVR. She underwent TEE DCCVon 02/04/2017.The patient endorses compliance with all her medications. She denies any fevers, chills, headache, dizziness, nausea, vomiting, diarrhea, abdominal pain, dysuria, hematuria. The patient had to sleep in a recliner on the evening of 02/19/2017 because of shortness of breath and orthopnea. In the emergency department, the patient was noted to have atrial fibrillation with HR 100-110.Chest x-ray showed increased interstitial prominence. CBC, BMP, and troponins were unremarkable. EKG showed atrial fibrillation with left bundle branch block. BNP was 465.0. The patient was started on intravenous furosemide. Cardiology was consulted to assist. Amiodarone drip was started.    Assessment/Plan: Acute on chronic systolic CHF -38/93/7342 echo EF 30-35% -ContinueIV furosemide 40 mg IV twice daily -NEG7lbs since admit -NEG 1.8Lbut question accuracy -likely tachycardia mediate -d/c losartan due to soft BP -Continue metoprolol succinate -am BNP465>>>308 -oxygen saturation 100% on 2L>>>wean to RA  AtrialFibrillationwith RVR--persistent Afib -DespiteDCCV, the pt has reverted back to Afib -consultcardiology--appreciated -continueamiodarone drip (started 2/14) -02/01/2017 TSH 1.783 -Continue rivaroxaban -Continue metoprolol succinate -02/26/17--DCCV-->sinus -02/26/17--converted to po  amiodarone  Essential hypertension -Holding losartan secondary to soft blood pressure -Continue metoprolol succinate  Hyperlipidemia -Continue statin  Coronary artery disease -status post BMS to the RCA in5/2014.  -No active angina symptoms. -EKG unchanged  Hypokalemia -replete -check mag--1.9   Disposition Plan:Hopeful home 2/20 if cleared by cards Family Communication:Daughterupdatedat bedside 02/26/17   Consultants:cardiology  Code Status: FULL   DVT Prophylaxis:Xarelto   Procedures: As Listed in Progress Note Above  Antibiotics: None       Subjective: Patient denies fevers, chills, headache, chest pain, dyspnea, nausea, vomiting, diarrhea, abdominal pain, dysuria, hematuria, hematochezia, and melena.   Objective: Vitals:   02/26/17 0930 02/26/17 0945 02/26/17 1000 02/26/17 1015  BP: 102/68 104/71 104/73 107/62  Pulse: (!) 54 (!) 57 (!) 56 (!) 56  Resp: 18 (!) 22 20 (!) 21  Temp:      TempSrc:      SpO2: 100% 99% 97% 100%  Weight:      Height:        Intake/Output Summary (Last 24 hours) at 02/26/2017 1121 Last data filed at 02/26/2017 0903 Gross per 24 hour  Intake 50 ml  Output 550 ml  Net -500 ml   Weight change: 0.2 kg (7.1 oz) Exam:   General:  Pt is alert, follows commands appropriately, not in acute distress  HEENT: No icterus, No thrush, No neck mass, [place]/AT  Cardiovascular: RRR, S1/S2, no rubs, no gallops  Respiratory: Fine bibasilar crackles.  No wheeze.  Good air movement  Abdomen: Soft/+BS, non tender, non distended, no guarding  Extremities: 1 + LE edema, No lymphangitis, No petechiae, No rashes, no synovitis   Data Reviewed: I have personally reviewed following labs and imaging studies Basic Metabolic Panel: Recent Labs  Lab 02/22/17 0434 02/23/17 0455 02/24/17 0506 02/25/17 0547 02/26/17 0427  NA 137 136 136 138 138  K 3.1* 3.6 3.6 3.5 3.5  CL 100* 102 102 101 101  CO2 25 22 24 25  25   GLUCOSE 91 101* 95 94  92  BUN 21* 23* 24* 24* 24*  CREATININE 0.83 0.90 0.88 0.80 0.80  CALCIUM 9.0 9.1 8.9 9.2 9.1  MG  --  1.9 2.0  --  1.8   Liver Function Tests: No results for input(s): AST, ALT, ALKPHOS, BILITOT, PROT, ALBUMIN in the last 168 hours. No results for input(s): LIPASE, AMYLASE in the last 168 hours. No results for input(s): AMMONIA in the last 168 hours. Coagulation Profile: No results for input(s): INR, PROTIME in the last 168 hours. CBC: Recent Labs  Lab 02/20/17 1253  WBC 7.4  NEUTROABS 4.9  HGB 11.5*  HCT 36.6  MCV 82.6  PLT 331   Cardiac Enzymes: Recent Labs  Lab 02/20/17 1253 02/20/17 1704 02/21/17 0004  TROPONINI <0.03 <0.03 <0.03   BNP: Invalid input(s): POCBNP CBG: No results for input(s): GLUCAP in the last 168 hours. HbA1C: No results for input(s): HGBA1C in the last 72 hours. Urine analysis:    Component Value Date/Time   COLORURINE STRAW (A) 01/02/2017 1420   APPEARANCEUR CLEAR 01/02/2017 1420   LABSPEC 1.008 01/02/2017 1420   PHURINE 7.0 01/02/2017 1420   GLUCOSEU NEGATIVE 01/02/2017 1420   HGBUR NEGATIVE 01/02/2017 1420   BILIRUBINUR NEGATIVE 01/02/2017 1420   KETONESUR NEGATIVE 01/02/2017 1420   PROTEINUR NEGATIVE 01/02/2017 1420   NITRITE NEGATIVE 01/02/2017 1420   LEUKOCYTESUR NEGATIVE 01/02/2017 1420   Sepsis Labs: @LABRCNTIP (procalcitonin:4,lacticidven:4) ) Recent Results (from the past 240 hour(s))  MRSA PCR Screening     Status: None   Collection Time: 02/21/17 10:04 PM  Result Value Ref Range Status   MRSA by PCR NEGATIVE NEGATIVE Final    Comment:        The GeneXpert MRSA Assay (FDA approved for NASAL specimens only), is one component of a comprehensive MRSA colonization surveillance program. It is not intended to diagnose MRSA infection nor to guide or monitor treatment for MRSA infections. Performed at [facility], [address], [place], [place] [address]      Scheduled Meds: . amiodarone   200 mg Oral BID  . aspirin EC  81 mg Oral Daily  . atorvastatin  20 mg Oral QHS  . bisacodyl  10 mg Rectal Once  . docusate sodium  100 mg Oral BID  . furosemide  40 mg Intravenous BID  . metoprolol succinate  50 mg Oral Daily  . potassium chloride SA  20 mEq Oral BID  . Rivaroxaban  15 mg Oral QAC supper  . sodium chloride flush  3 mL Intravenous Q12H  . sodium chloride flush  3 mL Intravenous Q12H   Continuous Infusions: . sodium chloride 250 mL (02/24/17 2013)  . sodium chloride      Procedures/Studies: Dg Chest 2 View  Result Date: 02/01/2017 CLINICAL DATA:  Chest pain and shortness of breath EXAM: CHEST  2 VIEW COMPARISON:  January 21, 2017 FINDINGS: There is interstitial pulmonary edema. There is no airspace consolidation. There is pulmonary venous hypertension with cardiomegaly. There is no evident adenopathy. There is degenerative change in each shoulder. There is superior migration of the right humeral head. IMPRESSION: Interstitial edema with cardiomegaly and pulmonary venous hypertension. There is likely a degree of congestive heart failure. No consolidation. No adenopathy. Degenerative change in each shoulder with probable chronic rotator cuff tear on the right. Electronically Signed   By: Lowella Grip III M.D.   On: 02/01/2017 13:40   Dg Chest Port 1 View  Result Date: 02/25/2017 CLINICAL DATA:  84 year old female admitted with shortness of  breath, recurrent atrial fibrillation with RVR, acute on chronic systolic CHF. EXAM: PORTABLE CHEST 1 VIEW COMPARISON:  02/20/2017 and earlier. FINDINGS: Portable AP upright view at 1229 hrs. Stable cardiomegaly and mediastinal contours. Decreased bilateral pulmonary vascularity, now appearing near baseline. No pneumothorax, pleural effusion or confluent pulmonary opacity. Visualized tracheal air column is within normal limits. Advanced degenerative changes of the shoulders. IMPRESSION: 1. Resolved pulmonary interstitial edema. 2. No new  cardiopulmonary abnormality. Electronically Signed   By: Genevie Ann M.D.   On: 02/25/2017 12:37   Dg Chest Port 1 View  Result Date: 02/20/2017 CLINICAL DATA:  Shortness of Breath, history of heart cath, hypertension EXAM: PORTABLE CHEST 1 VIEW COMPARISON:  02/01/2017 FINDINGS: Cardiomegaly. Mild peribronchial thickening and interstitial prominence. No confluent opacities or effusions. No acute bony abnormality. IMPRESSION: Cardiomegaly. Peribronchial thickening and interstitial prominence could reflect bronchitis or interstitial edema. Electronically Signed   By: Rolm Baptise M.D.   On: 02/20/2017 13:28    Orson Eva, DO  [facility] Pager [phone]  If 7PM-7AM, please contact night-coverage [email or web address] Password [email or web address] 02/26/2017, 11:21 AM   LOS: 6 days

## 2017-02-26 NOTE — CV Procedure (Signed)
Elective direct-current cardioversion  Indication: Recurrent, persistent atrial fibrillation  Description of procedure: Informed consent was obtained.  Patient taken to the PACU where a timeout was performed.  Anterior and posterior pads were placed in standard fashion and connected to a biphasic defibrillator.  Sedation was provided by the Anesthesia service with use of propofol.  Sandbag was placed on anterior chest pad.  A single, synchronized shock at 120 J was initially delivered with atrial fibrillation persisting.  A second, synchronized shock at 150 J was then delivered with successful restoration of sinus bradycardia.  Patient tolerated the procedure well without immediate complications.  Satira Sark, M.D., F.A.C.C.

## 2017-02-26 NOTE — Care Management Note (Signed)
Case Management Note  Patient Details  Name: [name] MRN: [id number] Date of Birth: 10/07/1930  If discussed at Long Length of Stay Meetings, dates discussed:  02/26/2017  Additional Comments:  Sherald Barge, RN 02/26/2017, 12:31 PM

## 2017-02-26 NOTE — Progress Notes (Signed)
Electrical Cardioversion Procedure Note [name] [id number] 06/14/1930  Procedure: Electrical Cardioversion Indications:Atrial Fib  Procedure Details Consent: yes Time Out: Verified patient identification, verified procedure, site/side was marked, verified correct patient position, special equipment/implants available, medications/allergies/relevent history reviewed, required imaging and test results available. Time out [id number] Patient placed on cardiac monitor, pulse oximetry, supplemental oxygen as necessary.  Sedation given: yes Pacer pads placed yes  Cardioverted 2 times Cardioverted at 120 and 150  Evaluation Findings: Post procedure EKG shows: sinus  Bradycardia Complications: none Patient did tolerate procedure well.   Hillery Jacks 02/26/2017, 9:23 AM

## 2017-02-26 NOTE — Interval H&P Note (Signed)
History and Physical Interval Note:  02/26/2017 8:39 AM  Patient presents this morning for DCCV on Amiodarone for management of recurrent symptomatic atrial fibrillation. She had a recent TEE DCCV in late January that was initially successful. Heart failure symptoms have improved. Lab work reviewed. She reports compliance with Xarelto since the TEE DCCV in January at which point there was no LAA thrombus. She states that she is ready to proceed, informed consent obtained.  Satira Sark, M.D., F.A.C.C.

## 2017-02-26 NOTE — Progress Notes (Signed)
Progress Note  Patient Name: [name] Date of Encounter: 02/26/2017  Primary Cardiologist: Dr. Satira Sark  Subjective   Patient reports improved shortness of breath, no chest pain prior to cardioversion this morning which she tolerated.  Still weak, difficulty ambulating.  Inpatient Medications    Scheduled Meds: . [MAR Hold] aspirin EC  81 mg Oral Daily  . [MAR Hold] atorvastatin  20 mg Oral QHS  . [MAR Hold] bisacodyl  10 mg Rectal Once  . [MAR Hold] docusate sodium  100 mg Oral BID  . [MAR Hold] furosemide  40 mg Intravenous BID  . [MAR Hold] metoprolol succinate  50 mg Oral Daily  . [MAR Hold] potassium chloride SA  20 mEq Oral BID  . [MAR Hold] Rivaroxaban  15 mg Oral QAC supper  . [MAR Hold] sodium chloride flush  3 mL Intravenous Q12H  . [MAR Hold] sodium chloride flush  3 mL Intravenous Q12H   Continuous Infusions: . [MAR Hold] sodium chloride 250 mL (02/24/17 2013)  . sodium chloride    . amiodarone 30 mg/hr (02/25/17 2139)  . lactated ringers     PRN Meds: [MAR Hold] sodium chloride, [MAR Hold] acetaminophen, [MAR Hold] ketotifen, [MAR Hold] ondansetron (ZOFRAN) IV, [MAR Hold] sodium chloride flush, [MAR Hold] sodium chloride flush   Vital Signs    Vitals:   02/26/17 0815 02/26/17 0830 02/26/17 0910 02/26/17 0915  BP: 113/89 110/90 102/64 100/69  Pulse: 74 65 (!) 54 (!) 53  Resp: 17 14 16  (!) 27  Temp:   98.4 F (36.9 C)   TempSrc:      SpO2: 97% 100% 100% 100%  Weight:      Height:        Intake/Output Summary (Last 24 hours) at 02/26/2017 0947 Last data filed at 02/26/2017 0903 Gross per 24 hour  Intake 50 ml  Output 550 ml  Net -500 ml   Filed Weights   02/24/17 0500 02/25/17 0500 02/26/17 0354  Weight: 220 lb 10.9 oz (100.1 kg) 218 lb 11.1 oz (99.2 kg) 219 lb 2.2 oz (99.4 kg)    Telemetry    Persistent atrial fibrillation prior to cardioversion. personally reviewed.  ECG    Post cardioversion tracing showed sinus  bradycardia with prolonged PR interval and left bundle branch block.  Personally reviewed.  Physical Exam   GEN:  Elderly woman.  No acute distress.   Neck: No JVD. Cardiac:  Irregularly irregular, no gallop.  Respiratory: Nonlabored. Clear to auscultation bilaterally. GI: Soft, nontender, bowel sounds present. MS:  Trace ankle; No deformity. Neuro:  Nonfocal. Psych: Alert and oriented x 3. Normal affect.  Labs    Chemistry Recent Labs  Lab 02/24/17 0506 02/25/17 0547 02/26/17 0427  NA 136 138 138  K 3.6 3.5 3.5  CL 102 101 101  CO2 24 25 25   GLUCOSE 95 94 92  BUN 24* 24* 24*  CREATININE 0.88 0.80 0.80  CALCIUM 8.9 9.2 9.1  GFRNONAA 59* >60 >60  GFRAA >60 >60 >60  ANIONGAP 10 12 12      Hematology Recent Labs  Lab 02/20/17 1253  WBC 7.4  RBC 4.43  HGB 11.5*  HCT 36.6  MCV 82.6  MCH 26.0  MCHC 31.4  RDW 14.9  PLT 331    Cardiac Enzymes Recent Labs  Lab 02/20/17 1253 02/20/17 1704 02/21/17 0004  TROPONINI <0.03 <0.03 <0.03   No results for input(s): TROPIPOC in the last 168 hours.   BNP Recent Labs  Lab 02/20/17 1253 02/24/17 0506  BNP 465.0* 308.0*     Radiology    Dg Chest Port 1 View  Result Date: 02/25/2017 CLINICAL DATA:  84 year old female admitted with shortness of breath, recurrent atrial fibrillation with RVR, acute on chronic systolic CHF. EXAM: PORTABLE CHEST 1 VIEW COMPARISON:  02/20/2017 and earlier. FINDINGS: Portable AP upright view at 1229 hrs. Stable cardiomegaly and mediastinal contours. Decreased bilateral pulmonary vascularity, now appearing near baseline. No pneumothorax, pleural effusion or confluent pulmonary opacity. Visualized tracheal air column is within normal limits. Advanced degenerative changes of the shoulders. IMPRESSION: 1. Resolved pulmonary interstitial edema. 2. No new cardiopulmonary abnormality. Electronically Signed   By: Genevie Ann M.D.   On: 02/25/2017 12:37    Cardiac Studies   TEE 02/04/2017: Study  Conclusions  - Left ventricle: The cavity size was normal. Systolic function was   normal. The estimated ejection fraction was in the range of 35%   to 40%. No evidence of thrombus. - Mitral valve: There was mild regurgitation. - Left atrium: The atrium was moderately dilated. No evidence of   thrombus in the atrial cavity or appendage. Normal LAA emptying   velocity of 68 cm/s. - Right atrium: No evidence of thrombus in the atrial cavity or   appendage.  Patient Profile     84 y.o. female with a history of CAD status post BMS to the RCA in 2014, hypertension, paroxysmal to persistent atrial fibrillation with possible tachycardia-mediated cardiomyopathy and LVEF approximately 35%.  She presents with acute on chronic systolic heart failure in the setting of recurrent rapid atrial fibrillation.  Assessment & Plan    1.  Persistent atrial fibrillation, presenting with RVR.  Patient has been continued on Xarelto with initiation of IV amiodarone and underwent successful DCCV this morning. CHADSVASC score is 6.  2.  Secondary cardiomyopathy, possibly tachycardia-mediated with LVEF approximately 35%.  3.  Acute on chronic systolic heart failure complicated by recurring atrial fibrillation.  4.  CAD status post BMS to the RCA in 2014.  No active angina symptoms.  Troponin I negative.  5.  Essential hypertension, blood pressure is stable.  Discussed with patient and also family members present following cardioversion.  General plan is to see how she does in terms of maintaining sinus rhythm on amiodarone.  We will switch to oral dosing and otherwise continue Toprol-XL as well as IV Lasix and potassium supplements through today.  No initiation of ARB at this time with low normal blood pressure.  She also looks to be deconditioned and may need further PT versus rehabilitation.  We discussed potential referral to EP if recurrent atrial fibrillation remains a problem.  Signed, Rozann Lesches, MD    02/26/2017, 9:47 AM

## 2017-02-26 NOTE — Evaluation (Signed)
Physical Therapy Evaluation Patient Details Name: [name] MRN: [id number] DOB: Jul 20, 1930 Today's: 02/26/2017   History of Present Illness  84 yo female with onset of acute on chronic CHF was admitted and noted severe CHF, edema LE's, low K+, EF 35%, PAF, TEE DCCV 1/28, L BBB, cardiomyopathy, HTN, SOB, depression  Clinical Impression  Pt was seen for evaluation of mobility and was able to maintain pulses in 70-90 beat range.  Will recommend SNF as she is not able to walk far, cannot tolerate activity well with signficiant dizziness and weakness in LE's.  Follow acutely for strengthening and progression to gait as tolerated.    Follow Up Recommendations SNF    Equipment Recommendations  Rolling walker with 5" wheels    Recommendations for Other Services       Precautions / Restrictions Precautions Precautions: Fall(telemetry) Restrictions Weight Bearing Restrictions: No      Mobility  Bed Mobility Overal bed mobility: Needs Assistance Bed Mobility: Supine to Sit;Sit to Supine     Supine to sit: Min assist Sit to supine: Min assist   General bed mobility comments: using bed rail and follows cues from PT for good body mechaniis  Transfers Overall transfer level: Needs assistance Equipment used: Rolling walker (2 wheeled) Transfers: Sit to/from Stand Sit to Stand: Min guard;Min assist;From elevated surface            Ambulation/Gait Ambulation/Gait assistance: Min assist Ambulation Distance (Feet): 4 Feet Assistive device: Rolling walker (2 wheeled);1 person hand held assist Gait Pattern/deviations: Step-to pattern;Decreased stride length;Wide base of support;Trunk flexed Gait velocity: reduced Gait velocity interpretation: Below normal speed for age/gender General Gait Details: shuffled sidesteps side of bed due to pt having light headed feelings  Stairs            Wheelchair Mobility    Modified Rankin (Stroke Patients Only)       Balance  Overall balance assessment: Needs assistance;History of Falls Sitting-balance support: Feet supported Sitting balance-Leahy Scale: Fair     Standing balance support: Bilateral upper extremity supported Standing balance-Leahy Scale: Poor                               Pertinent Vitals/Pain Pain Assessment: No/denies pain    Home Living Family/patient expects to be discharged to:: Private residence Living Arrangements: Spouse/significant other Available Help at Discharge: Family Type of Home: House Home Access: Stairs to enter Entrance Stairs-Rails: Right Entrance Stairs-Number of Steps: 2 Home Layout: One level Home Equipment: Environmental consultant - 2 wheels;Cane - quad;Grab bars - tub/shower;Wheelchair - manual;Bedside commode(needs to refit WC)      Prior Function Level of Independence: Independent with assistive device(s)         Comments: furniture walking and using RW     Hand Dominance        Extremity/Trunk Assessment   Upper Extremity Assessment Upper Extremity Assessment: Overall WFL for tasks assessed    Lower Extremity Assessment Lower Extremity Assessment: Generalized weakness    Cervical / Trunk Assessment Cervical / Trunk Assessment: Normal  Communication   Communication: No difficulties  Cognition Arousal/Alertness: Awake/alert Behavior During Therapy: WFL for tasks assessed/performed Overall Cognitive Status: Within Functional Limits for tasks assessed                                 General Comments: has family in to discuss PLOF  General Comments      Exercises     Assessment/Plan    PT Assessment Patient needs continued PT services  PT Problem List Decreased strength;Decreased range of motion;Decreased activity tolerance;Decreased balance;Decreased mobility;Decreased coordination;Decreased knowledge of use of DME;Decreased safety awareness;Cardiopulmonary status limiting activity;Obesity       PT Treatment  Interventions DME instruction;Gait training;Stair training;Functional mobility training;Therapeutic activities;Therapeutic exercise;Balance training;Neuromuscular re-education;Patient/family education    PT Goals (Current goals can be found in the Care Plan section)  Acute Rehab PT Goals Patient Stated Goal: to get stronger and walk more PT Goal Formulation: With patient/family Time For Goal Achievement: 03/12/17 Potential to Achieve Goals: Good    Frequency Min 2X/week   Barriers to discharge Inaccessible home environment stairs to enter house    Co-evaluation               AM-PAC PT "6 Clicks" Daily Activity  Outcome Measure Difficulty turning over in bed (including adjusting bedclothes, sheets and blankets)?: Unable Difficulty moving from lying on back to sitting on the side of the bed? : Unable Difficulty sitting down on and standing up from a chair with arms (e.g., wheelchair, bedside commode, etc,.)?: Unable Help needed moving to and from a bed to chair (including a wheelchair)?: A Little Help needed walking in hospital room?: A Lot Help needed climbing 3-5 steps with a railing? : Total 6 Click Score: 9    End of Session Equipment Utilized During Treatment: Gait belt;Oxygen Activity Tolerance: Patient tolerated treatment well;Patient limited by fatigue Patient left: in bed;with call bell/phone within reach;with bed alarm set;with family/visitor present Nurse Communication: Mobility status PT Visit Diagnosis: Unsteadiness on feet (R26.81);Muscle weakness (generalized) (M62.81);Difficulty in walking, not elsewhere classified (R26.2);Adult, failure to thrive (R62.7)    Time: [phone] PT Time Calculation (min) (ACUTE ONLY): 33 min   Charges:   PT Evaluation $PT Eval Moderate Complexity: 1 Mod PT Treatments $Therapeutic Activity: 8-22 mins   PT G Codes:   PT G-Codes **NOT FOR INPATIENT CLASS** Functional Assessment Tool Used: AM-PAC 6 Clicks Basic Mobility     Ramond Dial 02/26/2017, 11:31 PM   11:33 PM, 02/26/17 Mee Hives, PT, MS Physical Therapist - [place] [phone] (Office)

## 2017-02-26 NOTE — Anesthesia Preprocedure Evaluation (Signed)
Anesthesia Evaluation  Patient identified by MRN, date of birth, ID band Patient awake    Reviewed: Allergy & Precautions, NPO status , Patient's Chart, lab work & pertinent test results  Airway Mallampati: I  TM Distance: >3 FB Neck ROM: Full    Dental  (+) Teeth Intact   Pulmonary neg pulmonary ROS, shortness of breath,    Pulmonary exam normal breath sounds clear to auscultation       Cardiovascular Exercise Tolerance: Poor hypertension, Pt. on home beta blockers and Pt. on medications + CAD (stent) and +CHF  + dysrhythmias (asymptomatic, no CP or SOB) Atrial Fibrillation  Rhythm:Irregular Rate:Normal  A-fib, PVC's, LBBB   Neuro/Psych PSYCHIATRIC DISORDERS Depression    GI/Hepatic PUD,   Endo/Other    Renal/GU      Musculoskeletal  (+) Arthritis ,   Abdominal   Peds  Hematology   Anesthesia Other Findings   Reproductive/Obstetrics                             Anesthesia Physical Anesthesia Plan  ASA: III  Anesthesia Plan: MAC   Post-op Pain Management:    Induction: Intravenous  PONV Risk Score and Plan:   Airway Management Planned: Simple Face Mask  Additional Equipment:   Intra-op Plan:   Post-operative Plan:   Informed Consent: I have reviewed the patients History and Physical, chart, labs and discussed the procedure including the risks, benefits and alternatives for the proposed anesthesia with the patient or authorized representative who has indicated his/her understanding and acceptance.     Plan Discussed with:   Anesthesia Plan Comments:         Anesthesia Quick Evaluation

## 2017-02-26 NOTE — Anesthesia Procedure Notes (Signed)
Procedure Name: MAC Date/Time: 02/26/2017 8:36 AM Performed by: Vista Deck, CRNA Pre-anesthesia Checklist: Patient identified, Emergency Drugs available, Suction available, Timeout performed and Patient being monitored Patient Re-evaluated:Patient Re-evaluated prior to induction Oxygen Delivery Method: Non-rebreather mask

## 2017-02-26 NOTE — Transfer of Care (Signed)
Immediate Anesthesia Transfer of Care Note  Patient: [name]  Procedure(s) Performed: CARDIOVERSION (N/A )  Patient Location: PACU  Anesthesia Type:MAC  Level of Consciousness: awake, alert  and patient cooperative  Airway & Oxygen Therapy: Patient Spontanous Breathing and non-rebreather face mask  Post-op Assessment: Report given to RN and Post -op Vital signs reviewed and stable  Post vital signs: Reviewed and stable  Last Vitals:  Vitals:   02/26/17 0800 02/26/17 0815  BP: 112/78 113/89  Pulse: 83 74  Resp: 19 17  Temp: 37 C   SpO2: 95% 97%    Last Pain:  Vitals:   02/26/17 0354  TempSrc: Oral  PainSc:          Complications: No apparent anesthesia complications

## 2017-02-27 DIAGNOSIS — I429 Cardiomyopathy, unspecified: Secondary | ICD-10-CM

## 2017-02-27 NOTE — Progress Notes (Signed)
PROGRESS NOTE    [name]  LSL:[id number] DOB: 1930-07-15 DOA: 02/20/2017 PCP: Rory Percy, MD     Brief Narrative:  84 year old woman admitted from home on 2/13 with complaints of shortness of breath and lower extremity edema.  She is known to have chronic systolic heart failure and was admitted for IV diuresis.  During a prior hospitalization in January 2019 she was treated with TEE/cardioversion for A. fib with RVR.  Admission was requested.   Assessment & Plan:   Active Problems:   Coronary atherosclerosis of native coronary artery   Atrial fibrillation with rapid ventricular response (HCC)   Acute on chronic systolic CHF (congestive heart failure) ([place])   Persistent atrial fibrillation ([place])   Acute on chronic systolic heart failure -Echo from December 2018 reviewed with ejection fraction of 30-35%. -Seen by cardiology today, they recommend continuing IV Lasix for today with probable transition to oral Lasix in the morning. -Patient is over 2 L negative since admission -Do not believe weights have been documented accordingly:  Filed Weights   02/26/17 0354 02/26/17 1644 02/27/17 0700  Weight: 99.4 kg (219 lb 2.2 oz) 100.3 kg (221 lb 1.9 oz) 99.3 kg (218 lb 14.7 oz)   -Continue metoprolol, losartan has been discontinued due to soft blood pressures.  Persistent atrial fibrillation -Status post successful cardioversion on amiodarone, maintaining sinus rhythm. -Did have transient reversal to A. fib this hospitalization requiring amiodarone drip that has been converted back to oral as of 2/19. -Currently on Xarelto for anticoagulation given a chadsvasc score of 6. -Rate controlled on Toprol-XL, also on amiodarone.  Coronary artery disease status post bare-metal stent to RCA in 2014 -No active angina symptoms and troponin has been negative.  Deconditioning -Will be going to SNF upon discharge.  Hyperlipidemia -Continue statin   DVT prophylaxis: Xarelto Code  Status: Full code Family Communication: Daughter at bedside updated on plan of care and questions answered Disposition Plan: Anticipate discharge to SNF in 24 hours   Consultants:   None  Procedures:   None  Antimicrobials:  Anti-infectives (From admission, onward)   None       Subjective: Lying in bed, feels well, denies palpitations, chest pain or shortness of breath at present.  Objective: Vitals:   02/26/17 1845 02/26/17 1900 02/27/17 0700 02/27/17 1443  BP: (!) 110/59 (!) 104/58 114/61 (!) 100/56  Pulse: 67 (!) 56 (!) 56 (!) 57  Resp:  20 20 18   Temp: 97.8 F (36.6 C) 98.6 F (37 C) 98.7 F (37.1 C) 98.3 F (36.8 C)  TempSrc:  Oral Oral Oral  SpO2: 100% 93% 97% 95%  Weight:   99.3 kg (218 lb 14.7 oz)   Height:        Intake/Output Summary (Last 24 hours) at 02/27/2017 1805 Last data filed at 02/27/2017 1444 Gross per 24 hour  Intake 720 ml  Output 550 ml  Net 170 ml   Filed Weights   02/26/17 0354 02/26/17 1644 02/27/17 0700  Weight: 99.4 kg (219 lb 2.2 oz) 100.3 kg (221 lb 1.9 oz) 99.3 kg (218 lb 14.7 oz)    Examination:  General exam: Alert, awake, oriented x 3 Respiratory system: Clear to auscultation. Respiratory effort normal. Cardiovascular system:RRR. No murmurs, rubs, gallops. Gastrointestinal system: Abdomen is nondistended, soft and nontender. No organomegaly or masses felt. Normal bowel sounds heard. Central nervous system: Alert and oriented. No focal neurological deficits. Extremities: Trace pitting edema bilaterally, positive pedal pulses Skin: No rashes, lesions or ulcers Psychiatry:  Judgement and insight appear normal. Mood & affect appropriate.     Data Reviewed: I have personally reviewed following labs and imaging studies  CBC: No results for input(s): WBC, NEUTROABS, HGB, HCT, MCV, PLT in the last 168 hours. Basic Metabolic Panel: Recent Labs  Lab 02/22/17 0434 02/23/17 0455 02/24/17 0506 02/25/17 0547 02/26/17 0427    NA 137 136 136 138 138  K 3.1* 3.6 3.6 3.5 3.5  CL 100* 102 102 101 101  CO2 25 22 24 25 25   GLUCOSE 91 101* 95 94 92  BUN 21* 23* 24* 24* 24*  CREATININE 0.83 0.90 0.88 0.80 0.80  CALCIUM 9.0 9.1 8.9 9.2 9.1  MG  --  1.9 2.0  --  1.8   GFR: Estimated Creatinine Clearance: 58.8 mL/min (by C-G formula based on SCr of 0.8 mg/dL). Liver Function Tests: No results for input(s): AST, ALT, ALKPHOS, BILITOT, PROT, ALBUMIN in the last 168 hours. No results for input(s): LIPASE, AMYLASE in the last 168 hours. No results for input(s): AMMONIA in the last 168 hours. Coagulation Profile: No results for input(s): INR, PROTIME in the last 168 hours. Cardiac Enzymes: Recent Labs  Lab 02/21/17 0004  TROPONINI <0.03   BNP (last 3 results) No results for input(s): PROBNP in the last 8760 hours. HbA1C: No results for input(s): HGBA1C in the last 72 hours. CBG: No results for input(s): GLUCAP in the last 168 hours. Lipid Profile: No results for input(s): CHOL, HDL, LDLCALC, TRIG, CHOLHDL, LDLDIRECT in the last 72 hours. Thyroid Function Tests: No results for input(s): TSH, T4TOTAL, FREET4, T3FREE, THYROIDAB in the last 72 hours. Anemia Panel: No results for input(s): VITAMINB12, FOLATE, FERRITIN, TIBC, IRON, RETICCTPCT in the last 72 hours. Urine analysis:    Component Value Date/Time   COLORURINE STRAW (A) 01/02/2017 1420   APPEARANCEUR CLEAR 01/02/2017 1420   LABSPEC 1.008 01/02/2017 1420   PHURINE 7.0 01/02/2017 1420   GLUCOSEU NEGATIVE 01/02/2017 1420   HGBUR NEGATIVE 01/02/2017 1420   BILIRUBINUR NEGATIVE 01/02/2017 1420   KETONESUR NEGATIVE 01/02/2017 1420   PROTEINUR NEGATIVE 01/02/2017 1420   NITRITE NEGATIVE 01/02/2017 1420   LEUKOCYTESUR NEGATIVE 01/02/2017 1420   Sepsis Labs: @LABRCNTIP (procalcitonin:4,lacticidven:4)  ) Recent Results (from the past 240 hour(s))  MRSA PCR Screening     Status: None   Collection Time: 02/21/17 10:04 PM  Result Value Ref Range Status    MRSA by PCR NEGATIVE NEGATIVE Final    Comment:        The GeneXpert MRSA Assay (FDA approved for NASAL specimens only), is one component of a comprehensive MRSA colonization surveillance program. It is not intended to diagnose MRSA infection nor to guide or monitor treatment for MRSA infections. Performed at [facility], [address], [place], [place] [address]          Radiology Studies: No results found.      Scheduled Meds: . amiodarone  200 mg Oral BID  . aspirin EC  81 mg Oral Daily  . atorvastatin  20 mg Oral QHS  . bisacodyl  10 mg Rectal Once  . docusate sodium  100 mg Oral BID  . furosemide  40 mg Intravenous BID  . metoprolol succinate  50 mg Oral Daily  . potassium chloride SA  20 mEq Oral BID  . Rivaroxaban  15 mg Oral QAC supper  . sodium chloride flush  3 mL Intravenous Q12H  . sodium chloride flush  3 mL Intravenous Q12H   Continuous Infusions: . sodium  chloride 250 mL (02/24/17 2013)  . sodium chloride       LOS: 7 days    Time spent: 25 minutes. Greater than 50% of this time was spent in direct contact with the patient coordinating care.     Lelon Frohlich, MD [facility] Pager [phone]  If 7PM-7AM, please contact night-coverage [email or web address] Password [facility] 02/27/2017, 6:05 PM

## 2017-02-27 NOTE — NC FL2 (Signed)
[place] MEDICAID FL2 LEVEL OF CARE SCREENING TOOL     IDENTIFICATION  Patient Name: [name] Birthdate: 10/31/1930 Sex: female Admission Date (Current Location): 02/20/2017  [facility] and [place] Number:  [employer] and Address:  [place] [address], [place]      Provider Number: [id number]  Attending Physician Name and Address:  Isaac Bliss, Olam Idler*  Relative Name and Phone Number:       Current Level of Care: Hospital Recommended Level of Care: [place] Prior Approval Number:    Date Approved/Denied:   PASRR Number: [id number] 02/27/17-03/29/17  Discharge Plan: SNF    Current Diagnoses: Patient Active Problem List   Diagnosis Date Noted  . Persistent atrial fibrillation ([place]) 02/22/2017  . Acute on chronic systolic CHF (congestive heart failure) ([place]) 02/20/2017  . Acute dyspnea   . Chest pain 02/01/2017  . Acute systolic CHF (congestive heart failure) ([place]) 01/21/2017  . Atrial fibrillation with rapid ventricular response ([place]) 01/02/2017  . Arthritis of midfoot 01/13/2016  . Right shoulder pain 01/13/2016  . Ulcerative colitis ([place]) 05/03/2014  . Bilateral leg edema 11/30/2013  . Abnormal myocardial perfusion study 09/17/2013  . Coronary atherosclerosis of native coronary artery 09/03/2012  . Secondary cardiomyopathy ([place]) 09/03/2012  . Mixed hyperlipidemia 01/21/2009  . Essential hypertension, benign 01/21/2009  . Atrial flutter ([place]) 10/06/2008    Orientation RESPIRATION BLADDER Height & Weight     Self, Time, Situation, Place  Normal Continent Weight: 218 lb 14.7 oz (99.3 kg) Height:  5' 3"  (160 cm)  BEHAVIORAL SYMPTOMS/MOOD NEUROLOGICAL BOWEL NUTRITION STATUS      Continent Diet(Heart Healthy. Fluid restriction 1228m)  AMBULATORY STATUS COMMUNICATION OF NEEDS Skin   Limited Assist Verbally Normal                       Personal Care Assistance Level of Assistance   Bathing, Feeding, Dressing Bathing Assistance: Limited assistance Feeding assistance: Independent Dressing Assistance: Limited assistance     Functional Limitations Info  Sight, Hearing, Speech Sight Info: Adequate Hearing Info: Adequate Speech Info: Adequate    SPECIAL CARE FACTORS FREQUENCY  PT (By licensed PT)     PT Frequency: 5x/week              Contractures Contractures Info: Not present    Additional Factors Info  Code Status, Allergies Code Status Info: Full Code Allergies Info: Sinus and Allergy, Demerol, Penicillins,            Current Medications (02/27/2017):  This is the current hospital active medication list Current Facility-Administered Medications  Medication Dose Route Frequency Provider Last Rate Last Dose  . 0.9 %  sodium chloride infusion  250 mL Intravenous PRN TOrson Eva MD 10 mL/hr at 02/24/17 2013 250 mL at 02/24/17 2013  . 0.9 %  sodium chloride infusion  250 mL Intravenous Continuous Strader, BTanzaniaM, PA-C      . acetaminophen (TYLENOL) tablet 650 mg  650 mg Oral Q4H PRN Tat, David, MD      . amiodarone (PACERONE) tablet 200 mg  200 mg Oral BID MSatira Sark MD   200 mg at 02/27/17 1012  . aspirin EC tablet 81 mg  81 mg Oral Daily Tat, David, MD   81 mg at 02/27/17 1013  . atorvastatin (LIPITOR) tablet 20 mg  20 mg Oral QBenay Pike MD   20 mg at 02/26/17 2145  . bisacodyl (DULCOLAX) suppository  10 mg  10 mg Rectal Once Tat, David, MD      . docusate sodium (COLACE) capsule 100 mg  100 mg Oral BID Tat, David, MD   100 mg at 02/27/17 1013  . furosemide (LASIX) injection 40 mg  40 mg Intravenous BID Orson Eva, MD   40 mg at 02/27/17 1012  . ketotifen (ZADITOR) 0.025 % ophthalmic solution 2 drop  2 drop Both Eyes TID PRN Tat, David, MD      . metoprolol succinate (TOPROL-XL) 24 hr tablet 50 mg  50 mg Oral Daily Tat, David, MD   50 mg at 02/27/17 1012  . ondansetron (ZOFRAN) injection 4 mg  4 mg Intravenous Q6H PRN Orson Eva, MD    4 mg at 02/23/17 1236  . potassium chloride SA (K-DUR,KLOR-CON) CR tablet 20 mEq  20 mEq Oral BID Tat, Shanon Brow, MD   20 mEq at 02/27/17 1012  . Rivaroxaban (XARELTO) tablet 15 mg  15 mg Oral QAC supper Tat, Shanon Brow, MD   15 mg at 02/26/17 1645  . sodium chloride flush (NS) 0.9 % injection 3 mL  3 mL Intravenous Therisa Doyne, MD   3 mL at 02/25/17 2107  . sodium chloride flush (NS) 0.9 % injection 3 mL  3 mL Intravenous PRN Tat, David, MD      . sodium chloride flush (NS) 0.9 % injection 3 mL  3 mL Intravenous Q12H Strader, [place] M, PA-C   3 mL at 02/27/17 1013  . sodium chloride flush (NS) 0.9 % injection 3 mL  3 mL Intravenous PRN Ahmed Prima Fransisco Hertz, PA-C         Discharge Medications: Please see discharge summary for a list of discharge medications.  Relevant Imaging Results:  Relevant Lab Results:   Additional Information SSN [id number], Clydene Pugh, LCSW

## 2017-02-27 NOTE — Progress Notes (Signed)
Progress Note  Patient Name: [name] Date of Encounter: 02/27/2017  Primary Cardiologist: Dr. Satira Sark  Subjective   Feels somewhat better today.  No chest pain or palpitations.  Still weak overall.  Inpatient Medications    Scheduled Meds: . amiodarone  200 mg Oral BID  . aspirin EC  81 mg Oral Daily  . atorvastatin  20 mg Oral QHS  . bisacodyl  10 mg Rectal Once  . docusate sodium  100 mg Oral BID  . furosemide  40 mg Intravenous BID  . metoprolol succinate  50 mg Oral Daily  . potassium chloride SA  20 mEq Oral BID  . Rivaroxaban  15 mg Oral QAC supper  . sodium chloride flush  3 mL Intravenous Q12H  . sodium chloride flush  3 mL Intravenous Q12H   Continuous Infusions: . sodium chloride 250 mL (02/24/17 2013)  . sodium chloride     PRN Meds: sodium chloride, acetaminophen, ketotifen, ondansetron (ZOFRAN) IV, sodium chloride flush, sodium chloride flush   Vital Signs    Vitals:   02/26/17 1644 02/26/17 1845 02/26/17 1900 02/27/17 0700  BP:  (!) 110/59 (!) 104/58 114/61  Pulse:  67 (!) 56 (!) 56  Resp:   20 20  Temp:  97.8 F (36.6 C) 98.6 F (37 C) 98.7 F (37.1 C)  TempSrc:   Oral Oral  SpO2:  100% 93% 97%  Weight: 221 lb 1.9 oz (100.3 kg)   218 lb 14.7 oz (99.3 kg)  Height:        Intake/Output Summary (Last 24 hours) at 02/27/2017 1102 Last data filed at 02/27/2017 1027 Gross per 24 hour  Intake 600 ml  Output 700 ml  Net -100 ml   Filed Weights   02/26/17 0354 02/26/17 1644 02/27/17 0700  Weight: 219 lb 2.2 oz (99.4 kg) 221 lb 1.9 oz (100.3 kg) 218 lb 14.7 oz (99.3 kg)    Telemetry    Sinus rhythm.  Personally reviewed.  ECG    Sinus arrhythmia with prolonged PR interval and IVCD consistent with atypical left bundle branch block.  Personally reviewed.  Physical Exam   GEN:  Elderly woman, no distress.   Neck: No JVD. Cardiac: RRR, no gallop.  Respiratory: Nonlabored. Clear to auscultation bilaterally. GI: Soft,  nontender, bowel sounds present. MS:  Trace ankle edema; No deformity. Neuro:  Nonfocal. Psych: Alert and oriented x 3. Normal affect.  Labs    Chemistry Recent Labs  Lab 02/24/17 0506 02/25/17 0547 02/26/17 0427  NA 136 138 138  K 3.6 3.5 3.5  CL 102 101 101  CO2 24 25 25   GLUCOSE 95 94 92  BUN 24* 24* 24*  CREATININE 0.88 0.80 0.80  CALCIUM 8.9 9.2 9.1  GFRNONAA 59* >60 >60  GFRAA >60 >60 >60  ANIONGAP 10 12 12      Hematology Recent Labs  Lab 02/20/17 1253  WBC 7.4  RBC 4.43  HGB 11.5*  HCT 36.6  MCV 82.6  MCH 26.0  MCHC 31.4  RDW 14.9  PLT 331    Cardiac Enzymes Recent Labs  Lab 02/20/17 1253 02/20/17 1704 02/21/17 0004  TROPONINI <0.03 <0.03 <0.03   No results for input(s): TROPIPOC in the last 168 hours.   BNP Recent Labs  Lab 02/20/17 1253 02/24/17 0506  BNP 465.0* 308.0*     Radiology    Dg Chest Port 1 View  Result Date: 02/25/2017 CLINICAL DATA:  84 year old female admitted with shortness of  breath, recurrent atrial fibrillation with RVR, acute on chronic systolic CHF. EXAM: PORTABLE CHEST 1 VIEW COMPARISON:  02/20/2017 and earlier. FINDINGS: Portable AP upright view at 1229 hrs. Stable cardiomegaly and mediastinal contours. Decreased bilateral pulmonary vascularity, now appearing near baseline. No pneumothorax, pleural effusion or confluent pulmonary opacity. Visualized tracheal air column is within normal limits. Advanced degenerative changes of the shoulders. IMPRESSION: 1. Resolved pulmonary interstitial edema. 2. No new cardiopulmonary abnormality. Electronically Signed   By: Genevie Ann M.D.   On: 02/25/2017 12:37    Cardiac Studies   TEE 02/04/2017: Study Conclusions  - Left ventricle: The cavity size was normal. Systolic function was normal. The estimated ejection fraction was in the range of 35% to 40%. No evidence of thrombus. - Mitral valve: There was mild regurgitation. - Left atrium: The atrium was moderately dilated. No  evidence of thrombus in the atrial cavity or appendage. Normal LAA emptying velocity of 68 cm/s. - Right atrium: No evidence of thrombus in the atrial cavity or appendage.  Patient Profile     84 y.o. female with a history of CAD status post BMS to the RCA in 2014, hypertension, paroxysmal to persistent atrial fibrillation with possible tachycardia-mediated cardiomyopathy and LVEF approximately 35%.  She presents with acute on chronic systolic heart failure in the setting of recurrent rapid atrial fibrillation.  Assessment & Plan    1.  Persistent atrial fibrillation now status post successful cardioversion on amiodarone.  She is maintaining sinus rhythm.  CHADSVASC score is 6, currently on Xarelto.  2.  Secondary cardiomyopathy with LVEF approximately 35-40%.  Possibly tachycardia-mediated.  Continue with medical therapy.  3.  CAD status post BMS to the RCA in 2014.  No active angina symptoms and troponin I negative.  4.  Acute on chronic systolic heart failure.  5.  Deconditioning.  Continue Xarelto, amiodarone at 200 mg twice daily for now, Lipitor, and Toprol-XL.  Continue IV Lasix through today and then consider conversion to oral regimen tomorrow.  Follow up BMET in a.m.  Signed, Rozann Lesches, MD  02/27/2017, 11:02 AM

## 2017-02-27 NOTE — Clinical Social Work Note (Signed)
Clinical Social Work Assessment  Patient Details  Name: [name] MRN: [id number] Date of Birth: Apr 21, 1930  Date of referral:  02/27/17               Reason for consult:  Facility Placement                Permission sought to share information with:    Permission granted to share information::     Name::        Agency::     Relationship::     Contact Information:     Housing/Transportation Living arrangements for the past 2 months:  Single Family Home Source of Information:  Patient Patient Interpreter Needed:  None Criminal Activity/Legal Involvement Pertinent to Current Situation/Hospitalization:  No - Comment as needed Significant Relationships:  Spouse, Adult Children, Other Family Members Lives with:  Spouse Do you feel safe going back to the place where you live?  Yes Need for family participation in patient care:  No (Coment)  Care giving concerns:  No care giving concerns identified at baseline.    Social Worker assessment / plan:  At baseline patient ambulates independently in her home but uses a cane when outside and out in the community. She is also independent in her ADLs at baseline. Patient is agreeable to STR at a SNF.    Employment status:  Retired Nurse, adult PT Recommendations:  [place] / Referral to community resources:  [place]  Patient/Family's Response to care:  Patient is agreeable STR at [facility].   Patient/Family's Understanding of and Emotional Response to Diagnosis, Current Treatment, and Prognosis:  Patient demonstrates and verbalizes understanding of her diagnosis, treatment and prognosis.   Emotional Assessment Appearance:  Appears stated age Attitude/Demeanor/Rapport:    Affect (typically observed):  Accepting Orientation:  Oriented to Self, Oriented to Place, Oriented to  Time, Oriented to Situation Alcohol / Substance use:  Not Applicable Psych involvement  (Current and /or in the community):  No (Comment)  Discharge Needs  Concerns to be addressed:  Discharge Planning Concerns Readmission within the last 30 days:  Yes Current discharge risk:  None Barriers to Discharge:  No Barriers Identified   Ihor Gully, LCSW 02/27/2017, 3:57 PM

## 2017-02-27 NOTE — Addendum Note (Signed)
Addendum  created 02/27/17 1236 by Vista Deck, CRNA   Charge Capture section accepted

## 2017-02-27 NOTE — NC FL2 (Deleted)
[place] MEDICAID FL2 LEVEL OF CARE SCREENING TOOL     IDENTIFICATION  Patient Name: [name] Birthdate: January 24, 1930 Sex: female Admission Date (Current Location): 02/20/2017  [facility] and [place] Number:  [employer] and Address:  [place] [address], [place]      Provider Number: [id number]  Attending Physician Name and Address:  Isaac Bliss, [place]  Relative Name and Phone Number:       Current Level of Care: Hospital Recommended Level of Care: [place] Prior Approval Number:    Date Approved/Denied:   PASRR Number:    Discharge Plan: SNF    Current Diagnoses: Patient Active Problem List   Diagnosis Date Noted  . Persistent atrial fibrillation ([place]) 02/22/2017  . Acute on chronic systolic CHF (congestive heart failure) ([place]) 02/20/2017  . Acute dyspnea   . Chest pain 02/01/2017  . Acute systolic CHF (congestive heart failure) ([place]) 01/21/2017  . Atrial fibrillation with rapid ventricular response ([place]) 01/02/2017  . Arthritis of midfoot 01/13/2016  . Right shoulder pain 01/13/2016  . Ulcerative colitis ([place]) 05/03/2014  . Bilateral leg edema 11/30/2013  . Abnormal myocardial perfusion study 09/17/2013  . Coronary atherosclerosis of native coronary artery 09/03/2012  . Secondary cardiomyopathy ([place]) 09/03/2012  . Mixed hyperlipidemia 01/21/2009  . Essential hypertension, benign 01/21/2009  . Atrial flutter ([place]) 10/06/2008    Orientation RESPIRATION BLADDER Height & Weight     Self, Time, Situation, Place  Normal Continent Weight: 218 lb 14.7 oz (99.3 kg) Height:  5' 3"  (160 cm)  BEHAVIORAL SYMPTOMS/MOOD NEUROLOGICAL BOWEL NUTRITION STATUS      Continent Diet(Heart Healthy. Fluid restriction 1252m)  AMBULATORY STATUS COMMUNICATION OF NEEDS Skin   Limited Assist Verbally Normal                       Personal Care Assistance Level of Assistance  Bathing, Feeding, Dressing  Bathing Assistance: Limited assistance Feeding assistance: Independent Dressing Assistance: Limited assistance     Functional Limitations Info  Sight, Hearing, Speech Sight Info: Adequate Hearing Info: Adequate Speech Info: Adequate    SPECIAL CARE FACTORS FREQUENCY  PT (By licensed PT)     PT Frequency: 5x/week              Contractures Contractures Info: Not present    Additional Factors Info  Code Status, Allergies Code Status Info: Full Code Allergies Info: Sinus and Allergy, Demerol, Penicillins,            Current Medications (02/27/2017):  This is the current hospital active medication list Current Facility-Administered Medications  Medication Dose Route Frequency Provider Last Rate Last Dose  . 0.9 %  sodium chloride infusion  250 mL Intravenous PRN TOrson Eva MD 10 mL/hr at 02/24/17 2013 250 mL at 02/24/17 2013  . 0.9 %  sodium chloride infusion  250 mL Intravenous Continuous Strader, BTanzaniaM, PA-C      . acetaminophen (TYLENOL) tablet 650 mg  650 mg Oral Q4H PRN Tat, David, MD      . amiodarone (PACERONE) tablet 200 mg  200 mg Oral BID MSatira Sark MD   200 mg at 02/27/17 1012  . aspirin EC tablet 81 mg  81 mg Oral Daily Tat, David, MD   81 mg at 02/27/17 1013  . atorvastatin (LIPITOR) tablet 20 mg  20 mg Oral QBenay Pike MD   20 mg at 02/26/17 2145  . bisacodyl (DULCOLAX) suppository  10 mg  10 mg Rectal Once Tat, David, MD      . docusate sodium (COLACE) capsule 100 mg  100 mg Oral BID Tat, David, MD   100 mg at 02/27/17 1013  . furosemide (LASIX) injection 40 mg  40 mg Intravenous BID Orson Eva, MD   40 mg at 02/27/17 1012  . ketotifen (ZADITOR) 0.025 % ophthalmic solution 2 drop  2 drop Both Eyes TID PRN Tat, David, MD      . metoprolol succinate (TOPROL-XL) 24 hr tablet 50 mg  50 mg Oral Daily Tat, David, MD   50 mg at 02/27/17 1012  . ondansetron (ZOFRAN) injection 4 mg  4 mg Intravenous Q6H PRN Orson Eva, MD   4 mg at 02/23/17 1236  .  potassium chloride SA (K-DUR,KLOR-CON) CR tablet 20 mEq  20 mEq Oral BID Tat, Shanon Brow, MD   20 mEq at 02/27/17 1012  . Rivaroxaban (XARELTO) tablet 15 mg  15 mg Oral QAC supper Tat, Shanon Brow, MD   15 mg at 02/26/17 1645  . sodium chloride flush (NS) 0.9 % injection 3 mL  3 mL Intravenous Therisa Doyne, MD   3 mL at 02/25/17 2107  . sodium chloride flush (NS) 0.9 % injection 3 mL  3 mL Intravenous PRN Tat, David, MD      . sodium chloride flush (NS) 0.9 % injection 3 mL  3 mL Intravenous Q12H Strader, [place] M, PA-C   3 mL at 02/27/17 1013  . sodium chloride flush (NS) 0.9 % injection 3 mL  3 mL Intravenous PRN Ahmed Prima Fransisco Hertz, PA-C         Discharge Medications: Please see discharge summary for a list of discharge medications.  Relevant Imaging Results:  Relevant Lab Results:   Additional Information SSN [id number] Clydene Pugh, LCSW

## 2017-02-28 ENCOUNTER — Inpatient Hospital Stay
Admission: RE | Admit: 2017-02-28 | Discharge: 2017-03-09 | Disposition: A | Discharge status: Home / Self Care | Payer: Medicare Other | Source: Ambulatory Visit | Attending: Internal Medicine | Admitting: Internal Medicine

## 2017-02-28 DIAGNOSIS — J101 Influenza due to other identified influenza virus with other respiratory manifestations: Secondary | ICD-10-CM | POA: Diagnosis not present

## 2017-02-28 DIAGNOSIS — I480 Paroxysmal atrial fibrillation: Secondary | ICD-10-CM | POA: Diagnosis not present

## 2017-02-28 DIAGNOSIS — E876 Hypokalemia: Secondary | ICD-10-CM | POA: Diagnosis not present

## 2017-02-28 DIAGNOSIS — R0602 Shortness of breath: Secondary | ICD-10-CM | POA: Diagnosis not present

## 2017-02-28 DIAGNOSIS — I5021 Acute systolic (congestive) heart failure: Secondary | ICD-10-CM | POA: Diagnosis not present

## 2017-02-28 DIAGNOSIS — M6281 Muscle weakness (generalized): Secondary | ICD-10-CM | POA: Diagnosis not present

## 2017-02-28 DIAGNOSIS — E785 Hyperlipidemia, unspecified: Secondary | ICD-10-CM | POA: Diagnosis not present

## 2017-02-28 DIAGNOSIS — I10 Essential (primary) hypertension: Secondary | ICD-10-CM | POA: Diagnosis not present

## 2017-02-28 DIAGNOSIS — I5023 Acute on chronic systolic (congestive) heart failure: Secondary | ICD-10-CM | POA: Diagnosis not present

## 2017-02-28 DIAGNOSIS — K5100 Ulcerative (chronic) pancolitis without complications: Secondary | ICD-10-CM | POA: Diagnosis not present

## 2017-02-28 DIAGNOSIS — I4891 Unspecified atrial fibrillation: Secondary | ICD-10-CM | POA: Diagnosis not present

## 2017-02-28 DIAGNOSIS — I429 Cardiomyopathy, unspecified: Secondary | ICD-10-CM | POA: Diagnosis not present

## 2017-02-28 DIAGNOSIS — M1990 Unspecified osteoarthritis, unspecified site: Secondary | ICD-10-CM | POA: Diagnosis not present

## 2017-02-28 DIAGNOSIS — E782 Mixed hyperlipidemia: Secondary | ICD-10-CM | POA: Diagnosis not present

## 2017-02-28 DIAGNOSIS — I481 Persistent atrial fibrillation: Secondary | ICD-10-CM | POA: Diagnosis not present

## 2017-02-28 DIAGNOSIS — I5022 Chronic systolic (congestive) heart failure: Secondary | ICD-10-CM | POA: Diagnosis not present

## 2017-02-28 DIAGNOSIS — I2510 Atherosclerotic heart disease of native coronary artery without angina pectoris: Secondary | ICD-10-CM | POA: Diagnosis not present

## 2017-02-28 DIAGNOSIS — K5900 Constipation, unspecified: Secondary | ICD-10-CM | POA: Diagnosis not present

## 2017-02-28 DIAGNOSIS — R262 Difficulty in walking, not elsewhere classified: Secondary | ICD-10-CM | POA: Diagnosis not present

## 2017-02-28 DIAGNOSIS — K5120 Ulcerative (chronic) proctitis without complications: Secondary | ICD-10-CM | POA: Diagnosis not present

## 2017-02-28 DIAGNOSIS — I25119 Atherosclerotic heart disease of native coronary artery with unspecified angina pectoris: Secondary | ICD-10-CM | POA: Diagnosis not present

## 2017-02-28 DIAGNOSIS — R600 Localized edema: Secondary | ICD-10-CM | POA: Diagnosis not present

## 2017-02-28 DIAGNOSIS — H3530 Unspecified macular degeneration: Secondary | ICD-10-CM | POA: Diagnosis not present

## 2017-02-28 DIAGNOSIS — R279 Unspecified lack of coordination: Secondary | ICD-10-CM | POA: Diagnosis not present

## 2017-02-28 DIAGNOSIS — R509 Fever, unspecified: Secondary | ICD-10-CM | POA: Diagnosis not present

## 2017-02-28 DIAGNOSIS — F329 Major depressive disorder, single episode, unspecified: Secondary | ICD-10-CM | POA: Diagnosis not present

## 2017-02-28 LAB — BASIC METABOLIC PANEL
ANION GAP: 11 (ref 5–15)
BUN: 25 mg/dL — ABNORMAL HIGH (ref 6–20)
CALCIUM: 9.1 mg/dL (ref 8.9–10.3)
CHLORIDE: 100 mmol/L — AB (ref 101–111)
CO2: 26 mmol/L (ref 22–32)
Creatinine, Ser: 0.94 mg/dL (ref 0.44–1.00)
GFR calc Af Amer: >60 mL/min (ref >60)
GFR calc non Af Amer: 54 mL/min — ABNORMAL LOW (ref >60)
GLUCOSE: 87 mg/dL (ref 65–99)
Potassium: 3.5 mmol/L (ref 3.5–5.1)
Sodium: 137 mmol/L (ref 135–145)

## 2017-02-28 MED ORDER — POTASSIUM CHLORIDE CRYS ER 20 MEQ PO TBCR: 20.0000 meq | EXTENDED_RELEASE_TABLET | Freq: Two times a day (BID) | ORAL | Status: DC

## 2017-02-28 MED ORDER — PHENOL 1.4 % MT LIQD
1.0000 | OROMUCOSAL | Status: DC | PRN
  Administered 2017-02-28: 1 via OROMUCOSAL
  Filled 2017-02-28: qty 177

## 2017-02-28 MED ORDER — METOPROLOL SUCCINATE ER 50 MG PO TB24: 50.0000 mg | ORAL_TABLET | Freq: Two times a day (BID) | ORAL | Status: DC

## 2017-02-28 MED ORDER — FUROSEMIDE 20 MG PO TABS: 60.0000 mg | ORAL_TABLET | Freq: Two times a day (BID) | ORAL | Status: DC

## 2017-02-28 MED ORDER — AMIODARONE HCL 200 MG PO TABS: 200.0000 mg | ORAL_TABLET | Freq: Two times a day (BID) | ORAL | Status: DC

## 2017-02-28 NOTE — Clinical Social Work Placement (Signed)
   CLINICAL SOCIAL WORK PLACEMENT  NOTE  Date:  02/28/2017  Patient Details  Name: [name] MRN: [id number] Date of Birth: [birth year]  Clinical Social Work is seeking post-discharge placement for this patient at the [place] level of care (*CSW will initial, date and re-position this form in  chart as items are completed):  Yes   Patient/family provided with [place] Work Department's list of facilities offering this level of care within the geographic area requested by the patient (or if unable, by the patient's family).  Yes   Patient/family informed of their freedom to choose among providers that offer the needed level of care, that participate in Medicare, Medicaid or managed care program needed by the patient, have an available bed and are willing to accept the patient.  Yes   Patient/family informed of [facility]'s ownership interest in [facility] and [facility], as well as of the fact that they are under no obligation to receive care at these facilities.  PASRR submitted to EDS on 02/27/17     PASRR number received on 02/27/17     Existing PASRR number confirmed on       FL2 transmitted to all facilities in geographic area requested by pt/family on 02/27/17     FL2 transmitted to all facilities within larger geographic area on       Patient informed that his/her managed care company has contracts with or will negotiate with certain facilities, including the following:        Yes   Patient/family informed of bed offers received.  Patient chooses bed at [facility]     Physician recommends and patient chooses bed at      Patient to be transferred to [facility] on  .  Patient to be transferred to facility by [facility] Staff      Patient family notified on 02/28/17 of transfer.  Name of family member notified:  attempted to notify spouse, received no answer.      PHYSICIAN       Additional Comment:    Discharge clinicals sent and facility notified.  _______________________________________________ Ambrose Pancoast D, LCSW 02/28/2017, 12:50 PM

## 2017-02-28 NOTE — Plan of Care (Signed)
  Education: Knowledge of General Education information will improve 02/28/2017 0327 - Progressing by Cassandria Anger, RN   Health Behavior/Discharge Planning: Ability to manage health-related needs will improve 02/28/2017 0327 - Progressing by Cass[place]ria Anger, RN   Clinical Measurements: Ability to maintain clinical measurements within normal limits will improve 02/28/2017 0327 - Progressing by Cass[place]ria Anger, RN Will remain free from infection 02/28/2017 0327 - Progressing by Cass[place]ria Anger, RN Respiratory complications will improve 02/28/2017 0327 - Progressing by Cass[place]ria Anger, RN Cardiovascular complication will be avoided 02/28/2017 0327 - Progressing by Cass[place]ria Anger, RN   Clinical Measurements: Will remain free from infection 02/28/2017 0327 - Progressing by Cass[place]ria Anger, RN   Activity: Risk for activity intolerance will decrease 02/28/2017 0327 - Progressing by Cassandria Anger, RN   Activity: Capacity to carry out activities will improve 02/28/2017 0327 - Progressing by Cassandria Anger, RN   Nutrition: Adequate nutrition will be maintained 02/28/2017 0327 - Progressing by Cassandria Anger, RN

## 2017-02-28 NOTE — Progress Notes (Signed)
Report called to Delphina, RN at [facility] and all questions answered.  Patient to be transported to the [facility] via wheelchair.

## 2017-02-28 NOTE — Care Management Important Message (Signed)
Important Message  Patient Details  Name: [name] MRN: [id number] Date of Birth: [birth year]   Medicare Important Message Given:  Yes    [name], [name], RN 02/28/2017, 9:47 AM

## 2017-02-28 NOTE — Progress Notes (Signed)
Progress Note  Patient Name: [name] Date of Encounter: 02/28/2017  Primary Cardiologist: Dr. Satira Sark  Subjective   Had cough and some chest congestion last night.  No definite palpitations or chest pain.  Inpatient Medications    Scheduled Meds: . amiodarone  200 mg Oral BID  . aspirin EC  81 mg Oral Daily  . atorvastatin  20 mg Oral QHS  . bisacodyl  10 mg Rectal Once  . docusate sodium  100 mg Oral BID  . furosemide  40 mg Intravenous BID  . metoprolol succinate  50 mg Oral Daily  . potassium chloride SA  20 mEq Oral BID  . Rivaroxaban  15 mg Oral QAC supper  . sodium chloride flush  3 mL Intravenous Q12H  . sodium chloride flush  3 mL Intravenous Q12H   Continuous Infusions: . sodium chloride 250 mL (02/24/17 2013)  . sodium chloride     PRN Meds: sodium chloride, acetaminophen, ketotifen, ondansetron (ZOFRAN) IV, sodium chloride flush, sodium chloride flush   Vital Signs    Vitals:   02/27/17 0700 02/27/17 1443 02/27/17 1902 02/28/17 0500  BP: 114/61 (!) 100/56 107/77 112/66  Pulse: (!) 56 (!) 57 83 66  Resp: 20 18 18 18   Temp: 98.7 F (37.1 C) 98.3 F (36.8 C) 98.4 F (36.9 C) 98.4 F (36.9 C)  TempSrc: Oral Oral Oral Oral  SpO2: 97% 95% 96% 96%  Weight: 218 lb 14.7 oz (99.3 kg)   219 lb 5.7 oz (99.5 kg)  Height:        Intake/Output Summary (Last 24 hours) at 02/28/2017 1002 Last data filed at 02/28/2017 0520 Gross per 24 hour  Intake 480 ml  Output 650 ml  Net -170 ml   Filed Weights   02/26/17 1644 02/27/17 0700 02/28/17 0500  Weight: 221 lb 1.9 oz (100.3 kg) 218 lb 14.7 oz (99.3 kg) 219 lb 5.7 oz (99.5 kg)    Telemetry    Atrial fibrillation.  Personally reviewed.  Physical Exam   GEN:  Elderly woman, no acute distress.   Neck: No JVD. Cardiac:  Irregularly irregular, no gallop.  Respiratory: Nonlabored.  No wheezing. GI: Soft, nontender, bowel sounds present. MS:  Trace ankle edema; No deformity. Neuro:   Nonfocal. Psych: Alert and oriented x 3. Normal affect.  Labs    Chemistry Recent Labs  Lab 02/25/17 0547 02/26/17 0427 02/28/17 0815  NA 138 138 137  K 3.5 3.5 3.5  CL 101 101 100*  CO2 25 25 26   GLUCOSE 94 92 87  BUN 24* 24* 25*  CREATININE 0.80 0.80 0.94  CALCIUM 9.2 9.1 9.1  GFRNONAA >60 >60 54*  GFRAA >60 >60 >60  ANIONGAP 12 12 11      BNP Recent Labs  Lab 02/24/17 0506  BNP 308.0*     Radiology    No results found.  Cardiac Studies   TEE 02/04/2017: Study Conclusions  - Left ventricle: The cavity size was normal. Systolic function was normal. The estimated ejection fraction was in the range of 35% to 40%. No evidence of thrombus. - Mitral valve: There was mild regurgitation. - Left atrium: The atrium was moderately dilated. No evidence of thrombus in the atrial cavity or appendage. Normal LAA emptying velocity of 68 cm/s. - Right atrium: No evidence of thrombus in the atrial cavity or appendage.  Patient Profile     84 y.o. female with a history of CAD status post BMS to the RCA  in 2014, hypertension, paroxysmal to persistent atrial fibrillation with possible tachycardia-mediated cardiomyopathy and LVEF approximately 35%. She presents with acute on chronic systolic heart failure in the setting of recurrent rapid atrial fibrillation.  Assessment & Plan    1.  Persistent atrial fibrillation, unfortunately she has returned to atrial fibrillation after successful cardioversion on February 19.  CHADSVASC score is 6 and she remains on Xarelto.   2.  Secondary cardiomyopathy with LVEF approximately 35-40%.  Possibly tachycardia-mediated.  3.  CAD status post BMS to the RCA in 2014.  He reports no active angina with negative troponin I.  4.  Acute on chronic systolic heart failure.  Intake and output fairly even last 24 hours.  5.  Deconditioning.  Discussed situation with the patient.  Anticipate continued difficulty maintaining sinus rhythm  at this point.  She has a moderately dilated left atrium as well.  Would not go to flecainide since she has ischemic heart disease, and with IVCD and prolonged QT, not a good candidate for Tikosyn.  Plan to continue amiodarone for now but I will increase Toprol-XL to provide better heart rate control, continue Xarelto.  Convert to oral Lasix.  Would like to make sure heart rate control is adequate, but she still should be able to proceed with plans for rehabilitation.  We will plan on EP referral likely as an outpatient to discuss other treatment options such as ablation, but these may be limited as well.  Signed, Rozann Lesches, MD  02/28/2017, 10:02 AM

## 2017-02-28 NOTE — Discharge Summary (Signed)
Physician Discharge Summary  [name] OYD:[id number] DOB: 1930-09-17 DOA: 02/20/2017  PCP: Rory Percy, MD  Admit date: 02/20/2017 Discharge date: 02/28/2017  Time spent: 45 minutes  Recommendations for Outpatient Follow-up:  -Will be discharged to SNF today for ST-rehab.   Discharge Diagnoses:  Active Problems:   Coronary atherosclerosis of native coronary artery   Atrial fibrillation with rapid ventricular response (HCC)   Acute on chronic systolic CHF (congestive heart failure) (HCC)   Persistent atrial fibrillation [facility])   Discharge Condition: Stable and improved  Filed Weights   02/26/17 1644 02/27/17 0700 02/28/17 0500  Weight: 100.3 kg (221 lb 1.9 oz) 99.3 kg (218 lb 14.7 oz) 99.5 kg (219 lb 5.7 oz)    History of present illness:  As per Dr. Carles Collet on 2/13: [name] is a 84 y.o. female with medical history of paroxysmal atrial fibrillation, cardiomyopathy, hyperlipidemia, hypertension, depression presented with 3-day history of shortness of breath, increasing lower extremity edema, increasing abdominal girth, and orthopnea.  Patient was recently admitted to the hospital from 02/01/17  through 02/05/2017.  During that hospitalization, the patient was treated for atrial fibrillation with RVR.  She underwent TEE DCCV on 02/04/2017.  The patient endorses compliance with all her medications.  She denies any fevers, chills, headache, dizziness, nausea, vomiting, diarrhea, abdominal pain, dysuria, hematuria.  The patient had to sleep in a recliner on the evening of 02/19/2017 because of shortness of breath and orthopnea.  In the emergency department, the patient was noted to have atrial fibrillation with HR 100-110.  Chest x-ray showed increased interstitial prominence.  CBC, BMP, and troponins were unremarkable.  EKG showed atrial fibrillation with left bundle branch block.  BNP was 465.0.  The patient was started on intravenous furosemide.    Hospital Course:    Acute on Chronic Systolic CHF -Echo from December 2018 reviewed with ejection fraction of 30-35%. -Cardiology has recommended transitioning to PO lasix 60 mg BID. -Patient is 2 L negative since admission -Do not believe weights have been documented accordingly: Filed Weights   02/26/17 1644 02/27/17 0700 02/28/17 0500  Weight: 100.3 kg (221 lb 1.9 oz) 99.3 kg (218 lb 14.7 oz) 99.5 kg (219 lb 5.7 oz)         -Continue metoprolol (at higher dose for better rate control), losartan has been discontinued due to soft blood pressures.  Persistent atrial fibrillation -Has unfortunately reverted back to atrial fibrillation. -Cardiology is recommending against flecainide given ischemic heart disease and against Tikosyn given history of prolonged QT.  Continue amiodarone for now. -Currently on Xarelto for anticoagulation given a chadsvasc score of 6. -Dose of Toprol-XL has been increased by cardiology in order to allow for better rate control. -Cardiology will arrange outpatient EP follow-up.  Coronary artery disease -status post bare-metal stent to RCA in 2014 -No active angina symptoms and troponin has been negative.  Deconditioning -Will be going to SNF upon discharge.  Hyperlipidemia -Continue statin     Procedures:  None   Consultations:  Cardiology  Discharge Instructions  Discharge Instructions    Diet - low sodium heart healthy   Complete by:  As directed    Increase activity slowly   Complete by:  As directed      Allergies as of 02/28/2017      Reactions   Sinus & Allergy [chlorpheniramine-phenylephrine] Shortness Of Breath   Demerol Rash   Penicillins Rash, Other (See Comments)   REACTION: rash, years ago Has patient had a  PCN reaction causing immediate rash, facial/tongue/throat swelling, SOB or lightheadedness with hypotension: Yes Has patient had a PCN reaction causing severe rash involving mucus membranes or skin necrosis: No Has patient had a  PCN reaction that required hospitalization No Has patient had a PCN reaction occurring within the last 10 years: No If all of the above answers are "NO", then may proceed with Cephalosporin use.      Medication List    STOP taking these medications   losartan 25 MG tablet Commonly known as:  COZAAR     TAKE these medications   amiodarone 200 MG tablet Commonly known as:  PACERONE Take 1 tablet (200 mg total) by mouth 2 (two) times daily.   ASPIRIN ADULT LOW STRENGTH 81 MG EC tablet Generic drug:  aspirin Take 81 mg by mouth daily.   atorvastatin 20 MG tablet Commonly known as:  LIPITOR TAKE ONE TABLET BY MOUTH AT BEDTIME   balsalazide 750 MG capsule Commonly known as:  COLAZAL TAKE THREE CAPSULES BY MOUTH THREE TIMES DAILY (REPLACING DELZICOL) What changed:  See the new instructions.   furosemide 20 MG tablet Commonly known as:  LASIX Take 3 tablets (60 mg total) by mouth 2 (two) times daily. What changed:    medication strength  how much to take  when to take this   ICAPS AREDS 2 PO Take 2 tablets by mouth daily at 6 PM.   metoprolol succinate 50 MG 24 hr tablet Commonly known as:  TOPROL-XL Take 1 tablet (50 mg total) by mouth 2 (two) times daily with a meal. Take with or immediately following a meal. What changed:  when to take this   nitroGLYCERIN 0.4 MG SL tablet Commonly known as:  NITROSTAT Place 1 tablet (0.4 mg total) under the tongue every 5 (five) minutes as needed for chest pain.   potassium chloride SA 20 MEQ tablet Commonly known as:  K-DUR,KLOR-CON Take 1 tablet (20 mEq total) by mouth 2 (two) times daily. What changed:  when to take this   THERATEARS ALLERGY 0.025 % ophthalmic solution Generic drug:  ketotifen Place 2 drops into both eyes 3 (three) times daily as needed (for dry eyes).   TYLENOL ARTHRITIS PAIN 650 MG CR tablet Generic drug:  acetaminophen Take 1,300 mg by mouth every 8 (eight) hours.   XARELTO 15 MG Tabs  tablet Generic drug:  Rivaroxaban TAKE ONE TABLET BY MOUTH EVERY DAY      Allergies  Allergen Reactions  . Sinus & Allergy [Chlorpheniramine-Phenylephrine] Shortness Of Breath  . Demerol Rash  . Penicillins Rash and Other (See Comments)    REACTION: rash, years ago Has patient had a PCN reaction causing immediate rash, facial/tongue/throat swelling, SOB or lightheadedness with hypotension: Yes Has patient had a PCN reaction causing severe rash involving mucus membranes or skin necrosis: No Has patient had a PCN reaction that required hospitalization No Has patient had a PCN reaction occurring within the last 10 years: No If all of the above answers are "NO", then may proceed with Cephalosporin use.    Contact information for after-discharge care    [facility] .   Service:  Skilled Nursing Contact information: [address] [place] [phone]               The results of significant diagnostics from this hospitalization (including imaging, microbiology, ancillary and laboratory) are listed below for reference.    Significant Diagnostic Studies: Dg  Chest 2 View  Result Date: 02/01/2017 CLINICAL DATA:  Chest pain and shortness of breath EXAM: CHEST  2 VIEW COMPARISON:  January 21, 2017 FINDINGS: There is interstitial pulmonary edema. There is no airspace consolidation. There is pulmonary venous hypertension with cardiomegaly. There is no evident adenopathy. There is degenerative change in each shoulder. There is superior migration of the right humeral head. IMPRESSION: Interstitial edema with cardiomegaly and pulmonary venous hypertension. There is likely a degree of congestive heart failure. No consolidation. No adenopathy. Degenerative change in each shoulder with probable chronic rotator cuff tear on the right. Electronically Signed   By: Lowella Grip III M.D.   On: 02/01/2017 13:40   Dg Chest Port 1  View  Result Date: 02/25/2017 CLINICAL DATA:  84 year old female admitted with shortness of breath, recurrent atrial fibrillation with RVR, acute on chronic systolic CHF. EXAM: PORTABLE CHEST 1 VIEW COMPARISON:  02/20/2017 and earlier. FINDINGS: Portable AP upright view at 1229 hrs. Stable cardiomegaly and mediastinal contours. Decreased bilateral pulmonary vascularity, now appearing near baseline. No pneumothorax, pleural effusion or confluent pulmonary opacity. Visualized tracheal air column is within normal limits. Advanced degenerative changes of the shoulders. IMPRESSION: 1. Resolved pulmonary interstitial edema. 2. No new cardiopulmonary abnormality. Electronically Signed   By: Genevie Ann M.D.   On: 02/25/2017 12:37   Dg Chest Port 1 View  Result Date: 02/20/2017 CLINICAL DATA:  Shortness of Breath, history of heart cath, hypertension EXAM: PORTABLE CHEST 1 VIEW COMPARISON:  02/01/2017 FINDINGS: Cardiomegaly. Mild peribronchial thickening and interstitial prominence. No confluent opacities or effusions. No acute bony abnormality. IMPRESSION: Cardiomegaly. Peribronchial thickening and interstitial prominence could reflect bronchitis or interstitial edema. Electronically Signed   By: Rolm Baptise M.D.   On: 02/20/2017 13:28    Microbiology: Recent Results (from the past 240 hour(s))  MRSA PCR Screening     Status: None   Collection Time: 02/21/17 10:04 PM  Result Value Ref Range Status   MRSA by PCR NEGATIVE NEGATIVE Final    Comment:        The GeneXpert MRSA Assay (FDA approved for NASAL specimens only), is one component of a comprehensive MRSA colonization surveillance program. It is not intended to diagnose MRSA infection nor to guide or monitor treatment for MRSA infections. Performed at [facility], [address], [place], [place] [address]      Labs: Basic Metabolic Panel: Recent Labs  Lab 02/23/17 0455 02/24/17 0506 02/25/17 0547 02/26/17 0427 02/28/17 0815  NA 136 136  138 138 137  K 3.6 3.6 3.5 3.5 3.5  CL 102 102 101 101 100*  CO2 22 24 25 25 26   GLUCOSE 101* 95 94 92 87  BUN 23* 24* 24* 24* 25*  CREATININE 0.90 0.88 0.80 0.80 0.94  CALCIUM 9.1 8.9 9.2 9.1 9.1  MG 1.9 2.0  --  1.8  --    Liver Function Tests: No results for input(s): AST, ALT, ALKPHOS, BILITOT, PROT, ALBUMIN in the last 168 hours. No results for input(s): LIPASE, AMYLASE in the last 168 hours. No results for input(s): AMMONIA in the last 168 hours. CBC: No results for input(s): WBC, NEUTROABS, HGB, HCT, MCV, PLT in the last 168 hours. Cardiac Enzymes: No results for input(s): CKTOTAL, CKMB, CKMBINDEX, TROPONINI in the last 168 hours. BNP: BNP (last 3 results) Recent Labs    02/01/17 1212 02/20/17 1253 02/24/17 0506  BNP 253.0* 465.0* 308.0*    ProBNP (last 3 results) No results for input(s): PROBNP in the last  8760 hours.  CBG: No results for input(s): GLUCAP in the last 168 hours.     Signed:  Lelon Frohlich  [facility] Pager: [phone] 02/28/2017, 12:31 PM

## 2017-03-01 ENCOUNTER — Encounter (HOSPITAL_COMMUNITY)
Admission: RE | Admit: 2017-03-01 | Discharge: 2017-03-01 | Disposition: A | Discharge status: Home / Self Care | Payer: Medicare Other | Source: Skilled Nursing Facility | Attending: Internal Medicine | Admitting: Internal Medicine

## 2017-03-01 ENCOUNTER — Non-Acute Institutional Stay (SKILLED_NURSING_FACILITY): Payer: Medicare Other | Admitting: Internal Medicine

## 2017-03-01 DIAGNOSIS — I481 Persistent atrial fibrillation: Secondary | ICD-10-CM | POA: Diagnosis not present

## 2017-03-01 DIAGNOSIS — R509 Fever, unspecified: Secondary | ICD-10-CM | POA: Diagnosis not present

## 2017-03-01 DIAGNOSIS — K5900 Constipation, unspecified: Secondary | ICD-10-CM | POA: Diagnosis not present

## 2017-03-01 DIAGNOSIS — I4891 Unspecified atrial fibrillation: Secondary | ICD-10-CM

## 2017-03-01 DIAGNOSIS — I5023 Acute on chronic systolic (congestive) heart failure: Secondary | ICD-10-CM | POA: Diagnosis not present

## 2017-03-01 DIAGNOSIS — J101 Influenza due to other identified influenza virus with other respiratory manifestations: Secondary | ICD-10-CM

## 2017-03-01 DIAGNOSIS — I2510 Atherosclerotic heart disease of native coronary artery without angina pectoris: Secondary | ICD-10-CM | POA: Diagnosis not present

## 2017-03-01 LAB — CBC WITH DIFFERENTIAL/PLATELET
Basophils Absolute: 0 10*3/uL (ref 0.0–0.1)
Basophils Relative: 1 %
EOS PCT: 5 %
Eosinophils Absolute: 0.3 10*3/uL (ref 0.0–0.7)
HCT: 38.9 % (ref 36.0–46.0)
Hemoglobin: 12 g/dL (ref 12.0–15.0)
LYMPHS ABS: 1 10*3/uL (ref 0.7–4.0)
LYMPHS PCT: 16 %
MCH: 25.3 pg — AB (ref 26.0–34.0)
MCHC: 30.8 g/dL (ref 30.0–36.0)
MCV: 82.1 fL (ref 78.0–100.0)
MONO ABS: 0.7 10*3/uL (ref 0.1–1.0)
Monocytes Relative: 11 %
Neutro Abs: 4.2 10*3/uL (ref 1.7–7.7)
Neutrophils Relative %: 67 %
PLATELETS: 313 10*3/uL (ref 150–400)
RBC: 4.74 MIL/uL (ref 3.87–5.11)
RDW: 15 % (ref 11.5–15.5)
WBC: 6.2 10*3/uL (ref 4.0–10.5)

## 2017-03-01 LAB — URINALYSIS, ROUTINE W REFLEX MICROSCOPIC
Bilirubin Urine: NEGATIVE
GLUCOSE, UA: NEGATIVE mg/dL
HGB URINE DIPSTICK: NEGATIVE
KETONES UR: NEGATIVE mg/dL
Leukocytes, UA: NEGATIVE
Nitrite: NEGATIVE
PROTEIN: NEGATIVE mg/dL
Specific Gravity, Urine: 1.015 (ref 1.005–1.030)
pH: 6 (ref 5.0–8.0)

## 2017-03-01 LAB — BASIC METABOLIC PANEL
Anion gap: 14 (ref 5–15)
BUN: 22 mg/dL — AB (ref 6–20)
CALCIUM: 9.2 mg/dL (ref 8.9–10.3)
CO2: 23 mmol/L (ref 22–32)
Chloride: 102 mmol/L (ref 101–111)
Creatinine, Ser: 0.87 mg/dL (ref 0.44–1.00)
GFR calc Af Amer: >60 mL/min (ref >60)
GFR, EST NON AFRICAN AMERICAN: 59 mL/min — AB (ref >60)
GLUCOSE: 94 mg/dL (ref 65–99)
Potassium: 3.2 mmol/L — ABNORMAL LOW (ref 3.5–5.1)
Sodium: 139 mmol/L (ref 135–145)

## 2017-03-01 LAB — INFLUENZA PANEL BY PCR (TYPE A & B)
Influenza A By PCR: POSITIVE — AB
Influenza B By PCR: NEGATIVE

## 2017-03-01 NOTE — Progress Notes (Signed)
This is an acute visit.  Level care skilled.  Facility is [facility].  Chief complaint-acute visit status post hospitalization for CHF.  History of present illness.  Patient is a pleasant 84 year old female with a history of atrial fibrillation cardiomyopathy hyperlipidemia hypertension as well as depression- she presented to the hospital with increased shortness of breath and edema-she had previously been admitted to the hospital in late January for A. fib with rapid ventricular response- she underwent a TEE-DC CV on January 28.  Marland Kitchen  Previous to this hospitalization patient had sleep in a recliner shortness of breath.  Was diagnosed on admission with acute on chronic systolic CHF.  An echo done in December 2018 showed an ejection fraction of 30-35%.  She has been discharged on Lasix 60 mg twice daily he says her breathing has improved--she does have a cough but says that she was told this helps to clear her lungs.  In regards to atrial fibrillation-cardiology recommended continuing amiodarone for now- he is on Xarelto for anticoagulation she is also on Toprol-XL which was increased.  He will need follow-up by cardiology.  Regards coronary artery disease she is status post a stent to the RCA in 2014- troponins were negative in the hospital she was symptom-free.  She is on a statin.  Currently she has no acute complaints.--She did say she had some nausea earlier today but is not currently complaining of that she has been drinking some ginger ale  Past Medical History:  Diagnosis Date  . Arthritis   . Atrial flutter ([place])   . Coronary atherosclerosis of native coronary artery    BMS RCA May 2014 - [place] stent  . Depression   . Essential hypertension, benign   . Hyperlipemia   . Macular degeneration   . Paroxysmal atrial fibrillation (HCC)   . Secondary cardiomyopathy (HCC)    LVEF 35% - improved to 45-50%  . Ulcerative colitis         Past Surgical  History:  Procedure Laterality Date  . ABDOMINAL HYSTERECTOMY    . APPENDECTOMY    . Bilateral knee replacements      2007, 2008  . BLADDER SURGERY    . CARDIOVERSION N/A 02/04/2017   Procedure: CARDIOVERSION;  Surgeon: Arnoldo Lenis, MD;  [facility];  Service: Endoscopy;  Laterality: N/A;  . COLONOSCOPY N/A 06/15/2015   Procedure: COLONOSCOPY;  Surgeon: Rogene Houston, MD;  [facility];  Service: Endoscopy;  Laterality: N/A;  210  . CYSTOSCOPY W/ RETROGRADES Bilateral 05/14/2016   Procedure: CYSTOSCOPY WITH BILATERAL RETROGRADE PYELOGRAM;  Surgeon: Raynelle Bring, MD;  Location: [facility];  Service: Urology;  Laterality: Bilateral;  GENERAL ANESTHESIA WITH PARALYSIS  . LEFT HEART CATHETERIZATION WITH CORONARY ANGIOGRAM N/A 10/30/2013   Procedure: LEFT HEART CATHETERIZATION WITH CORONARY ANGIOGRAM;  Surgeon: Burnell Blanks, MD;  Location: [facility] CATH LAB;  Service: Cardiovascular;  Laterality: N/A;  . TEE WITHOUT CARDIOVERSION N/A 02/04/2017   Procedure: TRANSESOPHAGEAL ECHOCARDIOGRAM (TEE) WITH PROPOL;  Surgeon: Arnoldo Lenis, MD;  [facility];  Service: Endoscopy;  Laterality: N/A;  . TONSILLECTOMY    . TOTAL KNEE ARTHROPLASTY    . TRANSURETHRAL RESECTION OF BLADDER TUMOR N/A 05/14/2016   Procedure: TRANSURETHRAL RESECTION OF BLADDER TUMOR (TURBT);  Surgeon: Raynelle Bring, MD;  [facility];  Service: Urology;  Laterality: N/A;  GENERAL ANESTHESIA WITH PARALYSIS  . YAG LASER APPLICATION Bilateral 78/09/3808   Procedure: YAG LASER APPLICATION;  Surgeon: Williams Che,  MD;  Location: [facility];  Service: Ophthalmology;  Laterality: Bilateral;   Social History:  reports that  has never smoked. she has never used smokeless tobacco. She reports that she does not drink alcohol or use drugs.   History reviewed. No pertinent family history.        Allergies  Allergen Reactions  . Sinus & Allergy  [Chlorpheniramine-Phenylephrine] Shortness Of Breath  . Demerol Rash  . Penicillins Rash and Other (See Comments)    REACTION: rash, years ago Has patient had a PCN reaction causing immediate rash, facial/tongue/throat swelling, SOB or lightheadedness with hypotension: Yes Has patient had a PCN reaction causing severe rash involving mucus membranes or skin necrosis: No Has patient had a PCN reaction that required hospitalization No Has patient had a PCN reaction occurring within the last 10 years: No If all of the above answers are "NO", then may proceed with Cephalosporin use.              amiodarone 200 MG tablet Commonly known as:  PACERONE Take 1 tablet (200 mg total) by mouth 2 (two) times daily.   ASPIRIN ADULT LOW STRENGTH 81 MG EC tablet Generic drug:  aspirin Take 81 mg by mouth daily.   atorvastatin 20 MG tablet Commonly known as:  LIPITOR TAKE ONE TABLET BY MOUTH AT BEDTIME   balsalazide 750 MG capsule Commonly known as:  COLAZAL TAKE THREE CAPSULES BY MOUTH THREE TIMES DAILY (REPLACING DELZICOL) What changed:  See the new instructions.   furosemide 20 MG tablet Commonly known as:  LASIX Take 3 tablets (60 mg total) by mouth 2 (two) times daily. What changed:    medication strength  how much to take  when to take this   ICAPS AREDS 2 PO Take 2 tablets by mouth daily at 6 PM.   metoprolol succinate 50 MG 24 hr tablet Commonly known as:  TOPROL-XL Take 1 tablet (50 mg total) by mouth 2 (two) times daily with a meal. Take with or immediately following a meal. What changed:  when to take this   nitroGLYCERIN 0.4 MG SL tablet Commonly known as:  NITROSTAT Place 1 tablet (0.4 mg total) under the tongue every 5 (five) minutes as needed for chest pain.   potassium chloride SA 20 MEQ tablet Commonly known as:  K-DUR,KLOR-CON Take 1 tablet (20 mEq total) by mouth 2 (two) times daily. What changed:  when to take this   THERATEARS ALLERGY  0.025 % ophthalmic solution Generic drug:  ketotifen Place 2 drops into both eyes 3 (three) times daily as needed (for dry eyes).   TYLENOL ARTHRITIS PAIN 650 MG CR tablet Generic drug:  acetaminophen Take 1,300 mg by mouth every 8 (eight) hours.   XARELTO 15 MG Tabs tablet Generic drug:  Rivaroxaban TAKE ONE TABLET BY MOUTH EVERY DAY                                   Review of systems.  In general she is not complaining of any fever or chills currently.  Skin does not complain of rashes or itching.  Head ears eyes nose mouth and throat does not complain of sore throat or visual changes.  Respiratory has a cough- but does not complain of shortness of breath beyond baseline says this has improved during hospitalization.  Cardiac is not complaining of any chest pain or palpitations has mild lower extremity edema.  GI  complained of nausea earlier today currently drinking ginger ale apparently this is helped does not complain of vomiting diarrhea --says she has not had a bowel movement in several days  GU does not complain of dysuria.  Musculoskeletal has weakness but is not really complaining of joint pain today.  Neurologic does not complain of headache dizziness or syncope.  Psych does not complain of overt anxiety or depression appears to be in good spirits.  Physical exam. Temperature is pending pulse of 80 respirations of 19 blood pressure 122/76 weight is 213.8  In general this is a pleasant elderly female in no distress lying comfortably in bed.  Her skin is warm and dry.  Eyes she has prescription lenses visual acuity appears grossly intact.  Oropharynx is clear mucous membranes moist.  Heart is somewhat distant heart sounds irregular irregular rate and rhythm she has mild lower extremity edema pedal pulses are intact bilaterally.  Her abdomen is somewhat obese soft nontender with positive bowel sounds.  Musculoskeletal Limited exam since she is  in bed but is able to move all extremities x4.  Neurologic is grossly intact her speech is clear no lateralizing findings.  Psych she is alert and oriented pleasant and appropriate.  Labs.  March 01, 2017.  Sodium 139 potassium 3.2 BUN 22 creatinine 0.87.  WBC 6.2 hemoglobin 12.0 platelets 313.  February 26, 2017.  Magnesium 1.8.  January 02, 2017.  Liver function tests within normal limits her graph assessment and plan.  1.  History of systolic CHF with ejection fraction of 30-35% she is on 60 mg twice daily of Lasix-will order weights daily-notify provider if gain greater than 3 pounds.  I do note she has mild hypokalemia at 3.2 today will increase her potassium dose up to 40 mg x2 doses and then start 30 mg p.o. twice daily- will need an updated BMP on Monday, February 25.  .  2.  History of atrial fibrillation this appears rate controlled currently on amiodarone as well as Toprol which was increased in the hospital-she is on Xarelto for anticoagulation.  3.-  History of coronary artery disease status post stenting in 2014- troponins were negative in the hospital she is on Xarelto she is also on statin and aspirin at this point appears to be asymptomatic.  4.-History of constipation-?  Will order an abdominal x-ray continue to monitor she may need additional laxative.  5.-History of hypertension-this appears stable again with fairly minimal readings will continue to monitor again she is on Lopressor.  6.  History of ulcerative colitis-she is on Colazal- will monitor for now again will get an abdominal x-ray because of complaints of constipation.  Addendum-updated vital signs show show her temperature has risen to 100.3 apparently she was afebrile earlier today.  She does have a history of cough-per discussion with the patient her daughter who was with her a lot in the hospital did develop a cough apparently with fever and suspect she has the flu although there has not  been confirmation of that-however this history is concerning enough with the elevated temperature to obtain a flu swab--also will obtain a chest x-ray-as well as a urinalysis and culture secondary to fever of unknown origin and will update a CBC with differential tomorrow.  Also monitor for now vital signs pulse ox every 4 hours overnight.  Addendum we have obtained the results of the flu swab and it actually is positive for influenza A will treat with Tamiflu 75 mg twice daily for 5  days-she is in a private room--at this point again will monitor  CPT- [phone]- of note greater than 50 minutes spent assessing patient-reviewing her chart-reviewing her labs-coordinating and formulating a plan of care for numerous diagnoses- of note greater than 50% of time spent coordinating plan of care

## 2017-03-02 ENCOUNTER — Encounter (HOSPITAL_COMMUNITY)
Admission: AD | Admit: 2017-03-02 | Discharge: 2017-03-02 | Disposition: A | Discharge status: Home / Self Care | Payer: Medicare Other | Source: Skilled Nursing Facility

## 2017-03-02 ENCOUNTER — Encounter: Payer: Self-pay | Admitting: Internal Medicine

## 2017-03-02 LAB — CBC WITH DIFFERENTIAL/PLATELET
BASOS PCT: 0 %
Basophils Absolute: 0 10*3/uL (ref 0.0–0.1)
EOS ABS: 0 10*3/uL (ref 0.0–0.7)
EOS PCT: 0 %
HCT: 38.2 % (ref 36.0–46.0)
HEMOGLOBIN: 12.1 g/dL (ref 12.0–15.0)
Lymphocytes Relative: 14 %
Lymphs Abs: 1 10*3/uL (ref 0.7–4.0)
MCH: 26 pg (ref 26.0–34.0)
MCHC: 31.7 g/dL (ref 30.0–36.0)
MCV: 82 fL (ref 78.0–100.0)
Monocytes Absolute: 1 10*3/uL (ref 0.1–1.0)
Monocytes Relative: 14 %
NEUTROS PCT: 72 %
Neutro Abs: 4.8 10*3/uL (ref 1.7–7.7)
PLATELETS: 306 10*3/uL (ref 150–400)
RBC: 4.66 MIL/uL (ref 3.87–5.11)
RDW: 15.1 % (ref 11.5–15.5)
WBC: 6.7 10*3/uL (ref 4.0–10.5)

## 2017-03-03 LAB — URINE CULTURE

## 2017-03-04 ENCOUNTER — Encounter (HOSPITAL_COMMUNITY)
Admission: RE | Admit: 2017-03-04 | Discharge: 2017-03-04 | Disposition: A | Discharge status: Home / Self Care | Payer: Medicare Other | Source: Skilled Nursing Facility | Attending: *Deleted | Admitting: *Deleted

## 2017-03-04 ENCOUNTER — Non-Acute Institutional Stay (SKILLED_NURSING_FACILITY): Payer: Medicare Other | Admitting: Internal Medicine

## 2017-03-04 ENCOUNTER — Encounter: Payer: Self-pay | Admitting: Internal Medicine

## 2017-03-04 DIAGNOSIS — J101 Influenza due to other identified influenza virus with other respiratory manifestations: Secondary | ICD-10-CM | POA: Diagnosis not present

## 2017-03-04 DIAGNOSIS — I5021 Acute systolic (congestive) heart failure: Secondary | ICD-10-CM

## 2017-03-04 DIAGNOSIS — K5100 Ulcerative (chronic) pancolitis without complications: Secondary | ICD-10-CM

## 2017-03-04 DIAGNOSIS — I2510 Atherosclerotic heart disease of native coronary artery without angina pectoris: Secondary | ICD-10-CM

## 2017-03-04 DIAGNOSIS — I429 Cardiomyopathy, unspecified: Secondary | ICD-10-CM | POA: Diagnosis not present

## 2017-03-04 DIAGNOSIS — I481 Persistent atrial fibrillation: Secondary | ICD-10-CM

## 2017-03-04 DIAGNOSIS — I4891 Unspecified atrial fibrillation: Secondary | ICD-10-CM

## 2017-03-04 DIAGNOSIS — I4819 Other persistent atrial fibrillation: Secondary | ICD-10-CM

## 2017-03-04 DIAGNOSIS — E782 Mixed hyperlipidemia: Secondary | ICD-10-CM

## 2017-03-04 LAB — BASIC METABOLIC PANEL
Anion gap: 15 (ref 5–15)
BUN: 31 mg/dL — AB (ref 6–20)
CHLORIDE: 102 mmol/L (ref 101–111)
CO2: 24 mmol/L (ref 22–32)
Calcium: 8.5 mg/dL — ABNORMAL LOW (ref 8.9–10.3)
Creatinine, Ser: 1.06 mg/dL — ABNORMAL HIGH (ref 0.44–1.00)
GFR calc Af Amer: 54 mL/min — ABNORMAL LOW (ref >60)
GFR calc non Af Amer: 47 mL/min — ABNORMAL LOW (ref >60)
Glucose, Bld: 77 mg/dL (ref 65–99)
POTASSIUM: 3.2 mmol/L — AB (ref 3.5–5.1)
Sodium: 141 mmol/L (ref 135–145)

## 2017-03-04 NOTE — Progress Notes (Signed)
Provider: Veleta Miners  Location:   [place] Room Number: 119/P Place of Service:  SNF (31)  PCP: Rory Percy, MD Patient Care Team: Rory Percy, MD as PCP - General (Unknown Physician Specialty) Satira Sark, MD as PCP - Cardiology (Cardiology) System, Provider Not In as Consulting Physician (Cardiology) Satira Sark, MD as Consulting Physician (Cardiology)  Extended Emergency Contact Information Primary Emergency Contact: Pamala Hurry [place] Phone: [phone] Relation: Daughter Secondary Emergency Contact: Moore,Brenda Address: [address], [place] [address] [place] Phone: [phone] Mobile Phone: [phone] Relation: Sister  Code Status: DNR Goals of Care: Advanced Directive information Advanced Directives 03/04/2017  Does Patient Have a Medical Advance Directive? Yes  Type of Advance Directive Out of facility DNR (pink MOST or yellow form)  Does patient want to make changes to medical advance directive? No - Patient declined  Copy of [place] in Chart? No - copy requested  Would patient like information on creating a medical advance directive? No - Patient declined      Chief Complaint  Patient presents with  . New Admit To SNF    New Admission Visit    HPI: Patient is a 84 y.o. female seen today for admission to facility For therapy after being in hospital from 02/13-02/21  Patient has h/o Atrial Fibrillation,Underwent DC Cardioversion in 01/28  Cardiomyopathy with EF of 35%,CAD s/p BMS to RCA on 2014 Hyperlipidemia, Hypertension Depression, and Ulcerative Pancolitis. She was discharged home on 01/29 after successfully converting into Sinus rhythm. But at home got progressively SOB and was found in CHF with Rapid Atrial fibrillation. Was again went Cardioversion on 02/ But unfortunately was back in Atrial fibrillation. Per their notes she is not  candidate for Flecainide Or Tikosyn due to her Cardiomyopathy. Patient was diuresed and was sent to facility for therapy. In the facility patient had low grade fever and her Influenza swab was positive.And now she is in Isolation on Tamiflu for 5 days. She says she does have cough but no fever. She denies any SOB or Chest pain. No Palpitations. She lives with her husband. Was very active before these recurrent hospitalizations. Plan to soon go home..   Past Medical History:  Diagnosis Date  . Arthritis   . Atrial flutter ([place])   . Coronary atherosclerosis of native coronary artery    BMS RCA May 2014 - [place] stent  . Depression   . Essential hypertension, benign   . Hyperlipemia   . Macular degeneration   . Paroxysmal atrial fibrillation (HCC)   . Secondary cardiomyopathy (HCC)    LVEF 35% - improved to 45-50%  . Ulcerative colitis    Past Surgical History:  Procedure Laterality Date  . ABDOMINAL HYSTERECTOMY    . APPENDECTOMY    . Bilateral knee replacements      2007, 2008  . BLADDER SURGERY    . CARDIOVERSION N/A 02/04/2017   Procedure: CARDIOVERSION;  Surgeon: Arnoldo Lenis, MD;  [facility];  Service: Endoscopy;  Laterality: N/A;  . CARDIOVERSION N/A 02/26/2017   Procedure: CARDIOVERSION;  Surgeon: Satira Sark, MD;  Location: [facility];  Service: Cardiovascular;  Laterality: N/A;  . COLONOSCOPY N/A 06/15/2015   Procedure: COLONOSCOPY;  Surgeon: Rogene Houston, MD;  [facility];  Service: Endoscopy;  Laterality: N/A;  210  . CYSTOSCOPY W/ RETROGRADES Bilateral 05/14/2016  Procedure: CYSTOSCOPY WITH BILATERAL RETROGRADE PYELOGRAM;  Surgeon: Raynelle Bring, MD;  [facility];  Service: Urology;  Laterality: Bilateral;  GENERAL ANESTHESIA WITH PARALYSIS  . LEFT HEART CATHETERIZATION WITH CORONARY ANGIOGRAM N/A 10/30/2013   Procedure: LEFT HEART CATHETERIZATION WITH CORONARY ANGIOGRAM;  Surgeon: Burnell Blanks, MD;  Location: [facility] CATH  LAB;  Service: Cardiovascular;  Laterality: N/A;  . TEE WITHOUT CARDIOVERSION N/A 02/04/2017   Procedure: TRANSESOPHAGEAL ECHOCARDIOGRAM (TEE) WITH PROPOL;  Surgeon: Arnoldo Lenis, MD;  [facility];  Service: Endoscopy;  Laterality: N/A;  . TONSILLECTOMY    . TOTAL KNEE ARTHROPLASTY    . TRANSURETHRAL RESECTION OF BLADDER TUMOR N/A 05/14/2016   Procedure: TRANSURETHRAL RESECTION OF BLADDER TUMOR (TURBT);  Surgeon: Raynelle Bring, MD;  [facility];  Service: Urology;  Laterality: N/A;  GENERAL ANESTHESIA WITH PARALYSIS  . YAG LASER APPLICATION Bilateral 33/05/4560   Procedure: YAG LASER APPLICATION;  Surgeon: Williams Che, MD;  Location: [facility];  Service: Ophthalmology;  Laterality: Bilateral;    reports that  has never smoked. she has never used smokeless tobacco. She reports that she does not drink alcohol or use drugs. Social History   Socioeconomic History  . Marital status: Married    Spouse name: Not on file  . Number of children: Not on file  . Years of education: Not on file  . Highest education level: Not on file  Social Needs  . Financial resource strain: Not on file  . Food insecurity - worry: Not on file  . Food insecurity - inability: Not on file  . Transportation needs - medical: Not on file  . Transportation needs - non-medical: Not on file  Occupational History  . Not on file  Tobacco Use  . Smoking status: Never Smoker  . Smokeless tobacco: Never Used  Substance and Sexual Activity  . Alcohol use: No    Alcohol/week: 0.0 oz  . Drug use: No  . Sexual activity: Not Currently  Other Topics Concern  . Not on file  Social History Narrative  . Not on file    Functional Status Survey:    Family History  Problem Relation Age of Onset  . CAD Father     Health Maintenance  Topic Date Due  . INFLUENZA VACCINE  04/01/2017 (Originally 08/08/2016)  . DEXA SCAN  04/01/2017 (Originally 04/16/1997)  . TETANUS/TDAP  04/01/2017 (Originally 04/17/1949)   . PNA vac Low Risk Adult (1 of 2 - PCV13) 04/01/2017 (Originally 04/16/1997)    Allergies  Allergen Reactions  . Sinus & Allergy [Chlorpheniramine-Phenylephrine] Shortness Of Breath  . Demerol Rash  . Penicillins Rash and Other (See Comments)    REACTION: rash, years ago Has patient had a PCN reaction causing immediate rash, facial/tongue/throat swelling, SOB or lightheadedness with hypotension: Yes Has patient had a PCN reaction causing severe rash involving mucus membranes or skin necrosis: No Has patient had a PCN reaction that required hospitalization No Has patient had a PCN reaction occurring within the last 10 years: No If all of the above answers are "NO", then may proceed with Cephalosporin use.     Outpatient Encounter Medications as of 03/04/2017  Medication Sig  . acetaminophen (TYLENOL ARTHRITIS PAIN) 650 MG CR tablet Take 1,300 mg by mouth every 8 (eight) hours.   Marland Kitchen amiodarone (PACERONE) 200 MG tablet Take 1 tablet (200 mg total) by mouth 2 (two) times daily.  . ASPIRIN ADULT LOW STRENGTH 81 MG EC tablet Take 81 mg by mouth  daily.  . atorvastatin (LIPITOR) 20 MG tablet TAKE ONE TABLET BY MOUTH AT BEDTIME  . balsalazide (COLAZAL) 750 MG capsule TAKE THREE CAPSULES BY MOUTH THREE TIMES DAILY (REPLACING DELZICOL)  . fluticasone (FLONASE) 50 MCG/ACT nasal spray Place 1 spray into both nostrils 2 (two) times daily. For 5 days  . furosemide (LASIX) 20 MG tablet Take 3 tablets (60 mg total) by mouth 2 (two) times daily.  Marland Kitchen ketotifen (THERATEARS ALLERGY) 0.025 % ophthalmic solution Place 2 drops into both eyes 3 (three) times daily as needed (for dry eyes).   . metoprolol succinate (TOPROL-XL) 50 MG 24 hr tablet Take 1 tablet (50 mg total) by mouth 2 (two) times daily with a meal. Take with or immediately following a meal.  . Multiple Vitamins-Minerals (ICAPS AREDS 2 PO) Take 2 tablets by mouth daily at 6 PM.   . nitroGLYCERIN (NITROSTAT) 0.4 MG SL tablet Place 1 tablet (0.4 mg  total) under the tongue every 5 (five) minutes as needed for chest pain.  Marland Kitchen ondansetron (ZOFRAN) 4 MG tablet Take 4 mg by mouth every 8 (eight) hours as needed for nausea or vomiting.  Marland Kitchen oseltamivir (TAMIFLU) 75 MG capsule Take 75 mg by mouth 2 (two) times daily.  . potassium chloride SA (K-DUR,KLOR-CON) 20 MEQ tablet Take 1 tablet (20 mEq total) by mouth 2 (two) times daily.  Alveda Reasons 15 MG TABS tablet TAKE ONE TABLET BY MOUTH EVERY DAY   No facility-administered encounter medications on file as of 03/04/2017.      Review of Systems  Review of Systems  Constitutional: Negative for activity change, appetite change, chills, diaphoresis, fatigue and fever.  HENT: Negative for mouth sores, postnasal drip, rhinorrhea, sinus pain and sore throat.   Respiratory: Negative for apnea, cough, chest tightness, shortness of breath and wheezing.   Cardiovascular: Negative for chest pain, palpitations and leg swelling.  Gastrointestinal: Negative for abdominal distention, abdominal pain, constipation, diarrhea, nausea and vomiting.  Genitourinary: Negative for dysuria and frequency.  Musculoskeletal: Negative for arthralgias, joint swelling and myalgias.  Skin: Negative for rash.  Neurological: Negative for dizziness, syncope, weakness, light-headedness and numbness.  Psychiatric/Behavioral: Negative for behavioral problems, confusion and sleep disturbance.     Vitals:   03/04/17 1036  BP: 107/71  Pulse: (!) 58  Resp: 20  Temp: 97.7 F (36.5 C)  TempSrc: Oral  SpO2: 98%   There is no height or weight on file to calculate BMI. Physical Exam  Constitutional: She is oriented to person, place, and time. She appears well-developed and well-nourished.  HENT:  Head: Normocephalic.  Dry with Positive for thrush  Eyes: Pupils are equal, round, and reactive to light.  Neck: Neck supple.  Cardiovascular: Normal rate and normal heart sounds. An irregular rhythm present.  Pulmonary/Chest: Effort  normal and breath sounds normal. No respiratory distress. She has no wheezes. She has no rales.  Abdominal: Soft. Bowel sounds are normal. She exhibits no distension. There is no tenderness. There is no rebound.  Musculoskeletal: She exhibits no edema.  Lymphadenopathy:    She has no cervical adenopathy.  Neurological: She is alert and oriented to person, place, and time.  No focal deficits  Skin: Skin is warm and dry.  Psychiatric: She has a normal mood and affect. Her behavior is normal. Thought content normal.    Labs reviewed: Basic Metabolic Panel: Recent Labs    02/23/17 0455 02/24/17 0506  02/26/17 0427 02/28/17 0815 03/01/17 0725 03/04/17 0300  NA 136 136   < >  138 137 139 141  K 3.6 3.6   < > 3.5 3.5 3.2* 3.2*  CL 102 102   < > 101 100* 102 102  CO2 22 24   < > 25 26 23 24   GLUCOSE 101* 95   < > 92 87 94 77  BUN 23* 24*   < > 24* 25* 22* 31*  CREATININE 0.90 0.88   < > 0.80 0.94 0.87 1.06*  CALCIUM 9.1 8.9   < > 9.1 9.1 9.2 8.5*  MG 1.9 2.0  --  1.8  --   --   --    < > = values in this interval not displayed.   Liver Function Tests: Recent Labs    01/02/17 1051 02/01/17 1212  AST 22 18  ALT 13* 12*  ALKPHOS 56 51  BILITOT 0.8 1.2  PROT 7.0 6.5  ALBUMIN 3.6 3.5   Recent Labs    02/01/17 1212  LIPASE 30   No results for input(s): AMMONIA in the last 8760 hours. CBC: Recent Labs    02/20/17 1253 03/01/17 0725 03/02/17 0440  WBC 7.4 6.2 6.7  NEUTROABS 4.9 4.2 4.8  HGB 11.5* 12.0 12.1  HCT 36.6 38.9 38.2  MCV 82.6 82.1 82.0  PLT 331 313 306   Cardiac Enzymes: Recent Labs    02/20/17 1253 02/20/17 1704 02/21/17 0004  TROPONINI <0.03 <0.03 <0.03   BNP: Invalid input(s): POCBNP No results found for: HGBA1C Lab Results  Component Value Date   TSH 1.783 02/01/2017   No results found for: VITAMINB12 No results found for: FOLATE No results found for: IRON, TIBC, FERRITIN  Imaging and Procedures obtained prior to SNF admission: No  results found.  Assessment/Plan Persistent atrial fibrillation On Amiodarone and Lopressor Follow up with EP cardiology On Xarelto  Influenza A Tolerating Tamiflu   Acute on Chronic systolic CHF  On Higher dose of lasix Will continue to do daily weights Follow up BMP Losartan was discontinued low BP.   Ulcerative pancolitis Symptoms controlled on Colazal   hyperlipidemia On Lipitor  CAD On statin and Beta blocker.and aspirin  Hypokalemia Potassium Supplemented Disposition Home with her husband   Family/ staff Communication:   Labs/tests ordered:  Total time spent in this patient care encounter was 45_ minutes; greater than 50% of the visit spent counseling patient, reviewing records , Labs and coordinating care for problems addressed at this encounter.

## 2017-03-05 DIAGNOSIS — J101 Influenza due to other identified influenza virus with other respiratory manifestations: Secondary | ICD-10-CM | POA: Insufficient documentation

## 2017-03-06 ENCOUNTER — Telehealth (INDEPENDENT_AMBULATORY_CARE_PROVIDER_SITE_OTHER): Payer: Self-pay | Admitting: *Deleted

## 2017-03-06 NOTE — Telephone Encounter (Signed)
I advised patient to take pill bottle over the [facility] and show them that she takes this medicine daily for her UC

## 2017-03-06 NOTE — Telephone Encounter (Signed)
No answer at home #

## 2017-03-06 NOTE — Telephone Encounter (Signed)
Patient's husband walked in to the office today stating patient is in the hospital and she needs her medication that is 3 yellow capsul's that clams her stomach and he is unsure of the name of this medication. Husband states that the hospital stated they do not have that medication. Patient's husband is requesting Dr Laural Golden to do something about this. Please advise [phone]  Patient last seen Terri for a office visit.

## 2017-03-07 ENCOUNTER — Encounter (HOSPITAL_COMMUNITY)
Admission: RE | Admit: 2017-03-07 | Discharge: 2017-03-07 | Disposition: A | Discharge status: Home / Self Care | Payer: Medicare Other | Source: Skilled Nursing Facility | Attending: Internal Medicine | Admitting: Internal Medicine

## 2017-03-07 DIAGNOSIS — K5120 Ulcerative (chronic) proctitis without complications: Secondary | ICD-10-CM | POA: Insufficient documentation

## 2017-03-07 DIAGNOSIS — R509 Fever, unspecified: Secondary | ICD-10-CM | POA: Diagnosis not present

## 2017-03-07 DIAGNOSIS — R600 Localized edema: Secondary | ICD-10-CM | POA: Insufficient documentation

## 2017-03-07 DIAGNOSIS — J101 Influenza due to other identified influenza virus with other respiratory manifestations: Secondary | ICD-10-CM | POA: Diagnosis not present

## 2017-03-07 LAB — BASIC METABOLIC PANEL
Anion gap: 13 (ref 5–15)
BUN: 25 mg/dL — AB (ref 6–20)
CHLORIDE: 104 mmol/L (ref 101–111)
CO2: 23 mmol/L (ref 22–32)
CREATININE: 1.11 mg/dL — AB (ref 0.44–1.00)
Calcium: 8.8 mg/dL — ABNORMAL LOW (ref 8.9–10.3)
GFR calc Af Amer: 51 mL/min — ABNORMAL LOW (ref >60)
GFR calc non Af Amer: 44 mL/min — ABNORMAL LOW (ref >60)
Glucose, Bld: 94 mg/dL (ref 65–99)
Potassium: 3.8 mmol/L (ref 3.5–5.1)
Sodium: 140 mmol/L (ref 135–145)

## 2017-03-08 ENCOUNTER — Non-Acute Institutional Stay (SKILLED_NURSING_FACILITY): Payer: Medicare Other | Admitting: Internal Medicine

## 2017-03-08 ENCOUNTER — Encounter: Payer: Self-pay | Admitting: Internal Medicine

## 2017-03-08 DIAGNOSIS — I5023 Acute on chronic systolic (congestive) heart failure: Secondary | ICD-10-CM

## 2017-03-08 DIAGNOSIS — I4891 Unspecified atrial fibrillation: Secondary | ICD-10-CM | POA: Diagnosis not present

## 2017-03-08 DIAGNOSIS — J101 Influenza due to other identified influenza virus with other respiratory manifestations: Secondary | ICD-10-CM

## 2017-03-08 DIAGNOSIS — K5100 Ulcerative (chronic) pancolitis without complications: Secondary | ICD-10-CM

## 2017-03-08 DIAGNOSIS — I2510 Atherosclerotic heart disease of native coronary artery without angina pectoris: Secondary | ICD-10-CM

## 2017-03-08 DIAGNOSIS — E876 Hypokalemia: Secondary | ICD-10-CM

## 2017-03-08 NOTE — Progress Notes (Signed)
Location:   [facility] Room Number: 035/W Place of Service:  SNF (31)  Provider: Granville Lewis  PCP: Rory Percy, MD Patient Care Team: Rory Percy, MD as PCP - General (Unknown Physician Specialty) Satira Sark, MD as PCP - Cardiology (Cardiology) System, Provider Not In as Consulting Physician (Cardiology) Satira Sark, MD as Consulting Physician (Cardiology)  Extended Emergency Contact Information Primary Emergency Contact: Henreitta Cea of [place] Phone: [phone] Relation: Daughter Secondary Emergency Contact: Moore,Brenda Address: [address], [place] [address] Johnnette Litter of [place] Phone: [phone] Mobile Phone: [phone] Relation: Sister  Code Status: Full Code Goals of care:  Advanced Directive information Advanced Directives 03/08/2017  Does Patient Have a Medical Advance Directive? Yes  Type of Advance Directive (No Data)  Does patient want to make changes to medical advance directive? No - Patient declined  Copy of [place] in Chart? No - copy requested  Would patient like information on creating a medical advance directive? No - Patient declined     Allergies  Allergen Reactions  . Sinus & Allergy [Chlorpheniramine-Phenylephrine] Shortness Of Breath  . Demerol Rash  . Penicillins Rash and Other (See Comments)    REACTION: rash, years ago Has patient had a PCN reaction causing immediate rash, facial/tongue/throat swelling, SOB or lightheadedness with hypotension: Yes Has patient had a PCN reaction causing severe rash involving mucus membranes or skin necrosis: No Has patient had a PCN reaction that required hospitalization No Has patient had a PCN reaction occurring within the last 10 years: No If all of the above answers are "NO", then may proceed with Cephalosporin use.     Chief Complaint  Patient presents with  . Discharge Note    Discharge Visit     HPI:  84 y.o. female seen today for discharge from facility.  She has a history of atrial fibrillation on Xarelto underwent cardioversion on February 04, 2017.         She also has CHF with an ejection fraction of 35% as well as coronary artery disease hyperlipidemia hypertension depression and ulcerative pancolitis.  She was initially discharged home on January 29 after successfully converting into sinus rhythm but at home she got progressively short of breath and was found to be in CHF with rapid A. fib.  She underwent another cardioversion upon hospitalization but was back in atrial fibrillation.  She was not thought to be a candidate for Flecainide  Or Tikosyn secondary to her cardiomyopathy.  She was diuresed and has come here for therapy.  Initially when she came to facility she was noted to have a low-grade fever influenza swab was positive and she has completed a 5-day course of Tamiflu- she has no longer had a fever in fact she denies any chills or aches-thinks she has been relatively asymptomatic except for a cough.  She is stated to nursing she would like to go home this weekend   Regards to her other medical issues these appear to be stable her weight has been stable on the Lasix 60 mg twice daily--.  Regards atrial fibrillation this appears rate controlled on amiodarone and Lopressor-she is on Xarelto for anticoagulation- I do note her blood pressures at times run somewhat low with systolically in the 70V to low 100s but she appears to be asymptomatic-I did do a manual reading sitting and it was 98/80-I did a standing one as well which  was 102/80.  She also has a history of ulcerative pancolitis which is been stable it appears on Colazal.  Regards coronary artery disease she is on a statin beta-blocker and aspirin and this appears to be asymptomatic.  She also had hypokalemia which is being supplemented potassium has normalized at 3.8 on most recent lab this will  need updating next week.  She would like to go home with her husband who is very supportive she does have a very supportive family and again she has completed her Tamiflu- she is complaining somewhat of a cough and will add Mucinex to make this more productive   Past Medical History:  Diagnosis Date  . Arthritis   . Atrial flutter ([facility])   . Coronary atherosclerosis of native coronary artery    BMS RCA May 2014 - Wylie stent  . Depression   . Essential hypertension, benign   . Hyperlipemia   . Macular degeneration   . Paroxysmal atrial fibrillation (H[facility])   . Secondary cardiomyopathy ([facility])    LVEF 35% - improved to 45-50%  . Ulcerative colitis     Past Surgical History:  Procedure Laterality Date  . ABDOMINAL HYSTERECTOMY    . APPENDECTOMY    . Bilateral knee replacements      2007, 2008  . BLADDER SURGERY    . CARDIOVERSION N/A 02/04/2017   Procedure: CARDIOVERSION;  Surgeon: Arnoldo Lenis, MD;  Location: [facility];  Service: Endoscopy;  Laterality: N/A;  . CARDIOVERSION N/A 02/26/2017   Procedure: CARDIOVERSION;  Surgeon: Satira Sark, MD;  Location: [facility];  Service: Cardiovascular;  Laterality: N/A;  . COLONOSCOPY N/A 06/15/2015   Procedure: COLONOSCOPY;  Surgeon: Rogene Houston, MD;  Location: [facility];  Service: Endoscopy;  Laterality: N/A;  210  . CYSTOSCOPY W/ RETROGRADES Bilateral 05/14/2016   Procedure: CYSTOSCOPY WITH BILATERAL RETROGRADE PYELOGRAM;  Surgeon: Raynelle Bring, MD;  Location: [facility];  Service: Urology;  Laterality: Bilateral;  GENERAL ANESTHESIA WITH PARALYSIS  . LEFT HEART CATHETERIZATION WITH CORONARY ANGIOGRAM N/A 10/30/2013   Procedure: LEFT HEART CATHETERIZATION WITH CORONARY ANGIOGRAM;  Surgeon: Burnell Blanks, MD;  Location: [facility] CATH LAB;  Service: Cardiovascular;  Laterality: N/A;  . TEE WITHOUT CARDIOVERSION N/A 02/04/2017   Procedure: TRANSESOPHAGEAL ECHOCARDIOGRAM (TEE) WITH PROPOL;  Surgeon: Arnoldo Lenis, MD;   [facility];  Service: Endoscopy;  Laterality: N/A;  . TONSILLECTOMY    . TOTAL KNEE ARTHROPLASTY    . TRANSURETHRAL RESECTION OF BLADDER TUMOR N/A 05/14/2016   Procedure: TRANSURETHRAL RESECTION OF BLADDER TUMOR (TURBT);  Surgeon: Raynelle Bring, MD;  Location: [facility];  Service: Urology;  Laterality: N/A;  GENERAL ANESTHESIA WITH PARALYSIS  . YAG LASER APPLICATION Bilateral 53/02/9922   Procedure: YAG LASER APPLICATION;  Surgeon: Williams Che, MD;  Location: [facility];  Service: Ophthalmology;  Laterality: Bilateral;      reports that  has never smoked. she has never used smokeless tobacco. She reports that she does not drink alcohol or use drugs. Social History   Socioeconomic History  . Marital status: Married    Spouse name: Not on file  . Number of children: Not on file  . Years of education: Not on file  . Highest education level: Not on file  Social Needs  . Financial resource strain: Not on file  . Food insecurity - worry: Not on file  . Food insecurity - inability: Not on file  . Transportation needs - medical: Not on file  .  Transportation needs - non-medical: Not on file  Occupational History  . Not on file  Tobacco Use  . Smoking status: Never Smoker  . Smokeless tobacco: Never Used  Substance and Sexual Activity  . Alcohol use: No    Alcohol/week: 0.0 oz  . Drug use: No  . Sexual activity: Not Currently  Other Topics Concern  . Not on file  Social History Narrative  . Not on file   Functional Status Survey:    Allergies  Allergen Reactions  . Sinus & Allergy [Chlorpheniramine-Phenylephrine] Shortness Of Breath  . Demerol Rash  . Penicillins Rash and Other (See Comments)    REACTION: rash, years ago Has patient had a PCN reaction causing immediate rash, facial/tongue/throat swelling, SOB or lightheadedness with hypotension: Yes Has patient had a PCN reaction causing severe rash involving mucus membranes or skin necrosis: No Has patient had a PCN  reaction that required hospitalization No Has patient had a PCN reaction occurring within the last 10 years: No If all of the above answers are "NO", then may proceed with Cephalosporin use.     Pertinent  Health Maintenance Due  Topic Date Due  . INFLUENZA VACCINE  04/01/2017 (Originally 08/08/2016)  . DEXA SCAN  04/01/2017 (Originally 04/16/1997)  . PNA vac Low Risk Adult (1 of 2 - PCV13) 04/01/2017 (Originally 04/16/1997)    Medications: Outpatient Encounter Medications as of 03/08/2017  Medication Sig  . acetaminophen (TYLENOL ARTHRITIS PAIN) 650 MG CR tablet Take 1,300 mg by mouth every 8 (eight) hours.   Marland Kitchen amiodarone (PACERONE) 200 MG tablet Take 1 tablet (200 mg total) by mouth 2 (two) times daily.  . ASPIRIN ADULT LOW STRENGTH 81 MG EC tablet Take 81 mg by mouth daily.  Marland Kitchen atorvastatin (LIPITOR) 20 MG tablet TAKE ONE TABLET BY MOUTH AT BEDTIME  . balsalazide (COLAZAL) 750 MG capsule TAKE THREE CAPSULES BY MOUTH THREE TIMES DAILY (REPLACING DELZICOL)  . furosemide (LASIX) 20 MG tablet Take 3 tablets (60 mg total) by mouth 2 (two) times daily.  Marland Kitchen ketotifen (THERATEARS ALLERGY) 0.025 % ophthalmic solution Place 2 drops into both eyes 3 (three) times daily as needed (for dry eyes).   . metoprolol succinate (TOPROL-XL) 50 MG 24 hr tablet Take 1 tablet (50 mg total) by mouth 2 (two) times daily with a meal. Take with or immediately following a meal.  . Multiple Vitamins-Minerals (ICAPS AREDS 2 PO) Take 2 tablets by mouth daily at 6 PM.   . nitroGLYCERIN (NITROSTAT) 0.4 MG SL tablet Place 1 tablet (0.4 mg total) under the tongue every 5 (five) minutes as needed for chest pain.  Marland Kitchen nystatin (MYCOSTATIN) 100000 UNIT/ML suspension Take 5 mLs by mouth 4 (four) times daily.  . ondansetron (ZOFRAN) 4 MG tablet Take 4 mg by mouth every 8 (eight) hours as needed for nausea or vomiting.  . potassium chloride (K-DUR,KLOR-CON) 10 MEQ tablet Take 30 mEq by mouth 3 (three) times daily.  Alveda Reasons 15 MG TABS  tablet TAKE ONE TABLET BY MOUTH EVERY DAY  . [DISCONTINUED] fluticasone (FLONASE) 50 MCG/ACT nasal spray Place 1 spray into both nostrils 2 (two) times daily. For 5 days  . [DISCONTINUED] oseltamivir (TAMIFLU) 75 MG capsule Take 75 mg by mouth 2 (two) times daily.  . [DISCONTINUED] potassium chloride SA (K-DUR,KLOR-CON) 20 MEQ tablet Take 1 tablet (20 mEq total) by mouth 2 (two) times daily. (Patient taking differently: Take 20 mEq by mouth 3 (three) times daily. )   No facility-administered encounter medications on  file as of 03/08/2017.      Review of Systems   In general she is not complaining of any fever chills.  Skin does not complain of rashes or itching.  Head ears eyes nose mouth and throat does not complain of sore throat at this time or visual changes.  Respiratory denies shortness of breath still has somewhat of a cough she says is somewhat nonproductive.  Cardiac is not complaining of chest pain palpitations appears to have relatively baseline lower extremity edema.  GI is not complaining of abdominal discomfort nausea vomiting diarrhea constipation.  GU is not complaining of dysuria.  Musculoskeletal does not really complain of joint pain appears to have gained strength she does ambulate with a walker.  Neurologic does not complain of dizziness headache syncope or numbness.  And psych appears to be in good spirits would really like to go home does not complain of being anxious or depressed of note manual blood pressure was 98/80 sitting and    Vitals:   03/08/17 1150  BP: 95/66  Pulse: 64  Resp: 18  Temp: (!) 96.6 F (35.9 C)  TempSrc: Oral  SpO2: 97%   of note manual blood pressure was 98/80 sitting and 102/80 standing Weight is 208 pounds which appears to be down about 5 pounds since admission Physical Exam  In general this is a pleasant elderly female who appears to be well-nourished sitting comfortably in her wheelchair.  Her skin is warm and  dry.  Eyes sclera and conjunctive are clear she has prescription lenses visual acuity appears to be intact.  Oropharynx is clear mucous membranes appear fairly moist.  Chest is clear to auscultation there is no labored breathing.  Heart distant heart sounds regular irregular rate and rhythm in the low 60s-she appears to have fairly baseline lower extremity edema.  Abdomen is soft nontender with positive bowel sounds.  Musculoskeletal is able to move all extremities x4 is able to stand without assistance and ambulate with a walker.  Neurologic is grossly intact no lateralizing findings her speech is clear.  Psych she is alert and oriented very pleasant and appropriate   Labs reviewed: Basic Metabolic Panel: Recent Labs    02/23/17 0455 02/24/17 0506  02/26/17 0427  03/01/17 0725 03/04/17 0300 03/07/17 0717  NA 136 136   < > 138   < > 139 141 140  K 3.6 3.6   < > 3.5   < > 3.2* 3.2* 3.8  CL 102 102   < > 101   < > 102 102 104  CO2 22 24   < > 25   < > 23 24 23   GLUCOSE 101* 95   < > 92   < > 94 77 94  BUN 23* 24*   < > 24*   < > 22* 31* 25*  CREATININE 0.90 0.88   < > 0.80   < > 0.87 1.06* 1.11*  CALCIUM 9.1 8.9   < > 9.1   < > 9.2 8.5* 8.8*  MG 1.9 2.0  --  1.8  --   --   --   --    < > = values in this interval not displayed.   Liver Function Tests: Recent Labs    01/02/17 1051 02/01/17 1212  AST 22 18  ALT 13* 12*  ALKPHOS 56 51  BILITOT 0.8 1.2  PROT 7.0 6.5  ALBUMIN 3.6 3.5   Recent Labs    02/01/17 1212  LIPASE  30   No results for input(s): AMMONIA in the last 8760 hours. CBC: Recent Labs    02/20/17 1253 03/01/17 0725 03/02/17 0440  WBC 7.4 6.2 6.7  NEUTROABS 4.9 4.2 4.8  HGB 11.5* 12.0 12.1  HCT 36.6 38.9 38.2  MCV 82.6 82.1 82.0  PLT 331 313 306   Cardiac Enzymes: Recent Labs    02/20/17 1253 02/20/17 1704 02/21/17 0004  TROPONINI <0.03 <0.03 <0.03   BNP: Invalid input(s): POCBNP CBG: Recent Labs    01/02/17 1519  GLUCAP 94     Procedures and Imaging Studies During Stay: Dg Chest Port 1 View  Result Date: 02/25/2017 CLINICAL DATA:  84 year old female admitted with shortness of breath, recurrent atrial fibrillation with RVR, acute on chronic systolic CHF. EXAM: PORTABLE CHEST 1 VIEW COMPARISON:  02/20/2017 and earlier. FINDINGS: Portable AP upright view at 1229 hrs. Stable cardiomegaly and mediastinal contours. Decreased bilateral pulmonary vascularity, now appearing near baseline. No pneumothorax, pleural effusion or confluent pulmonary opacity. Visualized tracheal air column is within normal limits. Advanced degenerative changes of the shoulders. IMPRESSION: 1. Resolved pulmonary interstitial edema. 2. No new cardiopulmonary abnormality. Electronically Signed   By: Genevie Ann M.D.   On: 02/25/2017 12:37   Dg Chest Port 1 View  Result Date: 02/20/2017 CLINICAL DATA:  Shortness of Breath, history of heart cath, hypertension EXAM: PORTABLE CHEST 1 VIEW COMPARISON:  02/01/2017 FINDINGS: Cardiomegaly. Mild peribronchial thickening and interstitial prominence. No confluent opacities or effusions. No acute bony abnormality. IMPRESSION: Cardiomegaly. Peribronchial thickening and interstitial prominence could reflect bronchitis or interstitial edema. Electronically Signed   By: Rolm Baptise M.D.   On: 02/20/2017 13:28    Assessment/Plan:    #1-history of influenza A-she has finished a course of Tamiflu she has been afebrile she appears to be asymptomatic she still has somewhat of a cough she says is nonproductive will add Mucinex 600 mg twice daily for 5 days with follow-up by PCP--lung exam today was quite benign.  X-ray done last week was negative for any acute process.  2.  History of acute on chronic systolic CHF her weight appears to be stable to somewhat reduced- she does not appear to be overtly symptomatic she is on Lasix 60 mg twice daily with potassium supplementation.  Will need follow-up by primary care provider as  well.  3.  History of mild hypokalemia this is supplemented and has normalized she will need an updated lab next week to be drawn by home health and PCP notified of results.  4.  History of atrial fibrillation she is on amiodarone and Lopressor this appears to be rate controlled she will need follow-up with cardiology she is on Xarelto for anticoagulation.  I do note her blood pressure systolically tends to run a bit low but she has been asymptomatic --this was discussed with Dr. Lyndel Safe and will decrease her Toprol slightly down to 37.5 mg twice daily-note her pulse on exam today was in the low 60s-it appears she would probably tolerate the dose reduction but this will have to be monitored she will need expedient cardiology follow-up   5.  History of ulcerative pancolitis this appears to be controlled on the Colazal.  6.  History of coronary artery disease this is been fairly asymptomatic during her stay here she continues on a statin beta-blocker and aspirin.  7.  History of hyperlipidemia again she continues on Lipitor.  Again she will be going home with a very supportive family-she will be with her husband she  will need continued PT OT as well as home health follow-up and updated labs as noted above keep an eye on her renal function and potassium-she also will need cardiology follow-up.  CPT 99316-of note greater than 30 minutes spent on this discharge summary- greater than 50% of time spent coordinating a plan of care for numerous diagnoses

## 2017-03-11 ENCOUNTER — Ambulatory Visit: Payer: Medicare Other | Admitting: Cardiology

## 2017-03-11 NOTE — Progress Notes (Signed)
Cardiology Office Note  Date: 03/12/2017   ID: [name], [name] Oct 12, 1930, MRN [id number]  PCP: Rory Percy, MD  Primary Cardiologist: Rozann Lesches, MD   Chief Complaint  Patient presents with  . Atrial Fibrillation  . Cardiomyopathy    History of Present Illness: [name] is an 84 y.o. female seen recently in February during hospital stay with acute on chronic systolic heart failure complicated by recurrent rapid atrial fibrillation, also deconditioning.  She underwent an initially successful cardioversion on February 19, however returned to sinus rhythm within about 24 hours.  Medical therapy was continued and efforts to provide adequate heart rate control and she was anticoagulated as before on Xarelto.  She was discharged for further rehabilitation in the nursing center.  [name]  She moved back home over the weekend and will have further physical therapy at home.  Still complains of being weak, but she does look better compared to when I saw her in the hospital and seems to have made some improvements based on discussion today.  She is using a walker.  Denies any falls.  She has an apple watch given to her by her grandson and has been tracking her heart rates, average of 90 is what she reports.  She remains in atrial fibrillation.  We have discussed continuing with a strategy of heart rate control and anticoagulation at this point.  I have asked her to start weaning her amiodarone and if we need to increase Toprol-XL for heart rate control this can be done as well.  We also discussed eventually obtaining a follow-up echocardiogram to reassess LVEF.  Past Medical History:  Diagnosis Date  . Arthritis   . Atrial flutter ([place])   . Coronary atherosclerosis of native coronary artery    BMS RCA May 2014 - [place] stent  . Depression   . Essential hypertension, benign   . Hyperlipemia   . Macular degeneration   . Paroxysmal atrial  fibrillation (HCC)   . Secondary cardiomyopathy (HCC)    LVEF 35% - improved to 45-50%  . Ulcerative colitis     Past Surgical History:  Procedure Laterality Date  . ABDOMINAL HYSTERECTOMY    . APPENDECTOMY    . Bilateral knee replacements      2007, 2008  . BLADDER SURGERY    . CARDIOVERSION N/A 02/04/2017   Procedure: CARDIOVERSION;  Surgeon: Arnoldo Lenis, MD;  [facility];  Service: Endoscopy;  Laterality: N/A;  . CARDIOVERSION N/A 02/26/2017   Procedure: CARDIOVERSION;  Surgeon: Satira Sark, MD;  Location: [facility];  Service: Cardiovascular;  Laterality: N/A;  . COLONOSCOPY N/A 06/15/2015   Procedure: COLONOSCOPY;  Surgeon: Rogene Houston, MD;  [facility];  Service: Endoscopy;  Laterality: N/A;  210  . CYSTOSCOPY W/ RETROGRADES Bilateral 05/14/2016   Procedure: CYSTOSCOPY WITH BILATERAL RETROGRADE PYELOGRAM;  Surgeon: Raynelle Bring, MD;  [facility];  Service: Urology;  Laterality: Bilateral;  GENERAL ANESTHESIA WITH PARALYSIS  . LEFT HEART CATHETERIZATION WITH CORONARY ANGIOGRAM N/A 10/30/2013   Procedure: LEFT HEART CATHETERIZATION WITH CORONARY ANGIOGRAM;  Surgeon: Burnell Blanks, MD;  Location: [facility] CATH LAB;  Service: Cardiovascular;  Laterality: N/A;  . TEE WITHOUT CARDIOVERSION N/A 02/04/2017   Procedure: TRANSESOPHAGEAL ECHOCARDIOGRAM (TEE) WITH PROPOL;  Surgeon: Arnoldo Lenis, MD;  [facility];  Service: Endoscopy;  Laterality: N/A;  . TONSILLECTOMY    . TOTAL KNEE ARTHROPLASTY    .  TRANSURETHRAL RESECTION OF BLADDER TUMOR N/A 05/14/2016   Procedure: TRANSURETHRAL RESECTION OF BLADDER TUMOR (TURBT);  Surgeon: Raynelle Bring, MD;  [facility];  Service: Urology;  Laterality: N/A;  GENERAL ANESTHESIA WITH PARALYSIS  . YAG LASER APPLICATION Bilateral 94/07/960   Procedure: YAG LASER APPLICATION;  Surgeon: Williams Che, MD;  Location[facility];  Service: Ophthalmology;  Laterality: Bilateral;    Current  Outpatient Medications  Medication Sig Dispense Refill  . acetaminophen (TYLENOL ARTHRITIS PAIN) 650 MG CR tablet Take 1,300 mg by mouth every 8 (eight) hours.     . ASPIRIN ADULT LOW STRENGTH 81 MG EC tablet Take 81 mg by mouth daily.  12  . atorvastatin (LIPITOR) 20 MG tablet TAKE ONE TABLET BY MOUTH AT BEDTIME 30 tablet 3  . balsalazide (COLAZAL) 750 MG capsule TAKE THREE CAPSULES BY MOUTH THREE TIMES DAILY (REPLACING DELZICOL) 270 capsule 3  . furosemide (LASIX) 20 MG tablet Take 3 tablets (60 mg total) by mouth 2 (two) times daily. 30 tablet   . ketotifen (THERATEARS ALLERGY) 0.025 % ophthalmic solution Place 2 drops into both eyes 3 (three) times daily as needed (for dry eyes).     . metoprolol succinate (TOPROL-XL) 50 MG 24 hr tablet Take 1 tablet (50 mg total) by mouth 2 (two) times daily with a meal. Take with or immediately following a meal.    . Multiple Vitamins-Minerals (ICAPS AREDS 2 PO) Take 2 tablets by mouth daily at 6 PM.     . nitroGLYCERIN (NITROSTAT) 0.4 MG SL tablet Place 1 tablet (0.4 mg total) under the tongue every 5 (five) minutes as needed for chest pain. 25 tablet 3  . nystatin (MYCOSTATIN) 100000 UNIT/ML suspension Take 5 mLs by mouth 4 (four) times daily.    . ondansetron (ZOFRAN) 4 MG tablet Take 4 mg by mouth every 8 (eight) hours as needed for nausea or vomiting.    . potassium chloride (K-DUR,KLOR-CON) 10 MEQ tablet Take 30 mEq by mouth 3 (three) times daily.    Alveda Reasons 15 MG TABS tablet TAKE ONE TABLET BY MOUTH EVERY DAY 30 tablet 3  . amiodarone (PACERONE) 200 MG tablet Take 1 tablet (200 mg total) by mouth daily. 30 tablet 3   No current facility-administered medications for this visit.    Allergies:  Sinus & allergy [chlorpheniramine-phenylephrine]; Demerol; and Penicillins   Social History: The patient  reports that  has never smoked. she has never used smokeless tobacco. She reports that she does not drink alcohol or use drugs.   ROS:  Please see the  history of present illness. Otherwise, complete review of systems is positive for weakness.  All other systems are reviewed and negative.   Physical Exam: VS:  BP 118/82   Pulse 84   Ht 5' 3"  (1.6 m)   Wt 209 lb (94.8 kg)   SpO2 98%   BMI 37.02 kg/m , BMI Body mass index is 37.02 kg/m.  Wt Readings from Last 3 Encounters:  03/12/17 209 lb (94.8 kg)  02/28/17 219 lb 5.7 oz (99.5 kg)  02/04/17 226 lb (102.5 kg)    General: Elderly woman, appears comfortable at rest. HEENT: Conjunctiva and lids normal, oropharynx clear. Neck: Supple, no elevated JVP or carotid bruits, no thyromegaly. Lungs: Clear to auscultation, nonlabored breathing at rest. Cardiac: Irregularly irregular, no S3, soft systolic murmur, no pericardial rub. Abdomen: Soft, nontender, bowel sounds present. Extremities: Mild ankle edema, distal pulses 2+. Skin: Warm and dry. Musculoskeletal: No kyphosis. Neuropsychiatric:  Alert and oriented x3, affect grossly appropriate.  ECG: I personally reviewed the tracing from 02/27/2017 which showed sinus bradycardia with IVCD consistent with left bundle branch block type, increased voltage.  Recent Labwork: 02/01/2017: ALT 12; AST 18; TSH 1.783 02/24/2017: B Natriuretic Peptide 308.0 02/26/2017: Magnesium 1.8 03/02/2017: Hemoglobin 12.1; Platelets 306 03/07/2017: BUN 25; Creatinine, Ser 1.11; Potassium 3.8; Sodium 140   Other Studies Reviewed Today:  TEE 02/04/2017: Study Conclusions  - Left ventricle: The cavity size was normal. Systolic function was   normal. The estimated ejection fraction was in the range of 35%   to 40%. No evidence of thrombus. - Mitral valve: There was mild regurgitation. - Left atrium: The atrium was moderately dilated. No evidence of   thrombus in the atrial cavity or appendage. Normal LAA emptying   velocity of 68 cm/s. - Right atrium: No evidence of thrombus in the atrial cavity or   appendage.  Impressions:  - Successful cardioversion. No  cardiac source of emboli was   indentified.  Assessment and Plan:  1.  Persistent atrial fibrillation.  Plan is to continue strategy of heart rate control and anticoagulation at this time.  She quickly returned to atrial fibrillation after recent cardioversion on amiodarone.  Plan to start weaning amiodarone, if we need to increase Toprol-XL for heart rate control this can be done as well.  Continue Xarelto for stroke prophylaxis.  Follow-up CMET and TSH for next visit.  2.  Cardiomyopathy, LVEF 35-40% by TEE in January.  Potentially to some degree tachycardia-mediated.  We will plan to reassess this after more time with controlled heart rate.  3.  Chronic systolic heart failure.  Her weight is down significantly and she is tolerating current dose of Lasix and potassium supplement.  May need to adjust over time, she reports that her weight is starting to stabilize.  4.  CAD status post BMS to the RCA in 2014.  She reports no angina symptoms at this time.  She is on low-dose aspirin and statin.  Current medicines were reviewed with the patient today.   Orders Placed This Encounter  Procedures  . Comprehensive Metabolic Panel (CMET)  . TSH    Disposition: Follow-up in 6 weeks.  Signed, Satira Sark, MD, [facility] 03/12/2017 10:08 AM    [facility] [address], [place], [place] [address] Phone: [phone]; Fax: [phone]

## 2017-03-12 ENCOUNTER — Ambulatory Visit: Payer: Medicare Other | Admitting: Cardiology

## 2017-03-12 ENCOUNTER — Encounter: Payer: Self-pay | Admitting: Cardiology

## 2017-03-12 VITALS — BP 118/82 | HR 84 | Ht 63.0 in | Wt 209.0 lb

## 2017-03-12 DIAGNOSIS — I2510 Atherosclerotic heart disease of native coronary artery without angina pectoris: Secondary | ICD-10-CM | POA: Diagnosis not present

## 2017-03-12 DIAGNOSIS — I429 Cardiomyopathy, unspecified: Secondary | ICD-10-CM | POA: Diagnosis not present

## 2017-03-12 DIAGNOSIS — I5022 Chronic systolic (congestive) heart failure: Secondary | ICD-10-CM | POA: Diagnosis not present

## 2017-03-12 DIAGNOSIS — I481 Persistent atrial fibrillation: Secondary | ICD-10-CM | POA: Diagnosis not present

## 2017-03-12 DIAGNOSIS — I4819 Other persistent atrial fibrillation: Secondary | ICD-10-CM

## 2017-03-12 MED ORDER — AMIODARONE HCL 200 MG PO TABS: 200.0000 mg | ORAL_TABLET | Freq: Every day | ORAL | 3 refills | Status: DC

## 2017-03-12 NOTE — Patient Instructions (Signed)
Medication Instructions:  Your physician has recommended you make the following change in your medication:    CHANGE Amiodarone 200 MG to only once daily   Please continue all other medications as prescribed  Labwork:  CMET, TSH  Orders given today  To be done prior to follow up  Testing/Procedures: NONE  Follow-Up: Your physician recommends that you schedule a follow-up appointment in: [place]  Any Other Special Instructions Will Be Listed Below (If Applicable).  If you need a refill on your cardiac medications before your next appointment, please call your pharmacy.

## 2017-03-13 DIAGNOSIS — R531 Weakness: Secondary | ICD-10-CM | POA: Diagnosis not present

## 2017-03-19 DIAGNOSIS — I482 Chronic atrial fibrillation: Secondary | ICD-10-CM | POA: Diagnosis not present

## 2017-03-19 DIAGNOSIS — Z6836 Body mass index (BMI) 36.0-36.9, adult: Secondary | ICD-10-CM | POA: Diagnosis not present

## 2017-04-04 ENCOUNTER — Other Ambulatory Visit: Payer: Self-pay | Admitting: Cardiology

## 2017-04-17 ENCOUNTER — Ambulatory Visit: Payer: Medicare Other | Admitting: Urology

## 2017-04-17 DIAGNOSIS — I10 Essential (primary) hypertension: Secondary | ICD-10-CM | POA: Diagnosis not present

## 2017-04-17 DIAGNOSIS — C674 Malignant neoplasm of posterior wall of bladder: Secondary | ICD-10-CM | POA: Diagnosis not present

## 2017-04-17 DIAGNOSIS — R5383 Other fatigue: Secondary | ICD-10-CM | POA: Diagnosis not present

## 2017-04-17 DIAGNOSIS — R739 Hyperglycemia, unspecified: Secondary | ICD-10-CM | POA: Diagnosis not present

## 2017-04-17 DIAGNOSIS — R42 Dizziness and giddiness: Secondary | ICD-10-CM | POA: Diagnosis not present

## 2017-04-23 NOTE — Progress Notes (Signed)
Cardiology Office Note  Date: 04/24/2017   ID: Special, Ranes 1930/02/11, MRN [id number]  PCP: [name] Percy, MD  Primary Cardiologist: [name] Lesches, MD   Chief Complaint  Patient presents with  . Atrial Fibrillation    History of Present Illness: [name] is an 84 y.o. female last seen in March.  She is here today with her daughter.  Reports no chest pain but remains short of breath with activities/ADLs, currently NYHA class II-III.  She does not specifically report palpitations.  She also states that her appetite has not been good.  Certain foods seem to make her nauseated.  She has had no emesis or abdominal pain.  She recently saw Dr. Nadara [name] for a follow-up visit.  States that she continues on medications regularly which from a cardiac perspective include Lasix, Toprol-XL, potassium supplements, Xarelto, and amiodarone.  She states that her stools have been "dark" but she has not otherwise noticed any specific bleeding.  Hemoglobin was normal at 12.1 in February.  She did have recent lab work at [facility] on April 10 with BUN 27, creatinine 1.13, potassium 5.0, AST 28, ALT 13, and TSH elevated at 9.03.  [name] still seems to be fairly frustrated with her functional capacity.  Most likely this is a multifactorial issue however in the setting of known ischemic heart disease, cardiomyopathy, persistent atrial fibrillation, and advanced age.  She would like further EP opinion regarding other treatment options for atrial fibrillation including potential for ablation, pacing with AV node modification, or another choice for antiarrhythmic therapy.  I have asked her to be realistic about these options as her candidacy may be limited.  We did discuss stopping amiodarone completely since this has been ineffective in maintaining sinus rhythm.  We would concurrently increase her Toprol-XL dose for adequate heart rate control otherwise continue Xarelto.  We also discussed  obtaining a follow-up CBC and an echocardiogram to reevaluate LVEF.  Past Medical History:  Diagnosis Date  . Arthritis   . Atrial flutter ([place])   . Coronary atherosclerosis of native coronary artery    BMS RCA May 2014 - [place] stent  . Depression   . Essential hypertension, benign   . Hyperlipemia   . Macular degeneration   . Paroxysmal atrial fibrillation (HCC)   . Secondary cardiomyopathy (HCC)    LVEF 35% - improved to 45-50%  . Ulcerative colitis     Past Surgical History:  Procedure Laterality Date  . ABDOMINAL HYSTERECTOMY    . APPENDECTOMY    . Bilateral knee replacements      2007, 2008  . BLADDER SURGERY    . CARDIOVERSION N/A 02/04/2017   Procedure: CARDIOVERSION;  Surgeon: Arnoldo Lenis, MD;  [facility];  Service: Endoscopy;  Laterality: N/A;  . CARDIOVERSION N/A 02/26/2017   Procedure: CARDIOVERSION;  Surgeon: Satira Sark, MD;  Location: [facility];  Service: Cardiovascular;  Laterality: N/A;  . COLONOSCOPY N/A 06/15/2015   Procedure: COLONOSCOPY;  Surgeon: Rogene Houston, MD;  [facility];  Service: Endoscopy;  Laterality: N/A;  210  . CYSTOSCOPY W/ RETROGRADES Bilateral 05/14/2016   Procedure: CYSTOSCOPY WITH BILATERAL RETROGRADE PYELOGRAM;  Surgeon: Raynelle Bring, MD;  [facility];  Service: Urology;  Laterality: Bilateral;  GENERAL ANESTHESIA WITH PARALYSIS  . LEFT HEART CATHETERIZATION WITH CORONARY ANGIOGRAM N/A 10/30/2013   Procedure: LEFT HEART CATHETERIZATION WITH CORONARY ANGIOGRAM;  Surgeon: Burnell Blanks, MD;  Location[facility] CATH LAB;  Service: Cardiovascular;  Laterality: N/A;  . TEE WITHOUT CARDIOVERSION N/A 02/04/2017   Procedure: TRANSESOPHAGEAL ECHOCARDIOGRAM (TEE) WITH PROPOL;  Surgeon: Arnoldo Lenis, MD;  [facility];  Service: Endoscopy;  Laterality: N/A;  . TONSILLECTOMY    . TOTAL KNEE ARTHROPLASTY    . TRANSURETHRAL RESECTION OF BLADDER TUMOR N/A 05/14/2016   Procedure: TRANSURETHRAL  RESECTION OF BLADDER TUMOR (TURBT);  Surgeon: Raynelle Bring, MD;  [facility];  Service: Urology;  Laterality: N/A;  GENERAL ANESTHESIA WITH PARALYSIS  . YAG LASER APPLICATION Bilateral 61/09/5091   Procedure: YAG LASER APPLICATION;  Surgeon: Williams Che, MD;  Location: [facility];  Service: Ophthalmology;  Laterality: Bilateral;    Current Outpatient Medications  Medication Sig Dispense Refill  . ASPIRIN ADULT LOW STRENGTH 81 MG EC tablet Take 81 mg by mouth daily.  12  . atorvastatin (LIPITOR) 20 MG tablet TAKE ONE TABLET BY MOUTH AT BEDTIME 30 tablet 3  . balsalazide (COLAZAL) 750 MG capsule TAKE THREE CAPSULES BY MOUTH THREE TIMES DAILY (REPLACING DELZICOL) 270 capsule 3  . furosemide (LASIX) 20 MG tablet TAKE THREE TABLETS BY MOUTH TWICE DAILY 192 tablet 3  . ketotifen (THERATEARS ALLERGY) 0.025 % ophthalmic solution Place 2 drops into both eyes 3 (three) times daily as needed (for dry eyes).     . metoprolol succinate (TOPROL-XL) 50 MG 24 hr tablet Take 1 tablet (50 mg total) by mouth 2 (two) times daily with a meal. Take with or immediately following a meal. 180 tablet 1  . Multiple Vitamins-Minerals (ICAPS AREDS 2 PO) Take 2 tablets by mouth daily at 6 PM.     . nitroGLYCERIN (NITROSTAT) 0.4 MG SL tablet Place 1 tablet (0.4 mg total) under the tongue every 5 (five) minutes as needed for chest pain. 25 tablet 3  . potassium chloride (K-DUR,KLOR-CON) 10 MEQ tablet TAKE THREE TABLETS BY MOUTH THREE TIMES DAILY 270 tablet 3  . XARELTO 15 MG TABS tablet TAKE ONE TABLET BY MOUTH EVERY DAY 30 tablet 3   No current facility-administered medications for this visit.    Allergies:  Sinus & allergy [chlorpheniramine-phenylephrine]; Demerol; and Penicillins   Social History: The patient  reports that she has never smoked. She has never used smokeless tobacco. She reports that she does not drink alcohol or use drugs.   Family History: The patient's family history includes CAD in her father.     ROS:  Please see the history of present illness. Otherwise, complete review of systems is positive for shortness of breath and fatigue, nausea.  All other systems are reviewed and negative.   Physical Exam: VS:  BP 112/74   Pulse 86   Ht 5' 3"  (1.6 m)   Wt 203 lb (92.1 kg)   SpO2 98%   BMI 35.96 kg/m , BMI Body mass index is 35.96 kg/m.  Wt Readings from Last 3 Encounters:  04/24/17 203 lb (92.1 kg)  03/12/17 209 lb (94.8 kg)  02/28/17 219 lb 5.7 oz (99.5 kg)    General: Elderly woman, no distress.  Using a walker. HEENT: Conjunctiva and lids normal, oropharynx clear with moist mucosa. Neck: Supple, no elevated JVP or carotid bruits, no thyromegaly. Lungs: Clear to auscultation, nonlabored breathing at rest. Cardiac: Distant irregular heart sounds, no S3, 2/6 systolic murmur, no pericardial rub. Abdomen: Soft, nontender, bowel sounds present. Extremities: Mild ankle edema, distal pulses 2+. Skin: Warm and dry. Musculoskeletal: No kyphosis. Neuropsychiatric: Alert and oriented x3, affect grossly appropriate.  ECG: I personally reviewed the  tracing from 02/27/2017 which showed sinus bradycardia with IVCD consistent with left bundle branch block type, increased voltage.  Recent Labwork: 02/01/2017: ALT 12; AST 18; TSH 1.783 02/24/2017: B Natriuretic Peptide 308.0 02/26/2017: Magnesium 1.8 03/02/2017: Hemoglobin 12.1; Platelets 306 03/07/2017: BUN 25; Creatinine, Ser 1.11; Potassium 3.8; Sodium 140   Other Studies Reviewed Today:  TEE 02/04/2017: Study Conclusions  - Left ventricle: The cavity size was normal. Systolic function was   normal. The estimated ejection fraction was in the range of 35%   to 40%. No evidence of thrombus. - Mitral valve: There was mild regurgitation. - Left atrium: The atrium was moderately dilated. No evidence of   thrombus in the atrial cavity or appendage. Normal LAA emptying   velocity of 68 cm/s. - Right atrium: No evidence of thrombus in the  atrial cavity or   appendage.  Impressions:  - Successful cardioversion. No cardiac source of emboli was   indentified.  Assessment and Plan:  1.  Persistent atrial fibrillation.  As discussed above plan is to stop amiodarone completely as this has been ineffective in maintaining sinus rhythm.  Increase Toprol-XL to 50 mg twice daily and continue Xarelto for now.  We will obtain a follow-up CBC.  She and her daughter have discussed the matter and would like to pursue EP consultation regarding other treatment options for atrial fibrillation.  I have asked her to be realistic about her candidacy for ablation, particularly in the setting of cardiomyopathy and moderate left atrial enlargement.  She is being referred to see Dr. Rayann Heman.  2.  Cardiomyopathy, potentially nonischemic in etiology.  LVEF was 35-40% by TEE in January.  Follow-up echocardiogram will be obtained now that heart rate control has been better.  3.  Chronic systolic heart failure.  Weight is down, no substantial fluid overload at this time on current dose of diuretics.  4.  Ischemic heart disease with history of BMS to the RCA in 2014.  She does not report any active angina.  If LVEF has reduced further, we will need to consider further ischemic testing as well.  5.  Elevated TSH of 9.03.  Recommend follow-up with Dr. Nadara [name].  Of note, amiodarone is being discontinued as noted above.  Current medicines were reviewed with the patient today.   Orders Placed This Encounter  Procedures  . CBC  . Ambulatory referral to Cardiac Electrophysiology  . ECHOCARDIOGRAM COMPLETE    Disposition: Follow-up in 6 weeks.  Signed, Satira Sark, MD, [facility] 04/24/2017 10:38 AM    [facility] [place], [place], [place] [address] Phone: [phone]; Fax: [phone]

## 2017-04-24 ENCOUNTER — Encounter: Payer: Self-pay | Admitting: *Deleted

## 2017-04-24 ENCOUNTER — Telehealth: Payer: Self-pay | Admitting: Cardiology

## 2017-04-24 ENCOUNTER — Encounter: Payer: Self-pay | Admitting: Cardiology

## 2017-04-24 ENCOUNTER — Ambulatory Visit: Payer: Medicare Other | Admitting: Cardiology

## 2017-04-24 VITALS — BP 112/74 | HR 86 | Ht 63.0 in | Wt 203.0 lb

## 2017-04-24 DIAGNOSIS — I2510 Atherosclerotic heart disease of native coronary artery without angina pectoris: Secondary | ICD-10-CM

## 2017-04-24 DIAGNOSIS — I429 Cardiomyopathy, unspecified: Secondary | ICD-10-CM

## 2017-04-24 DIAGNOSIS — I4819 Other persistent atrial fibrillation: Secondary | ICD-10-CM

## 2017-04-24 DIAGNOSIS — R7989 Other specified abnormal findings of blood chemistry: Secondary | ICD-10-CM

## 2017-04-24 DIAGNOSIS — I5022 Chronic systolic (congestive) heart failure: Secondary | ICD-10-CM | POA: Diagnosis not present

## 2017-04-24 DIAGNOSIS — I481 Persistent atrial fibrillation: Secondary | ICD-10-CM

## 2017-04-24 MED ORDER — METOPROLOL SUCCINATE ER 50 MG PO TB24: 50.0000 mg | ORAL_TABLET | Freq: Two times a day (BID) | ORAL | 1 refills | Status: DC

## 2017-04-24 NOTE — Patient Instructions (Addendum)
Your physician recommends that you schedule a follow-up appointment in: [place] has recommended you make the following change in your medication:   TOPROL XL 50 MG TWICE DAILY  STOP AMIODARONE   Your physician recommends that you return for lab work [place] has requested that you have an echocardiogram. Echocardiography is a painless test that uses sound waves to create images of your heart. It provides your doctor with information about the size and shape of your heart and how well your heart's chambers and valves are working. This procedure takes approximately one hour. There are no restrictions for this procedure.  You have been referred to [facility]   Thank you for choosing [facility]!!

## 2017-04-24 NOTE — Telephone Encounter (Signed)
Pre-cert Verification for the following procedure   Echo scheduled for 04-25-17

## 2017-04-25 ENCOUNTER — Other Ambulatory Visit: Payer: Self-pay

## 2017-04-25 ENCOUNTER — Ambulatory Visit (INDEPENDENT_AMBULATORY_CARE_PROVIDER_SITE_OTHER): Payer: Medicare Other

## 2017-04-25 DIAGNOSIS — I429 Cardiomyopathy, unspecified: Secondary | ICD-10-CM

## 2017-04-26 ENCOUNTER — Telehealth: Payer: Self-pay | Admitting: *Deleted

## 2017-04-26 NOTE — Telephone Encounter (Signed)
-----   Message from Satira Sark, MD sent at 04/26/2017 10:06 AM EDT ----- Results reviewed.  LVEF is approximately 25% at this point despite reasonable control of her heart rate in atrial fibrillation.  Please schedule an office visit with me next week so that we can go over these findings and discuss other test options.  I am concerned that we may need to consider a cardiac catheterization and further optimization of her medications prior to EP consultation. A copy of this test should be forwarded to Rory Percy, MD.

## 2017-04-26 NOTE — Telephone Encounter (Signed)
-----   Message from [name] Sark, MD sent at 04/25/2017  2:06 PM EDT ----- Results reviewed.  Hemoglobin is just mildly reduced at 11.2 (low normal 11.5).  Not entirely clear that this should be causing her any obvious symptoms of fatigue at this time. A copy of this test should be forwarded to Rory Percy, MD.

## 2017-04-26 NOTE — Telephone Encounter (Signed)
Patient informed and verbalized understanding of plan. Copy sent to PCP 

## 2017-04-26 NOTE — Telephone Encounter (Signed)
-----   Message from Satira Sark, MD sent at 04/25/2017  2:51 PM EDT ----- Results reviewed.  I reviewed these yesterday.  Renal function and potassium are relatively stable.  TSH is elevated, she should follow-up with Dr. Nadara Mustard on thyroid status, otherwise as we already discussed we are stopping amiodarone in case there is any association.  Liver tests are normal. A copy of this test should be forwarded to Rory Percy, MD.

## 2017-04-29 NOTE — H&P (View-Only) (Signed)
Cardiology Office Note  Date: 04/30/2017   ID: [name], [name] 11-17-1930, MRN [id number]  PCP: Rory Percy, MD  Primary Cardiologist: Rozann Lesches, MD   Chief Complaint  Patient presents with  . Cardiomyopathy  . Atrial Fibrillation    History of Present Illness: [name] is an 84 y.o. female seen recently on April 17.  At the last visit we increased her Toprol-XL with concurrent discontinuation of amiodarone, and otherwise continued Xarelto for management of persistent atrial fibrillation (failed cardioversion and amiodarone).  In addition a follow-up echocardiogram was obtained and unfortunately revealed further reduction in LVEF to approximately 25% despite adequate heart rate control.  She presents today to discuss cardiac catheterization.  She is here with her daughter.  Continues to report weakness and NYHA class III dyspnea, although not specifically angina.  She reports compliance with her medications and her heart rate is well controlled today.  We discussed proceeding to a right and left heart catheterization for further assessment.  She also has pending EP consultation.  Recent lab work is outlined below.  Reviewed the results with her.  Dr. Nadara Mustard plans additional thyroid studies.  Past Medical History:  Diagnosis Date  . Arthritis   . Atrial flutter ([place])   . Coronary atherosclerosis of native coronary artery    BMS RCA May 2014 - [place] stent  . Depression   . Essential hypertension, benign   . Hyperlipemia   . Macular degeneration   . Paroxysmal atrial fibrillation (HCC)   . Secondary cardiomyopathy (HCC)    LVEF 35% - improved to 45-50%  . Ulcerative colitis     Past Surgical History:  Procedure Laterality Date  . ABDOMINAL HYSTERECTOMY    . APPENDECTOMY    . Bilateral knee replacements      2007, 2008  . BLADDER SURGERY    . CARDIOVERSION N/A 02/04/2017   Procedure: CARDIOVERSION;  Surgeon: Arnoldo Lenis, MD;  [facility];  Service: Endoscopy;  Laterality: N/A;  . CARDIOVERSION N/A 02/26/2017   Procedure: CARDIOVERSION;  Surgeon: Satira Sark, MD;  Location: [facility];  Service: Cardiovascular;  Laterality: N/A;  . COLONOSCOPY N/A 06/15/2015   Procedure: COLONOSCOPY;  Surgeon: Rogene Houston, MD;  [facility];  Service: Endoscopy;  Laterality: N/A;  210  . CYSTOSCOPY W/ RETROGRADES Bilateral 05/14/2016   Procedure: CYSTOSCOPY WITH BILATERAL RETROGRADE PYELOGRAM;  Surgeon: Raynelle Bring, MD;  [facility];  Service: Urology;  Laterality: Bilateral;  GENERAL ANESTHESIA WITH PARALYSIS  . LEFT HEART CATHETERIZATION WITH CORONARY ANGIOGRAM N/A 10/30/2013   Procedure: LEFT HEART CATHETERIZATION WITH CORONARY ANGIOGRAM;  Surgeon: Burnell Blanks, MD;  Location[facility] LAB;  Service: Cardiovascular;  Laterality: N/A;  . TEE WITHOUT CARDIOVERSION N/A 02/04/2017   Procedure: TRANSESOPHAGEAL ECHOCARDIOGRAM (TEE) WITH PROPOL;  Surgeon: Arnoldo Lenis, MD;  [facility];  Service: Endoscopy;  Laterality: N/A;  . TONSILLECTOMY    . TOTAL KNEE ARTHROPLASTY    . TRANSURETHRAL RESECTION OF BLADDER TUMOR N/A 05/14/2016   Procedure: TRANSURETHRAL RESECTION OF BLADDER TUMOR (TURBT);  Surgeon: Raynelle Bring, MD;  [facility];  Service: Urology;  Laterality: N/A;  GENERAL ANESTHESIA WITH PARALYSIS  . YAG LASER APPLICATION Bilateral 80/01/6551   Procedure: YAG LASER APPLICATION;  Surgeon: Williams Che, MD;  Location: [facility];  Service: Ophthalmology;  Laterality: Bilateral;    Current Outpatient Medications  Medication Sig Dispense Refill  . ASPIRIN ADULT LOW STRENGTH 81 MG  EC tablet Take 81 mg by mouth daily.  12  . atorvastatin (LIPITOR) 20 MG tablet TAKE ONE TABLET BY MOUTH AT BEDTIME 30 tablet 3  . balsalazide (COLAZAL) 750 MG capsule TAKE THREE CAPSULES BY MOUTH THREE TIMES DAILY (REPLACING DELZICOL) 270 capsule 3  . furosemide (LASIX) 20 MG tablet TAKE THREE TABLETS BY  MOUTH TWICE DAILY 192 tablet 3  . ketotifen (THERATEARS ALLERGY) 0.025 % ophthalmic solution Place 2 drops into both eyes 3 (three) times daily as needed (for dry eyes).     . metoprolol succinate (TOPROL-XL) 50 MG 24 hr tablet Take 1 tablet (50 mg total) by mouth 2 (two) times daily with a meal. Take with or immediately following a meal. 180 tablet 1  . Multiple Vitamins-Minerals (ICAPS AREDS 2 PO) Take 2 tablets by mouth daily at 6 PM.     . nitroGLYCERIN (NITROSTAT) 0.4 MG SL tablet Place 1 tablet (0.4 mg total) under the tongue every 5 (five) minutes as needed for chest pain. 25 tablet 3  . potassium chloride (K-DUR,KLOR-CON) 10 MEQ tablet TAKE THREE TABLETS BY MOUTH THREE TIMES DAILY 270 tablet 3  . XARELTO 15 MG TABS tablet TAKE ONE TABLET BY MOUTH EVERY DAY 30 tablet 3   No current facility-administered medications for this visit.    Allergies:  Sinus & allergy [chlorpheniramine-phenylephrine]; Demerol; and Penicillins   Social History: The patient  reports that she has never smoked. She has never used smokeless tobacco. She reports that she does not drink alcohol or use drugs.   Family History: The patient's family history includes CAD in her father.   ROS:  Please see the history of present illness. Otherwise, complete review of systems is positive for weakness and fatigue.  All other systems are reviewed and negative.   Physical Exam: VS:  BP 120/66   Pulse 80   Ht 5' 3"  (1.6 m)   Wt 202 lb (91.6 kg)   BMI 35.78 kg/m , BMI Body mass index is 35.78 kg/m.  Wt Readings from Last 3 Encounters:  04/30/17 202 lb (91.6 kg)  04/24/17 203 lb (92.1 kg)  03/12/17 209 lb (94.8 kg)    General: Elderly woman, no distress.  Using a walker. HEENT: Conjunctiva and lids normal, oropharynx clear with moist mucosa. Neck: Supple, no elevated JVP or carotid bruits, no thyromegaly. Lungs: Clear to auscultation, nonlabored breathing at rest. Cardiac: Irregularly irregular, no S3, 2/6 systolic  murmur, no pericardial rub. Abdomen: Soft, nontender, bowel sounds present. Extremities: Mild ankle edema, distal pulses 2+. Skin: Warm and dry. Musculoskeletal: No kyphosis. Neuropsychiatric: Alert and oriented x3, affect grossly appropriate.  ECG: I personally reviewed the tracing from 02/27/2017 which showed sinus bradycardia with left bundle branch block.  Recent Labwork: 02/01/2017: ALT 12; AST 18; TSH 1.783 02/24/2017: B Natriuretic Peptide 308.0 02/26/2017: Magnesium 1.8 03/02/2017: Hemoglobin 12.1; Platelets 306 03/07/2017: BUN 25; Creatinine, Ser 1.11; Potassium 3.8; Sodium 140  April 2019: Hemoglobin 11.2, platelets 315, BUN 27, creatinine 1.13, potassium 5.0, AST 28, ALT 13, TSH 9.03  Other Studies Reviewed Today:  Echocardiogram 04/25/2017: Study Conclusions  - Left ventricle: The cavity size was mildly to moderately dilated.   Wall thickness was normal. The estimated ejection fraction was   approximately 25%. Diffuse hypokinesis. There is akinesis of the   anteroseptal myocardium. There is akinesis of the   basal-midinferior myocardium. Indeterminate diastolic function. - Ventricular septum: Septal motion showed abnormal function and   dyssynergy. - Aortic valve: Mildly to moderately calcified annulus.  Trileaflet. - Mitral valve: Mildly calcified annulus. Mildly thickened leaflets   . There was moderate regurgitation. - Left atrium: The atrium was severely dilated. - Right ventricle: Systolic function was mildly reduced. - Right atrium: The atrium was at the upper limits of normal in   size. Central venous pressure (est): 3 mm Hg. - Atrial septum: No defect or patent foramen ovale was identified. - Tricuspid valve: There was mild regurgitation. - Pulmonary arteries: PA peak pressure: 30 mm Hg (S). - Pericardium, extracardiac: There was no pericardial effusion.  Assessment and Plan:  1.  Progressive cardiomyopathy with LVEF now approximately 25% despite adequate heart  rate control of atrial fibrillation and medical therapy for ischemic heart disease.  We discussed risks and benefits of a right and left heart catheterization for further hemodynamic and coronary anatomy assessment, and she is in agreement to proceed.  This will be scheduled for Friday.  Xarelto to be held greater than 24 hours prior to procedure.  We will get lab work on the morning of presentation.  Anticipate further medication adjustments (Aldactone, ARB) depending on hemodynamics and blood pressure trend.  2.  CAD with history of BMS to the RCA in 2014.  3.  Persistent atrial fibrillation, failed cardioversion on amiodarone and now being managed with a strategy of heart rate control and anticoagulation.  She does have a EP consultation pending at her request regarding other treatment options (antiarrhythmic therapy versus ablation).  4.  Elevated TSH of 9.03.  She was already taken off amiodarone as outlined above.  Additional lab work pending per Dr. Nadara Mustard with follow-up.  5.  Mild anemia, hemoglobin 11.2.  Current medicines were reviewed with the patient today.  Disposition: Follow-up after procedure.  Signed, Satira Sark, MD, [facility] 04/30/2017 12:03 PM    [facility] [place], [place], [place] [address] Phone: [phone]; Fax: [phone]

## 2017-04-29 NOTE — Progress Notes (Signed)
Cardiology Office Note  Date: 04/30/2017   ID: [name], [name] September 03, 1930, MRN [id number]  PCP: Rory Percy, MD  Primary Cardiologist: Rozann Lesches, MD   Chief Complaint  Patient presents with  . Cardiomyopathy  . Atrial Fibrillation    History of Present Illness: [name] is an 84 y.o. female seen recently on April 17.  At the last visit we increased her Toprol-XL with concurrent discontinuation of amiodarone, and otherwise continued Xarelto for management of persistent atrial fibrillation (failed cardioversion and amiodarone).  In addition a follow-up echocardiogram was obtained and unfortunately revealed further reduction in LVEF to approximately 25% despite adequate heart rate control.  She presents today to discuss cardiac catheterization.  She is here with her daughter.  Continues to report weakness and NYHA class III dyspnea, although not specifically angina.  She reports compliance with her medications and her heart rate is well controlled today.  We discussed proceeding to a right and left heart catheterization for further assessment.  She also has pending EP consultation.  Recent lab work is outlined below.  Reviewed the results with her.  Dr. Nadara Mustard plans additional thyroid studies.  Past Medical History:  Diagnosis Date  . Arthritis   . Atrial flutter ([place])   . Coronary atherosclerosis of native coronary artery    BMS RCA May 2014 - [place] stent  . Depression   . Essential hypertension, benign   . Hyperlipemia   . Macular degeneration   . Paroxysmal atrial fibrillation (HCC)   . Secondary cardiomyopathy (HCC)    LVEF 35% - improved to 45-50%  . Ulcerative colitis     Past Surgical History:  Procedure Laterality Date  . ABDOMINAL HYSTERECTOMY    . APPENDECTOMY    . Bilateral knee replacements      2007, 2008  . BLADDER SURGERY    . CARDIOVERSION N/A 02/04/2017   Procedure: CARDIOVERSION;  Surgeon: Arnoldo Lenis, MD;  [facility];  Service: Endoscopy;  Laterality: N/A;  . CARDIOVERSION N/A 02/26/2017   Procedure: CARDIOVERSION;  Surgeon: Satira Sark, MD;  [facility];  Service: Cardiovascular;  Laterality: N/A;  . COLONOSCOPY N/A 06/15/2015   Procedure: COLONOSCOPY;  Surgeon: Rogene Houston, MD;  [facility];  Service: Endoscopy;  Laterality: N/A;  210  . CYSTOSCOPY W/ RETROGRADES Bilateral 05/14/2016   Procedure: CYSTOSCOPY WITH BILATERAL RETROGRADE PYELOGRAM;  Surgeon: Raynelle Bring, MD;  [facility];  Service: Urology;  Laterality: Bilateral;  GENERAL ANESTHESIA WITH PARALYSIS  . LEFT HEART CATHETERIZATION WITH CORONARY ANGIOGRAM N/A 10/30/2013   Procedure: LEFT HEART CATHETERIZATION WITH CORONARY ANGIOGRAM;  Surgeon: Burnell Blanks, MD;  Location: [facility] CATH LAB;  Service: Cardiovascular;  Laterality: N/A;  . TEE WITHOUT CARDIOVERSION N/A 02/04/2017   Procedure: TRANSESOPHAGEAL ECHOCARDIOGRAM (TEE) WITH PROPOL;  Surgeon: Arnoldo Lenis, MD;  [facility];  Service: Endoscopy;  Laterality: N/A;  . TONSILLECTOMY    . TOTAL KNEE ARTHROPLASTY    . TRANSURETHRAL RESECTION OF BLADDER TUMOR N/A 05/14/2016   Procedure: TRANSURETHRAL RESECTION OF BLADDER TUMOR (TURBT);  Surgeon: Raynelle Bring, MD;  [facility];  Service: Urology;  Laterality: N/A;  GENERAL ANESTHESIA WITH PARALYSIS  . YAG LASER APPLICATION Bilateral 02/12/8525   Procedure: YAG LASER APPLICATION;  Surgeon: Williams Che, MD;  Location: [facility];  Service: Ophthalmology;  Laterality: Bilateral;    Current Outpatient Medications  Medication Sig Dispense Refill  . ASPIRIN ADULT LOW STRENGTH 81 MG  EC tablet Take 81 mg by mouth daily.  12  . atorvastatin (LIPITOR) 20 MG tablet TAKE ONE TABLET BY MOUTH AT BEDTIME 30 tablet 3  . balsalazide (COLAZAL) 750 MG capsule TAKE THREE CAPSULES BY MOUTH THREE TIMES DAILY (REPLACING DELZICOL) 270 capsule 3  . furosemide (LASIX) 20 MG tablet TAKE THREE TABLETS BY  MOUTH TWICE DAILY 192 tablet 3  . ketotifen (THERATEARS ALLERGY) 0.025 % ophthalmic solution Place 2 drops into both eyes 3 (three) times daily as needed (for dry eyes).     . metoprolol succinate (TOPROL-XL) 50 MG 24 hr tablet Take 1 tablet (50 mg total) by mouth 2 (two) times daily with a meal. Take with or immediately following a meal. 180 tablet 1  . Multiple Vitamins-Minerals (ICAPS AREDS 2 PO) Take 2 tablets by mouth daily at 6 PM.     . nitroGLYCERIN (NITROSTAT) 0.4 MG SL tablet Place 1 tablet (0.4 mg total) under the tongue every 5 (five) minutes as needed for chest pain. 25 tablet 3  . potassium chloride (K-DUR,KLOR-CON) 10 MEQ tablet TAKE THREE TABLETS BY MOUTH THREE TIMES DAILY 270 tablet 3  . XARELTO 15 MG TABS tablet TAKE ONE TABLET BY MOUTH EVERY DAY 30 tablet 3   No current facility-administered medications for this visit.    Allergies:  Sinus & allergy [chlorpheniramine-phenylephrine]; Demerol; and Penicillins   Social History: The patient  reports that she has never smoked. She has never used smokeless tobacco. She reports that she does not drink alcohol or use drugs.   Family History: The patient's family history includes CAD in her father.   ROS:  Please see the history of present illness. Otherwise, complete review of systems is positive for weakness and fatigue.  All other systems are reviewed and negative.   Physical Exam: VS:  BP 120/66   Pulse 80   Ht 5' 3"  (1.6 m)   Wt 202 lb (91.6 kg)   BMI 35.78 kg/m , BMI Body mass index is 35.78 kg/m.  Wt Readings from Last 3 Encounters:  04/30/17 202 lb (91.6 kg)  04/24/17 203 lb (92.1 kg)  03/12/17 209 lb (94.8 kg)    General: Elderly woman, no distress.  Using a walker. HEENT: Conjunctiva and lids normal, oropharynx clear with moist mucosa. Neck: Supple, no elevated JVP or carotid bruits, no thyromegaly. Lungs: Clear to auscultation, nonlabored breathing at rest. Cardiac: Irregularly irregular, no S3, 2/6 systolic  murmur, no pericardial rub. Abdomen: Soft, nontender, bowel sounds present. Extremities: Mild ankle edema, distal pulses 2+. Skin: Warm and dry. Musculoskeletal: No kyphosis. Neuropsychiatric: Alert and oriented x3, affect grossly appropriate.  ECG: I personally reviewed the tracing from 02/27/2017 which showed sinus bradycardia with left bundle branch block.  Recent Labwork: 02/01/2017: ALT 12; AST 18; TSH 1.783 02/24/2017: B Natriuretic Peptide 308.0 02/26/2017: Magnesium 1.8 03/02/2017: Hemoglobin 12.1; Platelets 306 03/07/2017: BUN 25; Creatinine, Ser 1.11; Potassium 3.8; Sodium 140  April 2019: Hemoglobin 11.2, platelets 315, BUN 27, creatinine 1.13, potassium 5.0, AST 28, ALT 13, TSH 9.03  Other Studies Reviewed Today:  Echocardiogram 04/25/2017: Study Conclusions  - Left ventricle: The cavity size was mildly to moderately dilated.   Wall thickness was normal. The estimated ejection fraction was   approximately 25%. Diffuse hypokinesis. There is akinesis of the   anteroseptal myocardium. There is akinesis of the   basal-midinferior myocardium. Indeterminate diastolic function. - Ventricular septum: Septal motion showed abnormal function and   dyssynergy. - Aortic valve: Mildly to moderately calcified annulus.  Trileaflet. - Mitral valve: Mildly calcified annulus. Mildly thickened leaflets   . There was moderate regurgitation. - Left atrium: The atrium was severely dilated. - Right ventricle: Systolic function was mildly reduced. - Right atrium: The atrium was at the upper limits of normal in   size. Central venous pressure (est): 3 mm Hg. - Atrial septum: No defect or patent foramen ovale was identified. - Tricuspid valve: There was mild regurgitation. - Pulmonary arteries: PA peak pressure: 30 mm Hg (S). - Pericardium, extracardiac: There was no pericardial effusion.  Assessment and Plan:  1.  Progressive cardiomyopathy with LVEF now approximately 25% despite adequate heart  rate control of atrial fibrillation and medical therapy for ischemic heart disease.  We discussed risks and benefits of a right and left heart catheterization for further hemodynamic and coronary anatomy assessment, and she is in agreement to proceed.  This will be scheduled for Friday.  Xarelto to be held greater than 24 hours prior to procedure.  We will get lab work on the morning of presentation.  Anticipate further medication adjustments (Aldactone, ARB) depending on hemodynamics and blood pressure trend.  2.  CAD with history of BMS to the RCA in 2014.  3.  Persistent atrial fibrillation, failed cardioversion on amiodarone and now being managed with a strategy of heart rate control and anticoagulation.  She does have a EP consultation pending at her request regarding other treatment options (antiarrhythmic therapy versus ablation).  4.  Elevated TSH of 9.03.  She was already taken off amiodarone as outlined above.  Additional lab work pending per Dr. Nadara Mustard with follow-up.  5.  Mild anemia, hemoglobin 11.2.  Current medicines were reviewed with the patient today.  Disposition: Follow-up after procedure.  Signed, Satira Sark, MD, [facility] 04/30/2017 12:03 PM    [facility] [place], [place], [place] [address] Phone: [phone]; Fax: [phone]

## 2017-04-30 ENCOUNTER — Ambulatory Visit: Payer: Medicare Other | Admitting: Cardiology

## 2017-04-30 ENCOUNTER — Other Ambulatory Visit: Payer: Self-pay | Admitting: Cardiology

## 2017-04-30 ENCOUNTER — Telehealth: Payer: Self-pay | Admitting: Cardiology

## 2017-04-30 ENCOUNTER — Encounter: Payer: Self-pay | Admitting: Cardiology

## 2017-04-30 VITALS — BP 120/66 | HR 80 | Ht 63.0 in | Wt 202.0 lb

## 2017-04-30 DIAGNOSIS — R946 Abnormal results of thyroid function studies: Secondary | ICD-10-CM | POA: Diagnosis not present

## 2017-04-30 DIAGNOSIS — R0609 Other forms of dyspnea: Secondary | ICD-10-CM

## 2017-04-30 DIAGNOSIS — R7989 Other specified abnormal findings of blood chemistry: Secondary | ICD-10-CM

## 2017-04-30 DIAGNOSIS — R0600 Dyspnea, unspecified: Secondary | ICD-10-CM

## 2017-04-30 DIAGNOSIS — I25119 Atherosclerotic heart disease of native coronary artery with unspecified angina pectoris: Secondary | ICD-10-CM | POA: Diagnosis not present

## 2017-04-30 DIAGNOSIS — I429 Cardiomyopathy, unspecified: Secondary | ICD-10-CM | POA: Diagnosis not present

## 2017-04-30 DIAGNOSIS — I4819 Other persistent atrial fibrillation: Secondary | ICD-10-CM

## 2017-04-30 DIAGNOSIS — I481 Persistent atrial fibrillation: Secondary | ICD-10-CM | POA: Diagnosis not present

## 2017-04-30 DIAGNOSIS — D649 Anemia, unspecified: Secondary | ICD-10-CM

## 2017-04-30 NOTE — Telephone Encounter (Signed)
Precert for [facility] Heart Cath scheduled May 03, 2017 with Ellyn Hack

## 2017-04-30 NOTE — Patient Instructions (Signed)
Medication Instructions:   Your physician recommends that you continue on your current medications as directed. Please refer to the Current Medication list given to you today.  Labwork: On Friday   Testing/Procedures: Your physician has requested that you have a cardiac catheterization. Cardiac catheterization is used to diagnose and/or treat various heart conditions. Doctors may recommend this procedure for a number of different reasons. The most common reason is to evaluate chest pain. Chest pain can be a symptom of coronary artery disease (CAD), and cardiac catheterization can show whether plaque is narrowing or blocking your heart's arteries. This procedure is also used to evaluate the valves, as well as measure the blood flow and oxygen levels in different parts of your heart. For further information please visit [email or web address]. Please follow instruction sheet, as given.    Follow-Up: Your physician recommends that you schedule a follow-up appointment in: pending    Any Other Special Instructions Will Be Listed Below (If Applicable).   [place] [address] Dept: [phone] Loc: [place]  04/30/2017  You are scheduled for a Cardiac Catheterization on Friday, April 26 with Dr. Glenetta Hew.  1. Please arrive at the [facility] (Main Entrance A) at [facility]: [place], [place] [address] at 10:00 AM (two hours before your procedure to ensure your preparation). Free valet parking service is available.   Special note: Every effort is made to have your procedure done on time. Please understand that emergencies sometimes delay scheduled procedures.  2. Diet: Do not eat or drink anything after midnight prior to your procedure except sips of water to take medications.  3. Labs: Your labs will be performed at  the hospital after you arrive for your procedure.  4. Medication instructions in preparation for your procedure:   Please DO NOT TAKE YOU XARELTO ON Wednesday OR Thursday         On the morning of your procedure, take your Aspirin and any morning medicines NOT listed above.  You may use sips of water.  5. Plan for one night stay--bring personal belongings. 6. Bring a current list of your medications and current insurance cards. 7. You MUST have a responsible person to drive you home. 8. Someone MUST be with you the first 24 hours after you arrive home or your discharge will be delayed. 9. Please wear clothes that are easy to get on and off and wear slip-on shoes.  Thank you for allowing [place] to care for you!   -- [facility]      If you need a refill on your cardiac medications before your next appointment, please call your pharmacy.

## 2017-05-02 ENCOUNTER — Telehealth: Payer: Self-pay | Admitting: *Deleted

## 2017-05-02 NOTE — Telephone Encounter (Signed)
Pt contacted pre-catheterization scheduled at [facility] for: Friday May 03, 2017 12 noon Verified arrival time and place: [place] at: 10 AM Nothing to eat or drink after midnight prior to cath. Verified allergies in Epic. Verified no diabetes medications.  Hold: Xarelto 05/01/17 until post cath. Furosemide AM of cath. KCL AM of cath.   Except hold medications  AM meds can be  taken pre-cath with sip of water including: ASA 81 mg  Confirmed patient has responsible person to drive home post procedure and observe patient for 24 hours: yes

## 2017-05-03 ENCOUNTER — Ambulatory Visit (HOSPITAL_COMMUNITY)
Admission: RE | Admit: 2017-05-03 | Discharge: 2017-05-03 | Disposition: A | Discharge status: Home / Self Care | Payer: Medicare Other | Source: Ambulatory Visit | Attending: Cardiology | Admitting: Cardiology

## 2017-05-03 ENCOUNTER — Ambulatory Visit (HOSPITAL_COMMUNITY)
Admission: RE | Disposition: A | Discharge status: Home / Self Care | Payer: Self-pay | Source: Ambulatory Visit | Attending: Cardiology

## 2017-05-03 DIAGNOSIS — H3530 Unspecified macular degeneration: Secondary | ICD-10-CM | POA: Insufficient documentation

## 2017-05-03 DIAGNOSIS — Z880 Allergy status to penicillin: Secondary | ICD-10-CM | POA: Insufficient documentation

## 2017-05-03 DIAGNOSIS — R946 Abnormal results of thyroid function studies: Secondary | ICD-10-CM | POA: Diagnosis not present

## 2017-05-03 DIAGNOSIS — Z955 Presence of coronary angioplasty implant and graft: Secondary | ICD-10-CM | POA: Insufficient documentation

## 2017-05-03 DIAGNOSIS — I4892 Unspecified atrial flutter: Secondary | ICD-10-CM | POA: Diagnosis present

## 2017-05-03 DIAGNOSIS — I2510 Atherosclerotic heart disease of native coronary artery without angina pectoris: Secondary | ICD-10-CM | POA: Diagnosis not present

## 2017-05-03 DIAGNOSIS — I5042 Chronic combined systolic (congestive) and diastolic (congestive) heart failure: Secondary | ICD-10-CM | POA: Diagnosis not present

## 2017-05-03 DIAGNOSIS — D649 Anemia, unspecified: Secondary | ICD-10-CM | POA: Diagnosis not present

## 2017-05-03 DIAGNOSIS — E785 Hyperlipidemia, unspecified: Secondary | ICD-10-CM | POA: Diagnosis not present

## 2017-05-03 DIAGNOSIS — M1990 Unspecified osteoarthritis, unspecified site: Secondary | ICD-10-CM | POA: Diagnosis not present

## 2017-05-03 DIAGNOSIS — Z7901 Long term (current) use of anticoagulants: Secondary | ICD-10-CM | POA: Insufficient documentation

## 2017-05-03 DIAGNOSIS — I484 Atypical atrial flutter: Secondary | ICD-10-CM | POA: Insufficient documentation

## 2017-05-03 DIAGNOSIS — I110 Hypertensive heart disease with heart failure: Secondary | ICD-10-CM | POA: Diagnosis not present

## 2017-05-03 DIAGNOSIS — I481 Persistent atrial fibrillation: Secondary | ICD-10-CM | POA: Insufficient documentation

## 2017-05-03 DIAGNOSIS — F329 Major depressive disorder, single episode, unspecified: Secondary | ICD-10-CM | POA: Insufficient documentation

## 2017-05-03 DIAGNOSIS — I428 Other cardiomyopathies: Secondary | ICD-10-CM | POA: Diagnosis not present

## 2017-05-03 DIAGNOSIS — I10 Essential (primary) hypertension: Secondary | ICD-10-CM | POA: Diagnosis present

## 2017-05-03 DIAGNOSIS — I429 Cardiomyopathy, unspecified: Secondary | ICD-10-CM

## 2017-05-03 HISTORY — PX: RIGHT/LEFT HEART CATH AND CORONARY ANGIOGRAPHY: CATH118266

## 2017-05-03 LAB — POCT I-STAT 3, VENOUS BLOOD GAS (G3P V)
ACID-BASE DEFICIT: 2 mmol/L (ref 0.0–2.0)
Acid-base deficit: 3 mmol/L — ABNORMAL HIGH (ref 0.0–2.0)
BICARBONATE: 22.9 mmol/L (ref 20.0–28.0)
Bicarbonate: 21.6 mmol/L (ref 20.0–28.0)
O2 Saturation: 55 %
O2 Saturation: 55 %
PCO2 VEN: 36.9 mmHg — AB (ref 44.0–60.0)
PCO2 VEN: 39.2 mmHg — AB (ref 44.0–60.0)
PH VEN: 7.375 (ref 7.250–7.430)
PO2 VEN: 30 mmHg — AB (ref 32.0–45.0)
TCO2: 23 mmol/L (ref 22–32)
TCO2: 24 mmol/L (ref 22–32)
pH, Ven: 7.375 (ref 7.250–7.430)
pO2, Ven: 30 mmHg — CL (ref 32.0–45.0)

## 2017-05-03 LAB — POCT I-STAT 3, ART BLOOD GAS (G3+)
Acid-base deficit: 3 mmol/L — ABNORMAL HIGH (ref 0.0–2.0)
BICARBONATE: 20.2 mmol/L (ref 20.0–28.0)
O2 Saturation: 99 %
PH ART: 7.461 — AB (ref 7.350–7.450)
PO2 ART: 137 mmHg — AB (ref 83.0–108.0)
TCO2: 21 mmol/L — AB (ref 22–32)
pCO2 arterial: 28.3 mmHg — ABNORMAL LOW (ref 32.0–48.0)

## 2017-05-03 LAB — BASIC METABOLIC PANEL
Anion gap: 12 (ref 5–15)
BUN: 17 mg/dL (ref 6–20)
CO2: 21 mmol/L — ABNORMAL LOW (ref 22–32)
CREATININE: 1.06 mg/dL — AB (ref 0.44–1.00)
Calcium: 9.3 mg/dL (ref 8.9–10.3)
Chloride: 104 mmol/L (ref 101–111)
GFR, EST AFRICAN AMERICAN: 54 mL/min — AB (ref >60)
GFR, EST NON AFRICAN AMERICAN: 47 mL/min — AB (ref >60)
Glucose, Bld: 91 mg/dL (ref 65–99)
Potassium: 3.4 mmol/L — ABNORMAL LOW (ref 3.5–5.1)
SODIUM: 137 mmol/L (ref 135–145)

## 2017-05-03 LAB — CBC
HCT: 31.3 % — ABNORMAL LOW (ref 36.0–46.0)
Hemoglobin: 9.9 g/dL — ABNORMAL LOW (ref 12.0–15.0)
MCH: 24.2 pg — ABNORMAL LOW (ref 26.0–34.0)
MCHC: 31.6 g/dL (ref 30.0–36.0)
MCV: 76.5 fL — ABNORMAL LOW (ref 78.0–100.0)
PLATELETS: 323 10*3/uL (ref 150–400)
RBC: 4.09 MIL/uL (ref 3.87–5.11)
RDW: 18 % — AB (ref 11.5–15.5)
WBC: 5.2 10*3/uL (ref 4.0–10.5)

## 2017-05-03 LAB — PROTIME-INR
INR: 1.2
PROTHROMBIN TIME: 15.1 s (ref 11.4–15.2)

## 2017-05-03 LAB — POCT ACTIVATED CLOTTING TIME: ACTIVATED CLOTTING TIME: 175 s

## 2017-05-03 SURGERY — RIGHT/LEFT HEART CATH AND CORONARY ANGIOGRAPHY
Anesthesia: LOCAL

## 2017-05-03 MED ORDER — SODIUM CHLORIDE 0.9% FLUSH: 3.0000 mL | Freq: Two times a day (BID) | INTRAVENOUS | Status: DC

## 2017-05-03 MED ORDER — HEPARIN SODIUM (PORCINE) 1000 UNIT/ML IJ SOLN
INTRAMUSCULAR | Status: AC
  Filled 2017-05-03: qty 1

## 2017-05-03 MED ORDER — POTASSIUM CHLORIDE CRYS ER 20 MEQ PO TBCR
EXTENDED_RELEASE_TABLET | ORAL | Status: AC
  Filled 2017-05-03: qty 1

## 2017-05-03 MED ORDER — ASPIRIN 81 MG PO CHEW: 81.0000 mg | CHEWABLE_TABLET | ORAL | Status: DC

## 2017-05-03 MED ORDER — FENTANYL CITRATE (PF) 100 MCG/2ML IJ SOLN
INTRAMUSCULAR | Status: AC
  Filled 2017-05-03: qty 2

## 2017-05-03 MED ORDER — SODIUM CHLORIDE 0.9 % WEIGHT BASED INFUSION: 1.0000 mL/kg/h | INTRAVENOUS | Status: DC

## 2017-05-03 MED ORDER — MIDAZOLAM HCL 2 MG/2ML IJ SOLN
INTRAMUSCULAR | Status: AC
  Filled 2017-05-03: qty 2

## 2017-05-03 MED ORDER — SODIUM CHLORIDE 0.9% FLUSH: 3.0000 mL | INTRAVENOUS | Status: DC | PRN

## 2017-05-03 MED ORDER — ACETAMINOPHEN 325 MG PO TABS: 650.0000 mg | ORAL_TABLET | ORAL | Status: DC | PRN

## 2017-05-03 MED ORDER — FENTANYL CITRATE (PF) 100 MCG/2ML IJ SOLN
INTRAMUSCULAR | Status: DC | PRN
  Administered 2017-05-03: 25 ug via INTRAVENOUS

## 2017-05-03 MED ORDER — SODIUM CHLORIDE 0.9 % IV SOLN: 250.0000 mL | INTRAVENOUS | Status: DC | PRN

## 2017-05-03 MED ORDER — LIDOCAINE HCL (PF) 1 % IJ SOLN
INTRAMUSCULAR | Status: AC
  Filled 2017-05-03: qty 30

## 2017-05-03 MED ORDER — VERAPAMIL HCL 2.5 MG/ML IV SOLN
INTRAVENOUS | Status: DC | PRN
  Administered 2017-05-03: 10 mL via INTRA_ARTERIAL

## 2017-05-03 MED ORDER — SODIUM CHLORIDE 0.9 % IV SOLN
INTRAVENOUS | Status: AC
  Administered 2017-05-03: 15:00:00 via INTRAVENOUS

## 2017-05-03 MED ORDER — SODIUM CHLORIDE 0.9 % WEIGHT BASED INFUSION
3.0000 mL/kg/h | INTRAVENOUS | Status: AC
  Administered 2017-05-03: 3 mL/kg/h via INTRAVENOUS

## 2017-05-03 MED ORDER — ONDANSETRON HCL 4 MG/2ML IJ SOLN: 4.0000 mg | Freq: Four times a day (QID) | INTRAMUSCULAR | Status: DC | PRN

## 2017-05-03 MED ORDER — IOHEXOL 350 MG/ML SOLN
INTRAVENOUS | Status: DC | PRN
  Administered 2017-05-03: 75 mL via INTRAVENOUS

## 2017-05-03 MED ORDER — POTASSIUM CHLORIDE CRYS ER 20 MEQ PO TBCR
20.0000 meq | EXTENDED_RELEASE_TABLET | Freq: Once | ORAL | Status: AC
  Administered 2017-05-03: 20 meq via ORAL
  Filled 2017-05-03: qty 1

## 2017-05-03 MED ORDER — HEPARIN SODIUM (PORCINE) 1000 UNIT/ML IJ SOLN
INTRAMUSCULAR | Status: DC | PRN
  Administered 2017-05-03: 5000 [IU] via INTRAVENOUS

## 2017-05-03 MED ORDER — LIDOCAINE HCL (PF) 1 % IJ SOLN
INTRAMUSCULAR | Status: DC | PRN
  Administered 2017-05-03 (×2): 2 mL
  Administered 2017-05-03: 15 mL

## 2017-05-03 MED ORDER — HEPARIN (PORCINE) IN NACL 2-0.9 UNITS/ML
INTRAMUSCULAR | Status: AC | PRN
  Administered 2017-05-03 (×2): 500 mL

## 2017-05-03 MED ORDER — VERAPAMIL HCL 2.5 MG/ML IV SOLN
INTRAVENOUS | Status: AC
  Filled 2017-05-03: qty 2

## 2017-05-03 MED ORDER — MIDAZOLAM HCL 2 MG/2ML IJ SOLN
INTRAMUSCULAR | Status: DC | PRN
  Administered 2017-05-03: 1 mg via INTRAVENOUS

## 2017-05-03 MED ORDER — HEPARIN (PORCINE) IN NACL 1000-0.9 UT/500ML-% IV SOLN
INTRAVENOUS | Status: AC
  Filled 2017-05-03: qty 1000

## 2017-05-03 SURGICAL SUPPLY — 15 items
BAND CMPR LRG ZPHR (HEMOSTASIS) ×1
BAND ZEPHYR COMPRESS 30 LONG (HEMOSTASIS) ×1 IMPLANT
CATH OPTITORQUE TIG 4.0 5F (CATHETERS) ×1 IMPLANT
CATH SWAN GANZ 7F STRAIGHT (CATHETERS) ×1 IMPLANT
COVER PRB 48X5XTLSCP FOLD TPE (BAG) IMPLANT
COVER PROBE 5X48 (BAG) ×2
GLIDESHEATH SLEND A-KIT 6F 22G (SHEATH) ×1 IMPLANT
GUIDEWIRE INQWIRE 1.5J.035X260 (WIRE) IMPLANT
INQWIRE 1.5J .035X260CM (WIRE) ×2
KIT HEART LEFT (KITS) ×2 IMPLANT
PACK CARDIAC CATHETERIZATION (CUSTOM PROCEDURE TRAY) ×2 IMPLANT
SHEATH GLIDE SLENDER 4/5FR (SHEATH) ×1 IMPLANT
SHEATH PINNACLE 7F 10CM (SHEATH) ×1 IMPLANT
TRANSDUCER W/STOPCOCK (MISCELLANEOUS) ×2 IMPLANT
TUBING CIL FLEX 10 FLL-RA (TUBING) ×2 IMPLANT

## 2017-05-03 NOTE — Progress Notes (Signed)
Cath lab notified of inability to get 2 IV access sites by 4 nurses including IVT.  Pt has one running IV

## 2017-05-03 NOTE — Discharge Instructions (Signed)
Radial Site Care Refer to this sheet in the next few weeks. These instructions provide you with information about caring for yourself after your procedure. Your health care provider may also give you more specific instructions. Your treatment has been planned according to current medical practices, but problems sometimes occur. Call your health care provider if you have any problems or questions after your procedure. What can I expect after the procedure? After your procedure, it is typical to have the following:  Bruising at the radial site that usually fades within 1-2 weeks.  Blood collecting in the tissue (hematoma) that may be painful to the touch. It should usually decrease in size and tenderness within 1-2 weeks.  Follow these instructions at home:  Take medicines only as directed by your health care provider.  You may shower 24-48 hours after the procedure or as directed by your health care provider. Remove the bandage (dressing) and gently wash the site with plain soap and water. Pat the area dry with a clean towel. Do not rub the site, because this may cause bleeding.  Do not take baths, swim, or use a hot tub until your health care provider approves.  Check your insertion site every day for redness, swelling, or drainage.  Do not apply powder or lotion to the site.  Do not flex or bend the affected arm for 24 hours or as directed by your health care provider.  Do not push or pull heavy objects with the affected arm for 24 hours or as directed by your health care provider.  Do not lift over 10 lb (4.5 kg) for 5 days after your procedure or as directed by your health care provider.  Ask your health care provider when it is okay to: ? Return to work or school. ? Resume usual physical activities or sports. ? Resume sexual activity.  Do not drive home if you are discharged the same day as the procedure. Have someone else drive you.  You may drive 24 hours after the procedure  unless otherwise instructed by your health care provider.  Do not operate machinery or power tools for 24 hours after the procedure.  If your procedure was done as an outpatient procedure, which means that you went home the same day as your procedure, a responsible adult should be with you for the first 24 hours after you arrive home.  Keep all follow-up visits as directed by your health care provider. This is important. Contact a health care provider if:  You have a fever.  You have chills.  You have increased bleeding from the radial site. Hold pressure on the site. Get help right away if:  You have unusual pain at the radial site.  You have redness, warmth, or swelling at the radial site.  You have drainage (other than a small amount of blood on the dressing) from the radial site.  The radial site is bleeding, and the bleeding does not stop after 30 minutes of holding steady pressure on the site.  Your arm or hand becomes pale, cool, tingly, or numb. This information is not intended to replace advice given to you by your health care provider. Make sure you discuss any questions you have with your health care provider. Document Released: 01/27/2010 Document Revised: 06/02/2015 Document Reviewed: 07/13/2013 Elsevier Interactive Patient Education  2018 [employer] This sheet gives you information about how to care for yourself after your procedure. Your health care provider may also give you  more specific instructions. If you have problems or questions, contact your health care provider. What can I expect after the procedure? After the procedure, it is common to have:  Bruising or mild discomfort in the area where the IV was inserted (insertion site).  Follow these instructions at home: Eating and drinking  Follow instructions from your health care provider about eating or drinking restrictions.  Drink a lot of fluids for the first several days after  the procedure, as directed by your health care provider. This helps to wash (flush) the contrast out of your body. Examples of healthy fluids include water or low-calorie drinks. General instructions  Check your IV insertion area every day for signs of infection. Check for: ? Redness, swelling, or pain. ? Fluid or blood. ? Warmth. ? Pus or a bad smell.  Take over-the-counter and prescription medicines only as told by your health care provider.  Rest and return to your normal activities as told by your health care provider. Ask your health care provider what activities are safe for you.  Do not drive for 24 hours if you were given a medicine to help you relax (sedative), or until your health care provider approves.  Keep all follow-up visits as told by your health care provider. This is important. Contact a health care provider if:  Your skin becomes itchy or you develop a rash or hives.  You have a fever that does not get better with medicine.  You feel nauseous.  You vomit.  You have redness, swelling, or pain around the insertion site.  You have fluid or blood coming from the insertion site.  Your insertion area feels warm to the touch.  You have pus or a bad smell coming from the insertion site. Get help right away if:  You have difficulty breathing or shortness of breath.  You develop chest pain.  You faint.  You feel very dizzy. These symptoms may represent a serious problem that is an emergency. Do not wait to see if the symptoms will go away. Get medical help right away. Call your local emergency services (911 in the [place] Do not drive yourself to the hospital. Summary  After your procedure, it is common to have bruising or mild discomfort in the area where the IV was inserted.  You should check your IV insertion area every day for signs of infection.  Take over-the-counter and prescription medicines only as told by your health care provider.  You should  drink a lot of fluids for the first several days after the procedure to help flush the contrast from your body. This information is not intended to replace advice given to you by your health care provider. Make sure you discuss any questions you have with your health care provider. Document Released: 10/15/2012 Document Revised: 11/19/2015 Document Reviewed: 11/19/2015 Elsevier Interactive Patient Education  2017 [employer].

## 2017-05-03 NOTE — Interval H&P Note (Signed)
History and Physical Interval Note:  05/03/2017 12:30 PM  [name]  has presented today for surgery, with the diagnosis of cad - cardiomyopathy. Active Problems:   Atrial flutter (HCC)   Coronary atherosclerosis of native coronary artery   Secondary cardiomyopathy (HCC)   Chronic combined systolic and diastolic CHF, NYHA class 3 (HCC)   Essential hypertension, benign    The various methods of treatment have been discussed with the patient and family. After consideration of risks, benefits and other options for treatment, the patient has consented to  Procedure(s): RIGHT/LEFT HEART CATH AND CORONARY ANGIOGRAPHY (N/A) with possible PERCUTANEOUS CORONARY INTERVENTION as a surgical intervention .  The patient's history has been reviewed, patient examined, no change in status, stable for surgery.  I have reviewed the patient's chart and labs.  Questions were answered to the patient's satisfaction.     Cath Lab Visit (complete for each Cath Lab visit)  Clinical Evaluation Leading to the Procedure:   ACS: Yes.    Non-ACS:    Anginal Classification: CCS III  Anti-ischemic medical therapy: Minimal Therapy (1 class of medications)  Non-Invasive Test Results: High-risk stress test findings: cardiac mortality >3%/year -newly reduced EF on echo to 25%  Prior CABG: No previous CABG   Glenetta Hew

## 2017-05-06 ENCOUNTER — Encounter (HOSPITAL_COMMUNITY): Payer: Self-pay | Admitting: Cardiology

## 2017-05-06 MED FILL — Heparin Sod (Porcine)-NaCl IV Soln 1000 Unit/500ML-0.9%: INTRAVENOUS | Qty: 1000 | Status: AC

## 2017-05-21 ENCOUNTER — Ambulatory Visit: Payer: Medicare Other | Admitting: Cardiology

## 2017-05-21 ENCOUNTER — Ambulatory Visit (INDEPENDENT_AMBULATORY_CARE_PROVIDER_SITE_OTHER): Payer: Medicare Other | Admitting: Internal Medicine

## 2017-05-21 ENCOUNTER — Encounter: Payer: Self-pay | Admitting: Cardiology

## 2017-05-21 VITALS — BP 100/66 | HR 94 | Ht 63.0 in | Wt 196.0 lb

## 2017-05-21 DIAGNOSIS — I428 Other cardiomyopathies: Secondary | ICD-10-CM

## 2017-05-21 DIAGNOSIS — I481 Persistent atrial fibrillation: Secondary | ICD-10-CM | POA: Diagnosis not present

## 2017-05-21 DIAGNOSIS — I2510 Atherosclerotic heart disease of native coronary artery without angina pectoris: Secondary | ICD-10-CM

## 2017-05-21 DIAGNOSIS — I4819 Other persistent atrial fibrillation: Secondary | ICD-10-CM

## 2017-05-21 MED ORDER — DOFETILIDE 500 MCG PO CAPS: 500.0000 ug | ORAL_CAPSULE | Freq: Two times a day (BID) | ORAL | 0 refills | Status: DC

## 2017-05-21 MED ORDER — LISINOPRIL 5 MG PO TABS: 5.0000 mg | ORAL_TABLET | Freq: Every day | ORAL | 6 refills | Status: DC

## 2017-05-21 NOTE — Addendum Note (Signed)
Addended by: Stanton Kidney on: 05/21/2017 03:00 PM   Modules accepted: Orders

## 2017-05-21 NOTE — Patient Instructions (Addendum)
Medication Instructions:  Your physician has recommended you make the following change in your medication:  1. START Lisinopril 5 mg once daily  Labwork: None ordered  Testing/Procedures: None ordered  Follow-Up: You are scheduled to see Roderic Palau, NP in the [facility] clinic on 05/28/2017 @ 8:30 am.   Your physician recommends that you schedule a follow-up appointment in: 3 months with Dr. Curt Bears.   * If you need a refill on your cardiac medications before your next appointment, please call your pharmacy.   *Please note that any paperwork needing to be filled out by the provider will need to be addressed at the front desk prior to seeing the provider. Please note that any FMLA, disability or other documents regarding health condition is subject to a $25.00 charge that must be received prior to completion of paperwork in the form of a money order or check.  Thank you for choosing [facility]!!   Trinidad Curet, RN [phone]  Any Other Special Instructions Will Be Listed Below (If Applicable).  Please call your pharmacy to check on the cost of Tikosyn.  Nurse will call you later this week to see if affordable.  Dofetilide capsules What is this medicine? DOFETILIDE (doe FET il ide) is an antiarrhythmic drug. It helps make your heart beat regularly. This medicine also helps to slow rapid heartbeats. This medicine may be used for other purposes; ask your health care provider or pharmacist if you have questions. COMMON BRAND NAME(S): Tikosyn What should I tell my health care provider before I take this medicine? They need to know if you have any of these conditions: -heart disease -history of low levels of potassium or magnesium -kidney disease -liver disease -an unusual or allergic reaction to dofetilide, other medicines, foods, dyes, or preservatives -pregnant or trying to get pregnant -breast-feeding How should I use this medicine? Take this medicine by mouth with a  glass of water. Follow the directions on the prescription label. You can take this medicine with or without food. Do not drink grapefruit juice with this medicine. Take your doses at regular intervals. Do not take your medicine more often than directed. Do not stop taking this medicine suddenly. This may cause serious, heart-related side effects. Your doctor will tell you how much medicine to take. If your doctor wants you to stop the medicine, the dose will be slowly lowered over time to avoid any side effects. Talk to your pediatrician regarding the use of this medicine in children. Special care may be needed. Overdosage: If you think you have taken too much of this medicine contact a poison control center or emergency room at once. NOTE: This medicine is only for you. Do not share this medicine with others. What if I miss a dose? If you miss a dose, take it as soon as you can. If it is almost time for your next dose, take only that dose. Do not take double or extra doses. What may interact with this medicine? Do not take this medicine with any of the following medications: -cimetidine -dolutegravir -hydrochlorothiazide alone or in combination with other medicines -isavuconazonium -ketoconazole -megestrol -other medicines that prolong the QT interval (cause an abnormal heart rhythm) -prochlorperazine -trimethoprim alone or in combination with sulfamethoxazole -verapamil This medicine may also interact with the following medications: -amiloride -certain antidepressants like fluvoxamine or paroxetine -certain antiviral medicines for HIV or AIDS like atazanavir or darunavir -certain medicines for fungal infections like clotrimazole or miconazole -digoxin -diltiazem -dronabinol, THC -grapefruit juice -metformin -  nefazodone -triamterene -zafirlukast This list may not describe all possible interactions. Give your health care provider a list of all the medicines, herbs, non-prescription  drugs, or dietary supplements you use. Also tell them if you smoke, drink alcohol, or use illegal drugs. Some items may interact with your medicine. What should I watch for while using this medicine? Visit your doctor or health care professional for regular checks on your progress. Wear a medical ID bracelet or chain, and carry a card that describes your disease and details of your medicine and dosage times. Check your heart rate and blood pressure regularly while you are taking this medicine. Ask your doctor or health care professional what your heart rate and blood pressure should be, and when you should contact him or her. Your doctor or health care professional also may schedule regular tests to check your progress. You will be started on this medicine in a specialized facility for at least three days. You will be monitored to find the right dose of medicine for you. It is very important that you take your medicine exactly as prescribed when you leave the hospital. The correct dosing of this medicine is very important to treat your condition and prevent possible serious side effects. What side effects may I notice from receiving this medicine? Side effects that you should report to your doctor or health care professional as soon as possible: -allergic reactions like skin rash, itching or hives, swelling of the face, lips, or tongue -breathing problems -dizziness -fast or rapid beating of the heart -feeling faint or lightheaded -swelling of the ankles -unusually weak or tired -vomiting Side effects that usually do not require medical attention (report to your doctor or health care professional if they continue or are bothersome): -cough -diarrhea -difficulty sleeping -headache -nausea -stomach pain This list may not describe all possible side effects. Call your doctor for medical advice about side effects. You may report side effects to FDA at 1-800-FDA-1088. Where should I keep my  medicine? Keep out of the reach of children. Store at room temperature between 15 and 30 degrees C (59 and 86 degrees F). Protect the medicine from moisture or humidity. Keep container tightly closed. Throw away any unused medicine after the expiration date. NOTE: This sheet is a summary. It may not cover all possible information. If you have questions about this medicine, talk to your doctor, pharmacist, or health care provider.  2018 Elsevier/Gold Standard (2015-11-07 49:17:91)

## 2017-05-21 NOTE — Progress Notes (Signed)
Electrophysiology Office Note   Date:  05/21/2017   ID:  [name], [name] 11-13-1930, MRN [id number]  PCP:  Rory Percy, MD  Cardiologist:  Domenic Polite Primary Electrophysiologist:  Saanvika Vazques Meredith Leeds, MD    Chief Complaint  Patient presents with  . Advice Only    Afib  . Shortness of Breath     History of Present Illness: [name] is a 84 y.o. female who is being seen today for the evaluation of atrial fibrillation at the request of Rory Percy, MD. Presenting today for electrophysiology evaluation.  She has a history of atrial flutter, coronary artery disease status post RCA stent in 2014, paroxysmal atrial fibrillation, and nonischemic cardiomyopathy.  She does have atrial fibrillation and is failed cardioversion and amiodarone.  Most recent echo showed an EF of 25% with catheterization showing a likely nonischemic cardiomyopathy.  She has weakness in class III dyspnea.    Today, she denies symptoms of palpitations, chest pain, orthopnea, PND, lower extremity edema, claudication, dizziness, presyncope, syncope, bleeding, or neurologic sequela. The patient is tolerating medications without difficulties.  Her main symptoms today are of weakness, fatigue, and shortness of breath.  She has been feeling this way for the last few months.  She feels that it is due to a combination of both her atrial fibrillation and her chronic systolic heart failure.   Past Medical History:  Diagnosis Date  . Arthritis   . Atrial flutter ([place])   . Coronary atherosclerosis of native coronary artery    BMS RCA May 2014 - [place] stent  . Depression   . Essential hypertension, benign   . Hyperlipemia   . Macular degeneration   . Paroxysmal atrial fibrillation (HCC)   . Secondary cardiomyopathy (HCC)    LVEF 35% - improved to 45-50%  . Ulcerative colitis    Past Surgical History:  Procedure Laterality Date  . ABDOMINAL HYSTERECTOMY    . APPENDECTOMY    . Bilateral knee  replacements      2007, 2008  . BLADDER SURGERY    . CARDIOVERSION N/A 02/04/2017   Procedure: CARDIOVERSION;  Surgeon: Arnoldo Lenis, MD;  [facility];  Service: Endoscopy;  Laterality: N/A;  . CARDIOVERSION N/A 02/26/2017   Procedure: CARDIOVERSION;  Surgeon: Satira Sark, MD;  Location: [facility];  Service: Cardiovascular;  Laterality: N/A;  . COLONOSCOPY N/A 06/15/2015   Procedure: COLONOSCOPY;  Surgeon: Rogene Houston, MD;  [facility];  Service: Endoscopy;  Laterality: N/A;  210  . CYSTOSCOPY W/ RETROGRADES Bilateral 05/14/2016   Procedure: CYSTOSCOPY WITH BILATERAL RETROGRADE PYELOGRAM;  Surgeon: Raynelle Bring, MD;  [facility];  Service: Urology;  Laterality: Bilateral;  GENERAL ANESTHESIA WITH PARALYSIS  . LEFT HEART CATHETERIZATION WITH CORONARY ANGIOGRAM N/A 10/30/2013   Procedure: LEFT HEART CATHETERIZATION WITH CORONARY ANGIOGRAM;  Surgeon: Burnell Blanks, MD;  Location: [facility] CATH LAB;  Service: Cardiovascular;  Laterality: N/A;  . RIGHT/LEFT HEART CATH AND CORONARY ANGIOGRAPHY N/A 05/03/2017   Procedure: RIGHT/LEFT HEART CATH AND CORONARY ANGIOGRAPHY;  Surgeon: Leonie Man, MD;  Location: [place] CV LAB;  Service: Cardiovascular;  Laterality: N/A;  . TEE WITHOUT CARDIOVERSION N/A 02/04/2017   Procedure: TRANSESOPHAGEAL ECHOCARDIOGRAM (TEE) WITH PROPOL;  Surgeon: Arnoldo Lenis, MD;  [facility];  Service: Endoscopy;  Laterality: N/A;  . TONSILLECTOMY    . TOTAL KNEE ARTHROPLASTY    . TRANSURETHRAL RESECTION OF BLADDER TUMOR N/A 05/14/2016   Procedure: TRANSURETHRAL RESECTION OF  BLADDER TUMOR (TURBT);  Surgeon: Raynelle Bring, MD;  [facility];  Service: Urology;  Laterality: N/A;  GENERAL ANESTHESIA WITH PARALYSIS  . YAG LASER APPLICATION Bilateral 40/0/8676   Procedure: YAG LASER APPLICATION;  Surgeon: Williams Che, MD;  [facility];  Service: Ophthalmology;  Laterality: Bilateral;     Current  Outpatient Medications  Medication Sig Dispense Refill  . ASPIRIN ADULT LOW STRENGTH 81 MG EC tablet Take 81 mg by mouth daily.  12  . atorvastatin (LIPITOR) 20 MG tablet TAKE ONE TABLET BY MOUTH AT BEDTIME 30 tablet 3  . balsalazide (COLAZAL) 750 MG capsule TAKE THREE CAPSULES BY MOUTH THREE TIMES DAILY (REPLACING DELZICOL) 270 capsule 3  . furosemide (LASIX) 20 MG tablet TAKE THREE TABLETS BY MOUTH TWICE DAILY 192 tablet 3  . ketotifen (THERATEARS ALLERGY) 0.025 % ophthalmic solution Place 2 drops into both eyes 3 (three) times daily as needed (for dry eyes).     . metoprolol succinate (TOPROL-XL) 50 MG 24 hr tablet Take 1 tablet (50 mg total) by mouth 2 (two) times daily with a meal. Take with or immediately following a meal. 180 tablet 1  . Multiple Vitamins-Minerals (ICAPS AREDS 2 PO) Take 2 tablets by mouth daily at 6 PM.     . potassium chloride (K-DUR,KLOR-CON) 10 MEQ tablet TAKE THREE TABLETS BY MOUTH THREE TIMES DAILY 270 tablet 3  . XARELTO 15 MG TABS tablet TAKE ONE TABLET BY MOUTH EVERY DAY (Patient taking differently: TAKE ONE TABLET BY MOUTH EVERY DAY AT 1800) 30 tablet 3  . nitroGLYCERIN (NITROSTAT) 0.4 MG SL tablet Place 1 tablet (0.4 mg total) under the tongue every 5 (five) minutes as needed for chest pain. 25 tablet 3   No current facility-administered medications for this visit.     Allergies:   Sinus & allergy [chlorpheniramine-phenylephrine]; Demerol; and Penicillins   Social History:  The patient  reports that she has never smoked. She has never used smokeless tobacco. She reports that she does not drink alcohol or use drugs.   Family History:  The patient's family history includes CAD in her father.    ROS:  Please see the history of present illness.   Otherwise, review of systems is positive for weight loss, appetite change, fatigue, chills, chest pain, leg swelling, shortness of breath, leg pain, palpitations, visual changes, abdominal pain, diarrhea, constipation,  nausea, anxiety, balance problems, headaches, easy bruising.   All other systems are reviewed and negative.    PHYSICAL EXAM: VS:  BP 100/66   Pulse 94   Ht 5' 3"  (1.6 m)   Wt 196 lb (88.9 kg)   SpO2 97%   BMI 34.72 kg/m  , BMI Body mass index is 34.72 kg/m. GEN: Well nourished, well developed, in no acute distress  HEENT: normal  Neck: no JVD, carotid bruits, or masses Cardiac: iRRR; no murmurs, rubs, or gallops,no edema  Respiratory:  clear to auscultation bilaterally, normal work of breathing GI: soft, nontender, nondistended, + BS MS: no deformity or atrophy  Skin: warm and dry Neuro:  Strength and sensation are intact Psych: euthymic mood, full affect  EKG:  EKG is not ordered today. Personal review of the ekg ordered 02/27/17 shows sinus bradycardia, RBBB, LVH  Recent Labs: 02/01/2017: ALT 12; TSH 1.783 02/24/2017: B Natriuretic Peptide 308.0 02/26/2017: Magnesium 1.8 05/03/2017: BUN 17; Creatinine, Ser 1.06; Hemoglobin 9.9; Platelets 323; Potassium 3.4; Sodium 137    Lipid Panel  No results found for: CHOL, TRIG, HDL, CHOLHDL,  VLDL, LDLCALC, LDLDIRECT   Wt Readings from Last 3 Encounters:  05/21/17 196 lb (88.9 kg)  05/03/17 200 lb 9.6 oz (91 kg)  04/30/17 202 lb (91.6 kg)      Other studies Reviewed: Additional studies/ records that were reviewed today include: TTE 04/25/17  Review of the above records today demonstrates:  - Left ventricle: The cavity size was mildly to moderately dilated.   Wall thickness was normal. The estimated ejection fraction was   approximately 25%. Diffuse hypokinesis. There is akinesis of the   anteroseptal myocardium. There is akinesis of the   basal-midinferior myocardium. Indeterminate diastolic function. - Ventricular septum: Septal motion showed abnormal function and   dyssynergy. - Aortic valve: Mildly to moderately calcified annulus. Trileaflet. - Mitral valve: Mildly calcified annulus. Mildly thickened leaflets   . There was  moderate regurgitation. - Left atrium: The atrium was severely dilated. - Right ventricle: Systolic function was mildly reduced. - Right atrium: The atrium was at the upper limits of normal in   size. Central venous pressure (est): 3 mm Hg. - Atrial septum: No defect or patent foramen ovale was identified. - Tricuspid valve: There was mild regurgitation. - Pulmonary arteries: PA peak pressure: 30 mm Hg (S). - Pericardium, extracardiac: There was no pericardial effusion.  LHC 05/03/17  Hemodynamic findings consistent with mild secondary pulmonary hypertension.  Patient has severe nonischemic cardiomyopathy  Moderate to Severely reduced CO/CI  LV end diastolic pressure is moderately elevated.  Mid RCA stent widely patent  There is mild (2+) mitral regurgitation.   ASSESSMENT AND PLAN:  1.  Persistent atrial fibrillation: Currently on Xarelto.  Previously been on amiodarone but has since been stopped as she was told that it was no longer working.  Based on her age and chronic medical conditions, I feel that she is not a good candidate for ablation.  I did talk to her about the possibility of admission to the hospital and dofetilide loading.  Risks and benefits of the medication were discussed and she is agreed to loading with dofetilide.  This patients CHA2DS2-VASc Score and unadjusted Ischemic Stroke Rate (% per year) is equal to 4.8 % stroke rate/year from a score of 4  Above score calculated as 1 point each if present [CHF, HTN, DM, Vascular=MI/PAD/Aortic Plaque, Age if 65-74, or Female] Above score calculated as 2 points each if present [Age > 75, or Stroke/TIA/TE]  2.  Nonischemic cardiomyopathy: Currently on Toprol-XL.  We Carlynn Leduc start her on 5 mg of lisinopril today.  This may need to be titrated up.  3.  Coronary artery disease: Status post RCA stent in 2014.  Stent was patent at her last catheterization.  No changes.  Current medicines are reviewed at length with the patient  today.   The patient does not have concerns regarding her medicines.  The following changes were made today: Start lisinopril 5 mg  Labs/ tests ordered today include:  No orders of the defined types were placed in this encounter.  Case discussed with primary cardiology  Disposition:   FU with Cari Vandeberg 3 months  Signed, Carnel Stegman Meredith Leeds, MD  05/21/2017 2:40 PM     [place] [address] [place] [address] [phone] (office) [phone] (fax)

## 2017-05-22 ENCOUNTER — Telehealth: Payer: Self-pay | Admitting: Cardiology

## 2017-05-22 ENCOUNTER — Telehealth: Payer: Self-pay | Admitting: Pharmacist

## 2017-05-22 DIAGNOSIS — Z79899 Other long term (current) drug therapy: Secondary | ICD-10-CM

## 2017-05-22 DIAGNOSIS — I4819 Other persistent atrial fibrillation: Secondary | ICD-10-CM

## 2017-05-22 NOTE — Telephone Encounter (Signed)
Let's plan to to her after the hospital stay.

## 2017-05-22 NOTE — Telephone Encounter (Addendum)
Medication list reviewed in anticipation of upcoming Tikosyn initiation. Patient is not taking any contraindicated or QTc prolonging medications. Would recommend checking an amiodarone level due to recent amiodarone use - will need to be 0.3 or less for Tikosyn start.  Patient is anticoagulated on Xarelto 69m daily. Her CrCl is 561mmin and has consistently been above 5054min this year. She qualifies for Xarelto 54m53mily and ideally should be taking this for 3 weeks without interruption prior to Tikosyn initiation.  Patient will need to be counseled to avoid use of Benadryl while on Tikosyn and in the 2-3 days prior to Tikosyn initiation.

## 2017-05-22 NOTE — Telephone Encounter (Signed)
Daughter Caryl Asp) notified.  OV scheduled for 07/15/2017 [facility] office.  Want to make sure this date is not too far out.  Could not find sooner availability.

## 2017-05-22 NOTE — Telephone Encounter (Signed)
Daughter Caryl Asp) notified that we will put her on the wait list & call to get in earlier if have any cancellations.

## 2017-05-22 NOTE — Telephone Encounter (Signed)
Would try to get her in sooner if a spot opens.

## 2017-05-22 NOTE — Telephone Encounter (Signed)
Please advise when you want to see patient.

## 2017-05-22 NOTE — Telephone Encounter (Signed)
[name] (daughter) called stating that her mother will be admitted to [facility] on 05/28/2017 by Constance Haw, MD. She had upcoming appointment next week with Dr. Domenic Polite. Daughter needs to know when Dr. Domenic Polite wants to see her back.

## 2017-05-23 ENCOUNTER — Telehealth: Payer: Self-pay | Admitting: Cardiology

## 2017-05-23 ENCOUNTER — Ambulatory Visit: Payer: Medicare Other | Admitting: Internal Medicine

## 2017-05-23 MED ORDER — RIVAROXABAN 20 MG PO TABS: 20.0000 mg | ORAL_TABLET | Freq: Every day | ORAL | 6 refills | Status: DC

## 2017-05-23 NOTE — Addendum Note (Signed)
Addended by: Stanton Kidney on: 05/23/2017 11:00 AM   Modules accepted: Orders

## 2017-05-23 NOTE — Telephone Encounter (Signed)
Patient notified

## 2017-05-23 NOTE — Addendum Note (Signed)
Addended by: Stanton Kidney on: 05/23/2017 11:09 AM   Modules accepted: Orders

## 2017-05-23 NOTE — Telephone Encounter (Signed)
Calling about samples

## 2017-05-23 NOTE — Telephone Encounter (Addendum)
Called pt to inform her of Tikosyn adx cancelled for next week.  Explained that she was recently on Amiodarone and we need to confirm her level is low enough before initiating Tikosyn.  Also informed patient that she is taking the wrong dose of Xarelto. Explained that her kidney function qualifies her to be on full dose of 20 mg tablets.  Pt aware 1 mo of samples left in [place] office for her to pick up at her convenience.  Will also send updated Rx to [facility].  Pt does confirm that she can afford Tikosyn when time to start it. (will cost her $53.81/mo)  Pt will stop by [facility] on Monday for Amiodarone level. She is aware I will call her once resulted with updated plan of care. Pt updated that her appt w/ Dr. Domenic Polite, for post cath follow up, has been re-scheduled for next Friday (her original date) at 1pm.  She verbalized understanding.

## 2017-05-27 ENCOUNTER — Telehealth: Payer: Self-pay | Admitting: Cardiology

## 2017-05-27 ENCOUNTER — Ambulatory Visit (INDEPENDENT_AMBULATORY_CARE_PROVIDER_SITE_OTHER): Payer: Medicare Other | Admitting: Internal Medicine

## 2017-05-27 DIAGNOSIS — I4891 Unspecified atrial fibrillation: Secondary | ICD-10-CM | POA: Diagnosis not present

## 2017-05-27 NOTE — Telephone Encounter (Signed)
[place] Message:   Pt says she is supposed to have lab work in [place].She says she needs the name of the lab and the address of it please.

## 2017-05-27 NOTE — Addendum Note (Signed)
Addended by: Harland German A on: 05/27/2017 10:09 AM   Modules accepted: Orders

## 2017-05-27 NOTE — Telephone Encounter (Signed)
Spoke with pt and provided her with the addresses of the 2 [facility] in [place].  Pt appreciative for call.

## 2017-05-28 ENCOUNTER — Ambulatory Visit (HOSPITAL_COMMUNITY): Payer: Medicare Other | Admitting: Nurse Practitioner

## 2017-05-29 LAB — AMIODARONE LEVEL
Amiodarone, Serum: 0.3 ug/mL — ABNORMAL LOW (ref 1.0–2.5)
Noramiodarone,S: 0.3 ug/mL — ABNORMAL LOW (ref 1.0–2.5)

## 2017-05-31 ENCOUNTER — Ambulatory Visit: Payer: Medicare Other | Admitting: Cardiology

## 2017-06-06 ENCOUNTER — Telehealth: Payer: Self-pay | Admitting: Pharmacist

## 2017-06-06 DIAGNOSIS — I4819 Other persistent atrial fibrillation: Secondary | ICD-10-CM

## 2017-06-06 DIAGNOSIS — Z79899 Other long term (current) drug therapy: Secondary | ICD-10-CM

## 2017-06-06 NOTE — Telephone Encounter (Signed)
Pt agreeable to returning to [facility] in [place] on 6/17 for follow up Amiodarone level. She is aware I will be in touch once lab is completed. She is agreeable to plan.

## 2017-06-06 NOTE — Telephone Encounter (Signed)
Medication list reviewed in anticipation of upcoming Tikosyn initiation. Pt had amio level that was 0.3ug/ml and this ideally will need to be less than 0.3 before Tikosyn can be initiated. Would recommend rechecking in 2-4 weeks. Patient is not taking any contraindicated or QTc prolonging medications currently.   Patient is anticoagulated on Xarelto and is now on the appropriate dose (started about 2 weeks ago - will need at least another week of anticoagulation at the appropriate dose before can initiate). Please ensure that patient has not missed any anticoagulation doses in the 3 weeks prior to Tikosyn initiation.   Patient will need to be counseled to avoid use of Benadryl while on Tikosyn and in the 2-3 days prior to Tikosyn initiation.

## 2017-06-08 DIAGNOSIS — K922 Gastrointestinal hemorrhage, unspecified: Secondary | ICD-10-CM

## 2017-06-08 HISTORY — DX: Gastrointestinal hemorrhage, unspecified: K92.2

## 2017-06-10 ENCOUNTER — Encounter (INDEPENDENT_AMBULATORY_CARE_PROVIDER_SITE_OTHER): Payer: Self-pay | Admitting: Internal Medicine

## 2017-06-10 ENCOUNTER — Telehealth: Payer: Self-pay | Admitting: Cardiology

## 2017-06-10 ENCOUNTER — Telehealth (INDEPENDENT_AMBULATORY_CARE_PROVIDER_SITE_OTHER): Payer: Self-pay | Admitting: Internal Medicine

## 2017-06-10 ENCOUNTER — Ambulatory Visit (INDEPENDENT_AMBULATORY_CARE_PROVIDER_SITE_OTHER): Payer: Medicare Other | Admitting: Internal Medicine

## 2017-06-10 VITALS — BP 100/60 | HR 68 | Temp 98.1°F | Ht 63.0 in | Wt 193.1 lb

## 2017-06-10 DIAGNOSIS — K5120 Ulcerative (chronic) proctitis without complications: Secondary | ICD-10-CM | POA: Diagnosis not present

## 2017-06-10 DIAGNOSIS — D508 Other iron deficiency anemias: Secondary | ICD-10-CM

## 2017-06-10 LAB — CBC WITH DIFFERENTIAL/PLATELET
BASOS ABS: 37 {cells}/uL (ref 0–200)
Basophils Relative: 0.5 %
EOS PCT: 1.6 %
Eosinophils Absolute: 117 cells/uL (ref 15–500)
HCT: 23.3 % — ABNORMAL LOW (ref 35.0–45.0)
Hemoglobin: 7 g/dL — ABNORMAL LOW (ref 11.7–15.5)
Lymphs Abs: 1781 cells/uL (ref 850–3900)
MCH: 22.2 pg — ABNORMAL LOW (ref 27.0–33.0)
MCHC: 30 g/dL — AB (ref 32.0–36.0)
MCV: 74 fL — ABNORMAL LOW (ref 80.0–100.0)
MONOS PCT: 9.1 %
MPV: 10.1 fL (ref 7.5–12.5)
NEUTROS ABS: 4701 {cells}/uL (ref 1500–7800)
Neutrophils Relative %: 64.4 %
Platelets: 485 10*3/uL — ABNORMAL HIGH (ref 140–400)
RBC: 3.15 10*6/uL — ABNORMAL LOW (ref 3.80–5.10)
RDW: 17 % — AB (ref 11.0–15.0)
Total Lymphocyte: 24.4 %
WBC mixed population: 664 cells/uL (ref 200–950)
WBC: 7.3 10*3/uL (ref 3.8–10.8)

## 2017-06-10 LAB — SEDIMENTATION RATE: Sed Rate: 33 mm/h — ABNORMAL HIGH (ref 0–30)

## 2017-06-10 NOTE — Telephone Encounter (Signed)
Noted  

## 2017-06-10 NOTE — Progress Notes (Addendum)
Subjective:    Patient ID: [name], female    DOB: 05-Oct-1930, 84 y.o.   MRN: [id number]  HPI Here today for f/u. Last seen in April of 2018. Hx of UC. Maintained on balsalazide 750 mg TID. Diagnosed in 2007. Flex sigmoid in 2007 revealed diffuse colitis up to mid transverse colon. Endoscopic and bx consistent with UC.  Last colonoscopy in 2017 normal.  She is having x 1 a day. She says she has been using a hemorrhoid suppository. She states she occasionally has a black stool, but not often.  She says she is not eating like she should. She says she is not hungry. She is SOB due to her CHF.  She knows she has to eat due to her medications. She has lost 33 pounds since her last visit  in April.    Hx of atrial fib and CHF and maintained on Xarelto  04/25/2017 Echo EF 25.  Hx of Cardiac stents 5 yrs in [place] Hx of bladder tumor. Transurethral resection of bladder tumors in 2018. CBC    Component Value Date/Time   WBC 5.2 05/03/2017 1041   RBC 4.09 05/03/2017 1041   HGB 9.9 (L) 05/03/2017 1041   HCT 31.3 (L) 05/03/2017 1041   PLT 323 05/03/2017 1041   MCV 76.5 (L) 05/03/2017 1041   MCH 24.2 (L) 05/03/2017 1041   MCHC 31.6 05/03/2017 1041   RDW 18.0 (H) 05/03/2017 1041   LYMPHSABS 1.0 03/02/2017 0440   MONOABS 1.0 03/02/2017 0440   EOSABS 0.0 03/02/2017 0440   BASOSABS 0.0 03/02/2017 0440   Erythrocyte Sedimentation Rate     Component Value Date/Time   ESRSEDRATE 16 05/02/2016 1413      06/15/2015 Colonoscopy: UC. Dr. Laural Golden: Impression: - Two 4 to 7 mm polyps in the cecum, removed with a  cold snare. Resected and retrieved. Clip (MR  conditional) was placed. - Three small polyps in the sigmoid colon and at  the splenic flexure. Biopsied. - The entire examined colon is normal on direct and    retroflexion views.  Patient had 5 small polyps removed and they're tubular adenomas.  Review of Systems Past Medical History:  Diagnosis Date  . Arthritis   . Atrial flutter ([place])   . Coronary atherosclerosis of native coronary artery    BMS RCA May 2014 - [place] stent  . Depression   . Essential hypertension, benign   . Hyperlipemia   . Macular degeneration   . Paroxysmal atrial fibrillation (HCC)   . Secondary cardiomyopathy (HCC)    LVEF 35% - improved to 45-50%  . Ulcerative colitis     Past Surgical History:  Procedure Laterality Date  . ABDOMINAL HYSTERECTOMY    . APPENDECTOMY    . Bilateral knee replacements      2007, 2008  . BLADDER SURGERY    . CARDIOVERSION N/A 02/04/2017   Procedure: CARDIOVERSION;  Surgeon: Arnoldo Lenis, MD;  [facility];  Service: Endoscopy;  Laterality: N/A;  . CARDIOVERSION N/A 02/26/2017   Procedure: CARDIOVERSION;  Surgeon: Satira Sark, MD;  [facility];  Service: Cardiovascular;  Laterality: N/A;  . COLONOSCOPY N/A 06/15/2015   Procedure: COLONOSCOPY;  Surgeon: Rogene Houston, MD;  [facility];  Service: Endoscopy;  Laterality: N/A;  210  . CYSTOSCOPY W/ RETROGRADES Bilateral 05/14/2016   Procedure: CYSTOSCOPY WITH BILATERAL RETROGRADE PYELOGRAM;  Surgeon: Raynelle Bring, MD;  [facility];  Service: Urology;  Laterality: Bilateral;  GENERAL ANESTHESIA WITH PARALYSIS  . LEFT HEART CATHETERIZATION WITH CORONARY ANGIOGRAM N/A 10/30/2013   Procedure: LEFT HEART CATHETERIZATION WITH CORONARY ANGIOGRAM;  Surgeon: Burnell Blanks, MD;  Location: [facility] CATH LAB;  Service: Cardiovascular;  Laterality: N/A;  . RIGHT/LEFT HEART CATH AND CORONARY ANGIOGRAPHY N/A 05/03/2017   Procedure: RIGHT/LEFT HEART CATH AND CORONARY ANGIOGRAPHY;  Surgeon: Leonie Man, MD;  Location: [place] CV LAB;  Service: Cardiovascular;  Laterality: N/A;  . TEE WITHOUT CARDIOVERSION N/A  02/04/2017   Procedure: TRANSESOPHAGEAL ECHOCARDIOGRAM (TEE) WITH PROPOL;  Surgeon: Arnoldo Lenis, MD;  [facility];  Service: Endoscopy;  Laterality: N/A;  . TONSILLECTOMY    . TOTAL KNEE ARTHROPLASTY    . TRANSURETHRAL RESECTION OF BLADDER TUMOR N/A 05/14/2016   Procedure: TRANSURETHRAL RESECTION OF BLADDER TUMOR (TURBT);  Surgeon: Raynelle Bring, MD;  [facility];  Service: Urology;  Laterality: N/A;  GENERAL ANESTHESIA WITH PARALYSIS  . YAG LASER APPLICATION Bilateral 86/07/6718   Procedure: YAG LASER APPLICATION;  Surgeon: Williams Che, MD;  Location[facility];  Service: Ophthalmology;  Laterality: Bilateral;    Allergies  Allergen Reactions  . Sinus & Allergy [Chlorpheniramine-Phenylephrine] Shortness Of Breath  . Demerol Rash  . Penicillins Rash and Other (See Comments)    REACTION: rash, years ago Has patient had a PCN reaction causing immediate rash, facial/tongue/throat swelling, SOB or lightheadedness with hypotension: Yes Has patient had a PCN reaction causing severe rash involving mucus membranes or skin necrosis: No Has patient had a PCN reaction that required hospitalization No Has patient had a PCN reaction occurring within the last 10 years: No If all of the above answers are "NO", then may proceed with Cephalosporin use.     Current Outpatient Medications on File Prior to Visit  Medication Sig Dispense Refill  . ASPIRIN ADULT LOW STRENGTH 81 MG EC tablet Take 81 mg by mouth daily.  12  . atorvastatin (LIPITOR) 20 MG tablet TAKE ONE TABLET BY MOUTH AT BEDTIME 30 tablet 3  . balsalazide (COLAZAL) 750 MG capsule TAKE THREE CAPSULES BY MOUTH THREE TIMES DAILY (REPLACING DELZICOL) 270 capsule 3  . furosemide (LASIX) 20 MG tablet TAKE THREE TABLETS BY MOUTH TWICE DAILY 192 tablet 3  . ketotifen (THERATEARS ALLERGY) 0.025 % ophthalmic solution Place 2 drops into both eyes 3 (three) times daily as needed (for dry eyes).     Marland Kitchen lisinopril (PRINIVIL,ZESTRIL)  5 MG tablet Take 1 tablet (5 mg total) by mouth daily. 30 tablet 6  . metoprolol succinate (TOPROL-XL) 50 MG 24 hr tablet Take 1 tablet (50 mg total) by mouth 2 (two) times daily with a meal. Take with or immediately following a meal. 180 tablet 1  . Multiple Vitamins-Minerals (ICAPS AREDS 2 PO) Take 2 tablets by mouth daily at 6 PM.     . potassium chloride (K-DUR,KLOR-CON) 10 MEQ tablet TAKE THREE TABLETS BY MOUTH THREE TIMES DAILY 270 tablet 3  . rivaroxaban (XARELTO) 20 MG TABS tablet Take 1 tablet (20 mg total) by mouth daily with supper. 30 tablet 6  . nitroGLYCERIN (NITROSTAT) 0.4 MG SL tablet Place 1 tablet (0.4 mg total) under the tongue every 5 (five) minutes as needed for chest pain. 25 tablet 3   No current facility-administered medications on file prior to visit.         Objective:   Physical Exam Blood pressure 100/60, pulse 68, temperature 98.1 F (36.7 C), height 5' 3"  (1.6 m), weight 193 lb 1.6 oz (87.6  kg). Alert and oriented. Skin warm and dry. Color pale. Oral mucosa is moist.   . Sclera anicteric, conjunctivae is pink. Thyroid not enlarged. No cervical lymphadenopathy. Lungs clear. Heart regular rate and rhythm.  Abdomen is soft. Bowel sounds are positive. No hepatomegaly. No abdominal masses felt. No tenderness.  No edema to lower extremities.           Assessment & Plan:  UC. She is doing well. She seems to be in remission. Will get a CBC and sedrate. OV in 1 year.

## 2017-06-10 NOTE — Patient Instructions (Signed)
CBC and sedrate.

## 2017-06-10 NOTE — Telephone Encounter (Signed)
Wanted Dr Domenic Polite to know that she will be going to [facility] to get blood due .7 instead of being  .12 per [name] instructions

## 2017-06-10 NOTE — Telephone Encounter (Signed)
Iron studies ordered

## 2017-06-11 ENCOUNTER — Telehealth (INDEPENDENT_AMBULATORY_CARE_PROVIDER_SITE_OTHER): Payer: Self-pay | Admitting: Internal Medicine

## 2017-06-11 ENCOUNTER — Telehealth (INDEPENDENT_AMBULATORY_CARE_PROVIDER_SITE_OTHER): Payer: Self-pay | Admitting: *Deleted

## 2017-06-11 ENCOUNTER — Other Ambulatory Visit (INDEPENDENT_AMBULATORY_CARE_PROVIDER_SITE_OTHER): Payer: Self-pay | Admitting: Internal Medicine

## 2017-06-11 DIAGNOSIS — K921 Melena: Secondary | ICD-10-CM

## 2017-06-11 DIAGNOSIS — D508 Other iron deficiency anemias: Secondary | ICD-10-CM

## 2017-06-11 DIAGNOSIS — D649 Anemia, unspecified: Secondary | ICD-10-CM | POA: Insufficient documentation

## 2017-06-11 LAB — IRON,TIBC AND FERRITIN PANEL
%SAT: 1 % (calc) — ABNORMAL LOW (ref 11–50)
FERRITIN: 19 ng/mL — AB (ref 20–288)
Iron: <10 ug/dL — ABNORMAL LOW (ref 45–160)
TIBC: 385 ug/dL (ref 250–450)

## 2017-06-11 NOTE — Telephone Encounter (Signed)
Message fwd to Dr. Curt Bears for his review - please see Dr. Myles Gip message below.

## 2017-06-11 NOTE — Telephone Encounter (Signed)
Patient is scheduled for endoscopy 06/25/17 and needs to stop  Xarelto and ASA 2 days prior -- please advise if ok to stop, thanks

## 2017-06-11 NOTE — Telephone Encounter (Signed)
EGD sch'd 06/25/17 at 730 ([phone]), patient aware, verbal instructions given to patient

## 2017-06-11 NOTE — Telephone Encounter (Signed)
I am not sure about the specific timing of her planned Tikosyn initiation in the hospital, but she should not be stopping Xarelto during this time course since she may convert to sinus rhythm and would need to be anticoagulated at least 3 weeks prior to that.  Please check with Dr. Curt Bears and the EP practice in terms of these plans.  In addition, she should be off aspirin at this time.

## 2017-06-11 NOTE — Telephone Encounter (Signed)
EGD with propofol.  ON xarelto

## 2017-06-11 NOTE — Telephone Encounter (Signed)
Will fwd to Dr. Domenic Polite for answer.

## 2017-06-12 ENCOUNTER — Telehealth (INDEPENDENT_AMBULATORY_CARE_PROVIDER_SITE_OTHER): Payer: Self-pay | Admitting: Internal Medicine

## 2017-06-12 ENCOUNTER — Other Ambulatory Visit: Payer: Self-pay

## 2017-06-12 ENCOUNTER — Encounter (HOSPITAL_COMMUNITY): Payer: Self-pay

## 2017-06-12 ENCOUNTER — Inpatient Hospital Stay (HOSPITAL_COMMUNITY)
Admission: EM | Admit: 2017-06-12 | Discharge: 2017-06-15 | DRG: 394 | Disposition: A | Discharge status: Home / Self Care | Payer: Medicare Other | Attending: Family Medicine | Admitting: Family Medicine

## 2017-06-12 DIAGNOSIS — Z90710 Acquired absence of both cervix and uterus: Secondary | ICD-10-CM

## 2017-06-12 DIAGNOSIS — I959 Hypotension, unspecified: Secondary | ICD-10-CM | POA: Diagnosis not present

## 2017-06-12 DIAGNOSIS — Z8249 Family history of ischemic heart disease and other diseases of the circulatory system: Secondary | ICD-10-CM

## 2017-06-12 DIAGNOSIS — I509 Heart failure, unspecified: Secondary | ICD-10-CM | POA: Diagnosis not present

## 2017-06-12 DIAGNOSIS — Z96653 Presence of artificial knee joint, bilateral: Secondary | ICD-10-CM | POA: Diagnosis not present

## 2017-06-12 DIAGNOSIS — I5042 Chronic combined systolic (congestive) and diastolic (congestive) heart failure: Secondary | ICD-10-CM | POA: Diagnosis not present

## 2017-06-12 DIAGNOSIS — K921 Melena: Secondary | ICD-10-CM | POA: Diagnosis present

## 2017-06-12 DIAGNOSIS — Z7982 Long term (current) use of aspirin: Secondary | ICD-10-CM | POA: Diagnosis not present

## 2017-06-12 DIAGNOSIS — K449 Diaphragmatic hernia without obstruction or gangrene: Secondary | ICD-10-CM | POA: Diagnosis present

## 2017-06-12 DIAGNOSIS — I2510 Atherosclerotic heart disease of native coronary artery without angina pectoris: Secondary | ICD-10-CM | POA: Diagnosis not present

## 2017-06-12 DIAGNOSIS — D179 Benign lipomatous neoplasm, unspecified: Secondary | ICD-10-CM | POA: Diagnosis not present

## 2017-06-12 DIAGNOSIS — E782 Mixed hyperlipidemia: Secondary | ICD-10-CM | POA: Diagnosis not present

## 2017-06-12 DIAGNOSIS — E440 Moderate protein-calorie malnutrition: Secondary | ICD-10-CM | POA: Diagnosis present

## 2017-06-12 DIAGNOSIS — Z6830 Body mass index (BMI) 30.0-30.9, adult: Secondary | ICD-10-CM

## 2017-06-12 DIAGNOSIS — I481 Persistent atrial fibrillation: Secondary | ICD-10-CM | POA: Diagnosis present

## 2017-06-12 DIAGNOSIS — I10 Essential (primary) hypertension: Secondary | ICD-10-CM | POA: Diagnosis not present

## 2017-06-12 DIAGNOSIS — K633 Ulcer of intestine: Secondary | ICD-10-CM | POA: Diagnosis not present

## 2017-06-12 DIAGNOSIS — I110 Hypertensive heart disease with heart failure: Secondary | ICD-10-CM | POA: Diagnosis present

## 2017-06-12 DIAGNOSIS — I480 Paroxysmal atrial fibrillation: Secondary | ICD-10-CM | POA: Diagnosis not present

## 2017-06-12 DIAGNOSIS — H3530 Unspecified macular degeneration: Secondary | ICD-10-CM | POA: Diagnosis present

## 2017-06-12 DIAGNOSIS — Z8551 Personal history of malignant neoplasm of bladder: Secondary | ICD-10-CM

## 2017-06-12 DIAGNOSIS — I482 Chronic atrial fibrillation: Secondary | ICD-10-CM | POA: Diagnosis present

## 2017-06-12 DIAGNOSIS — D649 Anemia, unspecified: Secondary | ICD-10-CM | POA: Diagnosis not present

## 2017-06-12 DIAGNOSIS — I4891 Unspecified atrial fibrillation: Secondary | ICD-10-CM | POA: Diagnosis present

## 2017-06-12 DIAGNOSIS — I428 Other cardiomyopathies: Secondary | ICD-10-CM | POA: Diagnosis not present

## 2017-06-12 DIAGNOSIS — I9589 Other hypotension: Secondary | ICD-10-CM | POA: Diagnosis not present

## 2017-06-12 DIAGNOSIS — I25119 Atherosclerotic heart disease of native coronary artery with unspecified angina pectoris: Secondary | ICD-10-CM | POA: Diagnosis not present

## 2017-06-12 DIAGNOSIS — T462X5A Adverse effect of other antidysrhythmic drugs, initial encounter: Secondary | ICD-10-CM | POA: Diagnosis present

## 2017-06-12 DIAGNOSIS — Z7901 Long term (current) use of anticoagulants: Secondary | ICD-10-CM

## 2017-06-12 DIAGNOSIS — D500 Iron deficiency anemia secondary to blood loss (chronic): Secondary | ICD-10-CM | POA: Diagnosis not present

## 2017-06-12 DIAGNOSIS — I429 Cardiomyopathy, unspecified: Secondary | ICD-10-CM | POA: Diagnosis not present

## 2017-06-12 LAB — COMPREHENSIVE METABOLIC PANEL
ALT: 8 U/L — ABNORMAL LOW (ref 14–54)
ANION GAP: 10 (ref 5–15)
AST: 19 U/L (ref 15–41)
Albumin: 3.5 g/dL (ref 3.5–5.0)
Alkaline Phosphatase: 48 U/L (ref 38–126)
BILIRUBIN TOTAL: 1.2 mg/dL (ref 0.3–1.2)
BUN: 25 mg/dL — ABNORMAL HIGH (ref 6–20)
CO2: 24 mmol/L (ref 22–32)
Calcium: 9.1 mg/dL (ref 8.9–10.3)
Chloride: 102 mmol/L (ref 101–111)
Creatinine, Ser: 0.95 mg/dL (ref 0.44–1.00)
GFR calc Af Amer: >60 mL/min (ref >60)
GFR, EST NON AFRICAN AMERICAN: 53 mL/min — AB (ref >60)
Glucose, Bld: 100 mg/dL — ABNORMAL HIGH (ref 65–99)
POTASSIUM: 3.9 mmol/L (ref 3.5–5.1)
Sodium: 136 mmol/L (ref 135–145)
TOTAL PROTEIN: 6.5 g/dL (ref 6.5–8.1)

## 2017-06-12 LAB — CBC WITH DIFFERENTIAL/PLATELET
BASOS ABS: 0 10*3/uL (ref 0.0–0.1)
Basophils Relative: 1 %
Eosinophils Absolute: 0.2 10*3/uL (ref 0.0–0.7)
Eosinophils Relative: 3 %
HEMATOCRIT: 22.2 % — AB (ref 36.0–46.0)
Hemoglobin: 6.8 g/dL — CL (ref 12.0–15.0)
LYMPHS PCT: 23 %
Lymphs Abs: 1.4 10*3/uL (ref 0.7–4.0)
MCH: 21.9 pg — ABNORMAL LOW (ref 26.0–34.0)
MCHC: 30.6 g/dL (ref 30.0–36.0)
MCV: 71.6 fL — AB (ref 78.0–100.0)
Monocytes Absolute: 0.7 10*3/uL (ref 0.1–1.0)
Monocytes Relative: 11 %
NEUTROS ABS: 3.8 10*3/uL (ref 1.7–7.7)
Neutrophils Relative %: 62 %
Platelets: 465 10*3/uL — ABNORMAL HIGH (ref 150–400)
RBC: 3.1 MIL/uL — AB (ref 3.87–5.11)
RDW: 17.9 % — ABNORMAL HIGH (ref 11.5–15.5)
WBC: 6.2 10*3/uL (ref 4.0–10.5)

## 2017-06-12 LAB — URINALYSIS, ROUTINE W REFLEX MICROSCOPIC
Bilirubin Urine: NEGATIVE
GLUCOSE, UA: NEGATIVE mg/dL
Hgb urine dipstick: NEGATIVE
Ketones, ur: NEGATIVE mg/dL
LEUKOCYTES UA: NEGATIVE
Nitrite: NEGATIVE
PROTEIN: NEGATIVE mg/dL
Specific Gravity, Urine: 1.008 (ref 1.005–1.030)
pH: 7 (ref 5.0–8.0)

## 2017-06-12 LAB — TROPONIN I: Troponin I: <0.03 ng/mL (ref <0.03)

## 2017-06-12 LAB — PREPARE RBC (CROSSMATCH)

## 2017-06-12 MED ORDER — SODIUM CHLORIDE 0.9% FLUSH
3.0000 mL | Freq: Two times a day (BID) | INTRAVENOUS | Status: DC
  Administered 2017-06-12 – 2017-06-15 (×5): 3 mL via INTRAVENOUS

## 2017-06-12 MED ORDER — SODIUM CHLORIDE 0.9 % IV BOLUS
250.0000 mL | Freq: Once | INTRAVENOUS | Status: AC
  Administered 2017-06-12: 250 mL via INTRAVENOUS

## 2017-06-12 MED ORDER — FAMOTIDINE IN NACL 20-0.9 MG/50ML-% IV SOLN
20.0000 mg | Freq: Two times a day (BID) | INTRAVENOUS | Status: AC
  Administered 2017-06-12 – 2017-06-13 (×4): 20 mg via INTRAVENOUS
  Filled 2017-06-12 (×4): qty 50

## 2017-06-12 MED ORDER — SODIUM CHLORIDE 0.9 % IV SOLN
10.0000 mL/h | Freq: Once | INTRAVENOUS | Status: AC
  Administered 2017-06-12: 10 mL/h via INTRAVENOUS

## 2017-06-12 MED ORDER — SODIUM CHLORIDE 0.9 % IV SOLN: 250.0000 mL | INTRAVENOUS | Status: DC | PRN

## 2017-06-12 MED ORDER — ACETAMINOPHEN 650 MG RE SUPP: 650.0000 mg | Freq: Four times a day (QID) | RECTAL | Status: DC | PRN

## 2017-06-12 MED ORDER — ACETAMINOPHEN 325 MG PO TABS
650.0000 mg | ORAL_TABLET | Freq: Four times a day (QID) | ORAL | Status: DC | PRN
  Filled 2017-06-12: qty 2

## 2017-06-12 MED ORDER — SODIUM CHLORIDE 0.9% FLUSH: 3.0000 mL | INTRAVENOUS | Status: DC | PRN

## 2017-06-12 MED ORDER — ENSURE ENLIVE PO LIQD: 237.0000 mL | Freq: Two times a day (BID) | ORAL | Status: DC

## 2017-06-12 MED ORDER — SODIUM CHLORIDE 0.9 % IV BOLUS: 500.0000 mL | Freq: Once | INTRAVENOUS | Status: DC

## 2017-06-12 NOTE — ED Notes (Signed)
Date and time results received: 06/12/17 1010 (use smartphrase ".now" to insert current time)  Test: hemeglobin Critical Value: 6.8  Name of Provider Notified: KM  Orders Received? Or Actions Taken?: none at this time

## 2017-06-12 NOTE — ED Provider Notes (Signed)
[facility] EMERGENCY DEPARTMENT Provider Note   CSN: [id number] Arrival date & time: 06/12/17  0911     History   Chief Complaint Chief Complaint  Patient presents with  . GI Bleeding    HPI [name] is a 84 y.o. female.     Pt was seen at 0925. Per pt, c/o gradual onset and persistence of constant "generalized weakness" for the past several months. Pt was evaluated by her GI MD 2 days ago with labs and stool sample. States she was called today and told to come to the ED for further evaluation/admission for "low Hgb" and "heme positive stools." Endorses hx xarelto use, with LD this morning. Endorses intermittent "black" stools. Denies abd pain, no red blood in stools, no CP/SOB, no cough, no fevers, no back pain.   Past Medical History:  Diagnosis Date  . Arthritis   . Atrial flutter ([place])   . Cancer [facility])    bladder cancer  . Coronary atherosclerosis of native coronary artery    BMS RCA May 2014 - [place] stent  . Depression   . Essential hypertension, benign   . Hyperlipemia   . Macular degeneration   . Paroxysmal atrial fibrillation (HCC)   . Secondary cardiomyopathy (HCC)    LVEF 35% - improved to 45-50%  . Ulcerative colitis     Patient Active Problem List   Diagnosis Date Noted  . Absolute anemia 06/11/2017  . Melena 06/11/2017  . Influenza A 03/05/2017  . Chronic combined systolic and diastolic CHF, NYHA class 3 ([place]) 02/20/2017  . Acute dyspnea   . Chest pain 02/01/2017  . Acute systolic CHF (congestive heart failure) ([place]) 01/21/2017  . Atrial fibrillation with rapid ventricular response ([place]) 01/02/2017  . Arthritis of midfoot 01/13/2016  . Right shoulder pain 01/13/2016  . Ulcerative colitis ([place]) 05/03/2014  . Bilateral leg edema 11/30/2013  . Abnormal myocardial perfusion study 09/17/2013  . Coronary atherosclerosis of native coronary artery 09/03/2012  . Secondary cardiomyopathy ([place]) 09/03/2012  . Mixed hyperlipidemia 01/21/2009  .  Essential hypertension, benign 01/21/2009  . Atrial flutter ([place]) 10/06/2008    Past Surgical History:  Procedure Laterality Date  . ABDOMINAL HYSTERECTOMY    . APPENDECTOMY    . Bilateral knee replacements      2007, 2008  . BLADDER SURGERY    . CARDIOVERSION N/A 02/04/2017   Procedure: CARDIOVERSION;  Surgeon: Arnoldo Lenis, MD;  [facility];  Service: Endoscopy;  Laterality: N/A;  . CARDIOVERSION N/A 02/26/2017   Procedure: CARDIOVERSION;  Surgeon: Satira Sark, MD;  Location: [facility] ORS;  Service: Cardiovascular;  Laterality: N/A;  . COLONOSCOPY N/A 06/15/2015   Procedure: COLONOSCOPY;  Surgeon: Rogene Houston, MD;  Location[facility] ENDO [facility];  Service: Endoscopy;  Laterality: N/A;  210  . CYSTOSCOPY W/ RETROGRADES Bilateral 05/14/2016   Procedure: CYSTOSCOPY WITH BILATERAL RETROGRADE PYELOGRAM;  Surgeon: Raynelle Bring, MD;  Location: [facility] ORS;  Service: Urology;  Laterality: Bilateral;  GENERAL ANESTHESIA WITH PARALYSIS  . LEFT HEART CATHETERIZATION WITH CORONARY ANGIOGRAM N/A 10/30/2013   Procedure: LEFT HEART CATHETERIZATION WITH CORONARY ANGIOGRAM;  Surgeon: Burnell Blanks, MD;  Location: [facility] CATH LAB;  Service: Cardiovascular;  Laterality: N/A;  . RIGHT/LEFT HEART CATH AND CORONARY ANGIOGRAPHY N/A 05/03/2017   Procedure: RIGHT/LEFT HEART CATH AND CORONARY ANGIOGRAPHY;  Surgeon: Leonie Man, MD;  Location: [place] CV LAB;  Service: Cardiovascular;  Laterality: N/A;  . TEE WITHOUT CARDIOVERSION N/A 02/04/2017   Procedure: TRANSESOPHAGEAL ECHOCARDIOGRAM (  TEE) WITH PROPOL;  Surgeon: Arnoldo Lenis, MD;  [facility];  Service: Endoscopy;  Laterality: N/A;  . TONSILLECTOMY    . TOTAL KNEE ARTHROPLASTY    . TRANSURETHRAL RESECTION OF BLADDER TUMOR N/A 05/14/2016   Procedure: TRANSURETHRAL RESECTION OF BLADDER TUMOR (TURBT);  Surgeon: Raynelle Bring, MD;  [facility];  Service: Urology;  Laterality: N/A;  GENERAL ANESTHESIA WITH PARALYSIS  .  YAG LASER APPLICATION Bilateral 03/13/91   Procedure: YAG LASER APPLICATION;  Surgeon: Williams Che, MD;  Location: [facility] ORS;  Service: Ophthalmology;  Laterality: Bilateral;     OB History   None      Home Medications    Prior to Admission medications   Medication Sig Start Date End Date Taking? Authorizing Provider  ASPIRIN ADULT LOW STRENGTH 81 MG EC tablet Take 81 mg by mouth daily. 01/25/17   [provider]  atorvastatin (LIPITOR) 20 MG tablet TAKE ONE TABLET BY MOUTH AT BEDTIME 02/14/17   Satira Sark, MD  balsalazide (COLAZAL) 750 MG capsule TAKE THREE CAPSULES BY MOUTH THREE TIMES DAILY (REPLACING DELZICOL) 12/09/16   Rogene Houston, MD  furosemide (LASIX) 20 MG tablet TAKE THREE TABLETS BY MOUTH TWICE DAILY 04/04/17   Satira Sark, MD  ketotifen [facility] ALLERGY) 0.025 % ophthalmic solution Place 2 drops into both eyes 3 (three) times daily as needed (for dry eyes).     [provider]  lisinopril (PRINIVIL,ZESTRIL) 5 MG tablet Take 1 tablet (5 mg total) by mouth daily. 05/21/17 08/19/17  Camnitz, Ocie Doyne, MD  metoprolol succinate (TOPROL-XL) 50 MG 24 hr tablet Take 1 tablet (50 mg total) by mouth 2 (two) times daily with a meal. Take with or immediately following a meal. 04/24/17   Satira Sark, MD  Multiple Vitamins-Minerals (ICAPS AREDS 2 PO) Take 2 tablets by mouth daily at 6 PM.     [provider]  nitroGLYCERIN (NITROSTAT) 0.4 MG SL tablet Place 1 tablet (0.4 mg total) under the tongue every 5 (five) minutes as needed for chest pain. 02/06/17 05/07/17  Satira Sark, MD  potassium chloride (K-DUR,KLOR-CON) 10 MEQ tablet TAKE THREE TABLETS BY MOUTH THREE TIMES DAILY 04/04/17   Satira Sark, MD  rivaroxaban (XARELTO) 20 MG TABS tablet Take 1 tablet (20 mg total) by mouth daily with supper. 05/23/17   Camnitz, Ocie Doyne, MD    Family History Family History  Problem Relation Age of Onset  . CAD Father     Social  History Social History   Tobacco Use  . Smoking status: Never Smoker  . Smokeless tobacco: Never Used  Substance Use Topics  . Alcohol use: No    Alcohol/week: 0.0 oz  . Drug use: No     Allergies   Sinus & allergy [chlorpheniramine-phenylephrine]; Demerol; and Penicillins   Review of Systems Review of Systems ROS: Statement: All systems negative except as marked or noted in the HPI; Constitutional: +generalized weakness/fatigue. Negative for fever and chills. ; ; Eyes: Negative for eye pain, redness and discharge. ; ; ENMT: Negative for ear pain, hoarseness, nasal congestion, sinus pressure and sore throat. ; ; Cardiovascular: Negative for chest pain, palpitations, diaphoresis, dyspnea and peripheral edema. ; ; Respiratory: Negative for cough, wheezing and stridor. ; ; Gastrointestinal: +heme positive stools, intermittent black stools. Negative for nausea, vomiting, diarrhea, abdominal pain, blood in stool, hematemesis, jaundice and rectal bleeding. . ; ; Genitourinary: Negative for dysuria, flank pain and hematuria. ; ; Musculoskeletal:  Negative for back pain and neck pain. Negative for swelling and trauma.; ; Skin: Negative for pruritus, rash, abrasions, blisters, bruising and skin lesion.; ; Neuro: Negative for headache, lightheadedness and neck stiffness. Negative for altered level of consciousness, altered mental status, extremity weakness, paresthesias, involuntary movement, seizure and syncope.       Physical Exam Updated Vital Signs BP 98/74 (BP Location: Left Arm)   Pulse 84   Temp 98.6 F (37 C) (Oral)   Resp 18   Ht 5' 7"  (1.702 m)   Wt 87.8 kg (193 lb 9.6 oz)   SpO2 99%   BMI 30.32 kg/m    Patient Vitals for the past 24 hrs:  BP Temp Temp src Pulse Resp SpO2 Height Weight  06/12/17 1030 100/67 - - (!) 59 18 97 % - -  06/12/17 1015 (!) 97/56 - - - 14 - - -  06/12/17 0926 98/74 98.6 F (37 C) Oral 84 18 99 % - -  06/12/17 6283 - - - - - - 5' 7"  (1.702 m) 87.8 kg  (193 lb 9.6 oz)   10:13:47 Orthostatic Vital Signs MM  Orthostatic Lying   BP- Lying: 92/49   Pulse- Lying: 77       Orthostatic Sitting  BP- Sitting: 97/56   Pulse- Sitting: 82       Orthostatic Standing at 0 minutes  BP- Standing at 0 minutes: 101/84   Pulse- Standing at 0 minutes: 87      Physical Exam 0930: Physical examination:  Nursing notes reviewed; Vital signs and O2 SAT reviewed;  Constitutional: Well developed, Well nourished, Well hydrated, In no acute distress; Head:  Normocephalic, atraumatic; Eyes: EOMI, PERRL, No scleral icterus; ENMT: Mouth and pharynx normal, Mucous membranes moist; Neck: Supple, Full range of motion, No lymphadenopathy; Cardiovascular: Irregular rate and rhythm, No gallop; Respiratory: Breath sounds clear & equal bilaterally, No wheezes.  Speaking full sentences with ease, Normal respiratory effort/excursion; Chest: Nontender, Movement normal; Abdomen: Soft, Nontender, Nondistended, Normal bowel sounds; Genitourinary: No CVA tenderness; Extremities: Peripheral pulses normal, No tenderness, +2 pedal edema bilat. No calf asymmetry.; Neuro: AA&Ox3, Major CN grossly intact.  Speech clear. No gross focal motor or sensory deficits in extremities.; Skin: Color pale, Warm, Dry.   ED Treatments / Results  Labs (all labs ordered are listed, but only abnormal results are displayed)   EKG EKG Interpretation  Date/Time:  Wednesday June 12 2017 09:31:13 EDT Ventricular Rate:  79 PR Interval:    QRS Duration: 161 QT Interval:  416 QTC Calculation: 477 R Axis:   177 Text Interpretation:  Atrial fibrillation Nonspecific intraventricular conduction delay When compared with ECG of 02/20/2017 No significant change was found Confirmed by Francine Graven [phone]) on 06/12/2017 9:43:06 AM   Radiology   Procedures Procedures (including critical care time)  Medications Ordered in ED Medications - No data to display   Initial Impression / Assessment and  Plan / ED Course  I have reviewed the triage vital signs and the nursing notes.  Pertinent labs & imaging results that were available during my care of the patient were reviewed by me and considered in my medical decision making (see chart for details).  MDM Reviewed: previous chart, nursing note and vitals Reviewed previous: labs and ECG Interpretation: labs and ECG Total time providing critical care: 30-74 minutes. This excludes time spent performing separately reportable procedures and services. Consults: gastrointestinal and admitting MD   CRITICAL CARE Performed by: Alfonzo Feller Total critical care time: 66  minutes Critical care time was exclusive of separately billable procedures and treating other patients. Critical care was necessary to treat or prevent imminent or life-threatening deterioration. Critical care was time spent personally by me on the following activities: development of treatment plan with patient and/or surrogate as well as nursing, discussions with consultants, evaluation of patient's response to treatment, examination of patient, obtaining history from patient or surrogate, ordering and performing treatments and interventions, ordering and review of laboratory studies, ordering and review of radiographic studies, pulse oximetry and re-evaluation of patient's condition.  Results for orders placed or performed during the hospital encounter of 06/12/17  Comprehensive metabolic panel  Result Value Ref Range   Sodium 136 135 - 145 mmol/L   Potassium 3.9 3.5 - 5.1 mmol/L   Chloride 102 101 - 111 mmol/L   CO2 24 22 - 32 mmol/L   Glucose, Bld 100 (H) 65 - 99 mg/dL   BUN 25 (H) 6 - 20 mg/dL   Creatinine, Ser 0.95 0.44 - 1.00 mg/dL   Calcium 9.1 8.9 - 10.3 mg/dL   Total Protein 6.5 6.5 - 8.1 g/dL   Albumin 3.5 3.5 - 5.0 g/dL   AST 19 15 - 41 U/L   ALT 8 (L) 14 - 54 U/L   Alkaline Phosphatase 48 38 - 126 U/L   Total Bilirubin 1.2 0.3 - 1.2 mg/dL   GFR calc  non Af Amer 53 (L) >60 mL/min   GFR calc Af Amer >60 >60 mL/min   Anion gap 10 5 - 15  CBC with Differential  Result Value Ref Range   WBC 6.2 4.0 - 10.5 K/uL   RBC 3.10 (L) 3.87 - 5.11 MIL/uL   Hemoglobin 6.8 (LL) 12.0 - 15.0 g/dL   HCT 22.2 (L) 36.0 - 46.0 %   MCV 71.6 (L) 78.0 - 100.0 fL   MCH 21.9 (L) 26.0 - 34.0 pg   MCHC 30.6 30.0 - 36.0 g/dL   RDW 17.9 (H) 11.5 - 15.5 %   Platelets 465 (H) 150 - 400 K/uL   Neutrophils Relative % 62 %   Neutro Abs 3.8 1.7 - 7.7 K/uL   Lymphocytes Relative 23 %   Lymphs Abs 1.4 0.7 - 4.0 K/uL   Monocytes Relative 11 %   Monocytes Absolute 0.7 0.1 - 1.0 K/uL   Eosinophils Relative 3 %   Eosinophils Absolute 0.2 0.0 - 0.7 K/uL   Basophils Relative 1 %   Basophils Absolute 0.0 0.0 - 0.1 K/uL  Troponin I  Result Value Ref Range   Troponin I <0.03 <0.03 ng/mL    Results for COURTNEY, BELLIZZI (MRN [id number]) as of 06/12/2017 11:01  Ref. Range 03/01/2017 07:25 03/02/2017 04:40 05/03/2017 10:41 06/10/2017 10:18 06/12/2017 09:38  Hemoglobin Latest Ref Range: 12.0 - 15.0 g/dL 12.0 12.1 9.9 (L) 7.0 (L) 6.8 (LL)  HCT Latest Ref Range: 36.0 - 46.0 % 38.9 38.2 31.3 (L) 23.3 (L) 22.2 (L)    0930:  GI MD called prior to pt arrival: requested admission for transfusion.   1055:  H/H lower than previous. PRBC's ordered to transfuse. Judicious IVF bolus given for hypotension before PRBC transfusion. Pt mentating well, resps easy, NAD.  Dx and testing d/w pt and family.  Questions answered.  Verb understanding, agreeable to admit. T/C returned from [facility]. Wynetta Emery, case discussed, including:  HPI, pertinent PM/SHx, VS/PE, dx testing, ED course and treatment:  Agreeable to admit.      Final Clinical Impressions(s) / ED Diagnoses  Final diagnoses:  None    ED Discharge Orders    None       Francine Graven, DO 06/17/17 1639

## 2017-06-12 NOTE — Progress Notes (Cosign Needed)
History and Physical  [name] TFT:[id number] DOB: 1930/02/16 DOA: 06/12/2017  Referring physician: Dr. Laural Golden PCP: Rory Percy, MD   Chief Complaint: Fatigue and GI bleed  HPI: [name] is a 84 y.o. female with a history of ulcerative colitis paroxysmal atrial fibrillation, congestive heart failure, ulcerative colitis, macular degeneration hypertension, hyperlipidemia, depression, and bladder cancer. She takes Xarelto to manage CHF and Afib. She was sent to the ED by Dr. Olevia Perches office due to positive guaiac test and hemoglobin of 7.0 as of Monday. Patient denies pain, but does state that she has been feeling fatigued and has been having black stools off and on for past week. She states she has had no appetite and has lost 33 lbs since April.   In the ED she presented with blood pressure of 93/66, heart rate of 67, temp of 97.9 F, respirations of 16 and O2 of 100 %. She complained of fatigue and having black stool intermittently. Her EKG showed that she was in stable afib, and lab worked was notable for Hemoglobin of 6.8. Orders were placed for the patient to receive one 1 unit of blood by the EDP and hospitalist consult for admission.   Review of Systems: All systems reviewed and apart from history of presenting illness, are negative.  Past Medical History:  Diagnosis Date  . Arthritis   . Atrial flutter ([place])   . Cancer [facility])    bladder cancer  . Coronary atherosclerosis of native coronary artery    BMS RCA May 2014 - [place] stent  . Depression   . Essential hypertension, benign   . Hyperlipemia   . Macular degeneration   . Paroxysmal atrial fibrillation (H[facility])   . Secondary cardiomyopathy (H[facility])    LVEF 35% - improved to 45-50%  . Ulcerative colitis    Past Surgical History:  Procedure Laterality Date  . ABDOMINAL HYSTERECTOMY    . APPENDECTOMY    . Bilateral knee replacements      2007, 2008  . BLADDER SURGERY    . CARDIOVERSION N/A 02/04/2017   Procedure: CARDIOVERSION;  Surgeon: Arnoldo Lenis, MD;  [facility];  Service: Endoscopy;  Laterality: N/A;  . CARDIOVERSION N/A 02/26/2017   Procedure: CARDIOVERSION;  Surgeon: Satira Sark, MD;  Location: [facility] ORS;  Service: Cardiovascular;  Laterality: N/A;  . COLONOSCOPY N/A 06/15/2015   Procedure: COLONOSCOPY;  Surgeon: Rogene Houston, MD;  Location[facility];  Service: Endoscopy;  Laterality: N/A;  210  . CYSTOSCOPY W/ RETROGRADES Bilateral 05/14/2016   Procedure: CYSTOSCOPY WITH BILATERAL RETROGRADE PYELOGRAM;  Surgeon: Raynelle Bring, MD;  Location: [facility];  Service: Urology;  Laterality: Bilateral;  GENERAL ANESTHESIA WITH PARALYSIS  . LEFT HEART CATHETERIZATION WITH CORONARY ANGIOGRAM N/A 10/30/2013   Procedure: LEFT HEART CATHETERIZATION WITH CORONARY ANGIOGRAM;  Surgeon: Burnell Blanks, MD;  Location: [facility] CATH LAB;  Service: Cardiovascular;  Laterality: N/A;  . RIGHT/LEFT HEART CATH AND CORONARY ANGIOGRAPHY N/A 05/03/2017   Procedure: RIGHT/LEFT HEART CATH AND CORONARY ANGIOGRAPHY;  Surgeon: Leonie Man, MD;  Location: [place] CV LAB;  Service: Cardiovascular;  Laterality: N/A;  . TEE WITHOUT CARDIOVERSION N/A 02/04/2017   Procedure: TRANSESOPHAGEAL ECHOCARDIOGRAM (TEE) WITH PROPOL;  Surgeon: Arnoldo Lenis, MD;  [facility];  Service: Endoscopy;  Laterality: N/A;  . TONSILLECTOMY    . TOTAL KNEE ARTHROPLASTY    . TRANSURETHRAL RESECTION OF BLADDER TUMOR N/A 05/14/2016   Procedure: TRANSURETHRAL RESECTION OF BLADDER TUMOR (TURBT);  Surgeon: Raynelle Bring, MD;  [facility];  Service: Urology;  Laterality: N/A;  GENERAL ANESTHESIA WITH PARALYSIS  . YAG LASER APPLICATION Bilateral 38/01/7709   Procedure: YAG LASER APPLICATION;  Surgeon: Williams Che, MD;  Location: [facility] ORS;  Service: Ophthalmology;  Laterality: Bilateral;   Social History:  reports that she has never smoked. She has never used smokeless tobacco. She reports that  she does not drink alcohol or use drugs.  Allergies  Allergen Reactions  . Sinus & Allergy [Chlorpheniramine-Phenylephrine] Shortness Of Breath  . Demerol Rash  . Penicillins Rash and Other (See Comments)    REACTION: rash, years ago Has patient had a PCN reaction causing immediate rash, facial/tongue/throat swelling, SOB or lightheadedness with hypotension: Yes Has patient had a PCN reaction causing severe rash involving mucus membranes or skin necrosis: No Has patient had a PCN reaction that required hospitalization No Has patient had a PCN reaction occurring within the last 10 years: No If all of the above answers are "NO", then may proceed with Cephalosporin use.     Family History  Problem Relation Age of Onset  . CAD Father     Prior to Admission medications   Medication Sig Start Date End Date Taking? Authorizing Provider  ASPIRIN ADULT LOW STRENGTH 81 MG EC tablet Take 81 mg by mouth daily. 01/25/17   [provider]  atorvastatin (LIPITOR) 20 MG tablet TAKE ONE TABLET BY MOUTH AT BEDTIME 02/14/17   Satira Sark, MD  balsalazide (COLAZAL) 750 MG capsule TAKE THREE CAPSULES BY MOUTH THREE TIMES DAILY (REPLACING DELZICOL) 12/09/16   Rogene Houston, MD  furosemide (LASIX) 20 MG tablet TAKE THREE TABLETS BY MOUTH TWICE DAILY 04/04/17   Satira Sark, MD  ketotifen [facility] ALLERGY) 0.025 % ophthalmic solution Place 2 drops into both eyes 3 (three) times daily as needed (for dry eyes).     [provider]  lisinopril (PRINIVIL,ZESTRIL) 5 MG tablet Take 1 tablet (5 mg total) by mouth daily. 05/21/17 08/19/17  Camnitz, Ocie Doyne, MD  metoprolol succinate (TOPROL-XL) 50 MG 24 hr tablet Take 1 tablet (50 mg total) by mouth 2 (two) times daily with a meal. Take with or immediately following a meal. 04/24/17   Satira Sark, MD  Multiple Vitamins-Minerals (ICAPS AREDS 2 PO) Take 2 tablets by mouth daily at 6 PM.     [provider]  nitroGLYCERIN  (NITROSTAT) 0.4 MG SL tablet Place 1 tablet (0.4 mg total) under the tongue every 5 (five) minutes as needed for chest pain. 02/06/17 05/07/17  Satira Sark, MD  potassium chloride (K-DUR,KLOR-CON) 10 MEQ tablet TAKE THREE TABLETS BY MOUTH THREE TIMES DAILY 04/04/17   Satira Sark, MD  rivaroxaban (XARELTO) 20 MG TABS tablet Take 1 tablet (20 mg total) by mouth daily with supper. 05/23/17   Constance Haw, MD   Physical Exam: Vitals:   06/12/17 1015 06/12/17 1030 06/12/17 1100 06/12/17 1130  BP: (!) 97/56 100/67 (!) 92/55 (!) 110/97  Pulse:  (!) 59 79 (!) 41  Resp: 14 18 17 18   Temp:      TempSrc:      SpO2:  97% 96% (!) 88%  Weight:      Height:         General exam: Moderately built and nourished patient, lying comfortably supine on the gurney in no obvious distress.  Head, eyes and ENT: Nontraumatic and normocephalic. Pupils equally reacting to light and accommodation. Oral mucosa pale,  moist. Nail beds pale, no clubbing.   Neck: Supple. No JVD, carotid bruit or thyromegaly.  Lymphatics: No lymphadenopathy.  Respiratory system: Clear to auscultation. No increased work of breathing.  Cardiovascular system: Afib no RVR. No JVD, murmurs, gallops, clicks or pedal edema.  Gastrointestinal system: Abdomen is nondistended, soft and nontender, obese with protuberance. Normal bowel sounds heard. No organomegaly or masses appreciated.  Central nervous system: Alert and oriented. No focal neurological deficits.  Extremities: Symmetric 5 x 5 power. Peripheral pulses symmetrically felt.   Skin: Pale, no rashes or acute findings.  Musculoskeletal system: Negative exam.  Psychiatry: Pleasant and cooperative.  Labs on Admission:  Basic Metabolic Panel: Recent Labs  Lab 06/12/17 0938  NA 136  K 3.9  CL 102  CO2 24  GLUCOSE 100*  BUN 25*  CREATININE 0.95  CALCIUM 9.1   Liver Function Tests: Recent Labs  Lab 06/12/17 0938  AST 19  ALT 8*  ALKPHOS 48    BILITOT 1.2  PROT 6.5  ALBUMIN 3.5   No results for input(s): LIPASE, AMYLASE in the last 168 hours. No results for input(s): AMMONIA in the last 168 hours. CBC: Recent Labs  Lab 06/10/17 1018 06/12/17 0938  WBC 7.3 6.2  NEUTROABS 4,701 3.8  HGB 7.0* 6.8*  HCT 23.3* 22.2*  MCV 74.0* 71.6*  PLT 485* 465*   Cardiac Enzymes: Recent Labs  Lab 06/12/17 0938  TROPONINI <0.03    BNP (last 3 results) No results for input(s): PROBNP in the last 8760 hours. CBG: No results for input(s): GLUCAP in the last 168 hours.  Radiological Exams on Admission: No results found.  EKG: Independently reviewed.   Assessment/Plan Symptomatic Anemia Atrial fib Chronic combined systolic and diastolic CHF Hypertension  Hyperlipidemia  Melena  1. Symptomatic Anemia - Hemoglobin of 6.8 in ED. This is decrease from hemoglobin of 7.0 on Monday. Patient has been ordered 1 unite of PRBC to be transfused and Pepcid IV. Patient has been taken off of aspirin and Xarelto for now. Consult with GI for GI bleed. Consult to cardiology for monitoring of coagulation therapy for Afib and CHF. 2. Atrial fib - Currently stable. Was on Xarelto. Anticoagulation stopped due to bleeding. Cardiology consult pending. 3. Chronic combined systolic and diastolic CHF - Currently stable. Being treated with Xarelto. Anticoagulation stopped due to bleeding. Cardiology consult pending. 4. Hypertension - Stable will continue home medication while in patient. 5. Hyperlipidemia - Stable, will continue home medication while in patient. 6. Melena - Patient had positive guaiac; GI consult pending.  DVT Prophylaxis: SVC Code Status: FULL CODE  Family Communication: Husband, daughter and sister at bedside.  Disposition Plan: Patient discharge pending hemoglobin repletion and consult with GI and Cardiology.   Time spent: 60 minutes  Ashok Norris, Student AGACNP  Attending:  Irwin Brakeman, MD [facility] Pager  [phone]  If 7PM-7AM, please contact night-coverage [email or web address] Password [email or web address] 06/12/2017, 11:54 AM

## 2017-06-12 NOTE — Telephone Encounter (Signed)
I spoke with Dr. Thurnell Garbe concerning this patient. Guaiac positive stool. Hemoglobin of 7. On Xarelto

## 2017-06-12 NOTE — H&P (Addendum)
History and Physical  [name] HAL:[id number] DOB: 02/15/1930 DOA: 06/12/2017  Referring physician: Dr. Laural Golden PCP: Rory Percy, MD   Chief Complaint: Fatigue and GI bleed  HPI: [name] is a 84 y.o. female with a history of ulcerative colitis paroxysmal atrial fibrillation, congestive heart failure, ulcerative colitis, macular degeneration hypertension, hyperlipidemia, depression, and bladder cancer. She takes Xarelto to manage CHF and Afib. She was sent to the ED by Dr. Olevia Perches office due to positive guaiac test and hemoglobin of 7.0 as of Monday. Patient denies pain, but does state that she has been feeling fatigued and has been having black stools off and on for past week. She states she has had no appetite and has lost 33 lbs since April.   In the ED she presented with blood pressure of 93/66, heart rate of 67, temp of 97.9 F, respirations of 16 and O2 of 100 %. She complained of fatigue and having black stool intermittently. Her EKG showed that she was in stable afib, and lab worked was notable for Hemoglobin of 6.8. Orders were placed for the patient to receive one 1 unit of blood by the EDP and hospitalist consult for admission.   Review of Systems: All systems reviewed and apart from history of presenting illness, are negative.  Past Medical History:  Diagnosis Date  . Arthritis   . Atrial flutter ([place])   . Cancer [facility])    bladder cancer  . Coronary atherosclerosis of native coronary artery    BMS RCA May 2014 - [place] stent  . Depression   . Essential hypertension, benign   . Hyperlipemia   . Macular degeneration   . Paroxysmal atrial fibrillation (HCC)   . Secondary cardiomyopathy (HCC)    LVEF 35% - improved to 45-50%  . Ulcerative colitis    Past Surgical History:  Procedure Laterality Date  . ABDOMINAL HYSTERECTOMY    . APPENDECTOMY    . Bilateral knee replacements      2007, 2008  . BLADDER SURGERY    . CARDIOVERSION N/A 02/04/2017   Procedure: CARDIOVERSION;  Surgeon: Arnoldo Lenis, MD;  [facility];  Service: Endoscopy;  Laterality: N/A;  . CARDIOVERSION N/A 02/26/2017   Procedure: CARDIOVERSION;  Surgeon: Satira Sark, MD;  Location: [facility];  Service: Cardiovascular;  Laterality: N/A;  . COLONOSCOPY N/A 06/15/2015   Procedure: COLONOSCOPY;  Surgeon: Rogene Houston, MD;  [facility];  Service: Endoscopy;  Laterality: N/A;  210  . CYSTOSCOPY W/ RETROGRADES Bilateral 05/14/2016   Procedure: CYSTOSCOPY WITH BILATERAL RETROGRADE PYELOGRAM;  Surgeon: Raynelle Bring, MD;  [facility];  Service: Urology;  Laterality: Bilateral;  GENERAL ANESTHESIA WITH PARALYSIS  . LEFT HEART CATHETERIZATION WITH CORONARY ANGIOGRAM N/A 10/30/2013   Procedure: LEFT HEART CATHETERIZATION WITH CORONARY ANGIOGRAM;  Surgeon: Burnell Blanks, MD;  Location: [facility] CATH LAB;  Service: Cardiovascular;  Laterality: N/A;  . RIGHT/LEFT HEART CATH AND CORONARY ANGIOGRAPHY N/A 05/03/2017   Procedure: RIGHT/LEFT HEART CATH AND CORONARY ANGIOGRAPHY;  Surgeon: Leonie Man, MD;  Location: [place] CV LAB;  Service: Cardiovascular;  Laterality: N/A;  . TEE WITHOUT CARDIOVERSION N/A 02/04/2017   Procedure: TRANSESOPHAGEAL ECHOCARDIOGRAM (TEE) WITH PROPOL;  Surgeon: Arnoldo Lenis, MD;  [facility];  Service: Endoscopy;  Laterality: N/A;  . TONSILLECTOMY    . TOTAL KNEE ARTHROPLASTY    . TRANSURETHRAL RESECTION OF BLADDER TUMOR N/A 05/14/2016   Procedure: TRANSURETHRAL RESECTION OF BLADDER TUMOR (TURBT);  Surgeon: Raynelle Bring, MD;  [facility];  Service: Urology;  Laterality: N/A;  GENERAL ANESTHESIA WITH PARALYSIS  . YAG LASER APPLICATION Bilateral 36/06/4401   Procedure: YAG LASER APPLICATION;  Surgeon: Williams Che, MD;  Location: [facility];  Service: Ophthalmology;  Laterality: Bilateral;   Social History:  reports that she has never smoked. She has never used smokeless tobacco. She reports that  she does not drink alcohol or use drugs.  Allergies  Allergen Reactions  . Sinus & Allergy [Chlorpheniramine-Phenylephrine] Shortness Of Breath  . Demerol Rash  . Penicillins Rash and Other (See Comments)    REACTION: rash, years ago Has patient had a PCN reaction causing immediate rash, facial/tongue/throat swelling, SOB or lightheadedness with hypotension: Yes Has patient had a PCN reaction causing severe rash involving mucus membranes or skin necrosis: No Has patient had a PCN reaction that required hospitalization No Has patient had a PCN reaction occurring within the last 10 years: No If all of the above answers are "NO", then may proceed with Cephalosporin use.     Family History  Problem Relation Age of Onset  . CAD Father     Prior to Admission medications   Medication Sig Start Date End Date Taking? Authorizing Provider  ASPIRIN ADULT LOW STRENGTH 81 MG EC tablet Take 81 mg by mouth daily. 01/25/17   [provider]  atorvastatin (LIPITOR) 20 MG tablet TAKE ONE TABLET BY MOUTH AT BEDTIME 02/14/17   Satira Sark, MD  balsalazide (COLAZAL) 750 MG capsule TAKE THREE CAPSULES BY MOUTH THREE TIMES DAILY (REPLACING DELZICOL) 12/09/16   Rogene Houston, MD  furosemide (LASIX) 20 MG tablet TAKE THREE TABLETS BY MOUTH TWICE DAILY 04/04/17   Satira Sark, MD  ketotifen [facility] ALLERGY) 0.025 % ophthalmic solution Place 2 drops into both eyes 3 (three) times daily as needed (for dry eyes).     [provider]  lisinopril (PRINIVIL,ZESTRIL) 5 MG tablet Take 1 tablet (5 mg total) by mouth daily. 05/21/17 08/19/17  Camnitz, Ocie Doyne, MD  metoprolol succinate (TOPROL-XL) 50 MG 24 hr tablet Take 1 tablet (50 mg total) by mouth 2 (two) times daily with a meal. Take with or immediately following a meal. 04/24/17   Satira Sark, MD  Multiple Vitamins-Minerals (ICAPS AREDS 2 PO) Take 2 tablets by mouth daily at 6 PM.     [provider]  nitroGLYCERIN  (NITROSTAT) 0.4 MG SL tablet Place 1 tablet (0.4 mg total) under the tongue every 5 (five) minutes as needed for chest pain. 02/06/17 05/07/17  Satira Sark, MD  potassium chloride (K-DUR,KLOR-CON) 10 MEQ tablet TAKE THREE TABLETS BY MOUTH THREE TIMES DAILY 04/04/17   Satira Sark, MD  rivaroxaban (XARELTO) 20 MG TABS tablet Take 1 tablet (20 mg total) by mouth daily with supper. 05/23/17   Constance Haw, MD   Physical Exam: Vitals:   06/12/17 1015 06/12/17 1030 06/12/17 1100 06/12/17 1130  BP: (!) 97/56 100/67 (!) 92/55 (!) 110/97  Pulse:  (!) 59 79 (!) 41  Resp: 14 18 17 18   Temp:      TempSrc:      SpO2:  97% 96% (!) 88%  Weight:      Height:         General exam: Moderately built and nourished patient, lying comfortably supine on the gurney in no obvious distress.  Head, eyes and ENT: Nontraumatic and normocephalic. Pupils equally reacting to light and accommodation. Oral mucosa pale,  moist. Nail beds pale, no clubbing.   Neck: Supple. No JVD, carotid bruit or thyromegaly.  Lymphatics: No lymphadenopathy.  Respiratory system: Clear to auscultation. No increased work of breathing.  Cardiovascular system: Afib no RVR. No JVD, murmurs, gallops, clicks or pedal edema.  Gastrointestinal system: Abdomen is nondistended, soft and nontender, obese with protuberance. Normal bowel sounds heard. No organomegaly or masses appreciated.  Central nervous system: Alert and oriented. No focal neurological deficits.  Extremities: Symmetric 5 x 5 power. Peripheral pulses symmetrically felt.   Skin: Pale, no rashes or acute findings.  Musculoskeletal system: Negative exam.  Psychiatry: Pleasant and cooperative.  Labs on Admission:  Basic Metabolic Panel: Recent Labs  Lab 06/12/17 0938  NA 136  K 3.9  CL 102  CO2 24  GLUCOSE 100*  BUN 25*  CREATININE 0.95  CALCIUM 9.1   Liver Function Tests: Recent Labs  Lab 06/12/17 0938  AST 19  ALT 8*  ALKPHOS 48    BILITOT 1.2  PROT 6.5  ALBUMIN 3.5   No results for input(s): LIPASE, AMYLASE in the last 168 hours. No results for input(s): AMMONIA in the last 168 hours. CBC: Recent Labs  Lab 06/10/17 1018 06/12/17 0938  WBC 7.3 6.2  NEUTROABS 4,701 3.8  HGB 7.0* 6.8*  HCT 23.3* 22.2*  MCV 74.0* 71.6*  PLT 485* 465*   Cardiac Enzymes: Recent Labs  Lab 06/12/17 0938  TROPONINI <0.03    BNP (last 3 results) No results for input(s): PROBNP in the last 8760 hours. CBG: No results for input(s): GLUCAP in the last 168 hours.  Radiological Exams on Admission: No results found.  EKG: Independently reviewed.   Assessment/Plan Symptomatic Anemia Atrial fib Chronic combined systolic and diastolic CHF Hypertension  Hyperlipidemia  Melena  1. Symptomatic Anemia - Hemoglobin of 6.8 in ED. This is decrease from hemoglobin of 7.0 on Monday. Patient has been ordered 1 unite of PRBC to be transfused and Pepcid IV. Patient has been taken off of aspirin and Xarelto for now. Consult with GI for GI bleed. Consult to cardiology for monitoring of coagulation therapy for Afib and CHF. 2. Atrial fib - Currently stable. Was on Xarelto. Anticoagulation stopped due to bleeding. Cardiology consult pending. 3. Chronic combined systolic and diastolic CHF - Currently stable. Being treated with Xarelto. Anticoagulation stopped due to bleeding. Cardiology consult pending. 4. Hypertension - Stable will continue home medication while in patient. 5. Hyperlipidemia - Stable, will continue home medication while in patient. 6. Melena - Patient had positive guaiac; GI consult pending.  DVT Prophylaxis: SVC Code Status: FULL CODE  Family Communication: Husband, daughter and sister at bedside.  Disposition Plan: Patient discharge pending hemoglobin repletion and consult with GI and Cardiology.   Time spent: 60 minutes  Ashok Norris, Student AGACNP  Attending:  Pt was seen and examined with NP student Gaston Islam, I agree with the assessment, exam, plan as noted.  Please see admission orders.  Pt admitted with GI bleed, I spoke with GI Dr. Laural Golden, Will also ask for cardiology consult as they were in process of having patient admitted for [place] with EP.  Holding blood thinners for now.  1 unit of PRBC ordered.  Pt appears pale and tired.  Will monitor on telemetry and follow Hg, troponin.   Irwin Brakeman, MD [facility] Pager [phone]  If 7PM-7AM, please contact night-coverage [email or web address] Password [email or web address] 06/12/2017, 11:54 AM

## 2017-06-12 NOTE — ED Notes (Signed)
MD at bedside. 

## 2017-06-12 NOTE — Telephone Encounter (Signed)
Her stool was guaiac positive. Hemoglobin 7.0 as of Monday. I advised her to go to the ED.

## 2017-06-12 NOTE — Progress Notes (Signed)
Cardiology Office Note    Date:  06/17/2017   ID:  Tiarna, Koppen [birth year], MRN [id number]  PCP:  Rory Percy, MD  Cardiologist: Rozann Lesches, MD  No chief complaint on file.   History of Present Illness:  [name] is a 84 y.o. female with history of CAD status post RCA stent in 2014 paroxysmal atrial fibrillation failed cardioversion on amiodarone and nonischemic cardiomyopathy EF 25% and with recent cath showing widely patent RCA stent 2+ MR and severe nonischemic cardiomyopathy. She is being followed by Dr. Curt Bears in the A. fib clinic.  She was not felt to be a good candidate for ablation.  She was referred to the A. fib clinic for dofetilide loading but they are waiting for amiodarone level to return to the low enough level to start Tikosyn.  CHA2DS2-VASc equals 4  In the ED 06/12/2017 with GI bleed hemoglobin of 6.8 and guaiac positive stools EGD felt the bleed was from a small bowel ulcer.  She was advised to stop aspirin and resume Xarelto in 3 days on 06/18/2017.  Telemetry while in the hospital showed atrial fibrillation with heart rate well controlled in the 60s to 90s with occasional pauses longest of 2.0 seconds overnight.  Recent hemoglobin 8.7 on 06/15/2017.  Patient comes in accompanied by her daughter.  Her blood pressures have been running low in the morning and the patient's been taking her medications but feels a little dizzy afterwards if she gets up.  She also has chronic dyspnea on exertion while walking around her house.  Blood pressures have been running low and they are wondering what parameters she needs to hold her medications.  Past Medical History:  Diagnosis Date  . Arthritis   . Atrial flutter ([place])   . Cancer [facility])    bladder cancer  . Coronary atherosclerosis of native coronary artery    BMS RCA May 2014 - [place] stent  . Depression   . Essential hypertension, benign   . Hyperlipemia   . Macular degeneration   . Paroxysmal  atrial fibrillation (HCC)   . Secondary cardiomyopathy (HCC)    LVEF 35% - improved to 45-50%  . Ulcerative colitis     Past Surgical History:  Procedure Laterality Date  . ABDOMINAL HYSTERECTOMY    . APPENDECTOMY    . Bilateral knee replacements      2007, 2008  . BLADDER SURGERY    . CARDIOVERSION N/A 02/04/2017   Procedure: CARDIOVERSION;  Surgeon: Arnoldo Lenis, MD;  [facility];  Service: Endoscopy;  Laterality: N/A;  . CARDIOVERSION N/A 02/26/2017   Procedure: CARDIOVERSION;  Surgeon: Satira Sark, MD;  Location: [facility];  Service: Cardiovascular;  Laterality: N/A;  . COLONOSCOPY N/A 06/15/2015   Procedure: COLONOSCOPY;  Surgeon: Rogene Houston, MD;  [facility];  Service: Endoscopy;  Laterality: N/A;  210  . CYSTOSCOPY W/ RETROGRADES Bilateral 05/14/2016   Procedure: CYSTOSCOPY WITH BILATERAL RETROGRADE PYELOGRAM;  Surgeon: Raynelle Bring, MD;  [facility];  Service: Urology;  Laterality: Bilateral;  GENERAL ANESTHESIA WITH PARALYSIS  . ESOPHAGOGASTRODUODENOSCOPY (EGD) WITH PROPOFOL N/A 06/14/2017   Procedure: ESOPHAGOGASTRODUODENOSCOPY (EGD) WITH PROPOFOL;  Surgeon: Rogene Houston, MD;  [facility];  Service: Endoscopy;  Laterality: N/A;  . GIVENS CAPSULE STUDY  06/14/2017   Procedure: GIVENS CAPSULE STUDY;  Surgeon: Rogene Houston, MD;  [facility];  Service: Endoscopy;;  . LEFT HEART CATHETERIZATION WITH CORONARY ANGIOGRAM N/A 10/30/2013  Procedure: LEFT HEART CATHETERIZATION WITH CORONARY ANGIOGRAM;  Surgeon: Burnell Blanks, MD;  Location: [facility] CATH LAB;  Service: Cardiovascular;  Laterality: N/A;  . RIGHT/LEFT HEART CATH AND CORONARY ANGIOGRAPHY N/A 05/03/2017   Procedure: RIGHT/LEFT HEART CATH AND CORONARY ANGIOGRAPHY;  Surgeon: Leonie Man, MD;  Location: [place] CV LAB;  Service: Cardiovascular;  Laterality: N/A;  . TEE WITHOUT CARDIOVERSION N/A 02/04/2017   Procedure: TRANSESOPHAGEAL ECHOCARDIOGRAM  (TEE) WITH PROPOL;  Surgeon: Arnoldo Lenis, MD;  [facility];  Service: Endoscopy;  Laterality: N/A;  . TONSILLECTOMY    . TOTAL KNEE ARTHROPLASTY    . TRANSURETHRAL RESECTION OF BLADDER TUMOR N/A 05/14/2016   Procedure: TRANSURETHRAL RESECTION OF BLADDER TUMOR (TURBT);  Surgeon: Raynelle Bring, MD;  [facility];  Service: Urology;  Laterality: N/A;  GENERAL ANESTHESIA WITH PARALYSIS  . YAG LASER APPLICATION Bilateral 08/09/4479   Procedure: YAG LASER APPLICATION;  Surgeon: Williams Che, MD;  Location: [facility];  Service: Ophthalmology;  Laterality: Bilateral;    Current Medications: Current Meds  Medication Sig  . acetaminophen (TYLENOL) 500 MG tablet Take 1,000 mg by mouth every 6 (six) hours as needed.  Marland Kitchen atorvastatin (LIPITOR) 20 MG tablet TAKE ONE TABLET BY MOUTH AT BEDTIME  . balsalazide (COLAZAL) 750 MG capsule TAKE THREE CAPSULES BY MOUTH THREE TIMES DAILY (REPLACING DELZICOL)  . ketotifen (THERATEARS ALLERGY) 0.025 % ophthalmic solution Place 2 drops into both eyes 3 (three) times daily as needed (for dry eyes).   Marland Kitchen lisinopril (PRINIVIL,ZESTRIL) 5 MG tablet Take 1 tablet (5 mg total) by mouth daily.  . metoprolol succinate (TOPROL-XL) 50 MG 24 hr tablet Take 1 tablet (50 mg total) by mouth 2 (two) times daily with a meal. Take with or immediately following a meal.  . Multiple Vitamins-Minerals (ICAPS AREDS 2 PO) Take 2 tablets by mouth daily at 6 PM.   . nitroGLYCERIN (NITROSTAT) 0.4 MG SL tablet Place 1 tablet (0.4 mg total) under the tongue every 5 (five) minutes as needed for chest pain.  . potassium chloride (K-DUR,KLOR-CON) 10 MEQ tablet TAKE THREE TABLETS BY MOUTH THREE TIMES DAILY  . [START ON 06/18/2017] rivaroxaban (XARELTO) 20 MG TABS tablet Take 1 tablet (20 mg total) by mouth daily with supper.  . [DISCONTINUED] furosemide (LASIX) 20 MG tablet TAKE THREE TABLETS BY MOUTH TWICE DAILY     Allergies:   Sinus & allergy [chlorpheniramine-phenylephrine];  Demerol; and Penicillins   Social History   Socioeconomic History  . Marital status: Married    Spouse name: Not on file  . Number of children: Not on file  . Years of education: Not on file  . Highest education level: Not on file  Occupational History  . Not on file  Social Needs  . Financial resource strain: Not on file  . Food insecurity:    Worry: Not on file    Inability: Not on file  . Transportation needs:    Medical: Not on file    Non-medical: Not on file  Tobacco Use  . Smoking status: Never Smoker  . Smokeless tobacco: Never Used  Substance and Sexual Activity  . Alcohol use: No    Alcohol/week: 0.0 oz  . Drug use: No  . Sexual activity: Not Currently  Lifestyle  . Physical activity:    Days per week: Not on file    Minutes per session: Not on file  . Stress: Not on file  Relationships  . Social connections:    Talks on  phone: Not on file    Gets together: Not on file    Attends religious service: Not on file    Active member of club or organization: Not on file    Attends meetings of clubs or organizations: Not on file    Relationship status: Not on file  Other Topics Concern  . Not on file  Social History Narrative  . Not on file     Family History:  The patient's family history includes CAD in her father.   ROS:   Please see the history of present illness.    Review of Systems  Constitution: Positive for decreased appetite, malaise/fatigue and weight loss.  HENT: Negative.   Eyes: Negative.   Cardiovascular: Positive for dyspnea on exertion.  Respiratory: Negative.   Hematologic/Lymphatic: Negative.   Musculoskeletal: Negative.  Negative for joint pain.  Gastrointestinal: Negative.   Genitourinary: Negative.   Neurological: Positive for dizziness and weakness.   All other systems reviewed and are negative.   PHYSICAL EXAM:   VS:  BP 104/60   Pulse 89   Wt 194 lb 9.6 oz (88.3 kg)   SpO2 98%   BMI 30.48 kg/m   Physical Exam  GEN:  Well nourished, well developed, in no acute distress  Neck: no JVD, carotid bruits, or masses Cardiac:RRR; 1/6 systolic murmur at the left sternal border respiratory:  clear to auscultation bilaterally, normal work of breathing GI: soft, nontender, nondistended, + BS Ext: without cyanosis, clubbing, or edema, Good distal pulses bilaterally Neuro:  Alert and Oriented x 3 Psych: euthymic mood, full affect  Wt Readings from Last 3 Encounters:  06/17/17 194 lb 9.6 oz (88.3 kg)  06/15/17 193 lb 9.6 oz (87.8 kg)  06/10/17 193 lb 1.6 oz (87.6 kg)      Studies/Labs Reviewed:   EKG:  EKG is not ordered today.   Recent Labs: 02/01/2017: TSH 1.783 02/24/2017: B Natriuretic Peptide 308.0 02/26/2017: Magnesium 1.8 06/14/2017: ALT 8; BUN 15; Creatinine, Ser 0.82; Potassium 3.9; Sodium 137 06/15/2017: Hemoglobin 8.7; Platelets 419   Lipid Panel No results found for: CHOL, TRIG, HDL, CHOLHDL, VLDL, LDLCALC, LDLDIRECT  Additional studies/ records that were reviewed today include:  Cardiac catheterization 4/26/2019Procedures   RIGHT/LEFT HEART CATH AND CORONARY ANGIOGRAPHY  Conclusion      Hemodynamic findings consistent with mild secondary pulmonary hypertension.  Patient has severe nonischemic cardiomyopathy  Moderate to Severely reduced CO/CI  LV end diastolic pressure is moderately elevated.  Mid RCA stent widely patent  There is mild (2+) mitral regurgitation.   Angiographically minimal CAD Moderate to Severely reduced CO/CI with severely reduced EF on Echo   The patient has severe nonischemic cardiomyopathy with reduced EF and reduced output.  Will need aggressive heart failure management in the outpatient setting by Dr. Domenic Polite.   Procedures   RIGHT/LEFT HEART CATH AND CORONARY ANGIOGRAPHY  Conclusion      Hemodynamic findings consistent with mild secondary pulmonary hypertension.  Patient has severe nonischemic cardiomyopathy  Moderate to Severely reduced CO/CI  LV  end diastolic pressure is moderately elevated.  Mid RCA stent widely patent  There is mild (2+) mitral regurgitation.   Angiographically minimal CAD Moderate to Severely reduced CO/CI with severely reduced EF on Echo   The patient has severe nonischemic cardiomyopathy with reduced EF and reduced output.  Will need aggressive heart failure management in the outpatient setting by Dr. Domenic Polite.   Echocardiogram 04/25/2017: Study Conclusions   - Left ventricle: The cavity size was mildly to  moderately dilated.   Wall thickness was normal. The estimated ejection fraction was   approximately 25%. Diffuse hypokinesis. There is akinesis of the   anteroseptal myocardium. There is akinesis of the   basal-midinferior myocardium. Indeterminate diastolic function. - Ventricular septum: Septal motion showed abnormal function and   dyssynergy. - Aortic valve: Mildly to moderately calcified annulus. Trileaflet. - Mitral valve: Mildly calcified annulus. Mildly thickened leaflets   . There was moderate regurgitation. - Left atrium: The atrium was severely dilated. - Right ventricle: Systolic function was mildly reduced. - Right atrium: The atrium was at the upper limits of normal in   size. Central venous pressure (est): 3 mm Hg. - Atrial septum: No defect or patent foramen ovale was identified. - Tricuspid valve: There was mild regurgitation. - Pulmonary arteries: PA peak pressure: 30 mm Hg (S). - Pericardium, extracardiac: There was no pericardial effusion.       ASSESSMENT:    1. Secondary cardiomyopathy ([place])   2. Chronic combined systolic and diastolic CHF, NYHA class 3 (HCC)   3. Atherosclerosis of native coronary artery of native heart without angina pectoris   4. Chronic atrial fibrillation (HCC)   5. Essential hypertension, benign      PLAN:  In order of problems listed above:  Nonischemic cardiomyopathy ejection fraction 25% recent cath 05/03/2017 patent RCA stent.  Chronic  combined systolic and diastolic CHF-CHF compensated and patient's blood pressure has been running low.  We will try to decrease Lasix to 40 mg twice daily and potassium 20 mEq twice daily.  2 g sodium diet instructions given.  May take extra Lasix and potassium if weight increases by 2 or 3 pounds overnight or 5 pounds in 1 week.  Follow-up with [place] in 2 to 3 weeks.  CAD status post RCA stent 2014 no angina now off aspirin with GI bleed  Chronic atrial fibrillation on Xarelto with failed cardioversion on amiodarone.  Plan was to admit for Tikosyn once amiodarone level came down enough.  Unfortunately she developed a GI bleed 06/12/2017 with hemoglobin down to 7 and guaiac positive  Stools.  Xarelto was held until 06/18/2017.  We will have to wait 4 weeks of uninterrupted Xarelto before admission for Tikosyn.  We will schedule her to see Roderic Palau in the A. fib clinic after July 11.  Will check amiodarone level and be met in 2 weeks.    Medication Adjustments/Labs and Tests Ordered: Current medicines are reviewed at length with the patient today.  Concerns regarding medicines are outlined above.  Medication changes, Labs and Tests ordered today are listed in the Patient Instructions below. Patient Instructions  Medication Instructions:  Your physician has recommended you make the following change in your medication:  Decrease Lasix to 40 mg Two Times Daily  Decrease Potassium to 20 mg Two Times Daily    Labwork: Your physician recommends that you return for lab work in: 2 Weeks.    Testing/Procedures: NONE   Follow-Up: Your physician recommends that you schedule a follow-up appointment in: July with Bernerd Pho, PA-C    Any Other Special Instructions Will Be Listed Below (If Applicable). You have been referred to A-Fib Clinic      If you need a refill on your cardiac medications before your next appointment, please call your pharmacy. Thank you for choosing [facility]!       Signed, Ermalinda Barrios, PA-C  06/17/2017 1:04 PM    [facility]  [address], [place], [place]  [address] Phone: [phone]; Fax: [phone]

## 2017-06-12 NOTE — ED Triage Notes (Signed)
Pt reports generalized weakness for months.  Reports saw GI Monday and was told her hemoglobin was 7.  Went back yesterday and stool tested positive for blood.  Pt says stool has been black intermittently.  Denies pain.  Reports history of colitis.

## 2017-06-12 NOTE — Telephone Encounter (Signed)
Awaiting amoidarone level to return to low enough to start tikosyn. Would go ahead and schedule endoscopy.

## 2017-06-12 NOTE — Progress Notes (Signed)
**Note De-Identified  Obfuscation** Per RT protocol patient was not educated on IS at this time.  RRT to continue to monitor

## 2017-06-12 NOTE — Telephone Encounter (Signed)
Please clarify if patient is okay to stop ASA & Xarelto 2 days prior to endoscopy.  See Camnitz note below.

## 2017-06-12 NOTE — ED Notes (Signed)
Blood not ready yet

## 2017-06-12 NOTE — ED Notes (Signed)
Pt stated she feels slight heaviness in chest. States it could be anxiety. No other abnormalities. All VSS. Pt alert and oriented x 4

## 2017-06-13 DIAGNOSIS — K921 Melena: Secondary | ICD-10-CM

## 2017-06-13 DIAGNOSIS — E440 Moderate protein-calorie malnutrition: Secondary | ICD-10-CM

## 2017-06-13 DIAGNOSIS — D179 Benign lipomatous neoplasm, unspecified: Secondary | ICD-10-CM

## 2017-06-13 DIAGNOSIS — K633 Ulcer of intestine: Secondary | ICD-10-CM

## 2017-06-13 DIAGNOSIS — I481 Persistent atrial fibrillation: Secondary | ICD-10-CM

## 2017-06-13 DIAGNOSIS — Z7901 Long term (current) use of anticoagulants: Secondary | ICD-10-CM

## 2017-06-13 DIAGNOSIS — I9589 Other hypotension: Secondary | ICD-10-CM

## 2017-06-13 DIAGNOSIS — I25119 Atherosclerotic heart disease of native coronary artery with unspecified angina pectoris: Secondary | ICD-10-CM

## 2017-06-13 DIAGNOSIS — D500 Iron deficiency anemia secondary to blood loss (chronic): Secondary | ICD-10-CM

## 2017-06-13 DIAGNOSIS — I429 Cardiomyopathy, unspecified: Secondary | ICD-10-CM

## 2017-06-13 DIAGNOSIS — D649 Anemia, unspecified: Secondary | ICD-10-CM

## 2017-06-13 LAB — URINE CULTURE: CULTURE: NO GROWTH

## 2017-06-13 LAB — COMPREHENSIVE METABOLIC PANEL
ALK PHOS: 41 U/L (ref 38–126)
ALT: 9 U/L — AB (ref 14–54)
AST: 14 U/L — AB (ref 15–41)
Albumin: 3 g/dL — ABNORMAL LOW (ref 3.5–5.0)
Anion gap: 7 (ref 5–15)
BILIRUBIN TOTAL: 2.5 mg/dL — AB (ref 0.3–1.2)
BUN: 21 mg/dL — AB (ref 6–20)
CALCIUM: 8.9 mg/dL (ref 8.9–10.3)
CO2: 25 mmol/L (ref 22–32)
CREATININE: 0.87 mg/dL (ref 0.44–1.00)
Chloride: 107 mmol/L (ref 101–111)
GFR, EST NON AFRICAN AMERICAN: 59 mL/min — AB (ref >60)
Glucose, Bld: 86 mg/dL (ref 65–99)
Potassium: 3.4 mmol/L — ABNORMAL LOW (ref 3.5–5.1)
Sodium: 139 mmol/L (ref 135–145)
TOTAL PROTEIN: 5.5 g/dL — AB (ref 6.5–8.1)

## 2017-06-13 LAB — CBC
HCT: 22.4 % — ABNORMAL LOW (ref 36.0–46.0)
Hemoglobin: 7.1 g/dL — ABNORMAL LOW (ref 12.0–15.0)
MCH: 22.8 pg — ABNORMAL LOW (ref 26.0–34.0)
MCHC: 31.7 g/dL (ref 30.0–36.0)
MCV: 72 fL — ABNORMAL LOW (ref 78.0–100.0)
Platelets: 412 K/uL — ABNORMAL HIGH (ref 150–400)
RBC: 3.11 MIL/uL — ABNORMAL LOW (ref 3.87–5.11)
RDW: 18.1 % — ABNORMAL HIGH (ref 11.5–15.5)
WBC: 6.6 K/uL (ref 4.0–10.5)

## 2017-06-13 LAB — PREPARE RBC (CROSSMATCH)

## 2017-06-13 MED ORDER — SODIUM CHLORIDE 0.9 % IV SOLN
Freq: Once | INTRAVENOUS | Status: AC
  Administered 2017-06-13: 19:00:00 via INTRAVENOUS

## 2017-06-13 MED ORDER — ATORVASTATIN CALCIUM 20 MG PO TABS
20.0000 mg | ORAL_TABLET | Freq: Every day | ORAL | Status: DC
  Administered 2017-06-13 – 2017-06-14 (×2): 20 mg via ORAL
  Filled 2017-06-13 (×2): qty 1

## 2017-06-13 MED ORDER — POTASSIUM CHLORIDE CRYS ER 20 MEQ PO TBCR: 20.0000 meq | EXTENDED_RELEASE_TABLET | Freq: Two times a day (BID) | ORAL | Status: DC

## 2017-06-13 MED ORDER — FUROSEMIDE 40 MG PO TABS
60.0000 mg | ORAL_TABLET | Freq: Two times a day (BID) | ORAL | Status: DC
  Administered 2017-06-13 – 2017-06-15 (×3): 60 mg via ORAL
  Filled 2017-06-13 (×4): qty 1

## 2017-06-13 MED ORDER — METOPROLOL SUCCINATE ER 50 MG PO TB24
50.0000 mg | ORAL_TABLET | Freq: Two times a day (BID) | ORAL | Status: DC
  Administered 2017-06-13 – 2017-06-14 (×2): 50 mg via ORAL
  Filled 2017-06-13 (×4): qty 1

## 2017-06-13 MED ORDER — FUROSEMIDE 10 MG/ML IJ SOLN
20.0000 mg | Freq: Once | INTRAMUSCULAR | Status: AC
  Administered 2017-06-13: 20 mg via INTRAVENOUS
  Filled 2017-06-13: qty 2

## 2017-06-13 MED ORDER — ACETAMINOPHEN 325 MG PO TABS
650.0000 mg | ORAL_TABLET | Freq: Once | ORAL | Status: AC
  Administered 2017-06-13: 650 mg via ORAL
  Filled 2017-06-13: qty 2

## 2017-06-13 MED ORDER — BOOST / RESOURCE BREEZE PO LIQD CUSTOM
1.0000 | Freq: Three times a day (TID) | ORAL | Status: DC
  Administered 2017-06-13 – 2017-06-15 (×2): 1 via ORAL

## 2017-06-13 MED ORDER — POTASSIUM CHLORIDE CRYS ER 20 MEQ PO TBCR
40.0000 meq | EXTENDED_RELEASE_TABLET | Freq: Two times a day (BID) | ORAL | Status: DC
Start: 2017-06-13 — End: 2017-06-15
  Administered 2017-06-13 – 2017-06-15 (×4): 40 meq via ORAL
  Filled 2017-06-13 (×4): qty 2

## 2017-06-13 MED ORDER — LISINOPRIL 5 MG PO TABS: 5.0000 mg | ORAL_TABLET | Freq: Every day | ORAL | Status: DC

## 2017-06-13 NOTE — Progress Notes (Addendum)
Initial Nutrition Assessment  DOCUMENTATION CODES:  Obesity unspecified, Non-severe (moderate) malnutrition in context of chronic illness  INTERVENTION:  Monitor PO intake to reach needs, especially once diet is advanced.  Boost Breeze po TID, each supplement provides 250 kcal and 9 grams of protein  D/C ensure for now  NUTRITION DIAGNOSIS:  Moderate Malnutrition related to poor appetite as evidenced by 15.2% x <6 months, mild fat depletion, mild muscle depletion.  GOAL:  Patient will meet greater than or equal to 90% of their needs  MONITOR:  PO intake, Labs, Supplement acceptance, Weight trends  REASON FOR ASSESSMENT:  Malnutrition Screening Tool    ASSESSMENT:  84 y/o female with a Hx of afib, bladder cancer, HTN, CHF, colitis, arthritis, coronary atheroclerosis, and HLD.  Admitted for fatigue and GI bleed.  MST score of 4.  Pt has had no appetite since Christmas 2018.  Pt speculates that this might be because of different medications her Doctors are having her try for her heart, but have yet to find the cause.  Her total intake on a typical day has been: B-scrambled egg/toast/applesauce, L-1/2 c. Yogurt or 1/2 sandwich, S-ear of corn.  Minimal snacking only when she needs to take her medication.  Pt says the sight of food makes her nauseated.  Pt was taking ensure up until about a month ago and only takes vitamins for her eyes.  It is difficult to determine exactly what intake was  Pt's UBW is 230 lbs.  CBW is 195 lbs, she has lost 35 lbs since mid-January, which is 15.29% BW over 4.5 months.  This is clinically significant for severe malnutrition.  A NFPE was performed and, despite weight loss, only a few signs of mild malnutrition were found.    Currently pt still does not have an appetite, but is eating a little because "she's know she needs to, bot because she wants to".  Last night she managed to eat angel cake, two bites of green beans, and 1 bite of chicken.  She is now on  a clear liquid diet for endoscopy procedure.  She said she is having some issues with constipation, but that she takes "9 pills" for it, more specifically for her colitis.    Interventions include monitoring intake, especially after diet advancement.  Encourage 90%+ PO intake.  Encourage use of Ensure Enlive po BID.  Labs reviewed: Potassium: 3.4 (3.5-51), BUN 21(6-20), total protein 5.5 (6.5-8.1) albumin 3 (3.5-5), AST 14 (15-41), ALT 9 (14-54), bilirubin 2.5 (0.3-1.2), GFR 59 (>60) Recent Labs  Lab 06/12/17 0938 06/13/17 0454  NA 136 139  K 3.9 3.4*  CL 102 107  CO2 24 25  BUN 25* 21*  CREATININE 0.95 0.87  CALCIUM 9.1 8.9  GLUCOSE 100* 86   Medications: Lasix, toprol-xl, potassium chloride, saline, pepcid  Past Medical History:  Diagnosis Date  . Arthritis   . Atrial flutter ([place])   . Cancer [facility])    bladder cancer  . Coronary atherosclerosis of native coronary artery    BMS RCA May 2014 - [place] stent  . Depression   . Essential hypertension, benign   . Hyperlipemia   . Macular degeneration   . Paroxysmal atrial fibrillation (HCC)   . Secondary cardiomyopathy (HCC)    LVEF 35% - improved to 45-50%  . Ulcerative colitis     NUTRITION - FOCUSED PHYSICAL EXAM:    Most Recent Value  Orbital Region  No depletion  Upper Arm Region  No depletion  Thoracic and  Lumbar Region  No depletion  Temple Region  Moderate depletion  Clavicle Bone Region  No depletion  Clavicle and Acromion Bone Region  No depletion  Scapular Bone Region  No depletion  Dorsal Hand  Mild depletion  Patellar Region  No depletion  Anterior Thigh Region  Mild depletion  Posterior Calf Region  Unable to assess [compressors on]  Edema (RD Assessment)  Mild  Skin  Reviewed  Nails  Reviewed     Diet Order:   Diet Order           Diet clear liquid Room service appropriate? Yes; Fluid consistency: Thin  Diet effective now         EDUCATION NEEDS:   No education needs have been identified  at this time  Skin:  Skin Assessment: Reviewed RN Assessment  Last BM:  6/4  Height:  Ht Readings from Last 1 Encounters:  06/12/17 5' 7"  (1.702 m)   Weight:  Wt Readings from Last 1 Encounters:  06/13/17 195 lb 12.3 oz (88.8 kg)   Ideal Body Weight:  61.2 kg  BMI:  Body mass index is 30.66 kg/m.   Adjusted Body Weight: 68.1 kg  Estimated Nutritional Needs:  Kcal:  1500-1700 kcal (22-25 kcal/kg adjusted BW) Protein:  75-85 g (1.1-1.25 g/kg adjusted BW or 20% kcal) Fluid:  1500-1700 mL (72m/kcal)

## 2017-06-13 NOTE — Telephone Encounter (Signed)
Xarelto should be held 48 hours prior to endoscopy.  Would go ahead and stop aspirin altogether.

## 2017-06-13 NOTE — Progress Notes (Signed)
PROGRESS NOTE    LAJOYCE [name]  QAS:[id number]  DOB: January 25, 1930  DOA: 06/12/2017 PCP: Rory Percy, MD   Brief Admission Hx: [name] is a 84 y.o. female with a history of ulcerative colitis paroxysmal atrial fibrillation, congestive heart failure, ulcerative colitis, macular degeneration hypertension, hyperlipidemia, depression, and bladder cancer. She takes Xarelto to manage CHF and Afib. She was sent to the ED by Dr. Olevia Perches office due to positive guaiac test and hemoglobin of 7.0 as of Monday. Patient denies pain, but does state that she has been feeling fatigued and has been having black stools off and on for past week. She states she has had no appetite and has lost 33 lbs since April  MDM/Assessment & Plan:   1. Symptomatic Anemia - Hemoglobin of 6.8 on admission and increased only to 7.0 after 1 unit PRBC. Awaiting for GI consult regarding plans. This is decrease from hemoglobin of 7.0 on Monday. Patient has been ordered 1 unit of PRBC to be transfused and Pepcid IV. Patient has been taken off of aspirin and Xarelto for now. GI has patient on clear liquid diet. Consult to cardiology for monitoring of coagulation therapy for Afib and CHF. OK with holding for now.  2. Atrial fib - Currently stable. Was on Xarelto. Anticoagulation stopped due to bleeding.  3. Chronic combined systolic and diastolic CHF - Currently stable. Being treated with Xarelto. Anticoagulation stopped due to bleeding. Cardiology consult pending. 4. Hypertension - Stable will continue home medication while in patient. 5. Hyperlipidemia - Stable, will continue home medication while in patient. 6. Melena - Patient had positive guaiac; GI consult pending.  DVT Prophylaxis: SCDs Code Status: FULL CODE  Family Communication: Husband, daughter at bedside.  Disposition Plan: Patient discharge pending hemoglobin repletion and consult with GI and Cardiology.    Consultants:  GI  Cardiology  Subjective: Pt says she feels a lot better after the 1 unit of PRBC and is much less SOB, no black stool, no melena or maroon colored stool.   Objective: Vitals:   06/12/17 2053 06/12/17 2157 06/12/17 2210 06/13/17 0640  BP:  (!) 87/52 90/62 103/69  Pulse:  73  70  Resp:      Temp:  97.7 F (36.5 C)  98.3 F (36.8 C)  TempSrc:  Oral  Oral  SpO2: 98% 98%  96%  Weight:    88.8 kg (195 lb 12.3 oz)  Height:       No intake or output data in the 24 hours ending 06/13/17 1617 Filed Weights   06/12/17 0924 06/13/17 0640  Weight: 87.8 kg (193 lb 9.6 oz) 88.8 kg (195 lb 12.3 oz)   REVIEW OF SYSTEMS  As per history otherwise all reviewed and reported negative  Exam:  General exam: awake, alert, NAD, cooperative.  Respiratory system: Clear. No increased work of breathing. Cardiovascular system: S1 & S2 heard, RRR. No JVD, murmurs, gallops, clicks or pedal edema. Gastrointestinal system: Abdomen is nondistended, soft and nontender. Normal bowel sounds heard. Central nervous system: Alert and oriented. No focal neurological deficits. Extremities: no CCE.  Data Reviewed: Basic Metabolic Panel: Recent Labs  Lab 06/12/17 0938 06/13/17 0454  NA 136 139  K 3.9 3.4*  CL 102 107  CO2 24 25  GLUCOSE 100* 86  BUN 25* 21*  CREATININE 0.95 0.87  CALCIUM 9.1 8.9   Liver Function Tests: Recent Labs  Lab 06/12/17 0938 06/13/17 0454  AST 19 14*  ALT 8* 9*  ALKPHOS 48  41  BILITOT 1.2 2.5*  PROT 6.5 5.5*  ALBUMIN 3.5 3.0*   No results for input(s): LIPASE, AMYLASE in the last 168 hours. No results for input(s): AMMONIA in the last 168 hours. CBC: Recent Labs  Lab 06/10/17 1018 06/12/17 0938 06/13/17 0454  WBC 7.3 6.2 6.6  NEUTROABS 4,701 3.8  --   HGB 7.0* 6.8* 7.1*  HCT 23.3* 22.2* 22.4*  MCV 74.0* 71.6* 72.0*  PLT 485* 465* 412*   Cardiac Enzymes: Recent Labs  Lab 06/12/17 0938 06/12/17 1508 06/12/17 1814  TROPONINI <0.03  <0.03 <0.03   CBG (last 3)  No results for input(s): GLUCAP in the last 72 hours. Recent Results (from the past 240 hour(s))  Urine culture     Status: None   Collection Time: 06/12/17  9:38 AM  Result Value Ref Range Status   Specimen Description   Final    URINE, CLEAN CATCH Performed at [facility], [address], [place], [place] [address]    Special Requests   Final    NONE Performed at [facility], [address], [place], [place] [address]    Culture   Final    NO GROWTH Performed at [facility], [place] [address] [place], [place] [address]    Report Status 06/13/2017 FINAL  Final     Studies: No results found.   Scheduled Meds: . atorvastatin  20 mg Oral QHS  . feeding supplement  1 Container Oral TID BM  . furosemide  60 mg Oral BID  . metoprolol succinate  50 mg Oral BID WC  . potassium chloride  40 mEq Oral BID  . sodium chloride flush  3 mL Intravenous Q12H   Continuous Infusions: . sodium chloride    . famotidine (PEPCID) IV Stopped (06/13/17 1022)    Principal Problem:   Symptomatic anemia Active Problems:   Mixed hyperlipidemia   Essential hypertension, benign   A-fib (HCC)   Chronic combined systolic and diastolic CHF, NYHA class 3 (HCC)   Melena   Malnutrition of moderate degree   Time spent:   Irwin Brakeman, MD, FAAFP [facility] Pager [phone]  If 7PM-7AM, please contact night-coverage [email or web address] Password [email or web address] 06/13/2017, 4:17 PM    LOS: 1 day

## 2017-06-13 NOTE — Consult Note (Signed)
Referring Provider: No ref. provider found Primary Care Physician:  Rory Percy, MD Primary Gastroenterologist:  Dr. Laural Golden  Reason for Consultation:    Anemia and melena.  HPI:   Patient is 84 year old Caucasian female who has a history of ulcerative colitis diagnosed about 12 years ago and she has remained on remission on balsalazide.  Last colonoscopy was in June 2017 revealing endoscopic remission.  She had 5 small polyps and these were tubular adenomas.  When seen in April 2018 she was felt to be in remission.  Patient was seen in our office on 06/10/2017 at request of Dr. Rory Percy for anemia and history of melena.  Patient reports anorexia and gradual weight loss.  She has lost close to 30 pounds in the last 3 months.  She says appetite has however improved over the last few days.  She has not experienced nausea vomiting or abdominal pain.  She has noted black stools intermittently.  He denies rectal bleeding or diarrhea.  She has not taken Pepto-Bismol or p.o. iron.  She was noted to have hemoglobin of 12.1 g back in February 2019 and her hemoglobin was 9.9 about 6 weeks ago.  She had blood drawn following her office visit and hemoglobin was 7.0 g.  Therefore patient was advised to go to emergency room.  She states she has had no energy and has been experiencing dyspnea on walking few steps.  She has not experienced chest pain.  There is no history of peptic ulcer disease.  She has been on low-dose aspirin along with rivaroxaban as of atrial fibrillation and CAD. Patient's hemoglobin on admission was 6.8 g.  She has received 1 unit of PRBCs and her hemoglobin is up to 7.1 g.  He definitely feels better. She and her husband live in [place]. She does not smoke cigarettes or drink alcohol.   Past Medical History:  Diagnosis Date  . Arthritis   . Atrial flutter ([place])   . Cancer [facility])    bladder cancer  . Coronary atherosclerosis of native coronary artery    BMS RCA May  2014 - [place] stent  . Depression   . Essential hypertension, benign   . Hyperlipemia   . Macular degeneration   . Paroxysmal atrial fibrillation (HCC)   . Secondary cardiomyopathy (HCC)    LVEF 35% - improved to 45-50%  . Ulcerative colitis; in remission at the time of last colonoscopy in June 2017        History of colonic adenomas.  Past Surgical History:  Procedure Laterality Date  . ABDOMINAL HYSTERECTOMY    . APPENDECTOMY    . Bilateral knee replacements      2007, 2008  . BLADDER SURGERY    . CARDIOVERSION N/A 02/04/2017   Procedure: CARDIOVERSION;  Surgeon: Arnoldo Lenis, MD;  [facility];  Service: Endoscopy;  Laterality: N/A;  . CARDIOVERSION N/A 02/26/2017   Procedure: CARDIOVERSION;  Surgeon: Satira Sark, MD;  Location: [facility] ORS;  Service: Cardiovascular;  Laterality: N/A;  . COLONOSCOPY N/A 06/15/2015   Procedure: COLONOSCOPY;  Surgeon: Rogene Houston, MD;  Location: [facility];  Service: Endoscopy;  Laterality: N/A;  210  . CYSTOSCOPY W/ RETROGRADES Bilateral 05/14/2016   Procedure: CYSTOSCOPY WITH BILATERAL RETROGRADE PYELOGRAM;  Surgeon: Raynelle Bring, MD;  Location: [facility] ORS;  Service: Urology;  Laterality: Bilateral;  GENERAL ANESTHESIA WITH PARALYSIS  . LEFT HEART CATHETERIZATION WITH CORONARY ANGIOGRAM N/A 10/30/2013   Procedure: LEFT HEART CATHETERIZATION WITH CORONARY  ANGIOGRAM;  Surgeon: Burnell Blanks, MD;  Location: [facility] CATH LAB;  Service: Cardiovascular;  Laterality: N/A;  . RIGHT/LEFT HEART CATH AND CORONARY ANGIOGRAPHY N/A 05/03/2017   Procedure: RIGHT/LEFT HEART CATH AND CORONARY ANGIOGRAPHY;  Surgeon: Leonie Man, MD;  Location: [facility] CV LAB;  Service: Cardiovascular;  Laterality: N/A;  . TEE WITHOUT CARDIOVERSION N/A 02/04/2017   Procedure: TRANSESOPHAGEAL ECHOCARDIOGRAM (TEE) WITH PROPOL;  Surgeon: Arnoldo Lenis, MD;  [facility];  Service: Endoscopy;  Laterality: N/A;  . TONSILLECTOMY    . TOTAL  KNEE ARTHROPLASTY    . TRANSURETHRAL RESECTION OF BLADDER TUMOR N/A 05/14/2016   Procedure: TRANSURETHRAL RESECTION OF BLADDER TUMOR (TURBT);  Surgeon: Raynelle Bring, MD;  Location: [facility];  Service: Urology;  Laterality: N/A;  GENERAL ANESTHESIA WITH PARALYSIS  . YAG LASER APPLICATION Bilateral 16/01/958   Procedure: YAG LASER APPLICATION;  Surgeon: Williams Che, MD;  Location: [facility] OR[facility];  Service: Ophthalmology;  Laterality: Bilateral;    Prior to Admission medications   Medication Sig Start Date End Date Taking? Authorizing Provider  acetaminophen (TYLENOL) 500 MG tablet Take 1,000 mg by mouth every 6 (six) hours as needed.   Yes [provider]  ASPIRIN ADULT LOW STRENGTH 81 MG EC tablet Take 81 mg by mouth daily. 01/25/17  Yes [provider]  atorvastatin (LIPITOR) 20 MG tablet TAKE ONE TABLET BY MOUTH AT BEDTIME 02/14/17  Yes Satira Sark, MD  balsalazide (COLAZAL) 750 MG capsule TAKE THREE CAPSULES BY MOUTH THREE TIMES DAILY (REPLACING DELZICOL) 12/09/16  Yes Rehman, Mechele Dawley, MD  furosemide (LASIX) 20 MG tablet TAKE THREE TABLETS BY MOUTH TWICE DAILY 04/04/17  Yes Satira Sark, MD  ketotifen [name] ALLERGY) 0.025 % ophthalmic solution Place 2 drops into both eyes 3 (three) times daily as needed (for dry eyes).    Yes [provider]  lisinopril (PRINIVIL,ZESTRIL) 5 MG tablet Take 1 tablet (5 mg total) by mouth daily. 05/21/17 08/19/17 Yes Camnitz, Will Hassell Done, MD  metoprolol succinate (TOPROL-XL) 50 MG 24 hr tablet Take 1 tablet (50 mg total) by mouth 2 (two) times daily with a meal. Take with or immediately following a meal. 04/24/17  Yes Satira Sark, MD  Multiple Vitamins-Minerals (ICAPS AREDS 2 PO) Take 2 tablets by mouth daily at 6 PM.    Yes [provider]  potassium chloride (K-DUR,KLOR-CON) 10 MEQ tablet TAKE THREE TABLETS BY MOUTH THREE TIMES DAILY 04/04/17  Yes Satira Sark, MD  rivaroxaban (XARELTO) 20 MG TABS tablet Take  1 tablet (20 mg total) by mouth daily with supper. 05/23/17  Yes Camnitz, Ocie Doyne, MD  nitroGLYCERIN (NITROSTAT) 0.4 MG SL tablet Place 1 tablet (0.4 mg total) under the tongue every 5 (five) minutes as needed for chest pain. 02/06/17 05/07/17  Satira Sark, MD    Current Facility-Administered Medications  Medication Dose Route Frequency Provider Last Rate Last Dose  . 0.9 %  sodium chloride infusion  250 mL Intravenous PRN Johnson, Clanford L, MD      . acetaminophen (TYLENOL) tablet 650 mg  650 mg Oral Q6H PRN Johnson, Clanford L, MD       Or  . acetaminophen (TYLENOL) suppository 650 mg  650 mg Rectal Q6H PRN Johnson, Clanford L, MD      . atorvastatin (LIPITOR) tablet 20 mg  20 mg Oral QHS Strader, [place] M, PA-C      . famotidine (PEPCID) IVPB 20 mg premix  20 mg Intravenous Q12H  Murlean Iba, MD   Stopped at 06/13/17 1022  . feeding supplement (BOOST / RESOURCE BREEZE) liquid 1 Container  1 Container Oral TID BM Johnson, Clanford L, MD      . furosemide (LASIX) tablet 60 mg  60 mg Oral BID Bernerd Pho M, PA-C   60 mg at 06/13/17 1003  . metoprolol succinate (TOPROL-XL) 24 hr tablet 50 mg  50 mg Oral BID WC Strader, [place] M, PA-C   50 mg at 06/13/17 1002  . potassium chloride SA (K-DUR,KLOR-CON) CR tablet 40 mEq  40 mEq Oral BID Bernerd Pho M, PA-C   40 mEq at 06/13/17 1002  . sodium chloride flush (NS) 0.9 % injection 3 mL  3 mL Intravenous Q12H Johnson, Clanford L, MD   3 mL at 06/13/17 1004  . sodium chloride flush (NS) 0.9 % injection 3 mL  3 mL Intravenous PRN Wynetta Emery, Clanford L, MD        Allergies as of 06/12/2017 - Review Complete 06/12/2017  Allergen Reaction Noted  . Sinus & allergy [chlorpheniramine-phenylephrine] Shortness Of Breath 11/21/2010  . Demerol Rash 04/04/2010  . Penicillins Rash and Other (See Comments)     Family History  Problem Relation Age of Onset  . CAD Father     Social History   Socioeconomic History  . Marital  status: Married    Spouse name: Not on file  . Number of children: Not on file  . Years of education: Not on file  . Highest education level: Not on file  Occupational History  . Not on file  Social Needs  . Financial resource strain: Not on file  . Food insecurity:    Worry: Not on file    Inability: Not on file  . Transportation needs:    Medical: Not on file    Non-medical: Not on file  Tobacco Use  . Smoking status: Never Smoker  . Smokeless tobacco: Never Used  Substance and Sexual Activity  . Alcohol use: No    Alcohol/week: 0.0 oz  . Drug use: No  . Sexual activity: Not Currently  Lifestyle  . Physical activity:    Days per week: Not on file    Minutes per session: Not on file  . Stress: Not on file  Relationships  . Social connections:    Talks on phone: Not on file    Gets together: Not on file    Attends religious service: Not on file    Active member of club or organization: Not on file    Attends meetings of clubs or organizations: Not on file    Relationship status: Not on file  . Intimate partner violence:    Fear of current or ex partner: Not on file    Emotionally abused: Not on file    Physically abused: Not on file    Forced sexual activity: Not on file  Other Topics Concern  . Not on file  Social History Narrative  . Not on file    Review of Systems: See HPI, otherwise normal ROS  Physical Exam: Temp:  [97.7 F (36.5 C)-98.3 F (36.8 C)] 98.3 F (36.8 C) (06/06 0640) Pulse Rate:  [70-73] 70 (06/06 0640) BP: (87-103)/(52-69) 103/69 (06/06 0640) SpO2:  [96 %-98 %] 96 % (06/06 0640) Weight:  [195 lb 12.3 oz (88.8 kg)] 195 lb 12.3 oz (88.8 kg) (06/06 0640) Last BM Date: 06/11/17  Patient is alert and in no acute distress. She is quite pale. Conjunctiva is  also pale and sclerae nonicteric. Oropharyngeal mucosa is unremarkable. No neck masses or thyromegaly noted.  JVD is not elevated. Exam with irregular rhythm normal S1 and S2.  Faint  systolic ejection murmur noted at upper left sternal border. Lungs are clear to auscultation. Abdomen is full.  Bowel sounds are normal.  No bruit noted.  On palpation is soft and nontender with organomegaly or masses. She has nonpitting pretibial edema.  Lab Results: Recent Labs    06/12/17 0938 06/13/17 0454  WBC 6.2 6.6  HGB 6.8* 7.1*  HCT 22.2* 22.4*  PLT 465* 412*   BMET Recent Labs    06/12/17 0938 06/13/17 0454  NA 136 139  K 3.9 3.4*  CL 102 107  CO2 24 25  GLUCOSE 100* 86  BUN 25* 21*  CREATININE 0.95 0.87  CALCIUM 9.1 8.9   LFT Recent Labs    06/13/17 0454  PROT 5.5*  ALBUMIN 3.0*  AST 14*  ALT 9*  ALKPHOS 41  BILITOT 2.5*    Assessment;  Patient is 84 year old Caucasian female who presents with iron deficiency anemia and history of intermittent melena for about a month.  Patient has a history of atrial fibrillation as well as coronary artery disease and has been on low-dose aspirin and Rivroxaban which are both on hold.  She does give a history of anorexia and weight loss which started about 6 months ago.  He has a history of ulcerative colitis which would appear to be in remission based on her symptoms.  She was in remission at the time of last colonoscopy in June 2017 when she had 5 small tubular adenomas removed. Patient's hemoglobin was normal 4 months ago.  It appears she has been losing blood for at least last 2 months.  Her hemoglobin has only come up to 7.1 g with 1 unit of PRBCs.  Therefore she would benefit from another unit of PRBCs.  Since there is concern for inducing CHF diuretic could be administered prior to transfusion. Differential diagnoses include peptic ulcer disease AV malformation as well as neoplasm. It remains to be seen if weight loss is the result of cardiomyopathy or sources in the GI tract.  Recommendations;  Patient will benefit from another unit of PRBCs.  Discussed with Dr. Wynetta Emery and he agrees. Hemoccult  x1. Esophagogastroduodenoscopy under monitored anesthesia care in a.m. If esophagogastroduodenoscopy is unremarkable will proceed with small bowel given capsule study. Patient is agreeable to proceeding with esophagogastroduodenoscopy followed by given capsule study if indicated.  If bleeding sources identified on these studies she may not need colonoscopy.   LOS: 1 day   Najeeb Rehman  06/13/2017, 4:45 PM

## 2017-06-13 NOTE — Telephone Encounter (Signed)
Left message to return call 

## 2017-06-13 NOTE — Progress Notes (Signed)
BP  95/56   Pulse  66.  Dr Wynetta Emery ordered to hold metoprolol and po lasix

## 2017-06-13 NOTE — Consult Note (Addendum)
Cardiology Consult    Patient ID: BURGANDY HACKWORTH; [id number]; [birth year]   Admit date: 06/12/2017 Date of Consult: 06/13/2017  Primary Care Provider: Rory Percy, MD Primary Cardiologist: Rozann Lesches, MD  Primary Electrophysiologist: Dr. Reggy Eye  Patient Profile    [name] is a 84 y.o. female with past medical history of CAD (s/p BMS to RCA in 2014, patent stent by cath in 04/2017), persistent atrial fibrillation, tachycardia-mediated cardiomyopathy (EF 25% by echo in 04/2017), HTN, and HLD who is being seen today for the evaluation of atrial fibrillation/anticoagulation needs at the request of Dr. Wynetta Emery.   History of Present Illness    Ms. Deguzman has been followed closely by Dr. Domenic Polite over the past several months in regards to her atrial fibrillation and worsening cardiomyopathy. She underwent a repeat cardiac catheterization in 04/2017 which showed minimal CAD and a patent RCA stent. She was referred to Dr. Curt Bears for further medical management of her atrial fibrillation and in the setting of her age and chronic medical conditions, she was not felt to be a good candidate for ablation. Options were reviewed and admission for tikosyn loading was recommended. This was initially to occur in 05/2017 but her amiodarone level was higher than anticipated, therefore it was recommended to recheck this in 2 to 4 weeks and consider admission for Tikosyn initiation at that time.  In the interim, she has reached out to the office as she is scheduled for an endoscopy and her anticoagulation needs to be held for the procedure.  Therefore, it was recommended she proceed with endoscopy and then wait several weeks prior to Tikosyn initiation (would need to be on uninterrupted Xarelto for 3 weeks prior).   Labs were checked by her PCP yesterday and she was found to have a hemoglobin of 7 and stool guaiac was positive, therefore she was advised to go to the ED for further  evaluation. Initial labs showed WBC 6.2, Hgb 6.8, platelets 465, Na+ 136, K+ 3.9, creatinine 0.95  Initial and cyclic troponin values have remained negative. EKG shows atrial fibrillation, HR 79, with nonspecific IVCD (similar to prior tracings).   She retains received 2 units PRBCs upon admission and repeat Hgb this morning is at 7.1. GI has been consulted for further evaluation.  In talking with the patient and her daughter today, she reports having worsening dyspnea on exertion and fatigue over the past several weeks.Denies any specific orthopnea, PND, lower extremity edema, or chest discomfort. Does experience occasional palpitations. Reports weight has overall been stable on her home scales.   Past Medical History:  Diagnosis Date  . Arthritis   . Atrial flutter ([place])   . Cancer [facility])    bladder cancer  . Coronary atherosclerosis of native coronary artery    BMS RCA May 2014 - [place] stent  . Depression   . Essential hypertension, benign   . Hyperlipemia   . Macular degeneration   . Paroxysmal atrial fibrillation (HCC)   . Secondary cardiomyopathy (HCC)    LVEF 35% - improved to 45-50%  . Ulcerative colitis     Past Surgical History:  Procedure Laterality Date  . ABDOMINAL HYSTERECTOMY    . APPENDECTOMY    . Bilateral knee replacements      2007, 2008  . BLADDER SURGERY    . CARDIOVERSION N/A 02/04/2017   Procedure: CARDIOVERSION;  Surgeon: Arnoldo Lenis, MD;  Location[facility];  Service: Endoscopy;  Laterality: N/A;  . CARDIOVERSION N/A 02/26/2017  Procedure: CARDIOVERSION;  Surgeon: Satira Sark, MD;  Location: [facility] ORS;  Service: Cardiovascular;  Laterality: N/A;  . COLONOSCOPY N/A 06/15/2015   Procedure: COLONOSCOPY;  Surgeon: Rogene Houston, MD;  Location: [facility];  Service: Endoscopy;  Laterality: N/A;  210  . CYSTOSCOPY W/ RETROGRADES Bilateral 05/14/2016   Procedure: CYSTOSCOPY WITH BILATERAL RETROGRADE PYELOGRAM;  Surgeon: Raynelle Bring, MD;   Location: [facility] ORS;  Service: Urology;  Laterality: Bilateral;  GENERAL ANESTHESIA WITH PARALYSIS  . LEFT HEART CATHETERIZATION WITH CORONARY ANGIOGRAM N/A 10/30/2013   Procedure: LEFT HEART CATHETERIZATION WITH CORONARY ANGIOGRAM;  Surgeon: Burnell Blanks, MD;  Location: [facility] CATH LAB;  Service: Cardiovascular;  Laterality: N/A;  . RIGHT/LEFT HEART CATH AND CORONARY ANGIOGRAPHY N/A 05/03/2017   Procedure: RIGHT/LEFT HEART CATH AND CORONARY ANGIOGRAPHY;  Surgeon: Leonie Man, MD;  Location: [place] CV LAB;  Service: Cardiovascular;  Laterality: N/A;  . TEE WITHOUT CARDIOVERSION N/A 02/04/2017   Procedure: TRANSESOPHAGEAL ECHOCARDIOGRAM (TEE) WITH PROPOL;  Surgeon: Arnoldo Lenis, MD;  [facility];  Service: Endoscopy;  Laterality: N/A;  . TONSILLECTOMY    . TOTAL KNEE ARTHROPLASTY    . TRANSURETHRAL RESECTION OF BLADDER TUMOR N/A 05/14/2016   Procedure: TRANSURETHRAL RESECTION OF BLADDER TUMOR (TURBT);  Surgeon: Raynelle Bring, MD;  Location: [facility];  Service: Urology;  Laterality: N/A;  GENERAL ANESTHESIA WITH PARALYSIS  . YAG LASER APPLICATION Bilateral 62/06/9483   Procedure: YAG LASER APPLICATION;  Surgeon: Williams Che, MD;  Location: [facility] ORS;  Service: Ophthalmology;  Laterality: Bilateral;     Home Medications:  Prior to Admission medications   Medication Sig Start Date End Date Taking? Authorizing Provider  acetaminophen (TYLENOL) 500 MG tablet Take 1,000 mg by mouth every 6 (six) hours as needed.   Yes [provider]  ASPIRIN ADULT LOW STRENGTH 81 MG EC tablet Take 81 mg by mouth daily. 01/25/17  Yes [provider]  atorvastatin (LIPITOR) 20 MG tablet TAKE ONE TABLET BY MOUTH AT BEDTIME 02/14/17  Yes Satira Sark, MD  balsalazide (COLAZAL) 750 MG capsule TAKE THREE CAPSULES BY MOUTH THREE TIMES DAILY (REPLACING DELZICOL) 12/09/16  Yes Rehman, Mechele Dawley, MD  furosemide (LASIX) 20 MG tablet TAKE THREE TABLETS BY MOUTH TWICE DAILY 04/04/17   Yes Satira Sark, MD  ketotifen [facility] ALLERGY) 0.025 % ophthalmic solution Place 2 drops into both eyes 3 (three) times daily as needed (for dry eyes).    Yes [provider]  lisinopril (PRINIVIL,ZESTRIL) 5 MG tablet Take 1 tablet (5 mg total) by mouth daily. 05/21/17 08/19/17 Yes Camnitz, Will Hassell Done, MD  metoprolol succinate (TOPROL-XL) 50 MG 24 hr tablet Take 1 tablet (50 mg total) by mouth 2 (two) times daily with a meal. Take with or immediately following a meal. 04/24/17  Yes Satira Sark, MD  Multiple Vitamins-Minerals (ICAPS AREDS 2 PO) Take 2 tablets by mouth daily at 6 PM.    Yes [provider]  potassium chloride (K-DUR,KLOR-CON) 10 MEQ tablet TAKE THREE TABLETS BY MOUTH THREE TIMES DAILY 04/04/17  Yes Satira Sark, MD  rivaroxaban (XARELTO) 20 MG TABS tablet Take 1 tablet (20 mg total) by mouth daily with supper. 05/23/17  Yes Camnitz, Ocie Doyne, MD  nitroGLYCERIN (NITROSTAT) 0.4 MG SL tablet Place 1 tablet (0.4 mg total) under the tongue every 5 (five) minutes as needed for chest pain. 02/06/17 05/07/17  Satira Sark, MD    Inpatient Medications: Scheduled Meds: . feeding supplement (ENSURE ENLIVE)  237 mL Oral BID BM  . sodium chloride flush  3 mL Intravenous Q12H   Continuous Infusions: . sodium chloride    . famotidine (PEPCID) IV Stopped (06/12/17 2234)   PRN Meds: sodium chloride, acetaminophen **OR** acetaminophen, sodium chloride flush  Allergies:    Allergies  Allergen Reactions  . Sinus & Allergy [Chlorpheniramine-Phenylephrine] Shortness Of Breath  . Demerol Rash  . Penicillins Rash and Other (See Comments)    REACTION: rash, years ago Has patient had a PCN reaction causing immediate rash, facial/tongue/throat swelling, SOB or lightheadedness with hypotension: Yes Has patient had a PCN reaction causing severe rash involving mucus membranes or skin necrosis: No Has patient had a PCN reaction that required  hospitalization No Has patient had a PCN reaction occurring within the last 10 years: No If all of the above answers are "NO", then may proceed with Cephalosporin use.     Social History:   Social History   Socioeconomic History  . Marital status: Married    Spouse name: Not on file  . Number of children: Not on file  . Years of education: Not on file  . Highest education level: Not on file  Occupational History  . Not on file  Social Needs  . Financial resource strain: Not on file  . Food insecurity:    Worry: Not on file    Inability: Not on file  . Transportation needs:    Medical: Not on file    Non-medical: Not on file  Tobacco Use  . Smoking status: Never Smoker  . Smokeless tobacco: Never Used  Substance and Sexual Activity  . Alcohol use: No    Alcohol/week: 0.0 oz  . Drug use: No  . Sexual activity: Not Currently  Lifestyle  . Physical activity:    Days per week: Not on file    Minutes per session: Not on file  . Stress: Not on file  Relationships  . Social connections:    Talks on phone: Not on file    Gets together: Not on file    Attends religious service: Not on file    Active member of club or organization: Not on file    Attends meetings of clubs or organizations: Not on file    Relationship status: Not on file  . Intimate partner violence:    Fear of current or ex partner: Not on file    Emotionally abused: Not on file    Physically abused: Not on file    Forced sexual activity: Not on file  Other Topics Concern  . Not on file  Social History Narrative  . Not on file     Family History:    Family History  Problem Relation Age of Onset  . CAD Father       Review of Systems    General:  No chills, fever, night sweats or weight changes.  Cardiovascular:  No chest pain, edema, orthopnea, palpitations, paroxysmal nocturnal dyspnea. Positive for dyspnea on exertion and fatigue.  Dermatological: No rash, lesions/masses Respiratory: No  cough, dyspnea Urologic: No hematuria, dysuria Abdominal:   No nausea, vomiting, diarrhea, bright red blood per rectum, or hematemesis. Positive for melena.  Neurologic:  No visual changes, wkns, changes in mental status. All other systems reviewed and are otherwise negative except as noted above.  Physical Exam/Data    Vitals:   06/12/17 2053 06/12/17 2157 06/12/17 2210 06/13/17 0640  BP:  (!) 87/52 90/62 103/69  Pulse:  73  70  Resp:      Temp:  97.7 F (36.5 C)  98.3 F (36.8 C)  TempSrc:  Oral  Oral  SpO2: 98% 98%  96%  Weight:    195 lb 12.3 oz (88.8 kg)  Height:        Intake/Output Summary (Last 24 hours) at 06/13/2017 0744 Last data filed at 06/12/2017 1317 Gross per 24 hour  Intake 50 ml  Output -  Net 50 ml   Filed Weights   06/12/17 0924 06/13/17 0640  Weight: 193 lb 9.6 oz (87.8 kg) 195 lb 12.3 oz (88.8 kg)   Body mass index is 30.66 kg/m.   General: Pleasant, elderly Caucasian female appearing in NAD Psych: Normal affect. Neuro: Alert and oriented X 3. Moves all extremities spontaneously. HEENT: Normal  Neck: Supple without bruits or JVD. Lungs:  Resp regular and unlabored, CTA. Heart: Irregularly irregular. no s3, s4, or murmurs. Abdomen: Soft, non-tender, non-distended, BS + x 4.  Extremities: No clubbing, cyanosis or edema. DP/PT/Radials 2+ and equal bilaterally.   EKG:  The EKG was personally reviewed and demonstrates: Atrial fibrillation, HR 79, with nonspecific IVCD (similar to prior tracings).    Labs/Studies     Relevant CV Studies:  Cardiac Catheterization: 05/03/2017  Hemodynamic findings consistent with mild secondary pulmonary hypertension.  Patient has severe nonischemic cardiomyopathy  Moderate to Severely reduced CO/CI  LV end diastolic pressure is moderately elevated.  Mid RCA stent widely patent  There is mild (2+) mitral regurgitation.   Angiographically minimal CAD Moderate to Severely reduced CO/CI with severely reduced  EF on Echo  The patient has severe nonischemic cardiomyopathy with reduced EF and reduced output.  Will need aggressive heart failure management in the outpatient setting by Dr. Domenic Polite.  Venous sheath removed in the Cath Lab, Zephyr band will be removed in the post procedure unit.  She will be discharged home after bed rest.  Echocardiogram: 04/2017 Study Conclusions  - Left ventricle: The cavity size was mildly to moderately dilated.   Wall thickness was normal. The estimated ejection fraction was   approximately 25%. Diffuse hypokinesis. There is akinesis of the   anteroseptal myocardium. There is akinesis of the   basal-midinferior myocardium. Indeterminate diastolic function. - Ventricular septum: Septal motion showed abnormal function and   dyssynergy. - Aortic valve: Mildly to moderately calcified annulus. Trileaflet. - Mitral valve: Mildly calcified annulus. Mildly thickened leaflets   . There was moderate regurgitation. - Left atrium: The atrium was severely dilated. - Right ventricle: Systolic function was mildly reduced. - Right atrium: The atrium was at the upper limits of normal in   size. Central venous pressure (est): 3 mm Hg. - Atrial septum: No defect or patent foramen ovale was identified. - Tricuspid valve: There was mild regurgitation. - Pulmonary arteries: PA peak pressure: 30 mm Hg (S). - Pericardium, extracardiac: There was no pericardial effusion.   Laboratory Data:  Chemistry Recent Labs  Lab 06/12/17 0938 06/13/17 0454  NA 136 139  K 3.9 3.4*  CL 102 107  CO2 24 25  GLUCOSE 100* 86  BUN 25* 21*  CREATININE 0.95 0.87  CALCIUM 9.1 8.9  GFRNONAA 53* 59*  GFRAA >60 >60  ANIONGAP 10 7    Recent Labs  Lab 06/12/17 0938 06/13/17 0454  PROT 6.5 5.5*  ALBUMIN 3.5 3.0*  AST 19 14*  ALT 8* 9*  ALKPHOS 48 41  BILITOT 1.2 2.5*   Hematology Recent Labs  Lab 06/10/17 1018 06/12/17 4967 06/13/17 0454  WBC 7.3 6.2 6.6  RBC 3.15* 3.10*  3.11*  HGB 7.0* 6.8* 7.1*  HCT 23.3* 22.2* 22.4*  MCV 74.0* 71.6* 72.0*  MCH 22.2* 21.9* 22.8*  MCHC 30.0* 30.6 31.7  RDW 17.0* 17.9* 18.1*  PLT 485* 465* 412*   Cardiac Enzymes Recent Labs  Lab 06/12/17 0938 06/12/17 1508 06/12/17 1814  TROPONINI <0.03 <0.03 <0.03   No results for input(s): TROPIPOC in the last 168 hours.  BNPNo results for input(s): BNP, PROBNP in the last 168 hours.  DDimer No results for input(s): DDIMER in the last 168 hours.  Radiology/Studies:  No results found.   Assessment & Plan    1. Symptomatic Anemia - Patient presented to the ED after being found to have a hemoglobin of 7 and positive stool guaiac. She has experienced dyspnea on exertion over the past several months which she thought was secondary to her atrial fibrillation but has experienced worsening dyspnea and fatigue over the past week. - She has received 2 units pRBC's and hemoglobin is at 7.1 this AM. GI has been consulted for further evaluation. Agree with holding Xarelto at this time.   2. Persistent Atrial Fibrillation - The patient has a known history of atrial fibrillation and has previously failed amiodarone therapy. She had been evaluated by Dr. Curt Bears with EP with plans for Tikosyn admission over the next several weeks once her amiodarone level had declined. - Given her current anemia and the need to hold anticoagulation, Tikosyn admission would need to be deferred for at least 3 weeks after Xarelto has been resumed. This was reviewed with the patient and her daughter in detail.  - Heart rate remains well controlled in the 70's to 80's by review of telemetry. Will restart PTA Toprol-XL 85m BID.   3. Secondary Cardiomyopathy - The patient has a known reduced EF of 25% by echocardiogram in 04/2017 with repeat catheterization at that time showing minimal CAD. - Reports weight has overall been stable at home and she appears euvolemic by examination today. - would continue PTA PO Lasix  656mBID along with K+ supplementation. Restart Toprol-XL. Hold Lisinopril given soft BP.   4. CAD - s/p BMS to RCA in 2014 with patent stent by cath in 04/2017. -She denies any recent episodes of chest discomfort. EKG this admission shows no acute ischemic changes.  - continue BB and statin therapy.    For questions or updates, please contact [facility] consult [email or web address] for contact info under Cardiology/STEMI.  Signed, BrErma HeritagePA-C 06/13/2017, 7:44 AM Pager: [phone] Attending note:  Patient seen and examined.  I reviewed interval chart including recent telephone notes and discussed the case with Ms. StAhmed PrimaA-C.  She is currently admitted to the hospital with symptomatic anemia, hemoglobin down around 7 with guaiac positive stools.  She has not noticed any hematochezia or other obvious bleeding, just complaining of worsening shortness of breath recently.  She has received 2 units of PRBCs and has pending evaluation with Dr. ReLaural Goldenin fact had already been scheduled to undergo an outpatient endoscopy.   She reports compliance with her cardiac medications, no obvious increasing palpitations or angina symptoms.  She had a recent cardiac catheterization in April demonstrating patent BMS to the RCA.  Recent LVEF was approximately 25%.  She has undergone EP assessment with pending plan for Tikosyn initiation in the hospital after amiodarone levels have been reviewed, this is being deferred at this time pending further GI evaluation.  She is  in no distress this morning.  Systolic blood pressure 35-573 range with heart rate in the 70s in atrial fibrillation.  Lungs are clear without labored breathing.  Cardiac exam reveals irregularly irregular rhythm with indistinct PMI.  Lab work shows potassium 3.4, BUN 20, creatinine 0.87, normal AST and ALT, negative troponin I levels, hemoglobin 7.1, platelets 412.  ECG shows coarse atrial fibrillation with controlled rate  and left bundle branch block.  Symptomatic anemia with hemoglobin stable around 7 despite 2 units PRBCs.  She has pending evaluation per Dr. Laural Golden, likely to pursue endoscopy/colonoscopy.  She is not on aspirin at this time and Xarelto has been held appropriately pending further work-up.  GI issues and anemia need to be clarified prior to considering subsequent Tikosyn admission as she would need to have uninterrupted anticoagulation with cardioversion for at least 4 weeks thereafter.  Otherwise continue Toprol-XL, Lipitor, Lasix, and potassium supplements.  Satira Sark, M.D., F.A.C.C.

## 2017-06-14 ENCOUNTER — Encounter (HOSPITAL_COMMUNITY): Payer: Self-pay

## 2017-06-14 ENCOUNTER — Encounter (HOSPITAL_COMMUNITY)
Admission: EM | Disposition: A | Discharge status: Home / Self Care | Payer: Self-pay | Source: Home / Self Care | Attending: Family Medicine

## 2017-06-14 ENCOUNTER — Inpatient Hospital Stay (HOSPITAL_COMMUNITY): Payer: Medicare Other | Admitting: Anesthesiology

## 2017-06-14 DIAGNOSIS — I509 Heart failure, unspecified: Secondary | ICD-10-CM | POA: Diagnosis not present

## 2017-06-14 DIAGNOSIS — K449 Diaphragmatic hernia without obstruction or gangrene: Secondary | ICD-10-CM | POA: Diagnosis not present

## 2017-06-14 DIAGNOSIS — I2510 Atherosclerotic heart disease of native coronary artery without angina pectoris: Secondary | ICD-10-CM | POA: Diagnosis not present

## 2017-06-14 DIAGNOSIS — D500 Iron deficiency anemia secondary to blood loss (chronic): Secondary | ICD-10-CM | POA: Diagnosis not present

## 2017-06-14 HISTORY — PX: GIVENS CAPSULE STUDY: SHX5432

## 2017-06-14 HISTORY — PX: ESOPHAGOGASTRODUODENOSCOPY (EGD) WITH PROPOFOL: SHX5813

## 2017-06-14 LAB — BPAM RBC
BLOOD PRODUCT EXPIRATION DATE: 201907032359
Blood Product Expiration Date: 201906172359
ISSUE DATE / TIME: 201906051202
ISSUE DATE / TIME: 201906061849
Unit Type and Rh: 6200
Unit Type and Rh: 6200

## 2017-06-14 LAB — CBC
HCT: 29.4 % — ABNORMAL LOW (ref 36.0–46.0)
Hemoglobin: 9.1 g/dL — ABNORMAL LOW (ref 12.0–15.0)
MCH: 23.7 pg — AB (ref 26.0–34.0)
MCHC: 31 g/dL (ref 30.0–36.0)
MCV: 76.6 fL — AB (ref 78.0–100.0)
PLATELETS: 430 10*3/uL — AB (ref 150–400)
RBC: 3.84 MIL/uL — ABNORMAL LOW (ref 3.87–5.11)
RDW: 20 % — AB (ref 11.5–15.5)
WBC: 8.3 10*3/uL (ref 4.0–10.5)

## 2017-06-14 LAB — TYPE AND SCREEN
ABO/RH(D): A POS
Antibody Screen: NEGATIVE
UNIT DIVISION: 0
Unit division: 0

## 2017-06-14 LAB — COMPREHENSIVE METABOLIC PANEL
ALBUMIN: 3.4 g/dL — AB (ref 3.5–5.0)
ALK PHOS: 46 U/L (ref 38–126)
ALT: 8 U/L — AB (ref 14–54)
AST: 16 U/L (ref 15–41)
Anion gap: 9 (ref 5–15)
BUN: 15 mg/dL (ref 6–20)
CALCIUM: 9 mg/dL (ref 8.9–10.3)
CHLORIDE: 104 mmol/L (ref 101–111)
CO2: 24 mmol/L (ref 22–32)
CREATININE: 0.82 mg/dL (ref 0.44–1.00)
GFR calc Af Amer: >60 mL/min (ref >60)
GFR calc non Af Amer: >60 mL/min (ref >60)
GLUCOSE: 89 mg/dL (ref 65–99)
Potassium: 3.9 mmol/L (ref 3.5–5.1)
SODIUM: 137 mmol/L (ref 135–145)
Total Bilirubin: 3.2 mg/dL — ABNORMAL HIGH (ref 0.3–1.2)
Total Protein: 6.2 g/dL — ABNORMAL LOW (ref 6.5–8.1)

## 2017-06-14 LAB — PROTIME-INR
INR: 1.5
PROTHROMBIN TIME: 18 s — AB (ref 11.4–15.2)

## 2017-06-14 SURGERY — ESOPHAGOGASTRODUODENOSCOPY (EGD) WITH PROPOFOL
Anesthesia: Monitor Anesthesia Care

## 2017-06-14 MED ORDER — PROPOFOL 10 MG/ML IV BOLUS
INTRAVENOUS | Status: DC | PRN
  Administered 2017-06-14: 40 mg via INTRAVENOUS

## 2017-06-14 MED ORDER — PROPOFOL 500 MG/50ML IV EMUL
INTRAVENOUS | Status: DC | PRN
  Administered 2017-06-14: 100 ug/kg/min via INTRAVENOUS

## 2017-06-14 MED ORDER — LACTATED RINGERS IV SOLN
INTRAVENOUS | Status: DC
  Administered 2017-06-14: 08:00:00 via INTRAVENOUS

## 2017-06-14 MED ORDER — SODIUM CHLORIDE 0.9% FLUSH
INTRAVENOUS | Status: AC
  Filled 2017-06-14: qty 10

## 2017-06-14 NOTE — Progress Notes (Signed)
PROGRESS NOTE    [name]  WJX:[id number]  DOB: [birth year]  DOA: 06/12/2017 PCP: Rory Percy, MD   Brief Admission Hx: [name] is a 84 y.o. female with a history of ulcerative colitis paroxysmal atrial fibrillation, congestive heart failure, ulcerative colitis, macular degeneration hypertension, hyperlipidemia, depression, and bladder cancer. She takes Xarelto to manage CHF and Afib. She was sent to the ED by Dr. Olevia Perches office due to positive guaiac test and hemoglobin of 7.0 as of Monday. Patient denies pain, but does state that she has been feeling fatigued and has been having black stools off and on for past week. She states she has had no appetite and has lost 33 lbs since April  MDM/Assessment & Plan:   1. Symptomatic Anemia - Hemoglobin of 6.8 on admission and increased to 9.0 after 2 units PRBC.  Pt feeling much better. GI did EGD and capsule study today.  Await findings.  2. Atrial fib - Currently stable. Was on Xarelto. Anticoagulation stopped due to bleeding.  3. Chronic combined systolic and diastolic CHF - Currently stable. Being treated with Xarelto. Anticoagulation stopped due to bleeding.. 4. Hypertension - Stable will continue home medication while in patient. 5. Hyperlipidemia - Stable, will continue home medication while in patient. 6. Melena - Patient had positive guaiac; EGD and capsule study today.  DVT Prophylaxis: SCDs Code Status: FULL CODE  Family Communication: Husband, daughter at bedside.  Disposition Plan: ongoing GI workup in progress  Consultants:  GI  Cardiology  Subjective: Pt says she feels better after 2 units PRBC, tolerated EGD and capsule study.  Objective: Vitals:   06/14/17 0945 06/14/17 1000 06/14/17 1045 06/14/17 1319  BP: (!) 95/49 94/63 98/72  112/74  Pulse: (!) 38 (!) 59 97 84  Resp: 20 19 19 19   Temp:   98 F (36.7 C) 97.9 F (36.6 C)  TempSrc:   Oral Oral  SpO2: 94% 95% 90% 99%  Weight:      Height:         Intake/Output Summary (Last 24 hours) at 06/14/2017 1558 Last data filed at 06/14/2017 2130 Gross per 24 hour  Intake 655 ml  Output 1700 ml  Net -1045 ml   Filed Weights   06/12/17 0924 06/13/17 0640 06/14/17 0500  Weight: 87.8 kg (193 lb 9.6 oz) 88.8 kg (195 lb 12.3 oz) 92.2 kg (203 lb 4.2 oz)   REVIEW OF SYSTEMS  As per history otherwise all reviewed and reported negative  Exam:  General exam: awake, alert, NAD, cooperative.  Respiratory system: Clear. No increased work of breathing. Cardiovascular system: S1 & S2 heard, RRR. No JVD, murmurs, gallops, clicks or pedal edema. Gastrointestinal system: Abdomen is nondistended, soft and nontender. Normal bowel sounds heard. Central nervous system: Alert and oriented. No focal neurological deficits. Extremities: no CCE.  Data Reviewed: Basic Metabolic Panel: Recent Labs  Lab 06/12/17 0938 06/13/17 0454 06/14/17 0639  NA 136 139 137  K 3.9 3.4* 3.9  CL 102 107 104  CO2 24 25 24   GLUCOSE 100* 86 89  BUN 25* 21* 15  CREATININE 0.95 0.87 0.82  CALCIUM 9.1 8.9 9.0   Liver Function Tests: Recent Labs  Lab 06/12/17 0938 06/13/17 0454 06/14/17 0639  AST 19 14* 16  ALT 8* 9* 8*  ALKPHOS 48 41 46  BILITOT 1.2 2.5* 3.2*  PROT 6.5 5.5* 6.2*  ALBUMIN 3.5 3.0* 3.4*   No results for input(s): LIPASE, AMYLASE in the last 168 hours. No results  for input(s): AMMONIA in the last 168 hours. CBC: Recent Labs  Lab 06/10/17 1018 06/12/17 0938 06/13/17 0454 06/14/17 0639  WBC 7.3 6.2 6.6 8.3  NEUTROABS 4,701 3.8  --   --   HGB 7.0* 6.8* 7.1* 9.1*  HCT 23.3* 22.2* 22.4* 29.4*  MCV 74.0* 71.6* 72.0* 76.6*  PLT 485* 465* 412* 430*   Cardiac Enzymes: Recent Labs  Lab 06/12/17 0938 06/12/17 1508 06/12/17 1814  TROPONINI <0.03 <0.03 <0.03   CBG (last 3)  No results for input(s): GLUCAP in the last 72 hours. Recent Results (from the past 240 hour(s))  Urine culture     Status: None   Collection Time: 06/12/17  9:38  AM  Result Value Ref Range Status   Specimen Description   Final    URINE, CLEAN CATCH Performed at [facility], [address] [place], [place] [address]    Special Requests   Final    NONE Performed at [facility], [address], [place], [place] [address]    Culture   Final    NO GROWTH Performed at [facility], [place] [address], [place], [place] [address]    Report Status 06/13/2017 FINAL  Final     Studies: No results found.  Scheduled Meds: . atorvastatin  20 mg Oral QHS  . feeding supplement  1 Container Oral TID BM  . furosemide  60 mg Oral BID  . metoprolol succinate  50 mg Oral BID WC  . potassium chloride  40 mEq Oral BID  . sodium chloride flush  3 mL Intravenous Q12H   Continuous Infusions: . sodium chloride     Principal Problem:   Symptomatic anemia Active Problems:   Mixed hyperlipidemia   Essential hypertension, benign   A-fib (HCC)   Chronic combined systolic and diastolic CHF, NYHA class 3 (HCC)   Melena   Malnutrition of moderate degree   Hypotension due to blood loss  Time spent:   Irwin Brakeman, MD, FAAFP [facility] Pager [phone]  If 7PM-7AM, please contact night-coverage [email or web address] Password [email or web address] 06/14/2017, 3:58 PM    LOS: 2 days

## 2017-06-14 NOTE — Transfer of Care (Signed)
Immediate Anesthesia Transfer of Care Note  Patient: [name]  Procedure(s) Performed: ESOPHAGOGASTRODUODENOSCOPY (EGD) WITH PROPOFOL (N/A ) GIVENS CAPSULE STUDY  Patient Location: PACU  Anesthesia Type:MAC  Level of Consciousness: awake, alert  and patient cooperative  Airway & Oxygen Therapy: Patient Spontanous Breathing  Post-op Assessment: Report given to RN and Post -op Vital signs reviewed and stable  Post vital signs: Reviewed and stable  Last Vitals:  Vitals Value Taken Time  BP    Temp    Pulse 28 06/14/2017  9:38 AM  Resp    SpO2 93 % 06/14/2017  9:38 AM  Vitals shown include unvalidated device data.  Last Pain:  Vitals:   06/14/17 0723  TempSrc: Oral  PainSc: 0-No pain         Complications: No apparent anesthesia complications

## 2017-06-14 NOTE — Addendum Note (Signed)
Addendum  created 06/14/17 1318 by Vista Deck, CRNA   Order list changed

## 2017-06-14 NOTE — Anesthesia Postprocedure Evaluation (Signed)
Anesthesia Post Note  Patient: [name]  Procedure(s) Performed: ESOPHAGOGASTRODUODENOSCOPY (EGD) WITH PROPOFOL (N/A ) GIVENS CAPSULE STUDY  Patient location during evaluation: PACU Anesthesia Type: MAC Level of consciousness: awake and alert and patient cooperative Pain management: satisfactory to patient Vital Signs Assessment: post-procedure vital signs reviewed and stable Respiratory status: spontaneous breathing Cardiovascular status: stable Postop Assessment: no apparent nausea or vomiting Anesthetic complications: no     Last Vitals:  Vitals:   06/14/17 0545 06/14/17 0723  BP: 95/66 116/77  Pulse: 64 86  Resp:  16  Temp: 36.7 C 36.6 C  SpO2: 100% 99%    Last Pain:  Vitals:   06/14/17 0723  TempSrc: Oral  PainSc: 0-No pain                 Leotha Voeltz

## 2017-06-14 NOTE — Progress Notes (Addendum)
   Patient is currently in Endo for planned EGD in the setting of her anemia and melena. Received an additional unit of pRBC's overnight and Hgb is at 9.1 this AM. Telemetry reviewed and shows atrial fibrillation but HR has overall been well-controlled in the 60's to 90's with occasional pauses (longest at 2.06 seconds overnight). Hold parameters in place for Toprol-XL given episodes of hypotension. Anticoagulation held due to active bleeding.  Signed, Erma Heritage, PA-C 06/14/2017, 8:30 AM Pager: [phone]  Satira Sark, M.D., F.A.C.C.

## 2017-06-14 NOTE — Anesthesia Preprocedure Evaluation (Signed)
Anesthesia Evaluation  Patient identified by MRN, date of birth, ID band Patient awake    Reviewed: Allergy & Precautions, H&P , NPO status , Patient's Chart, lab work & pertinent test results, reviewed documented beta blocker date and time   Airway Mallampati: II  TM Distance: >3 FB Neck ROM: full    Dental no notable dental hx.    Pulmonary neg pulmonary ROS,    Pulmonary exam normal breath sounds clear to auscultation       Cardiovascular Exercise Tolerance: Good hypertension, + CAD and +CHF  negative cardio ROS   Rhythm:regular Rate:Normal     Neuro/Psych PSYCHIATRIC DISORDERS Depression negative neurological ROS  negative psych ROS   GI/Hepatic negative GI ROS, Neg liver ROS, PUD,   Endo/Other  negative endocrine ROS  Renal/GU negative Renal ROS  negative genitourinary   Musculoskeletal   Abdominal   Peds  Hematology negative hematology ROS (+) anemia ,   Anesthesia Other Findings   Reproductive/Obstetrics negative OB ROS                             Anesthesia Physical Anesthesia Plan  ASA: III  Anesthesia Plan: MAC   Post-op Pain Management:    Induction:   PONV Risk Score and Plan:   Airway Management Planned:   Additional Equipment:   Intra-op Plan:   Post-operative Plan:   Informed Consent: I have reviewed the patients History and Physical, chart, labs and discussed the procedure including the risks, benefits and alternatives for the proposed anesthesia with the patient or authorized representative who has indicated his/her understanding and acceptance.     Plan Discussed with: CRNA  Anesthesia Plan Comments:         Anesthesia Quick Evaluation

## 2017-06-14 NOTE — Anesthesia Procedure Notes (Signed)
Procedure Name: MAC Date/Time: 06/14/2017 9:02 AM Performed by: Vista Deck, CRNA Pre-anesthesia Checklist: Patient identified, Emergency Drugs available, Suction available, Timeout performed and Patient being monitored Patient Re-evaluated:Patient Re-evaluated prior to induction Oxygen Delivery Method: Nasal Cannula

## 2017-06-14 NOTE — Progress Notes (Signed)
Patient has no complaints. She feels much better since she received a second unit of PRBCs. She denies shortness of breath or chest pain. Hemoglobin is 9.1 g this morning INR 1.50.  Patient is agreeable to proceed with endoscopic placement of given capsule into her stomach or duodenum if no bleeding lesion identified in upper GI tract.

## 2017-06-14 NOTE — Op Note (Signed)
[facility] Patient Name: [name] Procedure Date: 06/14/2017 8:35 AM MRN: [id number] Date of Birth: 1931-01-02 Attending MD: Hildred Laser , MD CSN: [id number] Age: 84 Admit Type: Outpatient Procedure:                Upper GI endoscopy Indications:              Iron deficiency anemia secondary to chronic blood                            loss Providers:                Hildred Laser, MD, Lurline Del, RN, Randa Spike,                            Technician Referring MD:             Irwin Brakeman, MD Medicines:                Propofol per Anesthesia Complications:            No immediate complications. Estimated Blood Loss:     Estimated blood loss: none. Procedure:                Pre-Anesthesia Assessment:                           - Prior to the procedure, a History and Physical                            was performed, and patient medications and                            allergies were reviewed. The patient's tolerance of                            previous anesthesia was also reviewed. The risks                            and benefits of the procedure and the sedation                            options and risks were discussed with the patient.                            All questions were answered, and informed consent                            was obtained. Prior Anticoagulants: The patient has                            taken no previous anticoagulant or antiplatelet                            agents and last took aspirin 2 days and Xarelto                            (  rivaroxaban) 2 days prior to the procedure. ASA                            Grade Assessment: III - A patient with severe                            systemic disease. After reviewing the risks and                            benefits, the patient was deemed in satisfactory                            condition to undergo the procedure.                           After obtaining informed consent, the  endoscope was                            passed under direct vision. Throughout the                            procedure, the patient's blood pressure, pulse, and                            oxygen saturations were monitored continuously. The                            EG-[id number] ([id number]) scope was introduced through the                            and advanced to the second part of duodenum. The                            upper GI endoscopy was accomplished without                            difficulty. The patient tolerated the procedure                            well. Scope In: 9:16:31 AM Scope Out: 9:24:12 AM Total Procedure Duration: 0 hours 7 minutes 41 seconds  Findings:      The examined esophagus was normal.      The Z-line was regular and was found 38 cm from the incisors.      A 2 cm hiatal hernia was present.      The entire examined stomach was normal.      The duodenal bulb and second portion of the duodenum were normal. Using       the endoscope, the video capsule enteroscope was advanced into the       duodenal bulb. Impression:               - Normal esophagus.                           - Z-line regular, 38 cm  from the incisors.                           - 2 cm hiatal hernia.                           - Normal stomach.                           - Normal duodenal bulb and second portion of the                            duodenum.                           - Successful completion of the Video Capsule                            Enteroscope placement.                           - No specimens collected. Moderate Sedation:      Per Anesthesia Care Recommendation:           - Return patient to hospital ward for ongoing care.                           - Clear liquid diet today.                           - Continue present medications.                           - Will review Given capsule study in am. Procedure Code(s):        --- Professional ---                            [phone], Esophagogastroduodenoscopy, flexible,                            transoral; diagnostic, including collection of                            specimen(s) by brushing or washing, when performed                            (separate procedure) Diagnosis Code(s):        --- Professional ---                           K44.9, Diaphragmatic hernia without obstruction or                            gangrene                           D50.0, Iron deficiency anemia secondary to blood  loss (chronic) CPT copyright 2017 American Medical Association. All rights reserved. The codes documented in this report are preliminary and upon coder review may  be revised to meet current compliance requirements. Hildred Laser, MD Hildred Laser, MD 06/14/2017 9:34:46 AM This report has been signed electronically. Number of Addenda: 0

## 2017-06-15 LAB — CBC
HEMATOCRIT: 28.1 % — AB (ref 36.0–46.0)
Hemoglobin: 8.7 g/dL — ABNORMAL LOW (ref 12.0–15.0)
MCH: 23.8 pg — ABNORMAL LOW (ref 26.0–34.0)
MCHC: 31 g/dL (ref 30.0–36.0)
MCV: 77 fL — AB (ref 78.0–100.0)
Platelets: 419 10*3/uL — ABNORMAL HIGH (ref 150–400)
RBC: 3.65 MIL/uL — ABNORMAL LOW (ref 3.87–5.11)
RDW: 20.8 % — ABNORMAL HIGH (ref 11.5–15.5)
WBC: 8.5 10*3/uL (ref 4.0–10.5)

## 2017-06-15 MED ORDER — RIVAROXABAN 20 MG PO TABS: 20.0000 mg | ORAL_TABLET | Freq: Every day | ORAL | 6 refills | Status: DC

## 2017-06-15 NOTE — Discharge Instructions (Signed)
PLEASE STOP TAKING THE ASPIRIN.  RESTART TAKING YOUR XARELTO (RIVAROXABAN) ON Tuesday 06/18/17. PLEASE START TAKING THE FLINTSTONE CHEWABLE VITAMINS WITH IRON TWICE DAILY AS RECOMMENDED BY DR. Laural Golden.  SEEK MEDICAL CARE OR RETURN TO EMERGENCY ROOM IF SYMPTOMS RECUR, WORSEN OR NEW PROBLEMS DEVELOP.  FOLLOW UP WITH DR. Olevia Perches OFFICE ON 6/13 TO HAVE YOUR HEMOGLOBIN CHECKED.     Follow with Primary MD  Rory Percy, MD  and other consultants as instructed your Hospitalist MD  Please get a complete blood count and chemistry panel checked by your Primary MD at your next visit, and again as instructed by your Primary MD.  Get Medicines reviewed and adjusted: Please take all your medications with you for your next visit with your Primary MD  Laboratory/radiological data: Please request your Primary MD to go over all hospital tests and procedure/radiological results at the follow up, please ask your Primary MD to get all Hospital records sent to his/her office.  In some cases, they will be blood work, cultures and biopsy results pending at the time of your discharge. Please request that your primary care M.D. follows up on these results.  Also Note the following: If you experience worsening of your admission symptoms, develop shortness of breath, life threatening emergency, suicidal or homicidal thoughts you must seek medical attention immediately by calling 911 or calling your MD immediately  if symptoms less severe.  You must read complete instructions/literature along with all the possible adverse reactions/side effects for all the Medicines you take and that have been prescribed to you. Take any new Medicines after you have completely understood and accpet all the possible adverse reactions/side effects.   Do not drive when taking Pain medications or sleeping medications (Benzodaizepines)  Do not take more than prescribed Pain, Sleep and Anxiety Medications. It is not advisable to combine  anxiety,sleep and pain medications without talking with your primary care practitioner  Special Instructions: If you have smoked or chewed Tobacco  in the last 2 yrs please stop smoking, stop any regular Alcohol  and or any Recreational drug use.  Wear Seat belts while driving.  Please note: You were cared for by a hospitalist during your hospital stay. Once you are discharged, your primary care physician will handle any further medical issues. Please note that NO REFILLS for any discharge medications will be authorized once you are discharged, as it is imperative that you return to your primary care physician (or establish a relationship with a primary care physician if you do not have one) for your post hospital discharge needs so that they can reassess your need for medications and monitor your lab values.      Blood Transfusion, Care After This sheet gives you information about how to care for yourself after your procedure. Your doctor may also give you more specific instructions. If you have problems or questions, contact your doctor. Follow these instructions at home:  Take over-the-counter and prescription medicines only as told by your doctor.  Go back to your normal activities as told by your doctor.  Follow instructions from your doctor about how to take care of the area where an IV tube was put into your vein (insertion site). Make sure you: ? Wash your hands with soap and water before you change your bandage (dressing). If there is no soap and water, use hand sanitizer. ? Change your bandage as told by your doctor.  Check your IV insertion site every day for signs of infection. Check for: ?  More redness, swelling, or pain. ? More fluid or blood. ? Warmth. ? Pus or a bad smell. Contact a doctor if:  You have more redness, swelling, or pain around the IV insertion site..  You have more fluid or blood coming from the IV insertion site.  Your IV insertion site feels warm  to the touch.  You have pus or a bad smell coming from the IV insertion site.  Your pee (urine) turns pink, red, or brown.  You feel weak after doing your normal activities. Get help right away if:  You have signs of a serious allergic or body defense (immune) system reaction, including: ? Itchiness. ? Hives. ? Trouble breathing. ? Anxiety. ? Pain in your chest or lower back. ? Fever, flushing, and chills. ? Fast pulse. ? Rash. ? Watery poop (diarrhea). ? Throwing up (vomiting). ? Dark pee. ? Serious headache. ? Dizziness. ? Stiff neck. ? Yellow color in your face or the white parts of your eyes (jaundice). Summary  After a blood transfusion, return to your normal activities as told by your doctor.  Every day, check for signs of infection where the IV tube was put into your vein.  Some signs of infection are warm skin, more redness and pain, more fluid or blood, and pus or a bad smell where the needle went in.  Contact your doctor if you feel weak or have any unusual symptoms. This information is not intended to replace advice given to you by your health care provider. Make sure you discuss any questions you have with your health care provider. Document Released: 01/15/2014 Document Revised: 08/19/2015 Document Reviewed: 08/19/2015 Elsevier Interactive Patient Education  2017 [employer].

## 2017-06-15 NOTE — Op Note (Signed)
Small Bowel Givens Capsule Study Procedure date: 06/14/2017  Referring Provider: Irwin Brakeman, MD PCP:  Dr. Rory Percy, MD  Indication for procedure:   Patient is 84 year old Caucasian female who has chronic atrial fibrillation as well as cardiomyopathy who is on low-dose aspirin and Xarelto who presented with melena and anemia.  She has a history of ulcerative colitis and has remained in remission. He underwent esophagogastroduodenoscopy yesterday and no bleeding lesion was identified.  Therefore given capsule was placed into the duodenum to perform evaluation of small bowel.   Findings:   No esophageal or stomach images available as given capsule was placed into the duodenum endoscopically. Small ulcer covered with eschar noted; best seen on image at [id number].  Small amount of black liquid noted in distal small bowel felt to be coffee-ground material.  No burgundy or bright red blood noted. Small submucosal lesion noted at [id number] most consistent with a lipoma.  First Gastric image: Not applicable. First Duodenal image: 2 min and 20 sec First Ileo-Cecal Valve image: 7 hrs 18 min and 43 sec First Cecal image: 7 hrs 41 min and 4 sec Gastric Passage time: Not applicable Small Bowel Passage time: 7 hrs and 38 min  Summary & Recommendations:  Study is complete as capsule reached cecum. Single small ulcer at small bowel covered with brown eschar but no active bleeding. Small amount of coffee-ground material noted in distal small bowel. Incidental finding of small submucosal lesion consistent with submucosal lipoma.  Consider stopping aspirin. Resume anticoagulant in 3 days. She will need further work-up if bleeding recurs.

## 2017-06-15 NOTE — Progress Notes (Signed)
  Subjective:  Patient has no complaints.  She feels stronger.  She is walking to the bathroom with some help.  She denies chest pain or shortness of breath while resting.  She also denies abdominal pain.  She has not had a BM since yesterday.  Objective: Blood pressure 98/63, pulse 60, temperature 97.8 F (36.6 C), temperature source Oral, resp. rate 16, height 5' 7"  (1.702 m), weight 193 lb 9.6 oz (87.8 kg), SpO2 98 %. Patient appears less pale. Abdomen is full but soft and nontender with organomegaly or masses.  Labs/studies Results:  Recent Labs    07-Jul-2017 0454 06/14/17 0639 06/15/17 0614  WBC 6.6 8.3 8.5  HGB 7.1* 9.1* 8.7*  HCT 22.4* 29.4* 28.1*  PLT 412* 430* 419*    BMET  Recent Labs    2017/07/07 0454 06/14/17 0639  NA 139 137  K 3.4* 3.9  CL 107 104  CO2 25 24  GLUCOSE 86 89  BUN 21* 15  CREATININE 0.87 0.82  CALCIUM 8.9 9.0    LFT  Recent Labs    07-07-17 0454 06/14/17 0639  PROT 5.5* 6.2*  ALBUMIN 3.0* 3.4*  AST 14* 16  ALT 9* 8*  ALKPHOS 41 46  BILITOT 2.5* 3.2*    PT/INR  Recent Labs    06/14/17 0639  LABPROT 18.0*  INR 1.50    Small bowel given capsule study reveals single small ulcer without stigmata of bleeding and small bowel and Dr. liquid felt to be coffee-ground material in distal small bowel. Please see separate report.  Assessment:  #1.  GI bleed most likely from small bowel ulcer.  Active bleeding not noted on small bowel study.  If patient has a recurrent GI bleed would consider GI bleeding scan and colonoscopy.  #2.  Iron deficiency anemia secondary to chronic GI bleed in the setting of antiplatelet and anticoagulatant therapy.  Patient has received 2 units of PRBCs.  Hemoglobin is low but stable.  #3.  Anorexia most likely related to amiodarone which has been discontinued.  Recommendations:  Begin Flintstone chewable with iron 1 tablet twice daily when discharge. Consider stopping aspirin and resuming anticoagulant in 3  days. Will check H&H on 06/20/2017. Patient advised to return to emergency room if she has frank melena or rectal bleeding or symptoms thereof.

## 2017-06-15 NOTE — Progress Notes (Signed)
PROGRESS NOTE    [name]  DJS:[id number]  DOB: Feb 06, 1930  DOA: 06/12/2017 PCP: Rory Percy, MD   Brief Admission Hx: [name] is a 84 y.o. female with a history of ulcerative colitis paroxysmal atrial fibrillation, congestive heart failure, ulcerative colitis, macular degeneration hypertension, hyperlipidemia, depression, and bladder cancer. She takes Xarelto to manage CHF and Afib. She was sent to the ED by Dr. Olevia Perches office due to positive guaiac test and hemoglobin of 7.0 as of Monday. Patient denies pain, but does state that she has been feeling fatigued and has been having black stools off and on for past week. She states she has had no appetite and has lost 33 lbs since April  MDM/Assessment & Plan:   1. Symptomatic Anemia - Hemoglobin of 6.8 on admission and increased to 9.0 after 2 units PRBC.  Pt feeling much better. GI did EGD and capsule study results are pending.  Await GI recommendations.  2. Atrial fib - Currently stable. Was on Xarelto. Anticoagulation stopped due to active GI bleeding.  3. Chronic combined systolic and diastolic CHF - Currently stable. 4. Hypertension - Stable will continue home medication while in patient. 5. Hyperlipidemia - Stable, will continue home medication while in patient. 6. Melena - Patient had positive guaiac; EGD and capsule study today.  DVT Prophylaxis: SCDs Code Status: FULL CODE  Family Communication: daughter at bedside.  Disposition Plan: ongoing GI workup in progress  Consultants:  GI  Cardiology  Subjective: Pt says she feels better, no melena, no hematochezia  Objective: Vitals:   06/14/17 1045 06/14/17 1319 06/14/17 2023 06/15/17 0607  BP: 98/72 112/74 114/61 98/63  Pulse: 97 84 68 60  Resp: 19 19 16 16   Temp: 98 F (36.7 C) 97.9 F (36.6 C) 98.7 F (37.1 C) 97.8 F (36.6 C)  TempSrc: Oral Oral Oral Oral  SpO2: 90% 99% 98% 98%  Weight:    87.8 kg (193 lb 9.6 oz)  Height:         Intake/Output Summary (Last 24 hours) at 06/15/2017 8850 Last data filed at 06/15/2017 0617 Gross per 24 hour  Intake 600 ml  Output 1204 ml  Net -604 ml   Filed Weights   06/13/17 0640 06/14/17 0500 06/15/17 2774  Weight: 88.8 kg (195 lb 12.3 oz) 92.2 kg (203 lb 4.2 oz) 87.8 kg (193 lb 9.6 oz)   REVIEW OF SYSTEMS  As per history otherwise all reviewed and reported negative  Exam:  General exam: awake, alert, NAD, cooperative.  Respiratory system: Clear. No increased work of breathing. Cardiovascular system: S1 & S2 heard, RRR. No JVD, murmurs, gallops, clicks or pedal edema. Gastrointestinal system: Abdomen is nondistended, soft and nontender. Normal bowel sounds heard. Central nervous system: Alert and oriented. No focal neurological deficits. Extremities: no CCE.  Data Reviewed: Basic Metabolic Panel: Recent Labs  Lab 06/12/17 0938 06/13/17 0454 06/14/17 0639  NA 136 139 137  K 3.9 3.4* 3.9  CL 102 107 104  CO2 24 25 24   GLUCOSE 100* 86 89  BUN 25* 21* 15  CREATININE 0.95 0.87 0.82  CALCIUM 9.1 8.9 9.0   Liver Function Tests: Recent Labs  Lab 06/12/17 0938 06/13/17 0454 06/14/17 0639  AST 19 14* 16  ALT 8* 9* 8*  ALKPHOS 48 41 46  BILITOT 1.2 2.5* 3.2*  PROT 6.5 5.5* 6.2*  ALBUMIN 3.5 3.0* 3.4*   No results for input(s): LIPASE, AMYLASE in the last 168 hours. No results for input(s):  AMMONIA in the last 168 hours. CBC: Recent Labs  Lab 06/10/17 1018 06/12/17 0938 06/13/17 0454 06/14/17 0639 06/15/17 0614  WBC 7.3 6.2 6.6 8.3 8.5  NEUTROABS 4,701 3.8  --   --   --   HGB 7.0* 6.8* 7.1* 9.1* 8.7*  HCT 23.3* 22.2* 22.4* 29.4* 28.1*  MCV 74.0* 71.6* 72.0* 76.6* 77.0*  PLT 485* 465* 412* 430* 419*   Cardiac Enzymes: Recent Labs  Lab 06/12/17 0938 06/12/17 1508 06/12/17 1814  TROPONINI <0.03 <0.03 <0.03   CBG (last 3)  No results for input(s): GLUCAP in the last 72 hours. Recent Results (from the past 240 hour(s))  Urine culture      Status: None   Collection Time: 06/12/17  9:38 AM  Result Value Ref Range Status   Specimen Description   Final    URINE, CLEAN CATCH Performed at [facility], [address], [place], [place] [address]    Special Requests   Final    NONE Performed at [facility], [address] [place], [place] [address]    Culture   Final    NO GROWTH Performed at [facility], [place] [address] [place], [place] [address]    Report Status 06/13/2017 FINAL  Final     Studies: No results found.  Scheduled Meds: . atorvastatin  20 mg Oral QHS  . feeding supplement  1 Container Oral TID BM  . furosemide  60 mg Oral BID  . metoprolol succinate  50 mg Oral BID WC  . potassium chloride  40 mEq Oral BID  . sodium chloride flush  3 mL Intravenous Q12H   Continuous Infusions: . sodium chloride     Principal Problem:   Symptomatic anemia Active Problems:   Mixed hyperlipidemia   Essential hypertension, benign   A-fib (HCC)   Chronic combined systolic and diastolic CHF, NYHA class 3 (HCC)   Melena   Malnutrition of moderate degree   Hypotension due to blood loss  Time spent:   Irwin Brakeman, MD, FAAFP [facility] Pager [phone]  If 7PM-7AM, please contact night-coverage [email or web address] Password [email or web address] 06/15/2017, 9:23 AM    LOS: 3 days

## 2017-06-15 NOTE — Discharge Summary (Signed)
Physician Discharge Summary  [name]:[id number] DOB: December 31, 1930 DOA: 06/12/2017  PCP: Rory Percy, MD  Admit date: 06/12/2017 Discharge date: 06/15/2017  Admitted From: HOME  Disposition: HOME  Recommendations for Outpatient Follow-up:  1. Follow up with PCP in 2 weeks  2. FOLLOW UP WITH DR [facility] OFFICE 06/20/17 FOR CBC CHECK 3. FOLLOW UP WITH CARDIOLOGY IN 1 MONTH.   Discharge Condition: STABLE   CODE STATUS: FULL    Brief Hospitalization Summary: Please see all hospital notes, images, labs for full details of the hospitalization. Brief Admission Hx: [name] is a 84 y.o. female with a history of ulcerative colitis paroxysmal atrial fibrillation, congestive heart failure, ulcerative colitis, macular degeneration hypertension, hyperlipidemia, depression, and bladder cancer. She takes Xarelto to manage CHF and Afib. She was sent to the ED by Dr. Olevia Perches office due to positive guaiac test and hemoglobin of 7.0 as of Monday. Patient denies pain, but does state that she has been feeling fatigued and has been having black stools off and on for past week. She states she has had no appetite and has lost 33 lbs since April  MDM/Assessment & Plan:   1. Symptomatic Anemia - Hemoglobin of 6.8 on admission and increased to 9.0 after 2 units PRBC.  Pt feeling much better. GI did EGD and capsule study results were reviewed with patient. The GI bleeding was most likely from a small bowel ulcer.     The patient was given instructions to begin [place] chewable vitamin twice daily.  Stop aspirin and resume Xarelto in 3 days.  The patient will follow up with GI on 6/13 for an H&H test.  The patient was advised to return to the emergency room if she has frank melena or rectal bleeding.  The patient verbalized understanding.  The patient has been stable, ambulating and feeling much better and stable to discharge home today.   2. Atrial fib - Currently stable.  Patient can resume Xarelto in  3 days on 06/18/17.  She has been taken off amiodarone by the cardiology service. 3. Chronic combined systolic and diastolic CHF - Currently stable.  She was seen by cardiology and should follow-up with cardiology in 4 to 6 weeks. 4. Hypertension - Stable will continue home medication while in patient. 5. Hyperlipidemia - Stable, will continue home medication while in patient. 6. Melena - Patient had positive guaiac; EGD and capsule study completed.  If patient has a recurrent GI bleed would consider GI bleeding scan and colonoscopy.  DVT Prophylaxis:SCDs Code Status:FULL CODE  Family Communication:daughter at bedside.  Disposition Plan:ongoing GI workup in progress  Consultants:  GI  Cardiology  Discharge Diagnoses:  Principal Problem:   Symptomatic anemia Active Problems:   Mixed hyperlipidemia   Essential hypertension, benign   A-fib (HCC)   Chronic combined systolic and diastolic CHF, NYHA class 3 (HCC)   Melena   Malnutrition of moderate degree   Hypotension due to blood loss  Discharge Instructions: Discharge Instructions    Call MD for:  difficulty breathing, headache or visual disturbances   Complete by:  As directed    Call MD for:  extreme fatigue   Complete by:  As directed    Call MD for:  persistant dizziness or light-headedness   Complete by:  As directed    Call MD for:  persistant nausea and vomiting   Complete by:  As directed    Call MD for:  severe uncontrolled pain   Complete by:  As  directed    Increase activity slowly   Complete by:  As directed      Allergies as of 06/15/2017      Reactions   Sinus & Allergy [chlorpheniramine-phenylephrine] Shortness Of Breath   Demerol Rash   Penicillins Rash, Other (See Comments)   REACTION: rash, years ago Has patient had a PCN reaction causing immediate rash, facial/tongue/throat swelling, SOB or lightheadedness with hypotension: Yes Has patient had a PCN reaction causing severe rash involving mucus  membranes or skin necrosis: No Has patient had a PCN reaction that required hospitalization No Has patient had a PCN reaction occurring within the last 10 years: No If all of the above answers are "NO", then may proceed with Cephalosporin use.      Medication List    STOP taking these medications   ASPIRIN ADULT LOW STRENGTH 81 MG EC tablet Generic drug:  aspirin     TAKE these medications   acetaminophen 500 MG tablet Commonly known as:  TYLENOL Take 1,000 mg by mouth every 6 (six) hours as needed.   atorvastatin 20 MG tablet Commonly known as:  LIPITOR TAKE ONE TABLET BY MOUTH AT BEDTIME   balsalazide 750 MG capsule Commonly known as:  COLAZAL TAKE THREE CAPSULES BY MOUTH THREE TIMES DAILY (REPLACING DELZICOL)   furosemide 20 MG tablet Commonly known as:  LASIX TAKE THREE TABLETS BY MOUTH TWICE DAILY   ICAPS AREDS 2 PO Take 2 tablets by mouth daily at 6 PM.   lisinopril 5 MG tablet Commonly known as:  PRINIVIL,ZESTRIL Take 1 tablet (5 mg total) by mouth daily.   metoprolol succinate 50 MG 24 hr tablet Commonly known as:  TOPROL-XL Take 1 tablet (50 mg total) by mouth 2 (two) times daily with a meal. Take with or immediately following a meal.   nitroGLYCERIN 0.4 MG SL tablet Commonly known as:  NITROSTAT Place 1 tablet (0.4 mg total) under the tongue every 5 (five) minutes as needed for chest pain.   potassium chloride 10 MEQ tablet Commonly known as:  K-DUR,KLOR-CON TAKE THREE TABLETS BY MOUTH THREE TIMES DAILY   rivaroxaban 20 MG Tabs tablet Commonly known as:  XARELTO Take 1 tablet (20 mg total) by mouth daily with supper. Start taking on:  06/18/2017 What changed:  These instructions start on 06/18/2017. If you are unsure what to do until then, ask your doctor or other care provider.   THERATEARS ALLERGY 0.025 % ophthalmic solution Generic drug:  ketotifen Place 2 drops into both eyes 3 (three) times daily as needed (for dry eyes).      Follow-up  Information    Rory Percy, MD. Schedule an appointment as soon as possible for a visit in 2 week(s).   Specialty:  Family Medicine Contact information: [place] [address] [phone]        Satira Sark, MD. Schedule an appointment as soon as possible for a visit in 1 month(s).   Specialty:  Cardiology Contact information: [place] [address] [phone]        Rogene Houston, MD. Go on 06/20/2017.   Specialty:  Gastroenterology Why:  Go to have your hemoglobin checked at Dr. Olevia Perches office  Contact information: [place], [address] [place] [address] [phone]          Allergies  Allergen Reactions  . Sinus & Allergy [Chlorpheniramine-Phenylephrine] Shortness Of Breath  . Demerol Rash  . Penicillins Rash and Other (See Comments)  REACTION: rash, years ago Has patient had a PCN reaction causing immediate rash, facial/tongue/throat swelling, SOB or lightheadedness with hypotension: Yes Has patient had a PCN reaction causing severe rash involving mucus membranes or skin necrosis: No Has patient had a PCN reaction that required hospitalization No Has patient had a PCN reaction occurring within the last 10 years: No If all of the above answers are "NO", then may proceed with Cephalosporin use.    Allergies as of 06/15/2017      Reactions   Sinus & Allergy [chlorpheniramine-phenylephrine] Shortness Of Breath   Demerol Rash   Penicillins Rash, Other (See Comments)   REACTION: rash, years ago Has patient had a PCN reaction causing immediate rash, facial/tongue/throat swelling, SOB or lightheadedness with hypotension: Yes Has patient had a PCN reaction causing severe rash involving mucus membranes or skin necrosis: No Has patient had a PCN reaction that required hospitalization No Has patient had a PCN reaction occurring within the last 10 years: No If all of the above answers are "NO", then may proceed with  Cephalosporin use.      Medication List    STOP taking these medications   ASPIRIN ADULT LOW STRENGTH 81 MG EC tablet Generic drug:  aspirin     TAKE these medications   acetaminophen 500 MG tablet Commonly known as:  TYLENOL Take 1,000 mg by mouth every 6 (six) hours as needed.   atorvastatin 20 MG tablet Commonly known as:  LIPITOR TAKE ONE TABLET BY MOUTH AT BEDTIME   balsalazide 750 MG capsule Commonly known as:  COLAZAL TAKE THREE CAPSULES BY MOUTH THREE TIMES DAILY (REPLACING DELZICOL)   furosemide 20 MG tablet Commonly known as:  LASIX TAKE THREE TABLETS BY MOUTH TWICE DAILY   ICAPS AREDS 2 PO Take 2 tablets by mouth daily at 6 PM.   lisinopril 5 MG tablet Commonly known as:  PRINIVIL,ZESTRIL Take 1 tablet (5 mg total) by mouth daily.   metoprolol succinate 50 MG 24 hr tablet Commonly known as:  TOPROL-XL Take 1 tablet (50 mg total) by mouth 2 (two) times daily with a meal. Take with or immediately following a meal.   nitroGLYCERIN 0.4 MG SL tablet Commonly known as:  NITROSTAT Place 1 tablet (0.4 mg total) under the tongue every 5 (five) minutes as needed for chest pain.   potassium chloride 10 MEQ tablet Commonly known as:  K-DUR,KLOR-CON TAKE THREE TABLETS BY MOUTH THREE TIMES DAILY   rivaroxaban 20 MG Tabs tablet Commonly known as:  XARELTO Take 1 tablet (20 mg total) by mouth daily with supper. Start taking on:  06/18/2017 What changed:  These instructions start on 06/18/2017. If you are unsure what to do until then, ask your doctor or other care provider.   THERATEARS ALLERGY 0.025 % ophthalmic solution Generic drug:  ketotifen Place 2 drops into both eyes 3 (three) times daily as needed (for dry eyes).       Procedures/Studies:  No results found.   Subjective: The patient is feeling much better.  She has been ambulating in the room to the bathroom with no difficulty.  Her family is at her bedside.  She feels stable to discharge  home.  Discharge Exam: Vitals:   06/14/17 2023 06/15/17 0607  BP: 114/61 98/63  Pulse: 68 60  Resp: 16 16  Temp: 98.7 F (37.1 C) 97.8 F (36.6 C)  SpO2: 98% 98%   Vitals:   06/14/17 1045 06/14/17 1319 06/14/17 2023 06/15/17 0607  BP: 98/72 112/74  114/61 98/63  Pulse: 97 84 68 60  Resp: 19 19 16 16   Temp: 98 F (36.7 C) 97.9 F (36.6 C) 98.7 F (37.1 C) 97.8 F (36.6 C)  TempSrc: Oral Oral Oral Oral  SpO2: 90% 99% 98% 98%  Weight:    87.8 kg (193 lb 9.6 oz)  Height:        General: Pt is alert, awake, not in acute distress Cardiovascular: normal S1/S2 +, no rubs, no gallops Respiratory: CTA bilaterally, no wheezing, no rhonchi Abdominal: Soft, NT, ND, bowel sounds + Extremities: no edema, no cyanosis   The results of significant diagnostics from this hospitalization (including imaging, microbiology, ancillary and laboratory) are listed below for reference.     Microbiology: Recent Results (from the past 240 hour(s))  Urine culture     Status: None   Collection Time: 06/12/17  9:38 AM  Result Value Ref Range Status   Specimen Description   Final    URINE, CLEAN CATCH Performed at [facility], [address] [place], [place] [address]    Special Requests   Final    NONE Performed at [facility], [address] [place], [place] [address]    Culture   Final    NO GROWTH Performed at [facility], [place] [address], [place],  [address]    Report Status 06/13/2017 FINAL  Final     Labs: BNP (last 3 results) Recent Labs    02/01/17 1212 02/20/17 1253 02/24/17 0506  BNP 253.0* 465.0* 412.8*   Basic Metabolic Panel: Recent Labs  Lab 06/12/17 0938 06/13/17 0454 06/14/17 0639  NA 136 139 137  K 3.9 3.4* 3.9  CL 102 107 104  CO2 24 25 24   GLUCOSE 100* 86 89  BUN 25* 21* 15  CREATININE 0.95 0.87 0.82  CALCIUM 9.1 8.9 9.0   Liver Function Tests: Recent Labs  Lab 06/12/17 0938 06/13/17 0454 06/14/17 0639  AST 19 14* 16  ALT 8* 9* 8*   ALKPHOS 48 41 46  BILITOT 1.2 2.5* 3.2*  PROT 6.5 5.5* 6.2*  ALBUMIN 3.5 3.0* 3.4*   No results for input(s): LIPASE, AMYLASE in the last 168 hours. No results for input(s): AMMONIA in the last 168 hours. CBC: Recent Labs  Lab 06/10/17 1018 06/12/17 0938 06/13/17 0454 06/14/17 0639 06/15/17 0614  WBC 7.3 6.2 6.6 8.3 8.5  NEUTROABS 4,701 3.8  --   --   --   HGB 7.0* 6.8* 7.1* 9.1* 8.7*  HCT 23.3* 22.2* 22.4* 29.4* 28.1*  MCV 74.0* 71.6* 72.0* 76.6* 77.0*  PLT 485* 465* 412* 430* 419*   Cardiac Enzymes: Recent Labs  Lab 06/12/17 0938 06/12/17 1508 06/12/17 1814  TROPONINI <0.03 <0.03 <0.03   BNP: Invalid input(s): POCBNP CBG: No results for input(s): GLUCAP in the last 168 hours. D-Dimer No results for input(s): DDIMER in the last 72 hours. Hgb A1c No results for input(s): HGBA1C in the last 72 hours. Lipid Profile No results for input(s): CHOL, HDL, LDLCALC, TRIG, CHOLHDL, LDLDIRECT in the last 72 hours. Thyroid function studies No results for input(s): TSH, T4TOTAL, T3FREE, THYROIDAB in the last 72 hours.  Invalid input(s): FREET3 Anemia work up No results for input(s): VITAMINB12, FOLATE, FERRITIN, TIBC, IRON, RETICCTPCT in the last 72 hours. Urinalysis    Component Value Date/Time   COLORURINE STRAW (A) 06/12/2017 [place] 06/12/2017 0938   LABSPEC 1.008 06/12/2017 0938   PHURINE 7.0 06/12/2017 0938   GLUCOSEU NEGATIVE 06/12/2017  [place] 06/12/2017 [place] 06/12/2017 [place] 06/12/2017 0938   PROTEINUR NEGATIVE 06/12/2017 0938   NITRITE NEGATIVE 06/12/2017 0938   LEUKOCYTESUR NEGATIVE 06/12/2017 5391   Sepsis Labs Invalid input(s): PROCALCITONIN,  WBC,  LACTICIDVEN Microbiology Recent Results (from the past 240 hour(s))  Urine culture     Status: None   Collection Time: 06/12/17  9:38 AM  Result Value Ref Range Status   Specimen Description   Final    URINE, CLEAN  CATCH Performed at [facility], [address], [place], [place] [address]    Special Requests   Final    NONE Performed at [facility], [address], [place], [place] [address]    Culture   Final    NO GROWTH Performed at [facility], [place] [address] [place], [place] [address]    Report Status 06/13/2017 FINAL  Final    Time coordinating discharge: 30 MINUTES  SIGNED:  Irwin Brakeman, MD  [facility] 06/15/2017, 12:09 PM Pager [phone]  If 7PM-7AM, please contact night-coverage [email or web address] Password [email or web address]

## 2017-06-17 ENCOUNTER — Encounter

## 2017-06-17 ENCOUNTER — Encounter: Payer: Self-pay | Admitting: Physician Assistant

## 2017-06-17 ENCOUNTER — Other Ambulatory Visit (INDEPENDENT_AMBULATORY_CARE_PROVIDER_SITE_OTHER): Payer: Self-pay | Admitting: *Deleted

## 2017-06-17 ENCOUNTER — Ambulatory Visit: Payer: Medicare Other | Admitting: Physician Assistant

## 2017-06-17 VITALS — BP 104/60 | HR 89 | Wt 194.6 lb

## 2017-06-17 DIAGNOSIS — I2510 Atherosclerotic heart disease of native coronary artery without angina pectoris: Secondary | ICD-10-CM | POA: Diagnosis not present

## 2017-06-17 DIAGNOSIS — K51011 Ulcerative (chronic) pancolitis with rectal bleeding: Secondary | ICD-10-CM

## 2017-06-17 DIAGNOSIS — I5042 Chronic combined systolic (congestive) and diastolic (congestive) heart failure: Secondary | ICD-10-CM

## 2017-06-17 DIAGNOSIS — I482 Chronic atrial fibrillation, unspecified: Secondary | ICD-10-CM

## 2017-06-17 DIAGNOSIS — I429 Cardiomyopathy, unspecified: Secondary | ICD-10-CM | POA: Diagnosis not present

## 2017-06-17 DIAGNOSIS — I10 Essential (primary) hypertension: Secondary | ICD-10-CM

## 2017-06-17 MED ORDER — FUROSEMIDE 20 MG PO TABS: 40.0000 mg | ORAL_TABLET | Freq: Two times a day (BID) | ORAL | 3 refills | Status: DC

## 2017-06-17 MED ORDER — POTASSIUM CHLORIDE ER 10 MEQ PO TBCR: 20.0000 meq | EXTENDED_RELEASE_TABLET | Freq: Two times a day (BID) | ORAL | 3 refills | Status: DC

## 2017-06-17 MED ORDER — POTASSIUM CHLORIDE ER 10 MEQ PO TBCR: 10.0000 meq | EXTENDED_RELEASE_TABLET | Freq: Two times a day (BID) | ORAL | 3 refills | Status: DC

## 2017-06-17 NOTE — Progress Notes (Signed)
.  h 

## 2017-06-17 NOTE — Patient Instructions (Signed)
Medication Instructions:  Your physician has recommended you make the following change in your medication:  Decrease Lasix to 40 mg Two Times Daily  Decrease Potassium to 20 mg Two Times Daily    Labwork: Your physician recommends that you return for lab work in: 2 Weeks.    Testing/Procedures: NONE   Follow-Up: Your physician recommends that you schedule a follow-up appointment in: July with Bernerd Pho, PA-C    Any Other Special Instructions Will Be Listed Below (If Applicable). You have been referred to [facility]      If you need a refill on your cardiac medications before your next appointment, please call your pharmacy. Thank you for choosing [facility]!

## 2017-06-18 NOTE — Telephone Encounter (Signed)
Had OV with [name] Husk, PA in [place] yesterday.

## 2017-06-19 ENCOUNTER — Inpatient Hospital Stay (HOSPITAL_COMMUNITY): Admission: RE | Admit: 2017-06-19 | Payer: Medicare Other | Source: Ambulatory Visit

## 2017-06-20 DIAGNOSIS — K51011 Ulcerative (chronic) pancolitis with rectal bleeding: Secondary | ICD-10-CM | POA: Diagnosis not present

## 2017-06-20 LAB — HEMOGLOBIN AND HEMATOCRIT, BLOOD
HEMATOCRIT: 28.9 % — AB (ref 35.0–45.0)
Hemoglobin: 8.8 g/dL — ABNORMAL LOW (ref 11.7–15.5)

## 2017-06-21 ENCOUNTER — Other Ambulatory Visit (INDEPENDENT_AMBULATORY_CARE_PROVIDER_SITE_OTHER): Payer: Self-pay | Admitting: *Deleted

## 2017-06-21 DIAGNOSIS — K5100 Ulcerative (chronic) pancolitis without complications: Secondary | ICD-10-CM

## 2017-06-25 ENCOUNTER — Ambulatory Visit (HOSPITAL_COMMUNITY): Admission: RE | Admit: 2017-06-25 | Payer: Medicare Other | Source: Ambulatory Visit | Admitting: Internal Medicine

## 2017-06-25 ENCOUNTER — Encounter (HOSPITAL_COMMUNITY): Admission: RE | Payer: Self-pay | Source: Ambulatory Visit

## 2017-06-25 SURGERY — ESOPHAGOGASTRODUODENOSCOPY (EGD) WITH PROPOFOL
Anesthesia: Monitor Anesthesia Care

## 2017-07-04 DIAGNOSIS — I482 Chronic atrial fibrillation: Secondary | ICD-10-CM | POA: Diagnosis not present

## 2017-07-04 DIAGNOSIS — K5100 Ulcerative (chronic) pancolitis without complications: Secondary | ICD-10-CM | POA: Diagnosis not present

## 2017-07-04 DIAGNOSIS — I5042 Chronic combined systolic (congestive) and diastolic (congestive) heart failure: Secondary | ICD-10-CM | POA: Diagnosis not present

## 2017-07-04 LAB — HEMOGLOBIN AND HEMATOCRIT, BLOOD
HCT: 35.7 % (ref 35.0–45.0)
Hemoglobin: 10.7 g/dL — ABNORMAL LOW (ref 11.7–15.5)

## 2017-07-05 ENCOUNTER — Encounter (INDEPENDENT_AMBULATORY_CARE_PROVIDER_SITE_OTHER): Payer: Self-pay | Admitting: *Deleted

## 2017-07-05 ENCOUNTER — Other Ambulatory Visit (INDEPENDENT_AMBULATORY_CARE_PROVIDER_SITE_OTHER): Payer: Self-pay | Admitting: *Deleted

## 2017-07-05 DIAGNOSIS — D649 Anemia, unspecified: Secondary | ICD-10-CM

## 2017-07-06 LAB — BASIC METABOLIC PANEL
BUN / CREAT RATIO: 17 (calc) (ref 6–22)
BUN: 16 mg/dL (ref 7–25)
CHLORIDE: 100 mmol/L (ref 98–110)
CO2: 25 mmol/L (ref 20–32)
CREATININE: 0.95 mg/dL — AB (ref 0.60–0.88)
Calcium: 9.5 mg/dL (ref 8.6–10.4)
Glucose, Bld: 97 mg/dL (ref 65–139)
POTASSIUM: 4.7 mmol/L (ref 3.5–5.3)
SODIUM: 136 mmol/L (ref 135–146)

## 2017-07-06 LAB — AMIODARONE LEVEL
Amiodarone Lvl: 0.2 ug/mL — ABNORMAL LOW (ref 1.5–2.5)
Desethylamiodarone: 0.2 ug/mL — ABNORMAL LOW (ref 1.5–2.5)

## 2017-07-08 ENCOUNTER — Ambulatory Visit: Payer: Medicare Other | Admitting: Student

## 2017-07-09 ENCOUNTER — Telehealth: Payer: Self-pay | Admitting: Cardiology

## 2017-07-09 NOTE — Telephone Encounter (Signed)
Patient called stating that she ate a chicken breast yesterday around 7pm . Patient states that she weighed this am and has gain 2.6 lbs.  Was told if she wakes up and has gained over 2 lbs to call office.

## 2017-07-09 NOTE — Telephone Encounter (Signed)
Please call patient and see how she is doing (nursing).  She is on Lasix at baseline, could take an additional 20 mg tablet today.

## 2017-07-09 NOTE — Telephone Encounter (Signed)
Last pm c/o back hurting where kidneys may be at last evening.  Only hurt for a very short time & fine this morning.  No c/o chest pain or dizziness.  Is currently taking Lasix 23m twice a day.  She will take one additional Lasix today.  Stated that she know she should not have eaten the chicken last pm

## 2017-07-15 ENCOUNTER — Ambulatory Visit: Payer: Medicare Other | Admitting: Cardiology

## 2017-07-15 ENCOUNTER — Telehealth: Payer: Self-pay | Admitting: Cardiology

## 2017-07-15 ENCOUNTER — Telehealth: Payer: Self-pay | Admitting: Pharmacist

## 2017-07-15 ENCOUNTER — Encounter

## 2017-07-15 NOTE — Telephone Encounter (Signed)
Follow Up: ° ° ° ° ° °Returning your call from Friday. °

## 2017-07-15 NOTE — Telephone Encounter (Addendum)
Pt understands AFib clinic will contact her to arrange Tikosyn adx. Her dtr will be bringing her and can do it on 7/15 or 7/29. Will forward to Afib clinic to contact pt to arrange.   (Pt was on Amiodarone -- level is now low enough to initiate Tikosyn.  She has already checked cost and can afford it.  PharmD has reviewed chart.  Pt recent hospitalization last month and had to stop Xarelto for endoscopy procedure (bleeding ulcer). She restarted it around 6/11 or 6/12. Confirms that she hasn't missed any doses since then)

## 2017-07-15 NOTE — Telephone Encounter (Signed)
Medication list reviewed in anticipation of upcoming Tikosyn initiation. Patient is not taking any contraindicated or QTc prolonging medications. Amio level now less than 0.55mg/ml and ok to proceed with Tikosyn initiation.   Patient is anticoagulated on Xarelto on the appropriate dose; however, I do see that Xarelto was held at the beginning of June (until 6/11 due to GI bleed). This week would be 4 weeks post resumption of Xarelto per our records. Please ensure that patient has not missed any anticoagulation doses in the 3 weeks prior to Tikosyn initiation.   Patient will need to be counseled to avoid use of Benadryl while on Tikosyn and in the 2-3 days prior to Tikosyn initiation.

## 2017-07-15 NOTE — Telephone Encounter (Signed)
[name], [facility]    07/15/17 10:29 AM  Note    Medication list reviewed in anticipation of upcoming Tikosyn initiation. Patient is not taking any contraindicated or QTc prolonging medications. Amio level now less than 0.97mg/ml and ok to proceed with Tikosyn initiation.   Patient is anticoagulated on Xarelto on the appropriate dose; however, I do see that Xarelto was held at the beginning of June (until 6/11 due to GI bleed). This week would be 4 weeks post resumption of Xarelto per our records. Please ensure that patient has not missed any anticoagulation doses in the 3 weeks prior to Tikosyn initiation.   Patient will need to be counseled to avoid use of Benadryl while on Tikosyn and in the 2-3 days prior to Tikosyn initiation.

## 2017-07-15 NOTE — Telephone Encounter (Signed)
tikosyn admit scheduled for 7/29

## 2017-07-19 ENCOUNTER — Encounter: Payer: Self-pay | Admitting: Physician Assistant

## 2017-07-19 NOTE — Progress Notes (Signed)
Cardiology Office Note    Date:  07/23/2017  ID:  [name], DOB [birth year], MRN [id number] PCP:  Rory Percy, MD  Cardiologist:  Rozann Lesches, MD  Chief Complaint: f/u afib  History of Present Illness:  [name] is a 84 y.o. female with history of CAD (s/p BMS to RCA in 2014, patent stent by cath in 04/2017), persistent atrial fibrillation, non-ischemic cardiomyopathy (EF 25% by echo in 04/2017), ulcerative colitis, macular degeneration, bladder cancer, depression, HTN, NSVICD (LBBB morphology) and HLD who presents for post-hospital follow-up.  She has been followed recently for her worsening dyspnea in the setting of recurrent atrial fib and worsening cardiomyopathy (EF 35% in 12/2016, down to 25% in 04/2017). She underwent DCCV earlier this year that didn't hold. 2D echo 04/25/17 showed EF 25% with diffuse HK, akinesis of the anteroseptal myocardium, akinesis of basal mid inferior myocardium, moderate MR, severely dilated LA, mildly reduced RV function, mild TR. She underwent a repeat cardiac catheterization in 04/2017 which showed minimal CAD and a patent RCA stent with only mild MR. She was referred to Dr. Curt Bears for further medical management of her atrial fibrillation and in the setting of her age and chronic medical conditions, she was not felt to be a good candidate for ablation. Tikosyn loading was planned but in 05/2017 but her amiodarone level was higher than anticipated, therefore it was recommended to put off to a later date after all procedures including planned endoscopy were completed. She had reported melena and weight loss since April in setting of poor appetite (220->194). Before this was performed, she was admitted from PCP's office 06/12/17 with GI bleed and acute anemia with Hgb of 6.8 requiring blood transfusion. She underwent EGD which was unremarkable and capsule study where GI bleeding was felt likely due to small bowel ulcer and discontinuation of aspirin  was recommended along with resuming anticoagulant in 3 days. No specific other recs were discussed, except that she would need further eval if bleeding were to recur. Repeat Hgb 6/27 was 10.7, Cr 0.95, K 4.7, amio level down to 0.2, earlier in 06/2017 - albumin 3.4, TBili 3.2, 04/2017 TSH elevated at 9.  She returns for follow-up back with her daughter. She's put on about 4lb in the last few weeks as her appetite has been steadily improving. Her dyspnea on exertion persists, unchanged, and is a major source of frustration as she'd like to be more active again. Mild ankle edema noted. She sleeps on a couch for comfort but it's unclear if this is for orthopedic issues or orthopnea. No PND reported. BP tends to run on softer side and she is relatively asymptomatic with this. Has f/u coming up with urology for recheck bladder CA and GI as well. She is remarkably astute in addition to being well informed and engaged in her health.   Past Medical History:  Diagnosis Date  . Acute blood loss anemia 06/2017  . Arthritis   . Atrial flutter ([place])   . Bladder cancer ([place])   . Coronary atherosclerosis of native coronary artery    a. BMS RCA May 2014 - [place] stent. b. Cath 04/2017 - patent stent, minimal CAD otherwise.  . Depression   . Essential hypertension, benign   . GI bleeding 06/2017   a. melena/small bowel ulcer by capsule endo 06/2017.  Marland Kitchen Hyperlipemia   . Macular degeneration   . NICM (nonischemic cardiomyopathy) ([place])    a. EF fluctuating over the years in the 30s,40s, but  most recently 25% in 04/2017.  Marland Kitchen Persistent atrial fibrillation ([place])   . Ulcerative colitis     Past Surgical History:  Procedure Laterality Date  . ABDOMINAL HYSTERECTOMY    . APPENDECTOMY    . Bilateral knee replacements      2007, 2008  . BLADDER SURGERY    . CARDIOVERSION N/A 02/04/2017   Procedure: CARDIOVERSION;  Surgeon: Arnoldo Lenis, MD;  [facility];  Service: Endoscopy;  Laterality: N/A;  .  CARDIOVERSION N/A 02/26/2017   Procedure: CARDIOVERSION;  Surgeon: Satira Sark, MD;  Location: [facility];  Service: Cardiovascular;  Laterality: N/A;  . COLONOSCOPY N/A 06/15/2015   Procedure: COLONOSCOPY;  Surgeon: Rogene Houston, MD;  [facility];  Service: Endoscopy;  Laterality: N/A;  210  . CYSTOSCOPY W/ RETROGRADES Bilateral 05/14/2016   Procedure: CYSTOSCOPY WITH BILATERAL RETROGRADE PYELOGRAM;  Surgeon: Raynelle Bring, MD;  Location: [facility];  Service: Urology;  Laterality: Bilateral;  GENERAL ANESTHESIA WITH PARALYSIS  . ESOPHAGOGASTRODUODENOSCOPY (EGD) WITH PROPOFOL N/A 06/14/2017   Procedure: ESOPHAGOGASTRODUODENOSCOPY (EGD) WITH PROPOFOL;  Surgeon: Rogene Houston, MD;  [facility];  Service: Endoscopy;  Laterality: N/A;  . GIVENS CAPSULE STUDY  06/14/2017   Procedure: GIVENS CAPSULE STUDY;  Surgeon: Rogene Houston, MD;  [facility];  Service: Endoscopy;;  . LEFT HEART CATHETERIZATION WITH CORONARY ANGIOGRAM N/A 10/30/2013   Procedure: LEFT HEART CATHETERIZATION WITH CORONARY ANGIOGRAM;  Surgeon: Burnell Blanks, MD;  Location: [facility] CATH LAB;  Service: Cardiovascular;  Laterality: N/A;  . RIGHT/LEFT HEART CATH AND CORONARY ANGIOGRAPHY N/A 05/03/2017   Procedure: RIGHT/LEFT HEART CATH AND CORONARY ANGIOGRAPHY;  Surgeon: Leonie Man, MD;  Location: [place] CV LAB;  Service: Cardiovascular;  Laterality: N/A;  . TEE WITHOUT CARDIOVERSION N/A 02/04/2017   Procedure: TRANSESOPHAGEAL ECHOCARDIOGRAM (TEE) WITH PROPOL;  Surgeon: Arnoldo Lenis, MD;  [facility];  Service: Endoscopy;  Laterality: N/A;  . TONSILLECTOMY    . TOTAL KNEE ARTHROPLASTY    . TRANSURETHRAL RESECTION OF BLADDER TUMOR N/A 05/14/2016   Procedure: TRANSURETHRAL RESECTION OF BLADDER TUMOR (TURBT);  Surgeon: Raynelle Bring, MD;  Location: [facility];  Service: Urology;  Laterality: N/A;  GENERAL ANESTHESIA WITH PARALYSIS  . YAG LASER APPLICATION Bilateral 02/14/2534    Procedure: YAG LASER APPLICATION;  Surgeon: Williams Che, MD;  Location: [facility];  Service: Ophthalmology;  Laterality: Bilateral;    Current Medications: Current Meds  Medication Sig  . acetaminophen (TYLENOL) 500 MG tablet Take 1,000 mg by mouth every 6 (six) hours as needed.  Marland Kitchen atorvastatin (LIPITOR) 20 MG tablet TAKE ONE TABLET BY MOUTH AT BEDTIME  . balsalazide (COLAZAL) 750 MG capsule TAKE THREE CAPSULES BY MOUTH THREE TIMES DAILY (REPLACING DELZICOL)  . furosemide (LASIX) 20 MG tablet Take 2 tablets (40 mg total) by mouth 2 (two) times daily.  Marland Kitchen ketotifen (THERATEARS ALLERGY) 0.025 % ophthalmic solution Place 2 drops into both eyes 3 (three) times daily as needed (for dry eyes).   Marland Kitchen lisinopril (PRINIVIL,ZESTRIL) 5 MG tablet Take 1 tablet (5 mg total) by mouth daily.  . metoprolol succinate (TOPROL-XL) 50 MG 24 hr tablet TAKE 1 TABLET BY MOUTH TWICE DAILY with OR immediately following a meal  . Multiple Vitamins-Minerals (ICAPS AREDS 2 PO) Take 2 tablets by mouth daily at 6 PM.   . potassium chloride (K-DUR) 10 MEQ tablet Take 2 tablets (20 mEq total) by mouth 2 (two) times daily.  . rivaroxaban (XARELTO) 20  MG TABS tablet Take 1 tablet (20 mg total) by mouth daily with supper.    Allergies:   Sinus & allergy [chlorpheniramine-phenylephrine]; Demerol; and Penicillins   Social History   Socioeconomic History  . Marital status: Married    Spouse name: Not on file  . Number of children: Not on file  . Years of education: Not on file  . Highest education level: Not on file  Occupational History  . Not on file  Social Needs  . Financial resource strain: Not on file  . Food insecurity:    Worry: Not on file    Inability: Not on file  . Transportation needs:    Medical: Not on file    Non-medical: Not on file  Tobacco Use  . Smoking status: Never Smoker  . Smokeless tobacco: Never Used  Substance and Sexual Activity  . Alcohol use: No    Alcohol/week: 0.0 oz  . Drug use:  No  . Sexual activity: Not Currently  Lifestyle  . Physical activity:    Days per week: Not on file    Minutes per session: Not on file  . Stress: Not on file  Relationships  . Social connections:    Talks on phone: Not on file    Gets together: Not on file    Attends religious service: Not on file    Active member of club or organization: Not on file    Attends meetings of clubs or organizations: Not on file    Relationship status: Not on file  Other Topics Concern  . Not on file  Social History Narrative  . Not on file     Family History:  The patient's *family history includes CAD in her father.  ROS:   Please see the history of present illness. All other systems are reviewed and otherwise negative.    PHYSICAL EXAM:   VS:  BP 100/60   Pulse 86   Ht 5' 3"  (1.6 m)   Wt 197 lb (89.4 kg)   SpO2 98%   BMI 34.90 kg/m   BMI: Body mass index is 34.9 kg/m. GEN: Well nourished, well developed WF, in no acute distress HEENT: normocephalic, atraumatic Neck: no JVD, carotid bruits, or masses Cardiac: Irregularly irregular; no murmurs, rubs, or gallops, trace BLE edema superimposed on larger leg habitus (soft, non pitting) Respiratory:  clear to auscultation bilaterally, normal work of breathing GI: soft, nontender, nondistended, + BS MS: no deformity or atrophy Skin: warm and dry, no rash Neuro:  Alert and Oriented x 3, Strength and sensation are intact, follows commands Psych: euthymic mood, full affect  Wt Readings from Last 3 Encounters:  07/23/17 197 lb (89.4 kg)  06/17/17 194 lb 9.6 oz (88.3 kg)  06/15/17 193 lb 9.6 oz (87.8 kg)      Studies/Labs Reviewed:   EKG:  EKG was not ordered today.  Recent Labs: 02/01/2017: TSH 1.783 02/24/2017: B Natriuretic Peptide 308.0 02/26/2017: Magnesium 1.8 06/14/2017: ALT 8 06/15/2017: Platelets 419 07/04/2017: BUN 16; Creat 0.95; Hemoglobin 10.7; Potassium 4.7; Sodium 136   Lipid Panel No results found for: CHOL, TRIG, HDL,  CHOLHDL, VLDL, LDLCALC, LDLDIRECT  Additional studies/ records that were reviewed today include: Summarized above.    ASSESSMENT & PLAN:   1. Persistent atrial fibrillation - she will be seeing Roderic Palau later this month to discuss going forward with Tikosyn. CrCl is 70.76 ml/min so Xarelto dose remains appropriate. Will check potassium, magnesium, and renal function in anticipation of "teeing  her up" from an electrolyte standpoint before her initiation later this month. Will also f/u Hgb today given recent GIB and repeat TSH with free T4 given abnormal thyroid function earlier this year. They inquired about Watchman (someone at [facility] mentioned it) and we discussed that this would not necessarily affect her rhythm, as it only pertains to anticoagulation. If Tikosyn is unsuccessful, I wonder if perhaps EP would entertain the idea of AVN ablation with CRT placement given low EF and LBBB-morphology NSIVCD. 2. Nonischemic cardiomyopathy - clinically does not appear volume overloaded but given dyspnea will obtain baseline BNP. She is NYHA class III. I suspect she just does not tolerate AF well, hence the plan above. BP is too low for further advancement of HF regimen. Reviewed 2g sodium restriction, 2L fluid restriction, daily weights with patient.  3. Mitral regurgitation - mild by cath. Follow clinically. 4. CAD - stable by cath 04/2017. No longer on ASA given small bowel ulcer. 5. Anemia unspecified - f/u CBC today . 6. Abnormal LFTs - elevated Tbili noted in the hospital recently - will f/u today to trend.  Disposition: F/u with Roderic Palau as scheduled 08/05/17.  Medication Adjustments/Labs and Tests Ordered: Current medicines are reviewed at length with the patient today.  Concerns regarding medicines are outlined above. Medication changes, Labs and Tests ordered today are summarized above and listed in the Patient Instructions accessible in Encounters.   Signed, Charlie Pitter, PA-C    07/23/2017 1:03 PM    [facility] [place] Location in [place], [place] [address] Ph: [phone]; Fax [phone]

## 2017-07-22 ENCOUNTER — Telehealth: Payer: Self-pay | Admitting: Cardiology

## 2017-07-23 ENCOUNTER — Encounter: Payer: Self-pay | Admitting: Physician Assistant

## 2017-07-23 ENCOUNTER — Other Ambulatory Visit (HOSPITAL_COMMUNITY)
Admission: RE | Admit: 2017-07-23 | Discharge: 2017-07-23 | Disposition: A | Discharge status: Home / Self Care | Payer: Medicare Other | Source: Ambulatory Visit | Attending: Physician Assistant | Admitting: Physician Assistant

## 2017-07-23 ENCOUNTER — Ambulatory Visit: Payer: Medicare Other | Admitting: Physician Assistant

## 2017-07-23 VITALS — BP 100/60 | HR 86 | Ht 63.0 in | Wt 197.0 lb

## 2017-07-23 DIAGNOSIS — I481 Persistent atrial fibrillation: Secondary | ICD-10-CM

## 2017-07-23 DIAGNOSIS — I4819 Other persistent atrial fibrillation: Secondary | ICD-10-CM

## 2017-07-23 DIAGNOSIS — I2510 Atherosclerotic heart disease of native coronary artery without angina pectoris: Secondary | ICD-10-CM | POA: Diagnosis not present

## 2017-07-23 DIAGNOSIS — D649 Anemia, unspecified: Secondary | ICD-10-CM | POA: Insufficient documentation

## 2017-07-23 DIAGNOSIS — R0609 Other forms of dyspnea: Secondary | ICD-10-CM | POA: Insufficient documentation

## 2017-07-23 DIAGNOSIS — R0600 Dyspnea, unspecified: Secondary | ICD-10-CM

## 2017-07-23 DIAGNOSIS — I340 Nonrheumatic mitral (valve) insufficiency: Secondary | ICD-10-CM

## 2017-07-23 DIAGNOSIS — R945 Abnormal results of liver function studies: Secondary | ICD-10-CM

## 2017-07-23 DIAGNOSIS — I428 Other cardiomyopathies: Secondary | ICD-10-CM | POA: Diagnosis not present

## 2017-07-23 DIAGNOSIS — R7989 Other specified abnormal findings of blood chemistry: Secondary | ICD-10-CM

## 2017-07-23 LAB — CBC WITH DIFFERENTIAL/PLATELET
BASOS ABS: 0 10*3/uL (ref 0.0–0.1)
BASOS PCT: 0 %
Eosinophils Absolute: 0.1 10*3/uL (ref 0.0–0.7)
Eosinophils Relative: 1 %
HCT: 37.4 % (ref 36.0–46.0)
HEMOGLOBIN: 11.7 g/dL — AB (ref 12.0–15.0)
Lymphocytes Relative: 31 %
Lymphs Abs: 2.2 10*3/uL (ref 0.7–4.0)
MCH: 25.4 pg — AB (ref 26.0–34.0)
MCHC: 31.3 g/dL (ref 30.0–36.0)
MCV: 81.3 fL (ref 78.0–100.0)
MONOS PCT: 9 %
Monocytes Absolute: 0.6 10*3/uL (ref 0.1–1.0)
NEUTROS ABS: 4.1 10*3/uL (ref 1.7–7.7)
NEUTROS PCT: 59 %
Platelets: 284 10*3/uL (ref 150–400)
RBC: 4.6 MIL/uL (ref 3.87–5.11)
RDW: 25.8 % — AB (ref 11.5–15.5)
WBC: 7 10*3/uL (ref 4.0–10.5)

## 2017-07-23 LAB — COMPREHENSIVE METABOLIC PANEL
ALBUMIN: 3.6 g/dL (ref 3.5–5.0)
ALT: 12 U/L (ref 0–44)
ANION GAP: 10 (ref 5–15)
AST: 22 U/L (ref 15–41)
Alkaline Phosphatase: 56 U/L (ref 38–126)
BUN: 18 mg/dL (ref 8–23)
CHLORIDE: 105 mmol/L (ref 98–111)
CO2: 23 mmol/L (ref 22–32)
Calcium: 9.1 mg/dL (ref 8.9–10.3)
Creatinine, Ser: 0.91 mg/dL (ref 0.44–1.00)
GFR calc Af Amer: >60 mL/min (ref >60)
GFR, EST NON AFRICAN AMERICAN: 56 mL/min — AB (ref >60)
Glucose, Bld: 106 mg/dL — ABNORMAL HIGH (ref 70–99)
Potassium: 3.9 mmol/L (ref 3.5–5.1)
Sodium: 138 mmol/L (ref 135–145)
Total Bilirubin: 1.6 mg/dL — ABNORMAL HIGH (ref 0.3–1.2)
Total Protein: 6.9 g/dL (ref 6.5–8.1)

## 2017-07-23 LAB — BRAIN NATRIURETIC PEPTIDE: B NATRIURETIC PEPTIDE 5: 1008 pg/mL — AB (ref 0.0–100.0)

## 2017-07-23 LAB — TSH: TSH: 3.932 u[IU]/mL (ref 0.350–4.500)

## 2017-07-23 LAB — T4, FREE: FREE T4: 1.33 ng/dL (ref 0.82–1.77)

## 2017-07-23 LAB — MAGNESIUM: Magnesium: 2.2 mg/dL (ref 1.7–2.4)

## 2017-07-23 NOTE — Patient Instructions (Addendum)
For patients with congestive heart failure, we give them these special instructions:  1. Follow a low-salt diet - you are allowed no more than 2,064m of sodium per day. Watch your fluid intake. In general, you should not be taking in more than 2 liters of fluid per day (no more than 8 glasses per day). This includes sources of water in foods like soup, coffee, tea, milk, etc. 2. Weigh yourself on the same scale at same time of day and keep a log. 3. Call your doctor: (Anytime you feel any of the following symptoms)  - 3lb weight gain overnight or 5lb within a few days - Shortness of breath, with or without a dry hacking cough  - Swelling in the hands, feet or stomach  - If you have to sleep on extra pillows at night in order to breathe   IT IS IMPORTANT TO LET YOUR DOCTOR KNOW EARLY ON IF YOU ARE HAVING SYMPTOMS SO WE CAN HELP YOU!  Medication Instructions:  Your physician recommends that you continue on your current medications as directed. Please refer to the Current Medication list given to you today.   Labwork: Your physician recommends that you return for lab work today   Testing/Procedures: NONE   Follow-Up: Your physician recommends that you schedule a follow-up appointment with [name]  Any Other Special Instructions Will Be Listed Below (If Applicable).     If you need a refill on your cardiac medications before your next appointment, please call your pharmacy.   Thank you for choosing [facility]

## 2017-07-24 NOTE — Telephone Encounter (Signed)
-----   Message from Charlie Pitter, [place] sent at 07/24/2017  3:32 PM EDT ----- Sent this yesterday but do not see call placed yet - just reforwarding in case something happened. Thanks!

## 2017-07-24 NOTE — Telephone Encounter (Signed)
Pt made aware of lab results. Pt stated that the 29th was a good date for her as her daughter has plans to go out of town next week. (She may be willing to reschedule- just depending on date) I sent Lorenda Hatchet a staff message trying to figure out what other dates were available. Pt states she has not missed a dose of xarelto since starting on it in hospital.

## 2017-08-05 ENCOUNTER — Inpatient Hospital Stay (HOSPITAL_COMMUNITY)
Admission: RE | Admit: 2017-08-05 | Discharge: 2017-08-08 | DRG: 309 | Disposition: A | Discharge status: Home / Self Care | Payer: Medicare Other | Source: Ambulatory Visit | Attending: Cardiology | Admitting: Cardiology

## 2017-08-05 ENCOUNTER — Other Ambulatory Visit: Payer: Self-pay

## 2017-08-05 ENCOUNTER — Encounter (HOSPITAL_COMMUNITY): Payer: Self-pay | Admitting: Nurse Practitioner

## 2017-08-05 ENCOUNTER — Ambulatory Visit (HOSPITAL_COMMUNITY)
Admission: RE | Admit: 2017-08-05 | Discharge: 2017-08-05 | Disposition: A | Discharge status: Home / Self Care | Payer: Medicare Other | Source: Ambulatory Visit | Attending: Nurse Practitioner | Admitting: Nurse Practitioner

## 2017-08-05 ENCOUNTER — Encounter (HOSPITAL_COMMUNITY): Payer: Self-pay | Admitting: *Deleted

## 2017-08-05 VITALS — BP 102/68 | HR 99 | Ht 63.0 in | Wt 196.0 lb

## 2017-08-05 DIAGNOSIS — Z888 Allergy status to other drugs, medicaments and biological substances status: Secondary | ICD-10-CM

## 2017-08-05 DIAGNOSIS — I472 Ventricular tachycardia: Secondary | ICD-10-CM | POA: Diagnosis not present

## 2017-08-05 DIAGNOSIS — I5022 Chronic systolic (congestive) heart failure: Secondary | ICD-10-CM | POA: Diagnosis present

## 2017-08-05 DIAGNOSIS — Z8249 Family history of ischemic heart disease and other diseases of the circulatory system: Secondary | ICD-10-CM | POA: Diagnosis not present

## 2017-08-05 DIAGNOSIS — I509 Heart failure, unspecified: Secondary | ICD-10-CM | POA: Diagnosis not present

## 2017-08-05 DIAGNOSIS — I340 Nonrheumatic mitral (valve) insufficiency: Secondary | ICD-10-CM | POA: Diagnosis present

## 2017-08-05 DIAGNOSIS — Z5181 Encounter for therapeutic drug level monitoring: Secondary | ICD-10-CM

## 2017-08-05 DIAGNOSIS — I4892 Unspecified atrial flutter: Secondary | ICD-10-CM | POA: Diagnosis present

## 2017-08-05 DIAGNOSIS — Z96653 Presence of artificial knee joint, bilateral: Secondary | ICD-10-CM | POA: Diagnosis present

## 2017-08-05 DIAGNOSIS — K5190 Ulcerative colitis, unspecified, without complications: Secondary | ICD-10-CM | POA: Diagnosis not present

## 2017-08-05 DIAGNOSIS — Z8719 Personal history of other diseases of the digestive system: Secondary | ICD-10-CM | POA: Diagnosis not present

## 2017-08-05 DIAGNOSIS — I471 Supraventricular tachycardia: Secondary | ICD-10-CM | POA: Diagnosis not present

## 2017-08-05 DIAGNOSIS — I483 Typical atrial flutter: Secondary | ICD-10-CM | POA: Diagnosis not present

## 2017-08-05 DIAGNOSIS — I447 Left bundle-branch block, unspecified: Secondary | ICD-10-CM | POA: Diagnosis not present

## 2017-08-05 DIAGNOSIS — Z7901 Long term (current) use of anticoagulants: Secondary | ICD-10-CM

## 2017-08-05 DIAGNOSIS — Z8551 Personal history of malignant neoplasm of bladder: Secondary | ICD-10-CM

## 2017-08-05 DIAGNOSIS — I481 Persistent atrial fibrillation: Secondary | ICD-10-CM | POA: Diagnosis not present

## 2017-08-05 DIAGNOSIS — Z885 Allergy status to narcotic agent status: Secondary | ICD-10-CM

## 2017-08-05 DIAGNOSIS — I440 Atrioventricular block, first degree: Secondary | ICD-10-CM | POA: Diagnosis not present

## 2017-08-05 DIAGNOSIS — I491 Atrial premature depolarization: Secondary | ICD-10-CM | POA: Diagnosis not present

## 2017-08-05 DIAGNOSIS — Z955 Presence of coronary angioplasty implant and graft: Secondary | ICD-10-CM

## 2017-08-05 DIAGNOSIS — Z906 Acquired absence of other parts of urinary tract: Secondary | ICD-10-CM

## 2017-08-05 DIAGNOSIS — Z79899 Other long term (current) drug therapy: Secondary | ICD-10-CM

## 2017-08-05 DIAGNOSIS — Z880 Allergy status to penicillin: Secondary | ICD-10-CM

## 2017-08-05 DIAGNOSIS — E785 Hyperlipidemia, unspecified: Secondary | ICD-10-CM | POA: Diagnosis present

## 2017-08-05 DIAGNOSIS — I428 Other cardiomyopathies: Secondary | ICD-10-CM | POA: Diagnosis not present

## 2017-08-05 DIAGNOSIS — H3530 Unspecified macular degeneration: Secondary | ICD-10-CM | POA: Diagnosis present

## 2017-08-05 DIAGNOSIS — I110 Hypertensive heart disease with heart failure: Secondary | ICD-10-CM | POA: Diagnosis not present

## 2017-08-05 DIAGNOSIS — Z90710 Acquired absence of both cervix and uterus: Secondary | ICD-10-CM

## 2017-08-05 DIAGNOSIS — I2510 Atherosclerotic heart disease of native coronary artery without angina pectoris: Secondary | ICD-10-CM | POA: Diagnosis not present

## 2017-08-05 DIAGNOSIS — I4891 Unspecified atrial fibrillation: Secondary | ICD-10-CM | POA: Diagnosis not present

## 2017-08-05 DIAGNOSIS — I4819 Other persistent atrial fibrillation: Secondary | ICD-10-CM

## 2017-08-05 DIAGNOSIS — Z9089 Acquired absence of other organs: Secondary | ICD-10-CM | POA: Diagnosis not present

## 2017-08-05 LAB — MAGNESIUM: Magnesium: 2.1 mg/dL (ref 1.7–2.4)

## 2017-08-05 LAB — BASIC METABOLIC PANEL
ANION GAP: 11 (ref 5–15)
BUN: 21 mg/dL (ref 8–23)
CALCIUM: 9.4 mg/dL (ref 8.9–10.3)
CO2: 23 mmol/L (ref 22–32)
Chloride: 104 mmol/L (ref 98–111)
Creatinine, Ser: 1.02 mg/dL — ABNORMAL HIGH (ref 0.44–1.00)
GFR calc Af Amer: 56 mL/min — ABNORMAL LOW (ref >60)
GFR calc non Af Amer: 49 mL/min — ABNORMAL LOW (ref >60)
GLUCOSE: 119 mg/dL — AB (ref 70–99)
Potassium: 4 mmol/L (ref 3.5–5.1)
Sodium: 138 mmol/L (ref 135–145)

## 2017-08-05 MED ORDER — BALSALAZIDE DISODIUM 750 MG PO CAPS
750.0000 mg | ORAL_CAPSULE | Freq: Three times a day (TID) | ORAL | Status: DC
  Administered 2017-08-05 – 2017-08-08 (×7): 750 mg via ORAL
  Filled 2017-08-05 (×14): qty 1

## 2017-08-05 MED ORDER — DOFETILIDE 250 MCG PO CAPS: 250.0000 ug | ORAL_CAPSULE | Freq: Two times a day (BID) | ORAL | Status: DC

## 2017-08-05 MED ORDER — METOPROLOL SUCCINATE ER 50 MG PO TB24
50.0000 mg | ORAL_TABLET | Freq: Every day | ORAL | Status: DC
  Administered 2017-08-05 – 2017-08-07 (×3): 50 mg via ORAL
  Filled 2017-08-05 (×3): qty 1

## 2017-08-05 MED ORDER — FUROSEMIDE 40 MG PO TABS
40.0000 mg | ORAL_TABLET | Freq: Two times a day (BID) | ORAL | Status: DC
  Administered 2017-08-05: 40 mg via ORAL
  Filled 2017-08-05: qty 1

## 2017-08-05 MED ORDER — KETOTIFEN FUMARATE 0.025 % OP SOLN
2.0000 [drp] | Freq: Two times a day (BID) | OPHTHALMIC | Status: DC
  Administered 2017-08-05 – 2017-08-08 (×8): 2 [drp] via OPHTHALMIC
  Filled 2017-08-05: qty 5

## 2017-08-05 MED ORDER — SODIUM CHLORIDE 0.9% FLUSH: 3.0000 mL | INTRAVENOUS | Status: DC | PRN

## 2017-08-05 MED ORDER — SODIUM CHLORIDE 0.9% FLUSH
3.0000 mL | Freq: Two times a day (BID) | INTRAVENOUS | Status: DC
  Administered 2017-08-05 – 2017-08-06 (×3): 3 mL via INTRAVENOUS

## 2017-08-05 MED ORDER — PRESERVISION AREDS 2 PO CAPS
2.0000 | ORAL_CAPSULE | Freq: Every day | ORAL | Status: DC
  Filled 2017-08-05: qty 2

## 2017-08-05 MED ORDER — SODIUM CHLORIDE 0.9 % IV SOLN
250.0000 mL | INTRAVENOUS | Status: DC | PRN
  Administered 2017-08-07: 11:00:00 via INTRAVENOUS

## 2017-08-05 MED ORDER — KETOTIFEN FUMARATE 0.025 % OP SOLN
2.0000 [drp] | Freq: Three times a day (TID) | OPHTHALMIC | Status: DC | PRN
  Filled 2017-08-05: qty 5

## 2017-08-05 MED ORDER — RIVAROXABAN 15 MG PO TABS: 15.0000 mg | ORAL_TABLET | Freq: Every day | ORAL | Status: DC

## 2017-08-05 MED ORDER — NITROGLYCERIN 0.4 MG SL SUBL: 0.4000 mg | SUBLINGUAL_TABLET | SUBLINGUAL | Status: DC | PRN

## 2017-08-05 MED ORDER — DOFETILIDE 250 MCG PO CAPS
250.0000 ug | ORAL_CAPSULE | Freq: Two times a day (BID) | ORAL | Status: DC
  Administered 2017-08-05 – 2017-08-08 (×6): 250 ug via ORAL
  Filled 2017-08-05: qty 1
  Filled 2017-08-05: qty 14
  Filled 2017-08-05 (×5): qty 1

## 2017-08-05 MED ORDER — ACETAMINOPHEN 500 MG PO TABS: 500.0000 mg | ORAL_TABLET | Freq: Four times a day (QID) | ORAL | Status: DC | PRN

## 2017-08-05 MED ORDER — POTASSIUM CHLORIDE CRYS ER 10 MEQ PO TBCR
20.0000 meq | EXTENDED_RELEASE_TABLET | Freq: Three times a day (TID) | ORAL | Status: DC
  Administered 2017-08-05 – 2017-08-08 (×9): 20 meq via ORAL
  Filled 2017-08-05 (×12): qty 2

## 2017-08-05 MED ORDER — ENSURE ENLIVE PO LIQD: 237.0000 mL | Freq: Two times a day (BID) | ORAL | Status: DC

## 2017-08-05 MED ORDER — ATORVASTATIN CALCIUM 20 MG PO TABS
20.0000 mg | ORAL_TABLET | Freq: Every day | ORAL | Status: DC
  Administered 2017-08-05 – 2017-08-07 (×3): 20 mg via ORAL
  Filled 2017-08-05 (×3): qty 1

## 2017-08-05 MED ORDER — LISINOPRIL 5 MG PO TABS
5.0000 mg | ORAL_TABLET | Freq: Every day | ORAL | Status: DC
  Administered 2017-08-05 – 2017-08-08 (×4): 5 mg via ORAL
  Filled 2017-08-05 (×4): qty 1

## 2017-08-05 NOTE — H&P (Addendum)
Cardiology Admission History and Physical:   Patient ID: [name]; MRN: [id number]; DOB: [birth year]   Admission date: 08/05/2017  Primary Care Provider: Rory Percy, MD Primary Cardiologist: Rozann Lesches, MD  Primary Electrophysiologist:  Dr. Curt Bears  Chief Complaint:  Tikosyn initiation  Patient Profile:   [name] is a 84 y.o. female with a history of CAD (s/p BMS to RCA in 2014, patent stent by cath in 04/2017), UC, macular degeneration, bladder ca, depression, HTN, anemia (s/p underwent EGD June 2019 which was unremarkable and capsule study where GI bleeding was felt likely due to small bowel ulcer and discontinuation of aspirin was recommended along with resuming anticoagulant in 3 days, NICM.  History of Present Illness:   [name] was referred to Dr. Curt Bears for further medical management of her atrial fibrillation and in the setting of her age and chronic medical conditions, she was not felt to be a good candidate for ablation, and planned for Tikosyn intiation when amiodarone level allowed.  Ultimately referred to the AFib clinic for arrangements and plans this admission.  She was seen today in the AFib clinic, electrolytes are good, note reports that her EKG was reviewed with Dr. Curt Bears and QT felt acceptable to start load.  Her med list was reviewed by [facility] [place] [facility] noting no contraindicated medicines.  The patient confirmed no recent Benadryl use, reported compliance with her Xarelto, no missed doses in the last 3 weeks, and her last amiodarone level was <0.3  The patient is hoping SR will give her back her energy, we discussed that she has a few reasons for reduction in exertional capacity/tolerance, though if we are able to restore SR she may have some improvement, though likely only plays a part in her overall exertional intolerances  She feels well outside of this no CP, no recent illness/fever. She confirms no benadryl, no missed doses of  Xarelto in the last 3 weeks.  We discussed Tikosyn protocol, EKGs, potential benefits as well as risks of Tikosyn, and plans for DCCV Wed if not in SR, she is agreeable to proceed as planned.   Past Medical History:  Diagnosis Date  . Acute blood loss anemia 06/2017  . Arthritis   . Atrial flutter ([place])   . Bladder cancer ([place])   . Coronary atherosclerosis of native coronary artery    a. BMS RCA May 2014 - [place] stent. b. Cath 04/2017 - patent stent, minimal CAD otherwise.  . Depression   . Essential hypertension, benign   . GI bleeding 06/2017   a. melena/small bowel ulcer by capsule endo 06/2017.  Marland Kitchen Hyperlipemia   . Macular degeneration   . NICM (nonischemic cardiomyopathy) ([place])    a. EF fluctuating over the years in the 30s,40s, but most recently 25% in 04/2017.  Marland Kitchen Persistent atrial fibrillation ([place])   . Ulcerative colitis     Past Surgical History:  Procedure Laterality Date  . ABDOMINAL HYSTERECTOMY    . APPENDECTOMY    . Bilateral knee replacements      2007, 2008  . BLADDER SURGERY    . CARDIOVERSION N/A 02/04/2017   Procedure: CARDIOVERSION;  Surgeon: Arnoldo Lenis, MD;  [facility];  Service: Endoscopy;  Laterality: N/A;  . CARDIOVERSION N/A 02/26/2017   Procedure: CARDIOVERSION;  Surgeon: Satira Sark, MD;  Location: [facility];  Service: Cardiovascular;  Laterality: N/A;  . COLONOSCOPY N/A 06/15/2015   Procedure: COLONOSCOPY;  Surgeon: Rogene Houston, MD;  [facility];  Service: Endoscopy;  Laterality: N/A;  210  . CYSTOSCOPY W/ RETROGRADES Bilateral 05/14/2016   Procedure: CYSTOSCOPY WITH BILATERAL RETROGRADE PYELOGRAM;  Surgeon: Raynelle Bring, MD;  [facility];  Service: Urology;  Laterality: Bilateral;  GENERAL ANESTHESIA WITH PARALYSIS  . ESOPHAGOGASTRODUODENOSCOPY (EGD) WITH PROPOFOL N/A 06/14/2017   Procedure: ESOPHAGOGASTRODUODENOSCOPY (EGD) WITH PROPOFOL;  Surgeon: Rogene Houston, MD;  [facility];  Service:  Endoscopy;  Laterality: N/A;  . GIVENS CAPSULE STUDY  06/14/2017   Procedure: GIVENS CAPSULE STUDY;  Surgeon: Rogene Houston, MD;  [facility];  Service: Endoscopy;;  . LEFT HEART CATHETERIZATION WITH CORONARY ANGIOGRAM N/A 10/30/2013   Procedure: LEFT HEART CATHETERIZATION WITH CORONARY ANGIOGRAM;  Surgeon: Burnell Blanks, MD;  Location: [facility] CATH LAB;  Service: Cardiovascular;  Laterality: N/A;  . RIGHT/LEFT HEART CATH AND CORONARY ANGIOGRAPHY N/A 05/03/2017   Procedure: RIGHT/LEFT HEART CATH AND CORONARY ANGIOGRAPHY;  Surgeon: Leonie Man, MD;  Location: [facility] CV LAB;  Service: Cardiovascular;  Laterality: N/A;  . TEE WITHOUT CARDIOVERSION N/A 02/04/2017   Procedure: TRANSESOPHAGEAL ECHOCARDIOGRAM (TEE) WITH PROPOL;  Surgeon: Arnoldo Lenis, MD;  [facility];  Service: Endoscopy;  Laterality: N/A;  . TONSILLECTOMY    . TOTAL KNEE ARTHROPLASTY    . TRANSURETHRAL RESECTION OF BLADDER TUMOR N/A 05/14/2016   Procedure: TRANSURETHRAL RESECTION OF BLADDER TUMOR (TURBT);  Surgeon: Raynelle Bring, MD;  [facility];  Service: Urology;  Laterality: N/A;  GENERAL ANESTHESIA WITH PARALYSIS  . YAG LASER APPLICATION Bilateral 96/07/8936   Procedure: YAG LASER APPLICATION;  Surgeon: Williams Che, MD;  [facility]: [facility];  Service: Ophthalmology;  Laterality: Bilateral;     Medications Prior to Admission: Prior to Admission medications   Medication Sig Start Date End Date Taking? Authorizing Provider  acetaminophen (TYLENOL) 500 MG tablet Take 1,000 mg by mouth every 6 (six) hours as needed.    [provider]  atorvastatin (LIPITOR) 20 MG tablet TAKE ONE TABLET BY MOUTH AT BEDTIME 07/22/17   Imogene Burn, PA-C  balsalazide (COLAZAL) 750 MG capsule TAKE THREE CAPSULES BY MOUTH THREE TIMES DAILY (REPLACING DELZICOL) 12/09/16   Rehman, Mechele Dawley, MD  furosemide (LASIX) 20 MG tablet Take 2 tablets (40 mg total) by mouth 2 (two) times daily. 06/17/17  09/15/17  Imogene Burn, PA-C  ketotifen [facility] ALLERGY) 0.025 % ophthalmic solution Place 2 drops into both eyes 3 (three) times daily as needed (for dry eyes).     [provider]  lisinopril (PRINIVIL,ZESTRIL) 5 MG tablet Take 1 tablet (5 mg total) by mouth daily. 05/21/17 08/19/17  Camnitz, Ocie Doyne, MD  metoprolol succinate (TOPROL-XL) 50 MG 24 hr tablet TAKE 1 TABLET BY MOUTH TWICE DAILY with OR immediately following a meal 07/22/17   Imogene Burn, PA-C  Multiple Vitamins-Minerals (ICAPS AREDS 2 PO) Take 2 tablets by mouth daily at 6 PM.     [provider]  nitroGLYCERIN (NITROSTAT) 0.4 MG SL tablet Place 1 tablet (0.4 mg total) under the tongue every 5 (five) minutes as needed for chest pain. Patient not taking: Reported on 08/05/2017 02/06/17 08/01/17  Satira Sark, MD  potassium chloride (K-DUR) 10 MEQ tablet Take 2 tablets (20 mEq total) by mouth 2 (two) times daily. Patient taking differently: Take 20 mEq by mouth 3 (three) times daily.  06/17/17 09/15/17  Imogene Burn, PA-C  rivaroxaban (XARELTO) 20 MG TABS tablet Take 1 tablet (20 mg total) by mouth  daily with supper. 06/18/17   Murlean Iba, MD     Allergies:    Allergies  Allergen Reactions  . Sinus & Allergy [Chlorpheniramine-Phenylephrine] Shortness Of Breath  . Demerol Rash  . Penicillins Rash and Other (See Comments)    REACTION: rash, years ago Has patient had a PCN reaction causing immediate rash, facial/tongue/throat swelling, SOB or lightheadedness with hypotension: Yes Has patient had a PCN reaction causing severe rash involving mucus membranes or skin necrosis: No Has patient had a PCN reaction that required hospitalization No Has patient had a PCN reaction occurring within the last 10 years: No If all of the above answers are "NO", then may proceed with Cephalosporin use.     Social History:   Social History   Socioeconomic History  . Marital status: Married    Spouse  name: Not on file  . Number of children: Not on file  . Years of education: Not on file  . Highest education level: Not on file  Occupational History  . Not on file  Social Needs  . Financial resource strain: Not on file  . Food insecurity:    Worry: Not on file    Inability: Not on file  . Transportation needs:    Medical: Not on file    Non-medical: Not on file  Tobacco Use  . Smoking status: Never Smoker  . Smokeless tobacco: Never Used  Substance and Sexual Activity  . Alcohol use: No    Alcohol/week: 0.0 oz  . Drug use: No  . Sexual activity: Not Currently  Lifestyle  . Physical activity:    Days per week: Not on file    Minutes per session: Not on file  . Stress: Not on file  Relationships  . Social connections:    Talks on phone: Not on file    Gets together: Not on file    Attends religious service: Not on file    Active member of club or organization: Not on file    Attends meetings of clubs or organizations: Not on file    Relationship status: Not on file  . Intimate partner violence:    Fear of current or ex partner: Not on file    Emotionally abused: Not on file    Physically abused: Not on file    Forced sexual activity: Not on file  Other Topics Concern  . Not on file  Social History Narrative  . Not on file    Family History:   The patient's family history includes CAD in her father.    ROS:  Please see the history of present illness.  All other ROS reviewed and negative.     Physical Exam/Data:   Vitals:   08/05/17 1231  BP: 105/67  Pulse: 61  Temp: 98.3 F (36.8 C)  TempSrc: Oral  SpO2: 96%  Weight: 195 lb 9.6 oz (88.7 kg)  Height: 5' 3"  (1.6 m)   No intake or output data in the 24 hours ending 08/05/17 1312 Filed Weights   08/05/17 1231  Weight: 195 lb 9.6 oz (88.7 kg)   Body mass index is 34.65 kg/m.  General:  Well nourished, well developed, in no acute distress HEENT: normal Lymph: no adenopathy Neck: no JVD Endocrine:   No thryomegaly Vascular: No carotid bruits  Cardiac:  iRRR; soft SM, no gallops or rubs Lungs:  CTA b/l, no wheezing, rhonchi or rales  Abd: soft, nontender  Ext: no edema Musculoskeletal:  No deformities, age appropriate  atrophy  Skin: warm and dry  Neuro:   No gros focal abnormalities noted Psych:  Normal affect    EKG:  The ECG that was done today AFib, 99bpm, I measure QT 428m  Relevant CV Studies:  05/03/17: LHC  Hemodynamic findings consistent with mild secondary pulmonary hypertension.  Patient has severe nonischemic cardiomyopathy  Moderate to Severely reduced CO/CI  LV end diastolic pressure is moderately elevated.  Mid RCA stent widely patent  There is mild (2+) mitral regurgitation.   Angiographically minimal CAD Moderate to Severely reduced CO/CI with severely reduced EF on Echo  The patient has severe nonischemic cardiomyopathy with reduced EF and reduced output   04/25/17: TTE Study Conclusions - Left ventricle: The cavity size was mildly to moderately dilated.   Wall thickness was normal. The estimated ejection fraction was   approximately 25%. Diffuse hypokinesis. There is akinesis of the   anteroseptal myocardium. There is akinesis of the   basal-midinferior myocardium. Indeterminate diastolic function. - Ventricular septum: Septal motion showed abnormal function and   dyssynergy. - Aortic valve: Mildly to moderately calcified annulus. Trileaflet. - Mitral valve: Mildly calcified annulus. Mildly thickened leaflets   . There was moderate regurgitation. - Left atrium: The atrium was severely dilated. (420m - Right ventricle: Systolic function was mildly reduced. - Right atrium: The atrium was at the upper limits of normal in   size. Central venous pressure (est): 3 mm Hg. - Atrial septum: No defect or patent foramen ovale was identified. - Tricuspid valve: There was mild regurgitation. - Pulmonary arteries: PA peak pressure: 30 mm Hg (S). -  Pericardium, extracardiac: There was no pericardial effusion.  Laboratory Data:  Chemistry Recent Labs  Lab 08/05/17 1045  NA 138  K 4.0  CL 104  CO2 23  GLUCOSE 119*  BUN 21  CREATININE 1.02*  CALCIUM 9.4  GFRNONAA 49*  GFRAA 56*  ANIONGAP 11    No results for input(s): PROT, ALBUMIN, AST, ALT, ALKPHOS, BILITOT in the last 168 hours. HematologyNo results for input(s): WBC, RBC, HGB, HCT, MCV, MCH, MCHC, RDW, PLT in the last 168 hours. Cardiac EnzymesNo results for input(s): TROPONINI in the last 168 hours. No results for input(s): TROPIPOC in the last 168 hours.  BNPNo results for input(s): BNP, PROBNP in the last 168 hours.  DDimer No results for input(s): DDIMER in the last 168 hours.  Radiology/Studies:  No results found.  Assessment and Plan:   1. Persistent Afib     CHA2DS2Vasc is 4, on Xarelto, appropriately dosed     K+ 4.0     Mag 2.1     Creat 1.02 (Calc CrCl is 57)     QT, D.Maximino GreenlandNP, reports EKG reviewed with Dr. CaCurt Bearsgiven LBBB, QTc 4652mand OK to start Tikosyn      WIll start at 250m6mID for renal function Plan DCCV Wed if not in SR  2. CAD     No anginal complaints     Continue home regime, BB/statin, no ASA given OAC [place]. HTN     Continue home meds  For questions or updates, please contact [facility] consult [email or web address] for contact info under Cardiology/STEMI.    Signed, ReneBaldwin Jamaica-C  08/05/2017 1:12 PM   EP attending Patient seen and examined.  Agree with the findings as noted above. The patient is a very pleasant elderly woman with multiple medical problems including coronary artery disease, chronic systolic heart failure, left bundle branch  block, who is developed persistent atrial fibrillation and is admitted today to undergo initiation of dofetilide therapy in hopes of converting her back to sinus rhythm.  She has not had syncope.  She does not feel badly but gets dyspnea with exertion.  Exam is  accurately noted as above.  EKG has been reviewed and demonstrates atrial fibrillation with left bundle branch block.  Her QT interval is acceptable for initiation of dofetilide. 1.  Persistent atrial fibrillation  -She will be admitted to the hospital for initiation of dofetilide therapy.  We will watch her electrolytes, her renal function, and her QT interval. 2.  Chronic systolic heart failure -she currently appears to be euvolemic.  She will continue her current medications.  Cristopher Peru, MD

## 2017-08-05 NOTE — Progress Notes (Signed)
Primary Care Physician: Rory Percy, MD Referring Physician: [name] is a 84 y.o. female with  history of CAD status post RCA stent in 2014 paroxysmal atrial fibrillation failed cardioversion on amiodarone and nonischemic cardiomyopathy EF 25% and with recent cath showing widely patent RCA stent 2+ MR and severe nonischemic cardiomyopathy.  She is being followed by Dr. Curt Bears. She was not felt to be a good candidate for ablation.  She was referred to the A. fib clinic for dofetilide loading but she had been on amiodarone and was waiting for  amiodarone level to return a low enough level to start Tikosyn. Labs in June showed amio level at 0.2. But then 6/5, she had a GI bleed and xarelto was held and had to wait until back on drug x 3 weeks without interruption.  CHA2DS2-VASc equals 4.  She is in the afib clinic today for tikosyn admit. She does have a LBBB, qtc on EKG at 523 ms, corrected Dr. Curt Bears, thinks around 60 mc, thinks she is OK to continue with plans for admission. No benadryl use.No missed doses of  xarelto for at least 3 weeks.Drugs reviewed by pharmacist and no qtc prolonging drugs on board.   Today, she denies symptoms of palpitations, chest pain, shortness of breath, orthopnea, PND, lower extremity edema, dizziness, presyncope, syncope, or neurologic sequela. The patient is tolerating medications without difficulties and is otherwise without complaint today.   Past Medical History:  Diagnosis Date  . Acute blood loss anemia 06/2017  . Arthritis   . Atrial flutter ([place])   . Bladder cancer ([place])   . Coronary atherosclerosis of native coronary artery    a. BMS RCA May 2014 - [place] stent. b. Cath 04/2017 - patent stent, minimal CAD otherwise.  . Depression   . Essential hypertension, benign   . GI bleeding 06/2017   a. melena/small bowel ulcer by capsule endo 06/2017.  Marland Kitchen Hyperlipemia   . Macular degeneration   . NICM (nonischemic  cardiomyopathy) ([place])    a. EF fluctuating over the years in the 30s,40s, but most recently 25% in 04/2017.  Marland Kitchen Persistent atrial fibrillation ([place])   . Ulcerative colitis    Past Surgical History:  Procedure Laterality Date  . ABDOMINAL HYSTERECTOMY    . APPENDECTOMY    . Bilateral knee replacements      2007, 2008  . BLADDER SURGERY    . CARDIOVERSION N/A 02/04/2017   Procedure: CARDIOVERSION;  Surgeon: Arnoldo Lenis, MD;  [facility];  Service: Endoscopy;  Laterality: N/A;  . CARDIOVERSION N/A 02/26/2017   Procedure: CARDIOVERSION;  Surgeon: Satira Sark, MD;  Location: [facility];  Service: Cardiovascular;  Laterality: N/A;  . COLONOSCOPY N/A 06/15/2015   Procedure: COLONOSCOPY;  Surgeon: Rogene Houston, MD;  [facility];  Service: Endoscopy;  Laterality: N/A;  210  . CYSTOSCOPY W/ RETROGRADES Bilateral 05/14/2016   Procedure: CYSTOSCOPY WITH BILATERAL RETROGRADE PYELOGRAM;  Surgeon: Raynelle Bring, MD;  [facility];  Service: Urology;  Laterality: Bilateral;  GENERAL ANESTHESIA WITH PARALYSIS  . ESOPHAGOGASTRODUODENOSCOPY (EGD) WITH PROPOFOL N/A 06/14/2017   Procedure: ESOPHAGOGASTRODUODENOSCOPY (EGD) WITH PROPOFOL;  Surgeon: Rogene Houston, MD;  [facility];  Service: Endoscopy;  Laterality: N/A;  . GIVENS CAPSULE STUDY  06/14/2017   Procedure: GIVENS CAPSULE STUDY;  Surgeon: Rogene Houston, MD;  [facility];  Service: Endoscopy;;  . LEFT HEART CATHETERIZATION WITH CORONARY ANGIOGRAM N/A 10/30/2013  Procedure: LEFT HEART CATHETERIZATION WITH CORONARY ANGIOGRAM;  Surgeon: Burnell Blanks, MD;  Location: [facility] CATH LAB;  Service: Cardiovascular;  Laterality: N/A;  . RIGHT/LEFT HEART CATH AND CORONARY ANGIOGRAPHY N/A 05/03/2017   Procedure: RIGHT/LEFT HEART CATH AND CORONARY ANGIOGRAPHY;  Surgeon: Leonie Man, MD;  Location: [facility] CV LAB;  Service: Cardiovascular;  Laterality: N/A;  . TEE WITHOUT CARDIOVERSION N/A  02/04/2017   Procedure: TRANSESOPHAGEAL ECHOCARDIOGRAM (TEE) WITH PROPOL;  Surgeon: Arnoldo Lenis, MD;  [facility];  Service: Endoscopy;  Laterality: N/A;  . TONSILLECTOMY    . TOTAL KNEE ARTHROPLASTY    . TRANSURETHRAL RESECTION OF BLADDER TUMOR N/A 05/14/2016   Procedure: TRANSURETHRAL RESECTION OF BLADDER TUMOR (TURBT);  Surgeon: Raynelle Bring, MD;  [facility];  Service: Urology;  Laterality: N/A;  GENERAL ANESTHESIA WITH PARALYSIS  . YAG LASER APPLICATION Bilateral 02/12/8525   Procedure: YAG LASER APPLICATION;  Surgeon: Williams Che, MD;  Location[facility];  Service: Ophthalmology;  Laterality: Bilateral;    Current Outpatient Medications  Medication Sig Dispense Refill  . acetaminophen (TYLENOL) 500 MG tablet Take 1,000 mg by mouth every 6 (six) hours as needed.    Marland Kitchen atorvastatin (LIPITOR) 20 MG tablet TAKE ONE TABLET BY MOUTH AT BEDTIME 30 tablet 3  . balsalazide (COLAZAL) 750 MG capsule TAKE THREE CAPSULES BY MOUTH THREE TIMES DAILY (REPLACING DELZICOL) 270 capsule 3  . furosemide (LASIX) 20 MG tablet Take 2 tablets (40 mg total) by mouth 2 (two) times daily. 180 tablet 3  . ketotifen (THERATEARS ALLERGY) 0.025 % ophthalmic solution Place 2 drops into both eyes 3 (three) times daily as needed (for dry eyes).     Marland Kitchen lisinopril (PRINIVIL,ZESTRIL) 5 MG tablet Take 1 tablet (5 mg total) by mouth daily. 30 tablet 6  . metoprolol succinate (TOPROL-XL) 50 MG 24 hr tablet TAKE 1 TABLET BY MOUTH TWICE DAILY with OR immediately following a meal 180 tablet 1  . Multiple Vitamins-Minerals (ICAPS AREDS 2 PO) Take 2 tablets by mouth daily at 6 PM.     . potassium chloride (K-DUR) 10 MEQ tablet Take 2 tablets (20 mEq total) by mouth 2 (two) times daily. (Patient taking differently: Take 20 mEq by mouth 3 (three) times daily. ) 180 tablet 3  . rivaroxaban (XARELTO) 20 MG TABS tablet Take 1 tablet (20 mg total) by mouth daily with supper. 30 tablet 6  . nitroGLYCERIN (NITROSTAT)  0.4 MG SL tablet Place 1 tablet (0.4 mg total) under the tongue every 5 (five) minutes as needed for chest pain. (Patient not taking: Reported on 08/05/2017) 25 tablet 3   No current facility-administered medications for this encounter.     Allergies  Allergen Reactions  . Sinus & Allergy [Chlorpheniramine-Phenylephrine] Shortness Of Breath  . Demerol Rash  . Penicillins Rash and Other (See Comments)    REACTION: rash, years ago Has patient had a PCN reaction causing immediate rash, facial/tongue/throat swelling, SOB or lightheadedness with hypotension: Yes Has patient had a PCN reaction causing severe rash involving mucus membranes or skin necrosis: No Has patient had a PCN reaction that required hospitalization No Has patient had a PCN reaction occurring within the last 10 years: No If all of the above answers are "NO", then may proceed with Cephalosporin use.     Social History   Socioeconomic History  . Marital status: Married    Spouse name: Not on file  . Number of children: Not on file  . Years of education:  Not on file  . Highest education level: Not on file  Occupational History  . Not on file  Social Needs  . Financial resource strain: Not on file  . Food insecurity:    Worry: Not on file    Inability: Not on file  . Transportation needs:    Medical: Not on file    Non-medical: Not on file  Tobacco Use  . Smoking status: Never Smoker  . Smokeless tobacco: Never Used  Substance and Sexual Activity  . Alcohol use: No    Alcohol/week: 0.0 oz  . Drug use: No  . Sexual activity: Not Currently  Lifestyle  . Physical activity:    Days per week: Not on file    Minutes per session: Not on file  . Stress: Not on file  Relationships  . Social connections:    Talks on phone: Not on file    Gets together: Not on file    Attends religious service: Not on file    Active member of club or organization: Not on file    Attends meetings of clubs or organizations: Not on  file    Relationship status: Not on file  . Intimate partner violence:    Fear of current or ex partner: Not on file    Emotionally abused: Not on file    Physically abused: Not on file    Forced sexual activity: Not on file  Other Topics Concern  . Not on file  Social History Narrative  . Not on file    Family History  Problem Relation Age of Onset  . CAD Father     ROS- All systems are reviewed and negative except as per the HPI above  Physical Exam: Vitals:   08/05/17 1012  BP: 102/68  Pulse: 99  Weight: 196 lb (88.9 kg)  Height: 5' 3"  (1.6 m)   Wt Readings from Last 3 Encounters:  08/05/17 196 lb (88.9 kg)  07/23/17 197 lb (89.4 kg)  06/17/17 194 lb 9.6 oz (88.3 kg)    Labs: Lab Results  Component Value Date   NA 138 07/23/2017   K 3.9 07/23/2017   CL 105 07/23/2017   CO2 23 07/23/2017   GLUCOSE 106 (H) 07/23/2017   BUN 18 07/23/2017   CREATININE 0.91 07/23/2017   CALCIUM 9.1 07/23/2017   MG 2.2 07/23/2017   Lab Results  Component Value Date   INR 1.50 06/14/2017   No results found for: CHOL, HDL, LDLCALC, TRIG   GEN- The patient is well appearing, alert and oriented x 3 today.   Head- normocephalic, atraumatic Eyes-  Sclera clear, conjunctiva pink Ears- hearing intact Oropharynx- clear Neck- supple, no JVP Lymph- no cervical lymphadenopathy Lungs- Clear to ausculation bilaterally, normal work of breathing Heart- irregular rate and rhythm, no murmurs, rubs or gallops, PMI not laterally displaced GI- soft, NT, ND, + BS Extremities- no clubbing, cyanosis, or edema MS- no significant deformity or atrophy Skin- no rash or lesion Psych- euthymic mood, full affect Neuro- strength and sensation are intact  EKG-afib with BBB, Qtc 523 ms, reviewed with Dr. Curt Bears, thought to be corrected around 465 ms   Assessment and Plan: 1. Persistent afib General info re Tikosyn protocols  For Tikosyn admit Off amiodarone for many months  Level less than  O.3 when last checked in June No missed anticoagulation No benadryl use Bmet/mag today with Crcl cal at 56.59, based on today's creatinine of 1.02, will only qualify for 250 mcg's bid  of tikosyn, Kt at 4.0 and mag at 2.1  To  [place]. Kannon Baum, [facility] [address] [place], [place] [address] [phone]

## 2017-08-05 NOTE — Progress Notes (Signed)
Direct admit from Afib clinic into room 6E20 for Tikosyn load. Alert, oriented and able to make needs known. EP Tommye Standard, [place] paged and made aware of pt arrival to floor.Marland Kitchen Awaiting admission orders,. Leveda Anna, BSN, RN

## 2017-08-06 DIAGNOSIS — Z79899 Other long term (current) drug therapy: Secondary | ICD-10-CM

## 2017-08-06 DIAGNOSIS — Z5181 Encounter for therapeutic drug level monitoring: Secondary | ICD-10-CM

## 2017-08-06 LAB — BASIC METABOLIC PANEL
Anion gap: 10 (ref 5–15)
BUN: 19 mg/dL (ref 8–23)
CO2: 25 mmol/L (ref 22–32)
CREATININE: 0.97 mg/dL (ref 0.44–1.00)
Calcium: 9.2 mg/dL (ref 8.9–10.3)
Chloride: 106 mmol/L (ref 98–111)
GFR calc Af Amer: >60 mL/min (ref >60)
GFR, EST NON AFRICAN AMERICAN: 52 mL/min — AB (ref >60)
GLUCOSE: 87 mg/dL (ref 70–99)
Potassium: 4.1 mmol/L (ref 3.5–5.1)
Sodium: 141 mmol/L (ref 135–145)

## 2017-08-06 LAB — MAGNESIUM: MAGNESIUM: 2.2 mg/dL (ref 1.7–2.4)

## 2017-08-06 MED ORDER — OCUVITE-LUTEIN PO CAPS
2.0000 | ORAL_CAPSULE | Freq: Every day | ORAL | Status: DC
  Filled 2017-08-06: qty 2

## 2017-08-06 MED ORDER — FUROSEMIDE 40 MG PO TABS
40.0000 mg | ORAL_TABLET | Freq: Two times a day (BID) | ORAL | Status: DC
  Filled 2017-08-06: qty 1

## 2017-08-06 MED ORDER — FUROSEMIDE 40 MG PO TABS
40.0000 mg | ORAL_TABLET | Freq: Two times a day (BID) | ORAL | Status: DC
  Administered 2017-08-06 – 2017-08-08 (×5): 40 mg via ORAL
  Filled 2017-08-06 (×4): qty 1

## 2017-08-06 MED ORDER — HYDROCORTISONE 1 % EX CREA
1.0000 "application " | TOPICAL_CREAM | Freq: Three times a day (TID) | CUTANEOUS | Status: DC | PRN
  Filled 2017-08-06: qty 28

## 2017-08-06 MED ORDER — ENSURE ENLIVE PO LIQD: 237.0000 mL | Freq: Two times a day (BID) | ORAL | Status: DC | PRN

## 2017-08-06 MED ORDER — PROSIGHT PO TABS
2.0000 | ORAL_TABLET | Freq: Every day | ORAL | Status: DC
  Administered 2017-08-06 – 2017-08-08 (×3): 2 via ORAL
  Filled 2017-08-06 (×3): qty 2

## 2017-08-06 MED ORDER — SODIUM CHLORIDE 0.9 % IV SOLN: 250.0000 mL | INTRAVENOUS | Status: DC

## 2017-08-06 MED ORDER — RIVAROXABAN 20 MG PO TABS: 20.0000 mg | ORAL_TABLET | Freq: Every day | ORAL | Status: DC

## 2017-08-06 MED ORDER — SODIUM CHLORIDE 0.9% FLUSH
3.0000 mL | Freq: Two times a day (BID) | INTRAVENOUS | Status: DC
  Administered 2017-08-06 – 2017-08-08 (×5): 3 mL via INTRAVENOUS

## 2017-08-06 MED ORDER — OFF THE BEAT BOOK
Freq: Once | Status: DC
  Filled 2017-08-06: qty 1

## 2017-08-06 MED ORDER — RIVAROXABAN 20 MG PO TABS
20.0000 mg | ORAL_TABLET | Freq: Every day | ORAL | Status: DC
  Administered 2017-08-06 – 2017-08-08 (×3): 20 mg via ORAL
  Filled 2017-08-06 (×3): qty 1

## 2017-08-06 MED ORDER — SODIUM CHLORIDE 0.9% FLUSH: 3.0000 mL | INTRAVENOUS | Status: DC | PRN

## 2017-08-06 NOTE — Progress Notes (Signed)
Nutrition Brief Note  Patient identified on the Malnutrition Screening Tool (MST) Report. Patient reports some weight loss since last December. Overall, she has lost ~13% of usual weight within the past year, which is not significant for the time frame. Patient says she was not able to eat much for a few months starting in December, but since then has been eating better. Weight has been stable for the past 2-3 months.   Nutrition focused physical exam completed.  No muscle or subcutaneous fat depletion noticed.   Wt Readings from Last 20 Encounters:  08/06/17 194 lb (88 kg)  08/05/17 196 lb (88.9 kg)  07/23/17 197 lb (89.4 kg)  06/17/17 194 lb 9.6 oz (88.3 kg)  06/15/17 193 lb 9.6 oz (87.8 kg)  06/10/17 193 lb 1.6 oz (87.6 kg)  05/21/17 196 lb (88.9 kg)  05/03/17 200 lb 9.6 oz (91 kg)  04/30/17 202 lb (91.6 kg)  04/24/17 203 lb (92.1 kg)  03/12/17 209 lb (94.8 kg)  02/28/17 219 lb 5.7 oz (99.5 kg)  02/04/17 226 lb (102.5 kg)  01/25/17 231 lb (104.8 kg)  01/22/17 229 lb 1.6 oz (103.9 kg)  01/03/17 233 lb 11 oz (106 kg)  05/19/16 223 lb (101.2 kg)  05/14/16 223 lb (101.2 kg)  05/11/16 223 lb (101.2 kg)  05/02/16 226 lb 4.8 oz (102.6 kg)     Body mass index is 34.37 kg/m. Patient meets criteria for obesity based on current BMI.   Current diet order is heart healthy, patient is consuming approximately 75-90% of meals at this time. Labs and medications reviewed.   No nutrition interventions warranted at this time. If nutrition issues arise, please consult RD.   Molli Barrows, RD, LDN, [place] Pager [phone] After Hours Pager [phone]

## 2017-08-06 NOTE — Care Management Note (Addendum)
Case Management Note  Patient Details  Name: [name] MRN: [id number] Date of Birth: 11/25/1930  Subjective/Objective: Patient presented for Tikosyn loading. Patient lives at home with spouse, independent with ADLs, ambulating with a walker and utilizing a BSC. Patient tentatively scheduled for a DCCV on 08/07/17 if not in SR. Patient confirmed her medications are filled at [facility].                    Action/Plan: Benefits check for defetilide. CM will contact [facility] to confirm medication is in stock. CM will assist with a 7 day supply of defetilide from [place]. Patient will need an additional Rx with refills. CM will continue to follow for transitional needs.   S/ W LYN A. @ [facility] # [phone]    1. TIKOSYN 125 MCG  250 MCG AND 500 MCG BID  COVER- NONE FORMULARY  PRIOR APPROVAL- YES # [phone] OPT- 5 FOR EXCEPTION    2. DOFETILIDE 125 MCG BID  COVER- YES  CO-PAY- 25 % OF TOTAL COAST  TIER- 4 DRUG  PRIOR APPROVAL- NO   3. DOFETILIDE 250 MCG BID  COVER- YES  CO-PAY- 25 % OF TOTAL COAST  TIER- 4 DRUG  PRIOR APPROVAL- NO    4 DOFETILIDE 500 MCG BID  COVER- YES  CO-PAY- 25 % OF TOTAL COAST  TIER- 4 DRUG  PRIOR APPROVAL- NO   PATIENT IN COVERAGE GAP   PREFERRED PHARMACY : YES  [facility]   Expected Discharge Date:                  Expected Discharge Plan:  Home/Self Care  In-House Referral:  NA  Discharge planning Services  CM Consult, Medication Assistance(Tikosyn Assistance)  Post Acute Care Choice:  NA Choice offered to:  NA  DME Arranged:  N/A DME Agency:  NA  HH Arranged:  NA HH Agency:  NA  Status of Service:  Completed, signed off  If discussed at Long Length of Stay Meetings, dates discussed:    Additional Comments: 08/06/17 [place] BSN RNCM [phone]  CM spoke with Pharmacist at [facility] and confirmed Tikosyn 250 mcg quantity of 60 could be ordered. No further needs at this time.   Georgeanna Lea, RN 08/06/2017, 10:29 AM

## 2017-08-06 NOTE — Progress Notes (Addendum)
Progress Note  Patient Name: [name] Date of Encounter: 08/06/2017  Primary Cardiologist: Rozann Lesches, MD   Subjective   Slept well, no complaints.  Reports she takes her Xarelto in AM with breakfast which is her largest meal, and also asks her afternoon lasix be given at 1500, otherwise no concerns  Inpatient Medications    Scheduled Meds: . atorvastatin  20 mg Oral QHS  . balsalazide  750 mg Oral TID AC  . dofetilide  250 mcg Oral BID  . feeding supplement (ENSURE ENLIVE)  237 mL Oral BID BM  . furosemide  40 mg Oral BID  . ketotifen  2 drop Both Eyes BID  . lisinopril  5 mg Oral Daily  . metoprolol succinate  50 mg Oral Daily  . off the beat book   Does not apply Once  . potassium chloride  20 mEq Oral TID  . PRESERVISION AREDS 2  2 capsule Oral q1800  . rivaroxaban  20 mg Oral Q supper  . sodium chloride flush  3 mL Intravenous Q12H   Continuous Infusions: . sodium chloride     PRN Meds: sodium chloride, acetaminophen, nitroGLYCERIN, sodium chloride flush   Vital Signs    Vitals:   08/05/17 1231 08/05/17 1800 08/05/17 2059 08/06/17 0621  BP: 105/67  97/78 97/71  Pulse: 61  80 100  Resp:  14 16 16   Temp: 98.3 F (36.8 C)  98 F (36.7 C) 97.8 F (36.6 C)  TempSrc: Oral  Oral Oral  SpO2: 96%  98% 96%  Weight: 195 lb 9.6 oz (88.7 kg)   194 lb (88 kg)  Height: 5' 3"  (1.6 m)       Intake/Output Summary (Last 24 hours) at 08/06/2017 0646 Last data filed at 08/06/2017 0338 Gross per 24 hour  Intake 480 ml  Output 600 ml  Net -120 ml   Filed Weights   08/05/17 1231 08/06/17 0621  Weight: 195 lb 9.6 oz (88.7 kg) 194 lb (88 kg)    Telemetry    AFib 90's - Personally Reviewed  ECG    AFib, 93bpm, mannual measured QT 47m, LBBB QRS 1648m - Personally Reviewed  Physical Exam   GEN: No acute distress.   Neck: No JVD Cardiac: iRiRR, no murmurs, rubs, or gallops.  Respiratory: CTA b/l. GI: Soft, nontender, non-distended  MS: No edema;  No deformity. Neuro:  Nonfocal  Psych: Normal affect   Labs    Chemistry Recent Labs  Lab 08/05/17 1045 08/06/17 0451  NA 138 141  K 4.0 4.1  CL 104 106  CO2 23 25  GLUCOSE 119* 87  BUN 21 19  CREATININE 1.02* 0.97  CALCIUM 9.4 9.2  GFRNONAA 49* 52*  GFRAA 56* >60  ANIONGAP 11 10     HematologyNo results for input(s): WBC, RBC, HGB, HCT, MCV, MCH, MCHC, RDW, PLT in the last 168 hours.  Cardiac EnzymesNo results for input(s): TROPONINI in the last 168 hours. No results for input(s): TROPIPOC in the last 168 hours.   BNPNo results for input(s): BNP, PROBNP in the last 168 hours.   DDimer No results for input(s): DDIMER in the last 168 hours.   Radiology    No results found.  Cardiac Studies   05/03/17: LHC  Hemodynamic findings consistent with mild secondary pulmonary hypertension.  Patient has severe nonischemic cardiomyopathy  Moderate to Severely reduced CO/CI  LV end diastolic pressure is moderately elevated.  Mid RCA stent widely patent  There is  mild (2+) mitral regurgitation.  Angiographically minimal CAD Moderate to Severely reduced CO/CI with severely reduced EF on Echo  The patient has severe nonischemic cardiomyopathy with reduced EF and reduced output   04/25/17: TTE Study Conclusions - Left ventricle: The cavity size was mildly to moderately dilated. Wall thickness was normal. The estimated ejection fraction was approximately 25%. Diffuse hypokinesis. There is akinesis of the anteroseptal myocardium. There is akinesis of the basal-midinferior myocardium. Indeterminate diastolic function. - Ventricular septum: Septal motion showed abnormal function and dyssynergy. - Aortic valve: Mildly to moderately calcified annulus. Trileaflet. - Mitral valve: Mildly calcified annulus. Mildly thickened leaflets . There was moderate regurgitation. - Left atrium: The atrium was severely dilated. (39m) - Right ventricle: Systolic function  was mildly reduced. - Right atrium: The atrium was at the upper limits of normal in size. Central venous pressure (est): 3 mm Hg. - Atrial septum: No defect or patent foramen ovale was identified. - Tricuspid valve: There was mild regurgitation. - Pulmonary arteries: PA peak pressure: 30 mm Hg (S). - Pericardium, extracardiac: There was no pericardial effusion.    Patient Profile     84y.o. female with a history of CAD (s/p BMS to RCA in 2014, patent stent by cath in 04/2017), UC, macular degeneration, bladder ca, depression, HTN, anemia (s/p underwent EGD June 2019 which was unremarkable and capsule study where GI bleeding was felt likely due to small bowel ulcerand discontinuation of aspirin was recommended along with resuming anticoagulant in 3 days, NICM, and persistent AFib, admitted for Tikosyn initiation  Assessment & Plan    1. Persistent Afib     CHA2DS2Vasc is 4, on Xarelto, appropriately dosed at 275mdaily (patient took yesterday dose at home in AM)     K+ 4.1     Mag 2.2     Creat 0.97 (Calc CrCl is 59)     QTc is OK to proceed given LBBB/QRS 16634morrects to 491m18mollow closely      Continue 250mc32mD for renal function Plan DCCV tomorrow if not in SR  Will adjust xarelto to AM dosing with breakfast  2. CAD     No anginal complaints     Continue home regime, BB/statin, no ASA given OAC  [place] HTN     Continue home meds  4. NICM     No symptoms or exam findings to suggest fluid OL     Continue home diuretic regime      Lytes and renal function are stable   For questions or updates, please contact [facility] consult [email or web address] for contact info under Cardiology/STEMI.      Signed, [name]  08/06/2017, 6:46 AM    I have seen and examined this patient with [name] with above, note added to reflect my findings.  On exam, RRR, no murmurs, lungs clear.  Admitted for dofetilide load.  QTC remains stable at just less  than 500 ms.  We will continue dofetilide at current dose.  Plan for likely cardioversion tomorrow.  [name] MD 08/06/2017 8:53 AM

## 2017-08-06 NOTE — Progress Notes (Signed)
Post dose EKG QTc=533.  Tommye Standard, PA notified.

## 2017-08-06 NOTE — Progress Notes (Signed)
Post dose EKG this morning reviewed. I measure QT420m, given LBBB, QRS measured at 1659m QTc is OK to continue load  ReTommye StandardPAc

## 2017-08-07 ENCOUNTER — Ambulatory Visit (HOSPITAL_COMMUNITY): Admission: RE | Admit: 2017-08-07 | Payer: Medicare Other | Source: Ambulatory Visit | Admitting: Internal Medicine

## 2017-08-07 ENCOUNTER — Encounter (HOSPITAL_COMMUNITY): Payer: Self-pay | Admitting: *Deleted

## 2017-08-07 ENCOUNTER — Inpatient Hospital Stay (HOSPITAL_COMMUNITY): Payer: Medicare Other | Admitting: Anesthesiology

## 2017-08-07 ENCOUNTER — Encounter (HOSPITAL_COMMUNITY)
Admission: RE | Disposition: A | Discharge status: Home / Self Care | Payer: Self-pay | Source: Ambulatory Visit | Attending: Cardiology

## 2017-08-07 DIAGNOSIS — I483 Typical atrial flutter: Secondary | ICD-10-CM

## 2017-08-07 HISTORY — PX: CARDIOVERSION: SHX1299

## 2017-08-07 LAB — BASIC METABOLIC PANEL
Anion gap: 9 (ref 5–15)
BUN: 19 mg/dL (ref 8–23)
CALCIUM: 9 mg/dL (ref 8.9–10.3)
CHLORIDE: 106 mmol/L (ref 98–111)
CO2: 23 mmol/L (ref 22–32)
Creatinine, Ser: 0.85 mg/dL (ref 0.44–1.00)
GFR calc Af Amer: >60 mL/min (ref >60)
GLUCOSE: 87 mg/dL (ref 70–99)
POTASSIUM: 3.9 mmol/L (ref 3.5–5.1)
Sodium: 138 mmol/L (ref 135–145)

## 2017-08-07 LAB — MAGNESIUM: MAGNESIUM: 2.1 mg/dL (ref 1.7–2.4)

## 2017-08-07 SURGERY — CARDIOVERSION
Anesthesia: General

## 2017-08-07 MED ORDER — ICAPS AREDS 2 PO CAPS: ORAL_CAPSULE | Freq: Every day | ORAL | Status: DC

## 2017-08-07 MED ORDER — EPHEDRINE SULFATE 50 MG/ML IJ SOLN
INTRAMUSCULAR | Status: DC | PRN
  Administered 2017-08-07: 5 mg via INTRAVENOUS

## 2017-08-07 MED ORDER — SODIUM CHLORIDE 0.9 % IV SOLN
INTRAVENOUS | Status: DC
  Administered 2017-08-07: 10:00:00 via INTRAVENOUS

## 2017-08-07 MED ORDER — POTASSIUM CHLORIDE CRYS ER 10 MEQ PO TBCR
10.0000 meq | EXTENDED_RELEASE_TABLET | Freq: Once | ORAL | Status: AC
  Administered 2017-08-07: 10 meq via ORAL
  Filled 2017-08-07: qty 1

## 2017-08-07 MED ORDER — LIDOCAINE 2% (20 MG/ML) 5 ML SYRINGE
INTRAMUSCULAR | Status: DC | PRN
  Administered 2017-08-07: 80 mg via INTRAVENOUS

## 2017-08-07 MED ORDER — PROPOFOL 10 MG/ML IV BOLUS
INTRAVENOUS | Status: DC | PRN
  Administered 2017-08-07: 50 mg via INTRAVENOUS

## 2017-08-07 NOTE — CV Procedure (Signed)
   CARDIOVERSION NOTE  Procedure: Electrical Cardioversion Indications:  Atrial Flutter  Procedure Details:  Consent: Risks of procedure as well as the alternatives and risks of each were explained to the (patient/caregiver).  Consent for procedure obtained.  Time Out: Verified patient identification, verified procedure, site/side was marked, verified correct patient position, special equipment/implants available, medications/allergies/relevent history reviewed, required imaging and test results available.  Performed  Patient placed on cardiac monitor, pulse oximetry, supplemental oxygen as necessary.  Sedation given: propofol per anesthesia Pacer pads placed anterior and posterior chest.  Cardioverted 1 time(s).  Cardioverted at 120J biphasic.  Impression: Findings: Post procedure EKG shows: NSR Complications: None Patient did tolerate procedure well.  Plan: 1. Successful DCCV with a single 120J biphasic shock.  Time Spent Directly with the Patient:  30 minutes   Pixie Casino, MD, [facility], [place] Director of the Advanced Lipid Disorders &  Cardiovascular Risk Reduction Clinic Diplomate of the American Board of Clinical Lipidology Attending Cardiologist  Direct Dial: [phone]  Fax: [phone]  Website:  [email or web address] 08/07/2017, 11:26 AM

## 2017-08-07 NOTE — Progress Notes (Addendum)
Progress Note  Patient Name: [name] Date of Encounter: 08/07/2017  Primary Cardiologist: Rozann Lesches, MD   Subjective   Tolerating drug, no complaints  Inpatient Medications    Scheduled Meds: . atorvastatin  20 mg Oral QHS  . balsalazide  750 mg Oral TID AC  . dofetilide  250 mcg Oral BID  . furosemide  40 mg Oral BID  . ketotifen  2 drop Both Eyes BID  . lisinopril  5 mg Oral Daily  . metoprolol succinate  50 mg Oral Daily  . multivitamin  2 tablet Oral Daily  . potassium chloride  20 mEq Oral TID  . rivaroxaban  20 mg Oral Daily  . sodium chloride flush  3 mL Intravenous Q12H  . sodium chloride flush  3 mL Intravenous Q12H   Continuous Infusions: . sodium chloride    . sodium chloride     PRN Meds: sodium chloride, acetaminophen, feeding supplement (ENSURE ENLIVE), hydrocortisone cream, nitroGLYCERIN, sodium chloride flush, sodium chloride flush   Vital Signs    Vitals:   08/06/17 0838 08/06/17 1334 08/06/17 2052 08/07/17 0434  BP: 140/73 97/67 96/68  99/72  Pulse: 97 95 97 98  Resp:   17 17  Temp:  97.8 F (36.6 C) 98 F (36.7 C) 97.7 F (36.5 C)  TempSrc:  Oral Oral Oral  SpO2:  98% 98% 95%  Weight:    192 lb 6.4 oz (87.3 kg)  Height:        Intake/Output Summary (Last 24 hours) at 08/07/2017 0714 Last data filed at 08/07/2017 0437 Gross per 24 hour  Intake 720 ml  Output 2700 ml  Net -1980 ml   Filed Weights   08/05/17 1231 08/06/17 0621 08/07/17 0434  Weight: 195 lb 9.6 oz (88.7 kg) 194 lb (88 kg) 192 lb 6.4 oz (87.3 kg)    Telemetry    AFlutter generallly 90's - Personally Reviewed  ECG    AFlutter 2:1, 100bpm, mannual measured QT 414m, LBBB QRS 1632m - Personally Reviewed  Physical Exam   Exam is unchanged this morning GEN: No acute distress.   Neck: No JVD Cardiac: iRiRR, no murmurs, rubs, or gallops.  Respiratory: CTA b/l. GI: Soft, nontender, non-distended  MS: No edema; No deformity. Neuro:  Nonfocal    Psych: Normal affect   Labs    Chemistry Recent Labs  Lab 08/05/17 1045 08/06/17 0451 08/07/17 0532  NA 138 141 138  K 4.0 4.1 3.9  CL 104 106 106  CO2 23 25 23   GLUCOSE 119* 87 87  BUN 21 19 19   CREATININE 1.02* 0.97 0.85  CALCIUM 9.4 9.2 9.0  GFRNONAA 49* 52* >60  GFRAA 56* >60 >60  ANIONGAP 11 10 9      HematologyNo results for input(s): WBC, RBC, HGB, HCT, MCV, MCH, MCHC, RDW, PLT in the last 168 hours.  Cardiac EnzymesNo results for input(s): TROPONINI in the last 168 hours. No results for input(s): TROPIPOC in the last 168 hours.   BNPNo results for input(s): BNP, PROBNP in the last 168 hours.   DDimer No results for input(s): DDIMER in the last 168 hours.   Radiology    No results found.  Cardiac Studies   05/03/17: LHC  Hemodynamic findings consistent with mild secondary pulmonary hypertension.  Patient has severe nonischemic cardiomyopathy  Moderate to Severely reduced CO/CI  LV end diastolic pressure is moderately elevated.  Mid RCA stent widely patent  There is mild (2+) mitral regurgitation.  Angiographically minimal  CAD Moderate to Severely reduced CO/CI with severely reduced EF on Echo  The patient has severe nonischemic cardiomyopathy with reduced EF and reduced output   04/25/17: TTE Study Conclusions - Left ventricle: The cavity size was mildly to moderately dilated. Wall thickness was normal. The estimated ejection fraction was approximately 25%. Diffuse hypokinesis. There is akinesis of the anteroseptal myocardium. There is akinesis of the basal-midinferior myocardium. Indeterminate diastolic function. - Ventricular septum: Septal motion showed abnormal function and dyssynergy. - Aortic valve: Mildly to moderately calcified annulus. Trileaflet. - Mitral valve: Mildly calcified annulus. Mildly thickened leaflets . There was moderate regurgitation. - Left atrium: The atrium was severely dilated. (60m) - Right  ventricle: Systolic function was mildly reduced. - Right atrium: The atrium was at the upper limits of normal in size. Central venous pressure (est): 3 mm Hg. - Atrial septum: No defect or patent foramen ovale was identified. - Tricuspid valve: There was mild regurgitation. - Pulmonary arteries: PA peak pressure: 30 mm Hg (S). - Pericardium, extracardiac: There was no pericardial effusion.    Patient Profile     84y.o. female with a history of CAD (s/p BMS to RCA in 2014, patent stent by cath in 04/2017), UC, macular degeneration, bladder ca, depression, HTN, anemia (s/p underwent EGD June 2019 which was unremarkable and capsule study where GI bleeding was felt likely due to small bowel ulcerand discontinuation of aspirin was recommended along with resuming anticoagulant in 3 days, NICM, and persistent AFib, admitted for Tikosyn initiation  Assessment & Plan    1. Persistent Afib     CHA2DS2Vasc is 4, on Xarelto, appropriately dosed at 26mdaily (patient took yesterday dose at home in AM)     K+ 3.9 (small additional replacment ordered)     Mag 2.1     Creat 0.85 (Calc CrCl is 71), baseline Creat more along 0.9-1.0 range, will not up-titrate dose     QTc is stable, will re-evaluate in SR post DCCV, follow closely      For DCCV today Anticipate discharge tomorrow  2. CAD     No anginal complaints     Continue home regime, BB/statin, no ASA given OALoyal 3. HTN     Continue home meds  4. NICM     No symptoms or exam findings to suggest fluid OL     Continue home diuretic regime     Lytes and renal function are stable      For questions or updates, please contact [facility] consult [email or web address] for contact info under Cardiology/STEMI.      Signed, ReBaldwin JamaicaPA-C  08/07/2017, 7:14 AM      I have seen and examined this patient with ReTommye Standard Agree with above, note added to reflect my findings.  On exam, RRR, tachycardic, no murmurs, lungs  clear.  Patient on Tikosyn 250 mcg daily.  Has converted to atrial flutter.  Plan for cardioversion today.  [name] MD 08/07/2017 7:48 AM

## 2017-08-07 NOTE — Transfer of Care (Signed)
Immediate Anesthesia Transfer of Care Note  Patient: [name]  Procedure(s) Performed: CARDIOVERSION (N/A )  Patient Location: PACU and Endoscopy Unit  Anesthesia Type:General  Level of Consciousness: patient cooperative and responds to stimulation  Airway & Oxygen Therapy: Patient Spontanous Breathing and Patient connected to nasal cannula oxygen  Post-op Assessment: Report given to RN and Post -op Vital signs reviewed and stable  Post vital signs: Reviewed and stable  Last Vitals:  Vitals Value Taken Time  BP 86/48 08/07/2017 11:21 AM  Temp    Pulse 47 08/07/2017 11:24 AM  Resp 21 08/07/2017 11:24 AM  SpO2 96 % 08/07/2017 11:24 AM    Last Pain:  Vitals:   08/07/17 1015  TempSrc: Oral  PainSc: 0-No pain      Patients Stated Pain Goal: 0 (46/50/35 4656)  Complications: No apparent anesthesia complications

## 2017-08-07 NOTE — Anesthesia Preprocedure Evaluation (Signed)
Anesthesia Evaluation  Patient identified by MRN, date of birth, ID band Patient awake    Reviewed: Allergy & Precautions, H&P , NPO status , Patient's Chart, lab work & pertinent test results, reviewed documented beta blocker date and time   Airway Mallampati: II  TM Distance: >3 FB Neck ROM: full    Dental no notable dental hx.    Pulmonary neg pulmonary ROS,    Pulmonary exam normal breath sounds clear to auscultation       Cardiovascular Exercise Tolerance: Good hypertension, + CAD and +CHF  negative cardio ROS   Rhythm:regular Rate:Normal     Neuro/Psych PSYCHIATRIC DISORDERS Depression negative neurological ROS  negative psych ROS   GI/Hepatic negative GI ROS, Neg liver ROS, PUD,   Endo/Other  negative endocrine ROS  Renal/GU negative Renal ROS  negative genitourinary   Musculoskeletal   Abdominal   Peds  Hematology negative hematology ROS (+) anemia ,   Anesthesia Other Findings   Reproductive/Obstetrics negative OB ROS                             Anesthesia Physical  Anesthesia Plan  ASA: III  Anesthesia Plan: General   Post-op Pain Management:    Induction: Intravenous  PONV Risk Score and Plan: 3 and Treatment may vary due to age or medical condition  Airway Management Planned: Mask  Additional Equipment:   Intra-op Plan:   Post-operative Plan:   Informed Consent: I have reviewed the patients History and Physical, chart, labs and discussed the procedure including the risks, benefits and alternatives for the proposed anesthesia with the patient or authorized representative who has indicated his/her understanding and acceptance.     Plan Discussed with: CRNA  Anesthesia Plan Comments:         Anesthesia Quick Evaluation

## 2017-08-07 NOTE — H&P (Signed)
   INTERVAL PROCEDURE H&P  History and Physical Interval Note:  08/07/2017 10:15 AM  [name] has presented today for their planned procedure. The various methods of treatment have been discussed with the patient and family. After consideration of risks, benefits and other options for treatment, the patient has consented to the procedure.  The patients' outpatient history has been reviewed, patient examined, and no change in status from most recent office note within the past 30 days. I have reviewed the patients' chart and labs and will proceed as planned. Questions were answered to the patient's satisfaction.   Pixie Casino, MD, [facility], [place] Director of the Advanced Lipid Disorders &  Cardiovascular Risk Reduction Clinic Diplomate of the American Board of Clinical Lipidology Attending Cardiologist  Direct Dial: [phone]  Fax: [phone]  Website:  [email or web address] 08/07/2017, 10:15 AM

## 2017-08-07 NOTE — Progress Notes (Signed)
Patient's 3 hour after 3rd dose of Tikosyn shows that she converted into a NSR with a BBB, text paged Dr Aundra Dubin to make him aware, will keep NPO after midnight in case she would go back into A-Fib, she's scheduled for cardioversion at 2:30 PM 7/31, patient and family aware, will continue to monitor.

## 2017-08-07 NOTE — Anesthesia Postprocedure Evaluation (Signed)
Anesthesia Post Note  Patient: [name]  Procedure(s) Performed: CARDIOVERSION (N/A )     Patient location during evaluation: Endoscopy Anesthesia Type: General Level of consciousness: awake and alert Pain management: pain level controlled Vital Signs Assessment: post-procedure vital signs reviewed and stable Respiratory status: spontaneous breathing, nonlabored ventilation and respiratory function stable Cardiovascular status: blood pressure returned to baseline and stable Postop Assessment: no apparent nausea or vomiting Anesthetic complications: no    Last Vitals:  Vitals:   08/07/17 1130 08/07/17 1145  BP: 105/70 (!) 100/57  Pulse: 61 (!) 58  Resp: 17 16  Temp: 37.1 C   SpO2: 100% 93%    Last Pain:  Vitals:   08/07/17 1145  TempSrc:   PainSc: 0-No pain                 Lynda Rainwater

## 2017-08-08 LAB — BASIC METABOLIC PANEL
Anion gap: 9 (ref 5–15)
BUN: 16 mg/dL (ref 8–23)
CO2: 24 mmol/L (ref 22–32)
CREATININE: 0.83 mg/dL (ref 0.44–1.00)
Calcium: 9.1 mg/dL (ref 8.9–10.3)
Chloride: 106 mmol/L (ref 98–111)
GFR calc Af Amer: >60 mL/min (ref >60)
Glucose, Bld: 84 mg/dL (ref 70–99)
POTASSIUM: 4.1 mmol/L (ref 3.5–5.1)
SODIUM: 139 mmol/L (ref 135–145)

## 2017-08-08 LAB — MAGNESIUM: MAGNESIUM: 2.2 mg/dL (ref 1.7–2.4)

## 2017-08-08 MED ORDER — POTASSIUM CHLORIDE CRYS ER 20 MEQ PO TBCR: 20.0000 meq | EXTENDED_RELEASE_TABLET | Freq: Three times a day (TID) | ORAL | 6 refills | Status: DC

## 2017-08-08 MED ORDER — DOFETILIDE 250 MCG PO CAPS: 250.0000 ug | ORAL_CAPSULE | Freq: Two times a day (BID) | ORAL | 3 refills | Status: DC

## 2017-08-08 MED ORDER — METOPROLOL SUCCINATE ER 25 MG PO TB24: 25.0000 mg | ORAL_TABLET | Freq: Every day | ORAL | 6 refills | Status: DC

## 2017-08-08 MED ORDER — METOPROLOL SUCCINATE ER 25 MG PO TB24
25.0000 mg | ORAL_TABLET | Freq: Every day | ORAL | Status: DC
  Administered 2017-08-08: 25 mg via ORAL
  Filled 2017-08-08: qty 1

## 2017-08-08 NOTE — Progress Notes (Addendum)
Discharge order received.  IV and Telemetry removed.  Discharge instructions reviewed with patient and daughter.  Week supply of Tikosyn given to patient.  Patient taken to daughter's car via wheelchair.

## 2017-08-08 NOTE — Discharge Summary (Addendum)
ELECTROPHYSIOLOGY PROCEDURE DISCHARGE SUMMARY    Patient ID: [name],  MRN: [id number], DOB/AGE: Apr 07, 1930 84 y.o.  Admit date: 08/05/2017 Discharge date: 08/08/2017  Primary Care Physician: Rory Percy, MD  Primary Cardiologist: Dr. Domenic Polite Electrophysiologist: Dr. Curt Bears  Primary Discharge Diagnosis:  1.  Persistent atrial fibrillation status post Tikosyn loading this admission      CHA2DS2Vasc is 5, on xarelto appropriately dosed   Secondary Discharge Diagnosis:  1. CAD 2. Macular degeneration 3. HTN 4. NICM  Allergies  Allergen Reactions  . Sinus & Allergy [Chlorpheniramine-Phenylephrine] Shortness Of Breath  . Demerol Rash  . Penicillins Rash and Other (See Comments)    REACTION: rash, years ago Has patient had a PCN reaction causing immediate rash, facial/tongue/throat swelling, SOB or lightheadedness with hypotension: Yes Has patient had a PCN reaction causing severe rash involving mucus membranes or skin necrosis: No Has patient had a PCN reaction that required hospitalization No Has patient had a PCN reaction occurring within the last 10 years: No If all of the above answers are "NO", then may proceed with Cephalosporin use.      Procedures This Admission:  1.  Tikosyn loading 2.  Direct current cardioversion on 08/07/17 by Dr Debara Pickett which successfully restored SR.  There were no early apparent complications.   Brief HPI: [name] is a 84 y.o. female with a past medical history as noted above.  She was referred to EP in the outpatient setting for treatment options of atrial fibrillation.  Risks, benefits, and alternatives to Tikosyn were reviewed with the patient who wished to proceed.    Hospital Course:  The patient was admitted and Tikosyn was initiated.  Renal function and electrolytes were followed during the hospitalization.  Her QTc remained stable.  On 08/07/17 the patient underwent direct current cardioversion which restored SR.   She maintained SB 50's, 1st degree AVB and baseline LBBB, her Toprol dose was reduced.  She wasmonitored until discharge on telemetry which demonstrated SB 50-60's.  On the day of discharge, she is feeling well, was examined by Dr Curt Bears who considered the patient stable for discharge to home.  Follow-up has been arranged with the AFib clinic in 1 week and with Dr Curt Bears in 4 weeks.   Physical Exam: Vitals:   08/07/17 1720 08/07/17 2115 08/08/17 0601 08/08/17 1048  BP: 107/75 96/62 (!) 102/52 101/60  Pulse:  68 (!) 46 61  Resp:  17 15   Temp:  98.3 F (36.8 C) 98 F (36.7 C)   TempSrc:  Oral Oral   SpO2:  100% 99%   Weight:   193 lb 4.8 oz (87.7 kg)   Height:         Labs:   Lab Results  Component Value Date   WBC 7.0 07/23/2017   HGB 11.7 (L) 07/23/2017   HCT 37.4 07/23/2017   MCV 81.3 07/23/2017   PLT 284 07/23/2017    Recent Labs  Lab 08/08/17 0336  NA 139  K 4.1  CL 106  CO2 24  BUN 16  CREATININE 0.83  CALCIUM 9.1  GLUCOSE 84     Discharge Medications:  Allergies as of 08/08/2017      Reactions   Sinus & Allergy [chlorpheniramine-phenylephrine] Shortness Of Breath   Demerol Rash   Penicillins Rash, Other (See Comments)   REACTION: rash, years ago Has patient had a PCN reaction causing immediate rash, facial/tongue/throat swelling, SOB or lightheadedness with hypotension: Yes Has patient had a  PCN reaction causing severe rash involving mucus membranes or skin necrosis: No Has patient had a PCN reaction that required hospitalization No Has patient had a PCN reaction occurring within the last 10 years: No If all of the above answers are "NO", then may proceed with Cephalosporin use.      Medication List    STOP taking these medications   potassium chloride 10 MEQ tablet Commonly known as:  K-DUR Replaced by:  potassium chloride SA 20 MEQ tablet     TAKE these medications   acetaminophen 500 MG tablet Commonly known as:  TYLENOL Take 1,000 mg by  mouth every 6 (six) hours as needed for mild pain.   atorvastatin 20 MG tablet Commonly known as:  LIPITOR TAKE ONE TABLET BY MOUTH AT BEDTIME   balsalazide 750 MG capsule Commonly known as:  COLAZAL TAKE THREE CAPSULES BY MOUTH THREE TIMES DAILY (REPLACING DELZICOL)   dofetilide 250 MCG capsule Commonly known as:  TIKOSYN Take 1 capsule (250 mcg total) by mouth 2 (two) times daily.   furosemide 20 MG tablet Commonly known as:  LASIX Take 2 tablets (40 mg total) by mouth 2 (two) times daily. What changed:    when to take this  additional instructions   ICAPS AREDS 2 PO Take 2 tablets by mouth daily at 12 noon.   lisinopril 5 MG tablet Commonly known as:  PRINIVIL,ZESTRIL Take 1 tablet (5 mg total) by mouth daily.   metoprolol succinate 25 MG 24 hr tablet Commonly known as:  TOPROL-XL Take 1 tablet (25 mg total) by mouth daily. Start taking on:  08/09/2017 What changed:    medication strength  See the new instructions.   nitroGLYCERIN 0.4 MG SL tablet Commonly known as:  NITROSTAT Place 1 tablet (0.4 mg total) under the tongue every 5 (five) minutes as needed for chest pain.   potassium chloride SA 20 MEQ tablet Commonly known as:  K-DUR,KLOR-CON Take 1 tablet (20 mEq total) by mouth 3 (three) times daily. Replaces:  potassium chloride 10 MEQ tablet   rivaroxaban 20 MG Tabs tablet Commonly known as:  XARELTO Take 1 tablet (20 mg total) by mouth daily with supper. What changed:  when to take this Notes to patient:  OK to continue to take with breakfast if that is your largest meal of the day   THERATEARS ALLERGY 0.025 % ophthalmic solution Generic drug:  ketotifen Place 2 drops into both eyes 3 (three) times daily as needed (for dry eyes). Notes to patient:  Please confirm frequency with original prescribing doctor       Disposition:  Home  Discharge Instructions    Diet - low sodium heart healthy   Complete by:  As directed    Increase activity slowly    Complete by:  As directed      Follow-up Information    [facility] Follow up on 08/15/2017.   Specialty:  Cardiology Why:  1:30PM Contact information: [address] [id number] mc [address] [place] [address] [phone]       Constance Haw, MD Follow up on 09/10/2017.   Specialty:  Cardiology Why:  12:15PM Contact information: [place] [address] [phone]           Duration of Discharge Encounter: Greater than 30 minutes including physician time.  SignedTommye Standard, PA-C 08/08/2017 12:16 PM    I have seen and examined this patient with Tommye Standard.  Agree with above,  note added to reflect my findings.  On exam, RRR, no murmurs, lungs clear.  Admitted for dofetilide loading.  Loaded on 250 mcg twice a day.  Required cardioversion.  QTC has remained stable.  Plan for discharge with follow-up in clinic.  Lachanda Buczek M. Cameren Earnest MD 08/08/2017 2:11 PM

## 2017-08-08 NOTE — Discharge Instructions (Signed)
You have an appointment set up with the [facility].  Multiple studies have shown that being followed by a dedicated atrial fibrillation clinic in addition to the standard care you receive from your other physicians improves health. We believe that enrollment in the atrial fibrillation clinic will allow [place] to better care for you.   The phone number to the [facility] is [phone]. The clinic is staffed Monday through Friday from 8:30am to 5pm.  Parking Directions: The clinic is located in the Heart and Vascular Building connected to [facility]. 1)From [address] turn on to [address] and go to the 3rd entrance  (Heart and Vascular entrance) on the right. 2)Look to the right for Heart &Vascular Parking Garage. 3)A code for the entrance is required for August 2019 it is    1500.   4)Take the elevators to the 1st floor. Registration is in the room with the glass walls at the end of the hallway.  If you have any trouble parking or locating the clinic, please dont hesitate to call [phone].   Atrial Fibrillation Atrial fibrillation is a type of heartbeat that is irregular or fast (rapid). If you have this condition, your heart keeps quivering in a weird (chaotic) way. This condition can make it so your heart cannot pump blood normally. Having this condition gives a person more risk for stroke, heart failure, and other heart problems. There are different types of atrial fibrillation. Talk with your doctor to learn about the type that you have. Follow these instructions at home:  Take over-the-counter and prescription medicines only as told by your doctor.  If your doctor prescribed a blood-thinning medicine, take it exactly as told. Taking too much of it can cause bleeding. If you do not take enough of it, you will not have the protection that you need against stroke and other problems.  Do not use any tobacco products. These include cigarettes,  chewing tobacco, and e-cigarettes. If you need help quitting, ask your doctor.  If you have apnea (obstructive sleep apnea), manage it as told by your doctor.  Do not drink alcohol.  Do not drink beverages that have caffeine. These include coffee, soda, and tea.  Maintain a healthy weight. Do not use diet pills unless your doctor says they are safe for you. Diet pills may make heart problems worse.  Follow diet instructions as told by your doctor.  Exercise regularly as told by your doctor.  Keep all follow-up visits as told by your doctor. This is important. Contact a doctor if:  You notice a change in the speed, rhythm, or strength of your heartbeat.  You are taking a blood-thinning medicine and you notice more bruising.  You get tired more easily when you move or exercise. Get help right away if:  You have pain in your chest or your belly (abdomen).  You have sweating or weakness.  You feel sick to your stomach (nauseous).  You notice blood in your throw up (vomit), poop (stool), or pee (urine).  You are short of breath.  You suddenly have swollen feet and ankles.  You feel dizzy.  Your suddenly get weak or numb in your face, arms, or legs, especially if it happens on one side of your body.  You have trouble talking, trouble understanding, or both.  Your face or your eyelid droops on one side. These symptoms may be an emergency. Do not wait to see if the symptoms will go away. Get medical  help right away. Call your local emergency services (911 in the [place] Do not drive yourself to the hospital. This information is not intended to replace advice given to you by your health care provider. Make sure you discuss any questions you have with your health care provider. Document Released: 10/04/2007 Document Revised: 06/02/2015 Document Reviewed: 04/21/2014 Elsevier Interactive Patient Education  2018 [place] Heart-healthy meal  planning includes:  Limiting unhealthy fats.  Increasing healthy fats.  Making other small dietary changes.  You may need to talk with your doctor or a diet specialist (dietitian) to create an eating plan that is right for you. What types of fat should I choose?  Choose healthy fats. These include olive oil and canola oil, flaxseeds, walnuts, almonds, and seeds.  Eat more omega-3 fats. These include salmon, mackerel, sardines, tuna, flaxseed oil, and ground flaxseeds. Try to eat fish at least twice each week.  Limit saturated fats. ? Saturated fats are often found in animal products, such as meats, butter, and cream. ? Plant sources of saturated fats include palm oil, palm kernel oil, and coconut oil.  Avoid foods with partially hydrogenated oils in them. These include stick margarine, some tub margarines, cookies, crackers, and other baked goods. These contain trans fats. What general guidelines do I need to follow?  Check food labels carefully. Identify foods with trans fats or high amounts of saturated fat.  Fill one half of your plate with vegetables and green salads. Eat 4-5 servings of vegetables per day. A serving of vegetables is: ? 1 cup of raw leafy vegetables. ?  cup of raw or cooked cut-up vegetables. ?  cup of vegetable juice.  Fill one fourth of your plate with whole grains. Look for the word "whole" as the first word in the ingredient list.  Fill one fourth of your plate with lean protein foods.  Eat 4-5 servings of fruit per day. A serving of fruit is: ? One medium whole fruit. ?  cup of dried fruit. ?  cup of fresh, frozen, or canned fruit. ?  cup of 100% fruit juice.  Eat more foods that contain soluble fiber. These include apples, broccoli, carrots, beans, peas, and barley. Try to get 20-30 g of fiber per day.  Eat more home-cooked food. Eat less restaurant, buffet, and fast food.  Limit or avoid alcohol.  Limit foods high in starch and  sugar.  Avoid fried foods.  Avoid frying your food. Try baking, boiling, grilling, or broiling it instead. You can also reduce fat by: ? Removing the skin from poultry. ? Removing all visible fats from meats. ? Skimming the fat off of stews, soups, and gravies before serving them. ? Steaming vegetables in water or broth.  Lose weight if you are overweight.  Eat 4-5 servings of nuts, legumes, and seeds per week: ? One serving of dried beans or legumes equals  cup after being cooked. ? One serving of nuts equals 1 ounces. ? One serving of seeds equals  ounce or one tablespoon.  You may need to keep track of how much salt or sodium you eat. This is especially true if you have high blood pressure. Talk with your doctor or dietitian to get more information. What foods can I eat? Grains Breads, including [place], white, pita, wheat, raisin, rye, oatmeal, and [place]. Tortillas that are neither fried nor made with lard or trans fat. Low-fat rolls, including hotdog and hamburger buns and English muffins. Biscuits.  Muffins. Waffles. Pancakes. Light popcorn. Whole-grain cereals. Flatbread. Melba toast. Pretzels. Breadsticks. Rusks. Low-fat snacks. Low-fat crackers, including oyster, saltine, matzo, graham, animal, and rye. Rice and pasta, including brown rice and pastas that are made with whole wheat. Vegetables All vegetables. Fruits All fruits, but limit coconut. Meats and Other Protein Sources Lean, well-trimmed beef, veal, pork, and lamb. Chicken and [place] without skin. All fish and shellfish. Wild duck, rabbit, pheasant, and venison. Egg whites or low-cholesterol egg substitutes. Dried beans, peas, lentils, and tofu. Seeds and most nuts. Dairy Low-fat or nonfat cheeses, including ricotta, string, and mozzarella. Skim or 1% milk that is liquid, powdered, or evaporated. Buttermilk that is made with low-fat milk. Nonfat or low-fat yogurt. Beverages Mineral water. Diet carbonated  beverages. Sweets and Desserts Sherbets and fruit ices. Honey, jam, marmalade, jelly, and syrups. Meringues and gelatins. Pure sugar candy, such as hard candy, jelly beans, gumdrops, mints, marshmallows, and small amounts of dark chocolate. [employer] Eat all sweets and desserts in moderation. Fats and Oils Nonhydrogenated (trans-free) margarines. Vegetable oils, including soybean, sesame, sunflower, olive, peanut, safflower, corn, canola, and cottonseed. Salad dressings or mayonnaise made with a vegetable oil. Limit added fats and oils that you use for cooking, baking, salads, and as spreads. Other Cocoa powder. Coffee and tea. All seasonings and condiments. The items listed above may not be a complete list of recommended foods or beverages. Contact your dietitian for more options. What foods are not recommended? Grains Breads that are made with saturated or trans fats, oils, or whole milk. Croissants. Butter rolls. Cheese breads. Sweet rolls. Donuts. Buttered popcorn. Chow mein noodles. High-fat crackers, such as cheese or butter crackers. Meats and Other Protein Sources Fatty meats, such as hotdogs, short ribs, sausage, spareribs, bacon, rib eye roast or steak, and mutton. High-fat deli meats, such as salami and bologna. Caviar. Domestic duck and goose. Organ meats, such as kidney, liver, sweetbreads, and heart. Dairy Cream, sour cream, cream cheese, and creamed cottage cheese. Whole-milk cheeses, including blue (bleu), Monte[place] Jack, [place], [place], American, [place], Swiss, cheddar, [place], and [place]. Whole or 2% milk that is liquid, evaporated, or condensed. Whole buttermilk. Cream sauce or high-fat cheese sauce. Yogurt that is made from whole milk. Beverages Regular sodas and juice drinks with added sugar. Sweets and Desserts Frosting. Pudding. Cookies. Cakes other than angel food cake. Candy that has milk chocolate or white chocolate, hydrogenated fat, butter, coconut, or unknown  ingredients. Buttered syrups. Full-fat ice cream or ice cream drinks. Fats and Oils Gravy that has suet, meat fat, or shortening. Cocoa butter, hydrogenated oils, palm oil, coconut oil, palm kernel oil. These can often be found in baked products, candy, fried foods, nondairy creamers, and whipped toppings. Solid fats and shortenings, including bacon fat, salt pork, lard, and butter. Nondairy cream substitutes, such as coffee creamers and sour cream substitutes. Salad dressings that are made of unknown oils, cheese, or sour cream. The items listed above may not be a complete list of foods and beverages to avoid. Contact your dietitian for more information. This information is not intended to replace advice given to you by your health care provider. Make sure you discuss any questions you have with your health care provider. Document Released: 06/26/2011 Document Revised: 06/02/2015 Document Reviewed: 06/18/2013 Elsevier Interactive Patient Education  [employer].

## 2017-08-08 NOTE — Progress Notes (Addendum)
Progress Note  Patient Name: [name] Date of Encounter: 08/08/2017  Primary Cardiologist: Rozann Lesches, MD   Subjective   Happy to hear she was in SR, tolerating drug, no complaints, looking forward to going home  Inpatient Medications    Scheduled Meds: . atorvastatin  20 mg Oral QHS  . balsalazide  750 mg Oral TID AC  . dofetilide  250 mcg Oral BID  . furosemide  40 mg Oral BID  . ketotifen  2 drop Both Eyes BID  . lisinopril  5 mg Oral Daily  . metoprolol succinate  50 mg Oral Daily  . multivitamin  2 tablet Oral Daily  . potassium chloride  20 mEq Oral TID  . rivaroxaban  20 mg Oral Daily  . sodium chloride flush  3 mL Intravenous Q12H  . sodium chloride flush  3 mL Intravenous Q12H   Continuous Infusions: . sodium chloride    . sodium chloride     PRN Meds: sodium chloride, acetaminophen, feeding supplement (ENSURE ENLIVE), hydrocortisone cream, nitroGLYCERIN, sodium chloride flush, sodium chloride flush   Vital Signs    Vitals:   08/07/17 1520 08/07/17 1720 08/07/17 2115 08/08/17 0601  BP: 93/60 107/75 96/62 (!) 102/52  Pulse: (!) 58  68 (!) 46  Resp:   17 15  Temp: 97.8 F (36.6 C)  98.3 F (36.8 C) 98 F (36.7 C)  TempSrc: Oral  Oral Oral  SpO2: 95%  100% 99%  Weight:    193 lb 4.8 oz (87.7 kg)  Height:        Intake/Output Summary (Last 24 hours) at 08/08/2017 0752 Last data filed at 08/08/2017 0400 Gross per 24 hour  Intake 1020 ml  Output 1200 ml  Net -180 ml   Filed Weights   08/07/17 0434 08/07/17 1015 08/08/17 0601  Weight: 192 lb 6.4 oz (87.3 kg) 192 lb 6.4 oz (87.3 kg) 193 lb 4.8 oz (87.7 kg)    Telemetry    SB generally 50's, high 40's overnight intermittently, PACs, a couple brief PATs, one 3 beat NSVT (monomorphic) - Personally Reviewed  ECG    SB 50bpm, QTc (and corrected for LBBB) is 426 , LBBB QRS 133m, - Personally Reviewed  Physical Exam    GEN: No acute distress.   Neck: No JVD Cardiac: RRR, no murmurs,  rubs, or gallops.  Respiratory: CTA b/l. GI: Soft, nontender, non-distended  MS: No edema; No deformity. Neuro:  Nonfocal  Psych: Normal affect   Labs    Chemistry Recent Labs  Lab 08/06/17 0451 08/07/17 0532 08/08/17 0336  NA 141 138 139  K 4.1 3.9 4.1  CL 106 106 106  CO2 25 23 24   GLUCOSE 87 87 84  BUN 19 19 16   CREATININE 0.97 0.85 0.83  CALCIUM 9.2 9.0 9.1  GFRNONAA 52* >60 >60  GFRAA >60 >60 >60  ANIONGAP 10 9 9      HematologyNo results for input(s): WBC, RBC, HGB, HCT, MCV, MCH, MCHC, RDW, PLT in the last 168 hours.  Cardiac EnzymesNo results for input(s): TROPONINI in the last 168 hours. No results for input(s): TROPIPOC in the last 168 hours.   BNPNo results for input(s): BNP, PROBNP in the last 168 hours.   DDimer No results for input(s): DDIMER in the last 168 hours.   Radiology    No results found.  Cardiac Studies   05/03/17: LHC  Hemodynamic findings consistent with mild secondary pulmonary hypertension.  Patient has severe nonischemic cardiomyopathy  Moderate to Severely reduced CO/CI  LV end diastolic pressure is moderately elevated.  Mid RCA stent widely patent  There is mild (2+) mitral regurgitation.  Angiographically minimal CAD Moderate to Severely reduced CO/CI with severely reduced EF on Echo  The patient has severe nonischemic cardiomyopathy with reduced EF and reduced output   04/25/17: TTE Study Conclusions - Left ventricle: The cavity size was mildly to moderately dilated. Wall thickness was normal. The estimated ejection fraction was approximately 25%. Diffuse hypokinesis. There is akinesis of the anteroseptal myocardium. There is akinesis of the basal-midinferior myocardium. Indeterminate diastolic function. - Ventricular septum: Septal motion showed abnormal function and dyssynergy. - Aortic valve: Mildly to moderately calcified annulus. Trileaflet. - Mitral valve: Mildly calcified annulus. Mildly  thickened leaflets . There was moderate regurgitation. - Left atrium: The atrium was severely dilated. (75m) - Right ventricle: Systolic function was mildly reduced. - Right atrium: The atrium was at the upper limits of normal in size. Central venous pressure (est): 3 mm Hg. - Atrial septum: No defect or patent foramen ovale was identified. - Tricuspid valve: There was mild regurgitation. - Pulmonary arteries: PA peak pressure: 30 mm Hg (S). - Pericardium, extracardiac: There was no pericardial effusion.    Patient Profile     84y.o. female with a history of CAD (s/p BMS to RCA in 2014, patent stent by cath in 04/2017), UC, macular degeneration, bladder ca, depression, HTN, anemia (s/p underwent EGD June 2019 which was unremarkable and capsule study where GI bleeding was felt likely due to small bowel ulcerand discontinuation of aspirin was recommended along with resuming anticoagulant in 3 days, NICM, and persistent AFib, admitted for Tikosyn initiation  Assessment & Plan    1. Persistent Afib     CHA2DS2Vasc is 4, on Xarelto, appropriately dosed at 274mdaily     K+ 4.1     Mag 2.2     Creat 0.83 (baseline Creat more along 0.9-1.0 range, will not up-titrate dose, d/w Dr. CaCurt Bears    QTc is stable,       Anticipate this afternoon  2. CAD     No anginal complaints     Continue home regime, BB/statin, no ASA given [place] 3. HTN     Continue home meds  4. NICM     No symptoms or exam findings to suggest fluid OL     Continue home diuretic regime     Lytes and renal function are stable      For questions or updates, please contact [facility] consult [email or web address] for contact info under Cardiology/STEMI.      Signed, ReBaldwin JamaicaPA-C  08/08/2017, 7:52 AM    I have seen and examined this patient with ReTommye Standard Agree with above, note added to reflect my findings.  On exam, RRR, no murmurs, lungs clear.   patient had cardioversion yesterday and  remains in sinus rhythm today.  QTC remains stable.  QTC is stable after final EKG today, will plan for discharge.  [name] MD 08/08/2017 8:57 AM

## 2017-08-12 ENCOUNTER — Telehealth: Payer: Self-pay | Admitting: Cardiology

## 2017-08-12 ENCOUNTER — Ambulatory Visit (HOSPITAL_COMMUNITY)
Admission: RE | Admit: 2017-08-12 | Discharge: 2017-08-12 | Disposition: A | Discharge status: Home / Self Care | Payer: Medicare Other | Source: Ambulatory Visit | Attending: Nurse Practitioner | Admitting: Nurse Practitioner

## 2017-08-12 ENCOUNTER — Encounter (HOSPITAL_COMMUNITY): Payer: Self-pay | Admitting: Nurse Practitioner

## 2017-08-12 VITALS — BP 98/66 | HR 120 | Ht 63.0 in | Wt 194.0 lb

## 2017-08-12 DIAGNOSIS — Z8249 Family history of ischemic heart disease and other diseases of the circulatory system: Secondary | ICD-10-CM | POA: Diagnosis not present

## 2017-08-12 DIAGNOSIS — E785 Hyperlipidemia, unspecified: Secondary | ICD-10-CM | POA: Insufficient documentation

## 2017-08-12 DIAGNOSIS — Z880 Allergy status to penicillin: Secondary | ICD-10-CM | POA: Diagnosis not present

## 2017-08-12 DIAGNOSIS — I2510 Atherosclerotic heart disease of native coronary artery without angina pectoris: Secondary | ICD-10-CM | POA: Diagnosis not present

## 2017-08-12 DIAGNOSIS — I447 Left bundle-branch block, unspecified: Secondary | ICD-10-CM | POA: Diagnosis not present

## 2017-08-12 DIAGNOSIS — Z955 Presence of coronary angioplasty implant and graft: Secondary | ICD-10-CM | POA: Diagnosis not present

## 2017-08-12 DIAGNOSIS — Z79899 Other long term (current) drug therapy: Secondary | ICD-10-CM | POA: Insufficient documentation

## 2017-08-12 DIAGNOSIS — I4892 Unspecified atrial flutter: Secondary | ICD-10-CM | POA: Diagnosis not present

## 2017-08-12 DIAGNOSIS — I481 Persistent atrial fibrillation: Secondary | ICD-10-CM | POA: Diagnosis not present

## 2017-08-12 DIAGNOSIS — Z8551 Personal history of malignant neoplasm of bladder: Secondary | ICD-10-CM | POA: Insufficient documentation

## 2017-08-12 DIAGNOSIS — I10 Essential (primary) hypertension: Secondary | ICD-10-CM | POA: Insufficient documentation

## 2017-08-12 DIAGNOSIS — I4819 Other persistent atrial fibrillation: Secondary | ICD-10-CM

## 2017-08-12 DIAGNOSIS — Z7901 Long term (current) use of anticoagulants: Secondary | ICD-10-CM | POA: Diagnosis not present

## 2017-08-12 DIAGNOSIS — F329 Major depressive disorder, single episode, unspecified: Secondary | ICD-10-CM | POA: Insufficient documentation

## 2017-08-12 DIAGNOSIS — I428 Other cardiomyopathies: Secondary | ICD-10-CM | POA: Insufficient documentation

## 2017-08-12 DIAGNOSIS — D62 Acute posthemorrhagic anemia: Secondary | ICD-10-CM | POA: Insufficient documentation

## 2017-08-12 DIAGNOSIS — I480 Paroxysmal atrial fibrillation: Secondary | ICD-10-CM | POA: Diagnosis present

## 2017-08-12 DIAGNOSIS — H3530 Unspecified macular degeneration: Secondary | ICD-10-CM | POA: Insufficient documentation

## 2017-08-12 MED ORDER — METOPROLOL SUCCINATE ER 25 MG PO TB24: 25.0000 mg | ORAL_TABLET | Freq: Two times a day (BID) | ORAL | 6 refills | Status: DC

## 2017-08-12 NOTE — Telephone Encounter (Addendum)
Patient calls in reporting racing heart and SOB since Saturday. Tikosyn loading last week (in hospital). Pt states that since Saturday she is experiencing SOB. Yesterday she noticed heart racing. Denies any fluid/weight gain.  States her weight has remained the same. Appt made to see Orson Eva, NP in the AFib clinic this afternoon for EKG and determination of continuing Tikosyn. Pt agreeable to plan.

## 2017-08-12 NOTE — Patient Instructions (Signed)
Metoprolol 45m twice a day

## 2017-08-12 NOTE — Progress Notes (Signed)
Primary Care Physician: Rory Percy, MD Referring Physician: [name] is a 84 y.o. female with  history of CAD status post RCA stent in 2014 paroxysmal atrial fibrillation failed cardioversion on amiodarone and nonischemic cardiomyopathy EF 25% and with recent cath showing widely patent RCA stent 2+ MR and severe nonischemic cardiomyopathy.  She is being followed by Dr. Curt Bears. She was not felt to be a good candidate for ablation.  She was referred to the A. fib clinic for dofetilide loading but she had been on amiodarone and was waiting for  amiodarone level to return a low enough level to start Tikosyn. Labs in June showed amio level at 0.2. But then 6/5, she had a GI bleed and xarelto was held and had to wait until back on drug x 3 weeks without interruption.  CHA2DS2-VASc equals 4.  She is in the afib clinic today for tikosyn admit. She does have a LBBB, qtc on EKG at 523 ms, corrected Dr. Curt Bears, thinks around 68 mc, thinks she is OK to continue with plans for admission. No benadryl use.No missed doses of  xarelto for at least 3 weeks.Drugs reviewed by pharmacist and no qtc prolonging drugs on board.  F/u 8/5. Pt asked to be seen for heart racing since this weekend. EKG shows afib with RVR at 120 bpm. Pt feels weak out of rhythm. Weight is stable. BB was decreased from 50 mg bid to 25 mg daily prior to discharge for brady in SR seen after cardioversion   Today, she denies symptoms of palpitations, chest pain, shortness of breath, orthopnea, PND, lower extremity edema, dizziness, presyncope, syncope, or neurologic sequela. The patient is tolerating medications without difficulties and is otherwise without complaint today.   Past Medical History:  Diagnosis Date  . Acute blood loss anemia 06/2017  . Arthritis   . Atrial flutter ([place])   . Bladder cancer ([place])   . Coronary atherosclerosis of native coronary artery    a. BMS RCA May 2014 - [place] stent. b.  Cath 04/2017 - patent stent, minimal CAD otherwise.  . Depression   . Essential hypertension, benign   . GI bleeding 06/2017   a. melena/small bowel ulcer by capsule endo 06/2017.  Marland Kitchen Hyperlipemia   . Macular degeneration   . NICM (nonischemic cardiomyopathy) ([place])    a. EF fluctuating over the years in the 30s,40s, but most recently 25% in 04/2017.  Marland Kitchen Persistent atrial fibrillation ([place])   . Ulcerative colitis    Past Surgical History:  Procedure Laterality Date  . ABDOMINAL HYSTERECTOMY    . APPENDECTOMY    . Bilateral knee replacements      2007, 2008  . BLADDER SURGERY    . CARDIOVERSION N/A 02/04/2017   Procedure: CARDIOVERSION;  Surgeon: Arnoldo Lenis, MD;  [facility];  Service: Endoscopy;  Laterality: N/A;  . CARDIOVERSION N/A 02/26/2017   Procedure: CARDIOVERSION;  Surgeon: Satira Sark, MD;  [facility];  Service: Cardiovascular;  Laterality: N/A;  . CARDIOVERSION N/A 08/07/2017   Procedure: CARDIOVERSION;  Surgeon: Pixie Casino, MD;  Location: [place];  Service: Cardiovascular;  Laterality: N/A;  . COLONOSCOPY N/A 06/15/2015   Procedure: COLONOSCOPY;  Surgeon: Rogene [place], MD;  [facility];  Service: Endoscopy;  Laterality: N/A;  210  . CYSTOSCOPY W/ RETROGRADES Bilateral 05/14/2016   Procedure: CYSTOSCOPY WITH BILATERAL RETROGRADE PYELOGRAM;  Surgeon: Raynelle Bring, MD;  [facility];  Service: Urology;  Laterality: Bilateral;  GENERAL ANESTHESIA WITH PARALYSIS  . ESOPHAGOGASTRODUODENOSCOPY (EGD) WITH PROPOFOL N/A 06/14/2017   Procedure: ESOPHAGOGASTRODUODENOSCOPY (EGD) WITH PROPOFOL;  Surgeon: Rogene [place], MD;  [facility];  Service: Endoscopy;  Laterality: N/A;  . GIVENS CAPSULE STUDY  06/14/2017   Procedure: GIVENS CAPSULE STUDY;  Surgeon: Rogene Houston, MD;  [facility];  Service: Endoscopy;;  . LEFT HEART CATHETERIZATION WITH CORONARY ANGIOGRAM N/A 10/30/2013   Procedure: LEFT HEART  CATHETERIZATION WITH CORONARY ANGIOGRAM;  Surgeon: Burnell Blanks, MD;  Location: [name];  Service: Cardiovascular;  Laterality: N/A;  . RIGHT/LEFT HEART CATH AND CORONARY ANGIOGRAPHY N/A 05/03/2017   Procedure: RIGHT/LEFT HEART CATH AND CORONARY ANGIOGRAPHY;  Surgeon: Leonie Man, MD;  Location: [place] [facility];  Service: Cardiovascular;  Laterality: N/A;  . TEE WITHOUT CARDIOVERSION N/A 02/04/2017   Procedure: TRANSESOPHAGEAL ECHOCARDIOGRAM (TEE) WITH PROPOL;  Surgeon: Arnoldo Lenis, MD;  [facility];  Service: Endoscopy;  Laterality: N/A;  . TONSILLECTOMY    . TOTAL KNEE ARTHROPLASTY    . TRANSURETHRAL RESECTION OF BLADDER TUMOR N/A 05/14/2016   Procedure: TRANSURETHRAL RESECTION OF BLADDER TUMOR (TURBT);  Surgeon: Raynelle Bring, MD;  [facility];  Service: Urology;  Laterality: N/A;  GENERAL ANESTHESIA WITH PARALYSIS  . YAG LASER APPLICATION Bilateral 82/04/2351   Procedure: YAG LASER APPLICATION;  Surgeon: Williams Che, MD;  [facility];  Service: Ophthalmology;  Laterality: Bilateral;    Current Outpatient Medications  Medication Sig Dispense Refill  . acetaminophen (TYLENOL) 500 MG tablet Take 1,000 mg by mouth every 6 (six) hours as needed for mild pain.     Marland Kitchen atorvastatin (LIPITOR) 20 MG tablet TAKE ONE TABLET BY MOUTH AT BEDTIME 30 tablet 3  . balsalazide (COLAZAL) 750 MG capsule TAKE THREE CAPSULES BY MOUTH THREE TIMES DAILY (REPLACING DELZICOL) 270 capsule 3  . dofetilide (TIKOSYN) 250 MCG capsule Take 1 capsule (250 mcg total) by mouth 2 (two) times daily. 60 capsule 3  . furosemide (LASIX) 20 MG tablet Take 2 tablets (40 mg total) by mouth 2 (two) times daily. (Patient taking differently: Take 40 mg by mouth See admin instructions. 32m in the morning and 479mat 1500) 180 tablet 3  . ketotifen (THERATEARS ALLERGY) 0.025 % ophthalmic solution Place 2 drops into both eyes 3 (three) times daily as needed (for dry eyes).     . Marland Kitchenisinopril  (PRINIVIL,ZESTRIL) 5 MG tablet Take 1 tablet (5 mg total) by mouth daily. 30 tablet 6  . metoprolol succinate (TOPROL-XL) 25 MG 24 hr tablet Take 1 tablet (25 mg total) by mouth 2 (two) times daily. 60 tablet 6  . Multiple Vitamins-Minerals (ICAPS AREDS 2 PO) Take 2 tablets by mouth daily at 12 noon.     . nitroGLYCERIN (NITROSTAT) 0.4 MG SL tablet Place 1 tablet (0.4 mg total) under the tongue every 5 (five) minutes as needed for chest pain. 25 tablet 3  . potassium chloride (K-DUR,KLOR-CON) 20 MEQ tablet Take 1 tablet (20 mEq total) by mouth 3 (three) times daily. 90 tablet 6  . rivaroxaban (XARELTO) 20 MG TABS tablet Take 1 tablet (20 mg total) by mouth daily with supper. (Patient taking differently: Take 20 mg by mouth every morning. ) 30 tablet 6   No current facility-administered medications for this encounter.     Allergies  Allergen Reactions  . Sinus & Allergy [Chlorpheniramine-Phenylephrine] Shortness Of Breath  . Demerol Rash  . Penicillins Rash and Other (See  Comments)    REACTION: rash, years ago Has patient had a PCN reaction causing immediate rash, facial/tongue/throat swelling, SOB or lightheadedness with hypotension: Yes Has patient had a PCN reaction causing severe rash involving mucus membranes or skin necrosis: No Has patient had a PCN reaction that required hospitalization No Has patient had a PCN reaction occurring within the last 10 years: No If all of the above answers are "NO", then may proceed with Cephalosporin use.     Social History   Socioeconomic History  . Marital status: Married    Spouse name: Not on file  . Number of children: Not on file  . Years of education: Not on file  . Highest education level: Not on file  Occupational History  . Not on file  Social Needs  . Financial resource strain: Not on file  . Food insecurity:    Worry: Not on file    Inability: Not on file  . Transportation needs:    Medical: Not on file    Non-medical: Not on  file  Tobacco Use  . Smoking status: Never Smoker  . Smokeless tobacco: Never Used  Substance and Sexual Activity  . Alcohol use: No    Alcohol/week: 0.0 oz  . Drug use: No  . Sexual activity: Not Currently  Lifestyle  . Physical activity:    Days per week: Not on file    Minutes per session: Not on file  . Stress: Not on file  Relationships  . Social connections:    Talks on phone: Not on file    Gets together: Not on file    Attends religious service: Not on file    Active member of club or organization: Not on file    Attends meetings of clubs or organizations: Not on file    Relationship status: Not on file  . Intimate partner violence:    Fear of current or ex partner: Not on file    Emotionally abused: Not on file    Physically abused: Not on file    Forced sexual activity: Not on file  Other Topics Concern  . Not on file  Social History Narrative  . Not on file    Family History  Problem Relation Age of Onset  . CAD Father     ROS- All systems are reviewed and negative except as per the HPI above  Physical Exam: Vitals:   08/12/17 1502  BP: 98/66  Pulse: (!) 120  SpO2: 99%  Weight: 194 lb (88 kg)  Height: 5' 3"  (1.6 m)   Wt Readings from Last 3 Encounters:  08/12/17 194 lb (88 kg)  08/08/17 193 lb 4.8 oz (87.7 kg)  08/05/17 196 lb (88.9 kg)    Labs: Lab Results  Component Value Date   NA 139 08/08/2017   K 4.1 08/08/2017   CL 106 08/08/2017   CO2 24 08/08/2017   GLUCOSE 84 08/08/2017   BUN 16 08/08/2017   CREATININE 0.83 08/08/2017   CALCIUM 9.1 08/08/2017   MG 2.2 08/08/2017   Lab Results  Component Value Date   INR 1.50 06/14/2017   No results found for: CHOL, HDL, LDLCALC, TRIG   GEN- The patient is well appearing, alert and oriented x 3 today.   Head- normocephalic, atraumatic Eyes-  Sclera clear, conjunctiva pink Ears- hearing intact Oropharynx- clear Neck- supple, no JVP Lymph- no cervical lymphadenopathy Lungs- Clear to  ausculation bilaterally, normal work of breathing Heart-irregular rate and rhythm, no murmurs, rubs  or gallops, PMI not laterally displaced GI- soft, NT, ND, + BS Extremities- no clubbing, cyanosis, or edema MS- no significant deformity or atrophy Skin- no rash or lesion Psych- euthymic mood, full affect Neuro- strength and sensation are intact  EKG-afib with LBBB, at 120 bpm, qtc interval at 472 ms   Assessment and Plan: 1. Persistent afib Left the [name] in SR but has now returned to afib with RVR Will increase metoprolol to 25 mg bid, form 25 mg daily ( prior dose was 50 mg bid) Continue Tikosyn at 250 mcg bid  Continue xarelto at 25 mg daily  Return on Thursday , if still in afib, will order another cardioversion before saying tikosyn was ineffective  [name], [name] [address] [place], [place] [address] [phone]

## 2017-08-12 NOTE — Telephone Encounter (Signed)
New Message    Pt c/o medication issue:  1. Name of Medication: dofetilide (TIKOSYN) 250 MCG capsule  2. How are you currently taking this medication (dosage and times per day)? Take 1 capsule (250 mcg total) by mouth 2 (two) times daily.  3. Are you having a reaction (difficulty breathing--STAT)? Shortness of breath  4. What is your medication issue? Patient is calling because she does not believe the Tikosyn is working because she is experiencing some shortness of breath. She said she feel likes her heart is racing

## 2017-08-15 ENCOUNTER — Ambulatory Visit (HOSPITAL_COMMUNITY)
Admission: RE | Admit: 2017-08-15 | Discharge: 2017-08-15 | Disposition: A | Discharge status: Home / Self Care | Payer: Medicare Other | Source: Ambulatory Visit | Attending: Nurse Practitioner | Admitting: Nurse Practitioner

## 2017-08-15 ENCOUNTER — Encounter (HOSPITAL_COMMUNITY): Payer: Self-pay | Admitting: Nurse Practitioner

## 2017-08-15 VITALS — BP 116/62 | HR 59 | Ht 63.0 in | Wt 193.6 lb

## 2017-08-15 DIAGNOSIS — I4891 Unspecified atrial fibrillation: Secondary | ICD-10-CM | POA: Diagnosis present

## 2017-08-15 DIAGNOSIS — I481 Persistent atrial fibrillation: Secondary | ICD-10-CM

## 2017-08-15 DIAGNOSIS — Z8551 Personal history of malignant neoplasm of bladder: Secondary | ICD-10-CM | POA: Insufficient documentation

## 2017-08-15 DIAGNOSIS — I428 Other cardiomyopathies: Secondary | ICD-10-CM | POA: Insufficient documentation

## 2017-08-15 DIAGNOSIS — E785 Hyperlipidemia, unspecified: Secondary | ICD-10-CM | POA: Diagnosis not present

## 2017-08-15 DIAGNOSIS — Z7901 Long term (current) use of anticoagulants: Secondary | ICD-10-CM | POA: Diagnosis not present

## 2017-08-15 DIAGNOSIS — I480 Paroxysmal atrial fibrillation: Secondary | ICD-10-CM | POA: Diagnosis not present

## 2017-08-15 DIAGNOSIS — I2510 Atherosclerotic heart disease of native coronary artery without angina pectoris: Secondary | ICD-10-CM | POA: Insufficient documentation

## 2017-08-15 DIAGNOSIS — I440 Atrioventricular block, first degree: Secondary | ICD-10-CM | POA: Insufficient documentation

## 2017-08-15 DIAGNOSIS — Z79899 Other long term (current) drug therapy: Secondary | ICD-10-CM | POA: Diagnosis not present

## 2017-08-15 DIAGNOSIS — I10 Essential (primary) hypertension: Secondary | ICD-10-CM | POA: Insufficient documentation

## 2017-08-15 DIAGNOSIS — Z8249 Family history of ischemic heart disease and other diseases of the circulatory system: Secondary | ICD-10-CM | POA: Diagnosis not present

## 2017-08-15 DIAGNOSIS — F329 Major depressive disorder, single episode, unspecified: Secondary | ICD-10-CM | POA: Insufficient documentation

## 2017-08-15 DIAGNOSIS — I4892 Unspecified atrial flutter: Secondary | ICD-10-CM | POA: Insufficient documentation

## 2017-08-15 DIAGNOSIS — I4819 Other persistent atrial fibrillation: Secondary | ICD-10-CM

## 2017-08-15 DIAGNOSIS — I447 Left bundle-branch block, unspecified: Secondary | ICD-10-CM | POA: Insufficient documentation

## 2017-08-15 DIAGNOSIS — Z880 Allergy status to penicillin: Secondary | ICD-10-CM | POA: Insufficient documentation

## 2017-08-15 DIAGNOSIS — Z96653 Presence of artificial knee joint, bilateral: Secondary | ICD-10-CM | POA: Diagnosis not present

## 2017-08-15 DIAGNOSIS — Z885 Allergy status to narcotic agent status: Secondary | ICD-10-CM | POA: Insufficient documentation

## 2017-08-15 LAB — BASIC METABOLIC PANEL
ANION GAP: 9 (ref 5–15)
BUN: 14 mg/dL (ref 8–23)
CHLORIDE: 104 mmol/L (ref 98–111)
CO2: 26 mmol/L (ref 22–32)
Calcium: 9.3 mg/dL (ref 8.9–10.3)
Creatinine, Ser: 0.83 mg/dL (ref 0.44–1.00)
GFR calc Af Amer: >60 mL/min (ref >60)
Glucose, Bld: 86 mg/dL (ref 70–99)
POTASSIUM: 4.3 mmol/L (ref 3.5–5.1)
SODIUM: 139 mmol/L (ref 135–145)

## 2017-08-15 LAB — MAGNESIUM: MAGNESIUM: 2.1 mg/dL (ref 1.7–2.4)

## 2017-08-15 NOTE — Progress Notes (Signed)
Primary Care Physician: Rory Percy, MD Referring Physician: [name] is a 84 y.o. female with  history of CAD status post RCA stent in 2014 paroxysmal atrial fibrillation failed cardioversion on amiodarone and nonischemic cardiomyopathy EF 25% and with recent cath showing widely patent RCA stent 2+ MR and severe nonischemic cardiomyopathy.  She is being followed by Dr. Curt Bears. She was not felt to be a good candidate for ablation.  She was referred to the A. fib clinic for dofetilide loading but she had been on amiodarone and was waiting for  amiodarone level to return a low enough level to start Tikosyn. Labs in June showed amio level at 0.2. But then 6/5, she had a GI bleed and xarelto was held and had to wait until back on drug x 3 weeks without interruption.  CHA2DS2-VASc equals 4.  She is in the afib clinic today for tikosyn admit. She does have a LBBB, qtc on EKG at 523 ms, corrected Dr. Curt Bears, thinks around 20 mc, thinks she is OK to continue with plans for admission. No benadryl use.No missed doses of  xarelto for at least 3 weeks.Drugs reviewed by pharmacist and no qtc prolonging drugs on board.  F/u 8/5. Pt asked to be seen for heart racing since this weekend. EKG shows afib with RVR at 120 bpm. Pt feels weak out of rhythm. Weight is stable. BB was decreased from 50 mg bid to 25 mg daily prior to discharge for brady  seen after cardioversion.  F/u in afib clinic, 8/8. Pt returned to [facility] soon after increasing BB. She is in SR at 59 bpm today.    Today, she denies symptoms of palpitations, chest pain, shortness of breath, orthopnea, PND, lower extremity edema, dizziness, presyncope, syncope, or neurologic sequela. The patient is tolerating medications without difficulties and is otherwise without complaint today.   Past Medical History:  Diagnosis Date  . Acute blood loss anemia 06/2017  . Arthritis   . Atrial flutter ([place])   . Bladder cancer ([place])   .  Coronary atherosclerosis of native coronary artery    a. BMS RCA May 2014 - [place] stent. b. Cath 04/2017 - patent stent, minimal CAD otherwise.  . Depression   . Essential hypertension, benign   . GI bleeding 06/2017   a. melena/small bowel ulcer by capsule endo 06/2017.  Marland Kitchen Hyperlipemia   . Macular degeneration   . NICM (nonischemic cardiomyopathy) ([place])    a. EF fluctuating over the years in the 30s,40s, but most recently 25% in 04/2017.  Marland Kitchen Persistent atrial fibrillation ([place])   . Ulcerative colitis    Past Surgical History:  Procedure Laterality Date  . ABDOMINAL HYSTERECTOMY    . APPENDECTOMY    . Bilateral knee replacements      2007, 2008  . BLADDER SURGERY    . CARDIOVERSION N/A 02/04/2017   Procedure: CARDIOVERSION;  Surgeon: Arnoldo Lenis, MD;  [facility];  Service: Endoscopy;  Laterality: N/A;  . CARDIOVERSION N/A 02/26/2017   Procedure: CARDIOVERSION;  Surgeon: Satira Sark, MD;  [facility];  Service: Cardiovascular;  Laterality: N/A;  . CARDIOVERSION N/A 08/07/2017   Procedure: CARDIOVERSION;  Surgeon: Pixie Casino, MD;  Location: [place];  Service: Cardiovascular;  Laterality: N/A;  . COLONOSCOPY N/A 06/15/2015   Procedure: COLONOSCOPY;  Surgeon: Rogene [place], MD;  [facility];  Service: Endoscopy;  Laterality: N/A;  210  . CYSTOSCOPY W/ RETROGRADES Bilateral  05/14/2016   Procedure: CYSTOSCOPY WITH BILATERAL RETROGRADE PYELOGRAM;  Surgeon: Raynelle Bring, MD;  [facility];  Service: Urology;  Laterality: Bilateral;  GENERAL ANESTHESIA WITH PARALYSIS  . ESOPHAGOGASTRODUODENOSCOPY (EGD) WITH PROPOFOL N/A 06/14/2017   Procedure: ESOPHAGOGASTRODUODENOSCOPY (EGD) WITH PROPOFOL;  Surgeon: Rogene Houston, MD;  [facility];  Service: Endoscopy;  Laterality: N/A;  . GIVENS CAPSULE STUDY  06/14/2017   Procedure: GIVENS CAPSULE STUDY;  Surgeon: Rogene [place], MD;  [facility];  Service: Endoscopy;;  .  LEFT HEART CATHETERIZATION WITH CORONARY ANGIOGRAM N/A 10/30/2013   Procedure: LEFT HEART CATHETERIZATION WITH CORONARY ANGIOGRAM;  Surgeon: Burnell Blanks, MD;  Location: [facility] CATH [facility];  Service: Cardiovascular;  Laterality: N/A;  . RIGHT/LEFT HEART CATH AND CORONARY ANGIOGRAPHY N/A 05/03/2017   Procedure: RIGHT/LEFT HEART CATH AND CORONARY ANGIOGRAPHY;  Surgeon: Leonie Man, MD;  Location: [place] [facility] LAB;  Service: Cardiovascular;  Laterality: N/A;  . TEE WITHOUT CARDIOVERSION N/A 02/04/2017   Procedure: TRANSESOPHAGEAL ECHOCARDIOGRAM (TEE) WITH PROPOL;  Surgeon: Arnoldo Lenis, MD;  [facility];  Service: Endoscopy;  Laterality: N/A;  . TONSILLECTOMY    . TOTAL KNEE ARTHROPLASTY    . TRANSURETHRAL RESECTION OF BLADDER TUMOR N/A 05/14/2016   Procedure: TRANSURETHRAL RESECTION OF BLADDER TUMOR (TURBT);  Surgeon: Raynelle Bring, MD;  [facility];  Service: Urology;  Laterality: N/A;  GENERAL ANESTHESIA WITH PARALYSIS  . YAG LASER APPLICATION Bilateral 65/06/8125   Procedure: YAG LASER APPLICATION;  Surgeon: Williams Che, MD;  [facility];  Service: Ophthalmology;  Laterality: Bilateral;    Current Outpatient Medications  Medication Sig Dispense Refill  . acetaminophen (TYLENOL) 500 MG tablet Take 1,000 mg by mouth every 6 (six) hours as needed for mild pain.     Marland Kitchen atorvastatin (LIPITOR) 20 MG tablet TAKE ONE TABLET BY MOUTH AT BEDTIME 30 tablet 3  . balsalazide (COLAZAL) 750 MG capsule TAKE THREE CAPSULES BY MOUTH THREE TIMES DAILY (REPLACING DELZICOL) 270 capsule 3  . dofetilide (TIKOSYN) 250 MCG capsule Take 1 capsule (250 mcg total) by mouth 2 (two) times daily. 60 capsule 3  . furosemide (LASIX) 20 MG tablet Take 2 tablets (40 mg total) by mouth 2 (two) times daily. (Patient taking differently: Take 40 mg by mouth See admin instructions. 21m in the morning and 480mat 1500) 180 tablet 3  . ketotifen (THERATEARS ALLERGY) 0.025 % ophthalmic solution  Place 2 drops into both eyes 3 (three) times daily as needed (for dry eyes).     . Marland Kitchenisinopril (PRINIVIL,ZESTRIL) 5 MG tablet Take 1 tablet (5 mg total) by mouth daily. 30 tablet 6  . metoprolol succinate (TOPROL-XL) 25 MG 24 hr tablet Take 1 tablet (25 mg total) by mouth 2 (two) times daily. 60 tablet 6  . Multiple Vitamins-Minerals (ICAPS AREDS 2 PO) Take 2 tablets by mouth daily at 12 noon.     . nitroGLYCERIN (NITROSTAT) 0.4 MG SL tablet Place 1 tablet (0.4 mg total) under the tongue every 5 (five) minutes as needed for chest pain. 25 tablet 3  . potassium chloride (K-DUR,KLOR-CON) 20 MEQ tablet Take 1 tablet (20 mEq total) by mouth 3 (three) times daily. 90 tablet 6  . rivaroxaban (XARELTO) 20 MG TABS tablet Take 1 tablet (20 mg total) by mouth daily with supper. (Patient taking differently: Take 20 mg by mouth every morning. ) 30 tablet 6   No current facility-administered medications for this encounter.     Allergies  Allergen Reactions  . Sinus & Allergy [Chlorpheniramine-Phenylephrine] Shortness Of Breath  . Demerol Rash  . Penicillins Rash and Other (See Comments)    REACTION: rash, years ago Has patient had a PCN reaction causing immediate rash, facial/tongue/throat swelling, SOB or lightheadedness with hypotension: Yes Has patient had a PCN reaction causing severe rash involving mucus membranes or skin necrosis: No Has patient had a PCN reaction that required hospitalization No Has patient had a PCN reaction occurring within the last 10 years: No If all of the above answers are "NO", then may proceed with Cephalosporin use.     Social History   Socioeconomic History  . Marital status: Married    Spouse name: Not on file  . Number of children: Not on file  . Years of education: Not on file  . Highest education level: Not on file  Occupational History  . Not on file  Social Needs  . Financial resource strain: Not on file  . Food insecurity:    Worry: Not on file     Inability: Not on file  . Transportation needs:    Medical: Not on file    Non-medical: Not on file  Tobacco Use  . Smoking status: Never Smoker  . Smokeless tobacco: Never Used  Substance and Sexual Activity  . Alcohol use: No    Alcohol/week: 0.0 standard drinks  . Drug use: No  . Sexual activity: Not Currently  Lifestyle  . Physical activity:    Days per week: Not on file    Minutes per session: Not on file  . Stress: Not on file  Relationships  . Social connections:    Talks on phone: Not on file    Gets together: Not on file    Attends religious service: Not on file    Active member of club or organization: Not on file    Attends meetings of clubs or organizations: Not on file    Relationship status: Not on file  . Intimate partner violence:    Fear of current or ex partner: Not on file    Emotionally abused: Not on file    Physically abused: Not on file    Forced sexual activity: Not on file  Other Topics Concern  . Not on file  Social History Narrative  . Not on file    Family History  Problem Relation Age of Onset  . CAD Father     ROS- All systems are reviewed and negative except as per the HPI above  Physical Exam: Vitals:   08/15/17 1336  BP: 116/62  Pulse: (!) 59  Weight: 87.8 kg  Height: 5' 3"  (1.6 m)   Wt Readings from Last 3 Encounters:  08/15/17 87.8 kg  08/12/17 88 kg  08/08/17 87.7 kg    Labs: Lab Results  Component Value Date   NA 139 08/08/2017   K 4.1 08/08/2017   CL 106 08/08/2017   CO2 24 08/08/2017   GLUCOSE 84 08/08/2017   BUN 16 08/08/2017   CREATININE 0.83 08/08/2017   CALCIUM 9.1 08/08/2017   MG 2.2 08/08/2017   Lab Results  Component Value Date   INR 1.50 06/14/2017   No results found for: CHOL, HDL, LDLCALC, TRIG   GEN- The patient is well appearing, alert and oriented x 3 today.   Head- normocephalic, atraumatic Eyes-  Sclera clear, conjunctiva pink Ears- hearing intact Oropharynx- clear Neck- supple, no  JVP Lymph- no cervical lymphadenopathy Lungs- Clear to ausculation bilaterally, normal  work of breathing Heart-irregular rate and rhythm, no murmurs, rubs or gallops, PMI not laterally displaced GI- soft, NT, ND, + BS Extremities- no clubbing, cyanosis, or edema MS- no significant deformity or atrophy Skin- no rash or lesion Psych- euthymic mood, full affect Neuro- strength and sensation are intact  EKG-afib with LBBB, at 120 bpm, qtc interval at 472 ms   Assessment and Plan: 1. Persistent afib Left the hospital in SR,  returned to afib with RVR, but resumed SR with increase of bb  Continue metoprolol  25 mg bid, from  25 mg daily ( prior dose was 50 mg bid) Continue Tikosyn at 250 mcg bid  Continue xarelto at 25 mg daily Bmet/mag today  F/u with Dr. Curt Bears 9/3  Geroge Baseman. Abrielle Finck, [facility] [address] [place], [place] [address] [phone]

## 2017-08-20 ENCOUNTER — Ambulatory Visit: Payer: Medicare Other | Admitting: Cardiology

## 2017-08-28 ENCOUNTER — Ambulatory Visit: Payer: Medicare Other | Admitting: Urology

## 2017-08-28 DIAGNOSIS — C674 Malignant neoplasm of posterior wall of bladder: Secondary | ICD-10-CM

## 2017-09-01 ENCOUNTER — Other Ambulatory Visit (INDEPENDENT_AMBULATORY_CARE_PROVIDER_SITE_OTHER): Payer: Self-pay | Admitting: Internal Medicine

## 2017-09-09 NOTE — Progress Notes (Signed)
Electrophysiology Office Note   Date:  09/10/2017   ID:  [name], [name] 04/02/1930, MRN [id number]  PCP:  Rory Percy, MD  Cardiologist:  Domenic Polite Primary Electrophysiologist:  Labaron Digirolamo Meredith Leeds, MD    No chief complaint on file.    History of Present Illness: [name] is a 84 y.o. female who is being seen today for the evaluation of atrial fibrillation at the request of Rory Percy, MD. Presenting today for electrophysiology evaluation.  She has a history of atrial flutter, coronary artery disease status post RCA stent in 2014, paroxysmal atrial fibrillation, and nonischemic cardiomyopathy.  She does have atrial fibrillation and is failed cardioversion and amiodarone.  Most recent echo showed an EF of 25% with catheterization showing a likely nonischemic cardiomyopathy.  She has weakness in class III dyspnea.  Today, denies symptoms of palpitations, chest pain, shortness of breath, orthopnea, PND, lower extremity edema, claudication, dizziness, presyncope, syncope, bleeding, or neurologic sequela. The patient is tolerating medications without difficulties.  Overall she is feeling well.  She has no major complaints.  She remains in sinus rhythm.   Past Medical History:  Diagnosis Date  . Acute blood loss anemia 06/2017  . Arthritis   . Atrial flutter ([place])   . Bladder cancer ([place])   . Coronary atherosclerosis of native coronary artery    a. BMS RCA May 2014 - [place] stent. b. Cath 04/2017 - patent stent, minimal CAD otherwise.  . Depression   . Essential hypertension, benign   . GI bleeding 06/2017   a. melena/small bowel ulcer by capsule endo 06/2017.  Marland Kitchen Hyperlipemia   . Macular degeneration   . NICM (nonischemic cardiomyopathy) ([place])    a. EF fluctuating over the years in the 30s,40s, but most recently 25% in 04/2017.  Marland Kitchen Persistent atrial fibrillation ([place])   . Ulcerative colitis    Past Surgical History:  Procedure Laterality Date  . ABDOMINAL  HYSTERECTOMY    . APPENDECTOMY    . Bilateral knee replacements      2007, 2008  . BLADDER SURGERY    . CARDIOVERSION N/A 02/04/2017   Procedure: CARDIOVERSION;  Surgeon: Arnoldo Lenis, MD;  [facility];  Service: Endoscopy;  Laterality: N/A;  . CARDIOVERSION N/A 02/26/2017   Procedure: CARDIOVERSION;  Surgeon: Satira Sark, MD;  [facility];  Service: Cardiovascular;  Laterality: N/A;  . CARDIOVERSION N/A 08/07/2017   Procedure: CARDIOVERSION;  Surgeon: Pixie Casino, MD;  Location: [facility];  Service: Cardiovascular;  Laterality: N/A;  . COLONOSCOPY N/A 06/15/2015   Procedure: COLONOSCOPY;  Surgeon: Rogene Houston, MD;  [facility];  Service: Endoscopy;  Laterality: N/A;  210  . CYSTOSCOPY W/ RETROGRADES Bilateral 05/14/2016   Procedure: CYSTOSCOPY WITH BILATERAL RETROGRADE PYELOGRAM;  Surgeon: Raynelle Bring, MD;  [facility];  Service: Urology;  Laterality: Bilateral;  GENERAL ANESTHESIA WITH PARALYSIS  . ESOPHAGOGASTRODUODENOSCOPY (EGD) WITH PROPOFOL N/A 06/14/2017   Procedure: ESOPHAGOGASTRODUODENOSCOPY (EGD) WITH PROPOFOL;  Surgeon: Rogene Houston, MD;  [facility];  Service: Endoscopy;  Laterality: N/A;  . GIVENS CAPSULE STUDY  06/14/2017   Procedure: GIVENS CAPSULE STUDY;  Surgeon: Rogene Houston, MD;  [facility];  Service: Endoscopy;;  . LEFT HEART CATHETERIZATION WITH CORONARY ANGIOGRAM N/A 10/30/2013   Procedure: LEFT HEART CATHETERIZATION WITH CORONARY ANGIOGRAM;  Surgeon: Burnell Blanks, MD;  Location: [facility] CATH LAB;  Service: Cardiovascular;  Laterality: N/A;  . RIGHT/LEFT HEART CATH AND CORONARY  ANGIOGRAPHY N/A 05/03/2017   Procedure: RIGHT/LEFT HEART CATH AND CORONARY ANGIOGRAPHY;  Surgeon: Leonie Man, MD;  Location: [facility] CV LAB;  Service: Cardiovascular;  Laterality: N/A;  . TEE WITHOUT CARDIOVERSION N/A 02/04/2017   Procedure: TRANSESOPHAGEAL ECHOCARDIOGRAM (TEE) WITH PROPOL;  Surgeon:  Arnoldo Lenis, MD;  [facility];  Service: Endoscopy;  Laterality: N/A;  . TONSILLECTOMY    . TOTAL KNEE ARTHROPLASTY    . TRANSURETHRAL RESECTION OF BLADDER TUMOR N/A 05/14/2016   Procedure: TRANSURETHRAL RESECTION OF BLADDER TUMOR (TURBT);  Surgeon: Raynelle Bring, MD;  [facility];  Service: Urology;  Laterality: N/A;  GENERAL ANESTHESIA WITH PARALYSIS  . YAG LASER APPLICATION Bilateral 46/05/352   Procedure: YAG LASER APPLICATION;  Surgeon: Williams Che, MD;  [facility];  Service: Ophthalmology;  Laterality: Bilateral;     Current Outpatient Medications  Medication Sig Dispense Refill  . acetaminophen (TYLENOL) 500 MG tablet Take 1,000 mg by mouth every 6 (six) hours as needed for mild pain.     Marland Kitchen atorvastatin (LIPITOR) 20 MG tablet TAKE ONE TABLET BY MOUTH AT BEDTIME 30 tablet 3  . balsalazide (COLAZAL) 750 MG capsule TAKE THREE CAPSULES BY MOUTH THREE TIMES DAILY 270 capsule 3  . dofetilide (TIKOSYN) 250 MCG capsule Take 1 capsule (250 mcg total) by mouth 2 (two) times daily. 60 capsule 3  . furosemide (LASIX) 20 MG tablet Take 2 tablets (40 mg total) by mouth 2 (two) times daily. (Patient taking differently: Take 40 mg by mouth See admin instructions. 69m in the morning and 475mat 1500) 180 tablet 3  . ketotifen (THERATEARS ALLERGY) 0.025 % ophthalmic solution Place 2 drops into both eyes 3 (three) times daily as needed (for dry eyes).     . metoprolol succinate (TOPROL-XL) 25 MG 24 hr tablet Take 1 tablet (25 mg total) by mouth 2 (two) times daily. 60 tablet 6  . Multiple Vitamins-Minerals (ICAPS AREDS 2 PO) Take 2 tablets by mouth daily at 12 noon.     . potassium chloride (K-DUR,KLOR-CON) 20 MEQ tablet Take 1 tablet (20 mEq total) by mouth 3 (three) times daily. 90 tablet 6  . rivaroxaban (XARELTO) 20 MG TABS tablet Take 1 tablet (20 mg total) by mouth daily with supper. (Patient taking differently: Take 20 mg by mouth every morning. ) 30 tablet 6  .  lisinopril (PRINIVIL,ZESTRIL) 5 MG tablet Take 1 tablet (5 mg total) by mouth daily. 30 tablet 6  . nitroGLYCERIN (NITROSTAT) 0.4 MG SL tablet Place 1 tablet (0.4 mg total) under the tongue every 5 (five) minutes as needed for chest pain. 25 tablet 3   No current facility-administered medications for this visit.     Allergies:   Sinus & allergy [chlorpheniramine-phenylephrine]; Demerol; and Penicillins   Social History:  The patient  reports that she has never smoked. She has never used smokeless tobacco. She reports that she does not drink alcohol or use drugs.   Family History:  The patient's family history includes CAD in her father.    ROS:  Please see the history of present illness.   Otherwise, review of systems is positive for type change, muscle pains, visual changes.   All other systems are reviewed and negative.   PHYSICAL EXAM: VS:  BP 110/62   Pulse (!) 48   Ht 5' 3"  (1.6 m)   Wt 196 lb (88.9 kg)   SpO2 97%   BMI 34.72 kg/m  , BMI Body mass index is  34.72 kg/m. GEN: Well nourished, well developed, in no acute distress  HEENT: normal  Neck: no JVD, carotid bruits, or masses Cardiac: RRR; no murmurs, rubs, or gallops,no edema  Respiratory:  clear to auscultation bilaterally, normal work of breathing GI: soft, nontender, nondistended, + BS MS: no deformity or atrophy  Skin: warm and dry Neuro:  Strength and sensation are intact Psych: euthymic mood, full affect  EKG:  EKG is ordered today. Personal review of the ekg ordered shows sinus rhythm, rate 48, PVCs, PACs, QTC 418 ms  Recent Labs: 07/23/2017: ALT 12; B Natriuretic Peptide 1,008.0; Hemoglobin 11.7; Platelets 284; TSH 3.932 08/15/2017: BUN 14; Creatinine, Ser 0.83; Magnesium 2.1; Potassium 4.3; Sodium 139    Lipid Panel  No results found for: CHOL, TRIG, HDL, CHOLHDL, VLDL, LDLCALC, LDLDIRECT   Wt Readings from Last 3 Encounters:  09/10/17 196 lb (88.9 kg)  08/15/17 193 lb 9.6 oz (87.8 kg)  08/12/17 194  lb (88 kg)      Other studies Reviewed: Additional studies/ records that were reviewed today include: TTE 04/25/17  Review of the above records today demonstrates:  - Left ventricle: The cavity size was mildly to moderately dilated.   Wall thickness was normal. The estimated ejection fraction was   approximately 25%. Diffuse hypokinesis. There is akinesis of the   anteroseptal myocardium. There is akinesis of the   basal-midinferior myocardium. Indeterminate diastolic function. - Ventricular septum: Septal motion showed abnormal function and   dyssynergy. - Aortic valve: Mildly to moderately calcified annulus. Trileaflet. - Mitral valve: Mildly calcified annulus. Mildly thickened leaflets   . There was moderate regurgitation. - Left atrium: The atrium was severely dilated. - Right ventricle: Systolic function was mildly reduced. - Right atrium: The atrium was at the upper limits of normal in   size. Central venous pressure (est): 3 mm Hg. - Atrial septum: No defect or patent foramen ovale was identified. - Tricuspid valve: There was mild regurgitation. - Pulmonary arteries: PA peak pressure: 30 mm Hg (S). - Pericardium, extracardiac: There was no pericardial effusion.  LHC 05/03/17  Hemodynamic findings consistent with mild secondary pulmonary hypertension.  Patient has severe nonischemic cardiomyopathy  Moderate to Severely reduced CO/CI  LV end diastolic pressure is moderately elevated.  Mid RCA stent widely patent  There is mild (2+) mitral regurgitation.   ASSESSMENT AND PLAN:  1.  Persistent atrial fibrillation: Xarelto.  She was previously on amiodarone but this was stopped.  She has been loaded on dofetilide.  She is in normal rhythm today.  She has noted that her hair started falling out with starting metoprolol.  We Arnold Depinto switch her to carvedilol today.    This patients CHA2DS2-VASc Score and unadjusted Ischemic Stroke Rate (% per year) is equal to 4.8 % stroke  rate/year from a score of 4  Above score calculated as 1 point each if present [CHF, HTN, DM, Vascular=MI/PAD/Aortic Plaque, Age if 65-74, or Female] Above score calculated as 2 points each if present [Age > 75, or Stroke/TIA/TE]     2.  Nonischemic cardiomyopathy: Early on Toprol-XL and lisinopril.  No changes.  3.  Coronary artery disease: Status post RCA stent in 2014.  No current chest pain.  Current medicines are reviewed at length with the patient today.   The patient does not have concerns regarding her medicines.  The following changes were made today: Stop metoprolol, start carvedilol  Labs/ tests ordered today include:  Orders Placed This Encounter  Procedures  . EKG  12-Lead    Disposition:   FU with Lavonne Cass 6 months  Signed, Chantry Headen Meredith Leeds, MD  09/10/2017 12:37 PM     [facility] [address] [place] [address] [phone] (office) [phone] (fax)

## 2017-09-10 ENCOUNTER — Encounter: Payer: Self-pay | Admitting: Cardiology

## 2017-09-10 ENCOUNTER — Ambulatory Visit: Payer: Medicare Other | Admitting: Cardiology

## 2017-09-10 VITALS — BP 110/62 | HR 48 | Ht 63.0 in | Wt 196.0 lb

## 2017-09-10 DIAGNOSIS — I428 Other cardiomyopathies: Secondary | ICD-10-CM | POA: Diagnosis not present

## 2017-09-10 DIAGNOSIS — I4819 Other persistent atrial fibrillation: Secondary | ICD-10-CM

## 2017-09-10 DIAGNOSIS — I481 Persistent atrial fibrillation: Secondary | ICD-10-CM | POA: Diagnosis not present

## 2017-09-10 DIAGNOSIS — I2510 Atherosclerotic heart disease of native coronary artery without angina pectoris: Secondary | ICD-10-CM | POA: Diagnosis not present

## 2017-09-10 MED ORDER — CARVEDILOL 6.25 MG PO TABS: 6.2500 mg | ORAL_TABLET | Freq: Two times a day (BID) | ORAL | 6 refills | Status: DC

## 2017-09-10 NOTE — Patient Instructions (Addendum)
Medication Instructions:  Your physician has recommended you make the following change in your medication:  1. STOP Metoprolol Succinate (Toprol) 2. START Carvedilol 6.25 mg twice daily  * If you need a refill on your cardiac medications before your next appointment, please call your pharmacy.   Labwork: None ordered  Testing/Procedures: None ordered  Follow-Up: Your physician wants you to follow-up in: 6 months  with Dr. Curt Bears.  You will receive a reminder letter in the mail two months in advance. If you don't receive a letter, please call our office to schedule the follow-up appointment.   Thank you for choosing [facility]!!   Trinidad Curet, RN [phone]  Any Other Special Instructions Will Be Listed Below (If Applicable).

## 2017-09-10 NOTE — Addendum Note (Signed)
Addended by: Stanton Kidney on: 09/10/2017 01:06 PM   Modules accepted: Orders

## 2017-09-22 ENCOUNTER — Telehealth: Payer: Self-pay | Admitting: Physician Assistant

## 2017-09-22 NOTE — Telephone Encounter (Signed)
Paged by answering service. She woke up this morning 4:40am with "puffy and itchy face". Feels  Fatigue. No chest pain, fever, cough, congestion, dyspnea, palpitations, orthopnea, PND, dizziness or syncope. No tick bite or any allergy. HR of 58 on on her watch. She took her Coreg and Tikosyn this morning.   Recently changed tropol to coreg due hair falling. No change.   During my evaluation HR of 58 BP of 147/106 on BP cuff. Advised to hold coreg. She was told not to take Benadryl due to interaction with Tikosyn. Trial of OTC Zyrtec (she will review with pharmacist when goes to pick up OTC Zyrtec). IF BP goes above 160/110 she will take extra Lisinopril. Go to ER if worsening symptoms or cann't breath. Otherwise she will call office in morning after 9am.   She is agree with plan and appreciative of call.

## 2017-09-23 ENCOUNTER — Telehealth: Payer: Self-pay | Admitting: Cardiology

## 2017-09-23 NOTE — Telephone Encounter (Signed)
Pt aware it may be Wednesday before she hears from office. She is agreeable and will continue to hold Carvedilol. She appreciates the update.

## 2017-09-23 NOTE — Telephone Encounter (Signed)
Pt called to report that she has been having facial rash and edema since starting her Coreg.Marland Kitchen He face has been itching and burning.She called to MD on call in Sunday and he advised her to stop the Coreg and to take her Zyrtec. She has only noticed a minor decrease in the itching. Her last Coreg dose was yesterday morning. I have advised her to continue to hold her Coreg and will talk with Dr. Curt Bears and let her know his recommendations going forward. Pt agreed.

## 2017-09-23 NOTE — Telephone Encounter (Signed)
Pt c/o medication issue:  1. Name of Medication:  Not sure which medicine is causing these issues- Might be her Coreg  2. How are you currently taking this medication (dosage and times per day)?  2 times a day  3. Are you having a reaction (difficulty breathing--STAT)? no  4. What is your medication issue? Face  Swollen a little, red and it itches- yesterday the itching was worse

## 2017-09-26 ENCOUNTER — Other Ambulatory Visit: Payer: Self-pay | Admitting: Cardiology

## 2017-09-26 NOTE — Telephone Encounter (Signed)
Informed pt of plan to hold rate controlling medication for now.  Pt will monitor BP & HR and call if  begins to elevate and/or palpitations/other SE arise.  VS this morning: 130/69, 66  Pt is agreeable to plan.

## 2017-09-26 NOTE — Telephone Encounter (Signed)
Lets hold on rate control for now.

## 2017-09-26 NOTE — Telephone Encounter (Signed)
Dr. Curt Bears - you recommended starting Bisoprolol 60m  in place of Carvedilol.  When reviewing OV her BP was 110/62 & HR was 48.  Do you want to proceed with starting Bisoprolol?  (Pt started on Tikosyn 07/2017) ([place] Toprol stopped d/t hair loss and Carvedilol started.  9/15 Carvedilol stopped due to facial swelling/itching)

## 2017-10-18 DIAGNOSIS — Z23 Encounter for immunization: Secondary | ICD-10-CM | POA: Diagnosis not present

## 2017-11-20 DIAGNOSIS — I429 Cardiomyopathy, unspecified: Secondary | ICD-10-CM | POA: Diagnosis not present

## 2017-11-20 DIAGNOSIS — R5383 Other fatigue: Secondary | ICD-10-CM | POA: Diagnosis not present

## 2017-11-20 DIAGNOSIS — Z6834 Body mass index (BMI) 34.0-34.9, adult: Secondary | ICD-10-CM | POA: Diagnosis not present

## 2017-11-25 ENCOUNTER — Other Ambulatory Visit: Payer: Self-pay | Admitting: Physician Assistant

## 2017-11-25 ENCOUNTER — Other Ambulatory Visit: Payer: Self-pay | Admitting: Cardiology

## 2017-11-25 NOTE — Telephone Encounter (Signed)
Dr. Domenic Polite is the primary cardiologist. Please address

## 2017-11-30 ENCOUNTER — Other Ambulatory Visit: Payer: Self-pay | Admitting: Cardiology

## 2017-12-03 DIAGNOSIS — J321 Chronic frontal sinusitis: Secondary | ICD-10-CM | POA: Diagnosis not present

## 2017-12-03 DIAGNOSIS — Z6835 Body mass index (BMI) 35.0-35.9, adult: Secondary | ICD-10-CM | POA: Diagnosis not present

## 2017-12-19 ENCOUNTER — Other Ambulatory Visit: Payer: Self-pay | Admitting: *Deleted

## 2017-12-19 MED ORDER — LISINOPRIL 5 MG PO TABS: 5.0000 mg | ORAL_TABLET | Freq: Every day | ORAL | 2 refills | Status: DC

## 2017-12-20 ENCOUNTER — Other Ambulatory Visit: Payer: Self-pay | Admitting: Cardiology

## 2017-12-20 ENCOUNTER — Other Ambulatory Visit (INDEPENDENT_AMBULATORY_CARE_PROVIDER_SITE_OTHER): Payer: Self-pay | Admitting: Internal Medicine

## 2017-12-20 NOTE — Telephone Encounter (Signed)
Pt last saw Dr Curt Bears 09/10/17, last labs 08/15/17 Creat 0.83, age 84, weight 88.9kg, CrCl 69.55, based on CrCl pt is on appropriate dosage of Xarelto 59m QD.  Will refill rx.

## 2017-12-26 DIAGNOSIS — D3131 Benign neoplasm of right choroid: Secondary | ICD-10-CM | POA: Diagnosis not present

## 2018-01-25 ENCOUNTER — Other Ambulatory Visit: Payer: Self-pay | Admitting: Physician Assistant

## 2018-01-29 ENCOUNTER — Ambulatory Visit: Payer: Medicare Other | Admitting: Urology

## 2018-01-29 DIAGNOSIS — C674 Malignant neoplasm of posterior wall of bladder: Secondary | ICD-10-CM

## 2018-02-17 ENCOUNTER — Other Ambulatory Visit: Payer: Self-pay | Admitting: Cardiology

## 2018-02-19 ENCOUNTER — Other Ambulatory Visit (HOSPITAL_COMMUNITY): Payer: Self-pay | Admitting: Physician Assistant

## 2018-03-18 ENCOUNTER — Other Ambulatory Visit: Payer: Self-pay | Admitting: Cardiology

## 2018-03-28 ENCOUNTER — Ambulatory Visit: Payer: Medicare Other | Admitting: Cardiology

## 2018-04-08 ENCOUNTER — Telehealth: Payer: Self-pay | Admitting: Cardiology

## 2018-04-08 NOTE — Telephone Encounter (Signed)
° ° °  Pt c/o swelling: STAT is pt has developed SOB within 24 hours  1) How much weight have you gained and in what time span? n/a  2) If swelling, where is the swelling located? Legs, feet  3) Are you currently taking a fluid pill? yes  4) Are you currently SOB? No, "a little" after moving around  5) Do you have a log of your daily weights (if so, list)? 211 today  6) Have you gained 3 pounds in a day or 5 pounds in a week? no  7) Have you traveled recently? no

## 2018-04-08 NOTE — Telephone Encounter (Signed)
Pt denies any recent medication changes or dietary changes.  Denies increased salt intake. Denies weight gain, that she knows of, dizziness, SOB. States that she has some swelling around her feet/ankles.  Her left foot is greater than the right.  States that last week she took 2 extra Lasix and an extra Potassium and that helped. Reports that she hasn't seen Domenic Polite since early 2019. She understands we may have her take some extra Lasix again and she is agreeable to this. Pt aware I will have Dr. Curt Bears review and I would call her tomorrow with advisement. She is agreeable to plan.

## 2018-04-10 NOTE — Telephone Encounter (Signed)
Advised pt that she can take extra Lasix and extra K+, for swelling, per Dr. Curt Bears. Advised that she may take for 2/3 days but no more.  Advised that if it wasn't helping after 3 days to call the office. Advised that if she needs to take extra Lasix frequently to call the office to discuss further. Patient verbalized understanding and agreeable to plan.

## 2018-04-15 ENCOUNTER — Telehealth: Payer: Self-pay | Admitting: Cardiology

## 2018-04-15 ENCOUNTER — Other Ambulatory Visit: Payer: Self-pay

## 2018-04-15 MED ORDER — DOFETILIDE 250 MCG PO CAPS: 250.0000 ug | ORAL_CAPSULE | Freq: Two times a day (BID) | ORAL | 11 refills | Status: DC

## 2018-04-15 MED ORDER — ATORVASTATIN CALCIUM 20 MG PO TABS: 20.0000 mg | ORAL_TABLET | Freq: Every day | ORAL | 11 refills | Status: DC

## 2018-04-15 MED ORDER — ATORVASTATIN CALCIUM 20 MG PO TABS: 20.0000 mg | ORAL_TABLET | Freq: Every day | ORAL | 0 refills | Status: DC

## 2018-04-15 NOTE — Telephone Encounter (Signed)
°*  STAT* If patient is at the pharmacy, call can be transferred to refill team.   1. Which medications need to be refilled?  dofetilide (TIKOSYN) 250 MCG capsule  atorvastatin (LIPITOR) 20 MG tablet      2. Which pharmacy/location (including street and city if local pharmacy) is medication to be sent to? [place]  3. Do they need a 30 day or 90 day supply? [place]

## 2018-04-17 ENCOUNTER — Other Ambulatory Visit: Payer: Self-pay

## 2018-04-17 MED ORDER — ATORVASTATIN CALCIUM 20 MG PO TABS: 20.0000 mg | ORAL_TABLET | Freq: Every day | ORAL | 3 refills | Status: DC

## 2018-04-17 NOTE — Telephone Encounter (Signed)
Pharmacy had only received 30 day supply with 11 RF of  atorvastatin, pt's insurance requests 90 day supply, e-scribed 90 day to pharmacy

## 2018-04-21 ENCOUNTER — Other Ambulatory Visit: Payer: Self-pay

## 2018-04-21 MED ORDER — LISINOPRIL 5 MG PO TABS: 5.0000 mg | ORAL_TABLET | Freq: Every day | ORAL | 3 refills | Status: DC

## 2018-04-21 NOTE — Telephone Encounter (Signed)
90 day lisinopril filled

## 2018-05-19 ENCOUNTER — Other Ambulatory Visit (INDEPENDENT_AMBULATORY_CARE_PROVIDER_SITE_OTHER): Payer: Self-pay | Admitting: Internal Medicine

## 2018-05-23 ENCOUNTER — Other Ambulatory Visit: Payer: Self-pay

## 2018-05-23 MED ORDER — ATORVASTATIN CALCIUM 20 MG PO TABS: 20.0000 mg | ORAL_TABLET | Freq: Every day | ORAL | 3 refills | Status: DC

## 2018-05-23 NOTE — Telephone Encounter (Signed)
90 day refill atorvastatin per fax request

## 2018-05-27 DIAGNOSIS — Z6838 Body mass index (BMI) 38.0-38.9, adult: Secondary | ICD-10-CM | POA: Diagnosis not present

## 2018-05-27 DIAGNOSIS — E559 Vitamin D deficiency, unspecified: Secondary | ICD-10-CM | POA: Diagnosis not present

## 2018-05-27 DIAGNOSIS — I428 Other cardiomyopathies: Secondary | ICD-10-CM | POA: Diagnosis not present

## 2018-05-27 DIAGNOSIS — R600 Localized edema: Secondary | ICD-10-CM | POA: Diagnosis not present

## 2018-05-27 DIAGNOSIS — M65331 Trigger finger, right middle finger: Secondary | ICD-10-CM | POA: Diagnosis not present

## 2018-06-11 ENCOUNTER — Ambulatory Visit (INDEPENDENT_AMBULATORY_CARE_PROVIDER_SITE_OTHER): Payer: Medicare Other | Admitting: Internal Medicine

## 2018-06-11 ENCOUNTER — Other Ambulatory Visit: Payer: Self-pay

## 2018-06-11 ENCOUNTER — Encounter (INDEPENDENT_AMBULATORY_CARE_PROVIDER_SITE_OTHER): Payer: Self-pay | Admitting: Internal Medicine

## 2018-06-11 VITALS — BP 113/63 | HR 36 | Temp 98.2°F | Ht 63.0 in | Wt 211.3 lb

## 2018-06-11 DIAGNOSIS — K5120 Ulcerative (chronic) proctitis without complications: Secondary | ICD-10-CM | POA: Diagnosis not present

## 2018-06-11 LAB — CBC WITH DIFFERENTIAL/PLATELET
Absolute Monocytes: 684 cells/uL (ref 200–950)
Basophils Absolute: 50 cells/uL (ref 0–200)
Basophils Relative: 0.7 %
Eosinophils Absolute: 137 cells/uL (ref 15–500)
Eosinophils Relative: 1.9 %
HCT: 42.4 % (ref 35.0–45.0)
Hemoglobin: 14.2 g/dL (ref 11.7–15.5)
Lymphs Abs: 2189 cells/uL (ref 850–3900)
MCH: 30.5 pg (ref 27.0–33.0)
MCHC: 33.5 g/dL (ref 32.0–36.0)
MCV: 91 fL (ref 80.0–100.0)
MPV: 11.5 fL (ref 7.5–12.5)
Monocytes Relative: 9.5 %
Neutro Abs: 4140 cells/uL (ref 1500–7800)
Neutrophils Relative %: 57.5 %
Platelets: 309 10*3/uL (ref 140–400)
RBC: 4.66 10*6/uL (ref 3.80–5.10)
RDW: 14 % (ref 11.0–15.0)
Total Lymphocyte: 30.4 %
WBC: 7.2 10*3/uL (ref 3.8–10.8)

## 2018-06-11 LAB — SEDIMENTATION RATE: Sed Rate: 25 mm/h (ref 0–30)

## 2018-06-11 NOTE — Patient Instructions (Signed)
CBC and sedrate.  OV in 1 year.

## 2018-06-11 NOTE — Progress Notes (Signed)
Subjective:    Patient ID: [name], female    DOB: 10-Jun-1930, 85 y.o.   MRN: [id number]  HPIHere today for f/u. Hx of UC and maintained on Balsalazide. Diagnosed in 2007.  Her last colonoscopy was in 2017. She tells me she is doing well. She has no GI complaints. Her appetite is good. Having one stool a day. No melena or BRRB  He denies any abdominal pain.   Hx of atrial fib and CHF and maintained on Xarelto   06/15/2015 Colonoscopy: UC. Dr. Laural Golden: Impression: - Two 4 to 7 mm polyps in the cecum, removed with a  cold snare. Resected and retrieved. Clip (MR  conditional) was placed. - Three small polyps in the sigmoid colon and at  the splenic flexure. Biopsied. - The entire examined colon is normal on direct and  retroflexion views.  Patient had 5 small polyps removed and they're tubular adenomas. Hx of bladder tumor. Transurethral resection of bladder tumors in 2018. Review of Systems Past Medical History:  Diagnosis Date  . Acute blood loss anemia 06/2017  . Arthritis   . Atrial flutter ([place])   . Bladder cancer ([place])   . Coronary atherosclerosis of native coronary artery    a. BMS RCA May 2014 - [place] stent. b. Cath 04/2017 - patent stent, minimal CAD otherwise.  . Depression   . Essential hypertension, benign   . GI bleeding 06/2017   a. melena/small bowel ulcer by capsule endo 06/2017.  Marland Kitchen Hyperlipemia   . Macular degeneration   . NICM (nonischemic cardiomyopathy) ([place])    a. EF fluctuating over the years in the 30s,40s, but most recently 25% in 04/2017.  Marland Kitchen Persistent atrial fibrillation   . Ulcerative colitis     Past Surgical History:  Procedure Laterality Date  . ABDOMINAL HYSTERECTOMY    . APPENDECTOMY    . Bilateral knee replacements      2007, 2008  . BLADDER SURGERY    .  CARDIOVERSION N/A 02/04/2017   Procedure: CARDIOVERSION;  Surgeon: Arnoldo Lenis, MD;  [facility];  Service: Endoscopy;  Laterality: N/A;  . CARDIOVERSION N/A 02/26/2017   Procedure: CARDIOVERSION;  Surgeon: Satira Sark, MD;  [facility];  Service: Cardiovascular;  Laterality: N/A;  . CARDIOVERSION N/A 08/07/2017   Procedure: CARDIOVERSION;  Surgeon: Pixie Casino, MD;  Location: [facility];  Service: Cardiovascular;  Laterality: N/A;  . COLONOSCOPY N/A 06/15/2015   Procedure: COLONOSCOPY;  Surgeon: Rogene Houston, MD;  [facility];  Service: Endoscopy;  Laterality: N/A;  210  . CYSTOSCOPY W/ RETROGRADES Bilateral 05/14/2016   Procedure: CYSTOSCOPY WITH BILATERAL RETROGRADE PYELOGRAM;  Surgeon: Raynelle Bring, MD;  [facility];  Service: Urology;  Laterality: Bilateral;  GENERAL ANESTHESIA WITH PARALYSIS  . ESOPHAGOGASTRODUODENOSCOPY (EGD) WITH PROPOFOL N/A 06/14/2017   Procedure: ESOPHAGOGASTRODUODENOSCOPY (EGD) WITH PROPOFOL;  Surgeon: Rogene Houston, MD;  [facility];  Service: Endoscopy;  Laterality: N/A;  . GIVENS CAPSULE STUDY  06/14/2017   Procedure: GIVENS CAPSULE STUDY;  Surgeon: Rogene Houston, MD;  [facility];  Service: Endoscopy;;  . LEFT HEART CATHETERIZATION WITH CORONARY ANGIOGRAM N/A 10/30/2013   Procedure: LEFT HEART CATHETERIZATION WITH CORONARY ANGIOGRAM;  Surgeon: Burnell Blanks, MD;  Location: [facility] CATH LAB;  Service: Cardiovascular;  Laterality: N/A;  . RIGHT/LEFT HEART CATH AND CORONARY ANGIOGRAPHY N/A 05/03/2017   Procedure: RIGHT/LEFT HEART CATH AND CORONARY ANGIOGRAPHY;  Surgeon: Leonie Man, MD;  Location:  [place] INVASIVE CV LAB;  Service: Cardiovascular;  Laterality: N/A;  . TEE WITHOUT CARDIOVERSION N/A 02/04/2017   Procedure: TRANSESOPHAGEAL ECHOCARDIOGRAM (TEE) WITH PROPOL;  Surgeon: Arnoldo Lenis, MD;  [facility];  Service: Endoscopy;  Laterality: N/A;  . TONSILLECTOMY     . TOTAL KNEE ARTHROPLASTY    . TRANSURETHRAL RESECTION OF BLADDER TUMOR N/A 05/14/2016   Procedure: TRANSURETHRAL RESECTION OF BLADDER TUMOR (TURBT);  Surgeon: Raynelle Bring, MD;  [facility];  Service: Urology;  Laterality: N/A;  GENERAL ANESTHESIA WITH PARALYSIS  . YAG LASER APPLICATION Bilateral 82/07/784   Procedure: YAG LASER APPLICATION;  Surgeon: Williams Che, MD;  [facility];  Service: Ophthalmology;  Laterality: Bilateral;    Allergies  Allergen Reactions  . Sinus & Allergy [Chlorpheniramine-Phenylephrine] Shortness Of Breath  . Carvedilol Itching and Rash    Facial rash/itching  . Demerol Rash  . Penicillins Rash and Other (See Comments)    REACTION: rash, years ago Has patient had a PCN reaction causing immediate rash, facial/tongue/throat swelling, SOB or lightheadedness with hypotension: Yes Has patient had a PCN reaction causing severe rash involving mucus membranes or skin necrosis: No Has patient had a PCN reaction that required hospitalization No Has patient had a PCN reaction occurring within the last 10 years: No If all of the above answers are "NO", then may proceed with Cephalosporin use.     Current Outpatient Medications on File Prior to Visit  Medication Sig Dispense Refill  . acetaminophen (TYLENOL) 500 MG tablet Take 1,000 mg by mouth every 6 (six) hours as needed for mild pain.     Marland Kitchen atorvastatin (LIPITOR) 20 MG tablet Take 1 tablet (20 mg total) by mouth at bedtime. 90 tablet 3  . balsalazide (COLAZAL) 750 MG capsule TAKE THREE CAPSULES BY MOUTH THREE TIMES DAILY 270 capsule 3  . Cholecalciferol (VITAMIN D-3) 125 MCG (5000 UT) TABS Take 1,000 Units by mouth.    . Cranberry 1000 MG CAPS Take 4,200 mg by mouth daily.    Marland Kitchen dofetilide (TIKOSYN) 250 MCG capsule Take 1 capsule (250 mcg total) by mouth 2 (two) times daily. 60 capsule 11  . furosemide (LASIX) 20 MG tablet TAKE TWO TABLETS BY MOUTH TWICE DAILY 360 tablet 2  . Multiple  Vitamins-Minerals (ICAPS AREDS 2 PO) Take 2 tablets by mouth daily at 12 noon.     . potassium chloride SA (K-DUR,KLOR-CON) 20 MEQ tablet TAKE 1 TABLET BY MOUTH THREE TIMES DAILY 90 tablet 6  . XARELTO 20 MG TABS tablet TAKE 1 TABLET BY MOUTH DAILY WITH SUPPER 30 tablet 6   No current facility-administered medications on file prior to visit.         Objective:   Physical Exam Blood pressure 113/63, pulse (!) 36, temperature 98.2 F (36.8 C), height 5' 3"  (1.6 m), weight 211 lb 4.8 oz (95.8 kg). Alert and oriented. Skin warm and dry. Oral mucosa is moist.   . Sclera anicteric, conjunctivae is pink. Thyroid not enlarged. No cervical lymphadenopathy. Lungs clear. Heart regular rate and rhythm.  Abdomen is soft. Bowel sounds are positive. No hepatomegaly. No abdominal masses felt. No tenderness.  No edema to lower extremities.         Assessment & Plan:  UC. She seems to be in remission. Will get a CBC and sedrate. OV in 1 year.

## 2018-07-20 ENCOUNTER — Other Ambulatory Visit: Payer: Self-pay | Admitting: Cardiology

## 2018-07-21 NOTE — Telephone Encounter (Signed)
Age 85, weight 95.8kg, SCr 0.83 on 08/15/17, CrCl 70m/min. Last OV 09/2017, afib indication

## 2018-07-24 ENCOUNTER — Other Ambulatory Visit: Payer: Self-pay | Admitting: *Deleted

## 2018-07-24 MED ORDER — ATORVASTATIN CALCIUM 20 MG PO TABS: 20.0000 mg | ORAL_TABLET | Freq: Every day | ORAL | 3 refills | Status: DC

## 2018-08-22 ENCOUNTER — Telehealth: Payer: Self-pay | Admitting: *Deleted

## 2018-08-22 MED ORDER — LISINOPRIL 5 MG PO TABS: 5.0000 mg | ORAL_TABLET | Freq: Every day | ORAL | 0 refills | Status: DC

## 2018-08-22 NOTE — Telephone Encounter (Signed)
Refill request received from [facility] for lisinopril 5 mg. Lisinopril was removed from med list by GI provider in June 2020. Spoke with patient and she confirmed that she does take lisinopril 5 mg. Refill sent to pharmacy.

## 2018-08-24 ENCOUNTER — Encounter

## 2018-08-26 ENCOUNTER — Other Ambulatory Visit: Payer: Self-pay

## 2018-08-26 ENCOUNTER — Encounter: Payer: Self-pay | Admitting: Cardiology

## 2018-08-26 ENCOUNTER — Telehealth: Payer: Self-pay | Admitting: Radiology

## 2018-08-26 ENCOUNTER — Ambulatory Visit (INDEPENDENT_AMBULATORY_CARE_PROVIDER_SITE_OTHER): Payer: Medicare Other | Admitting: Cardiology

## 2018-08-26 VITALS — BP 142/72 | HR 88 | Ht 63.0 in | Wt 218.6 lb

## 2018-08-26 DIAGNOSIS — Z79899 Other long term (current) drug therapy: Secondary | ICD-10-CM

## 2018-08-26 DIAGNOSIS — I4819 Other persistent atrial fibrillation: Secondary | ICD-10-CM

## 2018-08-26 NOTE — Telephone Encounter (Signed)
Enrolled patient for a 3 day Zio monitor to be mailed. Brief instructions were left on voicemail (per DPR) and patient knows to expect the monitor to arrive in 3-4 days.

## 2018-08-26 NOTE — Addendum Note (Signed)
Addended by: Stanton Kidney on: 08/26/2018 12:36 PM   Modules accepted: Orders

## 2018-08-26 NOTE — Addendum Note (Signed)
Addended by: Marciano Sequin on: 08/26/2018 12:38 PM   Modules accepted: Orders

## 2018-08-26 NOTE — Addendum Note (Signed)
Addended by: Stanton Kidney on: 08/26/2018 12:28 PM   Modules accepted: Orders

## 2018-08-26 NOTE — Patient Instructions (Addendum)
Medication Instructions:  Your physician recommends that you continue on your current medications as directed. Please refer to the Current Medication list given to you today.  * If you need a refill on your cardiac medications before your next appointment, please call your pharmacy.   Labwork: Today: Magnesium level & BMET *We will only notify you of abnormal results, otherwise continue current treatment plan.  Testing/Procedures: Your physician has recommended that you wear a 3 day holter monitor. Holter monitors are medical devices that record the heart's electrical activity. Doctors most often use these monitors to diagnose arrhythmias. Arrhythmias are problems with the speed or rhythm of the heartbeat. The monitor is a small, portable device. You can wear one while you do your normal daily activities. This is usually used to diagnose what is causing palpitations/syncope (passing out).  Follow-Up: Your physician recommends that you schedule a follow-up appointment in: 3 months with Dr. Curt Bears.  Thank you for choosing [facility]!!   Trinidad Curet, RN [phone]

## 2018-08-26 NOTE — Progress Notes (Signed)
Electrophysiology Office Note   Date:  08/26/2018   ID:  [name], [name] [birth year], MRN [id number]  PCP:  [name] Percy, MD  Cardiologist:  [name] Primary Electrophysiologist:  [name] Kunz Meredith Leeds, MD    No chief complaint on file.    History of Present Illness: [name] is a 85 y.o. female who is being seen today for the evaluation of atrial fibrillation at the request of [name] Percy, MD. Presenting today for electrophysiology evaluation.  She has a history of atrial flutter, coronary artery disease status post RCA stent in 2014, paroxysmal atrial fibrillation, and nonischemic cardiomyopathy.  She does have atrial fibrillation and is failed cardioversion and amiodarone.  Most recent echo showed an EF of 25% with catheterization showing a likely nonischemic cardiomyopathy.  She has weakness in class III dyspnea.  Today, denies symptoms of palpitations, chest pain, shortness of breath, orthopnea, PND, lower extremity edema, claudication, dizziness, presyncope, syncope, bleeding, or neurologic sequela. The patient is tolerating medications without difficulties.  Unfortunately, she has been fatigued for the past few months.  Her ECG shows a new left bundle branch block and atrial bigeminy.  She is able to cook and clean around the house, but that is about it.  She does not go out very much due to her fatigue.  Past Medical History:  Diagnosis Date  . Acute blood loss anemia 06/2017  . Arthritis   . Atrial flutter ([place])   . Bladder cancer ([place])   . Coronary atherosclerosis of native coronary artery    a. BMS RCA May 2014 - [place] stent. b. Cath 04/2017 - patent stent, minimal CAD otherwise.  . Depression   . Essential hypertension, benign   . GI bleeding 06/2017   a. melena/small bowel ulcer by capsule endo 06/2017.  Marland Kitchen Hyperlipemia   . Macular degeneration   . NICM (nonischemic cardiomyopathy) ([place])    a. EF fluctuating over the years in the 30s,40s, but most  recently 25% in 04/2017.  Marland Kitchen Persistent atrial fibrillation   . Ulcerative colitis    Past Surgical History:  Procedure Laterality Date  . ABDOMINAL HYSTERECTOMY    . APPENDECTOMY    . Bilateral knee replacements      2007, 2008  . BLADDER SURGERY    . CARDIOVERSION N/A 02/04/2017   Procedure: CARDIOVERSION;  Surgeon: Arnoldo Lenis, MD;  [facility];  Service: Endoscopy;  Laterality: N/A;  . CARDIOVERSION N/A 02/26/2017   Procedure: CARDIOVERSION;  Surgeon: Satira Sark, MD;  Location: [facility];  Service: Cardiovascular;  Laterality: N/A;  . CARDIOVERSION N/A 08/07/2017   Procedure: CARDIOVERSION;  Surgeon: Pixie Casino, MD;  Location: [facility];  Service: Cardiovascular;  Laterality: N/A;  . COLONOSCOPY N/A 06/15/2015   Procedure: COLONOSCOPY;  Surgeon: Rogene Houston, MD;  [facility];  Service: Endoscopy;  Laterality: N/A;  210  . CYSTOSCOPY W/ RETROGRADES Bilateral 05/14/2016   Procedure: CYSTOSCOPY WITH BILATERAL RETROGRADE PYELOGRAM;  Surgeon: Raynelle Bring, MD;  Location: [facility];  Service: Urology;  Laterality: Bilateral;  GENERAL ANESTHESIA WITH PARALYSIS  . ESOPHAGOGASTRODUODENOSCOPY (EGD) WITH PROPOFOL N/A 06/14/2017   Procedure: ESOPHAGOGASTRODUODENOSCOPY (EGD) WITH PROPOFOL;  Surgeon: Rogene Houston, MD;  [facility];  Service: Endoscopy;  Laterality: N/A;  . GIVENS CAPSULE STUDY  06/14/2017   Procedure: GIVENS CAPSULE STUDY;  Surgeon: Rogene Houston, MD;  [facility];  Service: Endoscopy;;  . LEFT HEART CATHETERIZATION WITH CORONARY ANGIOGRAM N/A 10/30/2013  Procedure: LEFT HEART CATHETERIZATION WITH CORONARY ANGIOGRAM;  Surgeon: Burnell Blanks, MD;  Location[facility];  Service: Cardiovascular;  Laterality: N/A;  . RIGHT/LEFT HEART CATH AND CORONARY ANGIOGRAPHY N/A 05/03/2017   Procedure: RIGHT/LEFT HEART CATH AND CORONARY ANGIOGRAPHY;  Surgeon: Leonie Man, MD;  Location: [facility] CV LAB;  Service:  Cardiovascular;  Laterality: N/A;  . TEE WITHOUT CARDIOVERSION N/A 02/04/2017   Procedure: TRANSESOPHAGEAL ECHOCARDIOGRAM (TEE) WITH PROPOL;  Surgeon: Arnoldo Lenis, MD;  [facility];  Service: Endoscopy;  Laterality: N/A;  . TONSILLECTOMY    . TOTAL KNEE ARTHROPLASTY    . TRANSURETHRAL RESECTION OF BLADDER TUMOR N/A 05/14/2016   Procedure: TRANSURETHRAL RESECTION OF BLADDER TUMOR (TURBT);  Surgeon: Raynelle Bring, MD;  Location[facility];  Service: Urology;  Laterality: N/A;  GENERAL ANESTHESIA WITH PARALYSIS  . YAG LASER APPLICATION Bilateral 90/03/8331   Procedure: YAG LASER APPLICATION;  Surgeon: Williams Che, MD;  Location: [facility];  Service: Ophthalmology;  Laterality: Bilateral;     Current Outpatient Medications  Medication Sig Dispense Refill  . acetaminophen (TYLENOL) 500 MG tablet Take 1,000 mg by mouth every 6 (six) hours as needed for mild pain.     Marland Kitchen atorvastatin (LIPITOR) 20 MG tablet Take 1 tablet (20 mg total) by mouth at bedtime. 90 tablet 3  . balsalazide (COLAZAL) 750 MG capsule TAKE THREE CAPSULES BY MOUTH THREE TIMES DAILY 270 capsule 3  . Cholecalciferol (VITAMIN D-3) 125 MCG (5000 UT) TABS Take 1,000 Units by mouth.    . Cranberry 1000 MG CAPS Take 4,200 mg by mouth daily.    Marland Kitchen dofetilide (TIKOSYN) 250 MCG capsule Take 1 capsule (250 mcg total) by mouth 2 (two) times daily. 60 capsule 11  . furosemide (LASIX) 20 MG tablet TAKE TWO TABLETS BY MOUTH TWICE DAILY 360 tablet 2  . lisinopril (ZESTRIL) 5 MG tablet Take 1 tablet (5 mg total) by mouth daily. 90 tablet 0  . Multiple Vitamins-Minerals (ICAPS AREDS 2 PO) Take 2 tablets by mouth daily at 12 noon.     . potassium chloride SA (K-DUR,KLOR-CON) 20 MEQ tablet TAKE 1 TABLET BY MOUTH THREE TIMES DAILY 90 tablet 6  . XARELTO 20 MG TABS tablet TAKE 1 TABLET BY MOUTH DAILY WITH SUPPER 30 tablet 6   No current facility-administered medications for this visit.     Allergies:   Sinus & allergy  [chlorpheniramine-phenylephrine], Carvedilol, Demerol, and Penicillins   Social History:  The patient  reports that she has never smoked. She has never used smokeless tobacco. She reports that she does not drink alcohol or use drugs.   Family History:  The patient's family history includes CAD in her father.    ROS:  Please see the history of present illness.   Otherwise, review of systems is positive for none.   All other systems are reviewed and negative.   PHYSICAL EXAM: VS:  BP (!) 142/72   Pulse 88   Ht 5' 3"  (1.6 m)   Wt 218 lb 9.6 oz (99.2 kg)   SpO2 97%   BMI 38.72 kg/m  , BMI Body mass index is 38.72 kg/m. GEN: Well nourished, well developed, in no acute distress  HEENT: normal  Neck: no JVD, carotid bruits, or masses Cardiac: RRR; no murmurs, rubs, or gallops,no edema  Respiratory:  clear to auscultation bilaterally, normal work of breathing GI: soft, nontender, nondistended, + BS MS: no deformity or atrophy  Skin: warm and dry Neuro:  Strength  and sensation are intact Psych: euthymic mood, full affect  EKG:  EKG is ordered today. Personal review of the ekg ordered shows sinus rhythm, atrial bigeminy, left bundle branch block, rate 88  Recent Labs: 06/11/2018: Hemoglobin 14.2; Platelets 309    Lipid Panel  No results found for: CHOL, TRIG, HDL, CHOLHDL, VLDL, LDLCALC, LDLDIRECT   Wt Readings from Last 3 Encounters:  08/26/18 218 lb 9.6 oz (99.2 kg)  06/11/18 211 lb 4.8 oz (95.8 kg)  09/10/17 196 lb (88.9 kg)      Other studies Reviewed: Additional studies/ records that were reviewed today include: TTE 04/25/17  Review of the above records today demonstrates:  - Left ventricle: The cavity size was mildly to moderately dilated.   Wall thickness was normal. The estimated ejection fraction was   approximately 25%. Diffuse hypokinesis. There is akinesis of the   anteroseptal myocardium. There is akinesis of the   basal-midinferior myocardium. Indeterminate  diastolic function. - Ventricular septum: Septal motion showed abnormal function and   dyssynergy. - Aortic valve: Mildly to moderately calcified annulus. Trileaflet. - Mitral valve: Mildly calcified annulus. Mildly thickened leaflets   . There was moderate regurgitation. - Left atrium: The atrium was severely dilated. - Right ventricle: Systolic function was mildly reduced. - Right atrium: The atrium was at the upper limits of normal in   size. Central venous pressure (est): 3 mm Hg. - Atrial septum: No defect or patent foramen ovale was identified. - Tricuspid valve: There was mild regurgitation. - Pulmonary arteries: PA peak pressure: 30 mm Hg (S). - Pericardium, extracardiac: There was no pericardial effusion.  LHC 05/03/17  Hemodynamic findings consistent with mild secondary pulmonary hypertension.  Patient has severe nonischemic cardiomyopathy  Moderate to Severely reduced CO/CI  LV end diastolic pressure is moderately elevated.  Mid RCA stent widely patent  There is mild (2+) mitral regurgitation.   ASSESSMENT AND PLAN:  1.  Persistent atrial fibrillation: He on Xarelto and dofetilide.  It appears that she is in an atrial bigeminy currently.  She also has a new left bundle branch block.  QTC remains stable and not prolonged.  Due to her arrhythmia, Aritza Brunet plan for 3-day ZIO monitoring.  This patients CHA2DS2-VASc Score and unadjusted Ischemic Stroke Rate (% per year) is equal to 4.8 % stroke rate/year from a score of 4  Above score calculated as 1 point each if present [CHF, HTN, DM, Vascular=MI/PAD/Aortic Plaque, Age if 65-74, or Female] Above score calculated as 2 points each if present [Age > 75, or Stroke/TIA/TE]   2.  Nonischemic cardiomyopathy: Elevated today.  Her blood pressure is usually much better controlled.  Zephaniah Lubrano not make any further changes.  3.  Coronary artery disease: Status post RCA stent in 2014.  No current chest pain.  Current medicines are  reviewed at length with the patient today.   The patient does not have concerns regarding her medicines.  The following changes were made today: none  Labs/ tests ordered today include:  Orders Placed This Encounter  Procedures  . EKG 12-Lead    Disposition:   FU with Sandar Krinke 3 months  Signed, Elysha Daw Meredith Leeds, MD  08/26/2018 12:14 PM     [address] [place] [address] [phone] (office) [phone] (fax)

## 2018-08-27 LAB — MAGNESIUM: Magnesium: 2.3 mg/dL (ref 1.6–2.3)

## 2018-08-27 LAB — BASIC METABOLIC PANEL
BUN/Creatinine Ratio: 27 (ref 12–28)
BUN: 21 mg/dL (ref 8–27)
CO2: 23 mmol/L (ref 20–29)
Calcium: 10.2 mg/dL (ref 8.7–10.3)
Chloride: 101 mmol/L (ref 96–106)
Creatinine, Ser: 0.79 mg/dL (ref 0.57–1.00)
GFR calc Af Amer: 78 mL/min/{1.73_m2} (ref >59)
GFR calc non Af Amer: 68 mL/min/{1.73_m2} (ref >59)
Glucose: 90 mg/dL (ref 65–99)
Potassium: 5.1 mmol/L (ref 3.5–5.2)
Sodium: 140 mmol/L (ref 134–144)

## 2018-08-30 ENCOUNTER — Other Ambulatory Visit (INDEPENDENT_AMBULATORY_CARE_PROVIDER_SITE_OTHER): Payer: Medicare Other

## 2018-08-30 DIAGNOSIS — I4819 Other persistent atrial fibrillation: Secondary | ICD-10-CM | POA: Diagnosis not present

## 2018-09-10 ENCOUNTER — Ambulatory Visit (INDEPENDENT_AMBULATORY_CARE_PROVIDER_SITE_OTHER): Payer: Medicare Other | Admitting: Urology

## 2018-09-10 DIAGNOSIS — C674 Malignant neoplasm of posterior wall of bladder: Secondary | ICD-10-CM | POA: Diagnosis not present

## 2018-09-17 ENCOUNTER — Other Ambulatory Visit: Payer: Self-pay | Admitting: Cardiology

## 2018-09-18 ENCOUNTER — Telehealth: Payer: Self-pay | Admitting: Cardiology

## 2018-09-18 MED ORDER — ATORVASTATIN CALCIUM 20 MG PO TABS: 20.0000 mg | ORAL_TABLET | Freq: Every day | ORAL | 3 refills | Status: DC

## 2018-09-18 NOTE — Telephone Encounter (Signed)
done

## 2018-09-18 NOTE — Telephone Encounter (Signed)
°*  STAT* If patient is at the pharmacy, call can be transferred to refill team.   1. Which medications need to be refilled? (please list name of each medication and dose if known) atorvastatin (LIPITOR) 20 MG tablet    2. Which pharmacy/location (including street and city if local pharmacy) [place] [facility] [place]   [place]. Do they need a 30 day or 90 day supply? [place]

## 2018-09-19 ENCOUNTER — Other Ambulatory Visit: Payer: Self-pay | Admitting: *Deleted

## 2018-09-19 MED ORDER — LISINOPRIL 5 MG PO TABS: 5.0000 mg | ORAL_TABLET | Freq: Every day | ORAL | 1 refills | Status: DC

## 2018-10-01 DIAGNOSIS — N8110 Cystocele, unspecified: Secondary | ICD-10-CM | POA: Diagnosis not present

## 2018-10-01 DIAGNOSIS — Z6837 Body mass index (BMI) 37.0-37.9, adult: Secondary | ICD-10-CM | POA: Diagnosis not present

## 2018-10-01 DIAGNOSIS — N390 Urinary tract infection, site not specified: Secondary | ICD-10-CM | POA: Diagnosis not present

## 2018-10-14 ENCOUNTER — Other Ambulatory Visit: Payer: Self-pay | Admitting: Physician Assistant

## 2018-11-04 ENCOUNTER — Telehealth: Payer: Self-pay | Admitting: Cardiology

## 2018-11-04 NOTE — Telephone Encounter (Signed)

## 2018-11-05 ENCOUNTER — Other Ambulatory Visit: Payer: Self-pay

## 2018-11-05 ENCOUNTER — Encounter: Payer: Self-pay | Admitting: Cardiology

## 2018-11-05 ENCOUNTER — Ambulatory Visit (INDEPENDENT_AMBULATORY_CARE_PROVIDER_SITE_OTHER): Payer: Medicare Other | Admitting: Cardiology

## 2018-11-05 VITALS — BP 150/72 | HR 75 | Temp 97.4°F | Ht 63.0 in | Wt 216.0 lb

## 2018-11-05 DIAGNOSIS — I4819 Other persistent atrial fibrillation: Secondary | ICD-10-CM | POA: Diagnosis not present

## 2018-11-05 DIAGNOSIS — I10 Essential (primary) hypertension: Secondary | ICD-10-CM | POA: Diagnosis not present

## 2018-11-05 DIAGNOSIS — I428 Other cardiomyopathies: Secondary | ICD-10-CM | POA: Diagnosis not present

## 2018-11-05 DIAGNOSIS — I25119 Atherosclerotic heart disease of native coronary artery with unspecified angina pectoris: Secondary | ICD-10-CM

## 2018-11-05 NOTE — Progress Notes (Signed)
Cardiology Office Note  Date: 11/05/2018   ID: [name], [name] Dec 13, 1930, MRN [id number]  PCP:  Rory Percy, MD  Cardiologist:  Rozann Lesches, MD Electrophysiologist:  Constance Haw, MD   Chief Complaint  Patient presents with  . Cardiac follow-up     History of Present Illness: [name] is an 85 y.o. female last seen in July 2019 by Ms. Dunn PA-C.  Interval follow-up has been with Dr. Curt Bears, most recently in August of this year.  She presents today to reestablish cardiology follow-up.  She complains of fatigue, dyspnea on exertion and NYHA class II-III, intermittent vague chest discomfort, no substantial palpitations.  She has had no syncope.  I reviewed interval notes and recent cardiac monitor results.  She wore a Zio patch which did not demonstrate any obvious sustained atrial fibrillation with baseline rhythm being sinus.  She did have fairly frequent bursts of SVT, the longest lasting 11 seconds.  Rare ventricular ectopy was noted.  Echocardiogram from April of last year revealed LVEF of 25%, mildly reduced RV contraction, moderate mitral regurgitation with severe left atrial enlargement, and PASP estimated 30 mmHg.  Cardiac catheterization done at that time demonstrated only minimal CAD with patent RCA stent site, also reduced cardiac output and index.  Today we discussed her medications and need for follow-up evaluation of cardiac function.  We plan to proceed with an echocardiogram in anticipation of modifying her regimen (Entresto and Aldactone).  We may pursue low-dose beta-blocker after that, but with prior low output documented last year we will hold off for now.  I did talk with her briefly about the possibility of advanced heart failure clinic evaluation, but I would not anticipate an aggressive approach beyond optimizing medical therapy.  Past Medical History:  Diagnosis Date  . Arthritis   . Atrial flutter ([place])   . Bladder cancer ([place])    . Coronary atherosclerosis of native coronary artery    a. BMS RCA May 2014 - [place] stent. b. Cath 04/2017 - patent stent, minimal CAD otherwise.  . Depression   . Essential hypertension   . GI bleeding 06/2017   a. melena/small bowel ulcer by capsule endo 06/2017.  Marland Kitchen Hyperlipemia   . Macular degeneration   . NICM (nonischemic cardiomyopathy) ([place])   . Persistent atrial fibrillation ([place])   . Ulcerative colitis     Past Surgical History:  Procedure Laterality Date  . ABDOMINAL HYSTERECTOMY    . APPENDECTOMY    . Bilateral knee replacements      2007, 2008  . BLADDER SURGERY    . CARDIOVERSION N/A 02/04/2017   Procedure: CARDIOVERSION;  Surgeon: Arnoldo Lenis, MD;  [facility];  Service: Endoscopy;  Laterality: N/A;  . CARDIOVERSION N/A 02/26/2017   Procedure: CARDIOVERSION;  Surgeon: Satira Sark, MD;  Location: [facility];  Service: Cardiovascular;  Laterality: N/A;  . CARDIOVERSION N/A 08/07/2017   Procedure: CARDIOVERSION;  Surgeon: Pixie Casino, MD;  Location: [place];  Service: Cardiovascular;  Laterality: N/A;  . COLONOSCOPY N/A 06/15/2015   Procedure: COLONOSCOPY;  Surgeon: Rogene Houston, MD;  [facility];  Service: Endoscopy;  Laterality: N/A;  210  . CYSTOSCOPY W/ RETROGRADES Bilateral 05/14/2016   Procedure: CYSTOSCOPY WITH BILATERAL RETROGRADE PYELOGRAM;  Surgeon: Raynelle Bring, MD;  Location: [facility];  Service: Urology;  Laterality: Bilateral;  GENERAL ANESTHESIA WITH PARALYSIS  . ESOPHAGOGASTRODUODENOSCOPY (EGD) WITH PROPOFOL N/A 06/14/2017   Procedure: ESOPHAGOGASTRODUODENOSCOPY (EGD) WITH PROPOFOL;  Surgeon: Rogene Houston, MD;  [facility];  Service: Endoscopy;  Laterality: N/A;  . GIVENS CAPSULE STUDY  06/14/2017   Procedure: GIVENS CAPSULE STUDY;  Surgeon: Rogene Houston, MD;  [facility];  Service: Endoscopy;;  . LEFT HEART CATHETERIZATION WITH CORONARY ANGIOGRAM N/A 10/30/2013   Procedure: LEFT HEART  CATHETERIZATION WITH CORONARY ANGIOGRAM;  Surgeon: Burnell Blanks, MD;  Location: [facility] CATH LAB;  Service: Cardiovascular;  Laterality: N/A;  . RIGHT/LEFT HEART CATH AND CORONARY ANGIOGRAPHY N/A 05/03/2017   Procedure: RIGHT/LEFT HEART CATH AND CORONARY ANGIOGRAPHY;  Surgeon: Leonie Man, MD;  Location: [place] CV LAB;  Service: Cardiovascular;  Laterality: N/A;  . TEE WITHOUT CARDIOVERSION N/A 02/04/2017   Procedure: TRANSESOPHAGEAL ECHOCARDIOGRAM (TEE) WITH PROPOL;  Surgeon: Arnoldo Lenis, MD;  [facility];  Service: Endoscopy;  Laterality: N/A;  . TONSILLECTOMY    . TOTAL KNEE ARTHROPLASTY    . TRANSURETHRAL RESECTION OF BLADDER TUMOR N/A 05/14/2016   Procedure: TRANSURETHRAL RESECTION OF BLADDER TUMOR (TURBT);  Surgeon: Raynelle Bring, MD;  Location[facility];  Service: Urology;  Laterality: N/A;  GENERAL ANESTHESIA WITH PARALYSIS  . YAG LASER APPLICATION Bilateral 31/05/1759   Procedure: YAG LASER APPLICATION;  Surgeon: Williams Che, MD;  Location: [facility];  Service: Ophthalmology;  Laterality: Bilateral;    Current Outpatient Medications  Medication Sig Dispense Refill  . acetaminophen (TYLENOL) 500 MG tablet Take 1,000 mg by mouth every 6 (six) hours as needed for mild pain.     Marland Kitchen atorvastatin (LIPITOR) 20 MG tablet Take 1 tablet (20 mg total) by mouth at bedtime. 90 tablet 3  . balsalazide (COLAZAL) 750 MG capsule TAKE THREE CAPSULES BY MOUTH THREE TIMES DAILY 270 capsule 3  . Cholecalciferol (VITAMIN D-3) 125 MCG (5000 UT) TABS Take 1,000 Units by mouth.    . Cranberry 1000 MG CAPS Take 4,200 mg by mouth daily.    Marland Kitchen dofetilide (TIKOSYN) 250 MCG capsule Take 1 capsule (250 mcg total) by mouth 2 (two) times daily. 60 capsule 11  . furosemide (LASIX) 20 MG tablet TAKE TWO TABLETS BY MOUTH TWICE DAILY 360 tablet 0  . lisinopril (ZESTRIL) 5 MG tablet Take 1 tablet (5 mg total) by mouth daily. 90 tablet 1  . Multiple Vitamins-Minerals (ICAPS AREDS 2 PO) Take 2  tablets by mouth daily at 12 noon.     . potassium chloride SA (K-DUR) 20 MEQ tablet TAKE 1 TABLET BY MOUTH THREE TIMES DAILY 90 tablet 6  . XARELTO 20 MG TABS tablet TAKE 1 TABLET BY MOUTH DAILY WITH SUPPER 30 tablet 6   No current facility-administered medications for this visit.    Allergies:  Sinus & allergy [chlorpheniramine-phenylephrine], Carvedilol, Demerol, and Penicillins   Family History: The patient's family history includes CAD in her father.   ROS:  Please see the history of present illness. Otherwise, complete review of systems is positive for arthritic pains, bladder instability.  All other systems are reviewed and negative.   Physical Exam: VS:  BP (!) 150/72   Pulse 75   Temp (!) 97.4 F (36.3 C)   Ht 5' 3"  (1.6 m)   Wt 216 lb (98 kg)   SpO2 97%   BMI 38.26 kg/m , BMI Body mass index is 38.26 kg/m.  Wt Readings from Last 3 Encounters:  11/05/18 216 lb (98 kg)  08/26/18 218 lb 9.6 oz (99.2 kg)  06/11/18 211 lb 4.8 oz (95.8 kg)    General:  Elderly woman, appears comfortable at rest.  Using a cane. HEENT: Conjunctiva and lids normal, wearing a mask. Neck: Supple, no elevated JVP or carotid bruits, no thyromegaly. Lungs: Clear to auscultation, nonlabored breathing at rest. Cardiac: Indistinct PMI, RRR, no S3 or significant systolic murmur, no pericardial rub. Abdomen: Soft, nontender, bowel sounds present. Extremities: Mild ankle edema, distal pulses 2+. Skin: Warm and dry. Musculoskeletal: Mild kyphosis. Neuropsychiatric: Alert and oriented x3, affect grossly appropriate.  ECG:  An ECG dated 08/26/2018 was personally reviewed today and demonstrated:  Sinus rhythm with frequent PACs, left bundle branch block.  Recent Labwork: 06/11/2018: Hemoglobin 14.2; Platelets 309 08/26/2018: BUN 21; Creatinine, Ser 0.79; Magnesium 2.3; Potassium 5.1; Sodium 140   Other Studies Reviewed Today:  Right and left heart catheterization 05/03/2017:  Hemodynamic findings  consistent with mild secondary pulmonary hypertension.  Patient has severe nonischemic cardiomyopathy  Moderate to Severely reduced CO/CI  LV end diastolic pressure is moderately elevated.  Mid RCA stent widely patent  There is mild (2+) mitral regurgitation.   Angiographically minimal CAD Moderate to Severely reduced CO/CI with severely reduced EF on Echo  Hemodynamic findings consistent with mild pulmonary hypertension. Secondary PA P-mean: 42/22 mmHg - 30 mmHg PCWP 23 mmHg Elevated LV EDP consistent with volume overload. LVP-EDP: 105/13 mmHg - 24 mmHg Aortic Pressure-MAP: 108/66 mmHg - 83 mmHg Ao sat 99%, PA sat 55% Cardiac Output-Index:  **Fick: 4.36-2.25 **TD 3.55-1.3  Echocardiogram 04/25/2017: Study Conclusions  - Left ventricle: The cavity size was mildly to moderately dilated.   Wall thickness was normal. The estimated ejection fraction was   approximately 25%. Diffuse hypokinesis. There is akinesis of the   anteroseptal myocardium. There is akinesis of the   basal-midinferior myocardium. Indeterminate diastolic function. - Ventricular septum: Septal motion showed abnormal function and   dyssynergy. - Aortic valve: Mildly to moderately calcified annulus. Trileaflet. - Mitral valve: Mildly calcified annulus. Mildly thickened leaflets   . There was moderate regurgitation. - Left atrium: The atrium was severely dilated. - Right ventricle: Systolic function was mildly reduced. - Right atrium: The atrium was at the upper limits of normal in   size. Central venous pressure (est): 3 mm Hg. - Atrial septum: No defect or patent foramen ovale was identified. - Tricuspid valve: There was mild regurgitation. - Pulmonary arteries: PA peak pressure: 30 mm Hg (S). - Pericardium, extracardiac: There was no pericardial effusion.  Assessment and Plan:  1.  Nonischemic cardiomyopathy based on work-up from last year, LVEF approximately 25% with diffuse hypokinesis, and cardiac  catheterization demonstrating fairly minor coronary atherosclerosis with patent stent site in the RCA.  She did have evidence of low output at that time and moderately elevated pulmonary artery pressure.  Plan is to follow-up with a repeat echocardiogram in anticipation of modifying medical therapy.  If she has persistent LV dysfunction I would anticipate switching her from lisinopril to [facility] and also initiating Aldactone.  She is on Lasix with potassium supplements.  Will hold off beta-blocker for now until we get a better sense of how she is doing.  I did talk with her about potential referral to the advanced heart failure clinic, but overall I would anticipate conservative management.  Having said that, she does have a left bundle branch block now and it would at least be worth discussing the possibility of resynchronization therapy, even if this was just a biventricular pacemaker instead of ICD given her advanced age.  2.  Persistent atrial fibrillation, she continues on Tikosyn and  Xarelto with follow-up by Dr. Curt Bears.  Recent cardiac monitor reviewed.  3.  CAD status post BMS to the RCA in 2014, documented to be patent at angiography last year.  She continues on statin therapy.  4.  Essential hypertension, systolic is in the 247B today.  Further medication adjustments are anticipated.  Medication Adjustments/Labs and Tests Ordered: Current medicines are reviewed at length with the patient today.  Concerns regarding medicines are outlined above.   Tests Ordered: Orders Placed This Encounter  Procedures  . Basic metabolic panel  . ECHOCARDIOGRAM COMPLETE    Medication Changes: No orders of the defined types were placed in this encounter.   Disposition:  Follow up test results and determine disposition.  Signed, Satira Sark, MD, [facility] 11/05/2018 1:29 PM    [facility] [address], [place], [place] [address] Phone: [phone]; Fax: [phone]

## 2018-11-05 NOTE — Patient Instructions (Addendum)
Medication Instructions:   Your physician recommends that you continue on your current medications as directed. Please refer to the Current Medication list given to you today.   Labwork:  Your physician recommends that you return for non-fasting lab work as soon as possible to check your BMET. This can be done at [facility] or [facility] in [place].  Testing/Procedures: Your physician has requested that you have an echocardiogram. Echocardiography is a painless test that uses sound waves to create images of your heart. It provides your doctor with information about the size and shape of your heart and how well your heart's chambers and valves are working. This procedure takes approximately one hour. There are no restrictions for this procedure.  Follow-Up:  Your physician recommends that you schedule a follow-up appointment in: pending test result.  Any Other Special Instructions Will Be Listed Below (If Applicable).  If you need a refill on your cardiac medications before your next appointment, please call your pharmacy.

## 2018-11-06 ENCOUNTER — Telehealth: Payer: Self-pay | Admitting: Cardiology

## 2018-11-06 ENCOUNTER — Ambulatory Visit (INDEPENDENT_AMBULATORY_CARE_PROVIDER_SITE_OTHER): Payer: Medicare Other

## 2018-11-06 DIAGNOSIS — I428 Other cardiomyopathies: Secondary | ICD-10-CM

## 2018-11-06 LAB — BASIC METABOLIC PANEL
BUN: 17 mg/dL (ref 7–25)
CO2: 24 mmol/L (ref 20–32)
Calcium: 9.9 mg/dL (ref 8.6–10.4)
Chloride: 103 mmol/L (ref 98–110)
Creat: 0.76 mg/dL (ref 0.60–0.88)
Glucose, Bld: 92 mg/dL (ref 65–139)
Potassium: 4.4 mmol/L (ref 3.5–5.3)
Sodium: 139 mmol/L (ref 135–146)

## 2018-11-06 NOTE — Telephone Encounter (Signed)
Pre-cert Verification for the following procedure    Echo scheduled for 11-07-2018 at [facility].

## 2018-11-07 ENCOUNTER — Other Ambulatory Visit (HOSPITAL_COMMUNITY): Payer: Medicare Other

## 2018-11-07 ENCOUNTER — Telehealth: Payer: Self-pay | Admitting: *Deleted

## 2018-11-07 ENCOUNTER — Other Ambulatory Visit: Payer: Self-pay | Admitting: *Deleted

## 2018-11-07 DIAGNOSIS — I5042 Chronic combined systolic (congestive) and diastolic (congestive) heart failure: Secondary | ICD-10-CM

## 2018-11-07 DIAGNOSIS — I10 Essential (primary) hypertension: Secondary | ICD-10-CM | POA: Diagnosis not present

## 2018-11-07 MED ORDER — SPIRONOLACTONE 25 MG PO TABS: 12.5000 mg | ORAL_TABLET | Freq: Every day | ORAL | 3 refills | Status: DC

## 2018-11-07 MED ORDER — ENTRESTO 24-26 MG PO TABS: 1.0000 | ORAL_TABLET | Freq: Two times a day (BID) | ORAL | 6 refills | Status: DC

## 2018-11-07 NOTE — Telephone Encounter (Signed)
-----   Message from Satira Sark, MD sent at 11/06/2018  7:54 AM EDT ----- Results reviewed.  Renal function and potassium are normal.  We will follow up on echocardiogram and make medication changes from there most likely.

## 2018-11-07 NOTE — Telephone Encounter (Signed)
-----   Message from Satira Sark, MD sent at 11/06/2018  5:00 PM EDT ----- Results reviewed.  No substantial change in LVEF, still 20 to 25% range.  Based on recent discussion I would suggest that we stop lisinopril and convert her to Entresto 24/26 mg twice daily after appropriate washout.  Also start Aldactone 12.5 mg daily.  She will need a BMET in 10 days.  Please schedule office follow-up in the next 4 to 6 weeks.

## 2018-11-07 NOTE — Telephone Encounter (Signed)
Patient informed and verbalized understanding of plan. Copy sen to PCP

## 2018-11-20 DIAGNOSIS — I5042 Chronic combined systolic (congestive) and diastolic (congestive) heart failure: Secondary | ICD-10-CM | POA: Diagnosis not present

## 2018-11-21 ENCOUNTER — Telehealth: Payer: Self-pay | Admitting: *Deleted

## 2018-11-21 LAB — BASIC METABOLIC PANEL
BUN/Creatinine Ratio: 26 (calc) — ABNORMAL HIGH (ref 6–22)
BUN: 25 mg/dL (ref 7–25)
CO2: 22 mmol/L (ref 20–32)
Calcium: 9.9 mg/dL (ref 8.6–10.4)
Chloride: 100 mmol/L (ref 98–110)
Creat: 0.96 mg/dL — ABNORMAL HIGH (ref 0.60–0.88)
Glucose, Bld: 105 mg/dL (ref 65–139)
Potassium: 4.6 mmol/L (ref 3.5–5.3)
Sodium: 136 mmol/L (ref 135–146)

## 2018-11-21 NOTE — Telephone Encounter (Signed)
Patient informed. Copy sent to PCP °

## 2018-11-21 NOTE — Telephone Encounter (Signed)
-----   Message from Satira Sark, MD sent at 11/21/2018  7:55 AM EST ----- Results reviewed.  Minor change in creatinine from 0.76-0.96 and normal potassium after recent medication adjustments.  Continue with same for now.

## 2018-11-28 ENCOUNTER — Other Ambulatory Visit: Payer: Self-pay

## 2018-11-28 ENCOUNTER — Ambulatory Visit: Payer: Medicare Other | Admitting: Cardiology

## 2018-11-28 ENCOUNTER — Encounter: Payer: Self-pay | Admitting: Cardiology

## 2018-11-28 VITALS — BP 140/72 | HR 42 | Ht 63.0 in | Wt 220.4 lb

## 2018-11-28 DIAGNOSIS — I428 Other cardiomyopathies: Secondary | ICD-10-CM

## 2018-11-28 NOTE — Progress Notes (Signed)
Electrophysiology Office Note   Date:  11/28/2018   ID:  [name], [name] Jun 14, 1930, MRN [id number]  PCP:  Rory Percy, MD  Cardiologist:  Domenic Polite Primary Electrophysiologist:  Tekisha Darcey Meredith Leeds, MD    No chief complaint on file.    History of Present Illness: [name] is a 85 y.o. female who is being seen today for the evaluation of atrial fibrillation at the request of Rory Percy, MD. Presenting today for electrophysiology evaluation.  She has a history of atrial flutter, coronary artery disease status post RCA stent in 2014, paroxysmal atrial fibrillation, and nonischemic cardiomyopathy.  She does have atrial fibrillation and is failed cardioversion and amiodarone.  Most recent echo showed an EF of 25% with catheterization showing a likely nonischemic cardiomyopathy.  She has weakness in class III dyspnea.  Today, denies symptoms of palpitations, chest pain, shortness of breath, orthopnea, PND, lower extremity edema, claudication, dizziness, presyncope, syncope, bleeding, or neurologic sequela. The patient is tolerating medications without difficulties.  She continues to complain of weakness and fatigue.  Her ejection fraction has remained low despite repeat echo.  She does have an interventricular conduction delay, but evidence of abnormal ventricular septal motion on her echo.  Past Medical History:  Diagnosis Date  . Arthritis   . Atrial flutter ([place])   . Bladder cancer ([place])   . Coronary atherosclerosis of native coronary artery    a. BMS RCA May 2014 - [place] stent. b. Cath 04/2017 - patent stent, minimal CAD otherwise.  . Depression   . Essential hypertension   . GI bleeding 06/2017   a. melena/small bowel ulcer by capsule endo 06/2017.  Marland Kitchen Hyperlipemia   . Macular degeneration   . NICM (nonischemic cardiomyopathy) ([place])   . Persistent atrial fibrillation ([place])   . Ulcerative colitis    Past Surgical History:  Procedure Laterality Date  .  ABDOMINAL HYSTERECTOMY    . APPENDECTOMY    . Bilateral knee replacements      2007, 2008  . BLADDER SURGERY    . CARDIOVERSION N/A 02/04/2017   Procedure: CARDIOVERSION;  Surgeon: Arnoldo Lenis, MD;  [facility];  Service: Endoscopy;  Laterality: N/A;  . CARDIOVERSION N/A 02/26/2017   Procedure: CARDIOVERSION;  Surgeon: Satira Sark, MD;  [facility]: [facility];  Service: Cardiovascular;  Laterality: N/A;  . CARDIOVERSION N/A 08/07/2017   Procedure: CARDIOVERSION;  Surgeon: Pixie Casino, MD;  Location: [place];  Service: Cardiovascular;  Laterality: N/A;  . COLONOSCOPY N/A 06/15/2015   Procedure: COLONOSCOPY;  Surgeon: Rogene Houston, MD;  [facility];  Service: Endoscopy;  Laterality: N/A;  210  . CYSTOSCOPY W/ RETROGRADES Bilateral 05/14/2016   Procedure: CYSTOSCOPY WITH BILATERAL RETROGRADE PYELOGRAM;  Surgeon: Raynelle Bring, MD;  [facility];  Service: Urology;  Laterality: Bilateral;  GENERAL ANESTHESIA WITH PARALYSIS  . ESOPHAGOGASTRODUODENOSCOPY (EGD) WITH PROPOFOL N/A 06/14/2017   Procedure: ESOPHAGOGASTRODUODENOSCOPY (EGD) WITH PROPOFOL;  Surgeon: Rogene Houston, MD;  [facility];  Service: Endoscopy;  Laterality: N/A;  . GIVENS CAPSULE STUDY  06/14/2017   Procedure: GIVENS CAPSULE STUDY;  Surgeon: Rogene Houston, MD;  [facility];  Service: Endoscopy;;  . LEFT HEART CATHETERIZATION WITH CORONARY ANGIOGRAM N/A 10/30/2013   Procedure: LEFT HEART CATHETERIZATION WITH CORONARY ANGIOGRAM;  Surgeon: Burnell Blanks, MD;  Location: [facility] CATH LAB;  Service: Cardiovascular;  Laterality: N/A;  . RIGHT/LEFT HEART CATH AND CORONARY ANGIOGRAPHY N/A 05/03/2017   Procedure: RIGHT/LEFT  HEART CATH AND CORONARY ANGIOGRAPHY;  Surgeon: Leonie Man, MD;  Location: [facility] CV LAB;  Service: Cardiovascular;  Laterality: N/A;  . TEE WITHOUT CARDIOVERSION N/A 02/04/2017   Procedure: TRANSESOPHAGEAL ECHOCARDIOGRAM (TEE) WITH PROPOL;   Surgeon: Arnoldo Lenis, MD;  [facility];  Service: Endoscopy;  Laterality: N/A;  . TONSILLECTOMY    . TOTAL KNEE ARTHROPLASTY    . TRANSURETHRAL RESECTION OF BLADDER TUMOR N/A 05/14/2016   Procedure: TRANSURETHRAL RESECTION OF BLADDER TUMOR (TURBT);  Surgeon: Raynelle Bring, MD;  [facility];  Service: Urology;  Laterality: N/A;  GENERAL ANESTHESIA WITH PARALYSIS  . YAG LASER APPLICATION Bilateral 76/07/3417   Procedure: YAG LASER APPLICATION;  Surgeon: Williams Che, MD;  [facility];  Service: Ophthalmology;  Laterality: Bilateral;     Current Outpatient Medications  Medication Sig Dispense Refill  . acetaminophen (TYLENOL) 500 MG tablet Take 1,000 mg by mouth every 6 (six) hours as needed for mild pain.     Marland Kitchen atorvastatin (LIPITOR) 20 MG tablet Take 1 tablet (20 mg total) by mouth at bedtime. 90 tablet 3  . balsalazide (COLAZAL) 750 MG capsule TAKE THREE CAPSULES BY MOUTH THREE TIMES DAILY 270 capsule 3  . Cholecalciferol (VITAMIN D-3) 125 MCG (5000 UT) TABS Take 1,000 Units by mouth.    . Cranberry 1000 MG CAPS Take 4,200 mg by mouth daily.    Marland Kitchen dofetilide (TIKOSYN) 250 MCG capsule Take 1 capsule (250 mcg total) by mouth 2 (two) times daily. 60 capsule 11  . furosemide (LASIX) 20 MG tablet TAKE TWO TABLETS BY MOUTH TWICE DAILY 360 tablet 0  . Multiple Vitamins-Minerals (ICAPS AREDS 2 PO) Take 2 tablets by mouth daily at 12 noon.     . potassium chloride SA (K-DUR) 20 MEQ tablet TAKE 1 TABLET BY MOUTH THREE TIMES DAILY 90 tablet 6  . sacubitril-valsartan (ENTRESTO) 24-26 MG Take 1 tablet by mouth 2 (two) times daily. Stop lisinopril-start entresto 48 hours after stopping lisinopril 60 tablet 6  . spironolactone (ALDACTONE) 25 MG tablet Take 0.5 tablets (12.5 mg total) by mouth daily for 90 doses. 45 tablet 3  . XARELTO 20 MG TABS tablet TAKE 1 TABLET BY MOUTH DAILY WITH SUPPER 30 tablet 6   No current facility-administered medications for this visit.      Allergies:   Sinus & allergy [chlorpheniramine-phenylephrine], Carvedilol, Demerol, and Penicillins   Social History:  The patient  reports that she has never smoked. She has never used smokeless tobacco. She reports that she does not drink alcohol or use drugs.   Family History:  The patient's family history includes CAD in her father.   ROS:  Please see the history of present illness.   Otherwise, review of systems is positive for none.   All other systems are reviewed and negative.   PHYSICAL EXAM: VS:  BP 140/72   Pulse (!) 42   Ht 5' 3"  (1.6 m)   Wt 220 lb 6.4 oz (100 kg)   SpO2 97%   BMI 39.04 kg/m  , BMI Body mass index is 39.04 kg/m. GEN: Well nourished, well developed, in no acute distress  HEENT: normal  Neck: no JVD, carotid bruits, or masses Cardiac: RRR; no murmurs, rubs, or gallops,no edema  Respiratory:  clear to auscultation bilaterally, normal work of breathing GI: soft, nontender, nondistended, + BS MS: no deformity or atrophy  Skin: warm and dry Neuro:  Strength and sensation are intact Psych: euthymic mood, full affect  EKG:  EKG is not ordered today. Personal review of the ekg ordered 08/26/18 shows sinus rhythm, IVCD, rate 88, QRS 156 ms  Recent Labs: 06/11/2018: Hemoglobin 14.2; Platelets 309 08/26/2018: Magnesium 2.3 11/20/2018: BUN 25; Creat 0.96; Potassium 4.6; Sodium 136    Lipid Panel  No results found for: CHOL, TRIG, HDL, CHOLHDL, VLDL, LDLCALC, LDLDIRECT   Wt Readings from Last 3 Encounters:  11/28/18 220 lb 6.4 oz (100 kg)  11/05/18 216 lb (98 kg)  08/26/18 218 lb 9.6 oz (99.2 kg)      Other studies Reviewed: Additional studies/ records that were reviewed today include: TTE 11/06/18 Review of the above records today demonstrates:   1. Left ventricular ejection fraction, by visual estimation, is 20 to 25%. The left ventricle has severely decreased function. There is mildly increased left ventricular hypertrophy. There is diffuse  hypokinesis.  2. Abnormal septal motion consistent with left bundle branch block.  3. Left ventricular diastolic parameters are consistent with Grade I diastolic dysfunction (impaired relaxation).  4. Global right ventricle has normal systolic function.The right ventricular size is normal. No increase in right ventricular wall thickness.  5. Left atrial size was normal.  6. Right atrial size was normal.  7. Mild to moderate mitral annular calcification.  8. Moderate aortic valve annular calcification.  9. The mitral valve is grossly normal. Mild mitral valve regurgitation. 10. The tricuspid valve is grossly normal. Tricuspid valve regurgitation is trivial. 11. The aortic valve is tricuspid. Aortic valve regurgitation is not visualized. 12. The pulmonic valve was grossly normal. Pulmonic valve regurgitation is mild. 13. The inferior vena cava is normal in size with greater than 50% respiratory variability, suggesting right atrial pressure of 3 mmHg. 14. TR signal is inadequate for assessing pulmonary artery systolic pressure.  LHC 05/03/17  Hemodynamic findings consistent with mild secondary pulmonary hypertension.  Patient has severe nonischemic cardiomyopathy  Moderate to Severely reduced CO/CI  LV end diastolic pressure is moderately elevated.  Mid RCA stent widely patent  There is mild (2+) mitral regurgitation.  [place] 09/16/18 personally reviewed Max 197 bpm 09:12am, 08/24 Min 38 bpm 03:09am, 08/24 Avg 68 bpm Rare PVCs Frequent PACs Atrial fibrillation: None Multiple runs of SVT, longest 11 seconds, fastest rate 197 for 8 beats  ASSESSMENT AND PLAN:  1.  Persistent atrial fibrillation: Currently on Xarelto and dofetilide.  Remains in sinus rhythm  This patients CHA2DS2-VASc Score and unadjusted Ischemic Stroke Rate (% per year) is equal to 4.8 % stroke rate/year from a score of 4  Above score calculated as 1 point each if present [CHF, HTN, DM, Vascular=MI/PAD/Aortic  Plaque, Age if 65-74, or Female] Above score calculated as 2 points each if present [Age > 75, or Stroke/TIA/TE]   2.  Nonischemic cardiomyopathy: Her ejection fraction has remained stable.  She has developed an interventricular conduction delay with a left bundle branch block type morphology.  As this is not a complete left bundle branch block, I am not convinced that she would get a full amount of benefit from biventricular pacing.  That being said, she had abnormal septal motion consistent with left bundle branch block.  I Dervin Vore discuss this with her primary cardiologist to see if by echo she may benefit from BiV pacing.  3.  Coronary artery disease: Status post RCA stent in 2014.  No current chest pain.  Current medicines are reviewed at length with the patient today.   The patient does not have concerns regarding her medicines.  The following  changes were made today: None  Labs/ tests ordered today include:  No orders of the defined types were placed in this encounter.   Disposition:   FU with Rebakah Cokley 6 months  Signed, Malaquias Lenker Meredith Leeds, MD  11/28/2018 11:53 AM     [facility] [address] [place] [address] [phone] (office) [phone] (fax)

## 2018-12-12 ENCOUNTER — Telehealth: Payer: Self-pay | Admitting: *Deleted

## 2018-12-12 NOTE — Telephone Encounter (Signed)
Spoke to pt about scheduling CRT-D implant 01/28/19. Pt is agreeable if Dr. Domenic Polite and [facility] feel its best. She will discuss it more with Dr. Domenic Polite at next weeks OV. Aware I will follow up to arrange OV w/ Dr. Curt Bears prior to procedure.

## 2018-12-18 ENCOUNTER — Ambulatory Visit: Payer: Medicare Other | Admitting: Cardiology

## 2018-12-18 ENCOUNTER — Encounter: Payer: Self-pay | Admitting: Cardiology

## 2018-12-18 ENCOUNTER — Other Ambulatory Visit: Payer: Self-pay

## 2018-12-18 VITALS — BP 130/72 | HR 82 | Ht 63.0 in | Wt 225.0 lb

## 2018-12-18 DIAGNOSIS — I428 Other cardiomyopathies: Secondary | ICD-10-CM | POA: Diagnosis not present

## 2018-12-18 DIAGNOSIS — I5022 Chronic systolic (congestive) heart failure: Secondary | ICD-10-CM

## 2018-12-18 DIAGNOSIS — I25119 Atherosclerotic heart disease of native coronary artery with unspecified angina pectoris: Secondary | ICD-10-CM

## 2018-12-18 DIAGNOSIS — I4819 Other persistent atrial fibrillation: Secondary | ICD-10-CM | POA: Diagnosis not present

## 2018-12-18 NOTE — Progress Notes (Signed)
Cardiology Office Note  Date: 12/18/2018   ID: Karelly, Dewalt 1930/05/02, MRN [id number]  PCP:  Rory Percy, MD  Cardiologist:  Rozann Lesches, MD Electrophysiologist:  Constance Haw, MD   Chief Complaint  Patient presents with  . Cardiac follow-up    History of Present Illness: [name] is an 85 y.o. female last seen in October.  She presents for a routine visit.  Reports NYHA class II-III dyspnea on exertion, no angina or significant palpitations.  Her weight is up about 5 pounds and she has had some leg swelling.  She is using a walker due to balance problems.  She was evaluated by Dr. Curt Bears recently for discussion regarding biventricular pacing in the setting of nonischemic cardiomyopathy with LVEF 25%, and left bundle branch block with QRS duration 156 ms.  Procedure tentatively scheduled for January.  I did talk with her about the possibility that this may not lead to a definitive improvement in symptoms or LVEF, but it is a possibility and an adjunct to her current therapy.  I am not sure that an ICD would make sense at age 26 however.  I reviewed her current medications.  She remains on Entresto, Aldactone, Xarelto, Lipitor, and Tikosyn.  Lasix has been at 40 mg twice daily with potassium supplements.  Past Medical History:  Diagnosis Date  . Arthritis   . Atrial flutter ([place])   . Bladder cancer ([place])   . Coronary atherosclerosis of native coronary artery    a. BMS RCA May 2014 - [place] stent. b. Cath 04/2017 - patent stent, minimal CAD otherwise.  . Depression   . Essential hypertension   . GI bleeding 06/2017   a. melena/small bowel ulcer by capsule endo 06/2017.  Marland Kitchen Hyperlipemia   . Macular degeneration   . NICM (nonischemic cardiomyopathy) ([place])   . Persistent atrial fibrillation ([place])   . Ulcerative colitis     Past Surgical History:  Procedure Laterality Date  . ABDOMINAL HYSTERECTOMY    . APPENDECTOMY    . Bilateral knee  replacements      2007, 2008  . BLADDER SURGERY    . CARDIOVERSION N/A 02/04/2017   Procedure: CARDIOVERSION;  Surgeon: Arnoldo Lenis, MD;  [facility];  Service: Endoscopy;  Laterality: N/A;  . CARDIOVERSION N/A 02/26/2017   Procedure: CARDIOVERSION;  Surgeon: Satira Sark, MD;  [facility];  Service: Cardiovascular;  Laterality: N/A;  . CARDIOVERSION N/A 08/07/2017   Procedure: CARDIOVERSION;  Surgeon: Pixie Casino, MD;  Location: [place];  Service: Cardiovascular;  Laterality: N/A;  . COLONOSCOPY N/A 06/15/2015   Procedure: COLONOSCOPY;  Surgeon: Rogene Houston, MD;  [facility];  Service: Endoscopy;  Laterality: N/A;  210  . CYSTOSCOPY W/ RETROGRADES Bilateral 05/14/2016   Procedure: CYSTOSCOPY WITH BILATERAL RETROGRADE PYELOGRAM;  Surgeon: Raynelle Bring, MD;  [facility];  Service: Urology;  Laterality: Bilateral;  GENERAL ANESTHESIA WITH PARALYSIS  . ESOPHAGOGASTRODUODENOSCOPY (EGD) WITH PROPOFOL N/A 06/14/2017   Procedure: ESOPHAGOGASTRODUODENOSCOPY (EGD) WITH PROPOFOL;  Surgeon: Rogene Houston, MD;  [facility];  Service: Endoscopy;  Laterality: N/A;  . GIVENS CAPSULE STUDY  06/14/2017   Procedure: GIVENS CAPSULE STUDY;  Surgeon: Rogene Houston, MD;  [facility];  Service: Endoscopy;;  . LEFT HEART CATHETERIZATION WITH CORONARY ANGIOGRAM N/A 10/30/2013   Procedure: LEFT HEART CATHETERIZATION WITH CORONARY ANGIOGRAM;  Surgeon: Burnell Blanks, MD;  Location: [facility] CATH LAB;  Service:  Cardiovascular;  Laterality: N/A;  . RIGHT/LEFT HEART CATH AND CORONARY ANGIOGRAPHY N/A 05/03/2017   Procedure: RIGHT/LEFT HEART CATH AND CORONARY ANGIOGRAPHY;  Surgeon: Leonie Man, MD;  Location: [place] CV LAB;  Service: Cardiovascular;  Laterality: N/A;  . TEE WITHOUT CARDIOVERSION N/A 02/04/2017   Procedure: TRANSESOPHAGEAL ECHOCARDIOGRAM (TEE) WITH PROPOL;  Surgeon: Arnoldo Lenis, MD;  [facility];   Service: Endoscopy;  Laterality: N/A;  . TONSILLECTOMY    . TOTAL KNEE ARTHROPLASTY    . TRANSURETHRAL RESECTION OF BLADDER TUMOR N/A 05/14/2016   Procedure: TRANSURETHRAL RESECTION OF BLADDER TUMOR (TURBT);  Surgeon: Raynelle Bring, MD;  [facility];  Service: Urology;  Laterality: N/A;  GENERAL ANESTHESIA WITH PARALYSIS  . YAG LASER APPLICATION Bilateral 83/03/8248   Procedure: YAG LASER APPLICATION;  Surgeon: Williams Che, MD;  [facility];  Service: Ophthalmology;  Laterality: Bilateral;    Current Outpatient Medications  Medication Sig Dispense Refill  . acetaminophen (TYLENOL) 500 MG tablet Take 1,000 mg by mouth every 6 (six) hours as needed for mild pain.     Marland Kitchen atorvastatin (LIPITOR) 20 MG tablet Take 1 tablet (20 mg total) by mouth at bedtime. 90 tablet 3  . balsalazide (COLAZAL) 750 MG capsule TAKE THREE CAPSULES BY MOUTH THREE TIMES DAILY 270 capsule 3  . Cholecalciferol (VITAMIN D-3) 125 MCG (5000 UT) TABS Take 1,000 Units by mouth.    . Cranberry 1000 MG CAPS Take 4,200 mg by mouth daily.    Marland Kitchen dofetilide (TIKOSYN) 250 MCG capsule Take 1 capsule (250 mcg total) by mouth 2 (two) times daily. 60 capsule 11  . furosemide (LASIX) 20 MG tablet TAKE TWO TABLETS BY MOUTH TWICE DAILY 360 tablet 0  . Multiple Vitamins-Minerals (ICAPS AREDS 2 PO) Take 2 tablets by mouth daily at 12 noon.     . potassium chloride SA (K-DUR) 20 MEQ tablet TAKE 1 TABLET BY MOUTH THREE TIMES DAILY 90 tablet 6  . sacubitril-valsartan (ENTRESTO) 24-26 MG Take 1 tablet by mouth 2 (two) times daily. Stop lisinopril-start entresto 48 hours after stopping lisinopril 60 tablet 6  . spironolactone (ALDACTONE) 25 MG tablet Take 0.5 tablets (12.5 mg total) by mouth daily for 90 doses. 45 tablet 3  . XARELTO 20 MG TABS tablet TAKE 1 TABLET BY MOUTH DAILY WITH SUPPER 30 tablet 6   No current facility-administered medications for this visit.   Allergies:  Sinus & allergy [chlorpheniramine-phenylephrine],  Carvedilol, Demerol, and Penicillins   Social History: The patient  reports that she has never smoked. She has never used smokeless tobacco. She reports that she does not drink alcohol or use drugs.   Family History: The patient's family history includes CAD in her father.   ROS:  Please see the history of present illness. Otherwise, complete review of systems is positive for balance difficulties at times.  All other systems are reviewed and negative.   Physical Exam: VS:  BP 130/72   Pulse 82   Ht 5' 3"  (1.6 m)   Wt 225 lb (102.1 kg)   SpO2 97%   BMI 39.86 kg/m , BMI Body mass index is 39.86 kg/m.  Wt Readings from Last 3 Encounters:  12/18/18 225 lb (102.1 kg)  11/28/18 220 lb 6.4 oz (100 kg)  11/05/18 216 lb (98 kg)    General: Elderly woman, using a walker.  Patient appears comfortable at rest. HEENT: Conjunctiva and lids normal, wearing a mask. Neck: Supple, no elevated JVP. Lungs: Clear to auscultation,  nonlabored breathing at rest. Cardiac: Regular rate and rhythm with occasional ectopy, no S3 or significant systolic murmur. Abdomen: Soft, nontender, bowel sounds present. Extremities: 2+ lower leg edema, distal pulses 2+. Skin: Warm and dry. Musculoskeletal: No kyphosis. Neuropsychiatric: Alert and oriented x3, affect grossly appropriate.  ECG:  An ECG dated 08/26/2018 was personally reviewed today and demonstrated:  Sinus rhythm with PACs and left bundle branch block.  Recent Labwork: 06/11/2018: Hemoglobin 14.2; Platelets 309 08/26/2018: Magnesium 2.3 11/20/2018: BUN 25; Creat 0.96; Potassium 4.6; Sodium 136    Other Studies Reviewed Today:  Echocardiogram 11/06/2018:  1. Left ventricular ejection fraction, by visual estimation, is 20 to 25%. The left ventricle has severely decreased function. There is mildly increased left ventricular hypertrophy. There is diffuse hypokinesis.  2. Abnormal septal motion consistent with left bundle branch block.  3. Left ventricular  diastolic parameters are consistent with Grade I diastolic dysfunction (impaired relaxation).  4. Global right ventricle has normal systolic function.The right ventricular size is normal. No increase in right ventricular wall thickness.  5. Left atrial size was normal.  6. Right atrial size was normal.  7. Mild to moderate mitral annular calcification.  8. Moderate aortic valve annular calcification.  9. The mitral valve is grossly normal. Mild mitral valve regurgitation. 10. The tricuspid valve is grossly normal. Tricuspid valve regurgitation is trivial. 11. The aortic valve is tricuspid. Aortic valve regurgitation is not visualized. 12. The pulmonic valve was grossly normal. Pulmonic valve regurgitation is mild. 13. The inferior vena cava is normal in size with greater than 50% respiratory variability, suggesting right atrial pressure of 3 mmHg. 14. TR signal is inadequate for assessing pulmonary artery systolic pressure.  Assessment and Plan:  1.  Chronic systolic heart failure.  Weight is up about 5 pounds.  We will increase Lasix to 60 mg twice daily for a few days and then resume prior dose and potassium supplementation.  No change to Entresto or Aldactone.  2.  Nonischemic cardiomyopathy with LVEF 20 to 25%, IVCD of left bundle branch block type with QRS duration 156 ms.  Although not a guarantee in terms of improving symptoms and LVEF, consideration for biventricular pacing remains an option for her.  She is still contemplating this and will discuss with Dr. Curt Bears again.  I think it is not an unreasonable thing to consider if she is willing, would not pursue an ICD necessarily however.  3.  Persistent atrial fibrillation.  She remains on Tikosyn and Xarelto.  No active palpitations.  4.  CAD status post BMS to the RCA in 2014.  No active angina symptoms.  She is not on aspirin given concurrent use of Xarelto.  Continue statin therapy.  Medication Adjustments/Labs and Tests Ordered:  Current medicines are reviewed at length with the patient today.  Concerns regarding medicines are outlined above.   Tests Ordered: No orders of the defined types were placed in this encounter.   Medication Changes: No orders of the defined types were placed in this encounter.   Disposition:  Follow up 6 weeks in the [place] office.  Signed, Satira Sark, MD, [facility] 12/18/2018 2:42 PM    [facility] [place], [place], [place] [address] Phone: [phone]; Fax: [phone]

## 2018-12-18 NOTE — Patient Instructions (Addendum)
Medication Instructions:   Your physician has recommended you make the following change in your medication:   Take furosemide 3 tablets twice daily for 2 days, then resume previous dose  Continue other medications the same  Labwork:  NONE  Testing/Procedures:  NONE  Follow-Up:  Your physician recommends that you schedule a follow-up appointment in: 6 weeks (office).  Any Other Special Instructions Will Be Listed Below (If Applicable).  If you need a refill on your cardiac medications before your next appointment, please call your pharmacy.

## 2019-01-14 ENCOUNTER — Other Ambulatory Visit: Payer: Self-pay | Admitting: Physician Assistant

## 2019-01-14 NOTE — Telephone Encounter (Signed)
This is a Nurse, mental health pt

## 2019-01-21 ENCOUNTER — Telehealth: Payer: Self-pay | Admitting: *Deleted

## 2019-01-21 NOTE — Telephone Encounter (Signed)
Spoke to [name] about CRT implant scheduled for next week.   She is hesitant w/ current pandemic surge and would like to hold off for several months, if safe to do so. States she sees Dr. Domenic Polite tomorrow and will discuss this more with him. Informed that I would follow up on Friday to see if need to cancel scheduled procedure for 1/20 and schedule her for follow up w/ Dr. Curt Bears in March/April to discuss re-scheduling at a "safer time". [name] agreeable to plan.  Will forward to Dr. Domenic Polite for his [facility] for appt tomorrow.

## 2019-01-21 NOTE — Telephone Encounter (Signed)
After speaking w/ Dr. Curt Bears -- he said ok to hold off on CRT. Pt scheduled 4/6 to see [place] and reschedule procedure. Advised to call the office if she has any issues w/ AFib between now and April OV. Patient verbalized understanding and agreeable to plan.    Will forward back to Dr. Domenic Polite for his [facility]

## 2019-01-22 ENCOUNTER — Encounter: Payer: Self-pay | Admitting: Cardiology

## 2019-01-22 ENCOUNTER — Other Ambulatory Visit: Payer: Self-pay

## 2019-01-22 ENCOUNTER — Ambulatory Visit: Payer: Medicare Other | Admitting: Cardiology

## 2019-01-22 VITALS — BP 158/78 | HR 74 | Ht 63.0 in | Wt 224.0 lb

## 2019-01-22 DIAGNOSIS — I25119 Atherosclerotic heart disease of native coronary artery with unspecified angina pectoris: Secondary | ICD-10-CM | POA: Diagnosis not present

## 2019-01-22 DIAGNOSIS — I4819 Other persistent atrial fibrillation: Secondary | ICD-10-CM | POA: Diagnosis not present

## 2019-01-22 DIAGNOSIS — I428 Other cardiomyopathies: Secondary | ICD-10-CM | POA: Diagnosis not present

## 2019-01-22 DIAGNOSIS — I5022 Chronic systolic (congestive) heart failure: Secondary | ICD-10-CM | POA: Diagnosis not present

## 2019-01-22 NOTE — Patient Instructions (Addendum)
Medication Instructions:   Your physician recommends that you continue on your current medications as directed. Please refer to the Current Medication list given to you today.  Labwork:  NONE  Testing/Procedures: Your physician has requested that you have an echocardiogram in March 2021. Echocardiography is a painless test that uses sound waves to create images of your heart. It provides your doctor with information about the size and shape of your heart and how well your heart's chambers and valves are working. This procedure takes approximately one hour. There are no restrictions for this procedure.  Follow-Up:  Your physician recommends that you schedule a follow-up appointment in: 3 months (office).  Any Other Special Instructions Will Be Listed Below (If Applicable).  If you need a refill on your cardiac medications before your next appointment, please call your pharmacy.

## 2019-01-22 NOTE — Progress Notes (Signed)
Cardiology Office Note  Date: 01/22/2019   ID: [name], [name] [birth year], MRN [id number]  PCP:  Rory Percy, MD  Cardiologist:  Rozann Lesches, MD Electrophysiologist:  Constance Haw, MD   Chief Complaint  Patient presents with  . Cardiac follow-up    History of Present Illness: [name] is an 86 y.o. female last seen in December 2020.  She presents for a routine follow-up visit.  She tells me that she has been feeling better in terms of energy and stamina over the last week or so.  She reports compliance with her current medications as outlined below.  Presently describing NYHA class II dyspnea, no chest pain, palpitations, or syncope.  She has elected not to pursue CRT implant with Dr. Curt Bears at this time, prefers to defer until the coronavirus situation has settled down further.  She will see him back in early April.  I talked with her about getting a follow-up echocardiogram in March for reassessment prior to that visit.  Past Medical History:  Diagnosis Date  . Arthritis   . Atrial flutter ([place])   . Bladder cancer ([place])   . Coronary atherosclerosis of native coronary artery    a. BMS RCA May 2014 - [place] stent. b. Cath 04/2017 - patent stent, minimal CAD otherwise.  . Depression   . Essential hypertension   . GI bleeding 06/2017   a. melena/small bowel ulcer by capsule endo 06/2017.  Marland Kitchen Hyperlipemia   . Macular degeneration   . NICM (nonischemic cardiomyopathy) ([place])   . Persistent atrial fibrillation ([place])   . Ulcerative colitis     Past Surgical History:  Procedure Laterality Date  . ABDOMINAL HYSTERECTOMY    . APPENDECTOMY    . Bilateral knee replacements      2007, 2008  . BLADDER SURGERY    . CARDIOVERSION N/A 02/04/2017   Procedure: CARDIOVERSION;  Surgeon: Arnoldo Lenis, MD;  [facility];  Service: Endoscopy;  Laterality: N/A;  . CARDIOVERSION N/A 02/26/2017   Procedure: CARDIOVERSION;  Surgeon: Satira Sark, MD;  [facility];  Service: Cardiovascular;  Laterality: N/A;  . CARDIOVERSION N/A 08/07/2017   Procedure: CARDIOVERSION;  Surgeon: Pixie Casino, MD;  Location: [facility];  Service: Cardiovascular;  Laterality: N/A;  . COLONOSCOPY N/A 06/15/2015   Procedure: COLONOSCOPY;  Surgeon: Rogene Houston, MD;  [facility];  Service: Endoscopy;  Laterality: N/A;  210  . CYSTOSCOPY W/ RETROGRADES Bilateral 05/14/2016   Procedure: CYSTOSCOPY WITH BILATERAL RETROGRADE PYELOGRAM;  Surgeon: Raynelle Bring, MD;  [facility];  Service: Urology;  Laterality: Bilateral;  GENERAL ANESTHESIA WITH PARALYSIS  . ESOPHAGOGASTRODUODENOSCOPY (EGD) WITH PROPOFOL N/A 06/14/2017   Procedure: ESOPHAGOGASTRODUODENOSCOPY (EGD) WITH PROPOFOL;  Surgeon: Rogene Houston, MD;  [facility];  Service: Endoscopy;  Laterality: N/A;  . GIVENS CAPSULE STUDY  06/14/2017   Procedure: GIVENS CAPSULE STUDY;  Surgeon: Rogene Houston, MD;  [facility];  Service: Endoscopy;;  . LEFT HEART CATHETERIZATION WITH CORONARY ANGIOGRAM N/A 10/30/2013   Procedure: LEFT HEART CATHETERIZATION WITH CORONARY ANGIOGRAM;  Surgeon: Burnell Blanks, MD;  Location: [facility] CATH LAB;  Service: Cardiovascular;  Laterality: N/A;  . RIGHT/LEFT HEART CATH AND CORONARY ANGIOGRAPHY N/A 05/03/2017   Procedure: RIGHT/LEFT HEART CATH AND CORONARY ANGIOGRAPHY;  Surgeon: Leonie Man, MD;  Location: [facility] CV LAB;  Service: Cardiovascular;  Laterality: N/A;  . TEE WITHOUT CARDIOVERSION N/A 02/04/2017   Procedure: TRANSESOPHAGEAL  ECHOCARDIOGRAM (TEE) WITH PROPOL;  Surgeon: Arnoldo Lenis, MD;  [facility];  Service: Endoscopy;  Laterality: N/A;  . TONSILLECTOMY    . TOTAL KNEE ARTHROPLASTY    . TRANSURETHRAL RESECTION OF BLADDER TUMOR N/A 05/14/2016   Procedure: TRANSURETHRAL RESECTION OF BLADDER TUMOR (TURBT);  Surgeon: Raynelle Bring, MD;  [facility];  Service: Urology;  Laterality: N/A;   GENERAL ANESTHESIA WITH PARALYSIS  . YAG LASER APPLICATION Bilateral 66/04/4032   Procedure: YAG LASER APPLICATION;  Surgeon: Williams Che, MD;  [facility];  Service: Ophthalmology;  Laterality: Bilateral;    Current Outpatient Medications  Medication Sig Dispense Refill  . acetaminophen (TYLENOL) 500 MG tablet Take 1,000 mg by mouth every 6 (six) hours as needed for mild pain.     Marland Kitchen atorvastatin (LIPITOR) 20 MG tablet Take 1 tablet (20 mg total) by mouth at bedtime. 90 tablet 3  . balsalazide (COLAZAL) 750 MG capsule TAKE THREE CAPSULES BY MOUTH THREE TIMES DAILY 270 capsule 3  . Cholecalciferol (VITAMIN D-3) 125 MCG (5000 UT) TABS Take 1,000 Units by mouth.    . Cranberry 1000 MG CAPS Take 4,200 mg by mouth daily.    Marland Kitchen dofetilide (TIKOSYN) 250 MCG capsule Take 1 capsule (250 mcg total) by mouth 2 (two) times daily. 60 capsule 11  . furosemide (LASIX) 20 MG tablet TAKE TWO TABLETS BY MOUTH TWICE DAILY 360 tablet 1  . Multiple Vitamins-Minerals (ICAPS AREDS 2 PO) Take 2 tablets by mouth daily at 12 noon.     . potassium chloride SA (K-DUR) 20 MEQ tablet TAKE 1 TABLET BY MOUTH THREE TIMES DAILY 90 tablet 6  . sacubitril-valsartan (ENTRESTO) 24-26 MG Take 1 tablet by mouth 2 (two) times daily. Stop lisinopril-start entresto 48 hours after stopping lisinopril 60 tablet 6  . spironolactone (ALDACTONE) 25 MG tablet Take 0.5 tablets (12.5 mg total) by mouth daily for 90 doses. 45 tablet 3  . XARELTO 20 MG TABS tablet TAKE 1 TABLET BY MOUTH DAILY WITH SUPPER 30 tablet 6   No current facility-administered medications for this visit.   Allergies:  Sinus & allergy [chlorpheniramine-phenylephrine], Carvedilol, Demerol, and Penicillins   Social History: The patient  reports that she has never smoked. She has never used smokeless tobacco. She reports that she does not drink alcohol or use drugs.   ROS:  Please see the history of present illness. Otherwise, complete review of systems is positive  for arthritic pain.  All other systems are reviewed and negative.   Physical Exam: VS:  BP (!) 158/78   Pulse 74   Ht 5' 3"  (1.6 m)   Wt 224 lb (101.6 kg)   SpO2 98%   BMI 39.68 kg/m , BMI Body mass index is 39.68 kg/m.  Wt Readings from Last 3 Encounters:  01/22/19 224 lb (101.6 kg)  12/18/18 225 lb (102.1 kg)  11/28/18 220 lb 6.4 oz (100 kg)    General: A woman, appears comfortable at rest. HEENT: Conjunctiva and lids normal, using a mask. Neck: Supple, no elevated JVP or carotid bruits. Lungs: Clear to auscultation, nonlabored breathing at rest. Cardiac: Regular rate and rhythm, no S3 or significant systolic murmur. Abdomen: Soft, nontender, bowel sounds present. Extremities: Mild lower leg edema, distal pulses 2+. Skin: Warm and dry. Musculoskeletal: No kyphosis. Neuropsychiatric: Alert and oriented x3, affect grossly appropriate.  ECG:  An ECG dated 08/26/2018 was personally reviewed today and demonstrated:  Sinus rhythm with left bundle branch block and prolonged PR  interval.  Recent Labwork: 06/11/2018: Hemoglobin 14.2; Platelets 309 08/26/2018: Magnesium 2.3 11/20/2018: BUN 25; Creat 0.96; Potassium 4.6; Sodium 136   Other Studies Reviewed Today:  Echocardiogram 11/06/2018: 1. Left ventricular ejection fraction, by visual estimation, is 20 to 25%. The left ventricle has severely decreased function. There is mildly increased left ventricular hypertrophy. There is diffuse hypokinesis. 2. Abnormal septal motion consistent with left bundle branch block. 3. Left ventricular diastolic parameters are consistent with Grade I diastolic dysfunction (impaired relaxation). 4. Global right ventricle has normal systolic function.The right ventricular size is normal. No increase in right ventricular wall thickness. 5. Left atrial size was normal. 6. Right atrial size was normal. 7. Mild to moderate mitral annular calcification. 8. Moderate aortic valve annular  calcification. 9. The mitral valve is grossly normal. Mild mitral valve regurgitation. 10. The tricuspid valve is grossly normal. Tricuspid valve regurgitation is trivial. 11. The aortic valve is tricuspid. Aortic valve regurgitation is not visualized. 12. The pulmonic valve was grossly normal. Pulmonic valve regurgitation is mild. 13. The inferior vena cava is normal in size with greater than 50% respiratory variability, suggesting right atrial pressure of 3 mmHg. 14. TR signal is inadequate for assessing pulmonary artery systolic pressure.  Assessment and Plan:  1.  Nonischemic cardiomyopathy with LVEF 20 to 25%, IVCD of left bundle branch type with QRS duration 156 ms.  She has elected to defer CRT device at this time, will follow up with Dr. Curt Bears in April.  We will plan an echocardiogram in March for reassessment prior to that visit.  Continue Entresto, Aldactone, and Lasix with potassium supplements.  2.  Chronic systolic heart failure.  Patient reports improvement in symptoms over the last week or so.  Weight is stable.  No change in present diuretic course.  3.  Persistent atrial fibrillation.  Continue Tikosyn and Xarelto.  I reviewed her lab work from November.  4.  CAD status post BMS to the RCA in 2014.  No active angina symptoms at this time.  She is not on aspirin given use of Xarelto.  Continue statin therapy and observation.  Medication Adjustments/Labs and Tests Ordered: Current medicines are reviewed at length with the patient today.  Concerns regarding medicines are outlined above.   Tests Ordered: Orders Placed This Encounter  Procedures  . ECHOCARDIOGRAM COMPLETE    Medication Changes: No orders of the defined types were placed in this encounter.   Disposition:  Follow up [facility] office visit in April.  Signed, Satira Sark, MD, [facility] 01/22/2019 1:38 PM    [facility] [place], [place], [place] [address] Phone: [phone]; Fax: [phone]

## 2019-01-28 ENCOUNTER — Encounter (HOSPITAL_COMMUNITY): Admission: RE | Payer: Self-pay | Source: Home / Self Care

## 2019-01-28 ENCOUNTER — Ambulatory Visit (HOSPITAL_COMMUNITY): Admission: RE | Admit: 2019-01-28 | Payer: Medicare Other | Source: Home / Self Care | Admitting: Cardiology

## 2019-01-28 SURGERY — BIV ICD INSERTION CRT-D

## 2019-02-06 DIAGNOSIS — I10 Essential (primary) hypertension: Secondary | ICD-10-CM | POA: Diagnosis not present

## 2019-02-06 DIAGNOSIS — I4819 Other persistent atrial fibrillation: Secondary | ICD-10-CM | POA: Diagnosis not present

## 2019-02-15 ENCOUNTER — Other Ambulatory Visit: Payer: Self-pay | Admitting: Cardiology

## 2019-03-06 DIAGNOSIS — I10 Essential (primary) hypertension: Secondary | ICD-10-CM | POA: Diagnosis not present

## 2019-03-06 DIAGNOSIS — E7849 Other hyperlipidemia: Secondary | ICD-10-CM | POA: Diagnosis not present

## 2019-03-06 DIAGNOSIS — I4819 Other persistent atrial fibrillation: Secondary | ICD-10-CM | POA: Diagnosis not present

## 2019-03-25 ENCOUNTER — Other Ambulatory Visit: Payer: Self-pay

## 2019-03-25 ENCOUNTER — Ambulatory Visit (INDEPENDENT_AMBULATORY_CARE_PROVIDER_SITE_OTHER): Payer: Medicare Other

## 2019-03-25 DIAGNOSIS — I428 Other cardiomyopathies: Secondary | ICD-10-CM

## 2019-03-31 ENCOUNTER — Telehealth: Payer: Self-pay | Admitting: *Deleted

## 2019-03-31 NOTE — Telephone Encounter (Signed)
-----   Message from Satira Sark, MD sent at 03/26/2019  9:47 AM EDT ----- Results reviewed.  Actually, LVEF has improved somewhat on medical therapy, now 30 to 35% range.  Still not out of the range to consider CRT which has been discussed with Dr. Curt Bears (if she chooses to pursue this).  Would continue with medical therapy and keep EP follow-up to finalize plan.

## 2019-03-31 NOTE — Telephone Encounter (Signed)
Patient informed. Copy sent to PCP °

## 2019-04-14 ENCOUNTER — Other Ambulatory Visit: Payer: Self-pay

## 2019-04-14 ENCOUNTER — Encounter: Payer: Self-pay | Admitting: Cardiology

## 2019-04-14 ENCOUNTER — Ambulatory Visit: Payer: Medicare Other | Admitting: Cardiology

## 2019-04-14 VITALS — BP 160/80 | HR 68 | Ht 63.0 in | Wt 229.0 lb

## 2019-04-14 DIAGNOSIS — I428 Other cardiomyopathies: Secondary | ICD-10-CM

## 2019-04-14 NOTE — Progress Notes (Signed)
Electrophysiology Office Note   Date:  04/14/2019   ID:  [name], [name] 1930-10-05, MRN [id number]  PCP:  Rory Percy, MD  Cardiologist:  Domenic Polite Primary Electrophysiologist:  Jaedyn Marrufo Meredith Leeds, MD    No chief complaint on file.    History of Present Illness: [name] is a 86 y.o. female who is being seen today for the evaluation of atrial fibrillation at the request of Rory Percy, MD. Presenting today for electrophysiology evaluation.  She has a history of atrial flutter, coronary artery disease status post RCA stent in 2014, paroxysmal atrial fibrillation, and nonischemic cardiomyopathy.  She does have atrial fibrillation and is failed cardioversion and amiodarone.  Most recent echo showed an EF of 25% with catheterization showing a likely nonischemic cardiomyopathy.  She has weakness in class III dyspnea.  Today, denies symptoms of palpitations, chest pain, orthopnea, PND, lower extremity edema, claudication, dizziness, presyncope, syncope, bleeding, or neurologic sequela. The patient is tolerating medications without difficulties.  Overall she is doing well.  She has no chest pain  and is noted no episodes of atrial fibrillation.  She does have persistent shortness of breath, weakness and fatigue that she attributes to her low heart function.  She at this point is ready for CRT implant.  Past Medical History:  Diagnosis Date  . Arthritis   . Atrial flutter ([place])   . Bladder cancer ([place])   . Coronary atherosclerosis of native coronary artery    a. BMS RCA May 2014 - [place] stent. b. Cath 04/2017 - patent stent, minimal CAD otherwise.  . Depression   . Essential hypertension   . GI bleeding 06/2017   a. melena/small bowel ulcer by capsule endo 06/2017.  Marland Kitchen Hyperlipemia   . Macular degeneration   . NICM (nonischemic cardiomyopathy) ([place])   . Persistent atrial fibrillation ([place])   . Ulcerative colitis    Past Surgical History:  Procedure Laterality Date    . ABDOMINAL HYSTERECTOMY    . APPENDECTOMY    . Bilateral knee replacements      2007, 2008  . BLADDER SURGERY    . CARDIOVERSION N/A 02/04/2017   Procedure: CARDIOVERSION;  Surgeon: Arnoldo Lenis, MD;  [facility];  Service: Endoscopy;  Laterality: N/A;  . CARDIOVERSION N/A 02/26/2017   Procedure: CARDIOVERSION;  Surgeon: Satira Sark, MD;  [facility];  Service: Cardiovascular;  Laterality: N/A;  . CARDIOVERSION N/A 08/07/2017   Procedure: CARDIOVERSION;  Surgeon: Pixie Casino, MD;  Location: [facility];  Service: Cardiovascular;  Laterality: N/A;  . COLONOSCOPY N/A 06/15/2015   Procedure: COLONOSCOPY;  Surgeon: Rogene Houston, MD;  [facility];  Service: Endoscopy;  Laterality: N/A;  210  . CYSTOSCOPY W/ RETROGRADES Bilateral 05/14/2016   Procedure: CYSTOSCOPY WITH BILATERAL RETROGRADE PYELOGRAM;  Surgeon: Raynelle Bring, MD;  [facility];  Service: Urology;  Laterality: Bilateral;  GENERAL ANESTHESIA WITH PARALYSIS  . ESOPHAGOGASTRODUODENOSCOPY (EGD) WITH PROPOFOL N/A 06/14/2017   Procedure: ESOPHAGOGASTRODUODENOSCOPY (EGD) WITH PROPOFOL;  Surgeon: Rogene Houston, MD;  [facility];  Service: Endoscopy;  Laterality: N/A;  . GIVENS CAPSULE STUDY  06/14/2017   Procedure: GIVENS CAPSULE STUDY;  Surgeon: Rogene Houston, MD;  [facility];  Service: Endoscopy;;  . LEFT HEART CATHETERIZATION WITH CORONARY ANGIOGRAM N/A 10/30/2013   Procedure: LEFT HEART CATHETERIZATION WITH CORONARY ANGIOGRAM;  Surgeon: Burnell Blanks, MD;  Location: [facility] CATH LAB;  Service: Cardiovascular;  Laterality: N/A;  . RIGHT/LEFT  HEART CATH AND CORONARY ANGIOGRAPHY N/A 05/03/2017   Procedure: RIGHT/LEFT HEART CATH AND CORONARY ANGIOGRAPHY;  Surgeon: Leonie Man, MD;  Location: [facility] CV LAB;  Service: Cardiovascular;  Laterality: N/A;  . TEE WITHOUT CARDIOVERSION N/A 02/04/2017   Procedure: TRANSESOPHAGEAL ECHOCARDIOGRAM (TEE) WITH  PROPOL;  Surgeon: Arnoldo Lenis, MD;  [facility];  Service: Endoscopy;  Laterality: N/A;  . TONSILLECTOMY    . TOTAL KNEE ARTHROPLASTY    . TRANSURETHRAL RESECTION OF BLADDER TUMOR N/A 05/14/2016   Procedure: TRANSURETHRAL RESECTION OF BLADDER TUMOR (TURBT);  Surgeon: Raynelle Bring, MD;  [facility];  Service: Urology;  Laterality: N/A;  GENERAL ANESTHESIA WITH PARALYSIS  . YAG LASER APPLICATION Bilateral 93/02/3555   Procedure: YAG LASER APPLICATION;  Surgeon: Williams Che, MD;  [facility];  Service: Ophthalmology;  Laterality: Bilateral;     Current Outpatient Medications  Medication Sig Dispense Refill  . acetaminophen (TYLENOL) 500 MG tablet Take 1,000 mg by mouth every 6 (six) hours as needed for mild pain.     Marland Kitchen atorvastatin (LIPITOR) 20 MG tablet Take 1 tablet (20 mg total) by mouth at bedtime. 90 tablet 3  . balsalazide (COLAZAL) 750 MG capsule TAKE THREE CAPSULES BY MOUTH THREE TIMES DAILY 270 capsule 3  . Cholecalciferol (VITAMIN D-3) 125 MCG (5000 UT) TABS Take 1,000 Units by mouth.    . Cranberry 1000 MG CAPS Take 4,200 mg by mouth daily.    Marland Kitchen dofetilide (TIKOSYN) 250 MCG capsule Take 1 capsule (250 mcg total) by mouth 2 (two) times daily. 60 capsule 11  . furosemide (LASIX) 20 MG tablet TAKE TWO TABLETS BY MOUTH TWICE DAILY 360 tablet 1  . Multiple Vitamins-Minerals (ICAPS AREDS 2 PO) Take 2 tablets by mouth daily at 12 noon.     . potassium chloride SA (K-DUR) 20 MEQ tablet TAKE 1 TABLET BY MOUTH THREE TIMES DAILY 90 tablet 6  . sacubitril-valsartan (ENTRESTO) 24-26 MG Take 1 tablet by mouth 2 (two) times daily. Stop lisinopril-start entresto 48 hours after stopping lisinopril 60 tablet 6  . XARELTO 20 MG TABS tablet TAKE 1 TABLET BY MOUTH DAILY WITH SUPPER 30 tablet 6  . spironolactone (ALDACTONE) 25 MG tablet Take 0.5 tablets (12.5 mg total) by mouth daily for 90 doses. 45 tablet 3   No current facility-administered medications for this visit.      Allergies:   Sinus & allergy [chlorpheniramine-phenylephrine], Carvedilol, Demerol, and Penicillins   Social History:  The patient  reports that she has never smoked. She has never used smokeless tobacco. She reports that she does not drink alcohol or use drugs.   Family History:  The patient's family history includes CAD in her father.   ROS:  Please see the history of present illness.   Otherwise, review of systems is positive for none.   All other systems are reviewed and negative.   PHYSICAL EXAM: VS:  BP (!) 160/80   Pulse 68   Ht 5' 3"  (1.6 m)   Wt 229 lb (103.9 kg)   SpO2 98%   BMI 40.57 kg/m  , BMI Body mass index is 40.57 kg/m. GEN: Well nourished, well developed, in no acute distress  HEENT: normal  Neck: no JVD, carotid bruits, or masses Cardiac: RRR; no murmurs, rubs, or gallops,no edema  Respiratory:  clear to auscultation bilaterally, normal work of breathing GI: soft, nontender, nondistended, + BS MS: no deformity or atrophy  Skin: warm and dry Neuro:  Strength and  sensation are intact Psych: euthymic mood, full affect  EKG:  EKG is ordered today. Personal review of the ekg ordered shows sinus rhythm, atypical left bundle branch block  Recent Labs: 06/11/2018: Hemoglobin 14.2; Platelets 309 08/26/2018: Magnesium 2.3 11/20/2018: BUN 25; Creat 0.96; Potassium 4.6; Sodium 136    Lipid Panel  No results found for: CHOL, TRIG, HDL, CHOLHDL, VLDL, LDLCALC, LDLDIRECT   Wt Readings from Last 3 Encounters:  04/14/19 229 lb (103.9 kg)  01/22/19 224 lb (101.6 kg)  12/18/18 225 lb (102.1 kg)      Other studies Reviewed: Additional studies/ records that were reviewed today include: TTE 11/06/18 Review of the above records today demonstrates:   1. Left ventricular ejection fraction, by visual estimation, is 20 to 25%. The left ventricle has severely decreased function. There is mildly increased left ventricular hypertrophy. There is diffuse hypokinesis.  2.  Abnormal septal motion consistent with left bundle branch block.  3. Left ventricular diastolic parameters are consistent with Grade I diastolic dysfunction (impaired relaxation).  4. Global right ventricle has normal systolic function.The right ventricular size is normal. No increase in right ventricular wall thickness.  5. Left atrial size was normal.  6. Right atrial size was normal.  7. Mild to moderate mitral annular calcification.  8. Moderate aortic valve annular calcification.  9. The mitral valve is grossly normal. Mild mitral valve regurgitation. 10. The tricuspid valve is grossly normal. Tricuspid valve regurgitation is trivial. 11. The aortic valve is tricuspid. Aortic valve regurgitation is not visualized. 12. The pulmonic valve was grossly normal. Pulmonic valve regurgitation is mild. 13. The inferior vena cava is normal in size with greater than 50% respiratory variability, suggesting right atrial pressure of 3 mmHg. 14. TR signal is inadequate for assessing pulmonary artery systolic pressure.  [facility] 05/03/17  Hemodynamic findings consistent with mild secondary pulmonary hypertension.  Patient has severe nonischemic cardiomyopathy  Moderate to Severely reduced CO/CI  LV end diastolic pressure is moderately elevated.  Mid RCA stent widely patent  There is mild (2+) mitral regurgitation.  [place] 09/16/18 personally reviewed Max 197 bpm 09:12am, 08/24 Min 38 bpm 03:09am, 08/24 Avg 68 bpm Rare PVCs Frequent PACs Atrial fibrillation: None Multiple runs of SVT, longest 11 seconds, fastest rate 197 for 8 beats  ASSESSMENT AND PLAN:  1.  Persistent atrial fibrillation: Currently on Xarelto and dofetilide.  CHA2DS2-VASc of 4.  Remains in sinus rhythm.    2.  Nonischemic cardiomyopathy: Ejection fraction remains low at 30 to 35%.  She has a IVCD with a more left bundle branch block morphology.  She has persistent shortness of breath weakness and fatigue.  At this point, she  would likely benefit from CRT-P implant.  Risks and benefits were discussed include bleeding, tamponade, infection, pneumothorax.  She understand these risks and has agreed to the procedure.  Her blood pressure is elevated today, though she attributes this to whitecoat hypertension.  Her blood pressure is in the 338-250 systolic at home.  3.  Coronary artery disease: Status post RCA stent in 2014.  No further chest pain.  Case discussed with general cardiology  Current medicines are reviewed at length with the patient today.   The patient does not have concerns regarding her medicines.  The following changes were made today: None  Labs/ tests ordered today include:  Orders Placed This Encounter  Procedures  . EKG 12-Lead    Disposition:   FU with Chihiro Frey 3 months  Signed, Susanna Benge Meredith Leeds, MD  04/14/2019 1:37 PM     [address] [place] [address] [phone] (office) [phone] (fax)

## 2019-04-14 NOTE — Patient Instructions (Addendum)
Medication Instructions:  Your physician recommends that you continue on your current medications as directed. Please refer to the Current Medication list given to you today.  *If you need a refill on your cardiac medications before your next appointment, please call your pharmacy*   Lab Work: None ordered If you have labs (blood work) drawn today and your tests are completely normal, you will receive your results only by: Marland Kitchen MyChart Message (if you have MyChart) OR . A paper copy in the mail If you have any lab test that is abnormal or we need to change your treatment, we will call you to review the results.   Testing/Procedures: None ordered   Follow-Up: Your physician recommends that you schedule a follow-up appointment in: 10-14 days, after your procedure on 07/08/2019  At [facility], you and your health needs are our priority.  As part of our continuing mission to provide you with exceptional heart care, we have created designated Provider Care Teams.  These Care Teams include your primary Cardiologist (physician) and Advanced Practice Providers (APPs -  Physician Assistants and Nurse Practitioners) who all work together to provide you with the care you need, when you need it.  We recommend signing up for the patient portal called "MyChart".  Sign up information is provided on this After Visit Summary.  MyChart is used to connect with patients for Virtual Visits (Telemedicine).  Patients are able to view lab/test results, encounter notes, upcoming appointments, etc.  Non-urgent messages can be sent to your provider as well.   To learn more about what you can do with MyChart, go to [email or web address].    Your next appointment:   3 month(s), after your ablation,  The format for your next appointment:   In Person  Provider:   Allegra Lai, MD   Thank you for choosing [facility]!!   Trinidad Curet, RN [phone]    Other Instructions   Implantable Device  Instructions  You are scheduled for: biventricular implantable cardiac defibrillator on _________ with Dr. Curt Bears.  1.   Pre procedure testing-             A.  LAB WORK--- On ___________  You do not need to be fasting.              B. COVID TEST-- On ___________ -  Stay in your car and the nurse team will come to your car to test you.  After you are tested please go home and self quarantine until the day of your procedure.    2. On the day of your procedure __________ you will go to [facility] hospital [phone]) at _____________.  You will go to the main entrance [address] [employer]) and enter where the [employer] parking staff are.  You will check in at ADMITTING.  You may have one support person come in to the hospital with you.  They will be asked to wait in the waiting room.   3.   Do not eat or drink after midnight prior to your procedure.   4.   On the morning of your procedure do NOT take any medication.  5.  The night before your procedure and the morning of your procedure scrub your neck/chest with surgical scrub.  An instruction letter is included with this letter.     5.  Plan for an overnight stay.  If you use your phone frequently bring your phone charger.  When you are discharged you will  need someone to drive you home.   6.  You will follow up with the [place] clinic 10-14 days after your procedure. You will follow up with Dr. Curt Bears 91 days after your procedure.  These appointments will be made for you.   * If you have ANY questions after you get home, please call the office [phone] and ask for Chapin Arduini RN or send a MyChart message.   [place] - Preparing For Surgery  Before surgery, you can play an important role. Because skin is not sterile, your skin needs to be as free of germs as possible. You can reduce the number of germs on your skin by washing with CHG (chlorahexidine gluconate) Soap before surgery.  CHG is an antiseptic cleaner which kills  germs and bonds with the skin to continue killing germs even after washing.   Please do not use if you have an allergy to CHG or antibacterial soaps.  If your skin becomes reddened/irritated stop using the CHG.   Do not shave (including legs and underarms) for at least 48 hours prior to first CHG shower.  It is OK to shave your face.  Please follow these instructions carefully:  1.  Shower the night before surgery and the morning of surgery with CHG.  2.  If you choose to wash your hair, wash your hair first as usual with your normal shampoo.  3.  After you shampoo, rinse your hair and body thoroughly to remove the shampoo.  4.  Use CHG as you would any other liquid soap.  You can apply CHG directly to the skin and wash gently with a clean washcloth. 5.  Apply the CHG Soap to your body ONLY FROM THE NECK DOWN.  Do not use on open wounds or open sores.  Avoid contact with your eyes, ears, mouth and genitals (private parts).  Wash genitals (private parts) with your normal soap.  6.  Wash thoroughly, paying special attention to the area where your surgery will be performed.  7.  Thoroughly rinse your body with warm water from the neck down.   8.  DO NOT shower/wash with your normal soap after using and rinsing off the CHG soap.  9.  Pat yourself dry with a clean towel.           10.  Wear clean pajamas.           11.  Place clean sheets on your bed the night of your first shower and do not sleep with pets.  Day of Surgery: Do not apply any deodorants/lotions.  Please wear clean clothes to the hospital/surgery center.   Cardioverter Defibrillator Implantation  An implantable cardioverter defibrillator (ICD) is a small device that is placed under the skin in the chest or abdomen. An ICD consists of a battery, a small computer (pulse generator), and wires (leads) that go into the heart. An ICD is used to detect and correct two types of dangerous irregular heartbeats (arrhythmias):  A rapid heart  rhythm (tachycardia).  An arrhythmia in which the lower chambers of the heart (ventricles) contract in an uncoordinated way (fibrillation). When an ICD detects tachycardia, it sends a low-energy shock to the heart to restore the heartbeat to normal (cardioversion). This signal is usually painless. If cardioversion does not work or if the ICD detects fibrillation, it delivers a high-energy shock to the heart (defibrillation) to restart the heart. This shock may feel like a strong jolt in the chest. Your health  care provider may prescribe an ICD if:  You have had an arrhythmia that originated in the ventricles.  Your heart has been damaged by a disease or heart condition. Sometimes, ICDs are programmed to act as a device called a pacemaker. Pacemakers can be used to treat a slow heartbeat (bradycardia) or tachycardia by taking over the heart rate with electrical impulses. Tell a health care provider about:  Any allergies you have.  All medicines you are taking, including vitamins, herbs, eye drops, creams, and over-the-counter medicines.  Any problems you or family members have had with anesthetic medicines.  Any blood disorders you have.  Any surgeries you have had.  Any medical conditions you have.  Whether you are pregnant or may be pregnant. What are the risks? Generally, this is a safe procedure. However, problems may occur, including:  Swelling, bleeding, or bruising.  Infection.  Blood clots.  Damage to other structures or organs, such as nerves, blood vessels, or the heart.  Allergic reactions to medicines used during the procedure. What happens before the procedure? Staying hydrated Follow instructions from your health care provider about hydration, which may include:  Up to 2 hours before the procedure - you may continue to drink clear liquids, such as water, clear fruit juice, black coffee, and plain tea. Eating and drinking restrictions Follow instructions from  your health care provider about eating and drinking, which may include:  8 hours before the procedure - stop eating heavy meals or foods such as meat, fried foods, or fatty foods.  6 hours before the procedure - stop eating light meals or foods, such as toast or cereal.  6 hours before the procedure - stop drinking milk or drinks that contain milk.  2 hours before the procedure - stop drinking clear liquids. Medicine Ask your health care provider about:  Changing or stopping your normal medicines. This is important if you take diabetes medicines or blood thinners.  Taking medicines such as aspirin and ibuprofen. These medicines can thin your blood. Do not take these medicines before your procedure if your doctor tells you not to. Tests  You may have blood tests.  You may have a test to check the electrical signals in your heart (electrocardiogram, ECG).  You may have imaging tests, such as a chest X-ray. General instructions  For 24 hours before the procedure, stop using products that contain nicotine or tobacco, such as cigarettes and e-cigarettes. If you need help quitting, ask your health care provider.  Plan to have someone take you home from the hospital or clinic.  You may be asked to shower with a germ-killing soap. What happens during the procedure?  To reduce your risk of infection: ? Your health care team will wash or sanitize their hands. ? Your skin will be washed with soap. ? Hair may be removed from the surgical area.  Small monitors will be put on your body. They will be used to check your heart, blood pressure, and oxygen level.  An IV tube will be inserted into one of your veins.  You will be given one or more of the following: ? A medicine to help you relax (sedative). ? A medicine to numb the area (local anesthetic). ? A medicine to make you fall asleep (general anesthetic).  Leads will be guided through a blood vessel into your heart and attached to  your heart muscles. Depending on the ICD, the leads may go into one ventricle or they may go into both ventricles  and into an upper chamber of the heart. An X-ray machine (fluoroscope) will be usedto help guide the leads.  A small incision will be made to create a deep pocket under your skin.  The pulse generator will be placed into the pocket.  The ICD will be tested.  The incision will be closed with stitches (sutures), skin glue, or staples.  A bandage (dressing) will be placed over the incision. This procedure may vary among health care providers and hospitals. What happens after the procedure?  Your blood pressure, heart rate, breathing rate, and blood oxygen level will be monitored often until the medicines you were given have worn off.  A chest X-ray will be taken to check that the ICD is in the right place.  You will need to stay in the hospital for 1-2 days so your health care provider can make sure your ICD is working.  Do not drive for 24 hours if you received a sedative. Ask your health care provider when it is safe for you to drive.  You may be given an identification card explaining that you have an ICD. Summary  An implantable cardioverter defibrillator (ICD) is a small device that is placed under the skin in the chest or abdomen. It is used to detect and correct dangerous irregular heartbeats (arrhythmias).  An ICD consists of a battery, a small computer (pulse generator), and wires (leads) that go into the heart.  When an ICD detects rapid heart rhythm (tachycardia), it sends a low-energy shock to the heart to restore the heartbeat to normal (cardioversion). If cardioversion does not work or if the ICD detects uncoordinated heart contractions (fibrillation), it delivers a high-energy shock to the heart (defibrillation) to restart the heart.  You will need to stay in the hospital for 1-2 days to make sure your ICD is working. This information is not intended to replace  advice given to you by your health care provider. Make sure you discuss any questions you have with your health care provider. Document Revised: 12/07/2016 Document Reviewed: 01/04/2016 Elsevier Patient Education  2020 [employer].

## 2019-04-16 ENCOUNTER — Other Ambulatory Visit: Payer: Self-pay | Admitting: Cardiology

## 2019-04-16 ENCOUNTER — Other Ambulatory Visit: Payer: Self-pay | Admitting: *Deleted

## 2019-04-16 MED ORDER — ATORVASTATIN CALCIUM 20 MG PO TABS: 20.0000 mg | ORAL_TABLET | Freq: Every day | ORAL | 3 refills | Status: DC

## 2019-04-27 DIAGNOSIS — K1120 Sialoadenitis, unspecified: Secondary | ICD-10-CM | POA: Diagnosis not present

## 2019-05-01 ENCOUNTER — Ambulatory Visit: Payer: Medicare Other | Admitting: Cardiology

## 2019-05-02 DIAGNOSIS — S6000XA Contusion of unspecified finger without damage to nail, initial encounter: Secondary | ICD-10-CM | POA: Diagnosis not present

## 2019-05-02 DIAGNOSIS — Z6839 Body mass index (BMI) 39.0-39.9, adult: Secondary | ICD-10-CM | POA: Diagnosis not present

## 2019-05-08 ENCOUNTER — Other Ambulatory Visit: Payer: Self-pay

## 2019-05-08 ENCOUNTER — Encounter: Payer: Self-pay | Admitting: Cardiology

## 2019-05-08 ENCOUNTER — Ambulatory Visit: Payer: Medicare Other | Admitting: Cardiology

## 2019-05-08 VITALS — BP 124/72 | HR 50 | Temp 98.4°F | Ht 63.0 in | Wt 229.0 lb

## 2019-05-08 DIAGNOSIS — I429 Cardiomyopathy, unspecified: Secondary | ICD-10-CM | POA: Diagnosis not present

## 2019-05-08 DIAGNOSIS — I4819 Other persistent atrial fibrillation: Secondary | ICD-10-CM | POA: Diagnosis not present

## 2019-05-08 DIAGNOSIS — I428 Other cardiomyopathies: Secondary | ICD-10-CM | POA: Diagnosis not present

## 2019-05-08 DIAGNOSIS — I25119 Atherosclerotic heart disease of native coronary artery with unspecified angina pectoris: Secondary | ICD-10-CM | POA: Diagnosis not present

## 2019-05-08 NOTE — Patient Instructions (Signed)
Medication Instructions: Your physician recommends that you continue on your current medications as directed. Please refer to the Current Medication list given to you today.   Labwork: None today  Procedures/Testing: None today  Follow-Up: 3 months office visit in [place] with Dr.McDowell  Any Additional Special Instructions Will Be Listed Below (If Applicable).     If you need a refill on your cardiac medications before your next appointment, please call your pharmacy.      Thank you for choosing [facility] !

## 2019-05-08 NOTE — Progress Notes (Signed)
Cardiology Office Note  Date: 05/08/2019   ID: [place], [place]an [birth year], MRN [id number]  PCP:  Rory Percy, MD  Cardiologist:  Rozann Lesches, MD Electrophysiologist:  Constance Haw, MD   Chief Complaint  Patient presents with  . Cardiac follow-up    History of Present Illness: [name] is an 86 y.o. female last seen in January.  She presents for a routine visit.  She reports NYHA class II-III dyspnea, no palpitations or syncope, no obvious angina symptoms.  She saw Dr. Curt Bears in early April for discussion of CRT-P in the setting of LVEF 30 to 35% and IVCD.  Follow-up echocardiogram and ECG from March are noted below.  She has decided that she would like to pursue this option, but has not yet scheduled it.  I reviewed her medications which are outlined below.  Current cardiac regimen includes Xarelto, Aldactone, Entresto, Tikosyn, and Lipitor.  She is not on beta-blocker with slow heart rate and left bundle branch block.  Past Medical History:  Diagnosis Date  . Arthritis   . Atrial flutter ([place])   . Bladder cancer ([place])   . Coronary atherosclerosis of native coronary artery    a. BMS RCA May 2014 - [place] stent. b. Cath 04/2017 - patent stent, minimal CAD otherwise.  . Depression   . Essential hypertension   . GI bleeding 06/2017   a. melena/small bowel ulcer by capsule endo 06/2017.  Marland Kitchen Hyperlipemia   . Macular degeneration   . NICM (nonischemic cardiomyopathy) ([place])   . Persistent atrial fibrillation ([place])   . Ulcerative colitis     Past Surgical History:  Procedure Laterality Date  . ABDOMINAL HYSTERECTOMY    . APPENDECTOMY    . Bilateral knee replacements      2007, 2008  . BLADDER SURGERY    . CARDIOVERSION N/A 02/04/2017   Procedure: CARDIOVERSION;  Surgeon: Arnoldo Lenis, MD;  [facility];  Service: Endoscopy;  Laterality: N/A;  . CARDIOVERSION N/A 02/26/2017   Procedure: CARDIOVERSION;  Surgeon: Satira Sark, MD;  Location: [facility] OR[facility];  Service: Cardiovascular;  Laterality: N/A;  . CARDIOVERSION N/A 08/07/2017   Procedure: CARDIOVERSION;  Surgeon: Pixie Casino, MD;  Location: [place];  Service: Cardiovascular;  Laterality: N/A;  . COLONOSCOPY N/A 06/15/2015   Procedure: COLONOSCOPY;  Surgeon: Rogene Houston, MD;  [facility];  Service: Endoscopy;  Laterality: N/A;  210  . CYSTOSCOPY W/ RETROGRADES Bilateral 05/14/2016   Procedure: CYSTOSCOPY WITH BILATERAL RETROGRADE PYELOGRAM;  Surgeon: Raynelle Bring, MD;  Location: [facility];  Service: Urology;  Laterality: Bilateral;  GENERAL ANESTHESIA WITH PARALYSIS  . ESOPHAGOGASTRODUODENOSCOPY (EGD) WITH PROPOFOL N/A 06/14/2017   Procedure: ESOPHAGOGASTRODUODENOSCOPY (EGD) WITH PROPOFOL;  Surgeon: Rogene Houston, MD;  [facility];  Service: Endoscopy;  Laterality: N/A;  . GIVENS CAPSULE STUDY  06/14/2017   Procedure: GIVENS CAPSULE STUDY;  Surgeon: Rogene Houston, MD;  [facility];  Service: Endoscopy;;  . LEFT HEART CATHETERIZATION WITH CORONARY ANGIOGRAM N/A 10/30/2013   Procedure: LEFT HEART CATHETERIZATION WITH CORONARY ANGIOGRAM;  Surgeon: Burnell Blanks, MD;  Location: [facility] CATH LAB;  Service: Cardiovascular;  Laterality: N/A;  . RIGHT/LEFT HEART CATH AND CORONARY ANGIOGRAPHY N/A 05/03/2017   Procedure: RIGHT/LEFT HEART CATH AND CORONARY ANGIOGRAPHY;  Surgeon: Leonie Man, MD;  Location: [place] CV LAB;  Service: Cardiovascular;  Laterality: N/A;  . TEE WITHOUT CARDIOVERSION N/A 02/04/2017   Procedure: TRANSESOPHAGEAL ECHOCARDIOGRAM (  TEE) WITH PROPOL;  Surgeon: Arnoldo Lenis, MD;  [facility];  Service: Endoscopy;  Laterality: N/A;  . TONSILLECTOMY    . TOTAL KNEE ARTHROPLASTY    . TRANSURETHRAL RESECTION OF BLADDER TUMOR N/A 05/14/2016   Procedure: TRANSURETHRAL RESECTION OF BLADDER TUMOR (TURBT);  Surgeon: Raynelle Bring, MD;  Location: [facility];  Service: Urology;  Laterality: N/A;   GENERAL ANESTHESIA WITH PARALYSIS  . YAG LASER APPLICATION Bilateral 41/09/3788   Procedure: YAG LASER APPLICATION;  Surgeon: Williams Che, MD;  Location: [facility];  Service: Ophthalmology;  Laterality: Bilateral;    Current Outpatient Medications  Medication Sig Dispense Refill  . acetaminophen (TYLENOL) 500 MG tablet Take 1,000 mg by mouth every 6 (six) hours as needed for mild pain.     Marland Kitchen atorvastatin (LIPITOR) 20 MG tablet Take 1 tablet (20 mg total) by mouth at bedtime. 90 tablet 3  . balsalazide (COLAZAL) 750 MG capsule TAKE THREE CAPSULES BY MOUTH THREE TIMES DAILY 270 capsule 3  . Cholecalciferol (VITAMIN D-3) 125 MCG (5000 UT) TABS Take 1,000 Units by mouth.    . Cranberry 1000 MG CAPS Take 4,200 mg by mouth daily.    Marland Kitchen dofetilide (TIKOSYN) 250 MCG capsule Take 1 capsule (250 mcg total) by mouth 2 (two) times daily. 60 capsule 11  . furosemide (LASIX) 20 MG tablet TAKE TWO TABLETS BY MOUTH TWICE DAILY 360 tablet 1  . Multiple Vitamins-Minerals (ICAPS AREDS 2 PO) Take 2 tablets by mouth daily at 12 noon.     . potassium chloride SA (KLOR-CON) 20 MEQ tablet TAKE 1 TABLET BY MOUTH THREE TIMES DAILY 90 tablet 6  . sacubitril-valsartan (ENTRESTO) 24-26 MG Take 1 tablet by mouth 2 (two) times daily. Stop lisinopril-start entresto 48 hours after stopping lisinopril 60 tablet 6  . spironolactone (ALDACTONE) 25 MG tablet Take 0.5 tablets (12.5 mg total) by mouth daily for 90 doses. 45 tablet 3  . XARELTO 20 MG TABS tablet TAKE 1 TABLET BY MOUTH DAILY WITH SUPPER 30 tablet 6   No current facility-administered medications for this visit.   Allergies:  Sinus & allergy [chlorpheniramine-phenylephrine], Carvedilol, Demerol, and Penicillins   ROS:  No syncope.  Physical Exam: VS:  BP 124/72   Pulse (!) 50   Temp 98.4 F (36.9 C)   Ht 5' 3"  (1.6 m)   Wt 229 lb (103.9 kg)   SpO2 95%   BMI 40.57 kg/m , BMI Body mass index is 40.57 kg/m.  Wt Readings from Last 3 Encounters:  05/08/19  229 lb (103.9 kg)  04/14/19 229 lb (103.9 kg)  01/22/19 224 lb (101.6 kg)    General: Elderly woman, appears comfortable at rest. HEENT: Conjunctiva and lids normal, wearing a mask. Neck: Supple, no elevated JVP or carotid bruits, no thyromegaly. Lungs: Clear to auscultation, nonlabored breathing at rest. Cardiac: Regular rate and rhythm, no S3 or significant systolic murmur. Extremities: Mild ankle edema.  ECG:  An ECG dated 04/14/2019 was personally reviewed today and demonstrated:  Sinus rhythm with IVCD of left bundle branch block type and QRS duration 168 ms.  Recent Labwork: 06/11/2018: Hemoglobin 14.2; Platelets 309 08/26/2018: Magnesium 2.3 11/20/2018: BUN 25; Creat 0.96; Potassium 4.6; Sodium 136   Other Studies Reviewed Today:  Echocardiogram 03/25/2019: 1. Endocardium poorly visualized, difficult to assess LV function.  Recommend limited contrast study to better evaluate LVEF, 30-35% is rough  estimate. . Left ventricular ejection fraction, by estimation, is 30-35%.  The left ventricle has moderate to  severely decreased function. The left ventricle demonstrates global  hypokinesis. Left ventricular diastolic parameters are consistent with  Grade I diastolic dysfunction (impaired relaxation).  2. Right ventricular systolic function is normal. The right ventricular  size is normal. There is normal pulmonary artery systolic pressure.  3. Left atrial size was severely dilated.  4. Right atrial size was mildly dilated.  5. The mitral valve is normal in structure. No evidence of mitral valve  regurgitation. No evidence of mitral stenosis.  6. The aortic valve is tricuspid. Aortic valve regurgitation is not  visualized. No aortic stenosis is present.  7. The inferior vena cava is normal in size with greater than 50%  respiratory variability, suggesting right atrial pressure of 3 mmHg.   Assessment and Plan:  1.  Nonischemic cardiomyopathy with LVEF 30 to 35% by  echocardiogram in March, IVCD of left bundle branch block type and QRS duration 168 ms.  She is considering CRT-P with Dr. Curt Bears, plans to call next week to set this up electively.  No changes being made to present regimen which includes Entresto, Aldactone, and Lasix.  No beta-blocker with slow heart rate and IVCD.  2.  Persistent atrial fibrillation, continue Tikosyn and Xarelto.  3.  Symptomatically stable CAD status post BMS to the RCA in 2014.  No angina symptoms at this time.  Continue statin therapy.  Medication Adjustments/Labs and Tests Ordered: Current medicines are reviewed at length with the patient today.  Concerns regarding medicines are outlined above.   Tests Ordered: No orders of the defined types were placed in this encounter.   Medication Changes: No orders of the defined types were placed in this encounter.   Disposition:  Follow up 3 months in the [place] office.  Signed, Satira Sark, MD, [facility] 05/08/2019 4:23 PM    [facility] at [facility] [address] [place], [place] [address] Phone: [phone]; Fax: [phone]

## 2019-05-12 ENCOUNTER — Telehealth: Payer: Self-pay | Admitting: Cardiology

## 2019-05-12 NOTE — Telephone Encounter (Signed)
Patient states she is returning a call to Dr. Macky Lower nurse. Please call.

## 2019-05-14 ENCOUNTER — Other Ambulatory Visit: Payer: Self-pay | Admitting: Cardiology

## 2019-05-14 ENCOUNTER — Other Ambulatory Visit (INDEPENDENT_AMBULATORY_CARE_PROVIDER_SITE_OTHER): Payer: Self-pay | Admitting: Internal Medicine

## 2019-05-15 NOTE — Telephone Encounter (Signed)
Pt would like CRT-P implant scheduled for 6/2. Aware I will arrange and call her back to go over instructions. Patient verbalized understanding and agreeable to plan.

## 2019-05-26 DIAGNOSIS — M545 Low back pain: Secondary | ICD-10-CM | POA: Diagnosis not present

## 2019-05-26 DIAGNOSIS — Z6841 Body Mass Index (BMI) 40.0 and over, adult: Secondary | ICD-10-CM | POA: Diagnosis not present

## 2019-05-27 NOTE — Telephone Encounter (Signed)
Procedure instructions reviewed w/ pt.  Sent via [place], and will pick up copy from [place] office this week. Covid screening scheduled for 6/1. Pt aware office will call to arrange post procedure follow up. Patient verbalized understanding and agreeable to plan.

## 2019-05-28 ENCOUNTER — Telehealth: Payer: Self-pay | Admitting: Cardiology

## 2019-05-28 DIAGNOSIS — K8020 Calculus of gallbladder without cholecystitis without obstruction: Secondary | ICD-10-CM | POA: Diagnosis not present

## 2019-05-28 DIAGNOSIS — R1084 Generalized abdominal pain: Secondary | ICD-10-CM | POA: Diagnosis not present

## 2019-05-28 NOTE — Telephone Encounter (Signed)
Spoke to pt, verbal permission given to speak w/ dtr Joy. Called and spoke w/ Joy. Apologized for the scheduling mistake and that pt is getting a CRT-P, not D. Aware I will correct w/ hospital.  Aware letter of instructions state defibrillator, but should say pacemaker. dtr appreciates my follow up and will inform pt that she is having a BiV PPM, not ICD.

## 2019-05-28 NOTE — Telephone Encounter (Signed)
Follow Up:   Daughter called and said pt communicated with [name] yesterday. She needs to talk to [facility] to make sure pt understood what she said please.

## 2019-06-05 ENCOUNTER — Telehealth: Payer: Self-pay | Admitting: Cardiology

## 2019-06-05 NOTE — Telephone Encounter (Signed)
New message   Patient states that she is having side pain and she states that she had colitis on yesterday. Please call to discuss.

## 2019-06-05 NOTE — Telephone Encounter (Signed)
Spoke with pt who is to have pacemaker placed on 06/10/19. Pt calling as FYI.

## 2019-06-08 ENCOUNTER — Emergency Department (HOSPITAL_COMMUNITY): Payer: Medicare Other

## 2019-06-08 ENCOUNTER — Other Ambulatory Visit: Payer: Self-pay

## 2019-06-08 ENCOUNTER — Emergency Department (HOSPITAL_COMMUNITY)
Admission: EM | Admit: 2019-06-08 | Discharge: 2019-06-08 | Disposition: A | Discharge status: Home / Self Care | Payer: Medicare Other | Attending: Emergency Medicine | Admitting: Emergency Medicine

## 2019-06-08 ENCOUNTER — Encounter (HOSPITAL_COMMUNITY): Payer: Self-pay | Admitting: *Deleted

## 2019-06-08 DIAGNOSIS — Z8551 Personal history of malignant neoplasm of bladder: Secondary | ICD-10-CM | POA: Insufficient documentation

## 2019-06-08 DIAGNOSIS — I5022 Chronic systolic (congestive) heart failure: Secondary | ICD-10-CM | POA: Diagnosis not present

## 2019-06-08 DIAGNOSIS — I110 Hypertensive heart disease with heart failure: Secondary | ICD-10-CM | POA: Insufficient documentation

## 2019-06-08 DIAGNOSIS — I5042 Chronic combined systolic (congestive) and diastolic (congestive) heart failure: Secondary | ICD-10-CM | POA: Diagnosis not present

## 2019-06-08 DIAGNOSIS — I2510 Atherosclerotic heart disease of native coronary artery without angina pectoris: Secondary | ICD-10-CM | POA: Diagnosis not present

## 2019-06-08 DIAGNOSIS — I428 Other cardiomyopathies: Secondary | ICD-10-CM | POA: Diagnosis not present

## 2019-06-08 DIAGNOSIS — R1084 Generalized abdominal pain: Secondary | ICD-10-CM

## 2019-06-08 DIAGNOSIS — Z96659 Presence of unspecified artificial knee joint: Secondary | ICD-10-CM | POA: Insufficient documentation

## 2019-06-08 DIAGNOSIS — Z79899 Other long term (current) drug therapy: Secondary | ICD-10-CM | POA: Insufficient documentation

## 2019-06-08 DIAGNOSIS — K8020 Calculus of gallbladder without cholecystitis without obstruction: Secondary | ICD-10-CM | POA: Diagnosis not present

## 2019-06-08 DIAGNOSIS — I482 Chronic atrial fibrillation, unspecified: Secondary | ICD-10-CM | POA: Diagnosis not present

## 2019-06-08 LAB — COMPREHENSIVE METABOLIC PANEL
ALT: 18 U/L (ref 0–44)
AST: 24 U/L (ref 15–41)
Albumin: 4.3 g/dL (ref 3.5–5.0)
Alkaline Phosphatase: 50 U/L (ref 38–126)
Anion gap: 11 (ref 5–15)
BUN: 15 mg/dL (ref 8–23)
CO2: 25 mmol/L (ref 22–32)
Calcium: 9.7 mg/dL (ref 8.9–10.3)
Chloride: 101 mmol/L (ref 98–111)
Creatinine, Ser: 0.82 mg/dL (ref 0.44–1.00)
GFR calc Af Amer: >60 mL/min (ref >60)
GFR calc non Af Amer: >60 mL/min (ref >60)
Glucose, Bld: 106 mg/dL — ABNORMAL HIGH (ref 70–99)
Potassium: 4.2 mmol/L (ref 3.5–5.1)
Sodium: 137 mmol/L (ref 135–145)
Total Bilirubin: 1.8 mg/dL — ABNORMAL HIGH (ref 0.3–1.2)
Total Protein: 7.7 g/dL (ref 6.5–8.1)

## 2019-06-08 LAB — CBC WITH DIFFERENTIAL/PLATELET
Abs Immature Granulocytes: 0.02 10*3/uL (ref 0.00–0.07)
Basophils Absolute: 0 10*3/uL (ref 0.0–0.1)
Basophils Relative: 0 %
Eosinophils Absolute: 0 10*3/uL (ref 0.0–0.5)
Eosinophils Relative: 0 %
HCT: 43.3 % (ref 36.0–46.0)
Hemoglobin: 14.1 g/dL (ref 12.0–15.0)
Immature Granulocytes: 0 %
Lymphocytes Relative: 20 %
Lymphs Abs: 1.4 10*3/uL (ref 0.7–4.0)
MCH: 30.3 pg (ref 26.0–34.0)
MCHC: 32.6 g/dL (ref 30.0–36.0)
MCV: 93.1 fL (ref 80.0–100.0)
Monocytes Absolute: 0.5 10*3/uL (ref 0.1–1.0)
Monocytes Relative: 8 %
Neutro Abs: 5 10*3/uL (ref 1.7–7.7)
Neutrophils Relative %: 72 %
Platelets: 271 10*3/uL (ref 150–400)
RBC: 4.65 MIL/uL (ref 3.87–5.11)
RDW: 13.7 % (ref 11.5–15.5)
WBC: 7 10*3/uL (ref 4.0–10.5)
nRBC: 0 % (ref 0.0–0.2)

## 2019-06-08 LAB — URINALYSIS, ROUTINE W REFLEX MICROSCOPIC
Bilirubin Urine: NEGATIVE
Glucose, UA: NEGATIVE mg/dL
Hgb urine dipstick: NEGATIVE
Ketones, ur: NEGATIVE mg/dL
Leukocytes,Ua: NEGATIVE
Nitrite: NEGATIVE
Protein, ur: NEGATIVE mg/dL
Specific Gravity, Urine: 1.005 (ref 1.005–1.030)
pH: 7 (ref 5.0–8.0)

## 2019-06-08 LAB — LIPASE, BLOOD: Lipase: 30 U/L (ref 11–51)

## 2019-06-08 MED ORDER — FENTANYL CITRATE (PF) 100 MCG/2ML IJ SOLN
12.5000 ug | Freq: Once | INTRAMUSCULAR | Status: DC
  Filled 2019-06-08: qty 2

## 2019-06-08 MED ORDER — IOHEXOL 9 MG/ML PO SOLN
ORAL | Status: AC
  Filled 2019-06-08: qty 1000

## 2019-06-08 MED ORDER — IOHEXOL 300 MG/ML  SOLN
100.0000 mL | Freq: Once | INTRAMUSCULAR | Status: AC | PRN
  Administered 2019-06-08: 100 mL via INTRAVENOUS

## 2019-06-08 NOTE — Discharge Instructions (Addendum)
Your work-up today is reassuring.  Your ultrasound and CT show that you do have gallstones without infection at this time.  As discussed your total bilirubin is mildly elevated, this will need to be rechecked in a week or so.  Your primary care provider can recheck this for you.  Continue taking your Tylenol as directed for pain or fever if needed.  Turn to the emergency department if you develop worsening chest pain, abdominal pain, fever, or vomiting.

## 2019-06-08 NOTE — ED Triage Notes (Signed)
Pt c/o right upper abdominal pain that radiates around to her back; pt states she had a colitis attack last week

## 2019-06-08 NOTE — ED Notes (Signed)
Family at bedside. 

## 2019-06-08 NOTE — ED Notes (Signed)
Patient transported to CT 

## 2019-06-08 NOTE — ED Provider Notes (Signed)
[facility] Provider Note   CSN: [id number] Arrival date & time: 06/08/19  1000     History Chief Complaint  Patient presents with  . Abdominal Pain    [name] is a 86 y.o. female.  HPI      [name] is a 86 y.o. female with past medical history of atrial fibrillation, coronary artery disease, HTN, and ulcerative colitis who presents to the Emergency Department complaining of right flank and upper abdominal pain that has been persistent for more than 1 week.  She describes the pain as aching and now radiating across her abdomen.  She was seen by PCP and sent for an outpatient ultrasound of her abdomen that showed evidence of gallstones without acute gallbladder disease.  Pain is increasingly more diffuse which prompted her evaluation today.  She also reports a history of forceful, watery diarrhea for 4 days.  She denies melena or hematochezia.  No urinary symptoms.  She denies fever, chills, nausea or vomiting. No chest pain or shortness of breath.  Pain is associated with movement and resolves at rest.   She states that she is scheduled to have a pacemaker placed in 2 days and she is concerned that she may have an abdominal infection or gallbladder issue that would interfere with her having her upcoming procedure.    Past Medical History:  Diagnosis Date  . Arthritis   . Atrial flutter ([place])   . Bladder cancer ([place])   . Coronary atherosclerosis of native coronary artery    a. BMS RCA May 2014 - [place] stent. b. Cath 04/2017 - patent stent, minimal CAD otherwise.  . Depression   . Essential hypertension   . GI bleeding 06/2017   a. melena/small bowel ulcer by capsule endo 06/2017.  Marland Kitchen Hyperlipemia   . Macular degeneration   . NICM (nonischemic cardiomyopathy) ([place])   . Persistent atrial fibrillation ([place])   . Ulcerative colitis     Patient Active Problem List   Diagnosis Date Noted  . Visit for monitoring Tikosyn therapy 08/05/2017    . Malnutrition of moderate degree 06/13/2017  . Hypotension due to blood loss   . Symptomatic anemia 06/12/2017  . Absolute anemia 06/11/2017  . Melena 06/11/2017  . Chronic combined systolic and diastolic CHF, NYHA class 3 ([place]) 02/20/2017  . Acute dyspnea   . Chest pain 02/01/2017  . Acute systolic CHF (congestive heart failure) ([place]) 01/21/2017  . A-fib ([place]) 01/02/2017  . Arthritis of midfoot 01/13/2016  . Right shoulder pain 01/13/2016  . Ulcerative colitis ([place]) 05/03/2014  . Bilateral leg edema 11/30/2013  . Abnormal myocardial perfusion study 09/17/2013  . Coronary atherosclerosis of native coronary artery 09/03/2012  . Secondary cardiomyopathy ([place]) 09/03/2012  . Mixed hyperlipidemia 01/21/2009  . Essential hypertension, benign 01/21/2009  . Atrial flutter ([place]) 10/06/2008    Past Surgical History:  Procedure Laterality Date  . ABDOMINAL HYSTERECTOMY    . APPENDECTOMY    . Bilateral knee replacements      2007, 2008  . BLADDER SURGERY    . CARDIOVERSION N/A 02/04/2017   Procedure: CARDIOVERSION;  Surgeon: Arnoldo Lenis, MD;  Location: [facility];  Service: Endoscopy;  Laterality: N/A;  . CARDIOVERSION N/A 02/26/2017   Procedure: CARDIOVERSION;  Surgeon: Satira Sark, MD;  Location: [facility];  Service: Cardiovascular;  Laterality: N/A;  . CARDIOVERSION N/A 08/07/2017   Procedure: CARDIOVERSION;  Surgeon: Pixie Casino, MD;  Location: [facility];  Service: Cardiovascular;  Laterality: N/A;  . COLONOSCOPY N/A 06/15/2015   Procedure: COLONOSCOPY;  Surgeon: Rogene Houston, MD;  [facility];  Service: Endoscopy;  Laterality: N/A;  210  . CYSTOSCOPY W/ RETROGRADES Bilateral 05/14/2016   Procedure: CYSTOSCOPY WITH BILATERAL RETROGRADE PYELOGRAM;  Surgeon: Raynelle Bring, MD;  Location: [facility];  Service: Urology;  Laterality: Bilateral;  GENERAL ANESTHESIA WITH PARALYSIS  . ESOPHAGOGASTRODUODENOSCOPY (EGD) WITH PROPOFOL N/A 06/14/2017   Procedure:  ESOPHAGOGASTRODUODENOSCOPY (EGD) WITH PROPOFOL;  Surgeon: Rogene Houston, MD;  [facility];  Service: Endoscopy;  Laterality: N/A;  . GIVENS CAPSULE STUDY  06/14/2017   Procedure: GIVENS CAPSULE STUDY;  Surgeon: Rogene Houston, MD;  [facility];  Service: Endoscopy;;  . LEFT HEART CATHETERIZATION WITH CORONARY ANGIOGRAM N/A 10/30/2013   Procedure: LEFT HEART CATHETERIZATION WITH CORONARY ANGIOGRAM;  Surgeon: Burnell Blanks, MD;  Location: [facility] CATH LAB;  Service: Cardiovascular;  Laterality: N/A;  . RIGHT/LEFT HEART CATH AND CORONARY ANGIOGRAPHY N/A 05/03/2017   Procedure: RIGHT/LEFT HEART CATH AND CORONARY ANGIOGRAPHY;  Surgeon: Leonie Man, MD;  Location: [facility] LAB;  Service: Cardiovascular;  Laterality: N/A;  . TEE WITHOUT CARDIOVERSION N/A 02/04/2017   Procedure: TRANSESOPHAGEAL ECHOCARDIOGRAM (TEE) WITH PROPOL;  Surgeon: Arnoldo Lenis, MD;  [facility];  Service: Endoscopy;  Laterality: N/A;  . TONSILLECTOMY    . TOTAL KNEE ARTHROPLASTY    . TRANSURETHRAL RESECTION OF BLADDER TUMOR N/A 05/14/2016   Procedure: TRANSURETHRAL RESECTION OF BLADDER TUMOR (TURBT);  Surgeon: Raynelle Bring, MD;  Location: [facility];  Service: Urology;  Laterality: N/A;  GENERAL ANESTHESIA WITH PARALYSIS  . YAG LASER APPLICATION Bilateral [id number]/01/6382   Procedure: YAG LASER APPLICATION;  Surgeon: Williams Che, MD;  Location: [facility];  Service: Ophthalmology;  Laterality: Bilateral;     OB History   No obstetric history on file.     Family History  Problem Relation Age of Onset  . CAD Father     Social History   Tobacco Use  . Smoking status: Never Smoker  . Smokeless tobacco: Never Used  Substance Use Topics  . Alcohol use: No    Alcohol/week: 0.0 standard drinks  . Drug use: No    Home Medications Prior to Admission medications   Medication Sig Start Date End Date Taking? Authorizing Provider  acetaminophen (TYLENOL) 500 MG tablet Take  1,000 mg by mouth 3 (three) times daily with meals.     [provider]  atorvastatin (LIPITOR) 20 MG tablet Take 1 tablet (20 mg total) by mouth at bedtime. 04/16/19   Satira Sark, MD  balsalazide (COLAZAL) 750 MG capsule TAKE THREE CAPSULES BY MOUTH THREE TIMES DAILY Patient taking differently: Take 2,250 mg by mouth 3 (three) times daily.  05/14/19   Rehman, Mechele Dawley, MD  Cholecalciferol (VITAMIN D-3) 25 MCG (1000 UT) CAPS Take 2,000 Units by mouth daily.     [provider]  Cranberry 1000 MG CAPS Take 4,200 mg by mouth daily. With vit C    [provider]  DHA-EPA-Flaxseed Oil-Vitamin E (THERA TEARS NUTRITION PO) Take by mouth.    [provider]  dofetilide (TIKOSYN) 250 MCG capsule Take 1 capsule (250 mcg total) by mouth 2 (two) times daily. 04/16/19   Camnitz, Will Hassell Done, MD  ENTRESTO 24-26 MG TAKE 1 TABLET BY MOUTH TWICE DAILY - start ENTRESTO 48 HOURS AFTER STOPPING LISINOPRIL Patient taking differently: Take 1 tablet by mouth 2 (two) times daily.  05/14/19  Satira Sark, MD  furosemide (LASIX) 20 MG tablet TAKE TWO TABLETS BY MOUTH TWICE DAILY Patient taking differently: Take 40 mg by mouth 2 (two) times daily.  01/14/19   Satira Sark, MD  Multiple Vitamins-Minerals (ICAPS AREDS 2 PO) Take 2 tablets by mouth daily at 12 noon.     [provider]  nitroGLYCERIN (NITROSTAT) 0.4 MG SL tablet Place 0.4 mg under the tongue every 5 (five) minutes as needed for chest pain.    [provider]  potassium chloride SA (KLOR-CON) 20 MEQ tablet TAKE 1 TABLET BY MOUTH THREE TIMES DAILY Patient taking differently: Take 20 mEq by mouth 3 (three) times daily.  04/16/19   Satira Sark, MD  spironolactone (ALDACTONE) 25 MG tablet Take 0.5 tablets (12.5 mg total) by mouth daily for 90 doses. 11/07/18 06/01/19  Satira Sark, MD  tetrahydrozoline (EYE DROPS) 0.05 % ophthalmic solution Place 1 drop into both eyes daily as needed (Eye  Burning). Thera tears    [provider]  XARELTO 20 MG TABS tablet TAKE 1 TABLET BY MOUTH DAILY WITH SUPPER Patient taking differently: Take 20 mg by mouth daily with supper. High risk med: Anticoagulant 02/16/19   Satira Sark, MD    Allergies    Sinus & allergy [chlorpheniramine-phenylephrine], Carvedilol, Demerol, and Penicillins  Review of Systems   Review of Systems  Constitutional: Negative for appetite change, chills and fever.  Respiratory: Negative for shortness of breath.   Cardiovascular: Negative for chest pain.  Gastrointestinal: Positive for abdominal pain and diarrhea. Negative for blood in stool, nausea and vomiting.  Genitourinary: Positive for flank pain. Negative for decreased urine volume, difficulty urinating and dysuria.  Musculoskeletal: Negative for back pain.  Skin: Negative for color change and rash.  Neurological: Negative for dizziness, weakness and numbness.  Hematological: Negative for adenopathy.    Physical Exam Updated Vital Signs BP 140/62 (BP Location: Right Arm)   Pulse (!) 51   Temp 99 F (37.2 C) (Oral)   Resp 16   Ht 5' 3"  (1.6 m)   Wt 101.2 kg   SpO2 98%   BMI 39.50 kg/m   Physical Exam Vitals and nursing note reviewed.  Constitutional:      General: She is not in acute distress.    Appearance: Normal appearance. She is well-developed. She is not ill-appearing or toxic-appearing.  HENT:     Mouth/Throat:     Mouth: Mucous membranes are moist.  Cardiovascular:     Rate and Rhythm: Normal rate and regular rhythm.     Pulses: Normal pulses.  Pulmonary:     Effort: Pulmonary effort is normal.     Breath sounds: Normal breath sounds.  Chest:     Chest wall: No tenderness.  Abdominal:     General: Bowel sounds are normal.     Palpations: Abdomen is soft.     Tenderness: There is generalized abdominal tenderness.     Comments: Generalized tenderness of the abdomen without guarding or rebound.  Abdomen is soft, no  peritoneal signs.  Musculoskeletal:        General: Normal range of motion.     Right lower leg: No edema.     Left lower leg: No edema.     Comments: Mild ttp of the bilateral flanks. No midline tenderness.    Skin:    General: Skin is warm.     Findings: No rash.  Neurological:     General: No focal deficit  present.     Mental Status: She is alert.     Sensory: No sensory deficit.     Motor: No weakness.     ED Results / Procedures / Treatments   Labs (all labs ordered are listed, but only abnormal results are displayed) Labs Reviewed  COMPREHENSIVE METABOLIC PANEL - Abnormal; Notable for the following components:      Result Value   Glucose, Bld 106 (*)    Total Bilirubin 1.8 (*)    All other components within normal limits  URINALYSIS, ROUTINE W REFLEX MICROSCOPIC - Abnormal; Notable for the following components:   Color, Urine STRAW (*)    All other components within normal limits  LIPASE, BLOOD  CBC WITH DIFFERENTIAL/PLATELET    EKG None  Radiology CT ABDOMEN PELVIS W CONTRAST  Result Date: 06/08/2019 CLINICAL DATA:  Right upper abdominal pain radiating to the back. Nausea. EXAM: CT ABDOMEN AND PELVIS WITH CONTRAST TECHNIQUE: Multidetector CT imaging of the abdomen and pelvis was performed using the standard protocol following bolus administration of intravenous contrast. CONTRAST:  176m OMNIPAQUE IOHEXOL 300 MG/ML  SOLN COMPARISON:  Right upper quadrant ultrasound earlier this day. Abdominal CT 05/04/2016 FINDINGS: Lower chest: Normal heart size. There are coronary artery calcifications. Small hiatal hernia. Hepatobiliary: 12 mm cyst in the left lobe. Additional tiny subcapsular hypodensity in the anterior left lobe. No suspicious or solid lesion. Calcified granuloma centrally. Calcified gallstones without pericholecystic inflammation. No biliary dilatation. Pancreas: No ductal dilatation or inflammation. Spleen: Normal in size without focal abnormality.  Adrenals/Urinary Tract: Normal adrenal glands. No hydronephrosis or perinephric edema. Homogeneous renal enhancement with symmetric excretion on delayed phase imaging. 16 mm cyst in the posterior right kidney. Small low-density lesions in the left kidney that are too small to characterize. Urinary bladder is physiologically distended without wall thickening. Previous bladder wall lesions are no longer seen. Stomach/Bowel: Small hiatal hernia stomach otherwise unremarkable. Positioning ligament of Treitz. There is no small bowel obstruction or inflammation. High-riding cecum in the right mid abdomen. Appendix not visualized, no evidence of appendicitis. Mild sigmoid colonic tortuosity. No colonic wall thickening or inflammatory change. Vascular/Lymphatic: Aortic atherosclerosis. No aortic aneurysm. The portal vein is patent. No enlarged lymph nodes in the abdomen or pelvis. Reproductive: Post hysterectomy. Quiescent ovaries. No adnexal mass. Other: No free air, free fluid, or intra-abdominal fluid collection. Tiny fat containing umbilical hernia. Musculoskeletal: Multilevel degenerative change throughout the spine. Degenerative change of both hips. There are no acute or suspicious osseous abnormalities. IMPRESSION: 1. No acute abnormality in the abdomen/pelvis. 2. Cholelithiasis without gallbladder inflammation. 3. Small hiatal hernia. Aortic Atherosclerosis (ICD10-I70.0). Electronically Signed   By: MKeith RakeM.D.   On: 06/08/2019 15:22   [place]domen Limited  Result Date: 06/08/2019 CLINICAL DATA:  [age]year old female with RIGHT UPPER quadrant pain and nausea 2 days. EXAM: ULTRASOUND ABDOMEN LIMITED RIGHT UPPER QUADRANT COMPARISON:  05/28/2019 ultrasound FINDINGS: Gallbladder: Two mobile gallstones are noted, the largest measuring 1.1 cm. There is no evidence of gallbladder wall thickening, sonographic Murphy sign or pericholecystic fluid. Common bile duct: Diameter: 4 mm. No intrahepatic or extrahepatic  biliary dilatation identified. Liver: Echogenicity is within normal limits. A LEFT renal cyst is present. No suspicious focal hepatic abnormalities are present. Portal vein is patent on color Doppler imaging with normal direction of blood flow towards the liver. Other: None. IMPRESSION: 1. Cholelithiasis without sonographic evidence of acute cholecystitis. 2. No other significant abnormalities. Electronically Signed   By: JMargarette CanadaM.D.   On:  06/08/2019 12:06    Procedures Procedures (including critical care time)  Medications Ordered in ED Medications - No data to display  ED Course  I have reviewed the triage vital signs and the nursing notes.  Pertinent labs & imaging results that were available during my care of the patient were reviewed by me and considered in my medical decision making (see chart for details).    MDM Rules/Calculators/A&P                      Patient history of ulcerative colitis comes in with increasing, diffuse abdominal pain and reports of explosive diarrhea.  No melena or hematochezia.  Patient is well-appearing.  Her abdomen is diffusely tender on exam without guarding or rebound.  She is nontoxic-appearing and vital signs are reassuring.   Pain began as right upper quadrant pain and ultrasound imaging of the right upper quadrant ordered.  Ultrasound shows presence of gallstones without gallbladder wall thickening, sludge or positive Murphy sign.  We will proceed with CT of the abdomen and pelvis to further evaluate for possible diverticulitis or recurrent UC.  Laboratory studies ordered and interpreted by me.  No evidence of leukocytosis or anemia.  No renal insufficiency.  Total bilirubin is slightly elevated at 1.8.  Urinalysis unremarkable.  CT abdomen pelvis confirms presence of gallstones but is otherwise unremarkable.  Have discussed lab and imaging results with the patient and family member who is at bedside. Source of her symptoms are unclear.   I have  informed her that her bilirubin is mildly elevated and will need to be rechecked by her PCP.  She agrees to plan, strict return precautions given.   Final Clinical Impression(s) / ED Diagnoses Final diagnoses:  Generalized abdominal pain    Rx / DC Orders ED Discharge Orders    None       Bufford Lope 06/08/19 1635    Noemi Chapel, MD 06/12/19 2031

## 2019-06-09 ENCOUNTER — Telehealth (INDEPENDENT_AMBULATORY_CARE_PROVIDER_SITE_OTHER): Payer: Self-pay | Admitting: Internal Medicine

## 2019-06-09 ENCOUNTER — Other Ambulatory Visit (HOSPITAL_COMMUNITY): Payer: Medicare Other

## 2019-06-09 NOTE — Telephone Encounter (Signed)
Patient called stated she went to the ED at [facility] on 06/08/19 for stomach issues - states her stomach is swollen today and she is having abnormal bowel movements - please advise - [phone]

## 2019-06-09 NOTE — Telephone Encounter (Signed)
Thayer Headings , please advise.

## 2019-06-09 NOTE — Telephone Encounter (Signed)
Pt reports continued issues.  She experienced pain at her side yesterday and went to [facility] ED for evaluation.  Dr. Curt Bears reviewed. Pt reports continued bouts w/ diarrhea. Pt informed Dr. Curt Bears feels safer to cancel procedure for tomorrow and reschedule at later date.  Pt would like to go in July. Aware I will follow up at later time to determine rescheduled CRT-P implant for July. She is agreeable to plan.

## 2019-06-09 NOTE — Telephone Encounter (Signed)
Er notes reviewed. I would like to see her this week - I do not have openings tomorrow but can see her on Thursday morning. Thanks!

## 2019-06-10 ENCOUNTER — Encounter (HOSPITAL_COMMUNITY): Admission: RE | Payer: Self-pay | Source: Home / Self Care

## 2019-06-10 ENCOUNTER — Ambulatory Visit (HOSPITAL_COMMUNITY): Admission: RE | Admit: 2019-06-10 | Payer: Medicare Other | Source: Home / Self Care | Admitting: Cardiology

## 2019-06-10 SURGERY — BIV PACEMAKER INSERTION CRT-P

## 2019-06-10 NOTE — Telephone Encounter (Signed)
Thayer Headings - there is an opening on Monday, 6/7 at 3:30 - can I put her there?

## 2019-06-10 NOTE — Telephone Encounter (Signed)
Disregard previous message - Dr Laural Golden had an opening on 6-8

## 2019-06-11 ENCOUNTER — Ambulatory Visit (INDEPENDENT_AMBULATORY_CARE_PROVIDER_SITE_OTHER): Payer: Medicare Other | Admitting: Gastroenterology

## 2019-06-11 ENCOUNTER — Other Ambulatory Visit: Payer: Self-pay

## 2019-06-11 ENCOUNTER — Encounter (INDEPENDENT_AMBULATORY_CARE_PROVIDER_SITE_OTHER): Payer: Self-pay | Admitting: Gastroenterology

## 2019-06-11 ENCOUNTER — Ambulatory Visit (INDEPENDENT_AMBULATORY_CARE_PROVIDER_SITE_OTHER): Payer: Medicare Other | Admitting: Nurse Practitioner

## 2019-06-11 VITALS — BP 106/57 | HR 36 | Temp 97.2°F | Ht 63.0 in | Wt 222.5 lb

## 2019-06-11 DIAGNOSIS — K51011 Ulcerative (chronic) pancolitis with rectal bleeding: Secondary | ICD-10-CM

## 2019-06-11 DIAGNOSIS — R1030 Lower abdominal pain, unspecified: Secondary | ICD-10-CM | POA: Diagnosis not present

## 2019-06-11 DIAGNOSIS — K5120 Ulcerative (chronic) proctitis without complications: Secondary | ICD-10-CM

## 2019-06-11 DIAGNOSIS — R197 Diarrhea, unspecified: Secondary | ICD-10-CM | POA: Diagnosis not present

## 2019-06-11 MED ORDER — DICYCLOMINE HCL 10 MG PO CAPS
10.0000 mg | ORAL_CAPSULE | Freq: Two times a day (BID) | ORAL | 0 refills | Status: DC | PRN
Start: 2019-06-11 — End: 2020-03-29

## 2019-06-11 NOTE — Progress Notes (Signed)
Patient profile: [name] is a 86 y.o. female seen for evaluation of   History of Present Illness: [name] is seen today for evaluation of "colitis symptoms"  Patient reports 1 week ago she had symptoms similar to a "colitis attack" which she has had frequently in past.  She describes 4-5 episodes of explosive diarrhea with some stomach discomfort and swelling.  She has had these episodes in the past and has felt well after 1 to 2 days, but the most current episode has continued and has had mild symptoms over the past 1 week.  She reports a poor appetite with the symptoms.  Feels has bilateral abdominal pain but is worse on the right side.  Does not seem related to food intake.  Also has some pain in her right back and right abdomen when she walks.  She is typically having between 1-6 bowel movements a day.  These are a more liquid consistency than normal.  She denies any blood in her stool.  Her weight is slightly down from her baseline as below. She denies missed doses of mesalamine therapy.   She denies any GERD or dysphagia symptoms  Wt Readings from Last 3 Encounters:  06/11/19 222 lb 8 oz (100.9 kg)  06/08/19 223 lb (101.2 kg)  05/08/19 229 lb (103.9 kg)     Last Colonoscopy: 2017- - Two 4 to 7 mm polyps in the cecum, removed with a  cold snare. Resected and retrieved. Clip (MR conditional) was placed. - Three small polyps in the sigmoid colon and at the splenic flexure. Biopsied.  - The entire examined colon is normal on direct and retroflexion views   Last Endoscopy: Endoscopy 2019-2 cm hiatal hernia, irregular Z-line.  Procedure done for IDA   Past Medical History:  Past Medical History:  Diagnosis Date  . Arthritis   . Atrial flutter ([place])   . Bladder cancer ([place])   . Coronary atherosclerosis of native coronary artery    a. BMS RCA May 2014 - [place] stent. b. Cath 04/2017 - patent stent, minimal CAD otherwise.  . Depression   . Essential  hypertension   . GI bleeding 06/2017   a. melena/small bowel ulcer by capsule endo 06/2017.  [place] Kitchen Hyperlipemia   . Macular degeneration   . NICM (nonischemic cardiomyopathy) ([place])   . Persistent atrial fibrillation ([place])   . Ulcerative colitis     Problem List: Patient Active Problem List   Diagnosis Date Noted  . Visit for monitoring Tikosyn therapy 08/05/2017  . Malnutrition of moderate degree 06/13/2017  . Hypotension due to blood loss   . Symptomatic anemia 06/12/2017  . Absolute anemia 06/11/2017  . Melena 06/11/2017  . Chronic combined systolic and diastolic CHF, NYHA class 3 ([place]) 02/20/2017  . Acute dyspnea   . Chest pain 02/01/2017  . Acute systolic CHF (congestive heart failure) ([place]) 01/21/2017  . A-fib ([place]) 01/02/2017  . Arthritis of midfoot 01/13/2016  . Right shoulder pain 01/13/2016  . Ulcerative colitis ([place]) 05/03/2014  . Bilateral leg edema 11/30/2013  . Abnormal myocardial perfusion study 09/17/2013  . Coronary atherosclerosis of native coronary artery 09/03/2012  . Secondary cardiomyopathy ([place]) 09/03/2012  . Mixed hyperlipidemia 01/21/2009  . Essential hypertension, benign 01/21/2009  . Atrial flutter ([place]) 10/06/2008    Past Surgical History: Past Surgical History:  Procedure Laterality Date  . ABDOMINAL HYSTERECTOMY    . APPENDECTOMY    . Bilateral knee replacements      2007, 2008  .  BLADDER SURGERY    . CARDIOVERSION N/A 02/04/2017   Procedure: CARDIOVERSION;  Surgeon: Arnoldo Lenis, MD;  [facility];  Service: Endoscopy;  Laterality: N/A;  . CARDIOVERSION N/A 02/26/2017   Procedure: CARDIOVERSION;  Surgeon: Satira Sark, MD;  Location: [facility];  Service: Cardiovascular;  Laterality: N/A;  . CARDIOVERSION N/A 08/07/2017   Procedure: CARDIOVERSION;  Surgeon: Pixie Casino, MD;  Location: [place];  Service: Cardiovascular;  Laterality: N/A;  . COLONOSCOPY N/A 06/15/2015   Procedure: COLONOSCOPY;  Surgeon: Rogene Houston,  MD;  [facility];  Service: Endoscopy;  Laterality: N/A;  210  . CYSTOSCOPY W/ RETROGRADES Bilateral 05/14/2016   Procedure: CYSTOSCOPY WITH BILATERAL RETROGRADE PYELOGRAM;  Surgeon: Raynelle Bring, MD;  [facility]: [facility];  Service: Urology;  Laterality: Bilateral;  GENERAL ANESTHESIA WITH PARALYSIS  . ESOPHAGOGASTRODUODENOSCOPY (EGD) WITH PROPOFOL N/A 06/14/2017   Procedure: ESOPHAGOGASTRODUODENOSCOPY (EGD) WITH PROPOFOL;  Surgeon: Rogene Houston, MD;  [facility];  Service: Endoscopy;  Laterality: N/A;  . GIVENS CAPSULE STUDY  06/14/2017   Procedure: GIVENS CAPSULE STUDY;  Surgeon: Rogene Houston, MD;  [facility];  Service: Endoscopy;;  . LEFT HEART CATHETERIZATION WITH CORONARY ANGIOGRAM N/A 10/30/2013   Procedure: LEFT HEART CATHETERIZATION WITH CORONARY ANGIOGRAM;  Surgeon: Burnell Blanks, MD;  Location: [facility] CATH LAB;  Service: Cardiovascular;  Laterality: N/A;  . RIGHT/LEFT HEART CATH AND CORONARY ANGIOGRAPHY N/A 05/03/2017   Procedure: RIGHT/LEFT HEART CATH AND CORONARY ANGIOGRAPHY;  Surgeon: Leonie Man, MD;  Location: [place] CV LAB;  Service: Cardiovascular;  Laterality: N/A;  . TEE WITHOUT CARDIOVERSION N/A 02/04/2017   Procedure: TRANSESOPHAGEAL ECHOCARDIOGRAM (TEE) WITH PROPOL;  Surgeon: Arnoldo Lenis, MD;  [facility];  Service: Endoscopy;  Laterality: N/A;  . TONSILLECTOMY    . TOTAL KNEE ARTHROPLASTY    . TRANSURETHRAL RESECTION OF BLADDER TUMOR N/A 05/14/2016   Procedure: TRANSURETHRAL RESECTION OF BLADDER TUMOR (TURBT);  Surgeon: Raynelle Bring, MD;  [facility];  Service: Urology;  Laterality: N/A;  GENERAL ANESTHESIA WITH PARALYSIS  . YAG LASER APPLICATION Bilateral 19/04/1738   Procedure: YAG LASER APPLICATION;  Surgeon: Williams Che, MD;  Location[facility];  Service: Ophthalmology;  Laterality: Bilateral;    Allergies: Allergies  Allergen Reactions  . Sinus & Allergy [Chlorpheniramine-Phenylephrine]  Shortness Of Breath  . Carvedilol Itching and Rash    Facial rash/itching  . Demerol Rash  . Penicillins Rash and Other (See Comments)    REACTION: rash, years ago Has patient had a PCN reaction causing immediate rash, facial/tongue/throat swelling, SOB or lightheadedness with hypotension: Yes Has patient had a PCN reaction causing severe rash involving mucus membranes or skin necrosis: No Has patient had a PCN reaction that required hospitalization No Has patient had a PCN reaction occurring within the last 10 years: No If all of the above answers are "NO", then may proceed with Cephalosporin use.       Home Medications:  Current Outpatient Medications:  .  acetaminophen (TYLENOL) 500 MG tablet, Take 1,000 mg by mouth 3 (three) times daily with meals. , Disp: , Rfl:  .  atorvastatin (LIPITOR) 20 MG tablet, Take 1 tablet (20 mg total) by mouth at bedtime., Disp: 90 tablet, Rfl: 3 .  balsalazide (COLAZAL) 750 MG capsule, TAKE THREE CAPSULES BY MOUTH THREE TIMES DAILY (Patient taking differently: Take 2,250 mg by mouth 3 (three) times daily. ), Disp: 270 capsule, Rfl: 5 .  Cholecalciferol (VITAMIN  D-3) 25 MCG (1000 UT) CAPS, Take 2,000 Units by mouth daily. , Disp: , Rfl:  .  Cranberry 1000 MG CAPS, Take 4,200 mg by mouth daily. With vit C, Disp: , Rfl:  .  dofetilide (TIKOSYN) 250 MCG capsule, Take 1 capsule (250 mcg total) by mouth 2 (two) times daily., Disp: 60 capsule, Rfl: 11 .  ENTRESTO 24-26 MG, TAKE 1 TABLET BY MOUTH TWICE DAILY - start ENTRESTO 48 HOURS AFTER STOPPING LISINOPRIL (Patient taking differently: Take 1 tablet by mouth 2 (two) times daily. ), Disp: 60 tablet, Rfl: 6 .  furosemide (LASIX) 20 MG tablet, TAKE TWO TABLETS BY MOUTH TWICE DAILY (Patient taking differently: Take 40 mg by mouth 2 (two) times daily. ), Disp: 360 tablet, Rfl: 1 .  Multiple Vitamins-Minerals (ICAPS AREDS 2 PO), Take 2 tablets by mouth daily at 12 noon. , Disp: , Rfl:  .  nitroGLYCERIN (NITROSTAT)  0.4 MG SL tablet, Place 0.4 mg under the tongue every 5 (five) minutes as needed for chest pain., Disp: , Rfl:  .  potassium chloride SA (KLOR-CON) 20 MEQ tablet, TAKE 1 TABLET BY MOUTH THREE TIMES DAILY (Patient taking differently: Take 20 mEq by mouth 3 (three) times daily. ), Disp: 90 tablet, Rfl: 6 .  tetrahydrozoline (EYE DROPS) 0.05 % ophthalmic solution, Place 1 drop into both eyes daily as needed (Eye Burning). Thera tears, Disp: , Rfl:  .  XARELTO 20 MG TABS tablet, TAKE 1 TABLET BY MOUTH DAILY WITH SUPPER (Patient taking differently: Take 20 mg by mouth daily with supper. High risk med: Anticoagulant), Disp: 30 tablet, Rfl: 6 .  dicyclomine (BENTYL) 10 MG capsule, Take 1 capsule (10 mg total) by mouth 2 (two) times daily as needed for spasms (abd pain)., Disp: 30 capsule, Rfl: 0 .  spironolactone (ALDACTONE) 25 MG tablet, Take 0.5 tablets (12.5 mg total) by mouth daily for 90 doses., Disp: 45 tablet, Rfl: 3   Family History: family history includes CAD in her father.    Social History:   reports that she has never smoked. She has never used smokeless tobacco. She reports that she does not drink alcohol or use drugs.   Review of Systems: Constitutional: Denies weight loss/weight gain  Eyes: No changes in vision. ENT: No oral lesions, sore throat.  GI: see HPI.  Heme/Lymph: No easy bruising.  CV: No chest pain.  GU: No hematuria.  Integumentary: No rashes.  Neuro: No headaches.  Psych: No depression/anxiety.  Endocrine: No heat/cold intolerance.  Allergic/Immunologic: No urticaria.  Resp: No cough, SOB.  Musculoskeletal: No joint swelling.    Physical Examination: BP (!) 106/57 (BP Location: Right Arm, Patient Position: Sitting, Cuff Size: Large)   Pulse (!) 36 Comment: Rpeat with pulse ox was 38  Temp (!) 97.2 F (36.2 C) (Temporal)   Ht 5' 3"  (1.6 m)   Wt 222 lb 8 oz (100.9 kg)   BMI 39.41 kg/m  Gen: NAD, alert and oriented x 4 HEENT: PEERLA, EOMI, Neck: supple, no  JVD Chest: CTA bilaterally, no wheezes, crackles, or other adventitious sounds CV: bradycardia, no m/g/c/r Abd: soft, NT, ND, +BS in all four quadrants; no HSM, guarding, ridigity, or rebound tenderness Ext: no edema, well perfused with 2+ pulses, Skin: no rash or lesions noted on observed skin Lymph: no noted LAD  Data Reviewed:  06/08/19--IMPRESSION:CT scan 1. No acute abnormality in the abdomen/pelvis. 2. Cholelithiasis without gallbladder inflammation. 3. Small hiatal hernia. 4. Aortic Atherosclerosis   Assessment/Plan: [name] is a  86 y.o. female with history of ulcerative colitis maintained on balsalazide seen today for abdominal pain after ER visit 06/08/19 with CT as above.  She reports flare-like symptoms of ulcerative colitis-we will start by checking labs, stool studies, fecal calprotectin. CT did not show colonic abnormalities 4 days ago. She can use dicyclomine in interim. [place] diet reviewed. Depending on results may need course of steroids.   Pulse today in office was 38-her pacemaker implantation scheduled for today was canceled by cardiology given her GI symptoms.  Will discuss further with Dr. Laural Golden she denies any acute cardiac symptoms of shortness of breath, chest pain, dizziness currently in office-reviewed ER return precautions. She has a BP cuff with pulse rate and monitors daily at home.   [name] was seen today for follow-up.  Diagnoses and all orders for this visit:  Ulcerative proctitis without complication (HCC) -     C-reactive protein -     CBC with Differential -     COMPLETE METABOLIC PANEL WITH GFR -     Calprotectin, Fecal -     Gastrointestinal Pathogen Panel PCR -     C. difficile GDH and Toxin A/B  Ulcerative pancolitis with rectal bleeding (HCC)  Diarrhea, unspecified type -     C-reactive protein -     CBC with Differential -     COMPLETE METABOLIC PANEL WITH GFR -     Calprotectin, Fecal -     Gastrointestinal Pathogen Panel PCR -      C. difficile GDH and Toxin A/B  Lower abdominal pain -     C-reactive protein -     CBC with Differential -     COMPLETE METABOLIC PANEL WITH GFR -     Calprotectin, Fecal -     Gastrointestinal Pathogen Panel PCR -     C. difficile GDH and Toxin A/B  Other orders -     dicyclomine (BENTYL) 10 MG capsule; Take 1 capsule (10 mg total) by mouth 2 (two) times daily as needed for spasms (abd pain).       I personally performed the service, non-incident to. (WP)  Laurine Blazer, [facility]/31/*

## 2019-06-11 NOTE — Patient Instructions (Signed)
We are checking labs and stool studies for evaluation. Can try dicyclomine as needed to see if this will help abd pain.

## 2019-06-12 LAB — CBC WITH DIFFERENTIAL/PLATELET
Absolute Monocytes: 630 {cells}/uL (ref 200–950)
Basophils Absolute: 38 {cells}/uL (ref 0–200)
Basophils Relative: 0.5 %
Eosinophils Absolute: 128 {cells}/uL (ref 15–500)
Eosinophils Relative: 1.7 %
HCT: 41.1 % (ref 35.0–45.0)
Hemoglobin: 13.7 g/dL (ref 11.7–15.5)
Lymphs Abs: 2258 {cells}/uL (ref 850–3900)
MCH: 31.1 pg (ref 27.0–33.0)
MCHC: 33.3 g/dL (ref 32.0–36.0)
MCV: 93.2 fL (ref 80.0–100.0)
MPV: 11.3 fL (ref 7.5–12.5)
Monocytes Relative: 8.4 %
Neutro Abs: 4448 {cells}/uL (ref 1500–7800)
Neutrophils Relative %: 59.3 %
Platelets: 284 Thousand/uL (ref 140–400)
RBC: 4.41 Million/uL (ref 3.80–5.10)
RDW: 13.3 % (ref 11.0–15.0)
Total Lymphocyte: 30.1 %
WBC: 7.5 Thousand/uL (ref 3.8–10.8)

## 2019-06-12 LAB — COMPLETE METABOLIC PANEL WITHOUT GFR
AG Ratio: 1.6 (calc) (ref 1.0–2.5)
ALT: 13 U/L (ref 6–29)
AST: 18 U/L (ref 10–35)
Albumin: 4.3 g/dL (ref 3.6–5.1)
Alkaline phosphatase (APISO): 52 U/L (ref 37–153)
BUN/Creatinine Ratio: 16 (calc) (ref 6–22)
BUN: 17 mg/dL (ref 7–25)
CO2: 26 mmol/L (ref 20–32)
Calcium: 9.9 mg/dL (ref 8.6–10.4)
Chloride: 99 mmol/L (ref 98–110)
Creat: 1.05 mg/dL — ABNORMAL HIGH (ref 0.60–0.88)
GFR, Est African American: 55 mL/min/1.73m2 — ABNORMAL LOW
GFR, Est Non African American: 48 mL/min/1.73m2 — ABNORMAL LOW
Globulin: 2.7 g/dL (ref 1.9–3.7)
Glucose, Bld: 97 mg/dL (ref 65–139)
Potassium: 4.8 mmol/L (ref 3.5–5.3)
Sodium: 135 mmol/L (ref 135–146)
Total Bilirubin: 1.4 mg/dL — ABNORMAL HIGH (ref 0.2–1.2)
Total Protein: 7 g/dL (ref 6.1–8.1)

## 2019-06-12 LAB — C-REACTIVE PROTEIN: CRP: 0.5 mg/L

## 2019-06-14 DIAGNOSIS — Z20822 Contact with and (suspected) exposure to covid-19: Secondary | ICD-10-CM | POA: Diagnosis not present

## 2019-06-14 DIAGNOSIS — Z880 Allergy status to penicillin: Secondary | ICD-10-CM | POA: Diagnosis not present

## 2019-06-14 DIAGNOSIS — K8020 Calculus of gallbladder without cholecystitis without obstruction: Secondary | ICD-10-CM | POA: Diagnosis not present

## 2019-06-14 DIAGNOSIS — Z8551 Personal history of malignant neoplasm of bladder: Secondary | ICD-10-CM | POA: Diagnosis not present

## 2019-06-14 DIAGNOSIS — R109 Unspecified abdominal pain: Secondary | ICD-10-CM | POA: Diagnosis not present

## 2019-06-14 DIAGNOSIS — I429 Cardiomyopathy, unspecified: Secondary | ICD-10-CM | POA: Diagnosis not present

## 2019-06-14 DIAGNOSIS — I420 Dilated cardiomyopathy: Secondary | ICD-10-CM | POA: Diagnosis not present

## 2019-06-14 DIAGNOSIS — R5381 Other malaise: Secondary | ICD-10-CM | POA: Diagnosis not present

## 2019-06-14 DIAGNOSIS — R748 Abnormal levels of other serum enzymes: Secondary | ICD-10-CM | POA: Diagnosis not present

## 2019-06-14 DIAGNOSIS — R1031 Right lower quadrant pain: Secondary | ICD-10-CM | POA: Diagnosis not present

## 2019-06-14 DIAGNOSIS — I10 Essential (primary) hypertension: Secondary | ICD-10-CM | POA: Diagnosis not present

## 2019-06-14 DIAGNOSIS — R1084 Generalized abdominal pain: Secondary | ICD-10-CM | POA: Diagnosis not present

## 2019-06-14 DIAGNOSIS — Z96653 Presence of artificial knee joint, bilateral: Secondary | ICD-10-CM | POA: Diagnosis not present

## 2019-06-14 DIAGNOSIS — M48061 Spinal stenosis, lumbar region without neurogenic claudication: Secondary | ICD-10-CM | POA: Diagnosis not present

## 2019-06-14 DIAGNOSIS — R1011 Right upper quadrant pain: Secondary | ICD-10-CM | POA: Diagnosis not present

## 2019-06-14 DIAGNOSIS — I5022 Chronic systolic (congestive) heart failure: Secondary | ICD-10-CM | POA: Diagnosis not present

## 2019-06-14 DIAGNOSIS — M549 Dorsalgia, unspecified: Secondary | ICD-10-CM | POA: Diagnosis not present

## 2019-06-14 DIAGNOSIS — Z6841 Body Mass Index (BMI) 40.0 and over, adult: Secondary | ICD-10-CM | POA: Diagnosis not present

## 2019-06-14 DIAGNOSIS — Z7901 Long term (current) use of anticoagulants: Secondary | ICD-10-CM | POA: Diagnosis not present

## 2019-06-14 DIAGNOSIS — I480 Paroxysmal atrial fibrillation: Secondary | ICD-10-CM | POA: Diagnosis not present

## 2019-06-15 ENCOUNTER — Telehealth: Payer: Self-pay | Admitting: Cardiology

## 2019-06-15 ENCOUNTER — Telehealth (INDEPENDENT_AMBULATORY_CARE_PROVIDER_SITE_OTHER): Payer: Self-pay | Admitting: Gastroenterology

## 2019-06-15 MED ORDER — SACUBITRIL-VALSARTAN 24-26 MG PO TABS
1.00 | ORAL_TABLET | ORAL | Status: DC
Start: 2019-06-16 — End: 2019-06-15

## 2019-06-15 MED ORDER — GUAIFENESIN 100 MG/5ML PO SYRP
200.00 | ORAL_SOLUTION | ORAL | Status: DC
Start: ? — End: 2019-06-15

## 2019-06-15 MED ORDER — RIVAROXABAN 10 MG PO TABS
20.00 | ORAL_TABLET | ORAL | Status: DC
Start: 2019-06-16 — End: 2019-06-15

## 2019-06-15 MED ORDER — DOFETILIDE 125 MCG PO CAPS
250.00 | ORAL_CAPSULE | ORAL | Status: DC
Start: 2019-06-16 — End: 2019-06-15

## 2019-06-15 MED ORDER — ACETAMINOPHEN 325 MG PO TABS
650.00 | ORAL_TABLET | ORAL | Status: DC
Start: ? — End: 2019-06-15

## 2019-06-15 MED ORDER — SPIRONOLACTONE 25 MG PO TABS
12.50 | ORAL_TABLET | ORAL | Status: DC
Start: 2019-06-17 — End: 2019-06-15

## 2019-06-15 MED ORDER — FUROSEMIDE 40 MG PO TABS
40.00 | ORAL_TABLET | ORAL | Status: DC
Start: 2019-06-17 — End: 2019-06-15

## 2019-06-15 MED ORDER — MORPHINE SULFATE 10 MG/ML IJ SOLN
2.00 | INTRAMUSCULAR | Status: DC
Start: ? — End: 2019-06-15

## 2019-06-15 MED ORDER — SODIUM CHLORIDE 0.9 % IV SOLN
50.00 | INTRAVENOUS | Status: DC
Start: ? — End: 2019-06-15

## 2019-06-15 MED ORDER — CALCIUM CARBONATE 1250 (500 CA) MG PO CHEW
CHEWABLE_TABLET | ORAL | Status: DC
Start: ? — End: 2019-06-15

## 2019-06-15 NOTE — Telephone Encounter (Signed)
Patient's daughter Shauna Hugh calling and states that the patient is in the hospital at [facility] being treated for gallstones. She states that the patient is waiting to be admitted and has not eaten anything since yesterday and has not been given her tikosyn. Made Diane aware that she is not on the DPR and she should try to contact the providers at the hospital to voice her concerns. Will forward to Dr. Curt Bears to make aware.

## 2019-06-15 NOTE — Telephone Encounter (Signed)
Follow up    Pts daughter is returning call   Please call back

## 2019-06-15 NOTE — Telephone Encounter (Signed)
New Message   Pts daughter is calling and says the pt has been in the ER in [place]  She feels like no one there is doing anything for her, she hasnt ate in over 24 hours.  Pts daughter says she has missed two doses of her Tikosyn  Pts daughter is concerned, she is wondering what she needs to do    Please call

## 2019-06-15 NOTE — Telephone Encounter (Signed)
Patients daughter Trevor Mace) left voice mail message stating patient has been in the ED at [facility] since yesterday afternoon - would like a call back - ph# [phone]

## 2019-06-15 NOTE — Telephone Encounter (Signed)
Left message to call back  

## 2019-06-16 ENCOUNTER — Ambulatory Visit (INDEPENDENT_AMBULATORY_CARE_PROVIDER_SITE_OTHER): Payer: Medicare Other | Admitting: Internal Medicine

## 2019-06-16 NOTE — Telephone Encounter (Signed)
Spoke to dtr, Diane - ok per pt who I spoke w/.  Also spoke to pt. She reports pt was restarted on Tikosyn and that she only missed 2 doses.  Informed this was good news as pt should not miss more that 2 or 3 doses before re-admission may be needed. Patient & dtr verbalized understanding and agreeable to plan.

## 2019-06-16 NOTE — Telephone Encounter (Signed)
Follow up    Pts daughter is calling back, she says she missed a call   Please call back

## 2019-06-17 NOTE — Telephone Encounter (Signed)
I discussed w/ patients daughter yesterday afternoon via phone.

## 2019-06-19 DIAGNOSIS — I259 Chronic ischemic heart disease, unspecified: Secondary | ICD-10-CM | POA: Diagnosis not present

## 2019-06-19 DIAGNOSIS — R1011 Right upper quadrant pain: Secondary | ICD-10-CM | POA: Diagnosis not present

## 2019-06-19 DIAGNOSIS — I700 Atherosclerosis of aorta: Secondary | ICD-10-CM | POA: Diagnosis not present

## 2019-06-19 DIAGNOSIS — M47812 Spondylosis without myelopathy or radiculopathy, cervical region: Secondary | ICD-10-CM | POA: Diagnosis not present

## 2019-06-19 DIAGNOSIS — I480 Paroxysmal atrial fibrillation: Secondary | ICD-10-CM | POA: Diagnosis not present

## 2019-06-19 DIAGNOSIS — I5022 Chronic systolic (congestive) heart failure: Secondary | ICD-10-CM | POA: Diagnosis not present

## 2019-06-19 DIAGNOSIS — C679 Malignant neoplasm of bladder, unspecified: Secondary | ICD-10-CM | POA: Diagnosis not present

## 2019-06-19 DIAGNOSIS — R001 Bradycardia, unspecified: Secondary | ICD-10-CM | POA: Diagnosis not present

## 2019-06-19 DIAGNOSIS — M6281 Muscle weakness (generalized): Secondary | ICD-10-CM | POA: Diagnosis not present

## 2019-06-19 DIAGNOSIS — K8020 Calculus of gallbladder without cholecystitis without obstruction: Secondary | ICD-10-CM | POA: Diagnosis not present

## 2019-06-19 DIAGNOSIS — Z7901 Long term (current) use of anticoagulants: Secondary | ICD-10-CM | POA: Diagnosis not present

## 2019-06-19 DIAGNOSIS — I429 Cardiomyopathy, unspecified: Secondary | ICD-10-CM | POA: Diagnosis not present

## 2019-06-23 ENCOUNTER — Ambulatory Visit: Payer: Medicare Other

## 2019-06-25 DIAGNOSIS — K8020 Calculus of gallbladder without cholecystitis without obstruction: Secondary | ICD-10-CM | POA: Diagnosis not present

## 2019-06-25 DIAGNOSIS — M549 Dorsalgia, unspecified: Secondary | ICD-10-CM | POA: Diagnosis not present

## 2019-06-25 DIAGNOSIS — R109 Unspecified abdominal pain: Secondary | ICD-10-CM | POA: Diagnosis not present

## 2019-06-30 ENCOUNTER — Telehealth (INDEPENDENT_AMBULATORY_CARE_PROVIDER_SITE_OTHER): Payer: Self-pay | Admitting: Internal Medicine

## 2019-06-30 NOTE — Telephone Encounter (Signed)
Patient left a voice mail message stating when she went to [facility] they took her off of some medication that Dr Laural Golden had put her on  - and she wants to know if she should stay off of this medication - please advise [phone]

## 2019-07-01 NOTE — Telephone Encounter (Signed)
Hospitalization from [facility] reviewed.  It looks like per her discharge summary she should continue balsalazide which I agree with.  She should contact us if she continues to have "colitis flare"-like symptoms.  They check stool studies while at [facility] and these were negative.

## 2019-07-01 NOTE — Telephone Encounter (Signed)
I talked with the patient. She was seen in our office earlier this month and that following Sunday. Patient tells me that she was taken off of the Balsalazide at this time and she has been off of it every since. When ask why she taken off she could not answer. She was also taken off all her vitamins.  The stool study that was ordered here , she had been unable to collect due to urine being in it. She tells me that they did stool studies at [facility])  I have sent a request to their Medical Records Department requesting records of this admission. Patient was advised that once received , reviewed, either the provider or myself would call with further recommendations.  She did day that she had had a Colitis Flare several days ago , but at this current time her bowels are normal.

## 2019-07-02 NOTE — Telephone Encounter (Signed)
Talked with the patient. She was given the instruction that she was to have resumed the Balsalazide. She was made aware that all the stool studies were negative.  She is asking about her Gallbladder.. she know that she has Gallstones and has know for 3 years. She was told in the hospital that the Gallbladder was not the reason for her pain.  She is to have a CT of her Spine next Wednesday . She questions if this may be the reason for her pain in the back ,waste line , side. Patient has not been able to lay in the bed. She now uses the lift Chair.  She would like for you to review and advise her about her Gallbladder.

## 2019-07-06 NOTE — Telephone Encounter (Signed)
  Thayer Headings - this is just FYI from out conversation this morning.  Patient was called and given the recommendation from [facility].  Patient is to have the MRI this Wednesday  She did state that she was up for about 2 hours with a sharpe pain in her right side last night. She gave an example of , if she hangs up a robe that will cause this sharpe pain.  Patient agrees that after the MRI , it may tell if it is the Gallbladder or the issues with her spine.

## 2019-07-06 NOTE — Telephone Encounter (Signed)
Many patients have gallstones that do not cause any symptoms. As long as they are not having significant gallbladder symptoms such as pain in the right side with nausea after eating we do not recommend gallbladder removal.  Pain from her spine can certainly radiate around to her abdomen and she should watch if her pain worsens with movement, etc.  We will know more after her CT.

## 2019-07-08 DIAGNOSIS — M545 Low back pain: Secondary | ICD-10-CM | POA: Diagnosis not present

## 2019-07-08 DIAGNOSIS — I428 Other cardiomyopathies: Secondary | ICD-10-CM | POA: Diagnosis not present

## 2019-07-08 DIAGNOSIS — I5022 Chronic systolic (congestive) heart failure: Secondary | ICD-10-CM | POA: Diagnosis not present

## 2019-07-08 DIAGNOSIS — M48061 Spinal stenosis, lumbar region without neurogenic claudication: Secondary | ICD-10-CM | POA: Diagnosis not present

## 2019-07-08 DIAGNOSIS — M7138 Other bursal cyst, other site: Secondary | ICD-10-CM | POA: Diagnosis not present

## 2019-07-08 DIAGNOSIS — D1809 Hemangioma of other sites: Secondary | ICD-10-CM | POA: Diagnosis not present

## 2019-07-08 DIAGNOSIS — I482 Chronic atrial fibrillation, unspecified: Secondary | ICD-10-CM | POA: Diagnosis not present

## 2019-07-08 DIAGNOSIS — M40204 Unspecified kyphosis, thoracic region: Secondary | ICD-10-CM | POA: Diagnosis not present

## 2019-07-08 DIAGNOSIS — I110 Hypertensive heart disease with heart failure: Secondary | ICD-10-CM | POA: Diagnosis not present

## 2019-07-08 DIAGNOSIS — M47814 Spondylosis without myelopathy or radiculopathy, thoracic region: Secondary | ICD-10-CM | POA: Diagnosis not present

## 2019-07-08 DIAGNOSIS — M47816 Spondylosis without myelopathy or radiculopathy, lumbar region: Secondary | ICD-10-CM | POA: Diagnosis not present

## 2019-07-09 ENCOUNTER — Ambulatory Visit (INDEPENDENT_AMBULATORY_CARE_PROVIDER_SITE_OTHER): Payer: Medicare Other | Admitting: Gastroenterology

## 2019-07-17 DIAGNOSIS — K529 Noninfective gastroenteritis and colitis, unspecified: Secondary | ICD-10-CM | POA: Diagnosis not present

## 2019-07-17 DIAGNOSIS — M4800 Spinal stenosis, site unspecified: Secondary | ICD-10-CM | POA: Diagnosis not present

## 2019-07-19 DIAGNOSIS — Z7901 Long term (current) use of anticoagulants: Secondary | ICD-10-CM | POA: Diagnosis not present

## 2019-07-19 DIAGNOSIS — I5022 Chronic systolic (congestive) heart failure: Secondary | ICD-10-CM | POA: Diagnosis not present

## 2019-07-19 DIAGNOSIS — M6281 Muscle weakness (generalized): Secondary | ICD-10-CM | POA: Diagnosis not present

## 2019-07-19 DIAGNOSIS — M47812 Spondylosis without myelopathy or radiculopathy, cervical region: Secondary | ICD-10-CM | POA: Diagnosis not present

## 2019-07-19 DIAGNOSIS — I480 Paroxysmal atrial fibrillation: Secondary | ICD-10-CM | POA: Diagnosis not present

## 2019-07-19 DIAGNOSIS — I259 Chronic ischemic heart disease, unspecified: Secondary | ICD-10-CM | POA: Diagnosis not present

## 2019-07-19 DIAGNOSIS — I700 Atherosclerosis of aorta: Secondary | ICD-10-CM | POA: Diagnosis not present

## 2019-07-19 DIAGNOSIS — R1011 Right upper quadrant pain: Secondary | ICD-10-CM | POA: Diagnosis not present

## 2019-07-19 DIAGNOSIS — I429 Cardiomyopathy, unspecified: Secondary | ICD-10-CM | POA: Diagnosis not present

## 2019-07-19 DIAGNOSIS — K8020 Calculus of gallbladder without cholecystitis without obstruction: Secondary | ICD-10-CM | POA: Diagnosis not present

## 2019-07-19 DIAGNOSIS — C679 Malignant neoplasm of bladder, unspecified: Secondary | ICD-10-CM | POA: Diagnosis not present

## 2019-07-19 DIAGNOSIS — R001 Bradycardia, unspecified: Secondary | ICD-10-CM | POA: Diagnosis not present

## 2019-07-21 ENCOUNTER — Other Ambulatory Visit: Payer: Self-pay | Admitting: Cardiology

## 2019-07-21 DIAGNOSIS — M4316 Spondylolisthesis, lumbar region: Secondary | ICD-10-CM | POA: Diagnosis not present

## 2019-07-21 DIAGNOSIS — M48061 Spinal stenosis, lumbar region without neurogenic claudication: Secondary | ICD-10-CM | POA: Diagnosis not present

## 2019-07-21 IMAGING — DX DG CHEST 2V
2 series · 2 of 2 positions shown · non-contrast
Comparison: January 21, 2017

CLINICAL DATA: Chest pain and shortness of breath

EXAM:
CHEST  2 VIEW

[chest lat]
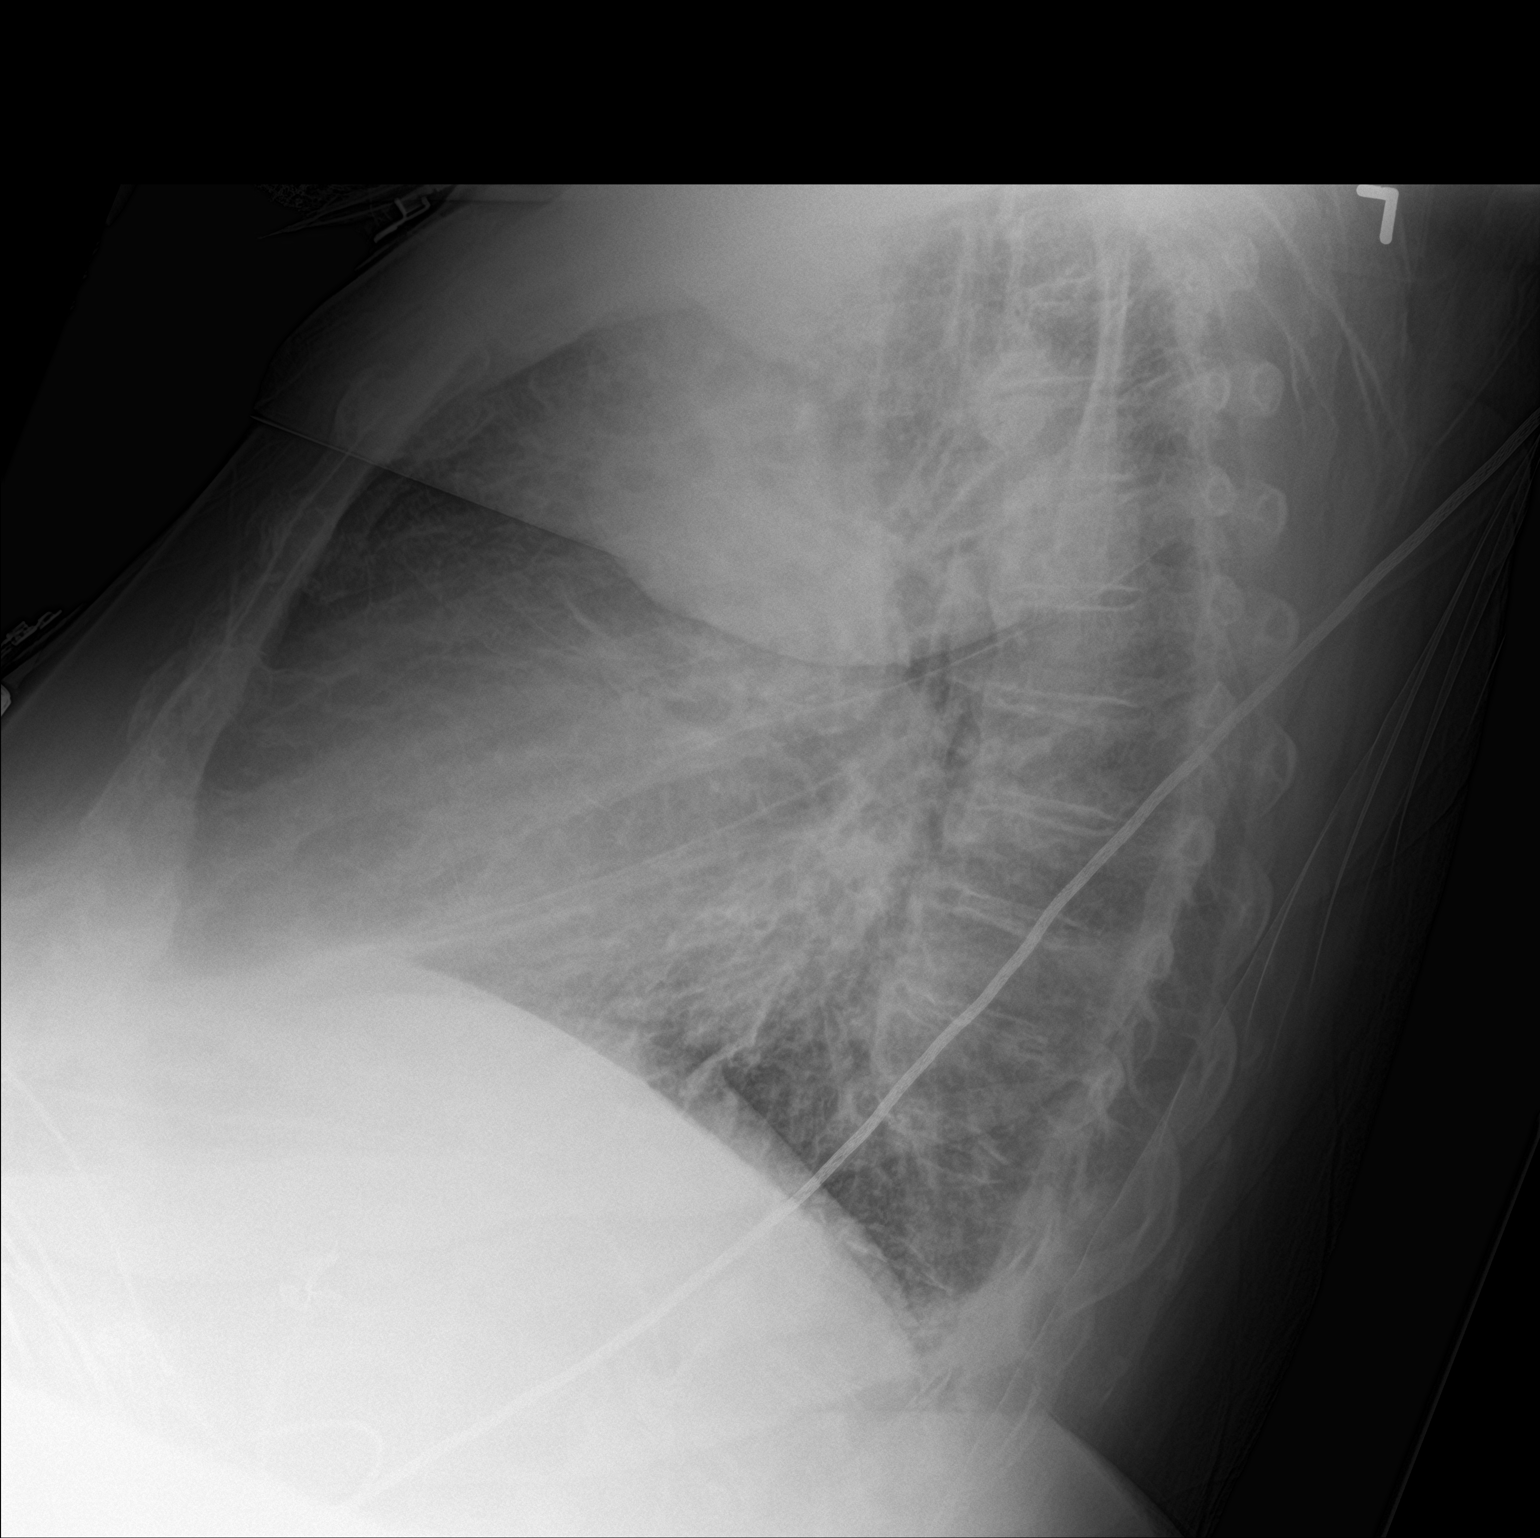

[chest ap]
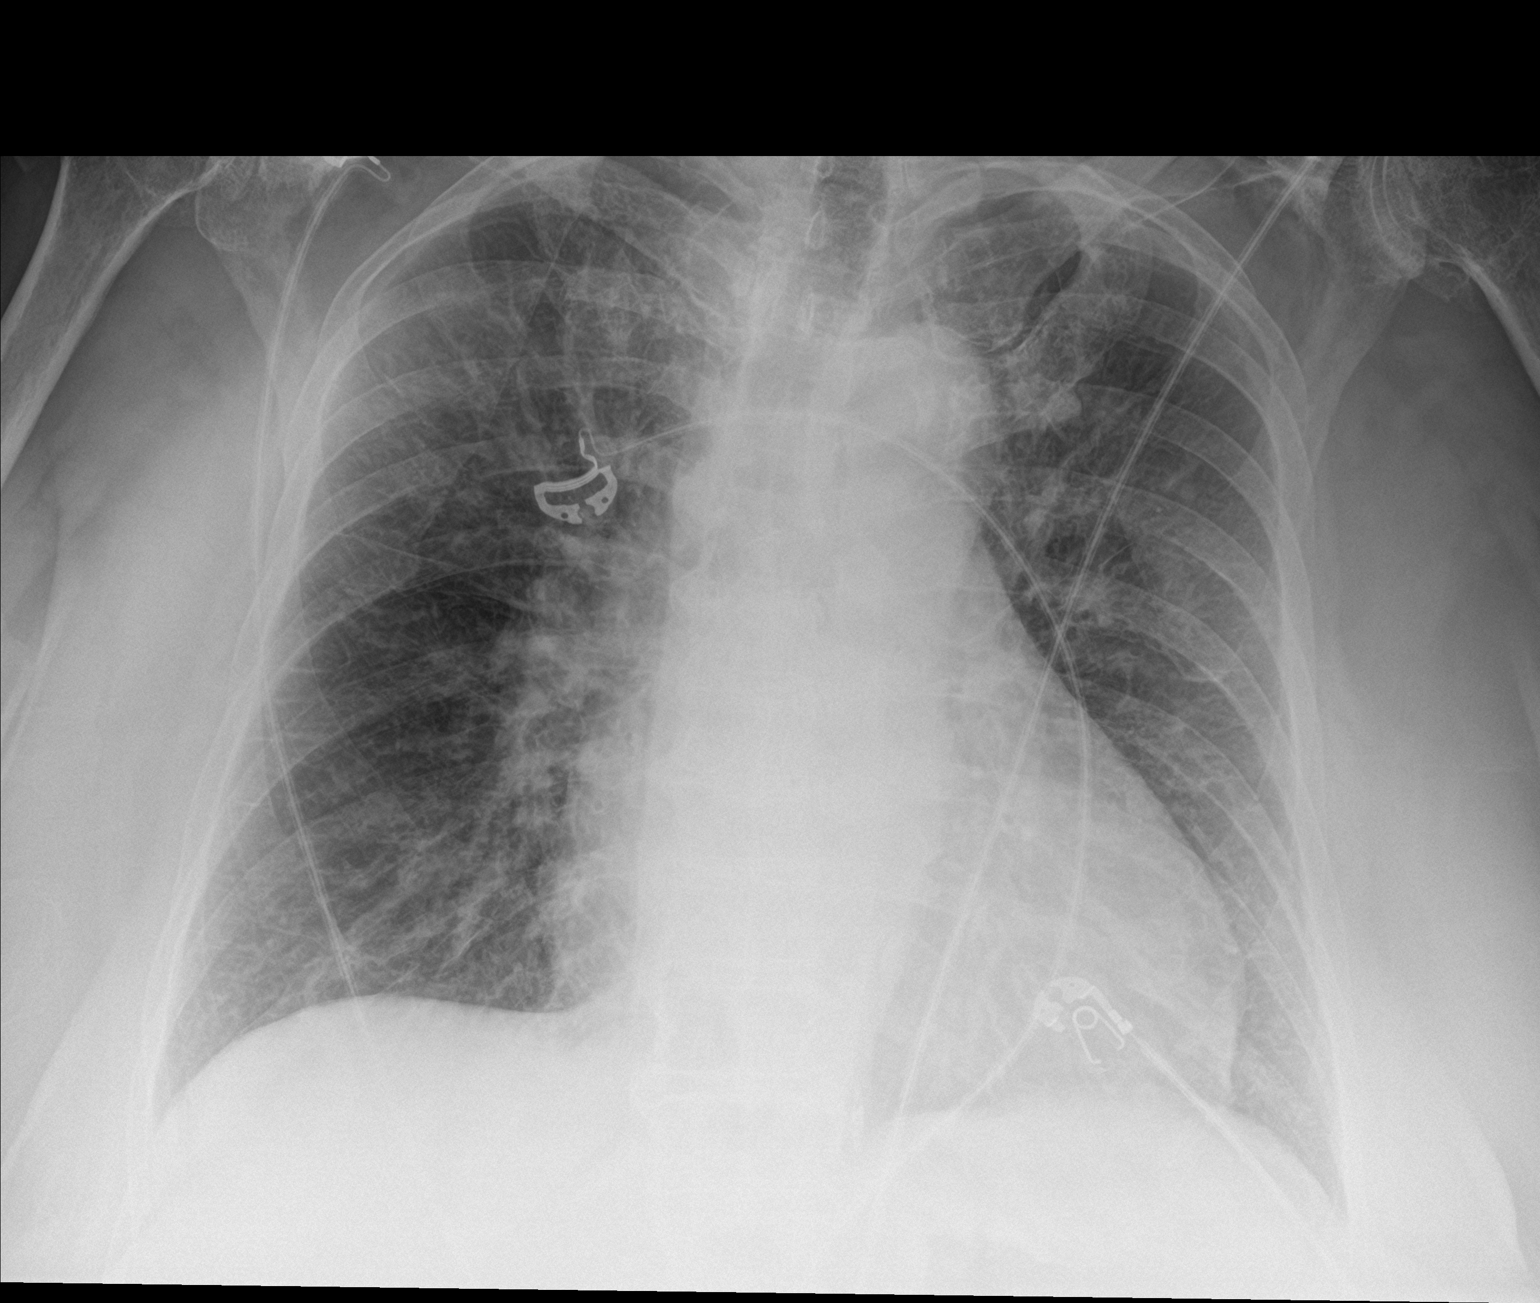

[2 of 2 positions shown; findings below may reference images not displayed]

FINDINGS: There is interstitial pulmonary edema. There is no airspace
consolidation. There is pulmonary venous hypertension with
cardiomegaly. There is no evident adenopathy. There is degenerative
change in each shoulder. There is superior migration of the right
humeral head.
IMPRESSION: Interstitial edema with cardiomegaly and pulmonary venous
hypertension. There is likely a degree of congestive heart failure.
No consolidation. No adenopathy. Degenerative change in each
shoulder with probable chronic rotator cuff tear on the right.

## 2019-07-23 ENCOUNTER — Telehealth (INDEPENDENT_AMBULATORY_CARE_PROVIDER_SITE_OTHER): Payer: Self-pay | Admitting: Gastroenterology

## 2019-07-23 NOTE — Telephone Encounter (Signed)
I called pt for update. She reports her abdominal pain has improved. She has formed bowel movement once a day. She denies any further issues w/ colitis. She has also seen a back surgeon since her last visit-does continue to have pain in side when moving, does not relate to meals. No nausea. No diarrhea or rectal bleeding. She will call w/ any colitis like symptoms in interim.

## 2019-08-07 ENCOUNTER — Ambulatory Visit: Payer: Medicare Other | Admitting: Cardiology

## 2019-08-07 DIAGNOSIS — I110 Hypertensive heart disease with heart failure: Secondary | ICD-10-CM | POA: Diagnosis not present

## 2019-08-07 DIAGNOSIS — I428 Other cardiomyopathies: Secondary | ICD-10-CM | POA: Diagnosis not present

## 2019-08-07 DIAGNOSIS — I482 Chronic atrial fibrillation, unspecified: Secondary | ICD-10-CM | POA: Diagnosis not present

## 2019-08-07 DIAGNOSIS — I5022 Chronic systolic (congestive) heart failure: Secondary | ICD-10-CM | POA: Diagnosis not present

## 2019-08-09 IMAGING — CR DG CHEST 1V PORT
1 series · 1 of 1 positions shown · non-contrast
Comparison: 02/01/2017

CLINICAL DATA: Shortness of Breath, history of heart cath,
hypertension

EXAM:
PORTABLE CHEST 1 VIEW

[portable]
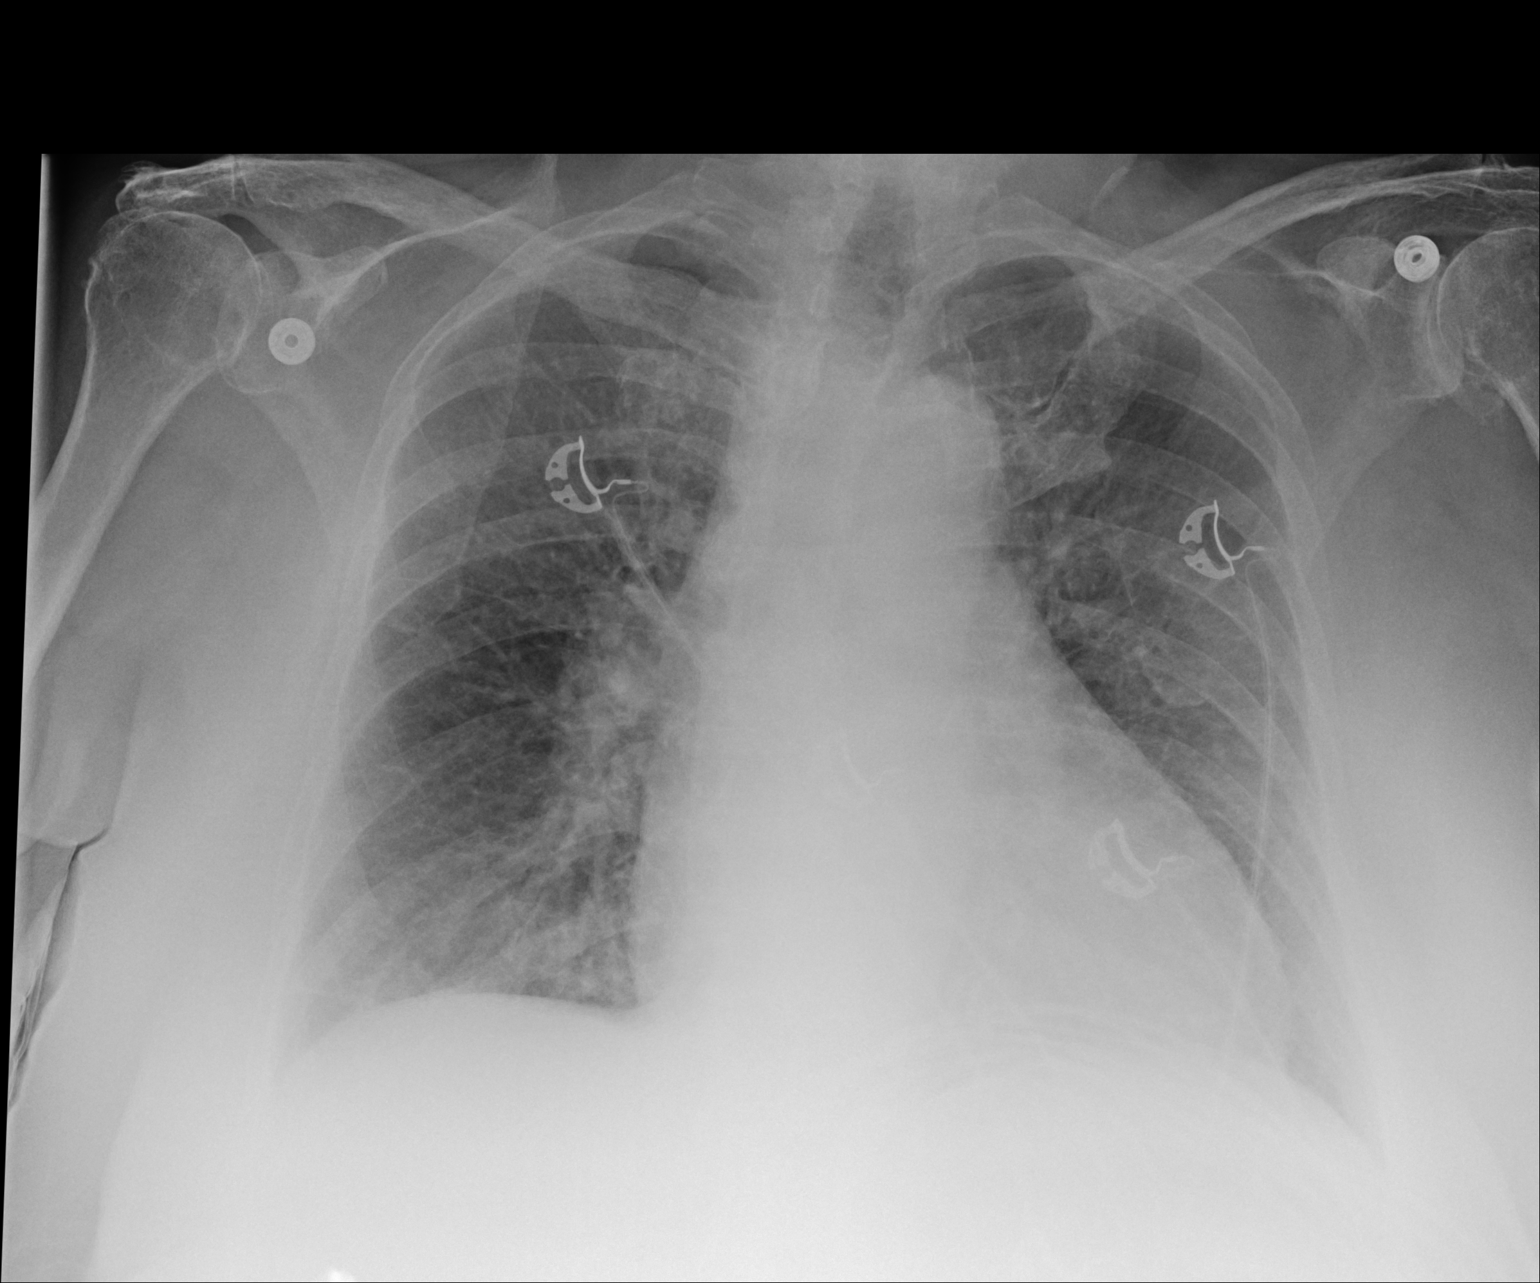

[1 of 1 positions shown; findings below may reference images not displayed]

FINDINGS: Cardiomegaly. Mild peribronchial thickening and interstitial
prominence. No confluent opacities or effusions. No acute bony
abnormality.
IMPRESSION: Cardiomegaly.

Peribronchial thickening and interstitial prominence could reflect
bronchitis or interstitial edema.

## 2019-08-10 ENCOUNTER — Ambulatory Visit: Payer: Medicare Other | Admitting: Cardiology

## 2019-08-10 ENCOUNTER — Other Ambulatory Visit: Payer: Self-pay

## 2019-08-10 ENCOUNTER — Encounter: Payer: Self-pay | Admitting: Cardiology

## 2019-08-10 VITALS — BP 144/74 | HR 52 | Ht 63.0 in | Wt 224.6 lb

## 2019-08-10 DIAGNOSIS — I428 Other cardiomyopathies: Secondary | ICD-10-CM | POA: Diagnosis not present

## 2019-08-10 DIAGNOSIS — I25119 Atherosclerotic heart disease of native coronary artery with unspecified angina pectoris: Secondary | ICD-10-CM | POA: Diagnosis not present

## 2019-08-10 DIAGNOSIS — I4819 Other persistent atrial fibrillation: Secondary | ICD-10-CM

## 2019-08-10 NOTE — Patient Instructions (Addendum)
Medication Instructions:   Your physician recommends that you continue on your current medications as directed. Please refer to the Current Medication list given to you today.  Labwork:  NONE  Testing/Procedures:  NONE  Follow-Up:  Your physician recommends that you schedule a follow-up appointment in: 3 months.  Any Other Special Instructions Will Be Listed Below (If Applicable).  If you need a refill on your cardiac medications before your next appointment, please call your pharmacy. 

## 2019-08-10 NOTE — Progress Notes (Signed)
Cardiology Office Note  Date: 08/10/2019   ID: Kenslee, Achorn Aug 11, 1930, MRN [id number]  PCP:  Rory Percy, MD  Cardiologist:  Rozann Lesches, MD Electrophysiologist:  Constance Haw, MD   Chief Complaint  Patient presents with  . Cardiac follow-up    History of Present Illness: [name] is an 86 y.o. female last seen in April.  I reviewed interval records.  She is here with her daughter for a follow-up visit.  From a cardiac perspective, states that she is doing reasonably well.  No substantial increase in weight, no orthopnea, stable but fluctuating leg edema.  I reviewed her medications which are stable from a cardiac perspective and outlined below.  She does not report any bleeding problems on Xarelto.  I did review her lab work from [facility] back in June.  She has been having some itchy areas on her back, started recently.  In talking with her today it could be due to an inadvertent change in her detergent.  She has otherwise been tolerating her present medications well without difficulty before.  No rash on her trunk or abdomen reported.  She is concerned that she might ultimately need to have a cholecystectomy due to intermittently symptomatic gallbladder disease, also has bladder prolapse and thinks that she may need surgery for this.  She has not seen Dr. Laural Golden as yet, I encouraged her to make a follow-up visit.  She also has follow-up with Dr. Curt Bears in early September.  We have talked about the possibility of CRT-P, this has actually been scheduled and then rescheduled for the last several months.  After discussion today, my feeling is that we should hold off on a procedure at this point and she will review this further with Dr. Curt Bears.  Past Medical History:  Diagnosis Date  . Arthritis   . Atrial flutter ([place])   . Bladder cancer ([place])   . Coronary atherosclerosis of native coronary artery    a. BMS RCA May 2014 - [place] stent. b.  Cath 04/2017 - patent stent, minimal CAD otherwise.  . Depression   . Essential hypertension   . GI bleeding 06/2017   a. melena/small bowel ulcer by capsule endo 06/2017.  Marland Kitchen Hyperlipemia   . Macular degeneration   . NICM (nonischemic cardiomyopathy) ([place])   . Persistent atrial fibrillation ([place])   . Ulcerative colitis     Past Surgical History:  Procedure Laterality Date  . ABDOMINAL HYSTERECTOMY    . APPENDECTOMY    . Bilateral knee replacements      2007, 2008  . BLADDER SURGERY    . CARDIOVERSION N/A 02/04/2017   Procedure: CARDIOVERSION;  Surgeon: Arnoldo Lenis, MD;  [facility];  Service: Endoscopy;  Laterality: N/A;  . CARDIOVERSION N/A 02/26/2017   Procedure: CARDIOVERSION;  Surgeon: Satira Sark, MD;  [facility];  Service: Cardiovascular;  Laterality: N/A;  . CARDIOVERSION N/A 08/07/2017   Procedure: CARDIOVERSION;  Surgeon: Pixie Casino, MD;  Location: [place];  Service: Cardiovascular;  Laterality: N/A;  . COLONOSCOPY N/A 06/15/2015   Procedure: COLONOSCOPY;  Surgeon: Rogene Houston, MD;  [facility];  Service: Endoscopy;  Laterality: N/A;  210  . CYSTOSCOPY W/ RETROGRADES Bilateral 05/14/2016   Procedure: CYSTOSCOPY WITH BILATERAL RETROGRADE PYELOGRAM;  Surgeon: Raynelle Bring, MD;  [facility];  Service: Urology;  Laterality: Bilateral;  GENERAL ANESTHESIA WITH PARALYSIS  . ESOPHAGOGASTRODUODENOSCOPY (EGD) WITH PROPOFOL N/A 06/14/2017   Procedure:  ESOPHAGOGASTRODUODENOSCOPY (EGD) WITH PROPOFOL;  Surgeon: Rogene Houston, MD;  [facility];  Service: Endoscopy;  Laterality: N/A;  . GIVENS CAPSULE STUDY  06/14/2017   Procedure: GIVENS CAPSULE STUDY;  Surgeon: Rogene Houston, MD;  [facility];  Service: Endoscopy;;  . LEFT HEART CATHETERIZATION WITH CORONARY ANGIOGRAM N/A 10/30/2013   Procedure: LEFT HEART CATHETERIZATION WITH CORONARY ANGIOGRAM;  Surgeon: Burnell Blanks, MD;  Location: [facility] CATH  LAB;  Service: Cardiovascular;  Laterality: N/A;  . RIGHT/LEFT HEART CATH AND CORONARY ANGIOGRAPHY N/A 05/03/2017   Procedure: RIGHT/LEFT HEART CATH AND CORONARY ANGIOGRAPHY;  Surgeon: Leonie Man, MD;  Location: [place] CV LAB;  Service: Cardiovascular;  Laterality: N/A;  . TEE WITHOUT CARDIOVERSION N/A 02/04/2017   Procedure: TRANSESOPHAGEAL ECHOCARDIOGRAM (TEE) WITH PROPOL;  Surgeon: Arnoldo Lenis, MD;  [facility];  Service: Endoscopy;  Laterality: N/A;  . TONSILLECTOMY    . TOTAL KNEE ARTHROPLASTY    . TRANSURETHRAL RESECTION OF BLADDER TUMOR N/A 05/14/2016   Procedure: TRANSURETHRAL RESECTION OF BLADDER TUMOR (TURBT);  Surgeon: Raynelle Bring, MD;  [facility];  Service: Urology;  Laterality: N/A;  GENERAL ANESTHESIA WITH PARALYSIS  . YAG LASER APPLICATION Bilateral 82/07/784   Procedure: YAG LASER APPLICATION;  Surgeon: Williams Che, MD;  [facility];  Service: Ophthalmology;  Laterality: Bilateral;    Current Outpatient Medications  Medication Sig Dispense Refill  . acetaminophen (TYLENOL) 500 MG tablet Take 1,000 mg by mouth 3 (three) times daily with meals.     Marland Kitchen atorvastatin (LIPITOR) 20 MG tablet Take 1 tablet (20 mg total) by mouth at bedtime. 90 tablet 3  . balsalazide (COLAZAL) 750 MG capsule TAKE THREE CAPSULES BY MOUTH THREE TIMES DAILY (Patient taking differently: Take 2,250 mg by mouth 3 (three) times daily. ) 270 capsule 5  . Cholecalciferol (VITAMIN D-3) 25 MCG (1000 UT) CAPS Take 2,000 Units by mouth daily.     . Cranberry 1000 MG CAPS Take 4,200 mg by mouth daily. With vit C    . dicyclomine (BENTYL) 10 MG capsule Take 1 capsule (10 mg total) by mouth 2 (two) times daily as needed for spasms (abd pain). 30 capsule 0  . dofetilide (TIKOSYN) 250 MCG capsule Take 1 capsule (250 mcg total) by mouth 2 (two) times daily. 60 capsule 11  . ENTRESTO 24-26 MG TAKE 1 TABLET BY MOUTH TWICE DAILY - start ENTRESTO 48 HOURS AFTER STOPPING LISINOPRIL  (Patient taking differently: Take 1 tablet by mouth 2 (two) times daily. ) 60 tablet 6  . furosemide (LASIX) 20 MG tablet TAKE TWO TABLETS BY MOUTH TWICE DAILY 360 tablet 1  . Multiple Vitamins-Minerals (ICAPS AREDS 2 PO) Take 2 tablets by mouth daily at 12 noon.     . nitroGLYCERIN (NITROSTAT) 0.4 MG SL tablet Place 0.4 mg under the tongue every 5 (five) minutes as needed for chest pain.    . potassium chloride SA (KLOR-CON) 20 MEQ tablet TAKE 1 TABLET BY MOUTH THREE TIMES DAILY (Patient taking differently: Take 20 mEq by mouth 3 (three) times daily. ) 90 tablet 6  . spironolactone (ALDACTONE) 25 MG tablet Take 0.5 tablets (12.5 mg total) by mouth daily for 90 doses. 45 tablet 3  . tetrahydrozoline (EYE DROPS) 0.05 % ophthalmic solution Place 1 drop into both eyes daily as needed (Eye Burning). Thera tears    . XARELTO 20 MG TABS tablet TAKE 1 TABLET BY MOUTH DAILY WITH SUPPER (Patient taking differently: Take 20  mg by mouth daily with supper. High risk med: Anticoagulant) 30 tablet 6   No current facility-administered medications for this visit.   Allergies:  Sinus & allergy [chlorpheniramine-phenylephrine], Carvedilol, Demerol, and Penicillins   ROS:   No palpitations or syncope.  Physical Exam: VS:  BP (!) 144/74   Pulse 52   Ht 5' 3"  (1.6 m)   Wt (!) 224 lb 9.6 oz (101.9 kg)   SpO2 96%   BMI 39.79 kg/m , BMI Body mass index is 39.79 kg/m.  Wt Readings from Last 3 Encounters:  08/10/19 (!) 224 lb 9.6 oz (101.9 kg)  06/11/19 222 lb 8 oz (100.9 kg)  06/08/19 223 lb (101.2 kg)    General: Elderly woman, appears comfortable at rest. HEENT: Conjunctiva and lids normal, wearing a mask. Neck: Supple, no elevated JVP or carotid bruits, no thyromegaly. Lungs: Clear to auscultation, nonlabored breathing at rest. Cardiac: Regular rate and rhythm, no S3 or significant systolic murmur. Extremities: Stable ankle edema bilaterally, distal pulses 2+.  ECG:  An ECG dated 04/14/2019 was  personally reviewed today and demonstrated:  Sinus rhythm with left bundle branch block.  Recent Labwork: 08/26/2018: Magnesium 2.3 06/11/2019: ALT 13; AST 18; BUN 17; Creat 1.05; Hemoglobin 13.7; Platelets 284; Potassium 4.8; Sodium 135  June 2021: Potassium 4.9, BUN 15, creatinine 0.72, GI pathogen panel negative, lipase 103, hemoglobin 13.3, platelets 254  Other Studies Reviewed Today:  Echocardiogram 03/25/2019: 1. Endocardium poorly visualized, difficult to assess LV function.  Recommend limited contrast study to better evaluate LVEF, 30-35% is rough  estimate. . Left ventricular ejection fraction, by estimation, is 30-35%.  The left ventricle has moderate to  severely decreased function. The left ventricle demonstrates global  hypokinesis. Left ventricular diastolic parameters are consistent with  Grade I diastolic dysfunction (impaired relaxation).  2. Right ventricular systolic function is normal. The right ventricular  size is normal. There is normal pulmonary artery systolic pressure.  3. Left atrial size was severely dilated.  4. Right atrial size was mildly dilated.  5. The mitral valve is normal in structure. No evidence of mitral valve  regurgitation. No evidence of mitral stenosis.  6. The aortic valve is tricuspid. Aortic valve regurgitation is not  visualized. No aortic stenosis is present.  7. The inferior vena cava is normal in size with greater than 50%  respiratory variability, suggesting right atrial pressure of 3 mmHg.  Assessment and Plan:  1.  Nonischemic cardiomyopathy with LVEF 30 to 35% by most recent assessment.  She is clinically stable with NYHA class II dyspnea with typical ADLs.  Left bundle branch block present by ECG, we have had discussions about CRT-P and she has seen Dr. Curt Bears, although per discussion today and considering the entire situation, would likely hold off on intervention and continue with medical therapy.  She plans to see Dr.  Curt Bears in the office in September and can discuss this further.  Continue Entresto, Aldactone, Lasix, and potassium supplements.  2.  History of persistent atrial fibrillation.  She remains on Tikosyn and Xarelto.  No reported spontaneous bleeding problems.  I reviewed her lab work from June.  3.  CAD status post BMS to the RCA in 2014.  She does not report any obvious angina.  She is not on aspirin at this time given use of Xarelto.  Continue Lipitor.  Medication Adjustments/Labs and Tests Ordered: Current medicines are reviewed at length with the patient today.  Concerns regarding medicines are outlined above.   Tests  Ordered: No orders of the defined types were placed in this encounter.   Medication Changes: No orders of the defined types were placed in this encounter.   Disposition:  Follow up 3 months in the [place] office.  Signed, Satira Sark, MD, [facility] 08/10/2019 11:37 AM    [place] at [place], [place],  [address] Phone: [phone]; Fax: [phone]

## 2019-08-26 ENCOUNTER — Ambulatory Visit (INDEPENDENT_AMBULATORY_CARE_PROVIDER_SITE_OTHER): Payer: Medicare Other | Admitting: Gastroenterology

## 2019-09-08 DIAGNOSIS — H353131 Nonexudative age-related macular degeneration, bilateral, early dry stage: Secondary | ICD-10-CM | POA: Diagnosis not present

## 2019-09-08 DIAGNOSIS — I5022 Chronic systolic (congestive) heart failure: Secondary | ICD-10-CM | POA: Diagnosis not present

## 2019-09-08 DIAGNOSIS — H524 Presbyopia: Secondary | ICD-10-CM | POA: Diagnosis not present

## 2019-09-08 DIAGNOSIS — D3131 Benign neoplasm of right choroid: Secondary | ICD-10-CM | POA: Diagnosis not present

## 2019-09-08 DIAGNOSIS — I482 Chronic atrial fibrillation, unspecified: Secondary | ICD-10-CM | POA: Diagnosis not present

## 2019-09-08 DIAGNOSIS — I428 Other cardiomyopathies: Secondary | ICD-10-CM | POA: Diagnosis not present

## 2019-09-08 DIAGNOSIS — I110 Hypertensive heart disease with heart failure: Secondary | ICD-10-CM | POA: Diagnosis not present

## 2019-09-08 DIAGNOSIS — H18503 Unspecified hereditary corneal dystrophies, bilateral: Secondary | ICD-10-CM | POA: Diagnosis not present

## 2019-09-10 ENCOUNTER — Encounter: Payer: Self-pay | Admitting: Cardiology

## 2019-09-10 ENCOUNTER — Ambulatory Visit: Payer: Medicare Other | Admitting: Cardiology

## 2019-09-10 ENCOUNTER — Other Ambulatory Visit: Payer: Self-pay

## 2019-09-10 VITALS — BP 127/72 | HR 72 | Ht 63.0 in | Wt 227.4 lb

## 2019-09-10 DIAGNOSIS — I428 Other cardiomyopathies: Secondary | ICD-10-CM | POA: Diagnosis not present

## 2019-09-10 NOTE — Progress Notes (Signed)
Electrophysiology Office Note   Date:  09/10/2019   ID:  [name], [name] 1930-12-28, MRN [id number]  PCP:  Rory Percy, MD  Cardiologist:  Domenic Polite Primary Electrophysiologist:  Rihanna Marseille Meredith Leeds, MD    No chief complaint on file.    History of Present Illness: [name] is a 86 y.o. female who is being seen today for the evaluation of atrial fibrillation at the request of Rory Percy, MD. Presenting today for electrophysiology evaluation.  She has a history of atrial flutter, coronary artery disease status post RCA stent in 2014, paroxysmal atrial fibrillation, and nonischemic cardiomyopathy.  She does have atrial fibrillation and is failed cardioversion and amiodarone.  Most recent echo showed an EF of 25% with catheterization showing a likely nonischemic cardiomyopathy.  She has weakness in class III dyspnea.  Today, denies symptoms of palpitations, chest pain, orthopnea, PND, lower extremity edema, claudication, dizziness, presyncope, syncope, bleeding, or neurologic sequela. The patient is tolerating medications without difficulties.  She continues to have issues of shortness of breath and fatigue.  These have been occurring over the last year.  She has had a difficult last year.  She is found out that she has bladder prolapse and has an appointment with a urologist upcoming.  She also has had some right-sided upper quadrant pain and is trying to figure out whether or not it is due to her gallstones.  Throughout all this, she has continued to have weakness and fatigue as well as shortness of breath that she attributes to her overall cardiac function.  Past Medical History:  Diagnosis Date   Arthritis    Atrial flutter [facility])    Bladder cancer ([place])    Coronary atherosclerosis of native coronary artery    a. BMS RCA May 2014 - [place] stent. b. Cath 04/2017 - patent stent, minimal CAD otherwise.   Depression    Essential hypertension    GI bleeding 06/2017     a. melena/small bowel ulcer by capsule endo 06/2017.   Hyperlipemia    Macular degeneration    NICM (nonischemic cardiomyopathy) ([place])    Persistent atrial fibrillation ([facility])    Ulcerative colitis    Past Surgical History:  Procedure Laterality Date   ABDOMINAL HYSTERECTOMY     APPENDECTOMY     Bilateral knee replacements      2007, 2008   BLADDER SURGERY     CARDIOVERSION N/A 02/04/2017   Procedure: CARDIOVERSION;  Surgeon: Arnoldo Lenis, MD;  [facility];  Service: Endoscopy;  Laterality: N/A;   CARDIOVERSION N/A 02/26/2017   Procedure: CARDIOVERSION;  Surgeon: Satira Sark, MD;  [facility];  Service: Cardiovascular;  Laterality: N/A;   CARDIOVERSION N/A 08/07/2017   Procedure: CARDIOVERSION;  Surgeon: Pixie Casino, MD;  Location: [place];  Service: Cardiovascular;  Laterality: N/A;   COLONOSCOPY N/A 06/15/2015   Procedure: COLONOSCOPY;  Surgeon: Rogene Houston, MD;  [facility];  Service: Endoscopy;  Laterality: N/A;  210   CYSTOSCOPY W/ RETROGRADES Bilateral 05/14/2016   Procedure: CYSTOSCOPY WITH BILATERAL RETROGRADE PYELOGRAM;  Surgeon: Raynelle Bring, MD;  [facility];  Service: Urology;  Laterality: Bilateral;  GENERAL ANESTHESIA WITH PARALYSIS   ESOPHAGOGASTRODUODENOSCOPY (EGD) WITH PROPOFOL N/A 06/14/2017   Procedure: ESOPHAGOGASTRODUODENOSCOPY (EGD) WITH PROPOFOL;  Surgeon: Rogene Houston, MD;  [facility];  Service: Endoscopy;  Laterality: N/A;   GIVENS CAPSULE STUDY  06/14/2017   Procedure: GIVENS CAPSULE STUDY;  Surgeon: Rogene Houston,  MD;  [facility];  Service: Endoscopy;;   LEFT HEART CATHETERIZATION WITH CORONARY ANGIOGRAM N/A 10/30/2013   Procedure: LEFT HEART CATHETERIZATION WITH CORONARY ANGIOGRAM;  Surgeon: Burnell Blanks, MD;  Location: [facility] CATH LAB;  Service: Cardiovascular;  Laterality: N/A;   RIGHT/LEFT HEART CATH AND CORONARY ANGIOGRAPHY N/A 05/03/2017    Procedure: RIGHT/LEFT HEART CATH AND CORONARY ANGIOGRAPHY;  Surgeon: Leonie Man, MD;  Location: [place] CV LAB;  Service: Cardiovascular;  Laterality: N/A;   TEE WITHOUT CARDIOVERSION N/A 02/04/2017   Procedure: TRANSESOPHAGEAL ECHOCARDIOGRAM (TEE) WITH PROPOL;  Surgeon: Arnoldo Lenis, MD;  [facility];  Service: Endoscopy;  Laterality: N/A;   TONSILLECTOMY     TOTAL KNEE ARTHROPLASTY     TRANSURETHRAL RESECTION OF BLADDER TUMOR N/A 05/14/2016   Procedure: TRANSURETHRAL RESECTION OF BLADDER TUMOR (TURBT);  Surgeon: Raynelle Bring, MD;  [facility];  Service: Urology;  Laterality: N/A;  GENERAL ANESTHESIA WITH PARALYSIS   YAG LASER APPLICATION Bilateral 16/01/958   Procedure: YAG LASER APPLICATION;  Surgeon: Williams Che, MD;  [facility];  Service: Ophthalmology;  Laterality: Bilateral;     Current Outpatient Medications  Medication Sig Dispense Refill   acetaminophen (TYLENOL) 500 MG tablet Take 1,000 mg by mouth 3 (three) times daily with meals.      atorvastatin (LIPITOR) 20 MG tablet Take 1 tablet (20 mg total) by mouth at bedtime. 90 tablet 3   balsalazide (COLAZAL) 750 MG capsule TAKE THREE CAPSULES BY MOUTH THREE TIMES DAILY (Patient taking differently: Take 2,250 mg by mouth 3 (three) times daily. ) 270 capsule 5   Cholecalciferol (VITAMIN D-3) 25 MCG (1000 UT) CAPS Take 2,000 Units by mouth daily.      Cranberry 1000 MG CAPS Take 4,200 mg by mouth daily. With vit C     dicyclomine (BENTYL) 10 MG capsule Take 1 capsule (10 mg total) by mouth 2 (two) times daily as needed for spasms (abd pain). 30 capsule 0   dofetilide (TIKOSYN) 250 MCG capsule Take 1 capsule (250 mcg total) by mouth 2 (two) times daily. 60 capsule 11   ENTRESTO 24-26 MG TAKE 1 TABLET BY MOUTH TWICE DAILY - start ENTRESTO 48 HOURS AFTER STOPPING LISINOPRIL (Patient taking differently: Take 1 tablet by mouth 2 (two) times daily. ) 60 tablet 6   furosemide (LASIX) 20 MG  tablet TAKE TWO TABLETS BY MOUTH TWICE DAILY 360 tablet 1   Multiple Vitamins-Minerals (ICAPS AREDS 2 PO) Take 2 tablets by mouth daily at 12 noon.      nitroGLYCERIN (NITROSTAT) 0.4 MG SL tablet Place 0.4 mg under the tongue every 5 (five) minutes as needed for chest pain.     potassium chloride SA (KLOR-CON) 20 MEQ tablet TAKE 1 TABLET BY MOUTH THREE TIMES DAILY (Patient taking differently: Take 20 mEq by mouth 3 (three) times daily. ) 90 tablet 6   sodium chloride (MURO 128) 2 % ophthalmic solution Place 1 drop into both eyes every 4 (four) hours as needed for eye irritation.     spironolactone (ALDACTONE) 25 MG tablet Take 0.5 tablets (12.5 mg total) by mouth daily for 90 doses. 45 tablet 3   tetrahydrozoline (EYE DROPS) 0.05 % ophthalmic solution Place 1 drop into both eyes daily as needed (Eye Burning). Thera tears     XARELTO 20 MG TABS tablet TAKE 1 TABLET BY MOUTH DAILY WITH SUPPER (Patient taking differently: Take 20 mg by mouth daily with supper. High risk med: Anticoagulant) 30  tablet 6   No current facility-administered medications for this visit.    Allergies:   Sinus & allergy [chlorpheniramine-phenylephrine], Carvedilol, Demerol, and Penicillins   Social History:  The patient  reports that she has never smoked. She has never used smokeless tobacco. She reports that she does not drink alcohol and does not use drugs.   Family History:  The patient's family history includes CAD in her father.   ROS:  Please see the history of present illness.   Otherwise, review of systems is positive for none.   All other systems are reviewed and negative.   PHYSICAL EXAM: VS:  BP 127/72    Pulse 72    Ht 5' 3"  (1.6 m)    Wt 227 lb 6.4 oz (103.1 kg)    SpO2 97%    BMI 40.28 kg/m  , BMI Body mass index is 40.28 kg/m. GEN: Well nourished, well developed, in no acute distress  HEENT: normal  Neck: no JVD, carotid bruits, or masses Cardiac: RRR; no murmurs, rubs, or gallops,no edema    Respiratory:  clear to auscultation bilaterally, normal work of breathing GI: soft, nontender, nondistended, + BS MS: no deformity or atrophy  Skin: warm and dry Neuro:  Strength and sensation are intact Psych: euthymic mood, full affect  EKG:  EKG is ordered today. Personal review of the ekg ordered shows sinus rhythm, PACs and PVCs  Recent Labs: 06/11/2019: ALT 13; BUN 17; Creat 1.05; Hemoglobin 13.7; Platelets 284; Potassium 4.8; Sodium 135    Lipid Panel  No results found for: CHOL, TRIG, HDL, CHOLHDL, VLDL, LDLCALC, LDLDIRECT   Wt Readings from Last 3 Encounters:  09/10/19 227 lb 6.4 oz (103.1 kg)  08/10/19 (!) 224 lb 9.6 oz (101.9 kg)  06/11/19 222 lb 8 oz (100.9 kg)      Other studies Reviewed: Additional studies/ records that were reviewed today include: TTE 11/06/18 Review of the above records today demonstrates:   1. Left ventricular ejection fraction, by visual estimation, is 20 to 25%. The left ventricle has severely decreased function. There is mildly increased left ventricular hypertrophy. There is diffuse hypokinesis.  2. Abnormal septal motion consistent with left bundle branch block.  3. Left ventricular diastolic parameters are consistent with Grade I diastolic dysfunction (impaired relaxation).  4. Global right ventricle has normal systolic function.The right ventricular size is normal. No increase in right ventricular wall thickness.  5. Left atrial size was normal.  6. Right atrial size was normal.  7. Mild to moderate mitral annular calcification.  8. Moderate aortic valve annular calcification.  9. The mitral valve is grossly normal. Mild mitral valve regurgitation. 10. The tricuspid valve is grossly normal. Tricuspid valve regurgitation is trivial. 11. The aortic valve is tricuspid. Aortic valve regurgitation is not visualized. 12. The pulmonic valve was grossly normal. Pulmonic valve regurgitation is mild. 13. The inferior vena cava is normal in size  with greater than 50% respiratory variability, suggesting right atrial pressure of 3 mmHg. 14. TR signal is inadequate for assessing pulmonary artery systolic pressure.  [facility] 05/03/17  Hemodynamic findings consistent with mild secondary pulmonary hypertension.  Patient has severe nonischemic cardiomyopathy  Moderate to Severely reduced CO/CI  LV end diastolic pressure is moderately elevated.  Mid RCA stent widely patent  There is mild (2+) mitral regurgitation.  [place] 09/16/18 personally reviewed Max 197 bpm 09:12am, 08/24 Min 38 bpm 03:09am, 08/24 Avg 68 bpm Rare PVCs Frequent PACs Atrial fibrillation: None Multiple runs of SVT, longest 11  seconds, fastest rate 197 for 8 beats  ASSESSMENT AND PLAN:  1.  Persistent atrial fibrillation: Currently on Xarelto and dofetilide with a CHA2DS2-VASc of 4.  Remains in sinus rhythm.  2.  Nonischemic cardiomyopathy: Ejection fraction remains low at 30 to 35%.  She has an IVCD which is more of a left bundle branch block morphology.  She is currently on optimal medical therapy.  She has symptoms of shortness of breath and fatigue.  She also has a wide left bundle branch block.  She would likely benefit from CRT implant.  Risks and benefits were discussed include bleeding, tamponade, infection, pneumothorax, lead dislodgment, inability to place the LV lead.  She understands these risks and has agreed to the procedure.  3.  Coronary artery disease: Status post RCA stent in 2014.  No further chest pain.   Current medicines are reviewed at length with the patient today.   The patient does not have concerns regarding her medicines.  The following changes were made today: None  Labs/ tests ordered today include:  Orders Placed This Encounter  Procedures   EKG 12-Lead    Disposition:   FU with Zierra Laroque 2 months  Signed, Ambria Mayfield Meredith Leeds, MD  09/10/2019 2:58 PM     [place] [address] [place]  [address] [phone] (office) [phone] (fax)

## 2019-09-10 NOTE — Patient Instructions (Signed)
Implantable Device Instructions  You are scheduled for: BiVentricular Pacemaker on 11/13/2019 with Dr. Curt Bears.  1.   Pre procedure testing-             A.  LAB WORK--- On ____________. You do NOT need to be fasting              B. COVID TEST-- On __________ @ ____________ - This is a Drive Up Visit at [facility] in your car and someone will be with you shortly.   After you are tested please go home and self quarantine until the day of your procedure.    2. On the day of your procedure 11/13/2019 you will go to [facility] [phone]) at 5:30 am.  Dennis Bast will go to the main entrance [address] [employer]) and enter where the [employer] are.  You will check in at ADMITTING.  You may have one support person come in to the hospital with you.  They will be asked to wait in the waiting room.   3.   Do not eat or drink after midnight prior to your procedure.   4.   On the morning of your procedure do NOT take any medication.  5.  The night before your procedure and the morning of your procedure scrub your neck/chest with surgical scrub.  An instruction letter is included below.   5.  Plan for an overnight stay.  If you use your phone frequently bring your phone charger.  When you are discharged you will need someone to drive you home.   6.  You will follow up with the [facility] clinic 10-14 days after your procedure. You will follow up with Dr. Curt Bears 91 days after your procedure.  These appointments will be made for you.   * If you have ANY questions after you get home, please call the office [phone] and ask for Laurent Cargile RN or send a MyChart message.   [facility] - Preparing For Surgery  Before surgery, you can play an important role. Because skin is not sterile, your skin needs to be as free of germs as possible. You can reduce the number of germs on your skin by washing with CHG (chlorahexidine gluconate) Soap before surgery.  CHG is an antiseptic cleaner  which kills germs and bonds with the skin to continue killing germs even after washing.   Please do not use if you have an allergy to CHG or antibacterial soaps.  If your skin becomes reddened/irritated stop using the CHG.   Do not shave (including legs and underarms) for at least 48 hours prior to first CHG shower.  It is OK to shave your face.  Please follow these instructions carefully:  1.  Shower the night before surgery and the morning of surgery with CHG.  2.  If you choose to wash your hair, wash your hair first as usual with your normal shampoo.  3.  After you shampoo, rinse your hair and body thoroughly to remove the shampoo.  4.  Use CHG as you would any other liquid soap.  You can apply CHG directly to the skin and wash gently with a clean washcloth. 5.  Apply the CHG Soap to your body ONLY FROM THE NECK DOWN.  Do not use on open wounds or open sores.  Avoid contact with your eyes, ears, mouth and genitals (private parts).  Wash genitals (private parts) with your normal soap.  6.  Wash thoroughly, paying special attention to the area where your surgery will be performed.  7.  Thoroughly rinse your body with warm water from the neck down.   8.  DO NOT shower/wash with your normal soap after using and rinsing off the CHG soap.  9.  Pat yourself dry with a clean towel.           10.  Wear clean pajamas.           11.  Place clean sheets on your bed the night of your first shower and do not sleep with pets.  Day of Surgery: Do not apply any deodorants/lotions.  Please wear clean clothes to the hospital/surgery center.    Pacemaker Implantation, Adult Pacemaker implantation is a procedure to place a pacemaker inside your chest. A pacemaker is a small computer that sends electrical signals to the heart and helps your heart beat normally. A pacemaker also stores information about your heart rhythms. You may need pacemaker implantation if you:  Have a slow heartbeat  (bradycardia).  Faint (syncope).  Have shortness of breath (dyspnea) due to heart problems.  The pacemaker attaches to your heart through a wire, called a lead. Sometimes just one lead is needed. Other times, there will be two leads. There are two types of pacemakers:  Transvenous pacemaker. This type is placed under the skin or muscle of your chest. The lead goes through a vein in the chest area to reach the inside of the heart.  Epicardial pacemaker. This type is placed under the skin or muscle of your chest or belly. The lead goes through your chest to the outside of the heart.  Tell a health care provider about:  Any allergies you have.  All medicines you are taking, including vitamins, herbs, eye drops, creams, and over-the-counter medicines.  Any problems you or family members have had with anesthetic medicines.  Any blood or bone disorders you have.  Any surgeries you have had.  Any medical conditions you have.  Whether you are pregnant or may be pregnant. What are the risks? Generally, this is a safe procedure. However, problems may occur, including:  Infection.  Bleeding.  Failure of the pacemaker or the lead.  Collapse of a lung or bleeding into a lung.  Blood clot inside a blood vessel with a lead.  Damage to the heart.  Infection inside the heart (endocarditis).  Allergic reactions to medicines.  What happens before the procedure? Staying hydrated Follow instructions from your health care provider about hydration, which may include:  Up to 2 hours before the procedure - you may continue to drink clear liquids, such as water, clear fruit juice, black coffee, and plain tea.  Eating and drinking restrictions Follow instructions from your health care provider about eating and drinking, which may include:  8 hours before the procedure - stop eating heavy meals or foods such as meat, fried foods, or fatty foods.  6 hours before the procedure - stop  eating light meals or foods, such as toast or cereal.  6 hours before the procedure - stop drinking milk or drinks that contain milk.  2 hours before the procedure - stop drinking clear liquids.  Medicines  Ask your health care provider about: ? Changing or stopping your regular medicines. This is especially important if you are taking diabetes medicines or blood thinners. ? Taking medicines such as aspirin and ibuprofen. These medicines can thin your blood. Do not take these medicines before  your procedure if your health care provider instructs you not to.  You may be given antibiotic medicine to help prevent infection. General instructions  You will have a heart evaluation. This may include an electrocardiogram (ECG), chest X-ray, and heart imaging (echocardiogram,  or echo) tests.  You will have blood tests.  Do not use any products that contain nicotine or tobacco, such as cigarettes and e-cigarettes. If you need help quitting, ask your health care provider.  Plan to have someone take you home from the hospital or clinic.  If you will be going home right after the procedure, plan to have someone with you for 24 hours.  Ask your health care provider how your surgical site will be marked or identified. What happens during the procedure?  To reduce your risk of infection: ? Your health care team will wash or sanitize their hands. ? Your skin will be washed with soap. ? Hair may be removed from the surgical area.  An IV tube will be inserted into one of your veins.  You will be given one or more of the following: ? A medicine to help you relax (sedative). ? A medicine to numb the area (local anesthetic). ? A medicine to make you fall asleep (general anesthetic).  If you are getting a transvenous pacemaker: ? An incision will be made in your upper chest. ? A pocket will be made for the pacemaker. It may be placed under the skin or between layers of muscle. ? The lead will be  inserted into a blood vessel that returns to the heart. ? While X-rays are taken by an imaging machine (fluoroscopy), the lead will be advanced through the vein to the inside of your heart. ? The other end of the lead will be tunneled under the skin and attached to the pacemaker.  If you are getting an epicardial pacemaker: ? An incision will be made near your ribs or breastbone (sternum) for the lead. ? The lead will be attached to the outside of your heart. ? Another incision will be made in your chest or upper belly to create a pocket for the pacemaker. ? The free end of the lead will be tunneled under the skin and attached to the pacemaker.  The transvenous or epicardial pacemaker will be tested. Imaging studies may be done to check the lead position.  The incisions will be closed with stitches (sutures), adhesive strips, or skin glue.  Bandages (dressing) will be placed over the incisions. The procedure may vary among health care providers and hospitals. What happens after the procedure?  Your blood pressure, heart rate, breathing rate, and blood oxygen level will be monitored until the medicines you were given have worn off.  You will be given antibiotics and pain medicine.  ECG and chest x-rays will be done.  You will wear a continuous type of ECG (Holter monitor) to check your heart rhythm.  Your health care provider will program the pacemaker.  Do not drive for 24 hours if you received a sedative. This information is not intended to replace advice given to you by your health care provider. Make sure you discuss any questions you have with your health care provider. Document Released: 12/15/2001 Document Revised: 07/15/2015 Document Reviewed: 06/08/2015 Elsevier Interactive Patient Education  2018 [employer].     Pacemaker Implantation, Adult, Care After This sheet gives you information about how to care for yourself after your procedure. Your health care provider may  also give you more  specific instructions. If you have problems or questions, contact your health care provider. What can I expect after the procedure? After the procedure, it is common to have:  Mild pain.  Slight bruising.  Some swelling over the incision.  A slight bump over the skin where the device was placed. Sometimes, it is possible to feel the device under the skin. This is normal.  Follow these instructions at home: Medicines  Take over-the-counter and prescription medicines only as told by your health care provider.  If you were prescribed an antibiotic medicine, take it as told by your health care provider. Do not stop taking the antibiotic even if you start to feel better. Wound care  Do not remove the bandage on your chest until directed to do so by your health care provider.  After your bandage is removed, you may see pieces of tape called skin adhesive strips over the area where the cut was made (incision site). Let them fall off on their own.  Check the incision site every day to make sure it is not infected, bleeding, or starting to pull apart.  Do not use lotions or ointments near the incision site unless directed to do so.  Keep the incision area clean and dry for 2-3 days after the procedure or as directed by your health care provider. It takes several weeks for the incision site to completely heal.  Do not take baths, swim, or use a hot tub for 7-10 days or as otherwise directed by your health care provider. Activity  Do not drive or use heavy machinery while taking prescription pain medicine.  Do not drive for 24 hours if you were given a medicine to help you relax (sedative).  Check with your health care provider before you start to drive or play sports.  Avoid sudden jerking, pulling, or chopping movements that pull your upper arm far away from your body. Avoid these movements for at least 6 weeks or as long as told by your health care provider.  Do  not lift your upper arm above your shoulders for at least 6 weeks or as long as told by your health care provider. This means no tennis, golf, or swimming.  You may go back to work when your health care provider says it is okay. Pacemaker care  You may be shown how to transfer data from your pacemaker through the phone to your health care provider.  Always let all health care providers know about your pacemaker before you have any medical procedures or tests.  Wear a medical ID bracelet or necklace stating that you have a pacemaker. Carry a pacemaker ID card with you at all times.  Your pacemaker battery will last for 5-15 years. Routine checks by your health care provider will let the health care provider know when the battery is starting to run down. The pacemaker will need to be replaced when the battery starts to run down.  Do not use amateur Chief of [employer]. Other electrical devices are safe to use, including power tools, lawn mowers, and speakers. If you are unsure of whether something is safe to use, ask your health care provider.  When using your cell phone, hold it to the ear opposite the pacemaker. Do not leave your cell phone in a pocket over the pacemaker.  Avoid places or objects that have a strong electric or magnetic field, including: ? Airport Herbalist. When at the airport, let officials know that you have  a pacemaker. ? Power plants. ? Large electrical generators. ? Radiofrequency transmission towers, such as cell phone and radio towers. General instructions  Weigh yourself every day. If you suddenly gain weight, fluid may be building up in your body.  Keep all follow-up visits as told by your health care provider. This is important. Contact a health care provider if:  You gain weight suddenly.  Your legs or feet swell.  It feels like your heart is fluttering or skipping beats (heart palpitations).  You have chills or a  fever.  You have more redness, swelling, or pain around your incisions.  You have more fluid or blood coming from your incisions.  Your incisions feel warm to the touch.  You have pus or a bad smell coming from your incisions. Get help right away if:  You have chest pain.  You have trouble breathing or are short of breath.  You become extremely tired.  You are light-headed or you faint. This information is not intended to replace advice given to you by your health care provider. Make sure you discuss any questions you have with your health care provider. Document Released: 07/14/2004 Document Revised: 10/07/2015 Document Reviewed: 10/07/2015 Elsevier Interactive Patient Education  2018 [place] Discharge Instructions for  Pacemaker/Defibrillator Patients  ACTIVITY No heavy lifting or vigorous activity with your left/right arm for 6 to 8 weeks.  Do not raise your left/right arm above your head for one week.  Gradually raise your affected arm as drawn below.           __  NO DRIVING for     ; you may begin driving on     .  WOUND CARE - Keep the wound area clean and dry.  Do not get this area wet for one week. No showers for one week; you may shower on     . - The tape/steri-strips on your wound will fall off; do not pull them off.  No bandage is needed on the site.  DO  NOT apply any creams, oils, or ointments to the wound area. - If you notice any drainage or discharge from the wound, any swelling or bruising at the site, or you develop a fever > 101? F after you are discharged home, call the office at once.  SPECIAL INSTRUCTIONS - You are still able to use cellular telephones; use the ear opposite the side where you have your pacemaker/defibrillator.  Avoid carrying your cellular phone near your device. - When traveling through airports, show security personnel your identification card to avoid being screened in the metal detectors.  Ask the security  personnel to use the hand wand. - Avoid arc welding equipment, MRI testing (magnetic resonance imaging), TENS units (transcutaneous nerve stimulators).  Call the office for questions about other devices. - Avoid electrical appliances that are in poor condition or are not properly grounded. - Microwave ovens are safe to be near or to operate.  ADDITIONAL INFORMATION FOR DEFIBRILLATOR PATIENTS SHOULD YOUR DEVICE GO OFF: - If your device goes off ONCE and you feel fine afterward, notify the device clinic nurses. - If your device goes off ONCE and you do not feel well afterward, call 911. - If your device goes off TWICE, call 911. - If your device goes off [place], call 911.  DO NOT DRIVE YOURSELF OR A FAMILY MEMBER WITH A DEFIBRILLATOR TO THE HOSPITAL--CALL 911.

## 2019-09-14 ENCOUNTER — Other Ambulatory Visit: Payer: Self-pay | Admitting: Cardiology

## 2019-09-15 ENCOUNTER — Encounter: Payer: Medicare Other | Admitting: Cardiology

## 2019-09-15 NOTE — Telephone Encounter (Signed)
Age 86, weight 103kg, SCr 1.05 on 06/11/19, CrCl 61 Last OV Sept 2021, afib indication

## 2019-09-30 ENCOUNTER — Ambulatory Visit (INDEPENDENT_AMBULATORY_CARE_PROVIDER_SITE_OTHER): Payer: Medicare Other | Admitting: Urology

## 2019-09-30 ENCOUNTER — Other Ambulatory Visit: Payer: Self-pay

## 2019-09-30 ENCOUNTER — Encounter: Payer: Self-pay | Admitting: Urology

## 2019-09-30 VITALS — BP 145/74 | HR 64 | Temp 98.1°F | Ht 63.0 in | Wt 227.4 lb

## 2019-09-30 DIAGNOSIS — C678 Malignant neoplasm of overlapping sites of bladder: Secondary | ICD-10-CM | POA: Insufficient documentation

## 2019-09-30 DIAGNOSIS — R3915 Urgency of urination: Secondary | ICD-10-CM | POA: Diagnosis not present

## 2019-09-30 LAB — URINALYSIS, ROUTINE W REFLEX MICROSCOPIC
Bilirubin, UA: NEGATIVE
Glucose, UA: NEGATIVE
Ketones, UA: NEGATIVE
Leukocytes,UA: NEGATIVE
Nitrite, UA: NEGATIVE
Protein,UA: NEGATIVE
RBC, UA: NEGATIVE
Specific Gravity, UA: 1.02 (ref 1.005–1.030)
Urobilinogen, Ur: 0.2 mg/dL (ref 0.2–1.0)
pH, UA: 7 (ref 5.0–7.5)

## 2019-09-30 MED ORDER — MIRABEGRON ER 25 MG PO TB24: 25.0000 mg | ORAL_TABLET | Freq: Every day | ORAL | 0 refills | Status: DC

## 2019-09-30 MED ORDER — CEPHALEXIN 500 MG PO CAPS: 500.0000 mg | ORAL_CAPSULE | Freq: Once | ORAL | 0 refills | Status: AC

## 2019-09-30 NOTE — Progress Notes (Signed)
09/30/2019 12:22 PM   Gloriann Loan Jan 11, 1930 [id number]  Referring provider: Rory Percy, MD [place],  [place] [id number]   urinary urgency  HPI: Ms Low is a 86yo here for bladder cancer followup and worsening urinary urgency. Over the past year she has had increased urinary urgency with associated urge incontinence which happens multiple times per day. 2-3 pads per day. Nocturia 1-4x. Stream strong. No hematuria. No recent dysuria   PMH: Past Medical History:  Diagnosis Date  . Arthritis   . Atrial flutter ([place])   . Bladder cancer ([place])   . Coronary atherosclerosis of native coronary artery    a. BMS RCA May 2014 - [place] stent. b. Cath 04/2017 - patent stent, minimal CAD otherwise.  . Depression   . Essential hypertension   . GI bleeding 06/2017   a. melena/small bowel ulcer by capsule endo 06/2017.  Marland Kitchen Hyperlipemia   . Macular degeneration   . NICM (nonischemic cardiomyopathy) ([place])   . Persistent atrial fibrillation ([place])   . Ulcerative colitis     Surgical History: Past Surgical History:  Procedure Laterality Date  . ABDOMINAL HYSTERECTOMY    . APPENDECTOMY    . Bilateral knee replacements      2007, 2008  . BLADDER SURGERY    . CARDIOVERSION N/A 02/04/2017   Procedure: CARDIOVERSION;  Surgeon: Arnoldo Lenis, MD;  [facility];  Service: Endoscopy;  Laterality: N/A;  . CARDIOVERSION N/A 02/26/2017   Procedure: CARDIOVERSION;  Surgeon: Satira Sark, MD;  Location: [facility];  Service: Cardiovascular;  Laterality: N/A;  . CARDIOVERSION N/A 08/07/2017   Procedure: CARDIOVERSION;  Surgeon: Pixie Casino, MD;  Location: [facility];  Service: Cardiovascular;  Laterality: N/A;  . COLONOSCOPY N/A 06/15/2015   Procedure: COLONOSCOPY;  Surgeon: Rogene Houston, MD;  [facility];  Service: Endoscopy;  Laterality: N/A;  210  . CYSTOSCOPY W/ RETROGRADES Bilateral 05/14/2016   Procedure: CYSTOSCOPY WITH BILATERAL RETROGRADE  PYELOGRAM;  Surgeon: Raynelle Bring, MD;  Location: [facility];  Service: Urology;  Laterality: Bilateral;  GENERAL ANESTHESIA WITH PARALYSIS  . ESOPHAGOGASTRODUODENOSCOPY (EGD) WITH PROPOFOL N/A 06/14/2017   Procedure: ESOPHAGOGASTRODUODENOSCOPY (EGD) WITH PROPOFOL;  Surgeon: Rogene Houston, MD;  [facility];  Service: Endoscopy;  Laterality: N/A;  . GIVENS CAPSULE STUDY  06/14/2017   Procedure: GIVENS CAPSULE STUDY;  Surgeon: Rogene Houston, MD;  [facility];  Service: Endoscopy;;  . LEFT HEART CATHETERIZATION WITH CORONARY ANGIOGRAM N/A 10/30/2013   Procedure: LEFT HEART CATHETERIZATION WITH CORONARY ANGIOGRAM;  Surgeon: Burnell Blanks, MD;  Location: [facility] CATH LAB;  Service: Cardiovascular;  Laterality: N/A;  . RIGHT/LEFT HEART CATH AND CORONARY ANGIOGRAPHY N/A 05/03/2017   Procedure: RIGHT/LEFT HEART CATH AND CORONARY ANGIOGRAPHY;  Surgeon: Leonie Man, MD;  Location: [facility] CV LAB;  Service: Cardiovascular;  Laterality: N/A;  . TEE WITHOUT CARDIOVERSION N/A 02/04/2017   Procedure: TRANSESOPHAGEAL ECHOCARDIOGRAM (TEE) WITH PROPOL;  Surgeon: Arnoldo Lenis, MD;  [facility];  Service: Endoscopy;  Laterality: N/A;  . TONSILLECTOMY    . TOTAL KNEE ARTHROPLASTY    . TRANSURETHRAL RESECTION OF BLADDER TUMOR N/A 05/14/2016   Procedure: TRANSURETHRAL RESECTION OF BLADDER TUMOR (TURBT);  Surgeon: Raynelle Bring, MD;  [facility];  Service: Urology;  Laterality: N/A;  GENERAL ANESTHESIA WITH PARALYSIS  . YAG LASER APPLICATION Bilateral 61/09/5091   Procedure: YAG LASER APPLICATION;  Surgeon: Williams Che, MD;  Location: [facility];  Service: Ophthalmology;  Laterality: Bilateral;    Home Medications:  Allergies as of 09/30/2019      Reactions   Sinus & Allergy [chlorpheniramine-phenylephrine] Shortness Of Breath   Carvedilol Itching, Rash   Facial rash/itching   Demerol Rash   Penicillins Rash, Other (See Comments)   REACTION: rash, years  ago Has patient had a PCN reaction causing immediate rash, facial/tongue/throat swelling, SOB or lightheadedness with hypotension: Yes Has patient had a PCN reaction causing severe rash involving mucus membranes or skin necrosis: No Has patient had a PCN reaction that required hospitalization No Has patient had a PCN reaction occurring within the last 10 years: No If all of the above answers are "NO", then may proceed with Cephalosporin use.      Medication List       Accurate as of September 30, 2019 12:22 PM. If you have any questions, ask your nurse or doctor.        acetaminophen 500 MG tablet Commonly known as: TYLENOL Take 1,000 mg by mouth 3 (three) times daily with meals.   atorvastatin 20 MG tablet Commonly known as: LIPITOR Take 1 tablet (20 mg total) by mouth at bedtime.   balsalazide 750 MG capsule Commonly known as: COLAZAL TAKE THREE CAPSULES BY MOUTH THREE TIMES DAILY   cephALEXin 500 MG capsule Commonly known as: Keflex Take 1 capsule (500 mg total) by mouth once for 1 dose. Started by: Nicolette Bang, MD   Cranberry 1000 MG Caps Take 4,200 mg by mouth daily. With vit C   dicyclomine 10 MG capsule Commonly known as: BENTYL Take 1 capsule (10 mg total) by mouth 2 (two) times daily as needed for spasms (abd pain).   dofetilide 250 MCG capsule Commonly known as: TIKOSYN Take 1 capsule (250 mcg total) by mouth 2 (two) times daily.   Entresto 24-26 MG Generic drug: sacubitril-valsartan TAKE 1 TABLET BY MOUTH TWICE DAILY - start ENTRESTO 48 HOURS AFTER STOPPING LISINOPRIL What changed: See the new instructions.   Eye Drops 0.05 % ophthalmic solution Generic drug: tetrahydrozoline Place 1 drop into both eyes daily as needed (Eye Burning). Thera tears   furosemide 20 MG tablet Commonly known as: LASIX TAKE TWO TABLETS BY MOUTH TWICE DAILY   ICAPS AREDS 2 PO Take 2 tablets by mouth daily at 12 noon.   mirabegron ER 25 MG Tb24 tablet Commonly known  as: MYRBETRIQ Take 1 tablet (25 mg total) by mouth daily. Started by: Nicolette Bang, MD   nitroGLYCERIN 0.4 MG SL tablet Commonly known as: NITROSTAT Place 0.4 mg under the tongue every 5 (five) minutes as needed for chest pain.   potassium chloride SA 20 MEQ tablet Commonly known as: KLOR-CON TAKE 1 TABLET BY MOUTH THREE TIMES DAILY   sodium chloride 2 % ophthalmic solution Commonly known as: MURO 128 Place 1 drop into both eyes every 4 (four) hours as needed for eye irritation.   spironolactone 25 MG tablet Commonly known as: ALDACTONE Take 0.5 tablets (12.5 mg total) by mouth daily for 90 doses.   Vitamin D-3 25 MCG (1000 UT) Caps Take 2,000 Units by mouth daily.   Xarelto 20 MG Tabs tablet Generic drug: rivaroxaban TAKE 1 TABLET BY MOUTH DAILY WITH SUPPER       Allergies:  Allergies  Allergen Reactions  . Sinus & Allergy [Chlorpheniramine-Phenylephrine] Shortness Of Breath  . Carvedilol Itching and Rash    Facial rash/itching  . Demerol Rash  . Penicillins Rash and Other (See  Comments)    REACTION: rash, years ago Has patient had a PCN reaction causing immediate rash, facial/tongue/throat swelling, SOB or lightheadedness with hypotension: Yes Has patient had a PCN reaction causing severe rash involving mucus membranes or skin necrosis: No Has patient had a PCN reaction that required hospitalization No Has patient had a PCN reaction occurring within the last 10 years: No If all of the above answers are "NO", then may proceed with Cephalosporin use.     Family History: Family History  Problem Relation Age of Onset  . CAD Father     Social History:  reports that she has never smoked. She has never used smokeless tobacco. She reports that she does not drink alcohol and does not use drugs.  ROS: All other review of systems were reviewed and are negative except what is noted above in HPI  Physical Exam: BP (!) 145/74   Pulse 64   Temp 98.1 F (36.7 C)    Ht 5' 3"  (1.6 m)   Wt 227 lb 6.4 oz (103.1 kg)   BMI 40.28 kg/m   Constitutional:  Alert and oriented, No acute distress. HEENT: [place] AT, moist mucus membranes.  Trachea midline, no masses. Cardiovascular: No clubbing, cyanosis, or edema. Respiratory: Normal respiratory effort, no increased work of breathing. GI: Abdomen is soft, nontender, nondistended, no abdominal masses GU: No CVA tenderness.  Lymph: No cervical or inguinal lymphadenopathy. Skin: No rashes, bruises or suspicious lesions. Neurologic: Grossly intact, no focal deficits, moving all 4 extremities. Psychiatric: Normal mood and affect.  Laboratory Data: Lab Results  Component Value Date   WBC 7.5 06/11/2019   HGB 13.7 06/11/2019   HCT 41.1 06/11/2019   MCV 93.2 06/11/2019   PLT 284 06/11/2019    Lab Results  Component Value Date   CREATININE 1.05 (H) 06/11/2019    No results found for: PSA  No results found for: TESTOSTERONE  No results found for: HGBA1C  Urinalysis    Component Value Date/Time   COLORURINE STRAW (A) 06/08/2019 1116   APPEARANCEUR CLEAR 06/08/2019 1116   LABSPEC 1.005 06/08/2019 1116   PHURINE 7.0 06/08/2019 1116   [place] 06/08/2019 1116   [place] 06/08/2019 1116   [place] 06/08/2019 1116   [place] 06/08/2019 1116   PROTEINUR NEGATIVE 06/08/2019 1116   NITRITE NEGATIVE 06/08/2019 1116   LEUKOCYTESUR NEGATIVE 06/08/2019 1116    Lab Results  Component Value Date   BACTERIA NONE SEEN 05/19/2016    Pertinent Imaging:  Results for orders placed during the hospital encounter of 09/29/05  DG Abd 1 View  Narrative Clinical data:   Nausea, weakness.  PICC line. CHEST - 1 VIEW: Comparison:  09/06/05. Findings:  Mild cardiomegaly.  No pulmonary vascular congestion or active lung process.  PICC line tip is in the superior vena cava, estimated to be approximately 4.3 cm above the superior vena cava/right atrial junction. IMPRESSION: Mild  cardiomegaly.  No acute chest findings. ABDOMEN - 2 VIEW: Findings:  Mild gaseous distention of colon in the hepatic flexure region.  Minimal small bowel distention.  No extraluminal gas. IMPRESSION: Findings compatible with mild nonspecific ileus.  Provider: Barbarann Ehlers  No results found for this or any previous visit.  No results found for this or any previous visit.  No results found for this or any previous visit.  Results for orders placed during the hospital encounter of 10/08/06  US Renal  Narrative Clinical Data: Urinary frequency. RENAL/URINARY TRACT ULTRASOUND: Technique: Complete ultrasound examination  of the urinary tract was performed including evaluation of the kidneys, renal collecting systems, and urinary bladder. Comparison: CT scan 12/14/04. Findings: The right kidney measures 12.3 cm in length and the left kidney measures 13.0 cm in length. No mass or hydronephrosis. There may be small stones in the lower pole of each kidney with one on the right measuring approximately 0.8 cm and on the left measuring approximately 0.7 cm. Incidental imaging of the urinary bladder is unremarkable. There is a hypoechoic focus in the region of the right adnexa measuring approximately 5.2 x 3.5 cm. This cannot be definitively characterized.  Impression Possible small bilateral renal stones without hydronephrosis. Possible right ovarian enlargement. Pelvic ultrasound could be used for further evaluation.  Provider: Mertie Clause  No results found for this or any previous visit.  No results found for this or any previous visit.  No results found for this or any previous visit.   Assessment & Plan:    1. Urinary urgency/OAB We will trial mirabegron 15m daily   No follow-ups on file.  PNicolette Bang MD  [facility]    09/30/19  CC: Followup bladder cancer  HPI: Ms BKumpis an [id number] for followup for high grade bladder cancer.  Her  records from [facility] are as follows: I have bladder cancer.  HPI: MRheanne Cortopassiis a 86year-old female established patient who is here for bladder cancer.  Her problem was diagnosed 05/25/2016. Her bladder cancer was diagnosed by Dr. BAlinda Money Her cancer was diagnosed at [facility]. The bladder cancer was found because of blood in her urine.   Her bladder cancer was treated by removal with scope. Patient denies removal of the entire bladder, radiation, and chemotherapy.   She does have a good appetite. BOWEL HABITS: her bowels are moving normally. She is having pain in new locations. She has recently had unwanted weight loss.   10/10/2016: She underwent TURBT in 05/2016 with pathology was TaG3. She underwent BCG 6 weeks.   04/17/2017: no hematuria or dysuria   08/27/2017: no hematuria or dysuria.   01/29/2018: no hematuria or dysuria   09/10/2018: NO new LUTS. no hematuria. no dysuria      Blood pressure (!) 145/74, pulse 64, temperature 98.1 F (36.7 C), height 5' 3"  (1.6 m), weight 227 lb 6.4 oz (103.1 kg). NED. A&Ox3.   No respiratory distress   Abd soft, NT, ND Normal external genitalia with patent urethral meatus  Cystoscopy Procedure Note  Patient identification was confirmed, informed consent was obtained, and patient was prepped using Betadine solution.  Lidocaine jelly was administered per urethral meatus.    Procedure: - Flexible cystoscope introduced, without any difficulty.   - Thorough search of the bladder revealed:    normal urethral meatus    normal urothelium    no stones    no ulcers     no tumors    no urethral polyps    no trabeculation  - Ureteral orifices were normal in position and appearance.  Post-Procedure: - Patient tolerated the procedure well  Assessment/ Plan:    No follow-ups on file.  PNicolette Bang MD

## 2019-09-30 NOTE — Patient Instructions (Signed)

## 2019-09-30 NOTE — Progress Notes (Signed)
Urological Symptom Review  Patient is experiencing the following symptoms: Frequent urination Hard to postpone urination Burning/pain with urination Get up at night to urinate Leakage of urine   Review of Systems  Gastrointestinal (upper)  : Negative for upper GI symptoms  Gastrointestinal (lower) : Negative for lower GI symptoms  Constitutional : Fatigue  Skin: Itching  Eyes: Negative for eye symptoms  Ear/Nose/Throat : Negative for Ear/Nose/Throat symptoms  Hematologic/Lymphatic: Easy bruising  Cardiovascular : Leg swelling  Chest pain  Respiratory : Shortness of breath  Endocrine: Negative for endocrine symptoms  Musculoskeletal: Joint pain  Neurological: Negative for neurological symptoms  Psychologic: Negative for psychiatric symptoms

## 2019-10-08 DIAGNOSIS — I482 Chronic atrial fibrillation, unspecified: Secondary | ICD-10-CM | POA: Diagnosis not present

## 2019-10-08 DIAGNOSIS — I110 Hypertensive heart disease with heart failure: Secondary | ICD-10-CM | POA: Diagnosis not present

## 2019-10-08 DIAGNOSIS — I5022 Chronic systolic (congestive) heart failure: Secondary | ICD-10-CM | POA: Diagnosis not present

## 2019-10-08 DIAGNOSIS — I428 Other cardiomyopathies: Secondary | ICD-10-CM | POA: Diagnosis not present

## 2019-10-13 ENCOUNTER — Ambulatory Visit (INDEPENDENT_AMBULATORY_CARE_PROVIDER_SITE_OTHER): Payer: Medicare Other | Admitting: Internal Medicine

## 2019-10-13 ENCOUNTER — Other Ambulatory Visit: Payer: Self-pay

## 2019-10-13 ENCOUNTER — Other Ambulatory Visit: Payer: Self-pay | Admitting: Cardiology

## 2019-10-13 ENCOUNTER — Encounter (INDEPENDENT_AMBULATORY_CARE_PROVIDER_SITE_OTHER): Payer: Self-pay | Admitting: Internal Medicine

## 2019-10-13 VITALS — BP 112/70 | HR 62 | Temp 98.2°F | Ht 63.0 in | Wt 229.7 lb

## 2019-10-13 DIAGNOSIS — K5130 Ulcerative (chronic) rectosigmoiditis without complications: Secondary | ICD-10-CM | POA: Diagnosis not present

## 2019-10-13 DIAGNOSIS — K8020 Calculus of gallbladder without cholecystitis without obstruction: Secondary | ICD-10-CM | POA: Diagnosis not present

## 2019-10-13 NOTE — Patient Instructions (Signed)
Use dicyclomine on a as needed basis for lower abdominal pain or cramping.  If you experience any side effects take it anymore. Physician will call with results of stool test.

## 2019-10-13 NOTE — Progress Notes (Signed)
Presenting complaint;  Follow-up for ulcerative colitis. Recent hospitalization at [facility] upper right-sided subscapular and flank pain and elevated lipase of over 3000.  Database and subjective:  Patient is 86 year old Caucasian female who has history of pan ulcerative colitis which was diagnosed in January 2007 when she presented with diarrhea and heme positive stool.  She has been maintained on oral mesalamine.  Last surveillance colonoscopy was in June 2017 with removal of 5 small polyps and these are tubular adenomas.  She was in remission. She was seen in the office on 06/11/2019 as it was concerning for relapse of her UC.  Lab studies unremarkable.  She is advised to use dicyclomine on as-needed basis as we felt she may have an element of IBS. Patient is maintained on balsalazide. She says she is doing well.  She is having 1-2 formed stools daily.  Every now and then she has explosive bowel movement.  She says her appetite is not good when she wakes in the morning but as the day progresses it gets better.  She has gained 7 pounds since her last visit of June 11, 2019.  She says last year she was going to weight watchers and she had lost 30 pounds but she had gained most of it back. Patient was admitted to [facility] on 06/17/2019 with sudden onset of severe pain in right infrascapular region radiating anterolaterally and into her flank but not on the right costal margin.  She was found to have serum lipase of 3681 but LFTs were normal.  Her lipase dropped to 203 the next day and then 103.  She had abdominal pelvic CT revealing cholelithiasis without gallbladder wall thickening or dilated bile duct.  Pancreas was unremarkable.  She also changes of DJD involving lumbar spine. Patient says she has not had similar pain. She is using walker to ambulate.  Current Medications: Outpatient Encounter Medications as of 10/13/2019  Medication Sig  . acetaminophen (TYLENOL) 500 MG tablet Take 1,000 mg by mouth 3  (three) times daily with meals.   Marland Kitchen atorvastatin (LIPITOR) 20 MG tablet Take 1 tablet (20 mg total) by mouth at bedtime.  . balsalazide (COLAZAL) 750 MG capsule TAKE THREE CAPSULES BY MOUTH THREE TIMES DAILY (Patient taking differently: Take 2,250 mg by mouth 3 (three) times daily. )  . Cholecalciferol (VITAMIN D-3) 25 MCG (1000 UT) CAPS Take 2,000 Units by mouth daily.   . Cranberry 1000 MG CAPS Take 4,200 mg by mouth daily. With vit C  . dicyclomine (BENTYL) 10 MG capsule Take 1 capsule (10 mg total) by mouth 2 (two) times daily as needed for spasms (abd pain).  Marland Kitchen dofetilide (TIKOSYN) 250 MCG capsule Take 1 capsule (250 mcg total) by mouth 2 (two) times daily.  Marland Kitchen ENTRESTO 24-26 MG TAKE 1 TABLET BY MOUTH TWICE DAILY - start ENTRESTO 48 HOURS AFTER STOPPING LISINOPRIL (Patient taking differently: Take 1 tablet by mouth 2 (two) times daily. )  . furosemide (LASIX) 20 MG tablet TAKE TWO TABLETS BY MOUTH TWICE DAILY  . mirabegron ER (MYRBETRIQ) 25 MG TB24 tablet Take 1 tablet (25 mg total) by mouth daily.  . Multiple Vitamins-Minerals (ICAPS AREDS 2 PO) Take 2 tablets by mouth daily at 12 noon.   . nitroGLYCERIN (NITROSTAT) 0.4 MG SL tablet Place 0.4 mg under the tongue every 5 (five) minutes as needed for chest pain.  . potassium chloride SA (KLOR-CON) 20 MEQ tablet TAKE 1 TABLET BY MOUTH THREE TIMES DAILY (Patient taking differently: Take 20 mEq by  mouth 3 (three) times daily. )  . sodium chloride (MURO 128) 2 % ophthalmic solution Place 1 drop into both eyes every 4 (four) hours as needed for eye irritation.  Marland Kitchen spironolactone (ALDACTONE) 25 MG tablet TAKE 1/2 TABLET BY MOUTH EVERY DAY  . tetrahydrozoline (EYE DROPS) 0.05 % ophthalmic solution Place 1 drop into both eyes daily as needed (Eye Burning). Thera tears  . XARELTO 20 MG TABS tablet TAKE 1 TABLET BY MOUTH DAILY WITH SUPPER   No facility-administered encounter medications on file as of 10/13/2019.     Objective: Blood pressure 112/70,  pulse 62, temperature 98.2 F (36.8 C), temperature source Oral, height 5' 3"  (1.6 m), weight 229 lb 11.2 oz (104.2 kg). Patient is alert and in no acute distress. Conjunctiva is pink. Sclera is nonicteric Oropharyngeal mucosa is normal. No neck masses or thyromegaly noted. Cardiac exam with regular rhythm normal S1 and S2. No murmur or gallop noted. Lungs are clear to auscultation. Abdomen is full but soft and nontender with no organomegaly or masses. No LE edema or clubbing noted.  Labs/studies Results:  CBC Latest Ref Rng & Units 06/11/2019 06/08/2019 06/11/2018  WBC 3.8 - 10.8 Thousand/uL 7.5 7.0 7.2  Hemoglobin 11.7 - 15.5 g/dL 13.7 14.1 14.2  Hematocrit 35 - 45 % 41.1 43.3 42.4  Platelets 140 - 400 Thousand/uL 284 271 309    CMP Latest Ref Rng & Units 06/11/2019 06/08/2019 11/20/2018  Glucose 65 - 139 mg/dL 97 106(H) 105  BUN 7 - 25 mg/dL 17 15 25   Creatinine 0.60 - 0.88 mg/dL 1.05(H) 0.82 0.96(H)  Sodium 135 - 146 mmol/L 135 137 136  Potassium 3.5 - 5.3 mmol/L 4.8 4.2 4.6  Chloride 98 - 110 mmol/L 99 101 100  CO2 20 - 32 mmol/L 26 25 22   Calcium 8.6 - 10.4 mg/dL 9.9 9.7 9.9  Total Protein 6.1 - 8.1 g/dL 7.0 7.7 -  Total Bilirubin 0.2 - 1.2 mg/dL 1.4(H) 1.8(H) -  Alkaline Phos 38 - 126 U/L - 50 -  AST 10 - 35 U/L 18 24 -  ALT 6 - 29 U/L 13 18 -    Hepatic Function Latest Ref Rng & Units 06/11/2019 06/08/2019 07/23/2017  Total Protein 6.1 - 8.1 g/dL 7.0 7.7 6.9  Albumin 3.5 - 5.0 g/dL - 4.3 3.6  AST 10 - 35 U/L 18 24 22   ALT 6 - 29 U/L 13 18 12   Alk Phosphatase 38 - 126 U/L - 50 56  Total Bilirubin 0.2 - 1.2 mg/dL 1.4(H) 1.8(H) 1.6(H)    Lab Results  Component Value Date   CRP 0.5 06/11/2019      Assessment:  #1.  History of pan ulcerative colitis.  Disease duration 14 years.  She appears to be in remission.  Last colonoscopy was in June 2017 revealing 5 small tubular adenomas.  She was in endoscopic remission.  Future colonoscopy is on hold because of age.  #2.  Recent  episode of right infrascapular and flank pain with markedly elevated serum lipase most likely due to biliary pancreatitis unless serum lipase value falls.  She is at risk for further episodes but she is high risk for cholecystectomy as well.  Even if she has mild symptoms would recommend cholecystectomy.   Plan:  Continue balsalazide at current dose of 2250 mg p.o. 3 times daily. Use dicyclomine only on as-needed basis. Fecal calprotectin. Patient will call office if she has right-sided epigastric abdominal pain. Office visit in 6 months.

## 2019-10-22 ENCOUNTER — Other Ambulatory Visit (INDEPENDENT_AMBULATORY_CARE_PROVIDER_SITE_OTHER): Payer: Self-pay | Admitting: Internal Medicine

## 2019-10-22 ENCOUNTER — Other Ambulatory Visit: Payer: Self-pay

## 2019-10-22 DIAGNOSIS — K5130 Ulcerative (chronic) rectosigmoiditis without complications: Secondary | ICD-10-CM | POA: Diagnosis not present

## 2019-10-22 DIAGNOSIS — R3915 Urgency of urination: Secondary | ICD-10-CM

## 2019-10-22 MED ORDER — MIRABEGRON ER 25 MG PO TB24: 25.0000 mg | ORAL_TABLET | Freq: Every day | ORAL | 11 refills | Status: DC

## 2019-10-26 ENCOUNTER — Encounter: Payer: Self-pay | Admitting: Cardiology

## 2019-10-26 ENCOUNTER — Other Ambulatory Visit: Payer: Self-pay

## 2019-10-26 ENCOUNTER — Ambulatory Visit: Payer: Medicare Other | Admitting: Cardiology

## 2019-10-26 VITALS — BP 122/78 | HR 62 | Ht 63.0 in | Wt 226.8 lb

## 2019-10-26 DIAGNOSIS — Z01812 Encounter for preprocedural laboratory examination: Secondary | ICD-10-CM | POA: Diagnosis not present

## 2019-10-26 DIAGNOSIS — I428 Other cardiomyopathies: Secondary | ICD-10-CM | POA: Diagnosis not present

## 2019-10-26 NOTE — Patient Instructions (Signed)
Medication Instructions:  Your physician recommends that you continue on your current medications as directed. Please refer to the Current Medication list given to you today.  *If you need a refill on your cardiac medications before your next appointment, please call your pharmacy*   Lab Work: None ordered If you have labs (blood work) drawn today and your tests are completely normal, you will receive your results only by: Marland Kitchen MyChart Message (if you have MyChart) OR . A paper copy in the mail If you have any lab test that is abnormal or we need to change your treatment, we will call you to review the results.   Testing/Procedures: None ordered   Follow-Up: At [facility], you and your health needs are our priority.  As part of our continuing mission to provide you with exceptional heart care, we have created designated Provider Care Teams.  These Care Teams include your primary Cardiologist (physician) and Advanced Practice Providers (APPs -  Physician Assistants and Nurse Practitioners) who all work together to provide you with the care you need, when you need it.  We recommend signing up for the patient portal called "MyChart".  Sign up information is provided on this After Visit Summary.  MyChart is used to connect with patients for Virtual Visits (Telemedicine).  Patients are able to view lab/test results, encounter notes, upcoming appointments, etc.  Non-urgent messages can be sent to your provider as well.   To learn more about what you can do with MyChart, go to [email or web address].    Your next appointment:   2 week(s) after your pacemaker implant on 11/13/19   The format for your next appointment:   In Person  Provider:   device clinic for a wound check    Thank you for choosing [facility]!!   Trinidad Curet, RN [phone]   Other Instructions   Implantable Device Instructions  You are scheduled for: Implantable cardiac defibrillator on 11/13/2019  with Dr. Curt Bears.  1.   Pre procedure testing-             A.  LAB WORK--- On 10/26/2019 - You do not need to be fasting.               B. COVID TEST-- On 11/11/2019 @ 11:00 am - This is a Drive Up Visit at [facility] will direct you to the appropriate testing line. Stay in your car and someone will be with you shortly.   After you are tested please go home and self quarantine until the day of your procedure.    2. On the day of your procedure 11/13/2019 you will go to [facility] [phone]) at 10:30 am.  Dennis Bast will go to the main entrance [address] [employer]) and enter where the [employer] are.  You will check in at ADMITTING.  You may have one support person come in to the hospital with you.  They will be asked to wait in the waiting room.   3.   Do not eat or drink after midnight prior to your procedure.   4.   On the morning of your procedure do NOT take any medication.  5.  The night before your procedure and the morning of your procedure scrub your neck/chest with surgical scrub.  An instruction letter is included with this letter.    5.  Plan for an overnight stay.  If you use your phone frequently bring your phone charger.  When you are discharged you will need someone to drive you home.   6.  You will follow up with the [facility] 10-14 days after your procedure. You will follow up with Dr. Curt Bears 91 days after your procedure.  These appointments will be made for you.   * If you have ANY questions after you get home, please call the office [phone] and ask for Jeryl Wilbourn RN or send a MyChart message.   [facility] - Preparing For Surgery (surgery scrub)  Before surgery, you can play an important role. Because skin is not sterile, your skin needs to be as free of germs as possible. You can reduce the number of germs on your skin by washing with CHG (chlorahexidine gluconate) Soap before surgery.  CHG is an antiseptic cleaner which  kills germs and bonds with the skin to continue killing germs even after washing.   Please do not use if you have an allergy to CHG or antibacterial soaps.  If your skin becomes reddened/irritated stop using the CHG.   Do not shave (including legs and underarms) for at least 48 hours prior to first CHG shower.  It is OK to shave your face.  Please follow these instructions carefully:  1.  Shower the night before surgery and the morning of surgery with CHG.  2.  If you choose to wash your hair, wash your hair first as usual with your normal shampoo.  3.  After you shampoo, rinse your hair and body thoroughly to remove the shampoo.  4.  Use CHG as you would any other liquid soap.  You can apply CHG directly to the skin and wash gently with a clean washcloth. 5.  Apply the CHG Soap to your body ONLY FROM THE NECK DOWN.  Do not use on open wounds or open sores.  Avoid contact with your eyes, ears, mouth and genitals (private parts).  Wash genitals (private parts) with your normal soap.  6.  Wash thoroughly, paying special attention to the area where your surgery will be performed.  7.  Thoroughly rinse your body with warm water from the neck down.   8.  DO NOT shower/wash with your normal soap after using and rinsing off the CHG soap.  9.  Pat yourself dry with a clean towel.           10.  Wear clean pajamas.           11.  Place clean sheets on your bed the night of your first shower and do not sleep with pets.  Day of Surgery: Do not apply any deodorants/lotions.  Please wear clean clothes to the hospital/surgery center.    Cardioverter Defibrillator Implantation  An implantable cardioverter defibrillator (ICD) is a small device that is placed under the skin in the chest or abdomen. An ICD consists of a battery, a small computer (pulse generator), and wires (leads) that go into the heart. An ICD is used to detect and correct two types of dangerous irregular heartbeats (arrhythmias):  A  rapid heart rhythm (tachycardia).  An arrhythmia in which the lower chambers of the heart (ventricles) contract in an uncoordinated way (fibrillation). When an ICD detects tachycardia, it sends a low-energy shock to the heart to restore the heartbeat to normal (cardioversion). This signal is usually painless. If cardioversion does not work or if the ICD detects fibrillation, it delivers a high-energy shock to the heart (defibrillation) to restart the heart. This shock may feel  like a strong jolt in the chest. Your health care provider may prescribe an ICD if:  You have had an arrhythmia that originated in the ventricles.  Your heart has been damaged by a disease or heart condition. Sometimes, ICDs are programmed to act as a device called a pacemaker. Pacemakers can be used to treat a slow heartbeat (bradycardia) or tachycardia by taking over the heart rate with electrical impulses. Tell a health care provider about:  Any allergies you have.  All medicines you are taking, including vitamins, herbs, eye drops, creams, and over-the-counter medicines.  Any problems you or family members have had with anesthetic medicines.  Any blood disorders you have.  Any surgeries you have had.  Any medical conditions you have.  Whether you are pregnant or may be pregnant. What are the risks? Generally, this is a safe procedure. However, problems may occur, including:  Swelling, bleeding, or bruising.  Infection.  Blood clots.  Damage to other structures or organs, such as nerves, blood vessels, or the heart.  Allergic reactions to medicines used during the procedure. What happens before the procedure? Staying hydrated Follow instructions from your health care provider about hydration, which may include:  Up to 2 hours before the procedure - you may continue to drink clear liquids, such as water, clear fruit juice, black coffee, and plain tea. Eating and drinking restrictions Follow  instructions from your health care provider about eating and drinking, which may include:  8 hours before the procedure - stop eating heavy meals or foods such as meat, fried foods, or fatty foods.  6 hours before the procedure - stop eating light meals or foods, such as toast or cereal.  6 hours before the procedure - stop drinking milk or drinks that contain milk.  2 hours before the procedure - stop drinking clear liquids. Medicine Ask your health care provider about:  Changing or stopping your normal medicines. This is important if you take diabetes medicines or blood thinners.  Taking medicines such as aspirin and ibuprofen. These medicines can thin your blood. Do not take these medicines before your procedure if your doctor tells you not to. Tests  You may have blood tests.  You may have a test to check the electrical signals in your heart (electrocardiogram, ECG).  You may have imaging tests, such as a chest X-ray. General instructions  For 24 hours before the procedure, stop using products that contain nicotine or tobacco, such as cigarettes and e-cigarettes. If you need help quitting, ask your health care provider.  Plan to have someone take you home from the hospital or clinic.  You may be asked to shower with a germ-killing soap. What happens during the procedure?  To reduce your risk of infection: ? Your health care team will wash or sanitize their hands. ? Your skin will be washed with soap. ? Hair may be removed from the surgical area.  Small monitors will be put on your body. They will be used to check your heart, blood pressure, and oxygen level.  An IV tube will be inserted into one of your veins.  You will be given one or more of the following: ? A medicine to help you relax (sedative). ? A medicine to numb the area (local anesthetic). ? A medicine to make you fall asleep (general anesthetic).  Leads will be guided through a blood vessel into your heart  and attached to your heart muscles. Depending on the ICD, the leads may go into  one ventricle or they may go into both ventricles and into an upper chamber of the heart. An X-ray machine (fluoroscope) will be usedto help guide the leads.  A small incision will be made to create a deep pocket under your skin.  The pulse generator will be placed into the pocket.  The ICD will be tested.  The incision will be closed with stitches (sutures), skin glue, or staples.  A bandage (dressing) will be placed over the incision. This procedure may vary among health care providers and hospitals. What happens after the procedure?  Your blood pressure, heart rate, breathing rate, and blood oxygen level will be monitored often until the medicines you were given have worn off.  A chest X-ray will be taken to check that the ICD is in the right place.  You will need to stay in the hospital for 1-2 days so your health care provider can make sure your ICD is working.  Do not drive for 24 hours if you received a sedative. Ask your health care provider when it is safe for you to drive.  You may be given an identification card explaining that you have an ICD. Summary  An implantable cardioverter defibrillator (ICD) is a small device that is placed under the skin in the chest or abdomen. It is used to detect and correct dangerous irregular heartbeats (arrhythmias).  An ICD consists of a battery, a small computer (pulse generator), and wires (leads) that go into the heart.  When an ICD detects rapid heart rhythm (tachycardia), it sends a low-energy shock to the heart to restore the heartbeat to normal (cardioversion). If cardioversion does not work or if the ICD detects uncoordinated heart contractions (fibrillation), it delivers a high-energy shock to the heart (defibrillation) to restart the heart.  You will need to stay in the hospital for 1-2 days to make sure your ICD is working. This information is not  intended to replace advice given to you by your health care provider. Make sure you discuss any questions you have with your health care provider. Document Revised: 12/07/2016 Document Reviewed: 01/04/2016 Elsevier Patient Education  2020 [employer].

## 2019-10-26 NOTE — Progress Notes (Signed)
Electrophysiology Office Note   Date:  10/26/2019   ID:  [name] 01/22/1930, MRN [id number]  PCP:  Rory Percy, MD  Cardiologist:  Domenic Polite Primary Electrophysiologist:  Amari Zagal Meredith Leeds, MD    No chief complaint on file.    History of Present Illness: [name] is a 86 y.o. female who is being seen today for the evaluation of atrial fibrillation at the request of Rory Percy, MD. Presenting today for electrophysiology evaluation.    She has a history of atrial flutter, atrial fibrillation, coronary artery disease status post RCA stent, and nonischemic cardiomyopathy.  She has an ejection fraction of 25%.  She has plans for CRT-P implant.  Today, denies symptoms of palpitations, chest pain, shortness of breath, orthopnea, PND, lower extremity edema, claudication, dizziness, presyncope, syncope, bleeding, or neurologic sequela. The patient is tolerating medications without difficulties.  She has complaints of fatigue and dyspnea on exertion.  She feels that she would like to try CRT-P implant to see if this [name] improve her symptoms.   Past Medical History:  Diagnosis Date  . Arthritis   . Atrial flutter ([place])   . Bladder cancer ([place])   . Coronary atherosclerosis of native coronary artery    a. BMS RCA May 2014 - [place] stent. b. Cath 04/2017 - patent stent, minimal CAD otherwise.  . Depression   . Essential hypertension   . GI bleeding 06/2017   a. melena/small bowel ulcer by capsule endo 06/2017.  Marland Kitchen Hyperlipemia   . Macular degeneration   . NICM (nonischemic cardiomyopathy) ([place])   . Persistent atrial fibrillation ([place])   . Ulcerative colitis    Past Surgical History:  Procedure Laterality Date  . ABDOMINAL HYSTERECTOMY    . APPENDECTOMY    . Bilateral knee replacements      2007, 2008  . BLADDER SURGERY    . CARDIOVERSION N/A 02/04/2017   Procedure: CARDIOVERSION;  Surgeon: Arnoldo Lenis, MD;  [facility];  Service:  Endoscopy;  Laterality: N/A;  . CARDIOVERSION N/A 02/26/2017   Procedure: CARDIOVERSION;  Surgeon: Satira Sark, MD;  Location: [facility];  Service: Cardiovascular;  Laterality: N/A;  . CARDIOVERSION N/A 08/07/2017   Procedure: CARDIOVERSION;  Surgeon: Pixie Casino, MD;  Location: [facility];  Service: Cardiovascular;  Laterality: N/A;  . COLONOSCOPY N/A 06/15/2015   Procedure: COLONOSCOPY;  Surgeon: Rogene [place], MD;  [facility];  Service: Endoscopy;  Laterality: N/A;  210  . CYSTOSCOPY W/ RETROGRADES Bilateral 05/14/2016   Procedure: CYSTOSCOPY WITH BILATERAL RETROGRADE PYELOGRAM;  Surgeon: Raynelle Bring, MD;  Location: [facility];  Service: Urology;  Laterality: Bilateral;  GENERAL ANESTHESIA WITH PARALYSIS  . ESOPHAGOGASTRODUODENOSCOPY (EGD) WITH PROPOFOL N/A 06/14/2017   Procedure: ESOPHAGOGASTRODUODENOSCOPY (EGD) WITH PROPOFOL;  Surgeon: Rogene [place], MD;  [facility];  Service: Endoscopy;  Laterality: N/A;  . GIVENS CAPSULE STUDY  06/14/2017   Procedure: GIVENS CAPSULE STUDY;  Surgeon: Rogene [place], MD;  [facility];  Service: Endoscopy;;  . LEFT HEART CATHETERIZATION WITH CORONARY ANGIOGRAM N/A 10/30/2013   Procedure: LEFT HEART CATHETERIZATION WITH CORONARY ANGIOGRAM;  Surgeon: Burnell Blanks, MD;  Location: [facility] CATH LAB;  Service: Cardiovascular;  Laterality: N/A;  . RIGHT/LEFT HEART CATH AND CORONARY ANGIOGRAPHY N/A 05/03/2017   Procedure: RIGHT/LEFT HEART CATH AND CORONARY ANGIOGRAPHY;  Surgeon: Leonie Man, MD;  Location: [facility] CV LAB;  Service: Cardiovascular;  Laterality: N/A;  . TEE WITHOUT CARDIOVERSION N/A  02/04/2017   Procedure: TRANSESOPHAGEAL ECHOCARDIOGRAM (TEE) WITH PROPOL;  Surgeon: Arnoldo Lenis, MD;  [facility];  Service: Endoscopy;  Laterality: N/A;  . TONSILLECTOMY    . TOTAL KNEE ARTHROPLASTY    . TRANSURETHRAL RESECTION OF BLADDER TUMOR N/A 05/14/2016   Procedure: TRANSURETHRAL RESECTION OF  BLADDER TUMOR (TURBT);  Surgeon: Raynelle Bring, MD;  Location: [facility];  Service: Urology;  Laterality: N/A;  GENERAL ANESTHESIA WITH PARALYSIS  . YAG LASER APPLICATION Bilateral 16/01/958   Procedure: YAG LASER APPLICATION;  Surgeon: Williams Che, MD;  Location: [facility];  Service: Ophthalmology;  Laterality: Bilateral;     Current Outpatient Medications  Medication Sig Dispense Refill  . acetaminophen (TYLENOL) 500 MG tablet Take 1,000 mg by mouth 3 (three) times daily with meals.     Marland Kitchen atorvastatin (LIPITOR) 20 MG tablet Take 1 tablet (20 mg total) by mouth at bedtime. 90 tablet 3  . balsalazide (COLAZAL) 750 MG capsule TAKE THREE CAPSULES BY MOUTH THREE TIMES DAILY (Patient taking differently: Take 2,250 mg by mouth 3 (three) times daily. ) 270 capsule 5  . Cholecalciferol (VITAMIN D-3) 25 MCG (1000 UT) CAPS Take 2,000 Units by mouth daily.     . Cranberry 1000 MG CAPS Take 4,200 mg by mouth daily. With vit C    . dofetilide (TIKOSYN) 250 MCG capsule Take 1 capsule (250 mcg total) by mouth 2 (two) times daily. 60 capsule 11  . ENTRESTO 24-26 MG TAKE 1 TABLET BY MOUTH TWICE DAILY - start ENTRESTO 48 HOURS AFTER STOPPING LISINOPRIL (Patient taking differently: Take 1 tablet by mouth 2 (two) times daily. ) 60 tablet 6  . furosemide (LASIX) 20 MG tablet TAKE TWO TABLETS BY MOUTH TWICE DAILY 360 tablet 1  . mirabegron ER (MYRBETRIQ) 25 MG TB24 tablet Take 1 tablet (25 mg total) by mouth daily. 30 tablet 11  . Multiple Vitamins-Minerals (ICAPS AREDS 2 PO) Take 2 tablets by mouth daily at 12 noon.     . nitroGLYCERIN (NITROSTAT) 0.4 MG SL tablet Place 0.4 mg under the tongue every 5 (five) minutes as needed for chest pain.    . potassium chloride SA (KLOR-CON) 20 MEQ tablet TAKE 1 TABLET BY MOUTH THREE TIMES DAILY (Patient taking differently: Take 20 mEq by mouth 3 (three) times daily. ) 90 tablet 6  . sodium chloride (MURO 128) 2 % ophthalmic solution Place 1 drop into both eyes every 4 (four)  hours as needed for eye irritation.    Marland Kitchen spironolactone (ALDACTONE) 25 MG tablet TAKE 1/2 TABLET BY MOUTH EVERY DAY 45 tablet 3  . tetrahydrozoline (EYE DROPS) 0.05 % ophthalmic solution Place 1 drop into both eyes daily as needed (Eye Burning). Thera tears    . XARELTO 20 MG TABS tablet TAKE 1 TABLET BY MOUTH DAILY WITH SUPPER 30 tablet 6  . dicyclomine (BENTYL) 10 MG capsule Take 1 capsule (10 mg total) by mouth 2 (two) times daily as needed for spasms (abd pain). (Patient not taking: Reported on 10/26/2019) 30 capsule 0   No current facility-administered medications for this visit.    Allergies:   Sinus & allergy [chlorpheniramine-phenylephrine], Carvedilol, Demerol, and Penicillins   Social History:  The patient  reports that she has never smoked. She has never used smokeless tobacco. She reports that she does not drink alcohol and does not use drugs.   Family History:  The patient's family history includes CAD in her father.   ROS:  Please see the history of  present illness.   Otherwise, review of systems is positive for none.   All other systems are reviewed and negative.   PHYSICAL EXAM: VS:  BP 122/78   Pulse 62   Ht 5' 3"  (1.6 m)   Wt 226 lb 12.8 oz (102.9 kg)   BMI 40.18 kg/m  , BMI Body mass index is 40.18 kg/m. GEN: Well nourished, well developed, in no acute distress  HEENT: normal  Neck: no JVD, carotid bruits, or masses Cardiac: RRR; no murmurs, rubs, or gallops,no edema  Respiratory:  clear to auscultation bilaterally, normal work of breathing GI: soft, nontender, nondistended, + BS MS: no deformity or atrophy  Skin: warm and dry Neuro:  Strength and sensation are intact Psych: euthymic mood, full affect  EKG:  EKG is not ordered today. Personal review of the ekg ordered 09/10/19 shows sinus rhythm, PVCs, left bundle branch block  Recent Labs: 06/11/2019: ALT 13; BUN 17; Creat 1.05; Hemoglobin 13.7; Platelets 284; Potassium 4.8; Sodium 135    Lipid Panel  No  results found for: CHOL, TRIG, HDL, CHOLHDL, VLDL, LDLCALC, LDLDIRECT   Wt Readings from Last 3 Encounters:  10/26/19 226 lb 12.8 oz (102.9 kg)  10/13/19 229 lb 11.2 oz (104.2 kg)  09/30/19 227 lb 6.4 oz (103.1 kg)      Other studies Reviewed: Additional studies/ records that were reviewed today include: TTE 11/06/18 Review of the above records today demonstrates:   1. Left ventricular ejection fraction, by visual estimation, is 20 to 25%. The left ventricle has severely decreased function. There is mildly increased left ventricular hypertrophy. There is diffuse hypokinesis.  2. Abnormal septal motion consistent with left bundle branch block.  3. Left ventricular diastolic parameters are consistent with Grade I diastolic dysfunction (impaired relaxation).  4. Global right ventricle has normal systolic function.The right ventricular size is normal. No increase in right ventricular wall thickness.  5. Left atrial size was normal.  6. Right atrial size was normal.  7. Mild to moderate mitral annular calcification.  8. Moderate aortic valve annular calcification.  9. The mitral valve is grossly normal. Mild mitral valve regurgitation. 10. The tricuspid valve is grossly normal. Tricuspid valve regurgitation is trivial. 11. The aortic valve is tricuspid. Aortic valve regurgitation is not visualized. 12. The pulmonic valve was grossly normal. Pulmonic valve regurgitation is mild. 13. The inferior vena cava is normal in size with greater than 50% respiratory variability, suggesting right atrial pressure of 3 mmHg. 14. TR signal is inadequate for assessing pulmonary artery systolic pressure.  LHC 05/03/17  Hemodynamic findings consistent with mild secondary pulmonary hypertension.  Patient has severe nonischemic cardiomyopathy  Moderate to Severely reduced CO/CI  LV end diastolic pressure is moderately elevated.  Mid RCA stent widely patent  There is mild (2+) mitral  regurgitation.  [place] 09/16/18 personally reviewed Max 197 bpm 09:12am, 08/24 Min 38 bpm 03:09am, 08/24 Avg 68 bpm Rare PVCs Frequent PACs Atrial fibrillation: None Multiple runs of SVT, longest 11 seconds, fastest rate 197 for 8 beats  ASSESSMENT AND PLAN:  1.  Persistent atrial fibrillation: Currently on Xarelto and dofetilide with a CHA2DS2-VASc of 4.    2.  Nonischemic cardiomyopathy: Ejection fraction remains low at 30 to 35%.  She has a left bundle branch block appearing IVCD.  We Velvet Moomaw plan for CRT-P implant.  Risks and benefits of been discussed include bleeding, tamponade, infection, pneumothorax.  The patient understand these risks and has agreed to the procedure.  3.  Coronary artery  disease: Status post RCA stent in 2014.  No current chest pain.  Current medicines are reviewed at length with the patient today.   The patient does not have concerns regarding her medicines.  The following changes were made today: None  Labs/ tests ordered today include:  Orders Placed This Encounter  Procedures  . Basic metabolic panel  . CBC    Disposition:   FU with Shauntavia Brackin 2 months  Signed, Rolanda Campa Meredith Leeds, MD  10/26/2019 4:09 PM     [address] [place] [address] [phone] (office) [phone] (fax)

## 2019-10-27 LAB — BASIC METABOLIC PANEL
BUN/Creatinine Ratio: 18 (ref 12–28)
BUN: 17 mg/dL (ref 8–27)
CO2: 23 mmol/L (ref 20–29)
Calcium: 10.2 mg/dL (ref 8.7–10.3)
Chloride: 103 mmol/L (ref 96–106)
Creatinine, Ser: 0.96 mg/dL (ref 0.57–1.00)
GFR calc Af Amer: 61 mL/min/{1.73_m2} (ref >59)
GFR calc non Af Amer: 53 mL/min/{1.73_m2} — ABNORMAL LOW (ref >59)
Glucose: 97 mg/dL (ref 65–99)
Potassium: 4.8 mmol/L (ref 3.5–5.2)
Sodium: 139 mmol/L (ref 134–144)

## 2019-10-27 LAB — CBC
Hematocrit: 38.8 % (ref 34.0–46.6)
Hemoglobin: 13.7 g/dL (ref 11.1–15.9)
MCH: 32.8 pg (ref 26.6–33.0)
MCHC: 35.3 g/dL (ref 31.5–35.7)
MCV: 93 fL (ref 79–97)
Platelets: 290 10*3/uL (ref 150–450)
RBC: 4.18 x10E6/uL (ref 3.77–5.28)
RDW: 13 % (ref 11.7–15.4)
WBC: 8.7 10*3/uL (ref 3.4–10.8)

## 2019-10-28 ENCOUNTER — Telehealth: Payer: Self-pay

## 2019-10-28 LAB — CALPROTECTIN: Calprotectin: 12 mcg/g

## 2019-10-28 NOTE — Telephone Encounter (Signed)
Called the patient to inform her of her recent lab results. Patient has a pacemaker insertion scheduled for 11/13/19. Recent OV with Dr. Curt Bears on 10/26/19. Patient states that she would like to cancel her upcoming procedure because she is not ready to get her pacemaker. Patient reports having several concerns about her children that she is currently dealing with and feels that now is not the best time for this to happen. States she will consider rescheduling in the Spring.   Will forward to Dr. Curt Bears and his RN.

## 2019-10-28 NOTE — Telephone Encounter (Signed)
-----   Message from Will Meredith Leeds, MD sent at 10/27/2019 10:45 AM EDT ----- Stable preop labs

## 2019-11-07 DIAGNOSIS — I5022 Chronic systolic (congestive) heart failure: Secondary | ICD-10-CM | POA: Diagnosis not present

## 2019-11-07 DIAGNOSIS — I110 Hypertensive heart disease with heart failure: Secondary | ICD-10-CM | POA: Diagnosis not present

## 2019-11-07 DIAGNOSIS — I482 Chronic atrial fibrillation, unspecified: Secondary | ICD-10-CM | POA: Diagnosis not present

## 2019-11-11 ENCOUNTER — Other Ambulatory Visit (HOSPITAL_COMMUNITY): Payer: Medicare Other

## 2019-11-12 ENCOUNTER — Other Ambulatory Visit: Payer: Self-pay | Admitting: Cardiology

## 2019-11-12 ENCOUNTER — Other Ambulatory Visit (INDEPENDENT_AMBULATORY_CARE_PROVIDER_SITE_OTHER): Payer: Self-pay | Admitting: Internal Medicine

## 2019-11-12 ENCOUNTER — Ambulatory Visit: Payer: Medicare Other | Admitting: Cardiology

## 2019-11-13 ENCOUNTER — Ambulatory Visit (HOSPITAL_COMMUNITY): Admit: 2019-11-13 | Payer: Medicare Other | Admitting: Cardiology

## 2019-11-13 ENCOUNTER — Encounter (HOSPITAL_COMMUNITY): Payer: Self-pay

## 2019-11-13 SURGERY — BIV PACEMAKER INSERTION CRT-P

## 2019-11-18 DIAGNOSIS — Z6841 Body Mass Index (BMI) 40.0 and over, adult: Secondary | ICD-10-CM | POA: Diagnosis not present

## 2019-12-02 DIAGNOSIS — M25539 Pain in unspecified wrist: Secondary | ICD-10-CM | POA: Diagnosis not present

## 2019-12-02 DIAGNOSIS — M79643 Pain in unspecified hand: Secondary | ICD-10-CM | POA: Diagnosis not present

## 2019-12-02 DIAGNOSIS — Z6841 Body Mass Index (BMI) 40.0 and over, adult: Secondary | ICD-10-CM | POA: Diagnosis not present

## 2019-12-02 NOTE — Telephone Encounter (Signed)
Patient is returning call. She states she followed up with her PCP and she was advised to reschedule pacemaker insertion. She is requesting to speak with Dr. Macky Lower nurse to arrange this. Please call.

## 2019-12-02 NOTE — Telephone Encounter (Signed)
Established with a new PCP who told her that he read Dr. Macky Lower note and advised her that she should proceed with CRT-P implant. Pt would like to reschedule for 01/20/20. Aware I will be in touch at later date to schedule covid screening and procedure instructions. Pt agreeable to plan.

## 2019-12-10 ENCOUNTER — Other Ambulatory Visit: Payer: Self-pay | Admitting: Cardiology

## 2019-12-11 DIAGNOSIS — R200 Anesthesia of skin: Secondary | ICD-10-CM | POA: Diagnosis not present

## 2019-12-11 DIAGNOSIS — M79641 Pain in right hand: Secondary | ICD-10-CM | POA: Diagnosis not present

## 2019-12-11 DIAGNOSIS — M19041 Primary osteoarthritis, right hand: Secondary | ICD-10-CM | POA: Diagnosis not present

## 2019-12-11 DIAGNOSIS — M19031 Primary osteoarthritis, right wrist: Secondary | ICD-10-CM | POA: Diagnosis not present

## 2019-12-16 DIAGNOSIS — M25539 Pain in unspecified wrist: Secondary | ICD-10-CM | POA: Diagnosis not present

## 2019-12-16 DIAGNOSIS — M79643 Pain in unspecified hand: Secondary | ICD-10-CM | POA: Diagnosis not present

## 2019-12-16 DIAGNOSIS — I4891 Unspecified atrial fibrillation: Secondary | ICD-10-CM | POA: Diagnosis not present

## 2019-12-16 DIAGNOSIS — I509 Heart failure, unspecified: Secondary | ICD-10-CM | POA: Diagnosis not present

## 2019-12-22 ENCOUNTER — Telehealth: Payer: Self-pay | Admitting: *Deleted

## 2019-12-22 NOTE — Telephone Encounter (Signed)
Pt aware of scheduling change 01/20/20 at hospital, aware will need to move procedure date to February. Pt agreeable to 03/02/20 and aware I will call at later date to go over instructions. Patient verbalized understanding and agreeable to plan.

## 2019-12-30 ENCOUNTER — Telehealth: Payer: Self-pay | Admitting: *Deleted

## 2019-12-30 NOTE — Telephone Encounter (Signed)
Pt informed that 01/20/20 date is no longer available for CRT-P implant d/t hospital scheduling changes. Pt agreeable to rescheduling to 03/02/20. Aware I will be in touch after the holiday/s to go over instructions. Patient verbalized understanding and agreeable to plan.

## 2020-01-08 DIAGNOSIS — I428 Other cardiomyopathies: Secondary | ICD-10-CM | POA: Diagnosis not present

## 2020-01-08 DIAGNOSIS — I5022 Chronic systolic (congestive) heart failure: Secondary | ICD-10-CM | POA: Diagnosis not present

## 2020-01-08 DIAGNOSIS — I482 Chronic atrial fibrillation, unspecified: Secondary | ICD-10-CM | POA: Diagnosis not present

## 2020-01-08 DIAGNOSIS — I110 Hypertensive heart disease with heart failure: Secondary | ICD-10-CM | POA: Diagnosis not present

## 2020-01-10 ENCOUNTER — Other Ambulatory Visit: Payer: Self-pay | Admitting: Cardiology

## 2020-01-12 NOTE — Telephone Encounter (Signed)
This is a Nurse, mental health pt, Dr. Domenic Polite

## 2020-02-08 DIAGNOSIS — I110 Hypertensive heart disease with heart failure: Secondary | ICD-10-CM | POA: Diagnosis not present

## 2020-02-08 DIAGNOSIS — I482 Chronic atrial fibrillation, unspecified: Secondary | ICD-10-CM | POA: Diagnosis not present

## 2020-02-08 DIAGNOSIS — I428 Other cardiomyopathies: Secondary | ICD-10-CM | POA: Diagnosis not present

## 2020-02-08 DIAGNOSIS — I5022 Chronic systolic (congestive) heart failure: Secondary | ICD-10-CM | POA: Diagnosis not present

## 2020-02-16 ENCOUNTER — Telehealth: Payer: Self-pay | Admitting: *Deleted

## 2020-02-16 DIAGNOSIS — D6869 Other thrombophilia: Secondary | ICD-10-CM | POA: Diagnosis not present

## 2020-02-16 DIAGNOSIS — I509 Heart failure, unspecified: Secondary | ICD-10-CM | POA: Diagnosis not present

## 2020-02-16 DIAGNOSIS — Z01812 Encounter for preprocedural laboratory examination: Secondary | ICD-10-CM

## 2020-02-16 DIAGNOSIS — I4891 Unspecified atrial fibrillation: Secondary | ICD-10-CM | POA: Diagnosis not present

## 2020-02-16 DIAGNOSIS — I428 Other cardiomyopathies: Secondary | ICD-10-CM

## 2020-02-16 NOTE — Telephone Encounter (Signed)
Called pt to review upcoming procedure instructions. Pt will stop by [facility] for lab work this week/next. Covid screening scheduled for 2/21, pt aware to quarantine after screening. Aware I will send procedure instructions that we reviewed via mychart. Aware office will call to arrange post procedure follow up. Patient verbalized understanding and agreeable to plan.

## 2020-02-22 DIAGNOSIS — I428 Other cardiomyopathies: Secondary | ICD-10-CM | POA: Diagnosis not present

## 2020-02-22 DIAGNOSIS — Z01812 Encounter for preprocedural laboratory examination: Secondary | ICD-10-CM | POA: Diagnosis not present

## 2020-02-23 DIAGNOSIS — H353111 Nonexudative age-related macular degeneration, right eye, early dry stage: Secondary | ICD-10-CM | POA: Diagnosis not present

## 2020-02-23 LAB — CBC WITH DIFFERENTIAL/PLATELET
Absolute Monocytes: 724 cells/uL (ref 200–950)
Basophils Absolute: 39 cells/uL (ref 0–200)
Basophils Relative: 0.5 %
Eosinophils Absolute: 154 cells/uL (ref 15–500)
Eosinophils Relative: 2 %
HCT: 41.3 % (ref 35.0–45.0)
Hemoglobin: 14.3 g/dL (ref 11.7–15.5)
Lymphs Abs: 2295 cells/uL (ref 850–3900)
MCH: 31.6 pg (ref 27.0–33.0)
MCHC: 34.6 g/dL (ref 32.0–36.0)
MCV: 91.4 fL (ref 80.0–100.0)
MPV: 11.5 fL (ref 7.5–12.5)
Monocytes Relative: 9.4 %
Neutro Abs: 4489 cells/uL (ref 1500–7800)
Neutrophils Relative %: 58.3 %
Platelets: 285 10*3/uL (ref 140–400)
RBC: 4.52 10*6/uL (ref 3.80–5.10)
RDW: 13.8 % (ref 11.0–15.0)
Total Lymphocyte: 29.8 %
WBC: 7.7 10*3/uL (ref 3.8–10.8)

## 2020-02-23 LAB — BASIC METABOLIC PANEL
BUN: 20 mg/dL (ref 7–25)
CO2: 26 mmol/L (ref 20–32)
Calcium: 10.1 mg/dL (ref 8.6–10.4)
Chloride: 102 mmol/L (ref 98–110)
Creat: 0.84 mg/dL (ref 0.60–0.88)
Glucose, Bld: 96 mg/dL (ref 65–139)
Potassium: 4.6 mmol/L (ref 3.5–5.3)
Sodium: 138 mmol/L (ref 135–146)

## 2020-02-25 NOTE — Telephone Encounter (Signed)
[name] is calling requesting [name] call her to go over these instructions. Her best callback number is [phone]. Please advise.

## 2020-02-26 NOTE — Telephone Encounter (Signed)
Called pt, answered all questions. Pt aware to arrive at 11:30 am day of procedure (not 1230 as previously instructed) Patient verbalized understanding and agreeable to plan.

## 2020-02-29 ENCOUNTER — Other Ambulatory Visit (HOSPITAL_COMMUNITY)
Admission: RE | Admit: 2020-02-29 | Discharge: 2020-02-29 | Disposition: A | Discharge status: Home / Self Care | Payer: Medicare Other | Source: Ambulatory Visit | Attending: Cardiology | Admitting: Cardiology

## 2020-02-29 ENCOUNTER — Other Ambulatory Visit (HOSPITAL_COMMUNITY): Payer: Medicare Other

## 2020-02-29 DIAGNOSIS — Z20822 Contact with and (suspected) exposure to covid-19: Secondary | ICD-10-CM | POA: Diagnosis not present

## 2020-02-29 DIAGNOSIS — Z01812 Encounter for preprocedural laboratory examination: Secondary | ICD-10-CM | POA: Insufficient documentation

## 2020-02-29 LAB — SARS CORONAVIRUS 2 (TAT 6-24 HRS): SARS Coronavirus 2: NEGATIVE

## 2020-03-01 ENCOUNTER — Telehealth (INDEPENDENT_AMBULATORY_CARE_PROVIDER_SITE_OTHER): Payer: Self-pay | Admitting: Internal Medicine

## 2020-03-01 ENCOUNTER — Other Ambulatory Visit (INDEPENDENT_AMBULATORY_CARE_PROVIDER_SITE_OTHER): Payer: Self-pay | Admitting: Internal Medicine

## 2020-03-01 MED ORDER — BALSALAZIDE DISODIUM 750 MG PO CAPS: 2250.0000 mg | ORAL_CAPSULE | Freq: Three times a day (TID) | ORAL | 11 refills | Status: DC

## 2020-03-01 NOTE — Progress Notes (Addendum)
Instructed patient on the following items: Arrival time 1130 Nothing to eat or drink after midnight No meds AM of procedure Responsible person to drive you home and stay with you for 24 hrs Wash with special soap night before and morning of procedure If on anti-coagulant drug instructions Xarelto- hold tonight's dose

## 2020-03-01 NOTE — Telephone Encounter (Signed)
Note placed on Dr. Olevia Perches desk.

## 2020-03-01 NOTE — Telephone Encounter (Signed)
Patient called requesting medication for ulcerative colitis to be changed as requested by her insurance. Patient is on balsalazide which has worked well for her and she is tolerating it well. Patient states that she is allergic to sulfa.  She says she took it several years ago when she had a reaction. I therefore would not recommend changing her to sulfasalazine. Dipentum is also preferred drug on her plan which I would not recommend because side effect of diarrhea.  As a matter fact it is not used often because of the side effect. Prescription for balsalazide sent to patient's pharmacy. I asked her to call me if her co-pay is outrageous

## 2020-03-01 NOTE — Telephone Encounter (Signed)
Patient called the office stated she needs a refill on balsalazide - states her insurance will not pay for this any longer - wants to know if this can be replaced with something cheaper - please advise - ph# [phone]

## 2020-03-02 ENCOUNTER — Ambulatory Visit (HOSPITAL_COMMUNITY): Payer: Medicare Other

## 2020-03-02 ENCOUNTER — Ambulatory Visit (HOSPITAL_COMMUNITY)
Admission: RE | Admit: 2020-03-02 | Discharge: 2020-03-02 | Disposition: A | Discharge status: Home / Self Care | Payer: Medicare Other | Source: Ambulatory Visit | Attending: Cardiology | Admitting: Cardiology

## 2020-03-02 ENCOUNTER — Other Ambulatory Visit: Payer: Self-pay

## 2020-03-02 ENCOUNTER — Ambulatory Visit (HOSPITAL_COMMUNITY)
Admission: RE | Disposition: A | Discharge status: Home / Self Care | Payer: Self-pay | Source: Home / Self Care | Attending: Cardiology

## 2020-03-02 ENCOUNTER — Ambulatory Visit (HOSPITAL_COMMUNITY)
Admission: RE | Admit: 2020-03-02 | Discharge: 2020-03-02 | Disposition: A | Discharge status: Home / Self Care | Payer: Medicare Other | Attending: Cardiology | Admitting: Cardiology

## 2020-03-02 DIAGNOSIS — Z955 Presence of coronary angioplasty implant and graft: Secondary | ICD-10-CM | POA: Diagnosis not present

## 2020-03-02 DIAGNOSIS — Z7901 Long term (current) use of anticoagulants: Secondary | ICD-10-CM | POA: Insufficient documentation

## 2020-03-02 DIAGNOSIS — Z880 Allergy status to penicillin: Secondary | ICD-10-CM | POA: Insufficient documentation

## 2020-03-02 DIAGNOSIS — I2510 Atherosclerotic heart disease of native coronary artery without angina pectoris: Secondary | ICD-10-CM | POA: Insufficient documentation

## 2020-03-02 DIAGNOSIS — I447 Left bundle-branch block, unspecified: Secondary | ICD-10-CM | POA: Diagnosis not present

## 2020-03-02 DIAGNOSIS — I110 Hypertensive heart disease with heart failure: Secondary | ICD-10-CM | POA: Diagnosis not present

## 2020-03-02 DIAGNOSIS — Z95818 Presence of other cardiac implants and grafts: Secondary | ICD-10-CM | POA: Insufficient documentation

## 2020-03-02 DIAGNOSIS — I5022 Chronic systolic (congestive) heart failure: Secondary | ICD-10-CM | POA: Diagnosis not present

## 2020-03-02 DIAGNOSIS — I428 Other cardiomyopathies: Secondary | ICD-10-CM | POA: Insufficient documentation

## 2020-03-02 DIAGNOSIS — Z885 Allergy status to narcotic agent status: Secondary | ICD-10-CM | POA: Insufficient documentation

## 2020-03-02 DIAGNOSIS — I509 Heart failure, unspecified: Secondary | ICD-10-CM | POA: Diagnosis not present

## 2020-03-02 DIAGNOSIS — I517 Cardiomegaly: Secondary | ICD-10-CM | POA: Diagnosis not present

## 2020-03-02 DIAGNOSIS — I4819 Other persistent atrial fibrillation: Secondary | ICD-10-CM | POA: Insufficient documentation

## 2020-03-02 HISTORY — PX: BIV PACEMAKER INSERTION CRT-P: EP1199

## 2020-03-02 SURGERY — BIV PACEMAKER INSERTION CRT-P

## 2020-03-02 MED ORDER — FENTANYL CITRATE (PF) 100 MCG/2ML IJ SOLN
INTRAMUSCULAR | Status: AC
  Filled 2020-03-02: qty 2

## 2020-03-02 MED ORDER — CHLORHEXIDINE GLUCONATE 4 % EX LIQD: 60.0000 mL | Freq: Once | CUTANEOUS | Status: DC

## 2020-03-02 MED ORDER — FENTANYL CITRATE (PF) 100 MCG/2ML IJ SOLN
INTRAMUSCULAR | Status: DC | PRN
  Administered 2020-03-02 (×3): 25 ug via INTRAVENOUS

## 2020-03-02 MED ORDER — LIDOCAINE HCL 1 % IJ SOLN
INTRAMUSCULAR | Status: AC
  Filled 2020-03-02: qty 20

## 2020-03-02 MED ORDER — SODIUM CHLORIDE 0.9 % IV SOLN
80.0000 mg | INTRAVENOUS | Status: AC
  Administered 2020-03-02: 80 mg

## 2020-03-02 MED ORDER — ONDANSETRON HCL 4 MG/2ML IJ SOLN: 4.0000 mg | Freq: Four times a day (QID) | INTRAMUSCULAR | Status: DC | PRN

## 2020-03-02 MED ORDER — MIDAZOLAM HCL 5 MG/5ML IJ SOLN
INTRAMUSCULAR | Status: DC | PRN
  Administered 2020-03-02 (×3): 1 mg via INTRAVENOUS

## 2020-03-02 MED ORDER — SODIUM CHLORIDE 0.9 % IV SOLN: INTRAVENOUS | Status: DC

## 2020-03-02 MED ORDER — MIDAZOLAM HCL 5 MG/5ML IJ SOLN
INTRAMUSCULAR | Status: AC
  Filled 2020-03-02: qty 5

## 2020-03-02 MED ORDER — LIDOCAINE HCL 1 % IJ SOLN
INTRAMUSCULAR | Status: AC
  Filled 2020-03-02: qty 60

## 2020-03-02 MED ORDER — IOHEXOL 350 MG/ML SOLN
INTRAVENOUS | Status: DC | PRN
  Administered 2020-03-02: 5 mL

## 2020-03-02 MED ORDER — VANCOMYCIN HCL 1000 MG/200ML IV SOLN: 1000.0000 mg | Freq: Two times a day (BID) | INTRAVENOUS | Status: DC

## 2020-03-02 MED ORDER — VANCOMYCIN HCL IN DEXTROSE 1-5 GM/200ML-% IV SOLN
INTRAVENOUS | Status: AC
  Filled 2020-03-02: qty 200

## 2020-03-02 MED ORDER — LIDOCAINE HCL (PF) 1 % IJ SOLN
INTRAMUSCULAR | Status: DC | PRN
  Administered 2020-03-02: 60 mL

## 2020-03-02 MED ORDER — SODIUM CHLORIDE 0.9 % IV SOLN
INTRAVENOUS | Status: AC
  Filled 2020-03-02: qty 2

## 2020-03-02 MED ORDER — HEPARIN (PORCINE) IN NACL 1000-0.9 UT/500ML-% IV SOLN
INTRAVENOUS | Status: DC | PRN
  Administered 2020-03-02: 500 mL

## 2020-03-02 MED ORDER — VANCOMYCIN HCL 1500 MG/300ML IV SOLN
1500.0000 mg | INTRAVENOUS | Status: AC
  Administered 2020-03-02: 1500 mg via INTRAVENOUS
  Filled 2020-03-02: qty 300

## 2020-03-02 MED ORDER — ACETAMINOPHEN 325 MG PO TABS: 325.0000 mg | ORAL_TABLET | ORAL | Status: DC | PRN

## 2020-03-02 SURGICAL SUPPLY — 19 items
BALLN COR SINUS VENO 6FR 80 (BALLOONS) ×2
BALLOON COR SINUS VENO 6FR 80 (BALLOONS) IMPLANT
CABLE SURGICAL S-101-97-12 (CABLE) ×2 IMPLANT
CATH CPS DIRECT 135 DS2C020 (CATHETERS) ×1 IMPLANT
CATH CPS QUART CN DS2N029-65 (CATHETERS) ×1 IMPLANT
CATH JOSEPHSON QUAD-ALLRED 6FR (CATHETERS) ×1 IMPLANT
CPS IMPLANT KIT 410190 (MISCELLANEOUS) ×1 IMPLANT
KIT ESSENTIALS PG (KITS) ×1 IMPLANT
LEAD QUARTET 1456Q-86 (Lead) IMPLANT
LEAD TENDRIL MRI 52CM LPA1200M (Lead) ×1 IMPLANT
LEAD TENDRIL MRI 58CM LPA1200M (Lead) ×1 IMPLANT
PACEMAKER QUDR ALLR CRT PM3562 (Pacemaker) IMPLANT
PAD PRO RADIOLUCENT 2001M-C (PAD) ×2 IMPLANT
PMKR QUADRA ALLURE CRT PM3562 (Pacemaker) ×2 IMPLANT
QUARTET 1456Q-86 (Lead) ×2 IMPLANT
SHEATH 8FR PRELUDE SNAP 13 (SHEATH) ×2 IMPLANT
TRAY PACEMAKER INSERTION (PACKS) ×2 IMPLANT
WIRE ACUITY WHISPER EDS 4648 (WIRE) ×2 IMPLANT
WIRE HI TORQ VERSACORE-J 145CM (WIRE) ×1 IMPLANT

## 2020-03-02 NOTE — H&P (Signed)
Electrophysiology Office Note   Date:  03/02/2020   ID:  Jeree, Delcid Dec 14, 1930, MRN [id number]  PCP:  [place] Nation, MD  Cardiologist:  Domenic Polite Primary Electrophysiologist:  Ersel Wadleigh Meredith Leeds, MD    No chief complaint on file.    History of Present Illness: [name] is a 87 y.o. female who is being seen today for the evaluation of atrial fibrillation at the request of No ref. provider found. Presenting today for electrophysiology evaluation.    She has a history of atrial flutter, atrial fibrillation, coronary artery disease status post RCA stent, and nonischemic cardiomyopathy.  She has an ejection fraction of 25%.  She has plans for CRT-P implant.  Today, denies symptoms of palpitations, chest pain, shortness of breath, orthopnea, PND, lower extremity edema, claudication, dizziness, presyncope, syncope, bleeding, or neurologic sequela. The patient is tolerating medications without difficulties. Plan CRTD today.    Past Medical History:  Diagnosis Date  . Arthritis   . Atrial flutter ([place])   . Bladder cancer ([place])   . Coronary atherosclerosis of native coronary artery    a. BMS RCA May 2014 - [place] stent. b. Cath 04/2017 - patent stent, minimal CAD otherwise.  . Depression   . Essential hypertension   . GI bleeding 06/2017   a. melena/small bowel ulcer by capsule endo 06/2017.  Marland Kitchen Hyperlipemia   . Macular degeneration   . NICM (nonischemic cardiomyopathy) ([place])   . Persistent atrial fibrillation ([place])   . Ulcerative colitis    Past Surgical History:  Procedure Laterality Date  . ABDOMINAL HYSTERECTOMY    . APPENDECTOMY    . Bilateral knee replacements      2007, 2008  . BLADDER SURGERY    . CARDIOVERSION N/A 02/04/2017   Procedure: CARDIOVERSION;  Surgeon: Arnoldo Lenis, MD;  [facility];  Service: Endoscopy;  Laterality: N/A;  . CARDIOVERSION N/A 02/26/2017   Procedure: CARDIOVERSION;  Surgeon: Satira Sark, MD;   Location: [facility];  Service: Cardiovascular;  Laterality: N/A;  . CARDIOVERSION N/A 08/07/2017   Procedure: CARDIOVERSION;  Surgeon: Pixie Casino, MD;  Location: [place];  Service: Cardiovascular;  Laterality: N/A;  . COLONOSCOPY N/A 06/15/2015   Procedure: COLONOSCOPY;  Surgeon: Rogene Houston, MD;  [facility];  Service: Endoscopy;  Laterality: N/A;  210  . CYSTOSCOPY W/ RETROGRADES Bilateral 05/14/2016   Procedure: CYSTOSCOPY WITH BILATERAL RETROGRADE PYELOGRAM;  Surgeon: Raynelle Bring, MD;  Location: [facility];  Service: Urology;  Laterality: Bilateral;  GENERAL ANESTHESIA WITH PARALYSIS  . ESOPHAGOGASTRODUODENOSCOPY (EGD) WITH PROPOFOL N/A 06/14/2017   Procedure: ESOPHAGOGASTRODUODENOSCOPY (EGD) WITH PROPOFOL;  Surgeon: Rogene Houston, MD;  [facility];  Service: Endoscopy;  Laterality: N/A;  . GIVENS CAPSULE STUDY  06/14/2017   Procedure: GIVENS CAPSULE STUDY;  Surgeon: Rogene Houston, MD;  [facility];  Service: Endoscopy;;  . LEFT HEART CATHETERIZATION WITH CORONARY ANGIOGRAM N/A 10/30/2013   Procedure: LEFT HEART CATHETERIZATION WITH CORONARY ANGIOGRAM;  Surgeon: Burnell Blanks, MD;  Location[facility] C[facility];  Service: Cardiovascular;  Laterality: N/A;  . RIGHT/LEFT HEART CATH AND CORONARY ANGIOGRAPHY N/A 05/03/2017   Procedure: RIGHT/LEFT HEART CATH AND CORONARY ANGIOGRAPHY;  Surgeon: Leonie Man, MD;  Location: [facility] CV LAB;  Service: Cardiovascular;  Laterality: N/A;  . TEE WITHOUT CARDIOVERSION N/A 02/04/2017   Procedure: TRANSESOPHAGEAL ECHOCARDIOGRAM (TEE) WITH PROPOL;  Surgeon: Arnoldo Lenis, MD;  [facility];  Service: Endoscopy;  Laterality: N/A;  . TONSILLECTOMY    . TOTAL KNEE ARTHROPLASTY    . TRANSURETHRAL RESECTION OF BLADDER TUMOR N/A 05/14/2016   Procedure: TRANSURETHRAL RESECTION OF BLADDER TUMOR (TURBT);  Surgeon: Raynelle Bring, MD;  Location: [facility];  Service: Urology;  Laterality: N/A;  GENERAL  ANESTHESIA WITH PARALYSIS  . YAG LASER APPLICATION Bilateral 16/09/4501   Procedure: YAG LASER APPLICATION;  Surgeon: Williams Che, MD;  Location: [facility];  Service: Ophthalmology;  Laterality: Bilateral;     No current facility-administered medications for this encounter.    Allergies:   Sinus & allergy [chlorpheniramine-phenylephrine], Carvedilol, Demerol, and Penicillins   Social History:  The patient  reports that she has never smoked. She has never used smokeless tobacco. She reports that she does not drink alcohol and does not use drugs.   Family History:  The patient's family history includes CAD in her father.   ROS:  Please see the history of present illness.   Otherwise, review of systems is positive for none.   All other systems are reviewed and negative.   PHYSICAL EXAM: VS:  BP (!) 124/92   Pulse (!) 46   Temp 98.2 F (36.8 C) (Oral)   Ht 5' 3"  (1.6 m)   Wt 99.8 kg   SpO2 99%   BMI 38.97 kg/m  , BMI Body mass index is 38.97 kg/m. GEN: Well nourished, well developed, in no acute distress  HEENT: normal  Neck: no JVD, carotid bruits, or masses Cardiac: RRR; no murmurs, rubs, or gallops,no edema  Respiratory:  clear to auscultation bilaterally, normal work of breathing GI: soft, nontender, nondistended, + BS MS: no deformity or atrophy  Skin: warm and dry Neuro:  Strength and sensation are intact Psych: euthymic mood, full affect  Recent Labs: 06/11/2019: ALT 13 02/22/2020: BUN 20; Creat 0.84; Hemoglobin 14.3; Platelets 285; Potassium 4.6; Sodium 138    Lipid Panel  No results found for: CHOL, TRIG, HDL, CHOLHDL, VLDL, LDLCALC, LDLDIRECT   Wt Readings from Last 3 Encounters:  03/02/20 99.8 kg  10/26/19 102.9 kg  10/13/19 104.2 kg      Other studies Reviewed: Additional studies/ records that were reviewed today include: TTE 11/06/18 Review of the above records today demonstrates:   1. Left ventricular ejection fraction, by visual estimation, is 20 to  25%. The left ventricle has severely decreased function. There is mildly increased left ventricular hypertrophy. There is diffuse hypokinesis.  2. Abnormal septal motion consistent with left bundle branch block.  3. Left ventricular diastolic parameters are consistent with Grade I diastolic dysfunction (impaired relaxation).  4. Global right ventricle has normal systolic function.The right ventricular size is normal. No increase in right ventricular wall thickness.  5. Left atrial size was normal.  6. Right atrial size was normal.  7. Mild to moderate mitral annular calcification.  8. Moderate aortic valve annular calcification.  9. The mitral valve is grossly normal. Mild mitral valve regurgitation. 10. The tricuspid valve is grossly normal. Tricuspid valve regurgitation is trivial. 11. The aortic valve is tricuspid. Aortic valve regurgitation is not visualized. 12. The pulmonic valve was grossly normal. Pulmonic valve regurgitation is mild. 13. The inferior vena cava is normal in size with greater than 50% respiratory variability, suggesting right atrial pressure of 3 mmHg. 14. TR signal is inadequate for assessing pulmonary artery systolic pressure.  LHC 05/03/17  Hemodynamic findings consistent with mild secondary pulmonary hypertension.  Patient has severe nonischemic cardiomyopathy  Moderate to Severely reduced CO/CI  LV  end diastolic pressure is moderately elevated.  Mid RCA stent widely patent  There is mild (2+) mitral regurgitation.  [facility] 09/16/18 personally reviewed Max 197 bpm 09:12am, 08/24 Min 38 bpm 03:09am, 08/24 Avg 68 bpm Rare PVCs Frequent PACs Atrial fibrillation: None Multiple runs of SVT, longest 11 seconds, fastest rate 197 for 8 beats  ASSESSMENT AND PLAN:  1.  Persistent atrial fibrillation: Currently on Xarelto and dofetilide with a CHA2DS2-VASc of 4.    2.  Nonischemic cardiomyopathy: [name] has presented today for surgery, with the  diagnosis of CHF.  The various methods of treatment have been discussed with the patient and family. After consideration of risks, benefits and other options for treatment, the patient has consented to  Procedure(s): Pacemaker implant as a surgical intervention .  Risks include but not limited to bleeding, infection, pneumothorax, perforation, tamponade, vascular damage, renal failure, MI, stroke, death, and lead dislodgement . The patient's history has been reviewed, patient examined, no change in status, stable for surgery.  I have reviewed the patient's chart and labs.  Questions were answered to the patient's satisfaction.    [name] Shed Curt Bears, MD 03/02/2020 11:50 AM

## 2020-03-02 NOTE — Telephone Encounter (Signed)
Dr.Rehman has refilled this medication on 03/01/2020.

## 2020-03-02 NOTE — Progress Notes (Signed)
Dr Curt Bears called and stated CXR looked good and pt was good for discharge.Pt ambulated without difficulty or bleeding.   Discharged home with daughter who will drive and stay with pt x 24 hrs

## 2020-03-02 NOTE — Discharge Instructions (Signed)
After Your Pacemaker   . You have a English as a second language teacher  ACTIVITY . Do not lift your arm above shoulder height for 1 week after your procedure. After 7 days, you may progress as below.  . You should remove your sling 24 hours after your procedure, unless otherwise instructed by your provider.     Wednesday March 09, 2020  Thursday March 10, 2020 Friday March 11, 2020 Saturday March 12, 2020   . Do not lift, push, pull, or carry anything over 10 pounds with the affected arm until 6 weeks (Wednesday April 13, 2020 ) after your procedure.   . Do NOT DRIVE until you have been seen for your wound check, or as long as instructed by your healthcare provider.   . Ask your healthcare provider when you can go back to work   INCISION/Dressing . If you are on a blood thinner such as Coumadin, Xarelto, Eliquis, Plavix, or Pradaxa please confirm with your provider when this should be resumed. ***  . If large square, outer bandage is left in place, this can be removed after 24 hours from your procedure. Do not remove steri-strips or glue as below.   . Monitor your Pacemaker site for redness, swelling, and drainage. Call the device clinic at [phone] if you experience these symptoms or fever/chills.  . {Blank single:19197::"If your incision is sealed with Steri-strips or staples, you may shower 10 days after your procedure or when told by your provider. Do not remove the steri-strips or let the shower hit directly on your site. You may wash around your site with soap and water","If your incision is closed with Dermabond/Surgical glue. You may shower 1 day after your pacemaker implant and wash around the site with soap and water"}.    . Avoid lotions, ointments, or perfumes over your incision until it is well-healed.  . You may use a hot tub or a pool AFTER your wound check appointment if the incision is completely closed.  Marland Kitchen PAcemaker Alerts:  Some alerts are vibratory and others beep. These are NOT  emergencies. Please call our office to let us know. If this occurs at night or on weekends, it can wait until the next business day. Send a remote transmission.  . If your device is capable of reading fluid status (for heart failure), you will be offered monthly monitoring to review this with you.   DEVICE MANAGEMENT . Remote monitoring is used to monitor your pacemaker from home. This monitoring is scheduled every 91 days by our office. It allows [place] to keep an eye on the functioning of your device to ensure it is working properly. You will routinely see your Electrophysiologist annually (more often if necessary).   . You should receive your ID card for your new device in 4-8 weeks. Keep this card with you at all times once received. Consider wearing a medical alert bracelet or necklace.  . Your Pacemaker may be MRI compatible. This will be discussed at your next office visit/wound check.  You should avoid contact with strong electric or magnetic fields.    Do not use amateur (ham) radio equipment or electric (arc) welding torches. MP3 player headphones with magnets should not be used. Some devices are safe to use if held at least 12 inches (30 cm) from your Pacemaker. These include power tools, lawn mowers, and speakers. If you are unsure if something is safe to use, ask your health care provider.   When using your cell phone, hold  it to the ear that is on the opposite side from the Pacemaker. Do not leave your cell phone in a pocket over the Pacemaker.   You may safely use electric blankets, heating pads, computers, and microwave ovens.  Call the office right away if:  You have chest pain.  You feel more short of breath than you have felt before.  You feel more light-headed than you have felt before.  Your incision starts to open up.  This information is not intended to replace advice given to you by your health care provider. Make sure you discuss any questions you have with your  health care provider.

## 2020-03-03 ENCOUNTER — Encounter (HOSPITAL_COMMUNITY): Payer: Self-pay | Admitting: Cardiology

## 2020-03-03 MED FILL — Lidocaine HCl Local Inj 1%: INTRAMUSCULAR | Qty: 60 | Status: AC

## 2020-03-03 MED FILL — Lidocaine HCl Local Inj 1%: INTRAMUSCULAR | Qty: 20 | Status: AC

## 2020-03-07 ENCOUNTER — Telehealth: Payer: Self-pay | Admitting: *Deleted

## 2020-03-07 ENCOUNTER — Telehealth: Payer: Self-pay

## 2020-03-07 DIAGNOSIS — I428 Other cardiomyopathies: Secondary | ICD-10-CM | POA: Diagnosis not present

## 2020-03-07 DIAGNOSIS — I5022 Chronic systolic (congestive) heart failure: Secondary | ICD-10-CM | POA: Diagnosis not present

## 2020-03-07 DIAGNOSIS — I110 Hypertensive heart disease with heart failure: Secondary | ICD-10-CM | POA: Diagnosis not present

## 2020-03-07 DIAGNOSIS — I482 Chronic atrial fibrillation, unspecified: Secondary | ICD-10-CM | POA: Diagnosis not present

## 2020-03-07 NOTE — Telephone Encounter (Signed)
Called pt to discuss Entresto cost per Dr. Curt Bears. Pt reports that it is costing her insurance over $600/month.  This is going to send her into the "doughnut hole" quickly. Pt informed that I would send Entresto assistance information via mychart (she does not have paper/pen handy on phone).  She understands to reach out to [employer] to discuss cost. She is agreeable to plan and appreciates our guidance on this matter.  Will forward to our prior auth dept for their FYI.Marland KitchenMarland KitchenMarland Kitchen

## 2020-03-07 NOTE — Telephone Encounter (Signed)
The pt monitor was flashing lights and making noise. I assured her it was a monitor issue and the pacemaker is fine.  She did call [facility] and one piece was not on the monitor and they plug it in correctly. I told them to write down all the questions they may have and bring it to the wound check for the nurse to review. I also told the daughter [name] to have her mom add her to the dpr or we can not give her any information.

## 2020-03-15 ENCOUNTER — Other Ambulatory Visit: Payer: Self-pay

## 2020-03-15 ENCOUNTER — Ambulatory Visit (INDEPENDENT_AMBULATORY_CARE_PROVIDER_SITE_OTHER): Payer: Medicare Other | Admitting: Emergency Medicine

## 2020-03-15 DIAGNOSIS — I428 Other cardiomyopathies: Secondary | ICD-10-CM | POA: Diagnosis not present

## 2020-03-15 LAB — CUP PACEART INCLINIC DEVICE CHECK
Battery Remaining Longevity: 36 mo
Battery Voltage: 2.96 V
Brady Statistic RA Percent Paced: 52 %
Brady Statistic RV Percent Paced: 97 %
Date Time Interrogation Session: 20220308151115
Implantable Lead Implant Date: 20220223
Implantable Lead Implant Date: 20220223
Implantable Lead Implant Date: 20220223
Implantable Lead Location: 753858
Implantable Lead Location: 753859
Implantable Lead Location: 753860
Implantable Pulse Generator Implant Date: 20220223
Lead Channel Impedance Value: 437.5 Ohm
Lead Channel Impedance Value: 512.5 Ohm
Lead Channel Impedance Value: 587.5 Ohm
Lead Channel Pacing Threshold Amplitude: 0.5 V
Lead Channel Pacing Threshold Amplitude: 0.75 V
Lead Channel Pacing Threshold Amplitude: 1.75 V
Lead Channel Pacing Threshold Amplitude: 1.75 V
Lead Channel Pacing Threshold Pulse Width: 0.5 ms
Lead Channel Pacing Threshold Pulse Width: 0.5 ms
Lead Channel Pacing Threshold Pulse Width: 0.8 ms
Lead Channel Pacing Threshold Pulse Width: 0.8 ms
Lead Channel Sensing Intrinsic Amplitude: 12 mV
Lead Channel Sensing Intrinsic Amplitude: 3.8 mV
Lead Channel Setting Pacing Amplitude: 3.5 V
Lead Channel Setting Pacing Amplitude: 3.5 V
Lead Channel Setting Pacing Amplitude: 3.5 V
Lead Channel Setting Pacing Pulse Width: 0.5 ms
Lead Channel Setting Pacing Pulse Width: 0.8 ms
Lead Channel Setting Sensing Sensitivity: 2 mV
Pulse Gen Model: 3562
Pulse Gen Serial Number: 3861599

## 2020-03-15 NOTE — Progress Notes (Signed)
Wound check appointment. Steri-strips removed. Wound without redness.  Large firm Hematoma noted at implant site, observed and confirmed by Dr. Quentin Ore in office.   Incision edges approximated, wound well healed. Normal device function. Thresholds, sensing, and impedances consistent with implant measurements. Device programmed at 3.5V for extra safety margin until 3 month visit. Histogram distribution appropriate for patient and level of activity. Previously noted AF, resolved as of 03/04/20, no new episodes.  AT/AF Burden 8.1% (decreased from 100% at previous report) +OAC.  No high ventricular rates noted. Patient is BiV Pacing 97% of the time.  Patient educated about wound care, arm mobility, lifting restrictions. ROV with Dr. Curt Bears on 06/03/20.  Patient is enrolled in remote monitoring, next scheduled check 06/07/20.

## 2020-03-21 ENCOUNTER — Telehealth: Payer: Self-pay | Admitting: Emergency Medicine

## 2020-03-21 NOTE — Telephone Encounter (Signed)
The pt is sending the transmission. I gave her verbal instructions. She states she felt horrible and weak Saturday and Sunday. She do feel better today. She states she could feel her heart beat and she usually cannot tell. I told her the nurse will review the transmission and give her a call back.

## 2020-03-21 NOTE — Telephone Encounter (Signed)
Called and spoke with patient, she is aware of appt 03/29/2020 at 1:30 pm with Adline Peals, PA-C.

## 2020-03-21 NOTE — Telephone Encounter (Signed)
Patient called back, would like for someone to call her back regarding an alert saw on her transmission

## 2020-03-21 NOTE — Telephone Encounter (Signed)
Patient reports if increased fatigue and overall not feeling well over the weekend. Patient states today she feels better (currently is back in rhythm). AT/AF burden 56%. Compliant with medications. Advised patient I will forward to AF clinic for referral and someone will reach out to her.

## 2020-03-21 NOTE — Telephone Encounter (Signed)
Attempted to contact patient. Unable to get an answer on patient's home phone. Patient had [facility] alert for ongoing A-fib event on 03/19/20. Contacted patient by cell phone. Patient is on the other line currently and advised that she will send a manual transmission. I advised patient that I would call her back to follow up.

## 2020-03-29 ENCOUNTER — Ambulatory Visit (HOSPITAL_COMMUNITY)
Admission: RE | Admit: 2020-03-29 | Discharge: 2020-03-29 | Disposition: A | Discharge status: Home / Self Care | Payer: Medicare Other | Source: Ambulatory Visit | Attending: Physician Assistant | Admitting: Physician Assistant

## 2020-03-29 ENCOUNTER — Other Ambulatory Visit: Payer: Self-pay

## 2020-03-29 ENCOUNTER — Encounter (HOSPITAL_COMMUNITY): Payer: Self-pay | Admitting: Physician Assistant

## 2020-03-29 VITALS — BP 128/72 | HR 69 | Ht 63.0 in | Wt 216.0 lb

## 2020-03-29 DIAGNOSIS — I4892 Unspecified atrial flutter: Secondary | ICD-10-CM | POA: Insufficient documentation

## 2020-03-29 DIAGNOSIS — D6869 Other thrombophilia: Secondary | ICD-10-CM | POA: Diagnosis not present

## 2020-03-29 DIAGNOSIS — Z6838 Body mass index (BMI) 38.0-38.9, adult: Secondary | ICD-10-CM | POA: Insufficient documentation

## 2020-03-29 DIAGNOSIS — I2510 Atherosclerotic heart disease of native coronary artery without angina pectoris: Secondary | ICD-10-CM | POA: Diagnosis not present

## 2020-03-29 DIAGNOSIS — E785 Hyperlipidemia, unspecified: Secondary | ICD-10-CM | POA: Insufficient documentation

## 2020-03-29 DIAGNOSIS — K5190 Ulcerative colitis, unspecified, without complications: Secondary | ICD-10-CM | POA: Diagnosis not present

## 2020-03-29 DIAGNOSIS — Z79899 Other long term (current) drug therapy: Secondary | ICD-10-CM | POA: Insufficient documentation

## 2020-03-29 DIAGNOSIS — I428 Other cardiomyopathies: Secondary | ICD-10-CM | POA: Insufficient documentation

## 2020-03-29 DIAGNOSIS — Z7901 Long term (current) use of anticoagulants: Secondary | ICD-10-CM | POA: Insufficient documentation

## 2020-03-29 DIAGNOSIS — E669 Obesity, unspecified: Secondary | ICD-10-CM | POA: Diagnosis not present

## 2020-03-29 DIAGNOSIS — I10 Essential (primary) hypertension: Secondary | ICD-10-CM | POA: Insufficient documentation

## 2020-03-29 DIAGNOSIS — I4819 Other persistent atrial fibrillation: Secondary | ICD-10-CM

## 2020-03-29 NOTE — Progress Notes (Signed)
Primary Care Physician: [place] Nation, MD Primary Cardiologist: Dr Domenic Polite  Primary Electrophysiologist: Dr Curt Bears Referring Physician: Dr Everette Rank is a 87 y.o. female with a history of atrial flutter, atrial fibrillation, CAD, NICM s/p CRT-P, HTN, HLD, ulcerative colitis who presents for follow up in the [facility]. Last seen by Roderic Palau 2019. The patient has been maintained on dofetilide for rhythm control. Patient is on Xarelto for a CHADS2VASC score of 6. The device clinic received an alert for an afib episode on 03/17/20 lasting about 2 days. Patient does report she felt more fatigued during that episode. She is back in SR. She has been changing her diet to lower her A1C and has lost several pounds. She denies any bleeding issues on anticoagulation.   Today, she denies symptoms of palpitations, chest pain, shortness of breath, orthopnea, PND, lower extremity edema, dizziness, presyncope, syncope, snoring, daytime somnolence, bleeding, or neurologic sequela. The patient is tolerating medications without difficulties and is otherwise without complaint today.    Atrial Fibrillation Risk Factors:  she does not have symptoms or diagnosis of sleep apnea. she does not have a history of rheumatic fever.   she has a BMI of Body mass index is 38.26 kg/m.Marland Kitchen Filed Weights   03/29/20 1321  Weight: 98 kg    Family History  Problem Relation Age of Onset  . CAD Father      Atrial Fibrillation Management history:  Previous antiarrhythmic drugs: amiodarone, dofetilide  Previous cardioversions: 2019 x3 Previous ablations: none CHADS2VASC score: 6 Anticoagulation history: Xarelto   Past Medical History:  Diagnosis Date  . Arthritis   . Atrial flutter ([place])   . Bladder cancer ([place])   . Coronary atherosclerosis of native coronary artery    a. BMS RCA May 2014 - Luis M. Cintron stent. b. Cath 04/2017 - patent stent, minimal CAD  otherwise.  . Depression   . Essential hypertension   . GI bleeding 06/2017   a. melena/small bowel ulcer by capsule endo 06/2017.  Marland Kitchen Hyperlipemia   . Macular degeneration   . NICM (nonischemic cardiomyopathy) ([place])   . Persistent atrial fibrillation ([place])   . Ulcerative colitis    Past Surgical History:  Procedure Laterality Date  . ABDOMINAL HYSTERECTOMY    . APPENDECTOMY    . Bilateral knee replacements      2007, 2008  . BIV PACEMAKER INSERTION CRT-P N/A 03/02/2020   Procedure: BIV PACEMAKER INSERTION CRT-P;  Surgeon: Constance Haw, MD;  Location: [facility];  Service: Cardiovascular;  Laterality: N/A;  . BLADDER SURGERY    . CARDIOVERSION N/A 02/04/2017   Procedure: CARDIOVERSION;  Surgeon: Arnoldo Lenis, MD;  [facility];  Service: Endoscopy;  Laterality: N/A;  . CARDIOVERSION N/A 02/26/2017   Procedure: CARDIOVERSION;  Surgeon: Satira Sark, MD;  Location: [facility];  Service: Cardiovascular;  Laterality: N/A;  . CARDIOVERSION N/A 08/07/2017   Procedure: CARDIOVERSION;  Surgeon: Pixie Casino, MD;  Location: [facility];  Service: Cardiovascular;  Laterality: N/A;  . COLONOSCOPY N/A 06/15/2015   Procedure: COLONOSCOPY;  Surgeon: Rogene Houston, MD;  [facility];  Service: Endoscopy;  Laterality: N/A;  210  . CYSTOSCOPY W/ RETROGRADES Bilateral 05/14/2016   Procedure: CYSTOSCOPY WITH BILATERAL RETROGRADE PYELOGRAM;  Surgeon: Raynelle Bring, MD;  Location: [facility];  Service: Urology;  Laterality: Bilateral;  GENERAL ANESTHESIA WITH PARALYSIS  . ESOPHAGOGASTRODUODENOSCOPY (EGD) WITH PROPOFOL N/A 06/14/2017   Procedure:  ESOPHAGOGASTRODUODENOSCOPY (EGD) WITH PROPOFOL;  Surgeon: Rogene Houston, MD;  [facility];  Service: Endoscopy;  Laterality: N/A;  . GIVENS CAPSULE STUDY  06/14/2017   Procedure: GIVENS CAPSULE STUDY;  Surgeon: Rogene Houston, MD;  [facility];  Service: Endoscopy;;  . LEFT HEART CATHETERIZATION  WITH CORONARY ANGIOGRAM N/A 10/30/2013   Procedure: LEFT HEART CATHETERIZATION WITH CORONARY ANGIOGRAM;  Surgeon: Burnell Blanks, MD;  Location: [facility];  Service: Cardiovascular;  Laterality: N/A;  . RIGHT/LEFT HEART CATH AND CORONARY ANGIOGRAPHY N/A 05/03/2017   Procedure: RIGHT/LEFT HEART CATH AND CORONARY ANGIOGRAPHY;  Surgeon: Leonie Man, MD;  Location: [facility];  Service: Cardiovascular;  Laterality: N/A;  . TEE WITHOUT CARDIOVERSION N/A 02/04/2017   Procedure: TRANSESOPHAGEAL ECHOCARDIOGRAM (TEE) WITH PROPOL;  Surgeon: Arnoldo Lenis, MD;  [facility];  Service: Endoscopy;  Laterality: N/A;  . TONSILLECTOMY    . TOTAL KNEE ARTHROPLASTY    . TRANSURETHRAL RESECTION OF BLADDER TUMOR N/A 05/14/2016   Procedure: TRANSURETHRAL RESECTION OF BLADDER TUMOR (TURBT);  Surgeon: Raynelle Bring, MD;  Location: [facility];  Service: Urology;  Laterality: N/A;  GENERAL ANESTHESIA WITH PARALYSIS  . YAG LASER APPLICATION Bilateral 11/16/4172   Procedure: YAG LASER APPLICATION;  Surgeon: Williams Che, MD;  Location: [facility];  Service: Ophthalmology;  Laterality: Bilateral;    Current Outpatient Medications  Medication Sig Dispense Refill  . acetaminophen (TYLENOL) 500 MG tablet Take 1,000 mg by mouth 3 (three) times daily with meals.    Marland Kitchen atorvastatin (LIPITOR) 20 MG tablet Take 1 tablet (20 mg total) by mouth at bedtime. 90 tablet 3  . balsalazide (COLAZAL) 750 MG capsule Take 3 capsules (2,250 mg total) by mouth 3 (three) times daily. 270 capsule 11  . Carboxymethylcellulose Sodium (THERATEARS) 0.25 % SOLN Place 1 drop into both eyes in the morning and at bedtime.    . Cholecalciferol (VITAMIN D-3) 25 MCG (1000 UT) CAPS Take 2,000 Units by mouth daily.     Marland Kitchen CRANBERRY PO Take 4,200 mg by mouth daily. With vit C    . dofetilide (TIKOSYN) 250 MCG capsule Take 1 capsule (250 mcg total) by mouth 2 (two) times daily. 60 capsule 11  . ENTRESTO 24-26 MG TAKE 1 TABLET BY  MOUTH TWICE DAILY - start ENTRESTO 48 HOURS AFTER STOPPING LISINOPRIL 60 tablet 6  . furosemide (LASIX) 20 MG tablet TAKE TWO TABLETS BY MOUTH TWICE DAILY 120 tablet 3  . Multiple Vitamins-Minerals (ICAPS AREDS 2 PO) Take 2 tablets by mouth daily.    . nitroGLYCERIN (NITROSTAT) 0.4 MG SL tablet Place 0.4 mg under the tongue every 5 (five) minutes as needed for chest pain.    . potassium chloride SA (KLOR-CON) 20 MEQ tablet Take 1 tablet (20 mEq total) by mouth 3 (three) times daily. 90 tablet 6  . sodium chloride (MURO 128) 2 % ophthalmic solution Place 1 drop into both eyes in the morning and at bedtime.    Marland Kitchen spironolactone (ALDACTONE) 25 MG tablet TAKE 1/2 TABLET BY MOUTH EVERY DAY 45 tablet 3  . XARELTO 20 MG TABS tablet TAKE 1 TABLET BY MOUTH DAILY WITH SUPPER (Patient taking differently: High risk med: Anticoagulant.  Crushed Xarelto can be given down a G-tube but NOT a J-Tube.) 30 tablet 6   No current facility-administered medications for this encounter.    Allergies  Allergen Reactions  . Sinus & Allergy [Chlorpheniramine-Phenylephrine] Shortness Of Breath  . Carvedilol Itching and Rash  Facial rash/itching  . Demerol Rash  . Penicillins Rash and Other (See Comments)    REACTION: rash, years ago Has patient had a PCN reaction causing immediate rash, facial/tongue/throat swelling, SOB or lightheadedness with hypotension: Yes Has patient had a PCN reaction causing severe rash involving mucus membranes or skin necrosis: No Has patient had a PCN reaction that required hospitalization No Has patient had a PCN reaction occurring within the last 10 years: No If all of the above answers are "NO", then may proceed with Cephalosporin use.     Social History   Socioeconomic History  . Marital status: Married    Spouse name: Not on file  . Number of children: Not on file  . Years of education: Not on file  . Highest education level: Not on file  Occupational History  . Not on file   Tobacco Use  . Smoking status: Never Smoker  . Smokeless tobacco: Never Used  Vaping Use  . Vaping Use: Never used  Substance and Sexual Activity  . Alcohol use: No    Alcohol/week: 0.0 standard drinks  . Drug use: No  . Sexual activity: Not on file  Other Topics Concern  . Not on file  Social History Narrative  . Not on file   Social Determinants of Health   Financial Resource Strain: Not on file  Food Insecurity: Not on file  Transportation Needs: Not on file  Physical Activity: Not on file  Stress: Not on file  Social Connections: Not on file  Intimate Partner Violence: Not on file     ROS- All systems are reviewed and negative except as per the HPI above.  Physical Exam: Vitals:   03/29/20 1321  BP: 128/72  Pulse: 69  Weight: 98 kg  Height: 5' 3"  (1.6 m)    GEN- The patient is a well appearing obese elderly female, alert and oriented x 3 today.   Head- normocephalic, atraumatic Eyes-  Sclera clear, conjunctiva pink Ears- hearing intact Oropharynx- clear Neck- supple  Lungs- Clear to ausculation bilaterally, normal work of breathing Heart- Regular rate and rhythm, no murmurs, rubs or gallops  GI- soft, NT, ND, + BS Extremities- no clubbing, cyanosis, or edema MS- no significant deformity or atrophy Skin- no rash or lesion Psych- euthymic mood, full affect Neuro- strength and sensation are intact  Wt Readings from Last 3 Encounters:  03/29/20 98 kg  03/02/20 99.8 kg  10/26/19 102.9 kg    EKG today demonstrates  AV dual paced rhythm  Vent. rate 69 BPM PR interval 166 ms QRS duration 162 ms QT/QTc 476/510 ms  Echo 03/25/19 demonstrated  1. Endocardium poorly visualized, difficult to assess LV function.  Recommend limited contrast study to better evaluate LVEF, 30-35% is rough  estimate. . Left ventricular ejection fraction, by estimation, is 30-35%.  The left ventricle has moderate to  severely decreased function. The left ventricle demonstrates  global  hypokinesis. Left ventricular diastolic parameters are consistent with  Grade I diastolic dysfunction (impaired relaxation).  2. Right ventricular systolic function is normal. The right ventricular  size is normal. There is normal pulmonary artery systolic pressure.  3. Left atrial size was severely dilated.  4. Right atrial size was mildly dilated.  5. The mitral valve is normal in structure. No evidence of mitral valve  regurgitation. No evidence of mitral stenosis.  6. The aortic valve is tricuspid. Aortic valve regurgitation is not  visualized. No aortic stenosis is present.  7. The inferior vena  cava is normal in size with greater than 50%  respiratory variability, suggesting right atrial pressure of 3 mmHg.   Comparison(s): Echocardiogram done 11/06/18 showed an EF of 25%.  Epic records are reviewed at length today  CHA2DS2-VASc Score = 6  The patient's score is based upon: CHF History: Yes HTN History: Yes Diabetes History: No Stroke History: No Vascular Disease History: Yes Age Score: 2 Gender Score: 1      ASSESSMENT AND PLAN: 1. Persistent Atrial Fibrillation (ICD10:  I48.19) The patient's CHA2DS2-VASc score is 6, indicating a 9.7% annual risk of stroke.   Patient back in SR. Continue to monitor burden on device. Continue dofetilide 250 mcg BID. QT stable Continue Xarelto 20 mg daily  2. Secondary Hypercoagulable State (ICD10:  D68.69) The patient is at significant risk for stroke/thromboembolism based upon her CHA2DS2-VASc Score of 6.  Continue Rivaroxaban (Xarelto).   3. Obesity Body mass index is 38.26 kg/m. Lifestyle modification was discussed at length including regular exercise and weight reduction. Patient has done well with this.  4. CAD No anginal symptoms.  5. NICM EF 30-65% S/p CRT-P No signs or symptoms of fluid overload today.  6. HTN Stable, no changes today.   Follow up with Dr Curt Bears as scheduled.    [facility] [address] [place], [place] [address] [phone] 03/29/2020 1:31 PM

## 2020-04-04 ENCOUNTER — Other Ambulatory Visit: Payer: Self-pay | Admitting: Cardiology

## 2020-04-05 DIAGNOSIS — I4891 Unspecified atrial fibrillation: Secondary | ICD-10-CM | POA: Diagnosis not present

## 2020-04-05 DIAGNOSIS — D6869 Other thrombophilia: Secondary | ICD-10-CM | POA: Diagnosis not present

## 2020-04-05 DIAGNOSIS — I428 Other cardiomyopathies: Secondary | ICD-10-CM | POA: Diagnosis not present

## 2020-04-05 DIAGNOSIS — I509 Heart failure, unspecified: Secondary | ICD-10-CM | POA: Diagnosis not present

## 2020-04-06 DIAGNOSIS — I482 Chronic atrial fibrillation, unspecified: Secondary | ICD-10-CM | POA: Diagnosis not present

## 2020-04-06 DIAGNOSIS — I5022 Chronic systolic (congestive) heart failure: Secondary | ICD-10-CM | POA: Diagnosis not present

## 2020-04-06 DIAGNOSIS — I110 Hypertensive heart disease with heart failure: Secondary | ICD-10-CM | POA: Diagnosis not present

## 2020-04-06 DIAGNOSIS — I428 Other cardiomyopathies: Secondary | ICD-10-CM | POA: Diagnosis not present

## 2020-04-12 ENCOUNTER — Other Ambulatory Visit: Payer: Self-pay

## 2020-04-12 ENCOUNTER — Ambulatory Visit (INDEPENDENT_AMBULATORY_CARE_PROVIDER_SITE_OTHER): Payer: Medicare Other | Admitting: Internal Medicine

## 2020-04-12 ENCOUNTER — Encounter (INDEPENDENT_AMBULATORY_CARE_PROVIDER_SITE_OTHER): Payer: Self-pay | Admitting: Internal Medicine

## 2020-04-12 VITALS — BP 104/68 | HR 90 | Temp 98.0°F | Ht 63.0 in | Wt 215.0 lb

## 2020-04-12 DIAGNOSIS — K5100 Ulcerative (chronic) pancolitis without complications: Secondary | ICD-10-CM | POA: Diagnosis not present

## 2020-04-12 NOTE — Progress Notes (Signed)
Presenting complaint;  Follow-up for ulcerative colitis.  Database and subjective:  Patient is 87 year old Caucasian female who was 15-year history of pan ulcerative colitis who is here for scheduled visit.  She was last seen in October 2021.  She states she had pacemaker placed on 03/02/2020.  She says she is doing well. Her appetite is good but she is trying to lose weight since she was told by Dr. Jimmye Norman that she is prediabetic.  She has lost 11 pounds in 6 weeks.  She has anywhere from 1-5 stools per day.  All of her stools are formed.  Once in a while she may go a day without a bowel movement.  She denies abdominal pain melena or rectal bleeding. She has surveillance colonoscopy in June 2017 with removal of 5 small tubular adenomas.  UC was in remission.  Current Medications: Outpatient Encounter Medications as of 04/12/2020  Medication Sig  . acetaminophen (TYLENOL) 500 MG tablet Take 1,000 mg by mouth 3 (three) times daily with meals.  Marland Kitchen atorvastatin (LIPITOR) 20 MG tablet TAKE 1 TABLET BY MOUTH AT BEDTIME  . balsalazide (COLAZAL) 750 MG capsule Take 3 capsules (2,250 mg total) by mouth 3 (three) times daily.  . Carboxymethylcellulose Sodium (THERATEARS) 0.25 % SOLN Place 1 drop into both eyes in the morning and at bedtime.  . Cholecalciferol (VITAMIN D-3) 25 MCG (1000 UT) CAPS Take 2,000 Units by mouth daily.   Marland Kitchen CRANBERRY PO Take 4,200 mg by mouth daily. With vit C  . dofetilide (TIKOSYN) 250 MCG capsule TAKE ONE CAPSULE BY MOUTH TWICE DAILY  . ENTRESTO 24-26 MG TAKE 1 TABLET BY MOUTH TWICE DAILY - start ENTRESTO 48 HOURS AFTER STOPPING LISINOPRIL  . furosemide (LASIX) 20 MG tablet TAKE TWO TABLETS BY MOUTH TWICE DAILY  . Multiple Vitamins-Minerals (ICAPS AREDS 2 PO) Take 2 tablets by mouth daily.  . nitroGLYCERIN (NITROSTAT) 0.4 MG SL tablet Place 0.4 mg under the tongue every 5 (five) minutes as needed for chest pain.  . potassium chloride SA (KLOR-CON) 20 MEQ tablet Take 1 tablet  (20 mEq total) by mouth 3 (three) times daily.  . sodium chloride (MURO 128) 2 % ophthalmic solution Place 1 drop into both eyes in the morning and at bedtime.  Marland Kitchen spironolactone (ALDACTONE) 25 MG tablet TAKE 1/2 TABLET BY MOUTH EVERY DAY  . XARELTO 20 MG TABS tablet TAKE 1 TABLET BY MOUTH DAILY WITH SUPPER   No facility-administered encounter medications on file as of 04/12/2020.     Objective: Blood pressure 104/68, pulse 90, temperature 98 F (36.7 C), temperature source Oral, height _0  (1.6 m), weight 215 lb (97.5 kg). Patient is alert and in no acute distress. She is wearing a mask. Conjunctiva is pink. Sclera is nonicteric Oropharyngeal mucosa is normal. No neck masses or thyromegaly noted. Cardiac exam with regular rhythm normal S1 and S2. No murmur or gallop noted. Lungs are clear to auscultation. Abdomen is full.  She has lower midline and right lower quadrant abdominal scars.  On palpation abdomen is soft and nontender with organomegaly or masses. No LE edema or clubbing noted.  Labs/studies Results:  CBC Latest Ref Rng & Units 02/22/2020 10/26/2019 06/11/2019  WBC 3.8 - 10.8 Thousand/uL 7.7 8.7 7.5  Hemoglobin 11.7 - 15.5 g/dL 14.3 13.7 13.7  Hematocrit 35.0 - 45.0 % 41.3 38.8 41.1  Platelets 140 - 400 Thousand/uL 285 290 284    CMP Latest Ref Rng & Units 02/22/2020 10/26/2019 06/11/2019  Glucose 65 - 139  mg/dL 96 97 97  BUN 7 - 25 mg/dL _0 Creatinine 0.60 - 0.88 mg/dL 0.84 0.96 1.05(H)  Sodium 135 - 146 mmol/L 138 139 135  Potassium 3.5 - 5.3 mmol/L 4.6 4.8 4.8  Chloride 98 - 110 mmol/L 102 103 99  CO2 20 - 32 mmol/L _1 Calcium 8.6 - 10.4 mg/dL 10.1 10.2 9.9  Total Protein 6.1 - 8.1 g/dL - - 7.0  Total Bilirubin 0.2 - 1.2 mg/dL - - 1.4(H)  Alkaline Phos 38 - 126 U/L - - -  AST 10 - 35 U/L - - 18  ALT 6 - 29 U/L - - 13    Hepatic Function Latest Ref Rng & Units 06/11/2019 06/08/2019 07/23/2017  Total Protein 6.1 - 8.1 g/dL 7.0 7.7 6.9  Albumin 3.5 - 5.0  g/dL - 4.3 3.6  AST 10 - 35 U/L _2 ALT 6 - 29 U/L _3 Alk Phosphatase 38 - 126 U/L - 50 56  Total Bilirubin 0.2 - 1.2 mg/dL 1.4(H) 1.8(H) 1.6(H)  Lab data from 02/22/2020 reviewed.  H&H is normal.  Bilirubin mildly elevated but APM transaminases are normal.  She may have Gilbert's syndrome.    Fecal calprotectin was 12 on 10/22/2019  Assessment:  #1.  Ulcerative colitis.  She has 15-year history of pan ulcerative colitis.  She remains in remission while on oral mesalamine. Her last surveillance colonoscopy was in June 2017.  #2.  History of cholelithiasis.  She is not having any symptoms suggestive of gallbladder disease.  Plan:  Continue balsalazide at a dose of 2.250 g by mouth 3 times daily. Patient will call if she has abdominal pain rectal bleeding or diarrhea. Office visit in 1 year.

## 2020-04-12 NOTE — Patient Instructions (Signed)
Please call office if you have rectal bleeding or diarrhea

## 2020-05-07 DIAGNOSIS — I482 Chronic atrial fibrillation, unspecified: Secondary | ICD-10-CM | POA: Diagnosis not present

## 2020-05-07 DIAGNOSIS — I110 Hypertensive heart disease with heart failure: Secondary | ICD-10-CM | POA: Diagnosis not present

## 2020-05-07 DIAGNOSIS — I5022 Chronic systolic (congestive) heart failure: Secondary | ICD-10-CM | POA: Diagnosis not present

## 2020-05-07 DIAGNOSIS — K51918 Ulcerative colitis, unspecified with other complication: Secondary | ICD-10-CM | POA: Diagnosis not present

## 2020-05-11 ENCOUNTER — Other Ambulatory Visit: Payer: Self-pay | Admitting: Cardiology

## 2020-05-25 ENCOUNTER — Telehealth: Payer: Self-pay | Admitting: Cardiology

## 2020-05-25 MED ORDER — FUROSEMIDE 20 MG PO TABS: 40.0000 mg | ORAL_TABLET | Freq: Two times a day (BID) | ORAL | 0 refills | Status: DC

## 2020-05-25 NOTE — Telephone Encounter (Signed)
Medication sent to pharmacy  

## 2020-05-25 NOTE — Telephone Encounter (Signed)
    1. Which medications need to be refilled? (please list name of each medication and dose if known)  LASIX 20 MG   2. Which pharmacy/location (including street and city if local pharmacy) is medication to be sent to?[facility]   3. Do they need a 30 day or 90 day supply?   Patient has scheduled appointment for July

## 2020-05-30 ENCOUNTER — Other Ambulatory Visit: Payer: Self-pay

## 2020-05-30 ENCOUNTER — Encounter: Payer: Self-pay | Admitting: Cardiology

## 2020-05-30 ENCOUNTER — Ambulatory Visit: Payer: Medicare Other | Admitting: Cardiology

## 2020-05-30 VITALS — BP 132/86 | HR 83 | Wt 218.0 lb

## 2020-05-30 DIAGNOSIS — I4819 Other persistent atrial fibrillation: Secondary | ICD-10-CM

## 2020-05-30 DIAGNOSIS — I429 Cardiomyopathy, unspecified: Secondary | ICD-10-CM | POA: Diagnosis not present

## 2020-05-30 DIAGNOSIS — I5042 Chronic combined systolic (congestive) and diastolic (congestive) heart failure: Secondary | ICD-10-CM | POA: Diagnosis not present

## 2020-05-30 LAB — CUP PACEART INCLINIC DEVICE CHECK
Battery Remaining Longevity: 74 mo
Battery Voltage: 2.96 V
Brady Statistic RA Percent Paced: 55 %
Brady Statistic RV Percent Paced: 97 %
Date Time Interrogation Session: 20220523141021
Implantable Lead Implant Date: 20220223
Implantable Lead Implant Date: 20220223
Implantable Lead Implant Date: 20220223
Implantable Lead Location: 753858
Implantable Lead Location: 753859
Implantable Lead Location: 753860
Implantable Pulse Generator Implant Date: 20220223
Lead Channel Impedance Value: 475 Ohm
Lead Channel Impedance Value: 512.5 Ohm
Lead Channel Impedance Value: 637.5 Ohm
Lead Channel Pacing Threshold Amplitude: 0.75 V
Lead Channel Pacing Threshold Amplitude: 0.75 V
Lead Channel Pacing Threshold Amplitude: 1.5 V
Lead Channel Pacing Threshold Pulse Width: 0.5 ms
Lead Channel Pacing Threshold Pulse Width: 0.5 ms
Lead Channel Pacing Threshold Pulse Width: 0.8 ms
Lead Channel Sensing Intrinsic Amplitude: 12 mV
Lead Channel Sensing Intrinsic Amplitude: 4.2 mV
Lead Channel Setting Pacing Amplitude: 2 V
Lead Channel Setting Pacing Amplitude: 2 V
Lead Channel Setting Pacing Amplitude: 2.5 V
Lead Channel Setting Pacing Pulse Width: 0.5 ms
Lead Channel Setting Pacing Pulse Width: 0.8 ms
Lead Channel Setting Sensing Sensitivity: 2 mV
Pulse Gen Model: 3562
Pulse Gen Serial Number: 3861599

## 2020-05-30 NOTE — Progress Notes (Signed)
Electrophysiology Office Note   Date:  05/30/2020   ID:  [name], [name] 06-Aug-1930, MRN [id number]  PCP:  Brown [name] Nation, MD  Cardiologist:  Domenic Polite Primary Electrophysiologist:  [name] Yamamoto Meredith Leeds, MD    No chief complaint on file.    History of Present Illness: [name] is a 87 y.o. female who is being seen today for the evaluation of atrial fibrillation at the request of [name] Nation, MD. Presenting today for electrophysiology evaluation.    She has a history of atrial flutter, atrial fibrillation, coronary artery disease status post RCA stent, nonischemic cardiomyopathy.  She has an ejection fraction of 25%.  She has now status post [place] CRT-P implanted 03/02/2020.  Today, denies symptoms of palpitations, chest pain, shortness of breath, orthopnea, PND, lower extremity edema, claudication, dizziness, presyncope, syncope, bleeding, or neurologic sequela. The patient is tolerating medications without difficulties.  She feels somewhat improved after her CRT-D was implanted.  She continues to be able to do all of her daily activities, though she has noted increased fatigue later in the day.  She feels well in the mornings.  She does have more energy.   Past Medical History:  Diagnosis Date  . Arthritis   . Atrial flutter ([name])   . Bladder cancer ([name])   . Coronary atherosclerosis of native coronary artery    a. BMS RCA May 2014 - [name] stent. b. Cath 04/2017 - patent stent, minimal CAD otherwise.  . Depression   . Essential hypertension   . GI bleeding 06/2017   a. melena/small bowel ulcer by capsule endo 06/2017.  [name] Hyperlipemia   . Macular degeneration   . NICM (nonischemic cardiomyopathy) ([name])   . Persistent atrial fibrillation ([name])   . Ulcerative colitis    Past Surgical History:  Procedure Laterality Date  . ABDOMINAL HYSTERECTOMY    . APPENDECTOMY    . Bilateral knee replacements      2007, 2008  . BIV PACEMAKER INSERTION CRT-P  N/A 03/02/2020   Procedure: BIV PACEMAKER INSERTION CRT-P;  Surgeon: Constance Haw, MD;  Location: [facility];  Service: Cardiovascular;  Laterality: N/A;  . BLADDER SURGERY    . CARDIOVERSION N/A 02/04/2017   Procedure: CARDIOVERSION;  Surgeon: Arnoldo Lenis, MD;  [facility];  Service: Endoscopy;  Laterality: N/A;  . CARDIOVERSION N/A 02/26/2017   Procedure: CARDIOVERSION;  Surgeon: Satira Sark, MD;  [facility]: [facility];  Service: Cardiovascular;  Laterality: N/A;  . CARDIOVERSION N/A 08/07/2017   Procedure: CARDIOVERSION;  Surgeon: Pixie Casino, MD;  Location: [name];  Service: Cardiovascular;  Laterality: N/A;  . COLONOSCOPY N/A 06/15/2015   Procedure: COLONOSCOPY;  Surgeon: [name] Houston, MD;  [facility];  Service: Endoscopy;  Laterality: N/A;  210  . CYSTOSCOPY W/ RETROGRADES Bilateral 05/14/2016   Procedure: CYSTOSCOPY WITH BILATERAL RETROGRADE PYELOGRAM;  Surgeon: Raynelle Bring, MD;  [facility];  Service: Urology;  Laterality: Bilateral;  GENERAL ANESTHESIA WITH PARALYSIS  . ESOPHAGOGASTRODUODENOSCOPY (EGD) WITH PROPOFOL N/A 06/14/2017   Procedure: ESOPHAGOGASTRODUODENOSCOPY (EGD) WITH PROPOFOL;  Surgeon: [name] Houston, MD;  [facility];  Service: Endoscopy;  Laterality: N/A;  . GIVENS CAPSULE STUDY  06/14/2017   Procedure: GIVENS CAPSULE STUDY;  Surgeon: [name] Houston, MD;  [facility];  Service: Endoscopy;;  . LEFT HEART CATHETERIZATION WITH CORONARY ANGIOGRAM N/A 10/30/2013   Procedure: LEFT HEART CATHETERIZATION WITH CORONARY ANGIOGRAM;  Surgeon: [name] Blanks,  MD;  Location: [facility];  Service: Cardiovascular;  Laterality: N/A;  . RIGHT/LEFT HEART CATH AND CORONARY ANGIOGRAPHY N/A 05/03/2017   Procedure: RIGHT/LEFT HEART CATH AND CORONARY ANGIOGRAPHY;  Surgeon: [name] Man, MD;  Location: [facility];  Service: Cardiovascular;  Laterality: N/A;  . TEE WITHOUT CARDIOVERSION  N/A 02/04/2017   Procedure: TRANSESOPHAGEAL ECHOCARDIOGRAM (TEE) WITH PROPOL;  Surgeon: Arnoldo Lenis, MD;  [facility];  Service: Endoscopy;  Laterality: N/A;  . TONSILLECTOMY    . TOTAL KNEE ARTHROPLASTY    . TRANSURETHRAL RESECTION OF BLADDER TUMOR N/A 05/14/2016   Procedure: TRANSURETHRAL RESECTION OF BLADDER TUMOR (TURBT);  Surgeon: Raynelle Bring, MD;  [facility];  Service: Urology;  Laterality: N/A;  GENERAL ANESTHESIA WITH PARALYSIS  . YAG LASER APPLICATION Bilateral 09/15/1189   Procedure: YAG LASER APPLICATION;  Surgeon: [name] Che, MD;  [facility];  Service: Ophthalmology;  Laterality: Bilateral;     Current Outpatient Medications  Medication Sig Dispense Refill  . acetaminophen (TYLENOL) 500 MG tablet Take 1,000 mg by mouth 3 (three) times daily with meals.    [name] atorvastatin (LIPITOR) 20 MG tablet TAKE 1 TABLET BY MOUTH AT BEDTIME 90 tablet 1  . balsalazide (COLAZAL) 750 MG capsule Take 3 capsules (2,250 mg total) by mouth 3 (three) times daily. 270 capsule 11  . Carboxymethylcellulose Sodium (THERATEARS) 0.25 % SOLN Place 1 drop into both eyes in the morning and at bedtime.    . Cholecalciferol (VITAMIN D-3) 25 MCG (1000 UT) CAPS Take 2,000 Units by mouth daily.     [name] CRANBERRY PO Take 4,200 mg by mouth daily. With vit C    . dofetilide (TIKOSYN) 250 MCG capsule TAKE ONE CAPSULE BY MOUTH TWICE DAILY 60 capsule 6  . ENTRESTO 24-26 MG TAKE 1 TABLET BY MOUTH TWICE DAILY - start ENTRESTO 48 HOURS AFTER STOPPING LISINOPRIL 60 tablet 6  . furosemide (LASIX) 20 MG tablet Take 2 tablets (40 mg total) by mouth 2 (two) times daily. 270 tablet 0  . Multiple Vitamins-Minerals (ICAPS AREDS 2 PO) Take 2 tablets by mouth daily.    . nitroGLYCERIN (NITROSTAT) 0.4 MG SL tablet Place 0.4 mg under the tongue every 5 (five) minutes as needed for chest pain.    . potassium chloride SA (KLOR-CON) 20 MEQ tablet Take 1 tablet (20 mEq total) by mouth 3 (three) times daily.  90 tablet 6  . sodium chloride (MURO 128) 2 % ophthalmic solution Place 1 drop into both eyes in the morning and at bedtime.    [name] spironolactone (ALDACTONE) 25 MG tablet TAKE 1/2 TABLET BY MOUTH EVERY DAY 45 tablet 3  . XARELTO 20 MG TABS tablet TAKE 1 TABLET BY MOUTH DAILY WITH SUPPER 90 tablet 1   No current facility-administered medications for this visit.    Allergies:   Sinus & allergy [chlorpheniramine-phenylephrine], Carvedilol, Demerol, and Penicillins   Social History:  The patient  reports that she has never smoked. She has never used smokeless tobacco. She reports that she does not drink alcohol and does not use drugs.   Family History:  The patient's family history includes CAD in her father.   ROS:  Please see the history of present illness.   Otherwise, review of systems is positive for none.   All other systems are reviewed and negative.   PHYSICAL EXAM: VS:  BP 132/86   Pulse 83   Wt 218 lb (98.9 kg)   BMI 38.62 kg/m  ,  BMI Body mass index is 38.62 kg/m. GEN: Well nourished, well developed, in no acute distress  HEENT: normal  Neck: no JVD, carotid bruits, or masses Cardiac: RRR; no murmurs, rubs, or gallops,no edema  Respiratory:  clear to auscultation bilaterally, normal work of breathing GI: soft, nontender, nondistended, + BS MS: no deformity or atrophy  Skin: warm and dry, device site well healed Neuro:  Strength and sensation are intact Psych: euthymic mood, full affect  EKG:  EKG is ordered today. Personal review of the ekg ordered shows atrial sensed, biventricular paced  Personal review of the device interrogation today. Results in [place]: 06/11/2019: ALT 13 02/22/2020: BUN 20; Creat 0.84; Hemoglobin 14.3; Platelets 285; Potassium 4.6; Sodium 138    Lipid Panel  No results found for: CHOL, TRIG, HDL, CHOLHDL, VLDL, LDLCALC, LDLDIRECT   Wt Readings from Last 3 Encounters:  05/30/20 218 lb (98.9 kg)  04/12/20 215 lb (97.5 kg)   03/29/20 216 lb (98 kg)      Other studies Reviewed: Additional studies/ records that were reviewed today include: TTE 11/06/18 Review of the above records today demonstrates:   1. Left ventricular ejection fraction, by visual estimation, is 20 to 25%. The left ventricle has severely decreased function. There is mildly increased left ventricular hypertrophy. There is diffuse hypokinesis.  2. Abnormal septal motion consistent with left bundle branch block.  3. Left ventricular diastolic parameters are consistent with Grade I diastolic dysfunction (impaired relaxation).  4. Global right ventricle has normal systolic function.The right ventricular size is normal. No increase in right ventricular wall thickness.  5. Left atrial size was normal.  6. Right atrial size was normal.  7. Mild to moderate mitral annular calcification.  8. Moderate aortic valve annular calcification.  9. The mitral valve is grossly normal. Mild mitral valve regurgitation. 10. The tricuspid valve is grossly normal. Tricuspid valve regurgitation is trivial. 11. The aortic valve is tricuspid. Aortic valve regurgitation is not visualized. 12. The pulmonic valve was grossly normal. Pulmonic valve regurgitation is mild. 13. The inferior vena cava is normal in size with greater than 50% respiratory variability, suggesting right atrial pressure of 3 mmHg. 14. TR signal is inadequate for assessing pulmonary artery systolic pressure.  LHC 05/03/17  Hemodynamic findings consistent with mild secondary pulmonary hypertension.  Patient has severe nonischemic cardiomyopathy  Moderate to Severely reduced CO/CI  LV end diastolic pressure is moderately elevated.  Mid RCA stent widely patent  There is mild (2+) mitral regurgitation.  [place] 09/16/18 personally reviewed Max 197 bpm 09:12am, 08/24 Min 38 bpm 03:09am, 08/24 Avg 68 bpm Rare PVCs Frequent PACs Atrial fibrillation: None Multiple runs of SVT, longest 11 seconds,  fastest rate 197 for 8 beats  ASSESSMENT AND PLAN:  1.  Persistent atrial fibrillation: Currently on Xarelto and dofetilide.  High risk medication monitoring.  CHA2DS2-VASc of 4.  She has a low burden of atrial fibrillation on device.  No changes.  2.  Nonischemic cardiomyopathy: Ejection fraction remains low at 30 to 35%.  She has a left bundle branch block.  She is status post [place] CRT-P implanted 03/02/2020.  Device functioning appropriately.  No changes.   3.  Coronary artery disease: Status post RCA stent in 2014.  No current chest pain.  Current medicines are reviewed at length with the patient today.   The patient does not have concerns regarding her medicines.  The following changes were made today: none  Labs/ tests ordered today include:  Orders Placed This Encounter  Procedures  . EKG 12-Lead    Disposition:   FU with Cherese Lozano 6 months  Signed, Shamica Moree Meredith Leeds, MD  05/30/2020 2:16 PM     [address] [place] [address] [phone] (office) [phone] (fax)

## 2020-05-30 NOTE — Patient Instructions (Signed)
Medication Instructions:  Your physician recommends that you continue on your current medications as directed. Please refer to the Current Medication list given to you today.  *If you need a refill on your cardiac medications before your next appointment, please call your pharmacy*   Lab Work: None ordered   Testing/Procedures: None ordered   Follow-Up: At [facility], you and your health needs are our priority.  As part of our continuing mission to provide you with exceptional heart care, we have created designated Provider Care Teams.  These Care Teams include your primary Cardiologist (physician) and Advanced Practice Providers (APPs -  Physician Assistants and Nurse Practitioners) who all work together to provide you with the care you need, when you need it.  Remote monitoring is used to monitor your Pacemaker or ICD from home. This monitoring reduces the number of office visits required to check your device to one time per year. It allows [place] to keep an eye on the functioning of your device to ensure it is working properly. You are scheduled for a device check from home on 06/07/2020. You may send your transmission at any time that day. If you have a wireless device, the transmission will be sent automatically. After your physician reviews your transmission, you will receive a postcard with your next transmission date.  Your next appointment:   6 month(s)  The format for your next appointment:   In Person  Provider:   Allegra Lai, MD   Thank you for choosing [facility]!!   Trinidad Curet, RN [phone]

## 2020-06-03 ENCOUNTER — Encounter: Payer: Medicare Other | Admitting: Cardiology

## 2020-06-06 DIAGNOSIS — I428 Other cardiomyopathies: Secondary | ICD-10-CM | POA: Diagnosis not present

## 2020-06-06 DIAGNOSIS — I5022 Chronic systolic (congestive) heart failure: Secondary | ICD-10-CM | POA: Diagnosis not present

## 2020-06-06 DIAGNOSIS — I482 Chronic atrial fibrillation, unspecified: Secondary | ICD-10-CM | POA: Diagnosis not present

## 2020-06-06 DIAGNOSIS — I110 Hypertensive heart disease with heart failure: Secondary | ICD-10-CM | POA: Diagnosis not present

## 2020-06-07 ENCOUNTER — Encounter: Payer: Self-pay | Admitting: *Deleted

## 2020-06-07 ENCOUNTER — Ambulatory Visit (INDEPENDENT_AMBULATORY_CARE_PROVIDER_SITE_OTHER): Payer: Medicare Other

## 2020-06-07 ENCOUNTER — Ambulatory Visit: Payer: Medicare Other | Admitting: Diagnostic Neuroimaging

## 2020-06-07 VITALS — BP 125/70 | HR 64 | Ht 63.0 in | Wt 220.2 lb

## 2020-06-07 DIAGNOSIS — R202 Paresthesia of skin: Secondary | ICD-10-CM | POA: Diagnosis not present

## 2020-06-07 DIAGNOSIS — R29898 Other symptoms and signs involving the musculoskeletal system: Secondary | ICD-10-CM | POA: Diagnosis not present

## 2020-06-07 DIAGNOSIS — R200 Anesthesia of skin: Secondary | ICD-10-CM

## 2020-06-07 DIAGNOSIS — I429 Cardiomyopathy, unspecified: Secondary | ICD-10-CM

## 2020-06-07 NOTE — Patient Instructions (Signed)
RIGHT HAND PAIN / WEAKNESS (possible right carpal tunnel syndrome) - try carpal tunnel wrist splint at bedtime - may consider EMG/NCS in future if sxs are more severe and patient would consider interventions / surgeries

## 2020-06-07 NOTE — Progress Notes (Signed)
[facility]  PATIENT: [name] DOB: 1930/05/04  REFERRING CLINICIAN: Bonsall Nation, MD HISTORY FROM: patient  REASON FOR VISIT: new consult    HISTORICAL  CHIEF COMPLAINT:  Chief Complaint  Patient presents with  . Right hand weakness, tingling    Rm 7 New Pt, dgtr- Diane    HISTORY OF PRESENT ILLNESS:   87 year old female here for evaluation of right hand numbness and weakness.  Symptoms have been present for 3 to 5 years and progressively worsening.  She feels tingling and numbness and burning sensation that is intermittent.  Sometimes this occurs when she lays down on her recliner to go to sleep.  Sometimes this happens when she is washing dishes.  Symptoms only affect right hand distal to the wrist.   REVIEW OF SYSTEMS: Full 14 system review of systems performed and negative with exception of: as per HPI.   ALLERGIES: Allergies  Allergen Reactions  . Sinus & Allergy [Chlorpheniramine-Phenylephrine] Shortness Of Breath  . Carvedilol Itching and Rash    Facial rash/itching  . Demerol Rash  . Penicillins Rash and Other (See Comments)    REACTION: rash, years ago Has patient had a PCN reaction causing immediate rash, facial/tongue/throat swelling, SOB or lightheadedness with hypotension: Yes Has patient had a PCN reaction causing severe rash involving mucus membranes or skin necrosis: No Has patient had a PCN reaction that required hospitalization No Has patient had a PCN reaction occurring within the last 10 years: No If all of the above answers are "NO", then may proceed with Cephalosporin use.     HOME MEDICATIONS: Outpatient Medications Prior to Visit  Medication Sig Dispense Refill  . acetaminophen (TYLENOL) 500 MG tablet Take 1,000 mg by mouth 3 (three) times daily with meals.    Marland Kitchen atorvastatin (LIPITOR) 20 MG tablet TAKE 1 TABLET BY MOUTH AT BEDTIME 90 tablet 1  . balsalazide (COLAZAL) 750 MG capsule Take 3 capsules (2,250 mg  total) by mouth 3 (three) times daily. 270 capsule 11  . Carboxymethylcellulose Sodium (THERATEARS) 0.25 % SOLN Place 1 drop into both eyes in the morning and at bedtime.    . Cholecalciferol (VITAMIN D-3) 25 MCG (1000 UT) CAPS Take 2,000 Units by mouth daily.     Marland Kitchen CRANBERRY PO Take 4,200 mg by mouth daily. With vit C    . dofetilide (TIKOSYN) 250 MCG capsule TAKE ONE CAPSULE BY MOUTH TWICE DAILY 60 capsule 6  . ENTRESTO 24-26 MG TAKE 1 TABLET BY MOUTH TWICE DAILY - start ENTRESTO 48 HOURS AFTER STOPPING LISINOPRIL 60 tablet 6  . furosemide (LASIX) 20 MG tablet Take 2 tablets (40 mg total) by mouth 2 (two) times daily. 270 tablet 0  . Multiple Vitamins-Minerals (ICAPS AREDS 2 PO) Take 2 tablets by mouth daily.    . nitroGLYCERIN (NITROSTAT) 0.4 MG SL tablet Place 0.4 mg under the tongue every 5 (five) minutes as needed for chest pain.    . potassium chloride SA (KLOR-CON) 20 MEQ tablet Take 1 tablet (20 mEq total) by mouth 3 (three) times daily. 90 tablet 6  . sodium chloride (MURO 128) 2 % ophthalmic solution Place 1 drop into both eyes in the morning and at bedtime.    Marland Kitchen spironolactone (ALDACTONE) 25 MG tablet TAKE 1/2 TABLET BY MOUTH EVERY DAY 45 tablet 3  . XARELTO 20 MG TABS tablet TAKE 1 TABLET BY MOUTH DAILY WITH SUPPER 90 tablet 1   No facility-administered medications prior to visit.  PAST MEDICAL HISTORY: Past Medical History:  Diagnosis Date  . Arthritis   . Atrial fibrillation ([place])   . Atrial flutter ([place])   . Bladder cancer ([place])   . Cardiomyopathy ([place])   . Coronary atherosclerosis of native coronary artery    a. BMS RCA May 2014 - [place] stent. b. Cath 04/2017 - patent stent, minimal CAD otherwise.  . Depression   . Essential hypertension   . GI bleeding 06/2017   a. melena/small bowel ulcer by capsule endo 06/2017.  Marland Kitchen Hyperlipemia   . Hypertension   . Macular degeneration   . NICM (nonischemic cardiomyopathy) ([place])   . Persistent atrial fibrillation ([place])   .  Ulcerative colitis     PAST SURGICAL HISTORY: Past Surgical History:  Procedure Laterality Date  . ABDOMINAL HYSTERECTOMY    . APPENDECTOMY    . Bilateral knee replacements      2007, 2008  . BIV PACEMAKER INSERTION CRT-P N/A 03/02/2020   Procedure: BIV PACEMAKER INSERTION CRT-P;  Surgeon: Constance Haw, MD;  Location: [facility];  Service: Cardiovascular;  Laterality: N/A;  . BLADDER SURGERY    . CARDIOVERSION N/A 02/04/2017   Procedure: CARDIOVERSION;  Surgeon: Arnoldo Lenis, MD;  [facility];  Service: Endoscopy;  Laterality: N/A;  . CARDIOVERSION N/A 02/26/2017   Procedure: CARDIOVERSION;  Surgeon: Satira Sark, MD;  [facility];  Service: Cardiovascular;  Laterality: N/A;  . CARDIOVERSION N/A 08/07/2017   Procedure: CARDIOVERSION;  Surgeon: Pixie Casino, MD;  Location: [facility];  Service: Cardiovascular;  Laterality: N/A;  . COLONOSCOPY N/A 06/15/2015   Procedure: COLONOSCOPY;  Surgeon: Rogene Houston, MD;  [facility];  Service: Endoscopy;  Laterality: N/A;  210  . CYSTOSCOPY W/ RETROGRADES Bilateral 05/14/2016   Procedure: CYSTOSCOPY WITH BILATERAL RETROGRADE PYELOGRAM;  Surgeon: Raynelle Bring, MD;  [facility];  Service: Urology;  Laterality: Bilateral;  GENERAL ANESTHESIA WITH PARALYSIS  . ESOPHAGOGASTRODUODENOSCOPY (EGD) WITH PROPOFOL N/A 06/14/2017   Procedure: ESOPHAGOGASTRODUODENOSCOPY (EGD) WITH PROPOFOL;  Surgeon: Rogene Houston, MD;  [facility];  Service: Endoscopy;  Laterality: N/A;  . GIVENS CAPSULE STUDY  06/14/2017   Procedure: GIVENS CAPSULE STUDY;  Surgeon: Rogene Houston, MD;  [facility];  Service: Endoscopy;;  . LEFT HEART CATHETERIZATION WITH CORONARY ANGIOGRAM N/A 10/30/2013   Procedure: LEFT HEART CATHETERIZATION WITH CORONARY ANGIOGRAM;  Surgeon: Burnell Blanks, MD;  Location: [facility];  Service: Cardiovascular;  Laterality: N/A;  . RIGHT/LEFT HEART CATH AND  CORONARY ANGIOGRAPHY N/A 05/03/2017   Procedure: RIGHT/LEFT HEART CATH AND CORONARY ANGIOGRAPHY;  Surgeon: Leonie Man, MD;  Location: [facility];  Service: Cardiovascular;  Laterality: N/A;  . TEE WITHOUT CARDIOVERSION N/A 02/04/2017   Procedure: TRANSESOPHAGEAL ECHOCARDIOGRAM (TEE) WITH PROPOL;  Surgeon: Arnoldo Lenis, MD;  [facility];  Service: Endoscopy;  Laterality: N/A;  . TONSILLECTOMY    . TOTAL KNEE ARTHROPLASTY    . TRANSURETHRAL RESECTION OF BLADDER TUMOR N/A 05/14/2016   Procedure: TRANSURETHRAL RESECTION OF BLADDER TUMOR (TURBT);  Surgeon: Raynelle Bring, MD;  [facility];  Service: Urology;  Laterality: N/A;  GENERAL ANESTHESIA WITH PARALYSIS  . YAG LASER APPLICATION Bilateral 53/02/9922   Procedure: YAG LASER APPLICATION;  Surgeon: Williams Che, MD;  [facility];  Service: Ophthalmology;  Laterality: Bilateral;    FAMILY HISTORY: Family History  Problem Relation Age of Onset  . CAD Father   . Diabetes  Father   . Heart attack Father     SOCIAL HISTORY: Social History   Socioeconomic History  . Marital status: Married    Spouse name: Not on file  . Number of children: 3  . Years of education: Not on file  . Highest education level: Not on file  Occupational History  . Not on file  Tobacco Use  . Smoking status: Never Smoker  . Smokeless tobacco: Never Used  Vaping Use  . Vaping Use: Never used  Substance and Sexual Activity  . Alcohol use: No    Alcohol/week: 0.0 standard drinks  . Drug use: No  . Sexual activity: Not on file  Other Topics Concern  . Not on file  Social History Narrative   Lives with husband   Social Determinants of Health   Financial Resource Strain: Not on file  Food Insecurity: Not on file  Transportation Needs: Not on file  Physical Activity: Not on file  Stress: Not on file  Social Connections: Not on file  Intimate Partner Violence: Not on file     PHYSICAL EXAM  GENERAL  EXAM/CONSTITUTIONAL: Vitals:  Vitals:   06/07/20 1351  BP: 125/70  Pulse: 64  Weight: 220 lb 3.2 oz (99.9 kg)  Height: 5' 3"  (1.6 m)     Body mass index is 39.01 kg/m. Wt Readings from Last 3 Encounters:  06/07/20 220 lb 3.2 oz (99.9 kg)  05/30/20 218 lb (98.9 kg)  04/12/20 215 lb (97.5 kg)     Patient is in no distress; well developed, nourished and groomed; neck is supple  CARDIOVASCULAR:  Examination of carotid arteries is normal; no carotid bruits  Regular rate and rhythm, no murmurs  Examination of peripheral vascular system by observation and palpation is normal  EYES:  Ophthalmoscopic exam of optic discs and posterior segments is normal; no papilledema or hemorrhages  No exam data present  MUSCULOSKELETAL:  Gait, strength, tone, movements noted in Neurologic exam below  NEUROLOGIC: MENTAL STATUS:  No flowsheet data found.  awake, alert, oriented to person, place and time  recent and remote memory intact  normal attention and concentration  language fluent, comprehension intact, naming intact  fund of knowledge appropriate  CRANIAL NERVE:   2nd - no papilledema on fundoscopic exam  2nd, 3rd, 4th, 6th - pupils equal and reactive to light, visual fields full to confrontation, extraocular muscles intact, no nystagmus  5th - facial sensation symmetric  7th - facial strength symmetric  8th - hearing intact  9th - palate elevates symmetrically, uvula midline  11th - shoulder shrug symmetric  12th - tongue protrusion midline  MOTOR:   normal bulk and tone, full strength in the BUE, BLE; EXCEPT ATROPHY AND WEAKNESS OF BILATERAL APB (RIGHT WORSE THAN LEFT)  SENSORY:   normal and symmetric to light touch, temperature, vibration; EXCEPT DECR IN RIGHT HAND  COORDINATION:   finger-nose-finger, fine finger movements normal  REFLEXES:   deep tendon reflexes TRACE and symmetric  GAIT/STATION:   narrow based gait; USING  WALKER     DIAGNOSTIC DATA (LABS, IMAGING, TESTING) - I reviewed patient records, labs, notes, testing and imaging myself where available.  Lab Results  Component Value Date   WBC 7.7 02/22/2020   HGB 14.3 02/22/2020   HCT 41.3 02/22/2020   MCV 91.4 02/22/2020   PLT 285 02/22/2020      Component Value Date/Time   NA 138 02/22/2020 1108   NA 139 10/26/2019 1622   K 4.6 02/22/2020  1108   CL 102 02/22/2020 1108   CO2 26 02/22/2020 1108   GLUCOSE 96 02/22/2020 1108   BUN 20 02/22/2020 1108   BUN 17 10/26/2019 1622   CREATININE 0.84 02/22/2020 1108   CALCIUM 10.1 02/22/2020 1108   PROT 7.0 06/11/2019 1157   ALBUMIN 4.3 06/08/2019 1205   AST 18 06/11/2019 1157   ALT 13 06/11/2019 1157   ALKPHOS 50 06/08/2019 1205   BILITOT 1.4 (H) 06/11/2019 1157   GFRNONAA 53 (L) 10/26/2019 1622   GFRNONAA 48 (L) 06/11/2019 1157   GFRAA 61 10/26/2019 1622   GFRAA 55 (L) 06/11/2019 1157   No results found for: CHOL, HDL, LDLCALC, LDLDIRECT, TRIG, CHOLHDL No results found for: HGBA1C No results found for: VITAMINB12 Lab Results  Component Value Date   TSH 3.932 07/23/2017      ASSESSMENT AND PLAN  87 y.o. year old female here with:  Dx:  1. Right hand weakness   2. Numbness and tingling in right hand      PLAN:  RIGHT HAND PAIN / WEAKNESS (possible right carpal tunnel syndrome) - try carpal tunnel wrist splint at bedtime - may consider EMG/NCS in future if sxs are more severe and patient would consider interventions / surgeries  Return for return to PCP, pending if symptoms worsen or fail to improve.    Penni Bombard, MD 0/53/9767, 3:41 PM Certified in Neurology, Neurophysiology and Neuroimaging  [facility] [address] [place], [place] [address] [phone]

## 2020-06-08 ENCOUNTER — Other Ambulatory Visit: Payer: Self-pay | Admitting: Cardiology

## 2020-06-08 LAB — CUP PACEART REMOTE DEVICE CHECK
Battery Remaining Longevity: 79 mo
Battery Remaining Percentage: 95.5 %
Battery Voltage: 2.99 V
Brady Statistic AP VP Percent: 57 %
Brady Statistic AP VS Percent: 1 %
Brady Statistic AS VP Percent: 40 %
Brady Statistic AS VS Percent: 1 %
Brady Statistic RA Percent Paced: 55 %
Date Time Interrogation Session: 20220531023833
Implantable Lead Implant Date: 20220223
Implantable Lead Implant Date: 20220223
Implantable Lead Implant Date: 20220223
Implantable Lead Location: 753858
Implantable Lead Location: 753859
Implantable Lead Location: 753860
Implantable Pulse Generator Implant Date: 20220223
Lead Channel Impedance Value: 440 Ohm
Lead Channel Impedance Value: 510 Ohm
Lead Channel Impedance Value: 540 Ohm
Lead Channel Pacing Threshold Amplitude: 0.75 V
Lead Channel Pacing Threshold Amplitude: 0.75 V
Lead Channel Pacing Threshold Amplitude: 1.5 V
Lead Channel Pacing Threshold Pulse Width: 0.5 ms
Lead Channel Pacing Threshold Pulse Width: 0.5 ms
Lead Channel Pacing Threshold Pulse Width: 0.8 ms
Lead Channel Sensing Intrinsic Amplitude: 12 mV
Lead Channel Sensing Intrinsic Amplitude: 5 mV
Lead Channel Setting Pacing Amplitude: 2 V
Lead Channel Setting Pacing Amplitude: 2 V
Lead Channel Setting Pacing Amplitude: 2.5 V
Lead Channel Setting Pacing Pulse Width: 0.5 ms
Lead Channel Setting Pacing Pulse Width: 0.8 ms
Lead Channel Setting Sensing Sensitivity: 2 mV
Pulse Gen Model: 3562
Pulse Gen Serial Number: 3861599

## 2020-06-14 DIAGNOSIS — D6869 Other thrombophilia: Secondary | ICD-10-CM | POA: Diagnosis not present

## 2020-06-14 DIAGNOSIS — I509 Heart failure, unspecified: Secondary | ICD-10-CM | POA: Diagnosis not present

## 2020-06-14 DIAGNOSIS — I4891 Unspecified atrial fibrillation: Secondary | ICD-10-CM | POA: Diagnosis not present

## 2020-06-14 DIAGNOSIS — I428 Other cardiomyopathies: Secondary | ICD-10-CM | POA: Diagnosis not present

## 2020-06-28 NOTE — Progress Notes (Signed)
Remote pacemaker transmission.   

## 2020-07-10 ENCOUNTER — Other Ambulatory Visit: Payer: Self-pay | Admitting: Cardiology

## 2020-07-13 ENCOUNTER — Telehealth: Payer: Self-pay | Admitting: Cardiology

## 2020-07-13 NOTE — Telephone Encounter (Signed)
Patient said she got a MyChart message to check meds for refills and to review in her MyChart. She would like to speak with you [phone].

## 2020-07-13 NOTE — Telephone Encounter (Signed)
Spoke to pt who states that she received a MyChart message to review her medications with [place] after she received her refill yesterday. The message was a generic message sent to MyChart users to remind them to review their medications. Pt was just following what the message said. Pt verbalized understanding that it was a generic message and she does not have to call each time she receives a message like this.

## 2020-07-21 ENCOUNTER — Telehealth: Payer: Self-pay

## 2020-07-21 NOTE — Telephone Encounter (Signed)
Known AF, on [place], good ventricular rate control.  Sent to triage due to persistent AF.   Reports yesterday she experienced increased fatigue and was not able to do much. Denies palpitations, shortness of breath or other symptoms other than fatigue. Reports today she feels a little better but still fatigue. [place] appears stable, denies fluid retention symptoms. Reports compliance with all medications on file including Entresto 24-26 mg BID, Lasix 20 mg BID, Tikosyn 250 mcg BID, xarelto 20 mg daily.   Advised patient I will forward her information to the AF clinic d/t symptoms and increase in AF burden. Patient expresses concern about having a way to get to appointment. Explained to patient that [facility] now offers assistance to get patients to appointments if you meet criteria. Patient does any require any assitance getting in and out of care. Advised patient to mention to staff if she will have to come into office for appt. Patient verbalized understanding and appreciative of call.

## 2020-07-21 NOTE — Telephone Encounter (Signed)
Talked with patient. She is feeling much better today. Her HR is 73 BP 120/64. Discussed with Adline Peals PA pt has follow up with Dr. Domenic Polite on 7/25 unless pt becomes symptomatic, increase in HR or issues with fluid retention he is ok with pt waiting to follow up on 7/25.  Pt in agreement and will let me know if issues arise prior the appt.

## 2020-07-31 NOTE — Progress Notes (Signed)
Cardiology Office Note  Date: 08/01/2020   ID: [name], [name] [birth year], MRN [id number]  PCP:   [place], MD  Cardiologist:  Rozann Lesches, MD Electrophysiologist:  Constance Haw, MD   Chief Complaint  Patient presents with   Cardiac follow-up    History of Present Illness: [name] is an 87 y.o. female last seen by Dr. Curt Bears for EP follow-up back in May.  She is here for a routine visit.  Overall no major change in stamina.  She has been on a diet and lost about 10 pounds from her previous weight, no orthopnea or PND.  She reports NYHA class II-III dyspnea depending on level of activity.  Using a cane, no recent falls reported.  [employer] CRT-P in place followed by Dr. Curt Bears.  Device interrogation in May demonstrated normal device function with increased AT/AF burden to 3.3% since May and a recent persistent episode.  Biventricular pacing has been in the high 90s.  I did talk with her about considering addition of a low-dose beta-blocker, she has a prior intolerance to carvedilol (hair loss).  The remainder of her cardiac regimen is stable and outlined below.  She does not report any spontaneous bleeding problems on Xarelto.  She anticipates follow-up with PCP this year for repeat lab work.  I personally reviewed her ECG today which shows dual-chamber pacing with intermittent ventricular pacing.  Past Medical History:  Diagnosis Date   Arthritis    Atrial fibrillation [facility])    Atrial flutter ([place])    Bladder cancer ([place])    Cardiomyopathy ([place])    Coronary atherosclerosis of native coronary artery    a. BMS RCA May 2014 - [place] stent. b. Cath 04/2017 - patent stent, minimal CAD otherwise.   Depression    Essential hypertension    GI bleeding 06/2017   a. melena/small bowel ulcer by capsule endo 06/2017.   Hyperlipemia    Hypertension    Macular degeneration    NICM (nonischemic cardiomyopathy) ([place])    Persistent atrial fibrillation  (HCC)    Ulcerative colitis     Past Surgical History:  Procedure Laterality Date   ABDOMINAL HYSTERECTOMY     APPENDECTOMY     Bilateral knee replacements      2007, 2008   BIV PACEMAKER INSERTION CRT-P N/A 03/02/2020   Procedure: BIV PACEMAKER INSERTION CRT-P;  Surgeon: Constance Haw, MD;  Location: [facility];  Service: Cardiovascular;  Laterality: N/A;   BLADDER SURGERY     CARDIOVERSION N/A 02/04/2017   Procedure: CARDIOVERSION;  Surgeon: Arnoldo Lenis, MD;  [facility];  Service: Endoscopy;  Laterality: N/A;   CARDIOVERSION N/A 02/26/2017   Procedure: CARDIOVERSION;  Surgeon: Satira Sark, MD;  Location: [facility];  Service: Cardiovascular;  Laterality: N/A;   CARDIOVERSION N/A 08/07/2017   Procedure: CARDIOVERSION;  Surgeon: Pixie Casino, MD;  Location: [facility];  Service: Cardiovascular;  Laterality: N/A;   COLONOSCOPY N/A 06/15/2015   Procedure: COLONOSCOPY;  Surgeon: Rogene Houston, MD;  [facility];  Service: Endoscopy;  Laterality: N/A;  210   CYSTOSCOPY W/ RETROGRADES Bilateral 05/14/2016   Procedure: CYSTOSCOPY WITH BILATERAL RETROGRADE PYELOGRAM;  Surgeon: Raynelle Bring, MD;  Location[facility];  Service: Urology;  Laterality: Bilateral;  GENERAL ANESTHESIA WITH PARALYSIS   ESOPHAGOGASTRODUODENOSCOPY (EGD) WITH PROPOFOL N/A 06/14/2017   Procedure: ESOPHAGOGASTRODUODENOSCOPY (EGD) WITH PROPOFOL;  Surgeon: Rogene Houston, MD;  [facility];  Service: Endoscopy;  Laterality: N/A;   GIVENS CAPSULE STUDY  06/14/2017   Procedure: GIVENS CAPSULE STUDY;  Surgeon: Rogene Houston, MD;  [facility];  Service: Endoscopy;;   LEFT HEART CATHETERIZATION WITH CORONARY ANGIOGRAM N/A 10/30/2013   Procedure: LEFT HEART CATHETERIZATION WITH CORONARY ANGIOGRAM;  Surgeon: Burnell Blanks, MD;  Location: [facility] C[facility] LAB;  Service: Cardiovascular;  Laterality: N/A;   RIGHT/LEFT HEART CATH AND CORONARY ANGIOGRAPHY N/A  05/03/2017   Procedure: RIGHT/LEFT HEART CATH AND CORONARY ANGIOGRAPHY;  Surgeon: Leonie Man, MD;  Location: [facility];  Service: Cardiovascular;  Laterality: N/A;   TEE WITHOUT CARDIOVERSION N/A 02/04/2017   Procedure: TRANSESOPHAGEAL ECHOCARDIOGRAM (TEE) WITH PROPOL;  Surgeon: Arnoldo Lenis, MD;  [facility];  Service: Endoscopy;  Laterality: N/A;   TONSILLECTOMY     TOTAL KNEE ARTHROPLASTY     TRANSURETHRAL RESECTION OF BLADDER TUMOR N/A 05/14/2016   Procedure: TRANSURETHRAL RESECTION OF BLADDER TUMOR (TURBT);  Surgeon: Raynelle Bring, MD;  [facility];  Service: Urology;  Laterality: N/A;  GENERAL ANESTHESIA WITH PARALYSIS   YAG LASER APPLICATION Bilateral 00/03/7046   Procedure: YAG LASER APPLICATION;  Surgeon: Williams Che, MD;  Location[facility];  Service: Ophthalmology;  Laterality: Bilateral;    Current Outpatient Medications  Medication Sig Dispense Refill   acetaminophen (TYLENOL) 500 MG tablet Take 1,000 mg by mouth 3 (three) times daily with meals.     atorvastatin (LIPITOR) 20 MG tablet TAKE 1 TABLET BY MOUTH AT BEDTIME 90 tablet 1   balsalazide (COLAZAL) 750 MG capsule Take 3 capsules (2,250 mg total) by mouth 3 (three) times daily. 270 capsule 11   bisoprolol (ZEBETA) 5 MG tablet Take 0.5 tablets (2.5 mg total) by mouth daily. 45 tablet 1   Carboxymethylcellulose Sodium (THERATEARS) 0.25 % SOLN Place 1 drop into both eyes in the morning and at bedtime.     Cholecalciferol (VITAMIN D-3) 25 MCG (1000 UT) CAPS Take 2,000 Units by mouth daily.      CRANBERRY PO Take 4,200 mg by mouth daily. With vit C     dofetilide (TIKOSYN) 250 MCG capsule TAKE ONE CAPSULE BY MOUTH TWICE DAILY 60 capsule 6   ENTRESTO 24-26 MG TAKE 1 TABLET BY MOUTH TWICE DAILY - start ENTRESTO 48 HOURS AFTER STOPPING LISINOPRIL 60 tablet 6   furosemide (LASIX) 20 MG tablet TAKE TWO TABLETS BY MOUTH TWICE DAILY 270 tablet 1   Multiple Vitamins-Minerals (ICAPS AREDS 2 PO) Take 2  tablets by mouth daily.     nitroGLYCERIN (NITROSTAT) 0.4 MG SL tablet Place 0.4 mg under the tongue every 5 (five) minutes as needed for chest pain.     potassium chloride SA (KLOR-CON) 20 MEQ tablet TAKE 1 TABLET BY MOUTH THREE TIMES DAILY 90 tablet 6   sodium chloride (MURO 128) 2 % ophthalmic solution Place 1 drop into both eyes in the morning and at bedtime.     spironolactone (ALDACTONE) 25 MG tablet TAKE 1/2 TABLET BY MOUTH EVERY DAY 45 tablet 3   XARELTO 20 MG TABS tablet TAKE 1 TABLET BY MOUTH DAILY WITH SUPPER 90 tablet 1   No current facility-administered medications for this visit.   Allergies:  Sinus & allergy [chlorpheniramine-phenylephrine], Carvedilol, Demerol, and Penicillins   ROS: No palpitations or syncope.  Physical Exam: VS:  BP 120/68   Pulse 69   Ht 5' 3"  (1.6 m)   Wt 217 lb (98.4 kg)   SpO2 95%   BMI  38.44 kg/m , BMI Body mass index is 38.44 kg/m.  Wt Readings from Last 3 Encounters:  08/01/20 217 lb (98.4 kg)  06/07/20 220 lb 3.2 oz (99.9 kg)  05/30/20 218 lb (98.9 kg)    General: Elderly woman, appears comfortable at rest. HEENT: Conjunctiva and lids normal, wearing a mask. Neck: Supple, no elevated JVP or carotid bruits, no thyromegaly. Lungs: Clear to auscultation, nonlabored breathing at rest. Cardiac: Regular rate and rhythm, no S3 or significant systolic murmur. Extremities: Mild ankle edema.  ECG:  An ECG dated 05/30/2020 was personally reviewed today and demonstrated:  Ventricular pacing.  Recent Labwork: 02/22/2020: BUN 20; Creat 0.84; Hemoglobin 14.3; Platelets 285; Potassium 4.6; Sodium 138   Other Studies Reviewed Today:  Echocardiogram 03/25/2019:  1. Endocardium poorly visualized, difficult to assess LV function.  Recommend limited contrast study to better evaluate LVEF, 30-35% is rough  estimate. . Left ventricular ejection fraction, by estimation, is 30-35%.  The left ventricle has moderate to  severely decreased function. The left  ventricle demonstrates global  hypokinesis. Left ventricular diastolic parameters are consistent with  Grade I diastolic dysfunction (impaired relaxation).   2. Right ventricular systolic function is normal. The right ventricular  size is normal. There is normal pulmonary artery systolic pressure.   3. Left atrial size was severely dilated.   4. Right atrial size was mildly dilated.   5. The mitral valve is normal in structure. No evidence of mitral valve  regurgitation. No evidence of mitral stenosis.   6. The aortic valve is tricuspid. Aortic valve regurgitation is not  visualized. No aortic stenosis is present.   7. The inferior vena cava is normal in size with greater than 50%  respiratory variability, suggesting right atrial pressure of 3 mmHg.  Assessment and Plan:  1.  Nonischemic cardiomyopathy with chronic combined heart failure, LVEF 30 to 35%.  She is status post CRT-P and remains on medical therapy.  Weight is down from prior assessment, generally NYHA class II-III symptoms depending on level of activity.  Continue Entresto, Aldactone, Lasix, and potassium supplement.  We will try adding bisoprolol 2.5 mg daily as well particular with increased AF burden.  2.  Persistent atrial fibrillation with CHA2DS2-VASc score of 6.  She remains on Xarelto for stroke prophylaxis, no spontaneous bleeding problems.  Also on Tikosyn for rhythm control.  3.  CAD status post BMS to the RCA in 2014.  No active angina at this time.  Continue statin therapy.  Medication Adjustments/Labs and Tests Ordered: Current medicines are reviewed at length with the patient today.  Concerns regarding medicines are outlined above.   Tests Ordered: Orders Placed This Encounter  Procedures   EKG 12-Lead     Medication Changes: Meds ordered this encounter  Medications   bisoprolol (ZEBETA) 5 MG tablet    Sig: Take 0.5 tablets (2.5 mg total) by mouth daily.    Dispense:  45 tablet    Refill:  1     08/01/2020 NEW     Disposition:  Follow up  6 months.  Signed, Satira Sark, MD, [facility] 08/01/2020 11:37 AM    [facility] [place], [place], [place] [address] Phone: [phone]; Fax: [phone]

## 2020-08-01 ENCOUNTER — Encounter: Payer: Self-pay | Admitting: Cardiology

## 2020-08-01 ENCOUNTER — Ambulatory Visit: Payer: Medicare Other | Admitting: Cardiology

## 2020-08-01 ENCOUNTER — Other Ambulatory Visit: Payer: Self-pay

## 2020-08-01 VITALS — BP 120/68 | HR 69 | Ht 63.0 in | Wt 217.0 lb

## 2020-08-01 DIAGNOSIS — I25119 Atherosclerotic heart disease of native coronary artery with unspecified angina pectoris: Secondary | ICD-10-CM

## 2020-08-01 DIAGNOSIS — I428 Other cardiomyopathies: Secondary | ICD-10-CM

## 2020-08-01 DIAGNOSIS — I4819 Other persistent atrial fibrillation: Secondary | ICD-10-CM

## 2020-08-01 MED ORDER — BISOPROLOL FUMARATE 5 MG PO TABS: 2.5000 mg | ORAL_TABLET | Freq: Every day | ORAL | 1 refills | Status: DC

## 2020-08-01 NOTE — Patient Instructions (Addendum)
Medication Instructions:  Your physician has recommended you make the following change in your medication:  Start bisoprolol 2.5 mg by mouth daily Continue other medications the same  Labwork: none  Testing/Procedures: none  Follow-Up: Your physician recommends that you schedule a follow-up appointment in: 6 months  Any Other Special Instructions Will Be Listed Below (If Applicable).  If you need a refill on your cardiac medications before your next appointment, please call your pharmacy.

## 2020-08-07 DIAGNOSIS — I482 Chronic atrial fibrillation, unspecified: Secondary | ICD-10-CM | POA: Diagnosis not present

## 2020-08-07 DIAGNOSIS — I5022 Chronic systolic (congestive) heart failure: Secondary | ICD-10-CM | POA: Diagnosis not present

## 2020-08-07 DIAGNOSIS — I110 Hypertensive heart disease with heart failure: Secondary | ICD-10-CM | POA: Diagnosis not present

## 2020-08-07 DIAGNOSIS — I428 Other cardiomyopathies: Secondary | ICD-10-CM | POA: Diagnosis not present

## 2020-09-06 ENCOUNTER — Ambulatory Visit (INDEPENDENT_AMBULATORY_CARE_PROVIDER_SITE_OTHER): Payer: Medicare Other

## 2020-09-06 DIAGNOSIS — I428 Other cardiomyopathies: Secondary | ICD-10-CM

## 2020-09-06 LAB — CUP PACEART REMOTE DEVICE CHECK
Battery Remaining Longevity: 74 mo
Battery Remaining Percentage: 90 %
Battery Voltage: 2.99 V
Brady Statistic AP VP Percent: 69 %
Brady Statistic AP VS Percent: 1 %
Brady Statistic AS VP Percent: 30 %
Brady Statistic AS VS Percent: 1 %
Brady Statistic RA Percent Paced: 65 %
Date Time Interrogation Session: 20220829051835
Implantable Lead Implant Date: 20220223
Implantable Lead Implant Date: 20220223
Implantable Lead Implant Date: 20220223
Implantable Lead Location: 753858
Implantable Lead Location: 753859
Implantable Lead Location: 753860
Implantable Pulse Generator Implant Date: 20220223
Lead Channel Impedance Value: 450 Ohm
Lead Channel Impedance Value: 550 Ohm
Lead Channel Impedance Value: 610 Ohm
Lead Channel Pacing Threshold Amplitude: 0.75 V
Lead Channel Pacing Threshold Amplitude: 0.75 V
Lead Channel Pacing Threshold Amplitude: 1.5 V
Lead Channel Pacing Threshold Pulse Width: 0.5 ms
Lead Channel Pacing Threshold Pulse Width: 0.5 ms
Lead Channel Pacing Threshold Pulse Width: 0.8 ms
Lead Channel Sensing Intrinsic Amplitude: 4.9 mV
Lead Channel Sensing Intrinsic Amplitude: 9.5 mV
Lead Channel Setting Pacing Amplitude: 2 V
Lead Channel Setting Pacing Amplitude: 2 V
Lead Channel Setting Pacing Amplitude: 2.5 V
Lead Channel Setting Pacing Pulse Width: 0.5 ms
Lead Channel Setting Pacing Pulse Width: 0.8 ms
Lead Channel Setting Sensing Sensitivity: 2 mV
Pulse Gen Model: 3562
Pulse Gen Serial Number: 3861599

## 2020-09-07 DIAGNOSIS — I110 Hypertensive heart disease with heart failure: Secondary | ICD-10-CM | POA: Diagnosis not present

## 2020-09-07 DIAGNOSIS — I5022 Chronic systolic (congestive) heart failure: Secondary | ICD-10-CM | POA: Diagnosis not present

## 2020-09-07 DIAGNOSIS — I428 Other cardiomyopathies: Secondary | ICD-10-CM | POA: Diagnosis not present

## 2020-09-07 DIAGNOSIS — I482 Chronic atrial fibrillation, unspecified: Secondary | ICD-10-CM | POA: Diagnosis not present

## 2020-09-19 NOTE — Progress Notes (Signed)
Remote pacemaker transmission.   

## 2020-10-05 ENCOUNTER — Ambulatory Visit: Payer: Medicare Other | Admitting: Urology

## 2020-10-05 ENCOUNTER — Other Ambulatory Visit: Payer: Self-pay

## 2020-10-05 ENCOUNTER — Encounter: Payer: Self-pay | Admitting: Urology

## 2020-10-05 VITALS — BP 93/55 | HR 61

## 2020-10-05 DIAGNOSIS — C678 Malignant neoplasm of overlapping sites of bladder: Secondary | ICD-10-CM | POA: Diagnosis not present

## 2020-10-05 LAB — URINALYSIS, ROUTINE W REFLEX MICROSCOPIC
Bilirubin, UA: NEGATIVE
Glucose, UA: NEGATIVE
Ketones, UA: NEGATIVE
Nitrite, UA: NEGATIVE
Protein,UA: NEGATIVE
RBC, UA: NEGATIVE
Specific Gravity, UA: 1.01 (ref 1.005–1.030)
Urobilinogen, Ur: 0.2 mg/dL (ref 0.2–1.0)
pH, UA: 5.5 (ref 5.0–7.5)

## 2020-10-05 LAB — MICROSCOPIC EXAMINATION
Epithelial Cells (non renal): >10 /hpf — AB (ref 0–10)
RBC, Urine: NONE SEEN /hpf (ref 0–2)
Renal Epithel, UA: NONE SEEN /hpf

## 2020-10-05 MED ORDER — CEPHALEXIN 250 MG PO CAPS: 250.0000 mg | ORAL_CAPSULE | Freq: Two times a day (BID) | ORAL | 0 refills | Status: DC

## 2020-10-05 MED ORDER — CIPROFLOXACIN HCL 500 MG PO TABS: 500.0000 mg | ORAL_TABLET | Freq: Once | ORAL | Status: DC

## 2020-10-05 NOTE — Progress Notes (Signed)
   10/05/20  CC: Followup bladder cancer   HPI: [name] is a 87yo here for followup fro TaG3 bladder cancer,.. No new urinary complaints Blood pressure (!) 93/55, pulse 61. NED. A&Ox3.   No respiratory distress   Abd soft, NT, ND Normal external genitalia with patent urethral meatus  Cystoscopy Procedure Note  Patient identification was confirmed, informed consent was obtained, and patient was prepped using Betadine solution.  Lidocaine jelly was administered per urethral meatus.    Procedure: - Flexible cystoscope introduced, without any difficulty.   - Thorough search of the bladder revealed:    normal urethral meatus    normal urothelium    no stones    no ulcers     1cm papillary tumor left lateral wall at previous resection site.     no urethral polyps    no trabeculation  - Ureteral orifices were normal in position and appearance.  Post-Procedure: - Patient tolerated the procedure well  Assessment/ Plan: Schedule for bladder tumor resection   No follow-ups on file.  Nicolette Bang, MD

## 2020-10-05 NOTE — Patient Instructions (Signed)
Bladder Cancer Bladder cancer is a condition in which abnormal tissue (a tumor) grows in the bladder. The bladder is the organ that holds urine. Two tubes (ureters) carry the urine from the kidneys to the bladder. The bladder wall is made of layers of tissue. Cancer that spreads through these layers of the bladder wall becomes more difficult to treat. What are the causes? The cause of this condition is not known. What increases the risk? The following factors may make you more likely to develop this condition: Smoking. Working where there are risks (occupational exposures), such as working with Engineer, structural, Brewing technologist, clothing fabric, dyes, chemicals, and paint. Being 85 years of age or older. Being female. Having bladder inflammation that is long-term (chronic). Having a history of cancer, including: A family history of bladder cancer. Personal experience with bladder cancer. Having had certain treatments for cancer before. These include: Medicines to kill cancer cells (chemotherapy). Strong X-ray beams or capsules high in energy to kill cancer cells and shrink tumors (radiation therapy). Having been exposed to arsenic. This is a Financial risk analyst that can poison you. What are the signs or symptoms? Early symptoms of this condition include: Seeing blood in your urine. Feeling pain when urinating. Having infections of your urinary system (urinary tract infections or UTIs) that happen often. Having to urinate sooner or more often than usual. Later symptoms of this condition include: Not being able to urinate. Pain on one side of your lower back. Loss of appetite. Weight loss. Tiredness (fatigue). Swelling in your feet. Bone pain. How is this diagnosed? This condition is diagnosed based on: Your medical history. A physical exam. Lab tests, such as urine tests. Imaging tests. Your symptoms. You may also have other tests or procedures done, such as: A cystoscopy. A narrow tube is inserted  into your bladder through the organ that connects your bladder to the outside of your body (urethra). This is done to view the lining of your bladder for tumors. A biopsy. This procedure involves removing a tissue sample to look at it under a microscope to see if cancer is present. It is important to find out: How deeply into the bladder wall cancer has grown. Whether cancer has spread to any other parts of your body. This may require blood tests or imaging tests, such as a CT scan, MRI, bone scan, or X-rays. How is this treated? Your health care provider may recommend one or more types of treatment based on the stage of your cancer. The most common types of treatment are: Surgery to remove the cancer. Procedures that may be done include: Removing a tumor on the inside wall of the bladder (transurethral resection). Removing the bladder (cystectomy). Radiation therapy. This is often used together with chemotherapy. Chemotherapy. Immunotherapy. This uses medicines to help your immune system destroy cancer cells. Follow these instructions at home: Take over-the-counter and prescription medicines only as told by your health care provider. Eat a healthy diet. Some of your treatments might affect your appetite. Do not use any products that contain nicotine or tobacco, such as cigarettes, e-cigarettes, and chewing tobacco. If you need help quitting, ask your health care provider. Consider joining a support group. This may help you learn to cope with the stress of having bladder cancer. Tell your cancer care team if you develop side effects. Your team may be able to recommend ways to get relief. Keep all follow-up visits as told by your health care provider. This is important. Where to find more information American  Cancer Society: www.cancer.[place] ([place]): www.cancer.gov Contact a health care provider if: You have symptoms of a urinary tract infection. These  include: Fever. Chills. Weakness. Muscle aches. Pain in your abdomen. Urge to urinate that is stronger and happens more often than usual. Burning feeling in the bladder or urethra when you urinate. Get help right away if: There is blood in your urine. You cannot urinate. You have severe pain or other symptoms that do not go away. Summary Bladder cancer is a condition in which tumors grow in the bladder and cause illness. This condition is diagnosed based on your medical history, a physical exam, lab tests, imaging tests, and your symptoms. Your health care provider may recommend one or more types of treatment based on the stage of your cancer. Consider joining a support group. This may help you learn to cope with the stress of having bladder cancer. This information is not intended to replace advice given to you by your health care provider. Make sure you discuss any questions you have with your health care provider. Document Revised: 09/03/2018 Document Reviewed: 09/03/2018 Elsevier Patient Education  [place].

## 2020-10-05 NOTE — Progress Notes (Signed)

## 2020-10-06 ENCOUNTER — Telehealth: Payer: Self-pay | Admitting: Cardiology

## 2020-10-06 NOTE — Telephone Encounter (Signed)
    Patient Name: [name]  DOB: 1930/08/02 MRN: [id number]  Primary Cardiologist: Rozann Lesches, MD  Chart reviewed as part of pre-operative protocol coverage.   Spoke with the patient regarding surgical clearance.  Patient reports low blood pressure, systolic being in 02I, since started on bisoprolol.  She also feels somewhat fatigue.  She is able to do household chores without any difficulty.  Also reports intermittent chest discomfort which sounds positional.  Her chest pain resolved with changing position.  Will route to Dr. Domenic Polite regarding clearance and to see if patient needs office visit or not.  Pharmacy to review anticoagulation.  [place], [place] 10/06/2020, 2:37 PM

## 2020-10-06 NOTE — Progress Notes (Signed)
Cardiology clearance request sent.

## 2020-10-06 NOTE — Telephone Encounter (Signed)
Noted  

## 2020-10-06 NOTE — Telephone Encounter (Signed)
Letter by Dorisann Frames, RN on 10/06/2020      [address] [place], [place]  [address]    [phone]                                                                                                                10/06/2020 Patient: [name] DOB January 24, 1930          MRN [id number]     Dr. Domenic Polite,   The above stated patient is having cystoscopy with transuretheral resection bladder tumor procedure performed at [facility] on October 13. Dr. Nicolette Bang will perform this procedure using general anesthesia . Dr.McKenzie  is asking for cardiology clearance and for approval for patient to hold Xarelto 3 days before the scheduled procedure.  Please send back supporting documentation of this approval request.   Thank you, Estill Bamberg RN Dr. Alyson Ingles

## 2020-10-06 NOTE — Telephone Encounter (Signed)
Patient with diagnosis of atrial fibrillation on Xarelto for anticoagulation.    Procedure: cystoscopy with transurethral resection of bladder tumor Date of procedure: 10/20/20   CHA2DS2-VASc Score = 6   This indicates a 9.7% annual risk of stroke. The patient's score is based upon: CHF History: 1 HTN History: 1 Diabetes History: 0 Stroke History: 0 Vascular Disease History: 1 Age Score: 2 Gender Score: 1   CrCl 52 (with adjusted body weight) Platelet count 285  Per office protocol, patient can hold Xarelto for 3 days prior to procedure.   Patient will not need bridging with Lovenox (enoxaparin) around procedure.  Patient should restart Xarelto on the evening of procedure or day after, at discretion of procedure MD

## 2020-10-06 NOTE — Telephone Encounter (Signed)
Spoke with the patient again.  She is able to function and able to do household chores without any problem.  I have advised her to keep log of blood pressure and if systolic blood pressure goes below 90 and feeling poorly hold bisoprolol for that day and see response.  If this becomes trends, she will call us for further recommendation.  For now, patient will continue current medications.  Patient is already cleared by Dr. Domenic Polite as below.  Awaiting pharmacy to review anticoagulation.

## 2020-10-07 NOTE — Telephone Encounter (Signed)
   Name: [name]  DOB: [birth year]  MRN: [id number]   Primary Cardiologist: Rozann Lesches, MD  Chart reviewed as part of pre-operative protocol coverage. Patient was contacted 10/07/2020 in reference to pre-operative risk assessment for pending surgery as outlined below.  [name] was last seen on 08/01/20 by Dr. Domenic Polite.  Since that day, [name] has done well.  Per Dr. Domenic Polite: RCRI perioperative cardiac risk calculator indicates intermediate periprocedural risk, class III with 6.6% chance of major adverse cardiac event.  If she has been clinically stable, I would not anticipate further testing prior to her undergoing cystoscopy under general anesthesia.  Anticipate Xarelto could be held as requested, will defer to our formal pharmacy protocol for recommendations.  If she has been relatively hypotensive and feeling worse on bisoprolol, this could be discontinued.  I already have her on the lowest doses of her other cardiac medications.   Bisoprolol was discussed with the patient, plan noted in Epic.  Per our clinical pharmacist: Patient with diagnosis of atrial fibrillation on Xarelto for anticoagulation.     Procedure: cystoscopy with transurethral resection of bladder tumor Date of procedure: 10/20/20     CHA2DS2-VASc Score = 6   This indicates a 9.7% annual risk of stroke. The patient's score is based upon: CHF History: 1 HTN History: 1 Diabetes History: 0 Stroke History: 0 Vascular Disease History: 1 Age Score: 2 Gender Score: 1     CrCl 52 (with adjusted body weight) Platelet count 285   Per office protocol, patient can hold Xarelto for 3 days prior to procedure.   Patient will not need bridging with Lovenox (enoxaparin) around procedure.   Patient should restart Xarelto on the evening of procedure or day after, at discretion of procedure MD.   Therefore, based on ACC/AHA guidelines, the patient would be at acceptable risk for the planned  procedure without further cardiovascular testing.    I will route this recommendation to the requesting party via Epic fax function and remove from pre-op pool. Please call with questions.  Tami Lin Christyl Osentoski, PA 10/07/2020, 8:53 AM

## 2020-10-07 NOTE — Telephone Encounter (Signed)
Clearance notes have been faxed to requesting office.

## 2020-10-08 LAB — URINE CULTURE

## 2020-10-09 ENCOUNTER — Other Ambulatory Visit: Payer: Self-pay | Admitting: Cardiology

## 2020-10-10 ENCOUNTER — Telehealth: Payer: Self-pay

## 2020-10-10 ENCOUNTER — Other Ambulatory Visit: Payer: Self-pay

## 2020-10-10 DIAGNOSIS — K1230 Oral mucositis (ulcerative), unspecified: Secondary | ICD-10-CM

## 2020-10-10 MED ORDER — MAGIC MOUTHWASH: 15.0000 mL | Freq: Two times a day (BID) | ORAL | 2 refills | Status: DC

## 2020-10-10 NOTE — Telephone Encounter (Signed)
Patient called to continue to medication- patient voiced that her mouth :" is coated in white film" discussed with Dr. Alyson Ingles-  New order for magic mouthwash ordered and sent to pharmacy. Patient voiced understanding.

## 2020-10-10 NOTE — Telephone Encounter (Signed)
-----   Message from Cleon Gustin, MD sent at 10/10/2020 10:23 AM EDT ----- Continue keflex ----- Message ----- From: Dorisann Frames, RN Sent: 10/10/2020   9:40 AM EDT To: Cleon Gustin, MD  Patient placed on keflex 9/28

## 2020-10-14 NOTE — Patient Instructions (Addendum)
Your procedure is scheduled on: 10/20/2020  Report to [facility] at 7:30    AM.  Call this number if you have problems the morning of surgery: [phone]   Remember:   Do not Eat or Drink after midnight         No Smoking the morning of surgery  :  Take these medicines the morning of surgery with A SIP OF WATER: Bisoprolol              Hold Xarelto 3 day prior to procedure per cardiology clearance note   Do not wear jewelry, make-up or nail polish.  Do not wear lotions, powders, or perfumes. You may wear deodorant.  Do not shave 48 hours prior to surgery. Men may shave face and neck.  Do not bring valuables to the hospital.  Contacts, dentures or bridgework may not be worn into surgery.  Leave suitcase in the car. After surgery it may be brought to your room.  For patients admitted to the hospital, checkout time is 11:00 AM the day of discharge.   Patients discharged the day of surgery will not be allowed to drive home.    Special Instructions: Shower using CHG night before surgery and shower the day of surgery use CHG.  Use special wash - you have one bottle of CHG for all showers.  You should use approximately 1/2 of the bottle for each shower.  How to Use Chlorhexidine for Bathing Chlorhexidine gluconate (CHG) is a germ-killing (antiseptic) solution that is used to clean the skin. It can get rid of the bacteria that normally live on the skin and can keep them away for about 24 hours. To clean your skin with CHG, you may be given: A CHG solution to use in the shower or as part of a sponge bath. A prepackaged cloth that contains CHG. Cleaning your skin with CHG may help lower the risk for infection: While you are staying in the intensive care unit of the hospital. If you have a vascular access, such as a central line, to provide short-term or long-term access to your veins. If you have a catheter to drain urine from your bladder. If you are on a ventilator. A  ventilator is a machine that helps you breathe by moving air in and out of your lungs. After surgery. What are the risks? Risks of using CHG include: A skin reaction. Hearing loss, if CHG gets in your ears and you have a perforated eardrum. Eye injury, if CHG gets in your eyes and is not rinsed out. The CHG product catching fire. Make sure that you avoid smoking and flames after applying CHG to your skin. Do not use CHG: If you have a chlorhexidine allergy or have previously reacted to chlorhexidine. On babies younger than 19 months of age. How to use CHG solution Use CHG only as told by your health care provider, and follow the instructions on the label. Use the full amount of CHG as directed. Usually, this is one bottle. During a shower Follow these steps when using CHG solution during a shower (unless your health care provider gives you different instructions): Start the shower. Use your normal soap and shampoo to wash your face and hair. Turn off the shower or move out of the shower stream. Pour the CHG onto a clean washcloth. Do not use any type of brush or rough-edged sponge. Starting at your neck, lather your body down to your toes. Make sure you follow  these instructions: If you will be having surgery, pay special attention to the part of your body where you will be having surgery. Scrub this area for at least 1 minute. Do not use CHG on your head or face. If the solution gets into your ears or eyes, rinse them well with water. Avoid your genital area. Avoid any areas of skin that have broken skin, cuts, or scrapes. Scrub your back and under your arms. Make sure to wash skin folds. Let the lather sit on your skin for 1-2 minutes or as long as told by your health care provider. Thoroughly rinse your entire body in the shower. Make sure that all body creases and crevices are rinsed well. Dry off with a clean towel. Do not put any substances on your body afterward--such as powder,  lotion, or perfume--unless you are told to do so by your health care provider. Only use lotions that are recommended by the manufacturer. Put on clean clothes or pajamas. If it is the night before your surgery, sleep in clean sheets.  During a sponge bath Follow these steps when using CHG solution during a sponge bath (unless your health care provider gives you different instructions): Use your normal soap and shampoo to wash your face and hair. Pour the CHG onto a clean washcloth. Starting at your neck, lather your body down to your toes. Make sure you follow these instructions: If you will be having surgery, pay special attention to the part of your body where you will be having surgery. Scrub this area for at least 1 minute. Do not use CHG on your head or face. If the solution gets into your ears or eyes, rinse them well with water. Avoid your genital area. Avoid any areas of skin that have broken skin, cuts, or scrapes. Scrub your back and under your arms. Make sure to wash skin folds. Let the lather sit on your skin for 1-2 minutes or as long as told by your health care provider. Using a different clean, wet washcloth, thoroughly rinse your entire body. Make sure that all body creases and crevices are rinsed well. Dry off with a clean towel. Do not put any substances on your body afterward--such as powder, lotion, or perfume--unless you are told to do so by your health care provider. Only use lotions that are recommended by the manufacturer. Put on clean clothes or pajamas. If it is the night before your surgery, sleep in clean sheets. How to use CHG prepackaged cloths Only use CHG cloths as told by your health care provider, and follow the instructions on the label. Use the CHG cloth on clean, dry skin. Do not use the CHG cloth on your head or face unless your health care provider tells you to. When washing with the CHG cloth: Avoid your genital area. Avoid any areas of skin that have  broken skin, cuts, or scrapes. Before surgery Follow these steps when using a CHG cloth to clean before surgery (unless your health care provider gives you different instructions): Using the CHG cloth, vigorously scrub the part of your body where you will be having surgery. Scrub using a back-and-forth motion for 3 minutes. The area on your body should be completely wet with CHG when you are done scrubbing. Do not rinse. Discard the cloth and let the area air-dry. Do not put any substances on the area afterward, such as powder, lotion, or perfume. Put on clean clothes or pajamas. If it is the night before your  surgery, sleep in clean sheets.  For general bathing Follow these steps when using CHG cloths for general bathing (unless your health care provider gives you different instructions). Use a separate CHG cloth for each area of your body. Make sure you wash between any folds of skin and between your fingers and toes. Wash your body in the following order, switching to a new cloth after each step: The front of your neck, shoulders, and chest. Both of your arms, under your arms, and your hands. Your stomach and groin area, avoiding the genitals. Your right leg and foot. Your left leg and foot. The back of your neck, your back, and your buttocks. Do not rinse. Discard the cloth and let the area air-dry. Do not put any substances on your body afterward--such as powder, lotion, or perfume--unless you are told to do so by your health care provider. Only use lotions that are recommended by the manufacturer. Put on clean clothes or pajamas. Contact a health care provider if: Your skin gets irritated after scrubbing. You have questions about using your solution or cloth. You swallow any chlorhexidine. Call your local poison control center (1-815-797-1382 in the [place] Get help right away if: Your eyes itch badly, or they become very red or swollen. Your skin itches badly and is red or  swollen. Your hearing changes. You have trouble seeing. You have swelling or tingling in your mouth or throat. You have trouble breathing. These symptoms may represent a serious problem that is an emergency. Do not wait to see if the symptoms will go away. Get medical help right away. Call your local emergency services (911 in the [place] Do not drive yourself to the hospital. Summary Chlorhexidine gluconate (CHG) is a germ-killing (antiseptic) solution that is used to clean the skin. Cleaning your skin with CHG may help to lower your risk for infection. You may be given CHG to use for bathing. It may be in a bottle or in a prepackaged cloth to use on your skin. Carefully follow your health care provider's instructions and the instructions on the product label. Do not use CHG if you have a chlorhexidine allergy. Contact your health care provider if your skin gets irritated after scrubbing. This information is not intended to replace advice given to you by your health care provider. Make sure you discuss any questions you have with your health care provider. Document Revised: 03/07/2020 Document Reviewed: 03/07/2020 Elsevier Patient Education  2022 [place]. Transurethral Resection of Bladder Tumor, Care After This sheet gives you information about how to care for yourself after your procedure. Your health care provider may also give you more specific instructions. If you have problems or questions, contact your health care provider. What can I expect after the procedure? After the procedure, it is common to have: A small amount of blood in your urine for up to 2 weeks. Soreness or mild pain from your catheter. After your catheter is removed, you may have mild soreness, especially when urinating. Pain in your lower abdomen. Follow these instructions at home: Medicines  Take over-the-counter and prescription medicines only as told by your health care provider. If you were prescribed an  antibiotic medicine, take it as told by your health care provider. Do not stop taking the antibiotic even if you start to feel better. Do not drive for 24 hours if you were given a sedative during your procedure. Ask your health care provider if the medicine prescribed to you: Requires you to avoid driving  or using heavy machinery. Can cause constipation. You may need to take these actions to prevent or treat constipation: Take over-the-counter or prescription medicines. Eat foods that are high in fiber, such as beans, whole grains, and fresh fruits and vegetables. Limit foods that are high in fat and processed sugars, such as fried or sweet foods. Activity Return to your normal activities as told by your health care provider. Ask your health care provider what activities are safe for you. Do not lift anything that is heavier than 10 lb (4.5 kg), or the limit that you are told, until your health care provider says that it is safe. Avoid intense physical activity for as long as told by your health care provider. Rest as told by your health care provider. Avoid sitting for a long time without moving. Get up to take short walks every 1-2 hours. This is important to improve blood flow and breathing. Ask for help if you feel weak or unsteady. General instructions  Do not drink alcohol for as long as told by your health care provider. This is especially important if you are taking prescription pain medicines. Do not take baths, swim, or use a hot tub until your health care provider approves. Ask your health care provider if you may take showers. You may only be allowed to take sponge baths. If you have a catheter, follow instructions from your health care provider about caring for your catheter and your drainage bag. Drink enough fluid to keep your urine pale yellow. Wear compression stockings as told by your health care provider. These stockings help to prevent blood clots and reduce swelling in your  legs. Keep all follow-up visits as told by your health care provider. This is important. You will need to be followed closely with regular checks of your bladder and urethra (cystoscopies) to make sure that the cancer does not come back. Contact a health care provider if: You have pain that gets worse or does not improve with medicine. You have blood in your urine for more than 2 weeks. You have cloudy or bad-smelling urine. You become constipated. Signs of constipation may include having: Fewer than three bowel movements in a week. Difficulty having a bowel movement. Stools that are dry, hard, or larger than normal. You have a fever. Get help right away if: You have: Severe pain. Bright red blood in your urine. Blood clots in your urine. A lot of blood in your urine. Your catheter has been removed and you are not able to urinate. You have a catheter in place and the catheter is not draining urine. Summary After your procedure, it is common to have a small amount of blood in your urine, soreness or mild pain from your catheter, and pain in your lower abdomen. Take over-the-counter and prescription medicines only as told by your health care provider. Rest as told by your health care provider. Follow your health care provider's instructions about returning to normal activities. Ask what activities are safe for you. If you have a catheter, follow instructions from your health care provider about caring for your catheter and your drainage bag. Get help right away if you cannot urinate, you have severe pain, or you have bright red blood or blood clots in your urine. This information is not intended to replace advice given to you by your health care provider. Make sure you discuss any questions you have with your health care provider. Document Revised: 07/25/2017 Document Reviewed: 07/25/2017 Elsevier Patient Education  2022  [place] Anesthesia, Adult, Care After This sheet  gives you information about how to care for yourself after your procedure. Your health care provider may also give you more specific instructions. If you have problems or questions, contact your health care provider. What can I expect after the procedure? After the procedure, the following side effects are common: Pain or discomfort at the IV site. Nausea. Vomiting. Sore throat. Trouble concentrating. Feeling cold or chills. Feeling weak or tired. Sleepiness and fatigue. Soreness and body aches. These side effects can affect parts of the body that were not involved in surgery. Follow these instructions at home: For the time period you were told by your health care provider:  Rest. Do not participate in activities where you could fall or become injured. Do not drive or use machinery. Do not drink alcohol. Do not take sleeping pills or medicines that cause drowsiness. Do not make important decisions or sign legal documents. Do not take care of children on your own. Eating and drinking Follow any instructions from your health care provider about eating or drinking restrictions. When you feel hungry, start by eating small amounts of foods that are soft and easy to digest (bland), such as toast. Gradually return to your regular diet. Drink enough fluid to keep your urine pale yellow. If you vomit, rehydrate by drinking water, juice, or clear broth. General instructions If you have sleep apnea, surgery and certain medicines can increase your risk for breathing problems. Follow instructions from your health care provider about wearing your sleep device: Anytime you are sleeping, including during daytime naps. While taking prescription pain medicines, sleeping medicines, or medicines that make you drowsy. Have a responsible adult stay with you for the time you are told. It is important to have someone help care for you until you are awake and alert. Return to your normal activities as told by  your health care provider. Ask your health care provider what activities are safe for you. Take over-the-counter and prescription medicines only as told by your health care provider. If you smoke, do not smoke without supervision. Keep all follow-up visits as told by your health care provider. This is important. Contact a health care provider if: You have nausea or vomiting that does not get better with medicine. You cannot eat or drink without vomiting. You have pain that does not get better with medicine. You are unable to pass urine. You develop a skin rash. You have a fever. You have redness around your IV site that gets worse. Get help right away if: You have difficulty breathing. You have chest pain. You have blood in your urine or stool, or you vomit blood. Summary After the procedure, it is common to have a sore throat or nausea. It is also common to feel tired. Have a responsible adult stay with you for the time you are told. It is important to have someone help care for you until you are awake and alert. When you feel hungry, start by eating small amounts of foods that are soft and easy to digest (bland), such as toast. Gradually return to your regular diet. Drink enough fluid to keep your urine pale yellow. Return to your normal activities as told by your health care provider. Ask your health care provider what activities are safe for you. This information is not intended to replace advice given to you by your health care provider. Make sure you discuss any questions you have with your health care provider. Document  Revised: 09/10/2019 Document Reviewed: 04/09/2019 Elsevier Patient Education  [place].

## 2020-10-18 ENCOUNTER — Telehealth: Payer: Self-pay

## 2020-10-18 ENCOUNTER — Encounter (HOSPITAL_COMMUNITY): Payer: Self-pay

## 2020-10-18 ENCOUNTER — Encounter (HOSPITAL_COMMUNITY)
Admission: RE | Admit: 2020-10-18 | Discharge: 2020-10-18 | Disposition: A | Discharge status: Home / Self Care | Payer: Medicare Other | Source: Ambulatory Visit | Attending: Urology | Admitting: Urology

## 2020-10-18 DIAGNOSIS — B370 Candidal stomatitis: Secondary | ICD-10-CM | POA: Diagnosis not present

## 2020-10-18 DIAGNOSIS — Z6838 Body mass index (BMI) 38.0-38.9, adult: Secondary | ICD-10-CM | POA: Diagnosis not present

## 2020-10-18 NOTE — Telephone Encounter (Signed)
Patient called stating thrush was much worse. She states, "My mouth is on fire." She did not take the magic mouthwash you prescribed. She took clotrimazole tablets prescribed by her pcp. She called and canceled her preop this morning. Patient has appointment with PCP this morning. Patient will call office back to reschedule procedure.

## 2020-10-19 ENCOUNTER — Telehealth: Payer: Self-pay | Admitting: Cardiology

## 2020-10-19 NOTE — Telephone Encounter (Signed)
Followed up w/ pt She does not want to stop it if at all possible. She understands that if she holds Tikosyn she will need to be readmitted to restart it.  She is going to further discuss options/alternatives w/ MD. She will let us know if decision to stop Tikosyn occurs.

## 2020-10-19 NOTE — Telephone Encounter (Signed)
Pt c/o medication issue:  1. Name of Medication:  dofetilide (TIKOSYN) 250 MCG capsule  2. How are you currently taking this medication (dosage and times per day)?  As prescribed   3. Are you having a reaction (difficulty breathing--STAT)?  No   4. What is your medication issue?   Patient states her Urologist found cancer and she also had a urine infection that she wasn't aware of. Urologist prescribed an antibiotic, but it broke her mouth out. She was placed on another medication for her mouth, but she still hasn't had any relief. She states yesterday her mouth was burning and it is not healing at all. Per Urologist, there is an alternative, but she will need to hold her Tikosyn in order to take it. Patient would like to know what Dr. Curt Bears recommends.

## 2020-10-20 ENCOUNTER — Encounter (INDEPENDENT_AMBULATORY_CARE_PROVIDER_SITE_OTHER): Payer: Self-pay | Admitting: *Deleted

## 2020-10-20 ENCOUNTER — Telehealth: Payer: Self-pay

## 2020-10-20 NOTE — Telephone Encounter (Signed)
I called patient to have surgery rescheduled for TURBT-  Patient stated she did not have magic mouthwash delivered for there thrush and has been dealing with it for over a week.  Encouraged patient to have magic mouthwash delivered and to take as instructed to help clear the thrush in her mouth. Patient voiced understanding.  Patient surgery rescheduled for 11/07/2020

## 2020-10-24 ENCOUNTER — Telehealth: Payer: Self-pay | Admitting: Urology

## 2020-10-24 ENCOUNTER — Other Ambulatory Visit: Payer: Self-pay

## 2020-10-24 ENCOUNTER — Other Ambulatory Visit: Payer: Medicare Other

## 2020-10-24 DIAGNOSIS — R3915 Urgency of urination: Secondary | ICD-10-CM | POA: Diagnosis not present

## 2020-10-24 LAB — URINALYSIS, ROUTINE W REFLEX MICROSCOPIC
Bilirubin, UA: NEGATIVE
Glucose, UA: NEGATIVE
Ketones, UA: NEGATIVE
Leukocytes,UA: NEGATIVE
Nitrite, UA: NEGATIVE
Protein,UA: NEGATIVE
RBC, UA: NEGATIVE
Specific Gravity, UA: 1.015 (ref 1.005–1.030)
Urobilinogen, Ur: 0.2 mg/dL (ref 0.2–1.0)
pH, UA: 5 (ref 5.0–7.5)

## 2020-10-24 NOTE — Telephone Encounter (Signed)
Patient's daughter, Caryl Asp [phone]), also called to discuss patient's symptoms.

## 2020-10-24 NOTE — Telephone Encounter (Signed)
Daughter made aware that patient had two refills on magic mouthwash.

## 2020-10-24 NOTE — Telephone Encounter (Signed)
Returned call to daughter Caryl Asp) and patient will come drop off urine specimen @ 1pm. Daughter stated patients thrush has gotten much better but is not completely gone and request refill on magic mouthwash. Refill sent.

## 2020-10-24 NOTE — Telephone Encounter (Signed)
Patient is scheduled for surgery on the 31st.  She is experiencing pressure and burning.    Should we bring her in for a urine sample today?  She is still experiencing thrush and would like more meds if possible.

## 2020-10-25 ENCOUNTER — Ambulatory Visit: Payer: Medicare Other | Admitting: Internal Medicine

## 2020-10-26 ENCOUNTER — Ambulatory Visit: Payer: Medicare Other | Admitting: Urology

## 2020-10-26 DIAGNOSIS — R3915 Urgency of urination: Secondary | ICD-10-CM

## 2020-10-29 LAB — URINE CULTURE

## 2020-11-02 NOTE — Patient Instructions (Signed)
RONNI OSTERBERG  11/02/2020     @PREFPERIOPPHARMACY @   Your procedure is scheduled on  11/07/2020.   Report to [facility] at  [place]  A.M.   Call this number if you have problems the morning of surgery:  [phone]   Remember:  Do not eat or drink after midnight.      Take these medicines the morning of surgery with A SIP OF WATER           balsalazide, bisoprolol, tikosyn, entresto.     Do not wear jewelry, make-up or nail polish.  Do not wear lotions, powders, or perfumes, or deodorant.  Do not shave 48 hours prior to surgery.  Men may shave face and neck.  Do not bring valuables to the hospital.  [facility] is not responsible for any belongings or valuables.  Contacts, dentures or bridgework may not be worn into surgery.  Leave your suitcase in the car.  After surgery it may be brought to your room.  For patients admitted to the hospital, discharge time will be determined by your treatment team.  Patients discharged the day of surgery will not be allowed to drive home and must have someone with them for 24 hours.    Special instructions:   DO NOT smoke tobacco or vape for 24 hours before your procedure.  Please read over the following fact sheets that you were given. Coughing and Deep Breathing, Surgical Site Infection Prevention, Anesthesia Post-op Instructions, and Care and Recovery After Surgery      Transurethral Resection of Bladder Tumor, Care After This sheet gives you information about how to care for yourself after your procedure. Your health care provider may also give you more specific instructions. If you have problems or questions, contact your health care provider. What can I expect after the procedure? After the procedure, it is common to have: A small amount of blood in your urine for up to 2 weeks. Soreness or mild pain from your catheter. After your catheter is removed, you may have mild soreness, especially when urinating. Pain  in your lower abdomen. Follow these instructions at home: Medicines  Take over-the-counter and prescription medicines only as told by your health care provider. If you were prescribed an antibiotic medicine, take it as told by your health care provider. Do not stop taking the antibiotic even if you start to feel better. Do not drive for 24 hours if you were given a sedative during your procedure. Ask your health care provider if the medicine prescribed to you: Requires you to avoid driving or using heavy machinery. Can cause constipation. You may need to take these actions to prevent or treat constipation: Take over-the-counter or prescription medicines. Eat foods that are high in fiber, such as beans, whole grains, and fresh fruits and vegetables. Limit foods that are high in fat and processed sugars, such as fried or sweet foods. Activity Return to your normal activities as told by your health care provider. Ask your health care provider what activities are safe for you. Do not lift anything that is heavier than 10 lb (4.5 kg), or the limit that you are told, until your health care provider says that it is safe. Avoid intense physical activity for as long as told by your health care provider. Rest as told by your health care provider. Avoid sitting for a long time without moving. Get up to take short walks every 1-2 hours. This is  important to improve blood flow and breathing. Ask for help if you feel weak or unsteady. General instructions  Do not drink alcohol for as long as told by your health care provider. This is especially important if you are taking prescription pain medicines. Do not take baths, swim, or use a hot tub until your health care provider approves. Ask your health care provider if you may take showers. You may only be allowed to take sponge baths. If you have a catheter, follow instructions from your health care provider about caring for your catheter and your drainage  bag. Drink enough fluid to keep your urine pale yellow. Wear compression stockings as told by your health care provider. These stockings help to prevent blood clots and reduce swelling in your legs. Keep all follow-up visits as told by your health care provider. This is important. You will need to be followed closely with regular checks of your bladder and urethra (cystoscopies) to make sure that the cancer does not come back. Contact a health care provider if: You have pain that gets worse or does not improve with medicine. You have blood in your urine for more than 2 weeks. You have cloudy or bad-smelling urine. You become constipated. Signs of constipation may include having: Fewer than three bowel movements in a week. Difficulty having a bowel movement. Stools that are dry, hard, or larger than normal. You have a fever. Get help right away if: You have: Severe pain. Bright red blood in your urine. Blood clots in your urine. A lot of blood in your urine. Your catheter has been removed and you are not able to urinate. You have a catheter in place and the catheter is not draining urine. Summary After your procedure, it is common to have a small amount of blood in your urine, soreness or mild pain from your catheter, and pain in your lower abdomen. Take over-the-counter and prescription medicines only as told by your health care provider. Rest as told by your health care provider. Follow your health care provider's instructions about returning to normal activities. Ask what activities are safe for you. If you have a catheter, follow instructions from your health care provider about caring for your catheter and your drainage bag. Get help right away if you cannot urinate, you have severe pain, or you have bright red blood or blood clots in your urine. This information is not intended to replace advice given to you by your health care provider. Make sure you discuss any questions you have  with your health care provider. Document Revised: 07/25/2017 Document Reviewed: 07/25/2017 Elsevier Patient Education  [place] Anesthesia, Adult, Care After This sheet gives you information about how to care for yourself after your procedure. Your health care provider may also give you more specific instructions. If you have problems or questions, contact your health care provider. What can I expect after the procedure? After the procedure, the following side effects are common: Pain or discomfort at the IV site. Nausea. Vomiting. Sore throat. Trouble concentrating. Feeling cold or chills. Feeling weak or tired. Sleepiness and fatigue. Soreness and body aches. These side effects can affect parts of the body that were not involved in surgery. Follow these instructions at home: For the time period you were told by your health care provider:  Rest. Do not participate in activities where you could fall or become injured. Do not drive or use machinery. Do not drink alcohol. Do not take sleeping pills or medicines  that cause drowsiness. Do not make important decisions or sign legal documents. Do not take care of children on your own. Eating and drinking Follow any instructions from your health care provider about eating or drinking restrictions. When you feel hungry, start by eating small amounts of foods that are soft and easy to digest (bland), such as toast. Gradually return to your regular diet. Drink enough fluid to keep your urine pale yellow. If you vomit, rehydrate by drinking water, juice, or clear broth. General instructions If you have sleep apnea, surgery and certain medicines can increase your risk for breathing problems. Follow instructions from your health care provider about wearing your sleep device: Anytime you are sleeping, including during daytime naps. While taking prescription pain medicines, sleeping medicines, or medicines that make you  drowsy. Have a responsible adult stay with you for the time you are told. It is important to have someone help care for you until you are awake and alert. Return to your normal activities as told by your health care provider. Ask your health care provider what activities are safe for you. Take over-the-counter and prescription medicines only as told by your health care provider. If you smoke, do not smoke without supervision. Keep all follow-up visits as told by your health care provider. This is important. Contact a health care provider if: You have nausea or vomiting that does not get better with medicine. You cannot eat or drink without vomiting. You have pain that does not get better with medicine. You are unable to pass urine. You develop a skin rash. You have a fever. You have redness around your IV site that gets worse. Get help right away if: You have difficulty breathing. You have chest pain. You have blood in your urine or stool, or you vomit blood. Summary After the procedure, it is common to have a sore throat or nausea. It is also common to feel tired. Have a responsible adult stay with you for the time you are told. It is important to have someone help care for you until you are awake and alert. When you feel hungry, start by eating small amounts of foods that are soft and easy to digest (bland), such as toast. Gradually return to your regular diet. Drink enough fluid to keep your urine pale yellow. Return to your normal activities as told by your health care provider. Ask your health care provider what activities are safe for you. This information is not intended to replace advice given to you by your health care provider. Make sure you discuss any questions you have with your health care provider. Document Revised: 09/10/2019 Document Reviewed: 04/09/2019 Elsevier Patient Education  2022 [employer]. How to Use Chlorhexidine for Bathing Chlorhexidine gluconate (CHG) is a  germ-killing (antiseptic) solution that is used to clean the skin. It can get rid of the bacteria that normally live on the skin and can keep them away for about 24 hours. To clean your skin with CHG, you may be given: A CHG solution to use in the shower or as part of a sponge bath. A prepackaged cloth that contains CHG. Cleaning your skin with CHG may help lower the risk for infection: While you are staying in the intensive care unit of the hospital. If you have a vascular access, such as a central line, to provide short-term or long-term access to your veins. If you have a catheter to drain urine from your bladder. If you are on a ventilator. A ventilator is  a machine that helps you breathe by moving air in and out of your lungs. After surgery. What are the risks? Risks of using CHG include: A skin reaction. Hearing loss, if CHG gets in your ears and you have a perforated eardrum. Eye injury, if CHG gets in your eyes and is not rinsed out. The CHG product catching fire. Make sure that you avoid smoking and flames after applying CHG to your skin. Do not use CHG: If you have a chlorhexidine allergy or have previously reacted to chlorhexidine. On babies younger than 48 months of age. How to use CHG solution Use CHG only as told by your health care provider, and follow the instructions on the label. Use the full amount of CHG as directed. Usually, this is one bottle. During a shower Follow these steps when using CHG solution during a shower (unless your health care provider gives you different instructions): Start the shower. Use your normal soap and shampoo to wash your face and hair. Turn off the shower or move out of the shower stream. Pour the CHG onto a clean washcloth. Do not use any type of brush or rough-edged sponge. Starting at your neck, lather your body down to your toes. Make sure you follow these instructions: If you will be having surgery, pay special attention to the part of  your body where you will be having surgery. Scrub this area for at least 1 minute. Do not use CHG on your head or face. If the solution gets into your ears or eyes, rinse them well with water. Avoid your genital area. Avoid any areas of skin that have broken skin, cuts, or scrapes. Scrub your back and under your arms. Make sure to wash skin folds. Let the lather sit on your skin for 1-2 minutes or as long as told by your health care provider. Thoroughly rinse your entire body in the shower. Make sure that all body creases and crevices are rinsed well. Dry off with a clean towel. Do not put any substances on your body afterward--such as powder, lotion, or perfume--unless you are told to do so by your health care provider. Only use lotions that are recommended by the manufacturer. Put on clean clothes or pajamas. If it is the night before your surgery, sleep in clean sheets.  During a sponge bath Follow these steps when using CHG solution during a sponge bath (unless your health care provider gives you different instructions): Use your normal soap and shampoo to wash your face and hair. Pour the CHG onto a clean washcloth. Starting at your neck, lather your body down to your toes. Make sure you follow these instructions: If you will be having surgery, pay special attention to the part of your body where you will be having surgery. Scrub this area for at least 1 minute. Do not use CHG on your head or face. If the solution gets into your ears or eyes, rinse them well with water. Avoid your genital area. Avoid any areas of skin that have broken skin, cuts, or scrapes. Scrub your back and under your arms. Make sure to wash skin folds. Let the lather sit on your skin for 1-2 minutes or as long as told by your health care provider. Using a different clean, wet washcloth, thoroughly rinse your entire body. Make sure that all body creases and crevices are rinsed well. Dry off with a clean towel. Do not  put any substances on your body afterward--such as powder, lotion, or perfume--unless  you are told to do so by your health care provider. Only use lotions that are recommended by the manufacturer. Put on clean clothes or pajamas. If it is the night before your surgery, sleep in clean sheets. How to use CHG prepackaged cloths Only use CHG cloths as told by your health care provider, and follow the instructions on the label. Use the CHG cloth on clean, dry skin. Do not use the CHG cloth on your head or face unless your health care provider tells you to. When washing with the CHG cloth: Avoid your genital area. Avoid any areas of skin that have broken skin, cuts, or scrapes. Before surgery Follow these steps when using a CHG cloth to clean before surgery (unless your health care provider gives you different instructions): Using the CHG cloth, vigorously scrub the part of your body where you will be having surgery. Scrub using a back-and-forth motion for 3 minutes. The area on your body should be completely wet with CHG when you are done scrubbing. Do not rinse. Discard the cloth and let the area air-dry. Do not put any substances on the area afterward, such as powder, lotion, or perfume. Put on clean clothes or pajamas. If it is the night before your surgery, sleep in clean sheets.  For general bathing Follow these steps when using CHG cloths for general bathing (unless your health care provider gives you different instructions). Use a separate CHG cloth for each area of your body. Make sure you wash between any folds of skin and between your fingers and toes. Wash your body in the following order, switching to a new cloth after each step: The front of your neck, shoulders, and chest. Both of your arms, under your arms, and your hands. Your stomach and groin area, avoiding the genitals. Your right leg and foot. Your left leg and foot. The back of your neck, your back, and your buttocks. Do not  rinse. Discard the cloth and let the area air-dry. Do not put any substances on your body afterward--such as powder, lotion, or perfume--unless you are told to do so by your health care provider. Only use lotions that are recommended by the manufacturer. Put on clean clothes or pajamas. Contact a health care provider if: Your skin gets irritated after scrubbing. You have questions about using your solution or cloth. You swallow any chlorhexidine. Call your local poison control center (1-(534)331-6465 in the [place] Get help right away if: Your eyes itch badly, or they become very red or swollen. Your skin itches badly and is red or swollen. Your hearing changes. You have trouble seeing. You have swelling or tingling in your mouth or throat. You have trouble breathing. These symptoms may represent a serious problem that is an emergency. Do not wait to see if the symptoms will go away. Get medical help right away. Call your local emergency services (911 in the [place] Do not drive yourself to the hospital. Summary Chlorhexidine gluconate (CHG) is a germ-killing (antiseptic) solution that is used to clean the skin. Cleaning your skin with CHG may help to lower your risk for infection. You may be given CHG to use for bathing. It may be in a bottle or in a prepackaged cloth to use on your skin. Carefully follow your health care provider's instructions and the instructions on the product label. Do not use CHG if you have a chlorhexidine allergy. Contact your health care provider if your skin gets irritated after scrubbing. This information is not intended  to replace advice given to you by your health care provider. Make sure you discuss any questions you have with your health care provider. Document Revised: 03/07/2020 Document Reviewed: 03/07/2020 Elsevier Patient Education  2022 [employer].

## 2020-11-02 NOTE — Progress Notes (Signed)
Results sent via my chart 

## 2020-11-03 ENCOUNTER — Other Ambulatory Visit: Payer: Self-pay

## 2020-11-03 ENCOUNTER — Encounter (HOSPITAL_COMMUNITY)
Admission: RE | Admit: 2020-11-03 | Discharge: 2020-11-03 | Disposition: A | Discharge status: Home / Self Care | Payer: Medicare Other | Source: Ambulatory Visit | Attending: Urology | Admitting: Urology

## 2020-11-03 ENCOUNTER — Encounter (HOSPITAL_COMMUNITY): Payer: Self-pay

## 2020-11-03 DIAGNOSIS — Z01812 Encounter for preprocedural laboratory examination: Secondary | ICD-10-CM | POA: Diagnosis not present

## 2020-11-03 HISTORY — DX: Presence of cardiac pacemaker: Z95.0

## 2020-11-03 LAB — BASIC METABOLIC PANEL
Anion gap: 8 (ref 5–15)
BUN: 21 mg/dL (ref 8–23)
CO2: 24 mmol/L (ref 22–32)
Calcium: 9.2 mg/dL (ref 8.9–10.3)
Chloride: 104 mmol/L (ref 98–111)
Creatinine, Ser: 0.76 mg/dL (ref 0.44–1.00)
GFR, Estimated: >60 mL/min (ref >60)
Glucose, Bld: 104 mg/dL — ABNORMAL HIGH (ref 70–99)
Potassium: 4 mmol/L (ref 3.5–5.1)
Sodium: 136 mmol/L (ref 135–145)

## 2020-11-03 LAB — CBC WITH DIFFERENTIAL/PLATELET
Abs Immature Granulocytes: 0.05 10*3/uL (ref 0.00–0.07)
Basophils Absolute: 0.1 10*3/uL (ref 0.0–0.1)
Basophils Relative: 1 %
Eosinophils Absolute: 0.2 10*3/uL (ref 0.0–0.5)
Eosinophils Relative: 2 %
HCT: 38.2 % (ref 36.0–46.0)
Hemoglobin: 12.4 g/dL (ref 12.0–15.0)
Immature Granulocytes: 1 %
Lymphocytes Relative: 25 %
Lymphs Abs: 2.1 10*3/uL (ref 0.7–4.0)
MCH: 30.2 pg (ref 26.0–34.0)
MCHC: 32.5 g/dL (ref 30.0–36.0)
MCV: 92.9 fL (ref 80.0–100.0)
Monocytes Absolute: 0.7 10*3/uL (ref 0.1–1.0)
Monocytes Relative: 9 %
Neutro Abs: 5.3 10*3/uL (ref 1.7–7.7)
Neutrophils Relative %: 62 %
Platelets: 257 10*3/uL (ref 150–400)
RBC: 4.11 MIL/uL (ref 3.87–5.11)
RDW: 14.6 % (ref 11.5–15.5)
WBC: 8.4 10*3/uL (ref 4.0–10.5)
nRBC: 0 % (ref 0.0–0.2)

## 2020-11-07 ENCOUNTER — Encounter (HOSPITAL_COMMUNITY): Payer: Self-pay | Admitting: Urology

## 2020-11-07 ENCOUNTER — Ambulatory Visit (HOSPITAL_COMMUNITY)
Admission: RE | Admit: 2020-11-07 | Discharge: 2020-11-07 | Disposition: A | Discharge status: Home / Self Care | Payer: Medicare Other | Attending: Urology | Admitting: Urology

## 2020-11-07 ENCOUNTER — Ambulatory Visit (HOSPITAL_COMMUNITY): Payer: Medicare Other | Admitting: Anesthesiology

## 2020-11-07 ENCOUNTER — Encounter (HOSPITAL_COMMUNITY)
Admission: RE | Disposition: A | Discharge status: Home / Self Care | Payer: Self-pay | Source: Home / Self Care | Attending: Urology

## 2020-11-07 ENCOUNTER — Other Ambulatory Visit: Payer: Self-pay | Admitting: Cardiology

## 2020-11-07 DIAGNOSIS — Z885 Allergy status to narcotic agent status: Secondary | ICD-10-CM | POA: Diagnosis not present

## 2020-11-07 DIAGNOSIS — I4891 Unspecified atrial fibrillation: Secondary | ICD-10-CM | POA: Insufficient documentation

## 2020-11-07 DIAGNOSIS — N3090 Cystitis, unspecified without hematuria: Secondary | ICD-10-CM | POA: Diagnosis not present

## 2020-11-07 DIAGNOSIS — D303 Benign neoplasm of bladder: Secondary | ICD-10-CM | POA: Diagnosis not present

## 2020-11-07 DIAGNOSIS — I10 Essential (primary) hypertension: Secondary | ICD-10-CM | POA: Insufficient documentation

## 2020-11-07 DIAGNOSIS — Z955 Presence of coronary angioplasty implant and graft: Secondary | ICD-10-CM | POA: Diagnosis not present

## 2020-11-07 DIAGNOSIS — I429 Cardiomyopathy, unspecified: Secondary | ICD-10-CM | POA: Insufficient documentation

## 2020-11-07 DIAGNOSIS — I2510 Atherosclerotic heart disease of native coronary artery without angina pectoris: Secondary | ICD-10-CM | POA: Diagnosis not present

## 2020-11-07 DIAGNOSIS — Z880 Allergy status to penicillin: Secondary | ICD-10-CM | POA: Insufficient documentation

## 2020-11-07 DIAGNOSIS — D494 Neoplasm of unspecified behavior of bladder: Secondary | ICD-10-CM | POA: Diagnosis not present

## 2020-11-07 DIAGNOSIS — Z950 Presence of cardiac pacemaker: Secondary | ICD-10-CM | POA: Insufficient documentation

## 2020-11-07 DIAGNOSIS — Z8551 Personal history of malignant neoplasm of bladder: Secondary | ICD-10-CM | POA: Diagnosis not present

## 2020-11-07 DIAGNOSIS — E785 Hyperlipidemia, unspecified: Secondary | ICD-10-CM | POA: Insufficient documentation

## 2020-11-07 HISTORY — PX: TRANSURETHRAL RESECTION OF BLADDER TUMOR: SHX2575

## 2020-11-07 HISTORY — PX: CYSTOSCOPY: SHX5120

## 2020-11-07 SURGERY — CYSTOSCOPY
Anesthesia: General | Site: Bladder

## 2020-11-07 MED ORDER — ORAL CARE MOUTH RINSE: 15.0000 mL | Freq: Once | OROMUCOSAL | Status: AC

## 2020-11-07 MED ORDER — ONDANSETRON HCL 4 MG/2ML IJ SOLN
INTRAMUSCULAR | Status: DC | PRN
  Administered 2020-11-07: 4 mg via INTRAVENOUS

## 2020-11-07 MED ORDER — ONDANSETRON HCL 4 MG/2ML IJ SOLN
INTRAMUSCULAR | Status: AC
  Filled 2020-11-07: qty 2

## 2020-11-07 MED ORDER — FENTANYL CITRATE PF 50 MCG/ML IJ SOSY: 25.0000 ug | PREFILLED_SYRINGE | INTRAMUSCULAR | Status: DC | PRN

## 2020-11-07 MED ORDER — SUGAMMADEX SODIUM 500 MG/5ML IV SOLN
INTRAVENOUS | Status: AC
  Filled 2020-11-07: qty 5

## 2020-11-07 MED ORDER — LIDOCAINE HCL (PF) 2 % IJ SOLN
INTRAMUSCULAR | Status: AC
  Filled 2020-11-07: qty 5

## 2020-11-07 MED ORDER — PROPOFOL 10 MG/ML IV BOLUS
INTRAVENOUS | Status: AC
  Filled 2020-11-07: qty 40

## 2020-11-07 MED ORDER — DIATRIZOATE MEGLUMINE 30 % UR SOLN
URETHRAL | Status: AC
  Filled 2020-11-07: qty 100

## 2020-11-07 MED ORDER — TRAMADOL HCL 50 MG PO TABS: 50.0000 mg | ORAL_TABLET | Freq: Four times a day (QID) | ORAL | 0 refills | Status: DC | PRN

## 2020-11-07 MED ORDER — LACTATED RINGERS IV SOLN
INTRAVENOUS | Status: DC
  Administered 2020-11-07: 1000 mL via INTRAVENOUS

## 2020-11-07 MED ORDER — PROPOFOL 10 MG/ML IV BOLUS
INTRAVENOUS | Status: DC | PRN
  Administered 2020-11-07: 100 mg via INTRAVENOUS

## 2020-11-07 MED ORDER — SUGAMMADEX SODIUM 500 MG/5ML IV SOLN
INTRAVENOUS | Status: DC | PRN
  Administered 2020-11-07: 300 mg via INTRAVENOUS

## 2020-11-07 MED ORDER — ONDANSETRON HCL 4 MG/2ML IJ SOLN: 4.0000 mg | Freq: Once | INTRAMUSCULAR | Status: DC | PRN

## 2020-11-07 MED ORDER — LIDOCAINE 2% (20 MG/ML) 5 ML SYRINGE
INTRAMUSCULAR | Status: DC | PRN
  Administered 2020-11-07: 60 mg via INTRAVENOUS

## 2020-11-07 MED ORDER — SODIUM CHLORIDE 0.9 % IR SOLN
Status: DC | PRN
  Administered 2020-11-07 (×2): 3000 mL

## 2020-11-07 MED ORDER — STERILE WATER FOR IRRIGATION IR SOLN
Status: DC | PRN
  Administered 2020-11-07: 1000 mL

## 2020-11-07 MED ORDER — ROCURONIUM BROMIDE 10 MG/ML (PF) SYRINGE
PREFILLED_SYRINGE | INTRAVENOUS | Status: DC | PRN
  Administered 2020-11-07: 50 mg via INTRAVENOUS

## 2020-11-07 MED ORDER — FENTANYL CITRATE (PF) 100 MCG/2ML IJ SOLN
INTRAMUSCULAR | Status: DC | PRN
  Administered 2020-11-07 (×2): 25 ug via INTRAVENOUS

## 2020-11-07 MED ORDER — CEFAZOLIN SODIUM-DEXTROSE 2-4 GM/100ML-% IV SOLN
2.0000 g | INTRAVENOUS | Status: AC
  Administered 2020-11-07: 2 g via INTRAVENOUS
  Filled 2020-11-07: qty 100

## 2020-11-07 MED ORDER — CHLORHEXIDINE GLUCONATE 0.12 % MT SOLN
15.0000 mL | Freq: Once | OROMUCOSAL | Status: AC
  Administered 2020-11-07: 15 mL via OROMUCOSAL
  Filled 2020-11-07: qty 15

## 2020-11-07 MED ORDER — FENTANYL CITRATE (PF) 100 MCG/2ML IJ SOLN
INTRAMUSCULAR | Status: AC
  Filled 2020-11-07: qty 2

## 2020-11-07 SURGICAL SUPPLY — 26 items
BAG DRAIN URO TABLE W/ADPT NS (BAG) ×2 IMPLANT
BAG DRN 8 ADPR NS SKTRN CSTL (BAG) ×1
BAG DRN RND TRDRP ANRFLXCHMBR (UROLOGICAL SUPPLIES) ×1
BAG HAMPER (MISCELLANEOUS) ×2 IMPLANT
BAG URINE DRAIN 2000ML AR STRL (UROLOGICAL SUPPLIES) ×2 IMPLANT
CATH FOLEY 2WAY SLVR  5CC 18FR (CATHETERS) ×2
CATH FOLEY 2WAY SLVR 5CC 18FR (CATHETERS) IMPLANT
CLOTH BEACON ORANGE TIMEOUT ST (SAFETY) ×2 IMPLANT
ELECT LOOP 22F BIPOLAR SML (ELECTROSURGICAL) ×2
ELECTRODE LOOP 22F BIPOLAR SML (ELECTROSURGICAL) ×1 IMPLANT
GLOVE SURG POLYISO LF SZ8 (GLOVE) ×2 IMPLANT
GLOVE SURG UNDER POLY LF SZ7 (GLOVE) ×4 IMPLANT
GOWN STRL REUS W/TWL LRG LVL3 (GOWN DISPOSABLE) ×4 IMPLANT
GOWN STRL REUS W/TWL XL LVL3 (GOWN DISPOSABLE) ×2 IMPLANT
IV NS IRRIG 3000ML ARTHROMATIC (IV SOLUTION) ×4 IMPLANT
KIT TURNOVER CYSTO (KITS) ×2 IMPLANT
MANIFOLD NEPTUNE II (INSTRUMENTS) ×2 IMPLANT
PACK CYSTO (CUSTOM PROCEDURE TRAY) ×2 IMPLANT
PAD ARMBOARD 7.5X6 YLW CONV (MISCELLANEOUS) ×2 IMPLANT
SYR 30ML LL (SYRINGE) ×1 IMPLANT
SYR TOOMEY IRRIG 70ML (MISCELLANEOUS)
SYRINGE TOOMEY IRRIG 70ML (MISCELLANEOUS) IMPLANT
TOWEL NATURAL 4PK STERILE (DISPOSABLE) ×2 IMPLANT
TOWEL OR 17X26 4PK STRL BLUE (TOWEL DISPOSABLE) ×2 IMPLANT
WATER STERILE IRR 3000ML UROMA (IV SOLUTION) ×2 IMPLANT
WATER STERILE IRR 500ML POUR (IV SOLUTION) ×2 IMPLANT

## 2020-11-07 NOTE — Anesthesia Preprocedure Evaluation (Signed)
Anesthesia Evaluation  Patient identified by MRN, date of birth, ID band Patient awake    Reviewed: Allergy & Precautions, H&P , NPO status , Patient's Chart, lab work & pertinent test results, reviewed documented beta blocker date and time   Airway Mallampati: II  TM Distance: >3 FB Neck ROM: full    Dental no notable dental hx.    Pulmonary neg pulmonary ROS,    Pulmonary exam normal breath sounds clear to auscultation       Cardiovascular Exercise Tolerance: Good hypertension, + CAD and +CHF  + pacemaker  Rhythm:regular Rate:Normal     Neuro/Psych PSYCHIATRIC DISORDERS Depression negative neurological ROS     GI/Hepatic Neg liver ROS, PUD,   Endo/Other  negative endocrine ROS  Renal/GU negative Renal ROS  negative genitourinary   Musculoskeletal   Abdominal   Peds  Hematology  (+) Blood dyscrasia, anemia ,   Anesthesia Other Findings 1. Endocardium poorly visualized, difficult to assess LV function.  Recommend limited contrast study to better evaluate LVEF, 30-35% is rough  estimate. . Left ventricular ejection fraction, by estimation, is 30-35%.  The left ventricle has moderate to  severely decreased function. The left ventricle demonstrates global  hypokinesis. Left ventricular diastolic parameters are consistent with  Grade I diastolic dysfunction (impaired relaxation).  2. Right ventricular systolic function is normal. The right ventricular  size is normal. There is normal pulmonary artery systolic pressure.  3. Left atrial size was severely dilated.  4. Right atrial size was mildly dilated.  5. The mitral valve is normal in structure. No evidence of mitral valve  regurgitation. No evidence of mitral stenosis.  6. The aortic valve is tricuspid. Aortic valve regurgitation is not  visualized. No aortic stenosis is present.  7. The inferior vena cava is normal in size with greater than 50%   respiratory variability, suggesting right atrial pressure of 3 mmHg.   Comparison(s): Echocardiogram done 11/06/18 showed an EF of 25%.   Reproductive/Obstetrics negative OB ROS                             Anesthesia Physical Anesthesia Plan  ASA: 3  Anesthesia Plan: General and General LMA   Post-op Pain Management:    Induction:   PONV Risk Score and Plan: Ondansetron  Airway Management Planned:   Additional Equipment:   Intra-op Plan:   Post-operative Plan:   Informed Consent: I have reviewed the patients History and Physical, chart, labs and discussed the procedure including the risks, benefits and alternatives for the proposed anesthesia with the patient or authorized representative who has indicated his/her understanding and acceptance.     Dental Advisory Given  Plan Discussed with: CRNA  Anesthesia Plan Comments:         Anesthesia Quick Evaluation

## 2020-11-07 NOTE — H&P (Signed)
Urology Admission H&P  Chief Complaint: bladder tumor  History of Present Illness: [name] is a 87yo here for bladder tumor resection. She was found to have a 1cm bladder tumor found on office cystoscopy. NO hematuria. No LUTS  Past Medical History:  Diagnosis Date   Arthritis    Atrial fibrillation [facility])    Atrial flutter ([place])    Bladder cancer ([place])    Cardiomyopathy ([place])    Coronary atherosclerosis of native coronary artery    a. BMS RCA May 2014 - [place] stent. b. Cath 04/2017 - patent stent, minimal CAD otherwise.   Depression    Dysrhythmia    Essential hypertension    GI bleeding 06/2017   a. melena/small bowel ulcer by capsule endo 06/2017.   Hyperlipemia    Hypertension    Macular degeneration    NICM (nonischemic cardiomyopathy) (HCC)    Persistent atrial fibrillation (HCC)    Presence of permanent cardiac pacemaker    Ulcerative colitis    Past Surgical History:  Procedure Laterality Date   ABDOMINAL HYSTERECTOMY     APPENDECTOMY     Bilateral knee replacements      2007, 2008   BIV PACEMAKER INSERTION CRT-P N/A 03/02/2020   Procedure: BIV PACEMAKER INSERTION CRT-P;  Surgeon: Constance Haw, MD;  Location: [place] CV LAB;  Service: Cardiovascular;  Laterality: N/A;   BLADDER SURGERY     CARDIOVERSION N/A 02/04/2017   Procedure: CARDIOVERSION;  Surgeon: Arnoldo Lenis, MD;  [facility];  Service: Endoscopy;  Laterality: N/A;   CARDIOVERSION N/A 02/26/2017   Procedure: CARDIOVERSION;  Surgeon: Satira Sark, MD;  Location: [facility];  Service: Cardiovascular;  Laterality: N/A;   CARDIOVERSION N/A 08/07/2017   Procedure: CARDIOVERSION;  Surgeon: Pixie Casino, MD;  Location: [place];  Service: Cardiovascular;  Laterality: N/A;   COLONOSCOPY N/A 06/15/2015   Procedure: COLONOSCOPY;  Surgeon: Rogene Houston, MD;  [facility];  Service: Endoscopy;  Laterality: N/A;  210   CYSTOSCOPY W/ RETROGRADES Bilateral 05/14/2016    Procedure: CYSTOSCOPY WITH BILATERAL RETROGRADE PYELOGRAM;  Surgeon: Raynelle Bring, MD;  [facility];  Service: Urology;  Laterality: Bilateral;  GENERAL ANESTHESIA WITH PARALYSIS   ESOPHAGOGASTRODUODENOSCOPY (EGD) WITH PROPOFOL N/A 06/14/2017   Procedure: ESOPHAGOGASTRODUODENOSCOPY (EGD) WITH PROPOFOL;  Surgeon: Rogene Houston, MD;  [facility];  Service: Endoscopy;  Laterality: N/A;   GIVENS CAPSULE STUDY  06/14/2017   Procedure: GIVENS CAPSULE STUDY;  Surgeon: Rogene Houston, MD;  [facility];  Service: Endoscopy;;   LEFT HEART CATHETERIZATION WITH CORONARY ANGIOGRAM N/A 10/30/2013   Procedure: LEFT HEART CATHETERIZATION WITH CORONARY ANGIOGRAM;  Surgeon: Burnell Blanks, MD;  Location: [facility] CATH LAB;  Service: Cardiovascular;  Laterality: N/A;   RIGHT/LEFT HEART CATH AND CORONARY ANGIOGRAPHY N/A 05/03/2017   Procedure: RIGHT/LEFT HEART CATH AND CORONARY ANGIOGRAPHY;  Surgeon: Leonie Man, MD;  Location: [place] CV LAB;  Service: Cardiovascular;  Laterality: N/A;   TEE WITHOUT CARDIOVERSION N/A 02/04/2017   Procedure: TRANSESOPHAGEAL ECHOCARDIOGRAM (TEE) WITH PROPOL;  Surgeon: Arnoldo Lenis, MD;  [facility];  Service: Endoscopy;  Laterality: N/A;   TONSILLECTOMY     TOTAL KNEE ARTHROPLASTY     TRANSURETHRAL RESECTION OF BLADDER TUMOR N/A 05/14/2016   Procedure: TRANSURETHRAL RESECTION OF BLADDER TUMOR (TURBT);  Surgeon: Raynelle Bring, MD;  [facility];  Service: Urology;  Laterality: N/A;  GENERAL ANESTHESIA WITH PARALYSIS   YAG LASER APPLICATION  Bilateral 11/10/2012   Procedure: YAG LASER APPLICATION;  Surgeon: Williams Che, MD;  [facility];  Service: Ophthalmology;  Laterality: Bilateral;    Home Medications:  Current Facility-Administered Medications  Medication Dose Route Frequency Provider Last Rate Last Admin   ceFAZolin (ANCEF) IVPB 2g/100 mL premix  2 g Intravenous 30 min Pre-Op Thamara Leger, Candee Furbish, MD        lactated ringers infusion   Intravenous Continuous Louann Sjogren, MD 10 mL/hr at 11/07/20 0853 1,000 mL at 11/07/20 0853   Allergies:  Allergies  Allergen Reactions   Chlor-Trimeton [Chlorpheniramine] Shortness Of Breath   Carvedilol Itching and Rash    Facial rash/itching   Demerol Rash   Penicillins Rash and Other (See Comments)    REACTION: rash, years ago Has patient had a PCN reaction causing immediate rash, facial/tongue/throat swelling, SOB or lightheadedness with hypotension: Yes Has patient had a PCN reaction causing severe rash involving mucus membranes or skin necrosis: No Has patient had a PCN reaction that required hospitalization No Has patient had a PCN reaction occurring within the last 10 years: No If all of the above answers are "NO", then may proceed with Cephalosporin use.     Family History  Problem Relation Age of Onset   CAD Father    Diabetes Father    Heart attack Father    Social History:  reports that she has never smoked. She has never used smokeless tobacco. She reports that she does not drink alcohol and does not use drugs.  Review of Systems  All other systems reviewed and are negative.  Physical Exam:  Vital signs in last 24 hours: Temp:  [98.3 F (36.8 C)] 98.3 F (36.8 C) (10/31 0832) Pulse Rate:  [60] 60 (10/31 0832) Resp:  [18] 18 (10/31 0832) BP: (138)/(63) 138/63 (10/31 0832) SpO2:  [96 %] 96 % (10/31 7425) Physical Exam Vitals reviewed.  Constitutional:      Appearance: Normal appearance.  HENT:     Head: Normocephalic and atraumatic.     Nose: Nose normal.     Mouth/Throat:     Mouth: Mucous membranes are dry.  Eyes:     Extraocular Movements: Extraocular movements intact.     Pupils: Pupils are equal, round, and reactive to light.  Cardiovascular:     Rate and Rhythm: Normal rate and regular rhythm.  Pulmonary:     Effort: Pulmonary effort is normal. No respiratory distress.  Abdominal:     General: Abdomen is flat.  There is no distension.  Musculoskeletal:        General: No swelling. Normal range of motion.     Cervical back: Normal range of motion and neck supple.  Skin:    General: Skin is warm and dry.  Neurological:     General: No focal deficit present.     Mental Status: She is alert and oriented to person, place, and time.  Psychiatric:        Mood and Affect: Mood normal.        Behavior: Behavior normal.        Thought Content: Thought content normal.        Judgment: Judgment normal.    Laboratory Data:  No results found for this or any previous visit (from the past 24 hour(s)). No results found for this or any previous visit (from the past 240 hour(s)). Creatinine: Recent Labs    11/03/20 1540  CREATININE 0.76   Baseline Creatinine: 0.76  Impression/Assessment:  [phone] with bladder tumor  Plan:  The risks/benefits/alterantives to bladder tumor resection was explained to the patient and she understands and wishes to proceed surgery.  Nicolette Bang 11/07/2020, 9:21 AM

## 2020-11-07 NOTE — Op Note (Signed)
.  Preoperative diagnosis: bladder tumor  Postoperative diagnosis: Same  Procedure: 1 cystoscopy 2. Transurethral resection of bladder tumor, small  Attending: Rosie Fate  Anesthesia: General  Estimated blood loss: Minimal  Drains: 18 French foley  Specimens: bladder tumor  Antibiotics: ancef  Findings:  1cm papillary left lateral wall tumor at previous resection site.  Ureteral orifices in normal anatomic location.   Indications: Patient is a 87 year old female with a history of bladder tumor found on office cystoscopy.  After discussing treatment options, they decided proceed with transurethral resection of a bladder tumor.  Procedure her in detail: The patient was brought to the operating room and a brief timeout was done to ensure correct patient, correct procedure, correct site.  General anesthesia was administered patient was placed in dorsal lithotomy position.  Their genitalia was then prepped and draped in usual sterile fashion.  A rigid 51 French cystoscope was passed in the urethra and the bladder.  Bladder was inspected and we noted a 1cm bladder tumor.  the ureteral orifices were in the normal orthotopic locations.   We then removed the cystoscope and placed a resectoscope into the bladder.  Using the bipolar resectoscope we removed the bladder tumor down to the base. Hemostasis was then obtained with electrocautery. We then removed the bladder tumor chips and sent them for pathology. We then re-inspected the bladder and found no residula bleeding.  the bladder was then drained, a 22 French foley was placed and this concluded the procedure which was well tolerated by patient.  Complications: None  Condition: Stable, extubated, transferred to PACU  Plan: Patient is  to be discharged home and followup in 5 days for foley catheter removal and pathology discussion.

## 2020-11-07 NOTE — Anesthesia Procedure Notes (Signed)
Procedure Name: Intubation Date/Time: 11/07/2020 9:41 AM Performed by: Lyda Jester, CRNA Pre-anesthesia Checklist: Patient identified, Patient being monitored, Timeout performed, Emergency Drugs available and Suction available Patient Re-evaluated:Patient Re-evaluated prior to induction Oxygen Delivery Method: Circle System Utilized Preoxygenation: Pre-oxygenation with 100% oxygen Induction Type: IV induction Ventilation: Mask ventilation without difficulty Laryngoscope Size: Miller and 2 Grade View: Grade II Tube type: Oral Tube size: 7.0 mm Number of attempts: 1 Airway Equipment and Method: stylet Placement Confirmation: ETT inserted through vocal cords under direct vision, positive ETCO2 and breath sounds checked- equal and bilateral Secured at: 21 cm Tube secured with: Tape Dental Injury: Teeth and Oropharynx as per pre-operative assessment

## 2020-11-07 NOTE — Transfer of Care (Signed)
Immediate Anesthesia Transfer of Care Note  Patient: [name]  Procedure(s) Performed: CYSTOSCOPY (Bladder) TRANSURETHRAL RESECTION OF BLADDER TUMOR (TURBT) (Bladder)  Patient Location: PACU  Anesthesia Type:General  Level of Consciousness: awake, alert  and oriented  Airway & Oxygen Therapy: Patient Spontanous Breathing and Patient connected to nasal cannula oxygen  Post-op Assessment: Report given to RN, Post -op Vital signs reviewed and stable and Patient moving all extremities X 4  Post vital signs: Reviewed and stable  Last Vitals:  Vitals Value Taken Time  BP 136/76 11/07/20 1017  Temp    Pulse 60 11/07/20 1018  Resp 18 11/07/20 1019  SpO2 100 % 11/07/20 1018  Vitals shown include unvalidated device data.  Last Pain:  Vitals:   11/07/20 0832  TempSrc: Oral  PainSc: 0-No pain      Patients Stated Pain Goal: 6 (83/67/25 5001)  Complications: No notable events documented.

## 2020-11-07 NOTE — Anesthesia Postprocedure Evaluation (Signed)
Anesthesia Post Note  Patient: [name]  Procedure(s) Performed: CYSTOSCOPY (Bladder) TRANSURETHRAL RESECTION OF BLADDER TUMOR (TURBT) (Bladder)  Patient location during evaluation: Phase II Anesthesia Type: General Level of consciousness: awake Pain management: pain level controlled Vital Signs Assessment: post-procedure vital signs reviewed and stable Respiratory status: spontaneous breathing and respiratory function stable Cardiovascular status: blood pressure returned to baseline and stable Postop Assessment: no headache and no apparent nausea or vomiting Anesthetic complications: no Comments: Late entry   No notable events documented.   Last Vitals:  Vitals:   11/07/20 1030 11/07/20 1057  BP: 140/74 139/70  Pulse:  (!) 59  Resp: 13 16  Temp:    SpO2: 98% 99%    Last Pain:  Vitals:   11/07/20 1057  TempSrc:   PainSc: 0-No pain                 Louann Sjogren

## 2020-11-08 ENCOUNTER — Encounter (HOSPITAL_COMMUNITY): Payer: Self-pay | Admitting: Urology

## 2020-11-08 ENCOUNTER — Ambulatory Visit (INDEPENDENT_AMBULATORY_CARE_PROVIDER_SITE_OTHER): Payer: Medicare Other | Admitting: Gastroenterology

## 2020-11-08 LAB — SURGICAL PATHOLOGY

## 2020-11-10 ENCOUNTER — Ambulatory Visit (INDEPENDENT_AMBULATORY_CARE_PROVIDER_SITE_OTHER): Payer: Medicare Other

## 2020-11-10 ENCOUNTER — Other Ambulatory Visit: Payer: Self-pay

## 2020-11-10 DIAGNOSIS — C678 Malignant neoplasm of overlapping sites of bladder: Secondary | ICD-10-CM

## 2020-11-10 NOTE — Progress Notes (Signed)
Catheter Removal  Patient is present today for a catheter removal.  5m of water was drained from the balloon. A 16FR foley cath was removed from the bladder no complications were noted . Patient tolerated well.  Performed by: Kaiea Esselman LPN  Follow up/ Additional notes: keep scheduled post op.

## 2020-11-16 ENCOUNTER — Other Ambulatory Visit: Payer: Self-pay

## 2020-11-16 ENCOUNTER — Ambulatory Visit: Payer: Medicare Other | Admitting: Urology

## 2020-11-16 ENCOUNTER — Encounter: Payer: Self-pay | Admitting: Urology

## 2020-11-16 VITALS — BP 128/72 | HR 60 | Temp 98.4°F

## 2020-11-16 DIAGNOSIS — C678 Malignant neoplasm of overlapping sites of bladder: Secondary | ICD-10-CM | POA: Diagnosis not present

## 2020-11-16 LAB — URINALYSIS, ROUTINE W REFLEX MICROSCOPIC
Bilirubin, UA: NEGATIVE
Glucose, UA: NEGATIVE
Ketones, UA: NEGATIVE
Leukocytes,UA: NEGATIVE
Nitrite, UA: NEGATIVE
Protein,UA: NEGATIVE
Specific Gravity, UA: 1.015 (ref 1.005–1.030)
Urobilinogen, Ur: 0.2 mg/dL (ref 0.2–1.0)
pH, UA: 6 (ref 5.0–7.5)

## 2020-11-16 LAB — MICROSCOPIC EXAMINATION
RBC, Urine: >30 /hpf — AB (ref 0–2)
Renal Epithel, UA: NONE SEEN /hpf

## 2020-11-16 NOTE — Progress Notes (Signed)
Urological Symptom Review  Patient is experiencing the following symptoms: Frequent urination Hard to postpone urination Burning/pain with urination Get up at night to urinate Leakage of urine Blood in urine   Review of Systems  Gastrointestinal (upper)  : Negative for upper GI symptoms  Gastrointestinal (lower) : Negative for lower GI symptoms  Constitutional : Fatigue  Skin: Negative for skin symptoms  Eyes: Negative for eye symptoms  Ear/Nose/Throat : Negative for Ear/Nose/Throat symptoms  Hematologic/Lymphatic: Easy bruising  Cardiovascular : Leg swelling  Respiratory : Negative for respiratory symptoms  Endocrine: Negative for endocrine symptoms  Musculoskeletal: Joint pain  Neurological: Negative for neurological symptoms  Psychologic: Negative for psychiatric symptoms

## 2020-11-16 NOTE — Patient Instructions (Signed)
Bladder Cancer Bladder cancer is a condition in which abnormal tissue (a tumor) grows in the bladder. The bladder is the organ that holds urine. Two tubes (ureters) carry the urine from the kidneys to the bladder. The bladder wall is made of layers of tissue. Cancer that spreads through these layers of the bladder wall becomes more difficult to treat. What are the causes? The cause of this condition is not known. What increases the risk? The following factors may make you more likely to develop this condition: Smoking. Working where there are risks (occupational exposures), such as working with Engineer, structural, Brewing technologist, clothing fabric, dyes, chemicals, and paint. Being 85 years of age or older. Being female. Having bladder inflammation that is long-term (chronic). Having a history of cancer, including: A family history of bladder cancer. Personal experience with bladder cancer. Having had certain treatments for cancer before. These include: Medicines to kill cancer cells (chemotherapy). Strong X-ray beams or capsules high in energy to kill cancer cells and shrink tumors (radiation therapy). Having been exposed to arsenic. This is a Financial risk analyst that can poison you. What are the signs or symptoms? Early symptoms of this condition include: Seeing blood in your urine. Feeling pain when urinating. Having infections of your urinary system (urinary tract infections or UTIs) that happen often. Having to urinate sooner or more often than usual. Later symptoms of this condition include: Not being able to urinate. Pain on one side of your lower back. Loss of appetite. Weight loss. Tiredness (fatigue). Swelling in your feet. Bone pain. How is this diagnosed? This condition is diagnosed based on: Your medical history. A physical exam. Lab tests, such as urine tests. Imaging tests. Your symptoms. You may also have other tests or procedures done, such as: A cystoscopy. A narrow tube is inserted  into your bladder through the organ that connects your bladder to the outside of your body (urethra). This is done to view the lining of your bladder for tumors. A biopsy. This procedure involves removing a tissue sample to look at it under a microscope to see if cancer is present. It is important to find out: How deeply into the bladder wall cancer has grown. Whether cancer has spread to any other parts of your body. This may require blood tests or imaging tests, such as a CT scan, MRI, bone scan, or X-rays. How is this treated? Your health care provider may recommend one or more types of treatment based on the stage of your cancer. The most common types of treatment are: Surgery to remove the cancer. Procedures that may be done include: Removing a tumor on the inside wall of the bladder (transurethral resection). Removing the bladder (cystectomy). Radiation therapy. This is often used together with chemotherapy. Chemotherapy. Immunotherapy. This uses medicines to help your immune system destroy cancer cells. Follow these instructions at home: Take over-the-counter and prescription medicines only as told by your health care provider. Eat a healthy diet. Some of your treatments might affect your appetite. Do not use any products that contain nicotine or tobacco, such as cigarettes, e-cigarettes, and chewing tobacco. If you need help quitting, ask your health care provider. Consider joining a support group. This may help you learn to cope with the stress of having bladder cancer. Tell your cancer care team if you develop side effects. Your team may be able to recommend ways to get relief. Keep all follow-up visits as told by your health care provider. This is important. Where to find more information American  Cancer Society: www.cancer.[place] ([place]): www.cancer.gov Contact a health care provider if: You have symptoms of a urinary tract infection. These  include: Fever. Chills. Weakness. Muscle aches. Pain in your abdomen. Urge to urinate that is stronger and happens more often than usual. Burning feeling in the bladder or urethra when you urinate. Get help right away if: There is blood in your urine. You cannot urinate. You have severe pain or other symptoms that do not go away. Summary Bladder cancer is a condition in which tumors grow in the bladder and cause illness. This condition is diagnosed based on your medical history, a physical exam, lab tests, imaging tests, and your symptoms. Your health care provider may recommend one or more types of treatment based on the stage of your cancer. Consider joining a support group. This may help you learn to cope with the stress of having bladder cancer. This information is not intended to replace advice given to you by your health care provider. Make sure you discuss any questions you have with your health care provider. Document Revised: 09/03/2018 Document Reviewed: 09/03/2018 Elsevier Patient Education  [place].

## 2020-11-16 NOTE — Progress Notes (Signed)
11/16/2020 9:59 AM   [name] May 08, 1930 [id number]  Referring provider: Stateline Nation, MD [name],  [place] [address]  Followup bladder tumor resection   HPI: [name] is a 87yo here for followup after bladder tumor resection. Pathology showed inflammation. No significant LUTS. NO gross hematuria. No other complaints today   PMH: Past Medical History:  Diagnosis Date   Arthritis    Atrial fibrillation [facility])    Atrial flutter ([place])    Bladder cancer ([place])    Cardiomyopathy ([place])    Coronary atherosclerosis of native coronary artery    a. BMS RCA May 2014 - Bon[place]arte stent. b. Cath 04/2017 - patent stent, minimal CAD otherwise.   Depression    Dysrhythmia    Essential hypertension    GI bleeding 06/2017   a. melena/small bowel ulcer by capsule endo 06/2017.   Hyperlipemia    Hypertension    Macular degeneration    NICM (nonischemic cardiomyopathy) (HCC)    Persistent atrial fibrillation (HCC)    Presence of permanent cardiac pacemaker    Ulcerative colitis     Surgical History: Past Surgical History:  Procedure Laterality Date   ABDOMINAL HYSTERECTOMY     APPENDECTOMY     Bilateral knee replacements      2007, 2008   BIV PACEMAKER INSERTION CRT-P N/A 03/02/2020   Procedure: BIV PACEMAKER INSERTION CRT-P;  Surgeon: Constance Haw, MD;  Location: [place] [facility];  Service: Cardiovascular;  Laterality: N/A;   BLADDER SURGERY     CARDIOVERSION N/A 02/04/2017   Procedure: CARDIOVERSION;  Surgeon: Arnoldo Lenis, MD;  [facility];  Service: Endoscopy;  Laterality: N/A;   CARDIOVERSION N/A 02/26/2017   Procedure: CARDIOVERSION;  Surgeon: Satira Sark, MD;  Location: [facility];  Service: Cardiovascular;  Laterality: N/A;   CARDIOVERSION N/A 08/07/2017   Procedure: CARDIOVERSION;  Surgeon: Pixie Casino, MD;  Location: [place];  Service: Cardiovascular;  Laterality: N/A;   COLONOSCOPY N/A 06/15/2015   Procedure:  COLONOSCOPY;  Surgeon: Rogene Houston, MD;  [facility];  Service: Endoscopy;  Laterality: N/A;  210   CYSTOSCOPY N/A 11/07/2020   Procedure: CYSTOSCOPY;  Surgeon: Cleon Gustin, MD;  Location: [facility];  Service: Urology;  Laterality: N/A;   CYSTOSCOPY W/ RETROGRADES Bilateral 05/14/2016   Procedure: CYSTOSCOPY WITH BILATERAL RETROGRADE PYELOGRAM;  Surgeon: Raynelle Bring, MD;  [facility]: [facility];  Service: Urology;  Laterality: Bilateral;  GENERAL ANESTHESIA WITH PARALYSIS   ESOPHAGOGASTRODUODENOSCOPY (EGD) WITH PROPOFOL N/A 06/14/2017   Procedure: ESOPHAGOGASTRODUODENOSCOPY (EGD) WITH PROPOFOL;  Surgeon: Rogene Houston, MD;  [facility];  Service: Endoscopy;  Laterality: N/A;   GIVENS CAPSULE STUDY  06/14/2017   Procedure: GIVENS CAPSULE STUDY;  Surgeon: Rogene Houston, MD;  [facility];  Service: Endoscopy;;   LEFT HEART CATHETERIZATION WITH CORONARY ANGIOGRAM N/A 10/30/2013   Procedure: LEFT HEART CATHETERIZATION WITH CORONARY ANGIOGRAM;  Surgeon: Burnell Blanks, MD;  Location: [facility] C[facility] LAB;  Service: Cardiovascular;  Laterality: N/A;   RIGHT/LEFT HEART CATH AND CORONARY ANGIOGRAPHY N/A 05/03/2017   Procedure: RIGHT/LEFT HEART CATH AND CORONARY ANGIOGRAPHY;  Surgeon: Leonie Man, MD;  Location: [place] [facility];  Service: Cardiovascular;  Laterality: N/A;   TEE WITHOUT CARDIOVERSION N/A 02/04/2017   Procedure: TRANSESOPHAGEAL ECHOCARDIOGRAM (TEE) WITH PROPOL;  Surgeon: Arnoldo Lenis, MD;  [facility];  Service: Endoscopy;  Laterality: N/A;   TONSILLECTOMY  TOTAL KNEE ARTHROPLASTY     TRANSURETHRAL RESECTION OF BLADDER TUMOR N/A 05/14/2016   Procedure: TRANSURETHRAL RESECTION OF BLADDER TUMOR (TURBT);  Surgeon: Raynelle Bring, MD;  [facility];  Service: Urology;  Laterality: N/A;  GENERAL ANESTHESIA WITH PARALYSIS   TRANSURETHRAL RESECTION OF BLADDER TUMOR N/A 11/07/2020   Procedure: TRANSURETHRAL RESECTION OF BLADDER  TUMOR (TURBT);  Surgeon: Cleon Gustin, MD;  [facility];  Service: Urology;  Laterality: N/A;   YAG LASER APPLICATION Bilateral 83/0/9407   Procedure: YAG LASER APPLICATION;  Surgeon: Williams Che, MD;  [facility];  Service: Ophthalmology;  Laterality: Bilateral;    Home Medications:  Allergies as of 11/16/2020       Reactions   Chlor-trimeton [chlorpheniramine] Shortness Of Breath   Carvedilol Itching, Rash   Facial rash/itching   Demerol Rash   Penicillins Rash, Other (See Comments)   REACTION: rash, years ago Has patient had a PCN reaction causing immediate rash, facial/tongue/throat swelling, SOB or lightheadedness with hypotension: Yes Has patient had a PCN reaction causing severe rash involving mucus membranes or skin necrosis: No Has patient had a PCN reaction that required hospitalization No Has patient had a PCN reaction occurring within the last 10 years: No If all of the above answers are "NO", then may proceed with Cephalosporin use.        Medication List        Accurate as of November 16, 2020  9:59 AM. If you have any questions, ask your nurse or doctor.          acetaminophen 500 MG tablet Commonly known as: TYLENOL Take 500-1,000 mg by mouth 3 (three) times daily with meals.   atorvastatin 20 MG tablet Commonly known as: LIPITOR TAKE 1 TABLET BY MOUTH AT BEDTIME   balsalazide 750 MG capsule Commonly known as: COLAZAL Take 3 capsules (2,250 mg total) by mouth 3 (three) times daily.   bisoprolol 5 MG tablet Commonly known as: ZEBETA Take 0.5 tablets (2.5 mg total) by mouth daily.   clotrimazole 10 MG troche Commonly known as: MYCELEX Take 10 mg by mouth 5 (five) times daily.   CRANBERRY PO Take 4,200 mg by mouth daily. With vit C   diclofenac Sodium 1 % Gel Commonly known as: VOLTAREN Apply 1 application topically 4 (four) times daily as needed (hand pain).   dofetilide 250 MCG capsule Commonly known as: TIKOSYN TAKE  ONE CAPSULE BY MOUTH TWICE DAILY   Entresto 24-26 MG Generic drug: sacubitril-valsartan TAKE 1 TABLET BY MOUTH TWICE DAILY - start ENTRESTO 48 HOURS AFTER STOPPING LISINOPRIL   furosemide 20 MG tablet Commonly known as: LASIX TAKE 2 TABLETS BY MOUTH TWICE DAILY   ICAPS AREDS 2 PO Take 2 tablets by mouth daily.   nitroGLYCERIN 0.4 MG SL tablet Commonly known as: NITROSTAT Place 0.4 mg under the tongue every 5 (five) minutes as needed for chest pain.   potassium chloride SA 20 MEQ tablet Commonly known as: KLOR-CON TAKE 1 TABLET BY MOUTH THREE TIMES DAILY   sodium chloride 2 % ophthalmic solution Commonly known as: MURO 128 Place 1 drop into both eyes in the morning and at bedtime.   spironolactone 25 MG tablet Commonly known as: ALDACTONE TAKE 1/2 TABLET BY MOUTH EVERY DAY   Theratears 0.25 % Soln Generic drug: Carboxymethylcellulose Sodium Place 1 drop into both eyes in the morning and at bedtime.   traMADol 50 MG tablet Commonly known as: Ultram Take 1 tablet (50 mg total) by  mouth every 6 (six) hours as needed.   Vitamin D 50 MCG (2000 UT) tablet Take 2,000 Units by mouth daily.   Xarelto 20 MG Tabs tablet Generic drug: rivaroxaban TAKE 1 TABLET BY MOUTH DAILY WITH SUPPER        Allergies:  Allergies  Allergen Reactions   Chlor-Trimeton [Chlorpheniramine] Shortness Of Breath   Carvedilol Itching and Rash    Facial rash/itching   Demerol Rash   Penicillins Rash and Other (See Comments)    REACTION: rash, years ago Has patient had a PCN reaction causing immediate rash, facial/tongue/throat swelling, SOB or lightheadedness with hypotension: Yes Has patient had a PCN reaction causing severe rash involving mucus membranes or skin necrosis: No Has patient had a PCN reaction that required hospitalization No Has patient had a PCN reaction occurring within the last 10 years: No If all of the above answers are "NO", then may proceed with Cephalosporin use.      Family History: Family History  Problem Relation Age of Onset   CAD Father    Diabetes Father    Heart attack Father     Social History:  reports that she has never smoked. She has never used smokeless tobacco. She reports that she does not drink alcohol and does not use drugs.  ROS: All other review of systems were reviewed and are negative except what is noted above in HPI  Physical Exam: BP 128/72   Pulse 60   Temp 98.4 F (36.9 C)   Constitutional:  Alert and oriented, No acute distress. HEENT: [place] AT, moist mucus membranes.  Trachea midline, no masses. Cardiovascular: No clubbing, cyanosis, or edema. Respiratory: Normal respiratory effort, no increased work of breathing. GI: Abdomen is soft, nontender, nondistended, no abdominal masses GU: No CVA tenderness.  Lymph: No cervical or inguinal lymphadenopathy. Skin: No rashes, bruises or suspicious lesions. Neurologic: Grossly intact, no focal deficits, moving all 4 extremities. Psychiatric: Normal mood and affect.  Laboratory Data: Lab Results  Component Value Date   WBC 8.4 11/03/2020   HGB 12.4 11/03/2020   HCT 38.2 11/03/2020   MCV 92.9 11/03/2020   PLT 257 11/03/2020    Lab Results  Component Value Date   CREATININE 0.76 11/03/2020    No results found for: PSA  No results found for: TESTOSTERONE  No results found for: HGBA1C  Urinalysis    Component Value Date/Time   COLORURINE STRAW (A) 06/08/2019 1116   APPEARANCEUR Clear 10/24/2020 1339   LABSPEC 1.005 06/08/2019 1116   PHURINE 7.0 06/08/2019 1116   GLUCOSEU Negative 10/24/2020 [place] 06/08/2019 1116   BILIRUBINUR Negative 10/24/2020 [place] 06/08/2019 1116   PROTEINUR Negative 10/24/2020 [place] 06/08/2019 1116   NITRITE Negative 10/24/2020 1339   NITRITE NEGATIVE 06/08/2019 1116   LEUKOCYTESUR Negative 10/24/2020 1339   LEUKOCYTESUR NEGATIVE 06/08/2019 1116    Lab Results   Component Value Date   LABMICR Comment 10/24/2020   WBCUA 6-10 (A) 10/05/2020   LABEPIT >10 (A) 10/05/2020   BACTERIA [place] (A) 10/05/2020    Pertinent Imaging:  Results for orders placed during the hospital encounter of 09/29/05  DG Abd 1 View  Narrative Clinical data:   Nausea, weakness.  PICC line. CHEST - 1 VIEW: Comparison:  09/06/05. Findings:  Mild cardiomegaly.  No pulmonary vascular congestion or active lung process.  PICC line tip is in the superior vena cava, estimated to be approximately 4.3 cm above the superior  vena cava/right atrial junction. IMPRESSION: Mild cardiomegaly.  No acute chest findings. ABDOMEN - 2 VIEW: Findings:  Mild gaseous distention of colon in the hepatic flexure region.  Minimal small bowel distention.  No extraluminal gas. IMPRESSION: Findings compatible with mild nonspecific ileus.  Provider: Barbarann Ehlers  No results found for this or any previous visit.  No results found for this or any previous visit.  No results found for this or any previous visit.  Results for orders placed during the hospital encounter of 10/08/06  US Renal  Narrative Clinical Data: Urinary frequency. RENAL/URINARY TRACT ULTRASOUND: Technique: Complete ultrasound examination of the urinary tract was performed including evaluation of the kidneys, renal collecting systems, and urinary bladder. Comparison: CT scan 12/14/04. Findings: The right kidney measures 12.3 cm in length and the left kidney measures 13.0 cm in length. No mass or hydronephrosis. There may be small stones in the lower pole of each kidney with one on the right measuring approximately 0.8 cm and on the left measuring approximately 0.7 cm. Incidental imaging of the urinary bladder is unremarkable. There is a hypoechoic focus in the region of the right adnexa measuring approximately 5.2 x 3.5 cm. This cannot be definitively characterized.  Impression Possible small bilateral renal stones without  hydronephrosis. Possible right ovarian enlargement. Pelvic ultrasound could be used for further evaluation.  Provider: Mertie Clause  No results found for this or any previous visit.  No results found for this or any previous visit.  No results found for this or any previous visit.   Assessment & Plan:    1. Malignant neoplasm of overlapping sites of bladder ([place]) -RTC 1 year with cystoscopy - Urinalysis, Routine w reflex microscopic   No follow-ups on file.  Nicolette Bang, MD  [facility] [place]

## 2020-11-24 ENCOUNTER — Other Ambulatory Visit: Payer: Self-pay

## 2020-11-24 ENCOUNTER — Other Ambulatory Visit: Payer: Medicare Other

## 2020-11-24 ENCOUNTER — Telehealth: Payer: Self-pay

## 2020-11-24 DIAGNOSIS — C678 Malignant neoplasm of overlapping sites of bladder: Secondary | ICD-10-CM

## 2020-11-24 MED ORDER — NITROFURANTOIN MONOHYD MACRO 100 MG PO CAPS: 100.0000 mg | ORAL_CAPSULE | Freq: Two times a day (BID) | ORAL | 0 refills | Status: DC

## 2020-11-24 NOTE — Telephone Encounter (Signed)
Received call from patient stating that she had painless hematuria this morning.  Per Dr Alyson Ingles patient needs to drop off urine specimen for culture and then start Macrobid bid x 7 days. Patient called and made aware. Rx sent to pharmacy.

## 2020-11-25 DIAGNOSIS — I10 Essential (primary) hypertension: Secondary | ICD-10-CM | POA: Diagnosis not present

## 2020-11-25 DIAGNOSIS — R946 Abnormal results of thyroid function studies: Secondary | ICD-10-CM | POA: Diagnosis not present

## 2020-11-25 DIAGNOSIS — E7800 Pure hypercholesterolemia, unspecified: Secondary | ICD-10-CM | POA: Diagnosis not present

## 2020-11-25 DIAGNOSIS — C678 Malignant neoplasm of overlapping sites of bladder: Secondary | ICD-10-CM | POA: Diagnosis not present

## 2020-11-25 DIAGNOSIS — C679 Malignant neoplasm of bladder, unspecified: Secondary | ICD-10-CM | POA: Diagnosis not present

## 2020-11-25 LAB — MICROSCOPIC EXAMINATION
RBC, Urine: >30 /hpf — AB (ref 0–2)
Renal Epithel, UA: NONE SEEN /hpf

## 2020-11-25 LAB — URINALYSIS, ROUTINE W REFLEX MICROSCOPIC
Bilirubin, UA: NEGATIVE
Glucose, UA: NEGATIVE
Ketones, UA: NEGATIVE
Nitrite, UA: NEGATIVE
Specific Gravity, UA: 1.015 (ref 1.005–1.030)
Urobilinogen, Ur: 0.2 mg/dL (ref 0.2–1.0)
pH, UA: 6 (ref 5.0–7.5)

## 2020-11-27 LAB — URINE CULTURE

## 2020-11-28 ENCOUNTER — Telehealth: Payer: Self-pay

## 2020-11-28 NOTE — Telephone Encounter (Signed)
Patient called stating she voided bright red blood all day on Saturday.   Sunday her urine was darker and she felt as if her the bleeding was trying to stop.  This morning patient states that she has had no bleeding. Patient asked when was she suppose to start the antibiotic. Patient informed she was suppose to start the antibiotic after she dropped her urine specimen off on Thursday.  Patient stated she missed understood and will start antibiotic today. Urine culture sent to you. Please advise.

## 2020-12-05 NOTE — Telephone Encounter (Signed)
Patient states she stop bleeding a week ago. Patient states she will call office if she has any more issues.

## 2020-12-06 ENCOUNTER — Ambulatory Visit (INDEPENDENT_AMBULATORY_CARE_PROVIDER_SITE_OTHER): Payer: Medicare Other

## 2020-12-06 DIAGNOSIS — I428 Other cardiomyopathies: Secondary | ICD-10-CM

## 2020-12-06 LAB — CUP PACEART REMOTE DEVICE CHECK
Battery Remaining Longevity: 73 mo
Battery Remaining Percentage: 87 %
Battery Voltage: 2.99 V
Brady Statistic AP VP Percent: 75 %
Brady Statistic AP VS Percent: 1 %
Brady Statistic AS VP Percent: 23 %
Brady Statistic AS VS Percent: 1 %
Brady Statistic RA Percent Paced: 69 %
Date Time Interrogation Session: 20221129030518
Implantable Lead Implant Date: 20220223
Implantable Lead Implant Date: 20220223
Implantable Lead Implant Date: 20220223
Implantable Lead Location: 753858
Implantable Lead Location: 753859
Implantable Lead Location: 753860
Implantable Pulse Generator Implant Date: 20220223
Lead Channel Impedance Value: 510 Ohm
Lead Channel Impedance Value: 560 Ohm
Lead Channel Impedance Value: 700 Ohm
Lead Channel Pacing Threshold Amplitude: 0.75 V
Lead Channel Pacing Threshold Amplitude: 0.75 V
Lead Channel Pacing Threshold Amplitude: 1.5 V
Lead Channel Pacing Threshold Pulse Width: 0.5 ms
Lead Channel Pacing Threshold Pulse Width: 0.5 ms
Lead Channel Pacing Threshold Pulse Width: 0.8 ms
Lead Channel Sensing Intrinsic Amplitude: 3.7 mV
Lead Channel Sensing Intrinsic Amplitude: 5.2 mV
Lead Channel Setting Pacing Amplitude: 2 V
Lead Channel Setting Pacing Amplitude: 2 V
Lead Channel Setting Pacing Amplitude: 2.5 V
Lead Channel Setting Pacing Pulse Width: 0.5 ms
Lead Channel Setting Pacing Pulse Width: 0.8 ms
Lead Channel Setting Sensing Sensitivity: 2 mV
Pulse Gen Model: 3562
Pulse Gen Serial Number: 3861599

## 2020-12-14 NOTE — Progress Notes (Signed)
Remote pacemaker transmission.   

## 2020-12-19 ENCOUNTER — Encounter (INDEPENDENT_AMBULATORY_CARE_PROVIDER_SITE_OTHER): Payer: Self-pay | Admitting: Gastroenterology

## 2020-12-19 ENCOUNTER — Other Ambulatory Visit: Payer: Self-pay

## 2020-12-19 ENCOUNTER — Ambulatory Visit (INDEPENDENT_AMBULATORY_CARE_PROVIDER_SITE_OTHER): Payer: Medicare Other | Admitting: Gastroenterology

## 2020-12-19 VITALS — BP 122/68 | HR 65 | Temp 98.4°F | Ht 63.0 in | Wt 219.1 lb

## 2020-12-19 DIAGNOSIS — K5100 Ulcerative (chronic) pancolitis without complications: Secondary | ICD-10-CM

## 2020-12-19 DIAGNOSIS — B370 Candidal stomatitis: Secondary | ICD-10-CM | POA: Diagnosis not present

## 2020-12-19 NOTE — Progress Notes (Signed)
[name], M.D. Gastroenterology & Hepatology [facility] [address] [place], [place] [address]  Primary Care Physician: [name] Nation, MD [place] [id number]  I will communicate my assessment and recommendations to the referring MD via EMR.  Problems: Oral thrush - resolved History of panulcerative colitis  History of Present Illness: [name] is a 87 y.o. female with Pmh pan ulcerative colitis, bladder cancer, cardiomyopathy, coronary artery disease status post stent placement, depression, atrial fibrillation, macular degeneration, hypertension, who presents for evaluation of oral thrush.  The patient was last seen on 04/12/2020. At that time, the patient was continued on balsalazide 2.25 g 3 times daily for management of bladder mass.  Patient reports that she received cephalexin after undergoing a cystoscopy on 11/07/2020. She reports that she developed thrush and odynophagia. She was given a prescription of nystatin without any improvement. She then took clotrimazole troches prescribed by her PCP wihtout improvement and she was referred to [place] for Further. She reports that her urologist finally prescribed nystatin at a much higher dose (states 4 times the dose) and she states that the thrush completely resolved afterwards  Patient was not a candidate for fluconazole given the possible interaction with Tikosyn.  She denies any more odynophagia, dysphagia or heartburn.  States that ery occasionally she has episodes of diarrhea. Reports she may have 4 Bms followed by a large bowel movement at the end.  Nevertheless, she states that these episodes of diarrhea are rare and she is very regular.  She usually has 1-2 Bms per day. She states that it only happens when eating onions or eating nuts. Also it may be triggered by stressful situations in her life.   Patient takes balsalazide 2.25 g 3 times  daily for panulcerative colitis  The patient denies having any nausea, vomiting, fever, chills, hematochezia, melena, hematemesis, abdominal distention, abdominal pain, jaundice, pruritus or weight loss.  Last EGD: 06/14/2017, 2 cm hiatal hernia, normal stomach and duodenum.  A capsule endoscopy was deployed. Capsule endoscopy performed on 06/15/2017 which showed small bowel small ulcer with brown eschar but no active bleeding.  There was also small submucosal lesion concerning for a submucosal lipoma. Last Colonoscopy: June 2017 with removal of 5 small tubular adenomas (polyps were removed from cecum between 4 to 7 mm, sigmoid colon and splenic flexure).  UC was in remission.  Past Medical History: Past Medical History:  Diagnosis Date   Arthritis    Atrial fibrillation [facility])    Atrial flutter ([place])    Bladder cancer ([place])    Cardiomyopathy ([place])    Coronary atherosclerosis of native coronary artery    a. BMS RCA May 2014 - [place] stent. b. Cath 04/2017 - patent stent, minimal CAD otherwise.   Depression    Dysrhythmia    Essential hypertension    GI bleeding 06/2017   a. melena/small bowel ulcer by capsule endo 06/2017.   Hyperlipemia    Hypertension    Macular degeneration    NICM (nonischemic cardiomyopathy) ([place])    Persistent atrial fibrillation (HCC)    Presence of permanent cardiac pacemaker    Ulcerative colitis     Past Surgical History: Past Surgical History:  Procedure Laterality Date   ABDOMINAL HYSTERECTOMY     APPENDECTOMY     Bilateral knee replacements      2007, 2008   BIV PACEMAKER INSERTION CRT-P N/A 03/02/2020   Procedure: BIV PACEMAKER INSERTION CRT-P;  Surgeon: Constance Haw, MD;  Location: [facility];  Service: Cardiovascular;  Laterality: N/A;   BLADDER SURGERY     CARDIOVERSION N/A 02/04/2017   Procedure: CARDIOVERSION;  Surgeon: Arnoldo Lenis, MD;  [facility];  Service: Endoscopy;  Laterality: N/A;   CARDIOVERSION N/A  02/26/2017   Procedure: CARDIOVERSION;  Surgeon: Satira Sark, MD;  [facility];  Service: Cardiovascular;  Laterality: N/A;   CARDIOVERSION N/A 08/07/2017   Procedure: CARDIOVERSION;  Surgeon: Pixie Casino, MD;  Location: [place];  Service: Cardiovascular;  Laterality: N/A;   COLONOSCOPY N/A 06/15/2015   Procedure: COLONOSCOPY;  Surgeon: Rogene Houston, MD;  [facility];  Service: Endoscopy;  Laterality: N/A;  210   CYSTOSCOPY N/A 11/07/2020   Procedure: CYSTOSCOPY;  Surgeon: Cleon Gustin, MD;  [facility];  Service: Urology;  Laterality: N/A;   CYSTOSCOPY W/ RETROGRADES Bilateral 05/14/2016   Procedure: CYSTOSCOPY WITH BILATERAL RETROGRADE PYELOGRAM;  Surgeon: Raynelle Bring, MD;  [facility];  Service: Urology;  Laterality: Bilateral;  GENERAL ANESTHESIA WITH PARALYSIS   ESOPHAGOGASTRODUODENOSCOPY (EGD) WITH PROPOFOL N/A 06/14/2017   Procedure: ESOPHAGOGASTRODUODENOSCOPY (EGD) WITH PROPOFOL;  Surgeon: Rogene Houston, MD;  [facility];  Service: Endoscopy;  Laterality: N/A;   GIVENS CAPSULE STUDY  06/14/2017   Procedure: GIVENS CAPSULE STUDY;  Surgeon: Rogene Houston, MD;  [facility];  Service: Endoscopy;;   LEFT HEART CATHETERIZATION WITH CORONARY ANGIOGRAM N/A 10/30/2013   Procedure: LEFT HEART CATHETERIZATION WITH CORONARY ANGIOGRAM;  Surgeon: Burnell Blanks, MD;  Location: [facility] CATH LAB;  Service: Cardiovascular;  Laterality: N/A;   RIGHT/LEFT HEART CATH AND CORONARY ANGIOGRAPHY N/A 05/03/2017   Procedure: RIGHT/LEFT HEART CATH AND CORONARY ANGIOGRAPHY;  Surgeon: Leonie Man, MD;  Location: [place] [facility];  Service: Cardiovascular;  Laterality: N/A;   TEE WITHOUT CARDIOVERSION N/A 02/04/2017   Procedure: TRANSESOPHAGEAL ECHOCARDIOGRAM (TEE) WITH PROPOL;  Surgeon: Arnoldo Lenis, MD;  [facility];  Service: Endoscopy;  Laterality: N/A;   TONSILLECTOMY     TOTAL KNEE ARTHROPLASTY      TRANSURETHRAL RESECTION OF BLADDER TUMOR N/A 05/14/2016   Procedure: TRANSURETHRAL RESECTION OF BLADDER TUMOR (TURBT);  Surgeon: Raynelle Bring, MD;  [facility];  Service: Urology;  Laterality: N/A;  GENERAL ANESTHESIA WITH PARALYSIS   TRANSURETHRAL RESECTION OF BLADDER TUMOR N/A 11/07/2020   Procedure: TRANSURETHRAL RESECTION OF BLADDER TUMOR (TURBT);  Surgeon: Cleon Gustin, MD;  [facility];  Service: Urology;  Laterality: N/A;   YAG LASER APPLICATION Bilateral 93/09/298   Procedure: YAG LASER APPLICATION;  Surgeon: Williams Che, MD;  [facility];  Service: Ophthalmology;  Laterality: Bilateral;    Family History: Family History  Problem Relation Age of Onset   CAD Father    Diabetes Father    Heart attack Father     Social History: Social History   Tobacco Use  Smoking Status Never  Smokeless Tobacco Never   Social History   Substance and Sexual Activity  Alcohol Use No   Alcohol/week: 0.0 standard drinks   Social History   Substance and Sexual Activity  Drug Use No    Allergies: Allergies  Allergen Reactions   Chlor-Trimeton [Chlorpheniramine] Shortness Of Breath   Carvedilol Itching and Rash    Facial rash/itching   Demerol Rash   Penicillins Rash and Other (See Comments)    REACTION: rash, years ago Has patient had a PCN reaction causing  immediate rash, facial/tongue/throat swelling, SOB or lightheadedness with hypotension: Yes Has patient had a PCN reaction causing severe rash involving mucus membranes or skin necrosis: No Has patient had a PCN reaction that required hospitalization No Has patient had a PCN reaction occurring within the last 10 years: No If all of the above answers are "NO", then may proceed with Cephalosporin use.     Medications: Current Outpatient Medications  Medication Sig Dispense Refill   acetaminophen (TYLENOL) 500 MG tablet Take 500-1,000 mg by mouth 3 (three) times daily with meals.     atorvastatin  (LIPITOR) 20 MG tablet TAKE 1 TABLET BY MOUTH AT BEDTIME 90 tablet 3   balsalazide (COLAZAL) 750 MG capsule Take 3 capsules (2,250 mg total) by mouth 3 (three) times daily. 270 capsule 11   bisoprolol (ZEBETA) 5 MG tablet Take 0.5 tablets (2.5 mg total) by mouth daily. 45 tablet 1   Carboxymethylcellulose Sodium (THERATEARS) 0.25 % SOLN Place 1 drop into both eyes in the morning and at bedtime.     CRANBERRY PO Take 4,200 mg by mouth daily. With vit C     DHA-EPA-Flaxseed Oil-Vitamin E (THERA TEARS NUTRITION PO) Take by mouth daily at 6 (six) AM.     diclofenac Sodium (VOLTAREN) 1 % GEL Apply 1 application topically 4 (four) times daily as needed (hand pain).     dofetilide (TIKOSYN) 250 MCG capsule TAKE ONE CAPSULE BY MOUTH TWICE DAILY 60 capsule 6   ENTRESTO 24-26 MG TAKE 1 TABLET BY MOUTH TWICE DAILY - start ENTRESTO 48 HOURS AFTER STOPPING LISINOPRIL 60 tablet 6   furosemide (LASIX) 20 MG tablet TAKE 2 TABLETS BY MOUTH TWICE DAILY 270 tablet 1   methocarbamol (ROBAXIN) 750 MG tablet Take 750 mg by mouth. 2 tablets TID     mirabegron ER (MYRBETRIQ) 25 MG TB24 tablet Take 25 mg by mouth daily.     Multiple Vitamins-Minerals (ICAPS AREDS 2 PO) Take 2 tablets by mouth daily.     nitroGLYCERIN (NITROSTAT) 0.4 MG SL tablet Place 0.4 mg under the tongue every 5 (five) minutes as needed for chest pain.     OVER THE COUNTER MEDICATION Vit D 3 25 mcg (1,000units) one tablet bid.     potassium chloride SA (KLOR-CON) 20 MEQ tablet TAKE 1 TABLET BY MOUTH THREE TIMES DAILY 90 tablet 6   rivaroxaban (XARELTO) 20 MG TABS tablet TAKE 1 TABLET BY MOUTH DAILY WITH SUPPER 90 tablet 3   sodium chloride (MURO 128) 2 % ophthalmic solution Place 1 drop into both eyes in the morning and at bedtime.     spironolactone (ALDACTONE) 25 MG tablet TAKE 1/2 TABLET BY MOUTH EVERY DAY 45 tablet 3   traMADol (ULTRAM) 50 MG tablet Take 1 tablet (50 mg total) by mouth every 6 (six) hours as needed. (Patient not taking: Reported  on 12/19/2020) 15 tablet 0   No current facility-administered medications for this visit.    Review of Systems: GENERAL: negative for malaise, night sweats HEENT: No changes in hearing or vision, no nose bleeds or other nasal problems. NECK: Negative for lumps, goiter, pain and significant neck swelling RESPIRATORY: Negative for cough, wheezing CARDIOVASCULAR: Negative for chest pain, leg swelling, palpitations, orthopnea GI: SEE HPI MUSCULOSKELETAL: Negative for joint pain or swelling, back pain, and muscle pain. SKIN: Negative for lesions, rash PSYCH: Negative for sleep disturbance, mood disorder and recent psychosocial stressors. HEMATOLOGY Negative for prolonged bleeding, bruising easily, and swollen nodes. ENDOCRINE: Negative for cold or heat intolerance,  polyuria, polydipsia and goiter. NEURO: negative for tremor, gait imbalance, syncope and seizures. The remainder of the review of systems is noncontributory.   Physical Exam: BP 122/68 (BP Location: Left Arm, Patient Position: Sitting, Cuff Size: Large)   Pulse 65   Temp 98.4 F (36.9 C) (Oral)   Ht 5' 3"  (1.6 m)   Wt 219 lb 1.6 oz (99.4 kg)   BMI 38.81 kg/m  GENERAL: The patient is AO x3, in no acute distress. HEENT: Head is normocephalic and atraumatic. EOMI are intact. Mouth is well hydrated and without lesions. No thrush. NECK: Supple. No masses LUNGS: Clear to auscultation. No presence of rhonchi/wheezing/rales. Adequate chest expansion HEART: RRR, normal s1 and s2. ABDOMEN: Soft, nontender, no guarding, no peritoneal signs, and nondistended. BS +. No masses. EXTREMITIES: Without any cyanosis, clubbing, rash, lesions or edema. NEUROLOGIC: AOx3, no focal motor deficit. SKIN: no jaundice, no rashes  Imaging/Labs: as above  I personally reviewed and interpreted the available labs, imaging and endoscopic files.  Impression and Plan: DON GIARRUSSO is a 87 y.o. female with Pmh pan ulcerative colitis, bladder  cancer, cardiomyopathy, coronary artery disease status post stent placement, depression, atrial fibrillation, macular degeneration, hypertension, who presents for evaluation of oral thrush.  The patient had an episode of oral thrush after taking a cephalosporin but this has resolved after taking high-dose nystatin.  She currently does not have any thrush and does not require any further treatment for this.  She was reassured about her current clinical situation.  Finally, she has very rare episodes of diarrhea but it seems that clinically it has been well controlled with her current dose of balsalazide.  I will consider further work-up is warranted and she had relatively recent labs (CBC and kidney function).  We will continue the same dose of her mesalamine compounds.  - Continue Balsalazide 2.25 g three times a day - RTC 1 year  All questions were answered.      Harvel Quale, MD Gastroenterology and Hepatology [facility]

## 2020-12-19 NOTE — Patient Instructions (Signed)
Continue Balsalazide three times a day

## 2021-01-04 ENCOUNTER — Other Ambulatory Visit: Payer: Self-pay | Admitting: Cardiology

## 2021-02-05 ENCOUNTER — Other Ambulatory Visit: Payer: Self-pay | Admitting: Cardiology

## 2021-02-09 NOTE — Progress Notes (Signed)
Cardiology Office Note  Date: 02/10/2021   ID: Marlowe, Cinquemani 1930/06/24, MRN [id number]  PCP:  [place] Nation, MD  Cardiologist:  Rozann Lesches, MD Electrophysiologist:  Constance Haw, MD   Chief Complaint  Patient presents with   Cardiac follow-up    History of Present Illness: [name] is an 88 y.o. female last seen in July 2022.  She is here for a routine visit.  Reports good days and bad days in terms of overall energy level, but generally NYHA class II-III dyspnea depending on level of activity.  She has had no definitive angina symptoms, no palpitations or syncope.  Weight has crept up, but it sounds like this is more related to caloric intake than fluid per discussion today.  She has a [employer]. Jude CRT-P in place followed by Dr. Curt Bears.  Last device interrogation demonstrated approximately 6.4% AF burden.  I personally reviewed her medications which are outlined below.  She reports compliance with therapy.  We did discuss getting a follow-up echocardiogram to reevaluate LVEF in comparison to previous study from 2021.  I went over her lab work from October 2022.  She denies any spontaneous bleeding problems on Xarelto.  Past Medical History:  Diagnosis Date   Arthritis    Atrial fibrillation [facility])    Atrial flutter ([place])    Bladder cancer ([place])    Cardiomyopathy ([place])    Coronary atherosclerosis of native coronary artery    a. BMS RCA May 2014 - [place] stent. b. Cath 04/2017 - patent stent, minimal CAD otherwise.   Depression    Essential hypertension    GI bleeding 06/2017   a. melena/small bowel ulcer by capsule endo 06/2017.   Hyperlipemia    Macular degeneration    NICM (nonischemic cardiomyopathy) (H[facility])    Persistent atrial fibrillation (HCC)    Presence of permanent cardiac pacemaker    Ulcerative colitis     Past Surgical History:  Procedure Laterality Date   ABDOMINAL HYSTERECTOMY     APPENDECTOMY     Bilateral knee  replacements      2007, 2008   BIV PACEMAKER INSERTION CRT-P N/A 03/02/2020   Procedure: BIV PACEMAKER INSERTION CRT-P;  Surgeon: Constance Haw, MD;  Location: [place] [facility];  Service: Cardiovascular;  Laterality: N/A;   BLADDER SURGERY     CARDIOVERSION N/A 02/04/2017   Procedure: CARDIOVERSION;  Surgeon: Arnoldo Lenis, MD;  [facility];  Service: Endoscopy;  Laterality: N/A;   CARDIOVERSION N/A 02/26/2017   Procedure: CARDIOVERSION;  Surgeon: Satira Sark, MD;  [facility];  Service: Cardiovascular;  Laterality: N/A;   CARDIOVERSION N/A 08/07/2017   Procedure: CARDIOVERSION;  Surgeon: Pixie Casino, MD;  Location: [place];  Service: Cardiovascular;  Laterality: N/A;   COLONOSCOPY N/A 06/15/2015   Procedure: COLONOSCOPY;  Surgeon: Rogene Houston, MD;  [facility];  Service: Endoscopy;  Laterality: N/A;  210   CYSTOSCOPY N/A 11/07/2020   Procedure: CYSTOSCOPY;  Surgeon: Cleon Gustin, MD;  [facility];  Service: Urology;  Laterality: N/A;   CYSTOSCOPY W/ RETROGRADES Bilateral 05/14/2016   Procedure: CYSTOSCOPY WITH BILATERAL RETROGRADE PYELOGRAM;  Surgeon: Raynelle Bring, MD;  [facility];  Service: Urology;  Laterality: Bilateral;  GENERAL ANESTHESIA WITH PARALYSIS   ESOPHAGOGASTRODUODENOSCOPY (EGD) WITH PROPOFOL N/A 06/14/2017   Procedure: ESOPHAGOGASTRODUODENOSCOPY (EGD) WITH PROPOFOL;  Surgeon: Rogene Houston, MD;  [facility];  Service: Endoscopy;  Laterality: N/A;  GIVENS CAPSULE STUDY  06/14/2017   Procedure: GIVENS CAPSULE STUDY;  Surgeon: Rogene Houston, MD;  [facility];  Service: Endoscopy;;   LEFT HEART CATHETERIZATION WITH CORONARY ANGIOGRAM N/A 10/30/2013   Procedure: LEFT HEART CATHETERIZATION WITH CORONARY ANGIOGRAM;  Surgeon: Burnell Blanks, MD;  Location: [facility] CATH LAB;  Service: Cardiovascular;  Laterality: N/A;   RIGHT/LEFT HEART CATH AND CORONARY ANGIOGRAPHY N/A 05/03/2017    Procedure: RIGHT/LEFT HEART CATH AND CORONARY ANGIOGRAPHY;  Surgeon: Leonie Man, MD;  Location: [place] [facility];  Service: Cardiovascular;  Laterality: N/A;   TEE WITHOUT CARDIOVERSION N/A 02/04/2017   Procedure: TRANSESOPHAGEAL ECHOCARDIOGRAM (TEE) WITH PROPOL;  Surgeon: Arnoldo Lenis, MD;  [facility];  Service: Endoscopy;  Laterality: N/A;   TONSILLECTOMY     TOTAL KNEE ARTHROPLASTY     TRANSURETHRAL RESECTION OF BLADDER TUMOR N/A 05/14/2016   Procedure: TRANSURETHRAL RESECTION OF BLADDER TUMOR (TURBT);  Surgeon: Raynelle Bring, MD;  [facility];  Service: Urology;  Laterality: N/A;  GENERAL ANESTHESIA WITH PARALYSIS   TRANSURETHRAL RESECTION OF BLADDER TUMOR N/A 11/07/2020   Procedure: TRANSURETHRAL RESECTION OF BLADDER TUMOR (TURBT);  Surgeon: Cleon Gustin, MD;  [facility];  Service: Urology;  Laterality: N/A;   YAG LASER APPLICATION Bilateral 01/0/2725   Procedure: YAG LASER APPLICATION;  Surgeon: Williams Che, MD;  [facility];  Service: Ophthalmology;  Laterality: Bilateral;    Current Outpatient Medications  Medication Sig Dispense Refill   acetaminophen (TYLENOL) 500 MG tablet Take 500-1,000 mg by mouth 3 (three) times daily with meals.     atorvastatin (LIPITOR) 20 MG tablet TAKE 1 TABLET BY MOUTH AT BEDTIME 90 tablet 3   balsalazide (COLAZAL) 750 MG capsule Take 3 capsules (2,250 mg total) by mouth 3 (three) times daily. 270 capsule 11   bisoprolol (ZEBETA) 5 MG tablet TAKE 1/2 TABLET BY MOUTH DAILY 45 tablet 1   Carboxymethylcellulose Sodium (THERATEARS) 0.25 % SOLN Place 1 drop into both eyes in the morning and at bedtime.     CRANBERRY PO Take 4,200 mg by mouth daily. With vit C     DHA-EPA-Flaxseed Oil-Vitamin E (THERA TEARS NUTRITION PO) Take by mouth daily at 6 (six) AM.     diclofenac Sodium (VOLTAREN) 1 % GEL Apply 1 application topically 4 (four) times daily as needed (hand pain).     dofetilide (TIKOSYN) 250 MCG capsule  TAKE ONE CAPSULE BY MOUTH TWICE DAILY 60 capsule 6   ENTRESTO 24-26 MG TAKE 1 TABLET BY MOUTH TWICE DAILY - start ENTRESTO 48 HOURS AFTER STOPPING LISINOPRIL 60 tablet 6   furosemide (LASIX) 20 MG tablet TAKE 2 TABLETS BY MOUTH TWICE DAILY 270 tablet 1   methocarbamol (ROBAXIN) 750 MG tablet Take 750 mg by mouth. 2 tablets TID     mirabegron ER (MYRBETRIQ) 25 MG TB24 tablet Take 25 mg by mouth daily.     Multiple Vitamins-Minerals (ICAPS AREDS 2 PO) Take 2 tablets by mouth daily.     nitroGLYCERIN (NITROSTAT) 0.4 MG SL tablet Place 0.4 mg under the tongue every 5 (five) minutes as needed for chest pain.     OVER THE COUNTER MEDICATION Vit D 3 25 mcg (1,000units) one tablet bid.     potassium chloride SA (KLOR-CON M) 20 MEQ tablet Take 1 tablet (20 mEq total) by mouth 3 (three) times daily. 270 tablet 1   rivaroxaban (XARELTO) 20 MG TABS tablet TAKE 1 TABLET BY MOUTH DAILY WITH SUPPER 90  tablet 3   sodium chloride (MURO 128) 2 % ophthalmic solution Place 1 drop into both eyes in the morning and at bedtime.     spironolactone (ALDACTONE) 25 MG tablet TAKE 1/2 TABLET BY MOUTH EVERY DAY 45 tablet 3   traMADol (ULTRAM) 50 MG tablet Take 1 tablet (50 mg total) by mouth every 6 (six) hours as needed. 15 tablet 0   No current facility-administered medications for this visit.   Allergies:  Chlor-trimeton [chlorpheniramine], Carvedilol, Demerol, and Penicillins   ROS: Insomnia.  Physical Exam: VS:  BP 112/62    Pulse 60    Ht 5' 3"  (1.6 m)    Wt 223 lb (101.2 kg)    SpO2 94%    BMI 39.50 kg/m , BMI Body mass index is 39.5 kg/m.  Wt Readings from Last 3 Encounters:  02/10/21 223 lb (101.2 kg)  12/19/20 219 lb 1.6 oz (99.4 kg)  11/03/20 216 lb (98 kg)    General: Patient appears comfortable at rest. HEENT: Conjunctiva and lids normal, wearing a mask. Neck: Supple, no elevated JVP or carotid bruits, no thyromegaly. Lungs: Clear to auscultation, nonlabored breathing at rest. Cardiac: Regular rate  and rhythm, no S3, 1/6 systolic murmur, no pericardial rub. Extremities: Mild ankle edema.  ECG:  An ECG dated 08/01/2020 was personally reviewed today and demonstrated:  Dual-chamber pacing with intermittent ventricular pacing.  Recent Labwork: 11/03/2020: BUN 21; Creatinine, Ser 0.76; Hemoglobin 12.4; Platelets 257; Potassium 4.0; Sodium 136   Other Studies Reviewed Today:  Echocardiogram 03/25/2019:  1. Endocardium poorly visualized, difficult to assess LV function.  Recommend limited contrast study to better evaluate LVEF, 30-35% is rough  estimate. . Left ventricular ejection fraction, by estimation, is 30-35%.  The left ventricle has moderate to  severely decreased function. The left ventricle demonstrates global  hypokinesis. Left ventricular diastolic parameters are consistent with  Grade I diastolic dysfunction (impaired relaxation).   2. Right ventricular systolic function is normal. The right ventricular  size is normal. There is normal pulmonary artery systolic pressure.   3. Left atrial size was severely dilated.   4. Right atrial size was mildly dilated.   5. The mitral valve is normal in structure. No evidence of mitral valve  regurgitation. No evidence of mitral stenosis.   6. The aortic valve is tricuspid. Aortic valve regurgitation is not  visualized. No aortic stenosis is present.   7. The inferior vena cava is normal in size with greater than 50%  respiratory variability, suggesting right atrial pressure of 3 mmHg.  Assessment and Plan:  1.  HFrEF with nonischemic cardiomyopathy, LVEF 30 to 35% by last evaluation in 2021.  She continues on medical therapy and has a St. Jude CRT-P in place.  Currently on bisoprolol, Entresto, Aldactone, Lasix, and potassium supplement.  Follow-up echocardiogram will be obtained for reassessment of LVEF.  2.  Paroxysmal to persistent atrial fibrillation, CHA2DS2-VASc score of 6.  Most recent device interrogation indicated only 6.4%  rhythm burden.  She has tolerated Tikosyn well and remains on Xarelto for stroke prophylaxis without spontaneous bleeding problems.  3.  CAD status post BMS to the RCA in 2014.  No definite angina at this time.  Continue Lipitor.  Medication Adjustments/Labs and Tests Ordered: Current medicines are reviewed at length with the patient today.  Concerns regarding medicines are outlined above.   Tests Ordered: Orders Placed This Encounter  Procedures   ECHOCARDIOGRAM COMPLETE    Medication Changes: No orders of the defined types  were placed in this encounter.   Disposition:  Follow up  6 months.  Signed, Satira Sark, MD, [facility] 02/10/2021 9:01 AM    [facility] [place], [place], [place] [address] Phone: [phone]; Fax: [phone]

## 2021-02-10 ENCOUNTER — Other Ambulatory Visit: Payer: Self-pay

## 2021-02-10 ENCOUNTER — Encounter: Payer: Self-pay | Admitting: Cardiology

## 2021-02-10 ENCOUNTER — Ambulatory Visit: Payer: Medicare Other | Admitting: Cardiology

## 2021-02-10 VITALS — BP 112/62 | HR 60 | Ht 63.0 in | Wt 223.0 lb

## 2021-02-10 DIAGNOSIS — I429 Cardiomyopathy, unspecified: Secondary | ICD-10-CM

## 2021-02-10 DIAGNOSIS — I5020 Unspecified systolic (congestive) heart failure: Secondary | ICD-10-CM

## 2021-02-10 DIAGNOSIS — I25119 Atherosclerotic heart disease of native coronary artery with unspecified angina pectoris: Secondary | ICD-10-CM

## 2021-02-10 DIAGNOSIS — I4819 Other persistent atrial fibrillation: Secondary | ICD-10-CM

## 2021-02-10 DIAGNOSIS — I428 Other cardiomyopathies: Secondary | ICD-10-CM

## 2021-02-10 NOTE — Patient Instructions (Signed)
Testing/Procedures: Your physician has requested that you have an echocardiogram. Echocardiography is a painless test that uses sound waves to create images of your heart. It provides your doctor with information about the size and shape of your heart and how well your hearts chambers and valves are working. This procedure takes approximately one hour. There are no restrictions for this procedure.   Follow-Up: Follow up with Dr. Domenic Polite in 6 months  Any Other Special Instructions Will Be Listed Below (If Applicable).     If you need a refill on your cardiac medications before your next appointment, please call your pharmacy.

## 2021-02-28 DIAGNOSIS — I482 Chronic atrial fibrillation, unspecified: Secondary | ICD-10-CM | POA: Diagnosis not present

## 2021-02-28 DIAGNOSIS — I10 Essential (primary) hypertension: Secondary | ICD-10-CM | POA: Diagnosis not present

## 2021-02-28 DIAGNOSIS — R42 Dizziness and giddiness: Secondary | ICD-10-CM | POA: Diagnosis not present

## 2021-02-28 DIAGNOSIS — I429 Cardiomyopathy, unspecified: Secondary | ICD-10-CM | POA: Diagnosis not present

## 2021-03-07 ENCOUNTER — Ambulatory Visit (INDEPENDENT_AMBULATORY_CARE_PROVIDER_SITE_OTHER): Payer: Medicare Other

## 2021-03-07 DIAGNOSIS — I429 Cardiomyopathy, unspecified: Secondary | ICD-10-CM | POA: Diagnosis not present

## 2021-03-07 LAB — CUP PACEART REMOTE DEVICE CHECK
Battery Remaining Longevity: 71 mo
Battery Remaining Percentage: 83 %
Battery Voltage: 2.99 V
Brady Statistic AP VP Percent: 79 %
Brady Statistic AP VS Percent: 1 %
Brady Statistic AS VP Percent: 20 %
Brady Statistic AS VS Percent: 1 %
Brady Statistic RA Percent Paced: 72 %
Date Time Interrogation Session: 20230228020015
Implantable Lead Implant Date: 20220223
Implantable Lead Implant Date: 20220223
Implantable Lead Implant Date: 20220223
Implantable Lead Location: 753858
Implantable Lead Location: 753859
Implantable Lead Location: 753860
Implantable Pulse Generator Implant Date: 20220223
Lead Channel Impedance Value: 510 Ohm
Lead Channel Impedance Value: 590 Ohm
Lead Channel Impedance Value: 730 Ohm
Lead Channel Pacing Threshold Amplitude: 0.75 V
Lead Channel Pacing Threshold Amplitude: 0.75 V
Lead Channel Pacing Threshold Amplitude: 1.5 V
Lead Channel Pacing Threshold Pulse Width: 0.5 ms
Lead Channel Pacing Threshold Pulse Width: 0.5 ms
Lead Channel Pacing Threshold Pulse Width: 0.8 ms
Lead Channel Sensing Intrinsic Amplitude: 3.5 mV
Lead Channel Sensing Intrinsic Amplitude: 3.7 mV
Lead Channel Setting Pacing Amplitude: 2 V
Lead Channel Setting Pacing Amplitude: 2 V
Lead Channel Setting Pacing Amplitude: 2.5 V
Lead Channel Setting Pacing Pulse Width: 0.5 ms
Lead Channel Setting Pacing Pulse Width: 0.8 ms
Lead Channel Setting Sensing Sensitivity: 2 mV
Pulse Gen Model: 3562
Pulse Gen Serial Number: 3861599

## 2021-03-09 ENCOUNTER — Other Ambulatory Visit: Payer: Self-pay | Admitting: Cardiology

## 2021-03-09 ENCOUNTER — Other Ambulatory Visit (INDEPENDENT_AMBULATORY_CARE_PROVIDER_SITE_OTHER): Payer: Self-pay | Admitting: Internal Medicine

## 2021-03-13 NOTE — Progress Notes (Unsigned)
Cardiology Office Note Date:  03/13/2021  Patient ID:  [name], [name] 1930/06/25, MRN [id number] PCP:  [place], MD  Cardiologist:  Dr. Domenic Polite Electrophysiologist: Dr. Curt Bears  ***refresh   Chief Complaint: ***  42mo History of Present Illness: MNICOLE DEFINOis a 88y.o. female with history of CAD (remote PCI to RCA), NICM, chronic CHF (systolic), Afib  She comes in today to be seen for Dr. CCurt Bears last seen by him May 2022, she had some symptom improvement post CRT implant, still getting fatigued later in the day, overall with more energy.  AFib burden was low, no changes were made.  More recently saw Dr. MDomenic Polite2/3/23, also doing pretty well, described class III-III symptoms with plans to update her echo post CRT  *** symptoms *** volume *** meds, CAD/CM *** Tikosyn EKG, labs, med list *** xarelto, labs, dose *** AF burden *** BP%   Device information Abbott CRT-P implanted 03/02/20   Past Medical History:  Diagnosis Date   Arthritis    Atrial fibrillation ([facility]    [place]    Bladder cancer ([place]    Cardiomyopathy ([place]    Coronary atherosclerosis of native coronary artery    a. BMS RCA May 2014 - AAdastent. b. Cath 04/2017 - patent stent, minimal CAD otherwise.   Depression    Essential hypertension    GI bleeding 06/2017   a. melena/small bowel ulcer by capsule endo 06/2017.   Hyperlipemia    Macular degeneration    NICM (nonischemic cardiomyopathy) (HCC)    Persistent atrial fibrillation (HCC)    Presence of permanent cardiac pacemaker    Ulcerative colitis     Past Surgical History:  Procedure Laterality Date   ABDOMINAL HYSTERECTOMY     APPENDECTOMY     Bilateral knee replacements      2007, 2008   BIV PACEMAKER INSERTION CRT-P N/A 03/02/2020   Procedure: BIV PACEMAKER INSERTION CRT-P;  Surgeon: CConstance Haw MD;  [facility];  Service: Cardiovascular;  Laterality: N/A;   BLADDER  SURGERY     CARDIOVERSION N/A 02/04/2017   Procedure: CARDIOVERSION;  Surgeon: BArnoldo Lenis MD;  [facility];  Service: Endoscopy;  Laterality: N/A;   CARDIOVERSION N/A 02/26/2017   Procedure: CARDIOVERSION;  Surgeon: MSatira Sark MD;  Location: [facility];  Service: Cardiovascular;  Laterality: N/A;   CARDIOVERSION N/A 08/07/2017   Procedure: CARDIOVERSION;  Surgeon: HPixie Casino MD;  [place]  Service: Cardiovascular;  Laterality: N/A;   COLONOSCOPY N/A 06/15/2015   Procedure: COLONOSCOPY;  Surgeon: NRogene Houston MD;  [facility];  Service: Endoscopy;  Laterality: N/A;  210   CYSTOSCOPY N/A 11/07/2020   Procedure: CYSTOSCOPY;  Surgeon: MCleon Gustin MD;  Location: [facility];  Service: Urology;  Laterality: N/A;   CYSTOSCOPY W/ RETROGRADES Bilateral 05/14/2016   Procedure: CYSTOSCOPY WITH BILATERAL RETROGRADE PYELOGRAM;  Surgeon: BRaynelle Bring MD;  [facility];  Service: Urology;  Laterality: Bilateral;  GENERAL ANESTHESIA WITH PARALYSIS   ESOPHAGOGASTRODUODENOSCOPY (EGD) WITH PROPOFOL N/A 06/14/2017   Procedure: ESOPHAGOGASTRODUODENOSCOPY (EGD) WITH PROPOFOL;  Surgeon: RRogene Houston MD;  [facility];  Service: Endoscopy;  Laterality: N/A;   GIVENS CAPSULE STUDY  06/14/2017   Procedure: GIVENS CAPSULE STUDY;  Surgeon: RRogene Houston MD;  [facility];  Service: Endoscopy;;   LEFT HEART CATHETERIZATION WITH CORONARY ANGIOGRAM N/A 10/30/2013   Procedure: LEFT HEART  CATHETERIZATION WITH CORONARY ANGIOGRAM;  Surgeon: Burnell Blanks, MD;  Location: [facility] CATH LAB;  Service: Cardiovascular;  Laterality: N/A;   RIGHT/LEFT HEART CATH AND CORONARY ANGIOGRAPHY N/A 05/03/2017   Procedure: RIGHT/LEFT HEART CATH AND CORONARY ANGIOGRAPHY;  Surgeon: Leonie Man, MD;  Location: [place] [facility];  Service: Cardiovascular;  Laterality: N/A;   TEE WITHOUT CARDIOVERSION N/A 02/04/2017   Procedure: TRANSESOPHAGEAL  ECHOCARDIOGRAM (TEE) WITH PROPOL;  Surgeon: Arnoldo Lenis, MD;  [facility];  Service: Endoscopy;  Laterality: N/A;   TONSILLECTOMY     TOTAL KNEE ARTHROPLASTY     TRANSURETHRAL RESECTION OF BLADDER TUMOR N/A 05/14/2016   Procedure: TRANSURETHRAL RESECTION OF BLADDER TUMOR (TURBT);  Surgeon: Raynelle Bring, MD;  [facility];  Service: Urology;  Laterality: N/A;  GENERAL ANESTHESIA WITH PARALYSIS   TRANSURETHRAL RESECTION OF BLADDER TUMOR N/A 11/07/2020   Procedure: TRANSURETHRAL RESECTION OF BLADDER TUMOR (TURBT);  Surgeon: Cleon Gustin, MD;  [facility]: [facility];  Service: Urology;  Laterality: N/A;   YAG LASER APPLICATION Bilateral 40/09/8117   Procedure: YAG LASER APPLICATION;  Surgeon: Williams Che, MD;  Location: [facility];  Service: Ophthalmology;  Laterality: Bilateral;    Current Outpatient Medications  Medication Sig Dispense Refill   furosemide (LASIX) 20 MG tablet TAKE 2 TABLETS BY MOUTH TWICE DAILY 360 tablet 1   acetaminophen (TYLENOL) 500 MG tablet Take 500-1,000 mg by mouth 3 (three) times daily with meals.     atorvastatin (LIPITOR) 20 MG tablet TAKE 1 TABLET BY MOUTH AT BEDTIME 90 tablet 3   balsalazide (COLAZAL) 750 MG capsule TAKE THREE CAPSULES BY MOUTH THREE TIMES DAILY 270 capsule 11   bisoprolol (ZEBETA) 5 MG tablet TAKE 1/2 TABLET BY MOUTH DAILY 45 tablet 1   Carboxymethylcellulose Sodium (THERATEARS) 0.25 % SOLN Place 1 drop into both eyes in the morning and at bedtime.     CRANBERRY PO Take 4,200 mg by mouth daily. With vit C     DHA-EPA-Flaxseed Oil-Vitamin E (THERA TEARS NUTRITION PO) Take by mouth daily at 6 (six) AM.     diclofenac Sodium (VOLTAREN) 1 % GEL Apply 1 application topically 4 (four) times daily as needed (hand pain).     dofetilide (TIKOSYN) 250 MCG capsule TAKE ONE CAPSULE BY MOUTH TWICE DAILY 60 capsule 6   ENTRESTO 24-26 MG TAKE 1 TABLET BY MOUTH TWICE DAILY - start ENTRESTO 48 HOURS AFTER STOPPING LISINOPRIL 60 tablet 6    methocarbamol (ROBAXIN) 750 MG tablet Take 750 mg by mouth. 2 tablets TID     mirabegron ER (MYRBETRIQ) 25 MG TB24 tablet Take 25 mg by mouth daily.     Multiple Vitamins-Minerals (ICAPS AREDS 2 PO) Take 2 tablets by mouth daily.     nitroGLYCERIN (NITROSTAT) 0.4 MG SL tablet Place 0.4 mg under the tongue every 5 (five) minutes as needed for chest pain.     OVER THE COUNTER MEDICATION Vit D 3 25 mcg (1,000units) one tablet bid.     potassium chloride SA (KLOR-CON M) 20 MEQ tablet Take 1 tablet (20 mEq total) by mouth 3 (three) times daily. 270 tablet 1   rivaroxaban (XARELTO) 20 MG TABS tablet TAKE 1 TABLET BY MOUTH DAILY WITH SUPPER 90 tablet 3   sodium chloride (MURO 128) 2 % ophthalmic solution Place 1 drop into both eyes in the morning and at bedtime.     spironolactone (ALDACTONE) 25 MG tablet TAKE 1/2 TABLET BY MOUTH EVERY DAY 45 tablet 3  traMADol (ULTRAM) 50 MG tablet Take 1 tablet (50 mg total) by mouth every 6 (six) hours as needed. 15 tablet 0   No current facility-administered medications for this visit.    Allergies:   Chlor-trimeton [chlorpheniramine], Carvedilol, Demerol, and Penicillins   Social History:  The patient  reports that she has never smoked. She has never used smokeless tobacco. She reports that she does not drink alcohol and does not use drugs.   Family History:  The patient's family history includes CAD in her father; Diabetes in her father; Heart attack in her father.  ROS:  Please see the history of present illness.    All other systems are reviewed and otherwise negative.   PHYSICAL EXAM:  VS:  There were no vitals taken for this visit. BMI: There is no height or weight on file to calculate BMI. Well nourished, well developed, in no acute distress HEENT: normocephalic, atraumatic Neck: no JVD, carotid bruits or masses Cardiac:  *** RRR; no significant murmurs, no rubs, or gallops Lungs:  *** CTA b/l, no wheezing, rhonchi or rales Abd: soft,  nontender MS: no deformity or *** atrophy Ext: *** no edema Skin: warm and dry, no rash Neuro:  No gross deficits appreciated Psych: euthymic mood, full affect  *** PPM site is stable, no tethering or discomfort   EKG:  not done today  Device interrogation done today and reviewed by myself:  ***   03/25/2019: TTE IMPRESSIONS   1. Endocardium poorly visualized, difficult to assess LV function.  Recommend limited contrast study to better evaluate LVEF, 30-35% is rough  estimate. . Left ventricular ejection fraction, by estimation, is 30-35%.  The left ventricle has moderate to  severely decreased function. The left ventricle demonstrates global  hypokinesis. Left ventricular diastolic parameters are consistent with  Grade I diastolic dysfunction (impaired relaxation).   2. Right ventricular systolic function is normal. The right ventricular  size is normal. There is normal pulmonary artery systolic pressure.   3. Left atrial size was severely dilated.   4. Right atrial size was mildly dilated.   5. The mitral valve is normal in structure. No evidence of mitral valve  regurgitation. No evidence of mitral stenosis.   6. The aortic valve is tricuspid. Aortic valve regurgitation is not  visualized. No aortic stenosis is present.   7. The inferior vena cava is normal in size with greater than 50%  respiratory variability, suggesting right atrial pressure of 3 mmHg.   TTE 11/06/18 Review of the above records today demonstrates:   1. Left ventricular ejection fraction, by visual estimation, is 20 to 25%. The left ventricle has severely decreased function. There is mildly increased left ventricular hypertrophy. There is diffuse hypokinesis.  2. Abnormal septal motion consistent with left bundle branch block.  3. Left ventricular diastolic parameters are consistent with Grade I diastolic dysfunction (impaired relaxation).  4. Global right ventricle has normal systolic function.The right  ventricular size is normal. No increase in right ventricular wall thickness.  5. Left atrial size was normal.  6. Right atrial size was normal.  7. Mild to moderate mitral annular calcification.  8. Moderate aortic valve annular calcification.  9. The mitral valve is grossly normal. Mild mitral valve regurgitation. 10. The tricuspid valve is grossly normal. Tricuspid valve regurgitation is trivial. 11. The aortic valve is tricuspid. Aortic valve regurgitation is not visualized. 12. The pulmonic valve was grossly normal. Pulmonic valve regurgitation is mild. 13. The inferior vena cava is normal in  size with greater than 50% respiratory variability, suggesting right atrial pressure of 3 mmHg. 14. TR signal is inadequate for assessing pulmonary artery systolic pressure.   LHC 05/03/17 Hemodynamic findings consistent with mild secondary pulmonary hypertension. Patient has severe nonischemic cardiomyopathy Moderate to Severely reduced CO/CI LV end diastolic pressure is moderately elevated. Mid RCA stent widely patent There is mild (2+) mitral regurgitation.   Zio 09/16/18  Max 197 bpm 09:12am, 08/24 Min 38 bpm 03:09am, 08/24 Avg 68 bpm Rare PVCs Frequent PACs Atrial fibrillation: None Multiple runs of SVT, longest 11 seconds, fastest rate 197 for 8 beats   Recent Labs: 11/03/2020: BUN 21; Creatinine, Ser 0.76; Hemoglobin 12.4; Platelets 257; Potassium 4.0; Sodium 136  No results found for requested labs within last 8760 hours.   CrCl cannot be calculated (Patient's most recent lab result is older than the maximum 21 days allowed.).   Wt Readings from Last 3 Encounters:  02/10/21 223 lb (101.2 kg)  12/19/20 219 lb 1.6 oz (99.4 kg)  11/03/20 216 lb (98 kg)     Other studies reviewed: Additional studies/records reviewed today include: summarized above  ASSESSMENT AND PLAN:  Persistent AFib CHA2DS2Vasc is 6, on xarelto *** appropriately dosed *** %  burden  ICD ***  NICM Chronic CHF (systolic) ***   Disposition: F/u with ***  Current medicines are reviewed at length with the patient today.  The patient did not have any concerns regarding medicines.  Venetia Night, PA-C 03/13/2021 9:50 AM     [facility] [place] [address] [phone] (office)  [phone] (fax)

## 2021-03-14 ENCOUNTER — Ambulatory Visit (INDEPENDENT_AMBULATORY_CARE_PROVIDER_SITE_OTHER): Payer: Medicare Other

## 2021-03-14 DIAGNOSIS — I428 Other cardiomyopathies: Secondary | ICD-10-CM | POA: Diagnosis not present

## 2021-03-14 DIAGNOSIS — I429 Cardiomyopathy, unspecified: Secondary | ICD-10-CM

## 2021-03-14 NOTE — Progress Notes (Signed)
Remote pacemaker transmission.   

## 2021-03-15 ENCOUNTER — Encounter: Payer: Medicare Other | Admitting: Physician Assistant

## 2021-03-15 LAB — ECHOCARDIOGRAM COMPLETE
AR max vel: 1.76 cm2
AV Area VTI: 1.68 cm2
AV Area mean vel: 1.61 cm2
AV Mean grad: 4 mmHg
AV Peak grad: 6.9 mmHg
Ao pk vel: 1.31 m/s
S' Lateral: 4.58 cm
Single Plane A4C EF: 37.7 %

## 2021-04-11 ENCOUNTER — Encounter (INDEPENDENT_AMBULATORY_CARE_PROVIDER_SITE_OTHER): Payer: Self-pay | Admitting: Internal Medicine

## 2021-04-11 ENCOUNTER — Ambulatory Visit (INDEPENDENT_AMBULATORY_CARE_PROVIDER_SITE_OTHER): Payer: Medicare Other | Admitting: Internal Medicine

## 2021-04-11 VITALS — BP 112/68 | Temp 99.1°F | Ht 63.0 in | Wt 219.9 lb

## 2021-04-11 DIAGNOSIS — K5100 Ulcerative (chronic) pancolitis without complications: Secondary | ICD-10-CM | POA: Diagnosis not present

## 2021-04-11 DIAGNOSIS — K8020 Calculus of gallbladder without cholecystitis without obstruction: Secondary | ICD-10-CM

## 2021-04-11 MED ORDER — METAMUCIL SMOOTH TEXTURE 58.6 % PO POWD: 1.0000 | Freq: Every day | ORAL | 12 refills | Status: AC

## 2021-04-11 NOTE — Progress Notes (Signed)
? ?Cardiology Office Note ?Date:  04/13/2021  ?Patient ID:  [name], [name] [birth year], MRN [id number] ?PCP:  [place], MD  ?Cardiologist:  Dr. Domenic Polite ?Electrophysiologist: Dr. Curt Bears ? ?  ?Chief Complaint:  32mo? ?History of Present Illness: ?[name] BURDELLis a 88y.o. female with history of CAD (remote PCI to RCA), NICM, chronic CHF (systolic), Afib ? ?She comes in today to be seen for Dr. CCurt Bears last seen by him May 2022, she had some symptom improvement post CRT implant, still getting fatigued later in the day, overall with more energy.  AFib burden was low, no changes were made. ? ?More recently saw Dr. MDomenic Polite2/3/23, also doing pretty well, described class III-III symptoms with plans to update her echo post CRT ? ?LVEF up some to 40%, global hypokinesis ? ?TODAY ?She is accompanied by her daughter today ?She reports that generally she doesn't feel like she has much energy. ?Some days are better/worse then others. ?On her good days she is able to do her ADLs, and thinks perhaps may over do it and then is more tired/weak for a few days  afterwards. ?Though does wish she had better stamina overall ?No rest SOB ?+ DOE with heavier household chores, like moving furniture to clean ?Sometimes when cooking she gets weak and has to sit down, she does not feel lightheaded or like fainting, but her legs feel weak. ?No near syncope or syncope. ?No bleeding or signs of bleeding ? ? ?Device information ?A[employer]ott CRT-P implanted 03/02/20 ? ? ?Past Medical History:  ?Diagnosis Date  ? Arthritis   ? [place]rial [place] ([place]   ? [place]rial [place] ([facility]   ? [place]   ? Cardiomyopathy ([facility]   ? Coronary atherosclerosis of native coronary artery   ? a. BMS RCA May 2014 - [place]. b. Cath 04/2017 - patent stent, minimal CAD otherwise.  ? Depression   ? Essential hypertension   ? GI bleeding 06/2017  ? a. melena/small bowel ulcer by capsule endo 06/2017.  ? Hyperlipemia   ? Macular degeneration    ? N[place] ([place]ischemic cardiomyopathy) ([place]   ? Persistent atrial fibrillation ([facility]   ? Presence of permanent cardiac pacemaker   ? Ulcerative colitis   ? ? ?Past Surgical History:  ?Procedure Laterality Date  ? ABDOMINAL HYSTERECTOMY    ? APPENDECTOMY    ? Bilateral knee replacements     ? 2007, 2008  ? BIV PACEMAKER INSERTION CRT-P N/A 03/02/2020  ? Procedure: BIV PACEMAKER INSERTION CRT-P;  Surgeon: CConstance Haw MD;  [facility];  Service: Cardiovascular;  Laterality: N/A;  ? BLADDER SURGERY    ? CARDIOVERSION N/A 02/04/2017  ? Procedure: CARDIOVERSION;  Surgeon: BArnoldo Lenis MD;  [facility];  Service: Endoscopy;  Laterality: N/A;  ? CARDIOVERSION N/A 02/26/2017  ? Procedure: CARDIOVERSION;  Surgeon: MSatira Sark MD;  [facility];  Service: Cardiovascular;  Laterality: N/A;  ? CARDIOVERSION N/A 08/07/2017  ? Procedure: CARDIOVERSION;  Surgeon: HPixie Casino MD;  [facility];  Service: Cardiovascular;  Laterality: N/A;  ? COLONOSCOPY N/A 06/15/2015  ? Procedure: COLONOSCOPY;  Surgeon: NRogene Houston MD;  [facility];  Service: Endoscopy;  Laterality: N/A;  210  ? CYSTOSCOPY N/A 11/07/2020  ? Procedure: CYSTOSCOPY;  Surgeon: MCleon Gustin MD;  [facility];  Service: Urology;  Laterality: N/A;  ? CYSTOSCOPY W/ RETROGRADES Bilateral 05/14/2016  ? Procedure: CYSTOSCOPY WITH BILATERAL  RETROGRADE PYELOGRAM;  Surgeon: Raynelle Bring, MD;  [facility];  Service: Urology;  Laterality: Bilateral;  GENERAL ANESTHESIA WITH PARALYSIS  ? ESOPHAGOGASTRODUODENOSCOPY (EGD) WITH PROPOFOL N/A 06/14/2017  ? Procedure: ESOPHAGOGASTRODUODENOSCOPY (EGD) WITH PROPOFOL;  Surgeon: Rogene Houston, MD;  [facility];  Service: Endoscopy;  Laterality: N/A;  ? GIVENS CAPSULE STUDY  06/14/2017  ? Procedure: GIVENS CAPSULE STUDY;  Surgeon: Rogene Houston, MD;  [facility];  Service: Endoscopy;;  ? LEFT HEART CATHETERIZATION  WITH CORONARY ANGIOGRAM N/A 10/30/2013  ? Procedure: LEFT HEART CATHETERIZATION WITH CORONARY ANGIOGRAM;  Surgeon: Burnell Blanks, MD;  Location: [facility] CATH LAB;  Service: Cardiovascular;  Laterality: N/A;  ? RIGHT/LEFT HEART CATH AND CORONARY ANGIOGRAPHY N/A 05/03/2017  ? Procedure: RIGHT/LEFT HEART CATH AND CORONARY ANGIOGRAPHY;  Surgeon: Leonie Man, MD;  Location: [place] [facility] LAB;  Service: Cardiovascular;  Laterality: N/A;  ? TEE WITHOUT CARDIOVERSION N/A 02/04/2017  ? Procedure: TRANSESOPHAGEAL ECHOCARDIOGRAM (TEE) WITH PROPOL;  Surgeon: Arnoldo Lenis, MD;  [facility];  Service: Endoscopy;  Laterality: N/A;  ? TONSILLECTOMY    ? TOTAL KNEE ARTHROPLASTY    ? TRANSURETHRAL RESECTION OF BLADDER TUMOR N/A 05/14/2016  ? Procedure: TRANSURETHRAL RESECTION OF BLADDER TUMOR (TURBT);  Surgeon: Raynelle Bring, MD;  [facility];  Service: Urology;  Laterality: N/A;  GENERAL ANESTHESIA WITH PARALYSIS  ? TRANSURETHRAL RESECTION OF BLADDER TUMOR N/A 11/07/2020  ? Procedure: TRANSURETHRAL RESECTION OF BLADDER TUMOR (TURBT);  Surgeon: Cleon Gustin, MD;  [facility]: [facility];  Service: Urology;  Laterality: N/A;  ? YAG LASER APPLICATION Bilateral 81/01/9145  ? Procedure: YAG LASER APPLICATION;  Surgeon: Williams Che, MD;  Location: [facility];  Service: Ophthalmology;  Laterality: Bilateral;  ? ? ?Current Outpatient Medications  ?Medication Sig Dispense Refill  ? acetaminophen (TYLENOL) 500 MG tablet Take 500-1,000 mg by mouth 3 (three) times daily with meals.    ? atorvastatin (LIPITOR) 20 MG tablet TAKE 1 TABLET BY MOUTH AT BEDTIME 90 tablet 3  ? balsalazide (COLAZAL) 750 MG capsule TAKE THREE CAPSULES BY MOUTH THREE TIMES DAILY 270 capsule 11  ? bisoprolol (ZEBETA) 5 MG tablet TAKE 1/2 TABLET BY MOUTH DAILY 45 tablet 1  ? Carboxymethylcellulose Sodium (THERATEARS) 0.25 % SOLN Place 1 drop into both eyes in the morning and at bedtime.    ? CRANBERRY PO Take 4,200 mg by mouth daily. With vit  C    ? DHA-EPA-Flaxseed Oil-Vitamin E (THERA TEARS NUTRITION PO) Take by mouth daily at 6 (six) AM.    ? dofetilide (TIKOSYN) 250 MCG capsule TAKE ONE CAPSULE BY MOUTH TWICE DAILY 60 capsule 6  ? ENTRESTO 24-26 MG TAKE 1 TABLET BY MOUTH TWICE DAILY - start ENTRESTO 48 HOURS AFTER STOPPING LISINOPRIL 60 tablet 6  ? furosemide (LASIX) 20 MG tablet TAKE 2 TABLETS BY MOUTH TWICE DAILY 360 tablet 1  ? Multiple Vitamins-Minerals (ICAPS AREDS 2 PO) Take 2 tablets by mouth daily.    ? nitroGLYCERIN (NITROSTAT) 0.4 MG SL tablet Place 0.4 mg under the tongue every 5 (five) minutes as needed for chest pain.    ? OVER THE COUNTER MEDICATION Vit D 3 25 mcg (1,000units) one tablet bid.    ? potassium chloride SA (KLOR-CON M) 20 MEQ tablet Take 1 tablet (20 mEq total) by mouth 3 (three) times daily. 270 tablet 1  ? psyllium (METAMUCIL SMOOTH TEXTURE) 58.6 % powder Take 1 packet by mouth at bedtime. 283 g 12  ?  rivaroxaban (XARELTO) 20 MG TABS tablet TAKE 1 TABLET BY MOUTH DAILY WITH SUPPER 90 tablet 3  ? sodium chloride (MURO 128) 2 % ophthalmic solution Place 1 drop into both eyes in the morning and at bedtime.    ? spironolactone (ALDACTONE) 25 MG tablet TAKE 1/2 TABLET BY MOUTH EVERY DAY 45 tablet 3  ? ?No current facility-administered medications for this visit.  ? ? ?Allergies:   Chlor-trimeton [chlorpheniramine], Carvedilol, Demerol, and Penicillins  ? ?Social History:  The patient  reports that she has never smoked. She has never been exposed to tobacco smoke. She has never used smokeless tobacco. She reports that she does not drink alcohol and does not use drugs.  ? ?Family History:  The patient's family history includes CAD in her father; Diabetes in her father; Heart attack in her father. ? ?ROS:  Please see the history of present illness.    ?All other systems are reviewed and otherwise negative.  ? ?PHYSICAL EXAM:  ?VS:  BP 95/66   Pulse 60   Ht 5' 3"  (1.6 m)   Wt 219 lb (99.3 kg)   BMI 38.79 kg/m?  BMI: Body mass  index is 38.79 kg/m?. ?Well nourished, well developed, in no acute distress ?HEENT: normocephalic, atraumatic ?Neck: no JVD, carotid bruits or masses ?Cardiac:  RRR; no significant murmurs, no rubs, or gall

## 2021-04-11 NOTE — Progress Notes (Signed)
Presenting complaint; ? ?Follow-up for chronic ulcerative colitis. ? ?Database and subjective: ? ?Patient is 88 year old Caucasian female with history of pan ulcerative colitis diagnosed in January 2007 was here for scheduled visit.  She was last seen Dr. Jenetta Downer in December 2022 following an episode of oral candidiasis and odynophagia which resolved with nystatin. ?Patient's last colonoscopy was in June 2017 revealing her to be in remission.  She had 5 small/diminutive polyps and these were tubular adenomas.  Given her age we decided to hold off future colonoscopies unless she has symptoms. ? ?Patient states she is doing fine.  She has 1-2 formed stools daily.  She is having intermittent explosive bowel movement.  She may occasionally skip a day.  She denies abdominal pain cramping melena or rectal bleeding.  Her appetite is fair.  She says she is very picky has to the food selection.  She denies heartburn nausea vomiting or dysphagia. ?She was diagnosed with cholelithiasis when she was admitted to [facility] with sudden onset of right infrascapular pain.  Her lipase was over 3600 and rapidly dropped.  She had normal transaminases.  She has not had any further episodes of infrascapular pain.  She also has not had epigastric or right upper quadrant abdominal pain.  She does have fleeting stabbing pain in her back but she has a history of back problems. ?Patient states she has never taken nitroglycerin although she always carries this medication with her. ?Patient takes Tylenol every day.  On most days she takes 3 tablets which is 1500 mg.  Occasionally she may take as many as 6 tablets a day. ?Patient states she had cystoscopy by Dr. Alyson Ingles in October last year and in 1 lesion removed but was not cancer.  She has a history of bladder cancer and remains in remission. ? ?Current Medications: ?Outpatient Encounter Medications as of 04/11/2021  ?Medication Sig  ? acetaminophen (TYLENOL) 500 MG tablet Take 500-1,000 mg by  mouth 3 (three) times daily with meals.  ? atorvastatin (LIPITOR) 20 MG tablet TAKE 1 TABLET BY MOUTH AT BEDTIME  ? balsalazide (COLAZAL) 750 MG capsule TAKE THREE CAPSULES BY MOUTH THREE TIMES DAILY  ? bisoprolol (ZEBETA) 5 MG tablet TAKE 1/2 TABLET BY MOUTH DAILY  ? Carboxymethylcellulose Sodium (THERATEARS) 0.25 % SOLN Place 1 drop into both eyes in the morning and at bedtime.  ? CRANBERRY PO Take 4,200 mg by mouth daily. With vit C  ? DHA-EPA-Flaxseed Oil-Vitamin E (THERA TEARS NUTRITION PO) Take by mouth daily at 6 (six) AM.  ? dofetilide (TIKOSYN) 250 MCG capsule TAKE ONE CAPSULE BY MOUTH TWICE DAILY  ? ENTRESTO 24-26 MG TAKE 1 TABLET BY MOUTH TWICE DAILY - start ENTRESTO 48 HOURS AFTER STOPPING LISINOPRIL  ? furosemide (LASIX) 20 MG tablet TAKE 2 TABLETS BY MOUTH TWICE DAILY  ? Multiple Vitamins-Minerals (ICAPS AREDS 2 PO) Take 2 tablets by mouth daily.  ? nitroGLYCERIN (NITROSTAT) 0.4 MG SL tablet Place 0.4 mg under the tongue every 5 (five) minutes as needed for chest pain.  ? OVER THE COUNTER MEDICATION Vit D 3 25 mcg (1,000units) one tablet bid.  ? potassium chloride SA (KLOR-CON M) 20 MEQ tablet Take 1 tablet (20 mEq total) by mouth 3 (three) times daily.  ? rivaroxaban (XARELTO) 20 MG TABS tablet TAKE 1 TABLET BY MOUTH DAILY WITH SUPPER  ? sodium chloride (MURO 128) 2 % ophthalmic solution Place 1 drop into both eyes in the morning and at bedtime.  ? spironolactone (ALDACTONE) 25 MG tablet TAKE  1/2 TABLET BY MOUTH EVERY DAY  ? diclofenac Sodium (VOLTAREN) 1 % GEL Apply 1 application topically 4 (four) times daily as needed (hand pain). (Patient not taking: Reported on 04/11/2021)  ? methocarbamol (ROBAXIN) 750 MG tablet Take 750 mg by mouth. 2 tablets TID (Patient not taking: Reported on 04/11/2021)  ? mirabegron ER (MYRBETRIQ) 25 MG TB24 tablet Take 25 mg by mouth daily. (Patient not taking: Reported on 04/11/2021)  ? traMADol (ULTRAM) 50 MG tablet Take 1 tablet (50 mg total) by mouth every 6 (six) hours as  needed. (Patient not taking: Reported on 04/11/2021)  ? ?No facility-administered encounter medications on file as of 04/11/2021.  ? ? ? ?Objective: ?Blood pressure 112/68, temperature 99.1 ?F (37.3 ?C), temperature source Oral, height _0  (1.6 m), weight 219 lb 14.4 oz (99.7 kg). ?Patient is alert and in no acute distress. ?She is using cane for ambulation. ?Conjunctiva is pink. Sclera is nonicteric ?Oropharyngeal mucosa is normal. ?No neck masses or thyromegaly noted. ?Cardiac exam with regular rhythm normal S1 and S2. No murmur or gallop noted. ?Lungs are clear to auscultation. ?Abdomen is full but soft and nontender with organomegaly or masses. ?Nonpitting pretibial edema around both ankles. ? ?Labs/studies Results: ? ? ? ?  Latest Ref Rng & Units 11/03/2020  ?  3:40 PM 02/22/2020  ? 11:08 AM 10/26/2019  ?  4:22 PM  ?CBC  ?WBC 4.0 - 10.5 K/uL 8.4   7.7   8.7    ?Hemoglobin 12.0 - 15.0 g/dL 12.4   14.3   13.7    ?Hematocrit 36.0 - 46.0 % 38.2   41.3   38.8    ?Platelets 150 - 400 K/uL 257   285   290    ?  ? ?  Latest Ref Rng & Units 11/03/2020  ?  3:40 PM 02/22/2020  ? 11:08 AM 10/26/2019  ?  4:22 PM  ?CMP  ?Glucose 70 - 99 mg/dL 104   96   97    ?BUN 8 - 23 mg/dL _1 ?Creatinine 0.44 - 1.00 mg/dL 0.76   0.84   0.96    ?Sodium 135 - 145 mmol/L 136   138   139    ?Potassium 3.5 - 5.1 mmol/L 4.0   4.6   4.8    ?Chloride 98 - 111 mmol/L 104   102   103    ?CO2 22 - 32 mmol/L _2 ?Calcium 8.9 - 10.3 mg/dL 9.2   10.1   10.2    ?  ? ?  Latest Ref Rng & Units 06/11/2019  ? 11:57 AM 06/08/2019  ? 12:05 PM 07/23/2017  ?  1:49 PM  ?Hepatic Function  ?Total Protein 6.1 - 8.1 g/dL 7.0   7.7   6.9    ?Albumin 3.5 - 5.0 g/dL  4.3   3.6    ?AST 10 - 35 U/L _3 ?ALT 6 - 29 U/L _4 ?Alk Phosphatase 38 - 126 U/L  50   56    ?Total Bilirubin 0.2 - 1.2 mg/dL 1.4   1.8   1.6    ?  ?Lab Results  ?Component Value Date  ? CRP 0.5 06/11/2019  ?  ? ? ?Assessment: ? ?#1.  Pan ulcerative colitis.   She was diagnosed in  January 2007.  Last colonoscopy was in June 2017 confirming endoscopic remission.  She did have 2 small tubular adenomas but was decided not to do any more colonoscopies unless she has symptoms.  She is on oral mesalamine and she appears to be in remission. ? ?#2.  Asymptomatic cholelithiasis.  She was found to have cholelithiasis in June 2021 when she was admitted with right infrascapular pain with serum lipase of greater than 3681 but ultrasound and CT were negative for changes of pancreatitis.  Transaminases were normal.  It was concluded that she may have passed a small stone.  She opted observation and she has not had any problems since then.   ? ?Once again patient educated as to the symptoms that she should watch out for.  If she has right shoulder, right scapular epigastric right upper quadrant pain she should contact our office.  Chest pain can also occur due to biliary tract disease but given her history chest pain would merit cardiac evaluation for sleep ? ?Plan: ? ?Medication list updated.  The medication that she is not taking were deleted. ?Continue balsalazide at a dose of 2250 mg by mouth 3 times a day. ?Metamucil 4 g or 1 heaping tablespoonful by mouth daily at bedtime. ?Patient advised to keep Tylenol/acetaminophen dose to 2 g daily in divided dose. ?Patient advised to call office if she has rectal bleeding or upper abdominal pain. ?Office visit in 1 year. ? ? ? ? ? ? ?

## 2021-04-11 NOTE — Patient Instructions (Addendum)
Take Metamucil 4 g by mouth daily at bedtime.  Can stop it after 1 month if it does not help improve defecation/prevent explosive bowel movement ? ?Notify if you have upper abdominal pain or rectal bleeding ? ?

## 2021-04-13 ENCOUNTER — Encounter: Payer: Self-pay | Admitting: Physician Assistant

## 2021-04-13 ENCOUNTER — Ambulatory Visit: Payer: Medicare Other | Admitting: Physician Assistant

## 2021-04-13 VITALS — BP 95/66 | HR 60 | Ht 63.0 in | Wt 219.0 lb

## 2021-04-13 DIAGNOSIS — I428 Other cardiomyopathies: Secondary | ICD-10-CM

## 2021-04-13 DIAGNOSIS — I5021 Acute systolic (congestive) heart failure: Secondary | ICD-10-CM | POA: Diagnosis not present

## 2021-04-13 DIAGNOSIS — I5022 Chronic systolic (congestive) heart failure: Secondary | ICD-10-CM | POA: Diagnosis not present

## 2021-04-13 DIAGNOSIS — Z79899 Other long term (current) drug therapy: Secondary | ICD-10-CM | POA: Diagnosis not present

## 2021-04-13 DIAGNOSIS — I4819 Other persistent atrial fibrillation: Secondary | ICD-10-CM

## 2021-04-13 DIAGNOSIS — Z5181 Encounter for therapeutic drug level monitoring: Secondary | ICD-10-CM

## 2021-04-13 DIAGNOSIS — I4891 Unspecified atrial fibrillation: Secondary | ICD-10-CM | POA: Diagnosis not present

## 2021-04-13 DIAGNOSIS — Z950 Presence of cardiac pacemaker: Secondary | ICD-10-CM

## 2021-04-13 LAB — CUP PACEART INCLINIC DEVICE CHECK
Battery Remaining Longevity: 66 mo
Battery Voltage: 2.98 V
Brady Statistic RA Percent Paced: 74 %
Brady Statistic RV Percent Paced: 97 %
Date Time Interrogation Session: 20230406172250
Implantable Lead Implant Date: 20220223
Implantable Lead Implant Date: 20220223
Implantable Lead Implant Date: 20220223
Implantable Lead Location: 753858
Implantable Lead Location: 753859
Implantable Lead Location: 753860
Implantable Pulse Generator Implant Date: 20220223
Lead Channel Impedance Value: 537.5 Ohm
Lead Channel Impedance Value: 637.5 Ohm
Lead Channel Impedance Value: 687.5 Ohm
Lead Channel Pacing Threshold Amplitude: 0.75 V
Lead Channel Pacing Threshold Amplitude: 0.75 V
Lead Channel Pacing Threshold Amplitude: 1 V
Lead Channel Pacing Threshold Amplitude: 1 V
Lead Channel Pacing Threshold Amplitude: 1.25 V
Lead Channel Pacing Threshold Amplitude: 1.25 V
Lead Channel Pacing Threshold Pulse Width: 0.5 ms
Lead Channel Pacing Threshold Pulse Width: 0.5 ms
Lead Channel Pacing Threshold Pulse Width: 0.5 ms
Lead Channel Pacing Threshold Pulse Width: 0.5 ms
Lead Channel Pacing Threshold Pulse Width: 0.8 ms
Lead Channel Pacing Threshold Pulse Width: 0.8 ms
Lead Channel Sensing Intrinsic Amplitude: 10.1 mV
Lead Channel Sensing Intrinsic Amplitude: 3.5 mV
Lead Channel Setting Pacing Amplitude: 2 V
Lead Channel Setting Pacing Amplitude: 2 V
Lead Channel Setting Pacing Amplitude: 2.5 V
Lead Channel Setting Pacing Pulse Width: 0.5 ms
Lead Channel Setting Pacing Pulse Width: 0.8 ms
Lead Channel Setting Sensing Sensitivity: 2 mV
Pulse Gen Model: 3562
Pulse Gen Serial Number: 3861599

## 2021-04-13 NOTE — Patient Instructions (Addendum)
Medication Instructions:  ? ?Your physician recommends that you continue on your current medications as directed. Please refer to the Current Medication list given to you today. ? ?*If you need a refill on your cardiac medications before your next appointment, please call your pharmacy* ? ? ?Lab Work: BMET  CBC AND MAG TODAY  ? ?If you have labs (blood work) drawn today and your tests are completely normal, you will receive your results only by: ?MyChart Message (if you have MyChart) OR ?A paper copy in the mail ?If you have any lab test that is abnormal or we need to change your treatment, we will call you to review the results. ? ? ?Testing/Procedures: NONE ORDERED  TODAY ? ? ? ? ?Follow-Up: ?At [facility], you and your health needs are our priority.  As part of our continuing mission to provide you with exceptional heart care, we have created designated Provider Care Teams.  These Care Teams include your primary Cardiologist (physician) and Advanced Practice Providers (APPs -  Physician Assistants and Nurse Practitioners) who all work together to provide you with the care you need, when you need it. ? ?We recommend signing up for the patient portal called "MyChart".  Sign up information is provided on this After Visit Summary.  MyChart is used to connect with patients for Virtual Visits (Telemedicine).  Patients are able to view lab/test results, encounter notes, upcoming appointments, etc.  Non-urgent messages can be sent to your provider as well.   ?To learn more about what you can do with MyChart, go to [email or web address].   ? ?Your next appointment:   ?3 month(s) ? ?The format for your next appointment:   ?In Person ? ?Provider:  Tommye Standard PA-C  ?  ? ? ?Other Instructions ? ?

## 2021-04-14 ENCOUNTER — Other Ambulatory Visit: Payer: Self-pay | Admitting: *Deleted

## 2021-04-14 DIAGNOSIS — Z79899 Other long term (current) drug therapy: Secondary | ICD-10-CM

## 2021-04-14 MED ORDER — POTASSIUM CHLORIDE CRYS ER 20 MEQ PO TBCR: 20.0000 meq | EXTENDED_RELEASE_TABLET | Freq: Two times a day (BID) | ORAL | 1 refills | Status: DC

## 2021-04-26 LAB — CBC
Hematocrit: 39 % (ref 34.0–46.6)
Hemoglobin: 13.5 g/dL (ref 11.1–15.9)
MCH: 31 pg (ref 26.6–33.0)
MCHC: 34.6 g/dL (ref 31.5–35.7)
MCV: 90 fL (ref 79–97)
Platelets: 301 10*3/uL (ref 150–450)
RBC: 4.35 x10E6/uL (ref 3.77–5.28)
RDW: 13.4 % (ref 11.7–15.4)
WBC: 8.2 10*3/uL (ref 3.4–10.8)

## 2021-04-26 LAB — BASIC METABOLIC PANEL
BUN/Creatinine Ratio: 19 (ref 12–28)
BUN: 19 mg/dL (ref 8–27)
Calcium: 10 mg/dL (ref 8.7–10.3)
Chloride: 98 mmol/L (ref 96–106)
Creatinine, Ser: 0.99 mg/dL (ref 0.57–1.00)
Glucose: 95 mg/dL (ref 70–99)
Potassium: 5.1 mmol/L (ref 3.5–5.2)
Sodium: 136 mmol/L (ref 134–144)
eGFR: 55 mL/min/{1.73_m2} — ABNORMAL LOW (ref >59)

## 2021-04-26 LAB — MAGNESIUM: Magnesium: 2.3 mg/dL (ref 1.6–2.3)

## 2021-04-28 ENCOUNTER — Telehealth: Payer: Self-pay | Admitting: Cardiology

## 2021-04-28 NOTE — Telephone Encounter (Signed)
Fax the lab order over to them. Fax [phone]. Please advise  ?

## 2021-05-01 ENCOUNTER — Other Ambulatory Visit: Payer: Self-pay | Admitting: Physician Assistant

## 2021-05-01 DIAGNOSIS — Z79899 Other long term (current) drug therapy: Secondary | ICD-10-CM | POA: Diagnosis not present

## 2021-05-02 LAB — BASIC METABOLIC PANEL WITH GFR
BUN: 16 mg/dL (ref 7–25)
CO2: 26 mmol/L (ref 20–32)
Calcium: 9.6 mg/dL (ref 8.6–10.4)
Chloride: 102 mmol/L (ref 98–110)
Creat: 0.86 mg/dL (ref 0.60–0.95)
Glucose, Bld: 95 mg/dL (ref 65–139)
Potassium: 4.7 mmol/L (ref 3.5–5.3)
Sodium: 137 mmol/L (ref 135–146)
eGFR: 65 mL/min/{1.73_m2} (ref >=60)

## 2021-05-16 ENCOUNTER — Other Ambulatory Visit: Payer: Self-pay | Admitting: *Deleted

## 2021-05-16 MED ORDER — POTASSIUM CHLORIDE CRYS ER 20 MEQ PO TBCR: 20.0000 meq | EXTENDED_RELEASE_TABLET | Freq: Two times a day (BID) | ORAL | 1 refills | Status: DC

## 2021-05-30 DIAGNOSIS — I4891 Unspecified atrial fibrillation: Secondary | ICD-10-CM | POA: Diagnosis not present

## 2021-05-30 DIAGNOSIS — I428 Other cardiomyopathies: Secondary | ICD-10-CM | POA: Diagnosis not present

## 2021-05-30 DIAGNOSIS — Z0001 Encounter for general adult medical examination with abnormal findings: Secondary | ICD-10-CM | POA: Diagnosis not present

## 2021-05-30 DIAGNOSIS — I509 Heart failure, unspecified: Secondary | ICD-10-CM | POA: Diagnosis not present

## 2021-05-30 DIAGNOSIS — D6869 Other thrombophilia: Secondary | ICD-10-CM | POA: Diagnosis not present

## 2021-06-05 ENCOUNTER — Other Ambulatory Visit: Payer: Self-pay | Admitting: Cardiology

## 2021-06-06 ENCOUNTER — Ambulatory Visit (INDEPENDENT_AMBULATORY_CARE_PROVIDER_SITE_OTHER): Payer: Medicare Other

## 2021-06-06 DIAGNOSIS — I428 Other cardiomyopathies: Secondary | ICD-10-CM

## 2021-06-06 LAB — CUP PACEART REMOTE DEVICE CHECK
Battery Remaining Longevity: 66 mo
Battery Remaining Percentage: 80 %
Battery Voltage: 2.98 V
Brady Statistic AP VP Percent: 92 %
Brady Statistic AP VS Percent: 1 %
Brady Statistic AS VP Percent: 8.1 %
Brady Statistic AS VS Percent: 1 %
Brady Statistic RA Percent Paced: 86 %
Date Time Interrogation Session: 20230530020009
Implantable Lead Implant Date: 20220223
Implantable Lead Implant Date: 20220223
Implantable Lead Implant Date: 20220223
Implantable Lead Location: 753858
Implantable Lead Location: 753859
Implantable Lead Location: 753860
Implantable Pulse Generator Implant Date: 20220223
Lead Channel Impedance Value: 480 Ohm
Lead Channel Impedance Value: 580 Ohm
Lead Channel Impedance Value: 640 Ohm
Lead Channel Pacing Threshold Amplitude: 0.75 V
Lead Channel Pacing Threshold Amplitude: 1 V
Lead Channel Pacing Threshold Amplitude: 1.25 V
Lead Channel Pacing Threshold Pulse Width: 0.5 ms
Lead Channel Pacing Threshold Pulse Width: 0.5 ms
Lead Channel Pacing Threshold Pulse Width: 0.8 ms
Lead Channel Sensing Intrinsic Amplitude: 10.1 mV
Lead Channel Sensing Intrinsic Amplitude: 3.5 mV
Lead Channel Setting Pacing Amplitude: 2 V
Lead Channel Setting Pacing Amplitude: 2 V
Lead Channel Setting Pacing Amplitude: 2.5 V
Lead Channel Setting Pacing Pulse Width: 0.5 ms
Lead Channel Setting Pacing Pulse Width: 0.8 ms
Lead Channel Setting Sensing Sensitivity: 2 mV
Pulse Gen Model: 3562
Pulse Gen Serial Number: 3861599

## 2021-06-13 DIAGNOSIS — M25512 Pain in left shoulder: Secondary | ICD-10-CM | POA: Diagnosis not present

## 2021-06-13 DIAGNOSIS — M25511 Pain in right shoulder: Secondary | ICD-10-CM | POA: Diagnosis not present

## 2021-06-22 NOTE — Progress Notes (Signed)
Remote pacemaker transmission.   

## 2021-06-29 DIAGNOSIS — I482 Chronic atrial fibrillation, unspecified: Secondary | ICD-10-CM | POA: Diagnosis not present

## 2021-06-29 DIAGNOSIS — M19012 Primary osteoarthritis, left shoulder: Secondary | ICD-10-CM | POA: Diagnosis not present

## 2021-06-29 DIAGNOSIS — M19011 Primary osteoarthritis, right shoulder: Secondary | ICD-10-CM | POA: Diagnosis not present

## 2021-06-29 DIAGNOSIS — K51918 Ulcerative colitis, unspecified with other complication: Secondary | ICD-10-CM | POA: Diagnosis not present

## 2021-07-04 ENCOUNTER — Other Ambulatory Visit: Payer: Self-pay | Admitting: Cardiology

## 2021-07-04 ENCOUNTER — Encounter: Payer: Medicare Other | Admitting: Physician Assistant

## 2021-07-17 ENCOUNTER — Encounter: Payer: Medicare Other | Admitting: Physician Assistant

## 2021-08-01 NOTE — Progress Notes (Unsigned)
Cardiology Office Note Date:  08/01/2021  Patient ID:  [name], [name] 12-Jul-1930, MRN [id number] PCP:  [place] Nation, MD  Cardiologist:  Dr. Domenic Polite Electrophysiologist: Dr. Curt Bears    Chief Complaint:  98mo History of Present Illness: [name] HOLSWORTHis a 88y.o. female with history of CAD (remote PCI to RCA), NICM, chronic CHF (systolic), Afib  She comes in today to be seen for Dr. CCurt Bears last seen by him May 2022, she had some symptom improvement post CRT implant, still getting fatigued later in the day, overall with more energy.  AFib burden was low, no changes were made.  More recently saw Dr. MDomenic Polite2/3/23, also doing pretty well, described class III-III symptoms with plans to update her echo post CRT  LVEF up some to 40%, global hypokinesis  I saw her 04/13/21 She is accompanied by her daughter today She reports that generally she doesn't feel like she has much energy. Some days are better/worse then others. On her good days she is able to do her ADLs, and thinks perhaps may over do it and then is more tired/weak for a few days afterwards. Though does wish she had better stamina overall No rest SOB + DOE with heavier household chores, like moving furniture to clean Sometimes when cooking she gets weak and has to sit down, she does not feel lightheaded or like fainting, but her legs feel weak. No near syncope or syncope. No bleeding or signs of bleeding  QRS was variable, 162-2018m her EF was improved to 40% and she was feeling better. Ultimately I decided not to pursue EKG optimization given clinically doing better She did mention some days not feeling as well, ? If this may be AF Her burden was 6% and QT stable  TODAY She is accompanied by her daughter She wishes she had more stamina. Sates she has great energy in the AMs, but by mid-day is worn out.  She is able to get cleaning, cooking, laundry done in the mornings, her daughter suggests perhaps  does a little too much perhaps.  She has had a couple "spells" Once with fast HRs, broke out in a sweat, lasted a couple minutes The others more of a weak feeling, not lightheaded, but like her legs feel weak, has to rest. No near syncope or syncope. Once a CP that was sharp, central, and brief.  No bleeding   Device information Abbott CRT-P implanted 03/02/20   Past Medical History:  Diagnosis Date   Arthritis    [facility]   Atrial flutter (HCC)    Bladder cancer ([facility]   [facility] ([facility]   Coronary atherosclerosis of native coronary artery    a. BMS RCA May 2014 - [place]. b. Cath 04/2017 - patent stent, minimal CAD otherwise.   Depression    Essential hypertension    GI bleeding 06/2017   a. melena/small bowel ulcer by capsule endo 06/2017.   Hyperlipemia    Macular degeneration    NICM (nonischemic cardiomyopathy) ([facility]   Persistent atrial fibrillation (HCC)    Presence of permanent cardiac pacemaker    Ulcerative colitis     Past Surgical History:  Procedure Laterality Date   ABDOMINAL HYSTERECTOMY     APPENDECTOMY     Bilateral knee replacements      2007, 2008   BIV PACEMAKER INSERTION CRT-P N/A 03/02/2020   Procedure: BIV PACEMAKER INSERTION CRT-P;  Surgeon: CaConstance HawMD;  Location:  [facility];  Service: Cardiovascular;  Laterality: N/A;   BLADDER SURGERY     CARDIOVERSION N/A 02/04/2017   Procedure: CARDIOVERSION;  Surgeon: Arnoldo Lenis, MD;  [facility];  Service: Endoscopy;  Laterality: N/A;   CARDIOVERSION N/A 02/26/2017   Procedure: CARDIOVERSION;  Surgeon: Satira Sark, MD;  Location: [facility];  Service: Cardiovascular;  Laterality: N/A;   CARDIOVERSION N/A 08/07/2017   Procedure: CARDIOVERSION;  Surgeon: Pixie Casino, MD;  Location: [facility];  Service: Cardiovascular;  Laterality: N/A;   COLONOSCOPY N/A 06/15/2015   Procedure: COLONOSCOPY;  Surgeon: Rogene Houston, MD;   [facility];  Service: Endoscopy;  Laterality: N/A;  210   CYSTOSCOPY N/A 11/07/2020   Procedure: CYSTOSCOPY;  Surgeon: Cleon Gustin, MD;  Location: [facility];  Service: Urology;  Laterality: N/A;   CYSTOSCOPY W/ RETROGRADES Bilateral 05/14/2016   Procedure: CYSTOSCOPY WITH BILATERAL RETROGRADE PYELOGRAM;  Surgeon: Raynelle Bring, MD;  Location: [facility];  Service: Urology;  Laterality: Bilateral;  GENERAL ANESTHESIA WITH PARALYSIS   ESOPHAGOGASTRODUODENOSCOPY (EGD) WITH PROPOFOL N/A 06/14/2017   Procedure: ESOPHAGOGASTRODUODENOSCOPY (EGD) WITH PROPOFOL;  Surgeon: Rogene Houston, MD;  [facility];  Service: Endoscopy;  Laterality: N/A;   GIVENS CAPSULE STUDY  06/14/2017   Procedure: GIVENS CAPSULE STUDY;  Surgeon: Rogene Houston, MD;  [facility];  Service: Endoscopy;;   LEFT HEART CATHETERIZATION WITH CORONARY ANGIOGRAM N/A 10/30/2013   Procedure: LEFT HEART CATHETERIZATION WITH CORONARY ANGIOGRAM;  Surgeon: Burnell Blanks, MD;  [facility];  Service: Cardiovascular;  Laterality: N/A;   RIGHT/LEFT HEART CATH AND CORONARY ANGIOGRAPHY N/A 05/03/2017   Procedure: RIGHT/LEFT HEART CATH AND CORONARY ANGIOGRAPHY;  Surgeon: Leonie Man, MD;  Location: [facility] CV LAB;  Service: Cardiovascular;  Laterality: N/A;   TEE WITHOUT CARDIOVERSION N/A 02/04/2017   Procedure: TRANSESOPHAGEAL ECHOCARDIOGRAM (TEE) WITH PROPOL;  Surgeon: Arnoldo Lenis, MD;  [facility];  Service: Endoscopy;  Laterality: N/A;   TONSILLECTOMY     TOTAL KNEE ARTHROPLASTY     TRANSURETHRAL RESECTION OF BLADDER TUMOR N/A 05/14/2016   Procedure: TRANSURETHRAL RESECTION OF BLADDER TUMOR (TURBT);  Surgeon: Raynelle Bring, MD;  [facility];  Service: Urology;  Laterality: N/A;  GENERAL ANESTHESIA WITH PARALYSIS   TRANSURETHRAL RESECTION OF BLADDER TUMOR N/A 11/07/2020   Procedure: TRANSURETHRAL RESECTION OF BLADDER TUMOR (TURBT);  Surgeon: Cleon Gustin,  MD;  [facility];  Service: Urology;  Laterality: N/A;   YAG LASER APPLICATION Bilateral 09/10/2353   Procedure: YAG LASER APPLICATION;  Surgeon: Williams Che, MD;  Location[facility];  Service: Ophthalmology;  Laterality: Bilateral;    Current Outpatient Medications  Medication Sig Dispense Refill   acetaminophen (TYLENOL) 500 MG tablet Take 500-1,000 mg by mouth 3 (three) times daily with meals.     atorvastatin (LIPITOR) 20 MG tablet TAKE 1 TABLET BY MOUTH AT BEDTIME 90 tablet 3   balsalazide (COLAZAL) 750 MG capsule TAKE THREE CAPSULES BY MOUTH THREE TIMES DAILY 270 capsule 11   bisoprolol (ZEBETA) 5 MG tablet TAKE 1/2 TABLET BY MOUTH DAILY 45 tablet 1   Carboxymethylcellulose Sodium (THERATEARS) 0.25 % SOLN Place 1 drop into both eyes in the morning and at bedtime.     CRANBERRY PO Take 4,200 mg by mouth daily. With vit C     DHA-EPA-Flaxseed Oil-Vitamin E (THERA TEARS NUTRITION PO) Take by mouth daily at 6 (six) AM.     dofetilide (  TIKOSYN) 250 MCG capsule TAKE ONE CAPSULE BY MOUTH TWICE DAILY 60 capsule 6   ENTRESTO 24-26 MG TAKE 1 TABLET BY MOUTH TWICE DAILY - start ENTRESTO 48 HOURS AFTER STOPPING LISINOPRIL 60 tablet 6   furosemide (LASIX) 20 MG tablet TAKE 2 TABLETS BY MOUTH TWICE DAILY 360 tablet 1   Multiple Vitamins-Minerals (ICAPS AREDS 2 PO) Take 2 tablets by mouth daily.     nitroGLYCERIN (NITROSTAT) 0.4 MG SL tablet Place 0.4 mg under the tongue every 5 (five) minutes as needed for chest pain.     OVER THE COUNTER MEDICATION Vit D 3 25 mcg (1,000units) one tablet bid.     potassium chloride SA (KLOR-CON M) 20 MEQ tablet Take 1 tablet (20 mEq total) by mouth 2 (two) times daily. 180 tablet 1   psyllium (METAMUCIL SMOOTH TEXTURE) 58.6 % powder Take 1 packet by mouth at bedtime. 283 g 12   rivaroxaban (XARELTO) 20 MG TABS tablet TAKE 1 TABLET BY MOUTH DAILY WITH SUPPER 90 tablet 3   sodium chloride (MURO 128) 2 % ophthalmic solution Place 1 drop into both eyes in the  morning and at bedtime.     spironolactone (ALDACTONE) 25 MG tablet TAKE 1/2 TABLET BY MOUTH EVERY DAY 45 tablet 3   No current facility-administered medications for this visit.    Allergies:   Chlor-trimeton [chlorpheniramine], Carvedilol, Demerol, and Penicillins   Social History:  The patient  reports that she has never smoked. She has never been exposed to tobacco smoke. She has never used smokeless tobacco. She reports that she does not drink alcohol and does not use drugs.   Family History:  The patient's family history includes CAD in her father; Diabetes in her father; Heart attack in her father.  ROS:  Please see the history of present illness.    All other systems are reviewed and otherwise negative.   PHYSICAL EXAM:  VS:  There were no vitals taken for this visit. BMI: There is no height or weight on file to calculate BMI. Well nourished, well developed, in no acute distress HEENT: normocephalic, atraumatic Neck: no JVD, carotid bruits or masses Cardiac:  RRR; no significant murmurs, no rubs, or gallops Lungs:  CTA b/l, no wheezing, rhonchi or rales Abd: soft, nontender MS: no deformity or atrophy Ext: no edema Skin: warm and dry, no rash Neuro:  No gross deficits appreciated Psych: euthymic mood, full affect  PPM site is stable, no tethering or discomfort   EKG: done today and reviewed by myself AV paced, paced QRS is a RBBB 137m, QTc accounting for her QTc is OK, PACS  Device interrogation done today and reviewed by myself:  Battery and lead measurements are good She has had some AFib  An episode that lasted a week, otherwise fairly brief and largely appear rate controlled by her histograms 10% CorVue at threshold, a little under  03/14/2021: TTE IMPRESSIONS   1. Left ventricular ejection fraction, by estimation, is 40%. The left  ventricle has low normal function. The left ventricle demonstrates global  hypokinesis. There is mild left ventricular  hypertrophy. Left ventricular  diastolic parameters are  indeterminate.   2. RV not well visualized. Grossly appears normal in size and function. .  Right ventricular systolic function was not well visualized. The right  ventricular size is not well visualized.   3. The mitral valve was not well visualized. No evidence of mitral valve  regurgitation. No evidence of mitral stenosis.   4. The aortic  valve is tricuspid. Aortic valve regurgitation is not  visualized. No aortic stenosis is present.   5. The inferior vena cava is normal in size with greater than 50%  respiratory variability, suggesting right atrial pressure of 3 mmHg.   Comparison(s): LVEF 30-35%.    03/25/2019: TTE IMPRESSIONS   1. Endocardium poorly visualized, difficult to assess LV function.  Recommend limited contrast study to better evaluate LVEF, 30-35% is rough  estimate. . Left ventricular ejection fraction, by estimation, is 30-35%.  The left ventricle has moderate to  severely decreased function. The left ventricle demonstrates global  hypokinesis. Left ventricular diastolic parameters are consistent with  Grade I diastolic dysfunction (impaired relaxation).   2. Right ventricular systolic function is normal. The right ventricular  size is normal. There is normal pulmonary artery systolic pressure.   3. Left atrial size was severely dilated.   4. Right atrial size was mildly dilated.   5. The mitral valve is normal in structure. No evidence of mitral valve  regurgitation. No evidence of mitral stenosis.   6. The aortic valve is tricuspid. Aortic valve regurgitation is not  visualized. No aortic stenosis is present.   7. The inferior vena cava is normal in size with greater than 50%  respiratory variability, suggesting right atrial pressure of 3 mmHg.   TTE 11/06/18 Review of the above records today demonstrates:   1. Left ventricular ejection fraction, by visual estimation, is 20 to 25%. The left ventricle  has severely decreased function. There is mildly increased left ventricular hypertrophy. There is diffuse hypokinesis.  2. Abnormal septal motion consistent with left bundle branch block.  3. Left ventricular diastolic parameters are consistent with Grade I diastolic dysfunction (impaired relaxation).  4. Global right ventricle has normal systolic function.The right ventricular size is normal. No increase in right ventricular wall thickness.  5. Left atrial size was normal.  6. Right atrial size was normal.  7. Mild to moderate mitral annular calcification.  8. Moderate aortic valve annular calcification.  9. The mitral valve is grossly normal. Mild mitral valve regurgitation. 10. The tricuspid valve is grossly normal. Tricuspid valve regurgitation is trivial. 11. The aortic valve is tricuspid. Aortic valve regurgitation is not visualized. 12. The pulmonic valve was grossly normal. Pulmonic valve regurgitation is mild. 13. The inferior vena cava is normal in size with greater than 50% respiratory variability, suggesting right atrial pressure of 3 mmHg. 14. TR signal is inadequate for assessing pulmonary artery systolic pressure.   LHC 05/03/17 Hemodynamic findings consistent with mild secondary pulmonary hypertension. Patient has severe nonischemic cardiomyopathy Moderate to Severely reduced CO/CI LV end diastolic pressure is moderately elevated. Mid RCA stent widely patent There is mild (2+) mitral regurgitation.   Zio 09/16/18  Max 197 bpm 09:12am, 08/24 Min 38 bpm 03:09am, 08/24 Avg 68 bpm Rare PVCs Frequent PACs Atrial fibrillation: None Multiple runs of SVT, longest 11 seconds, fastest rate 197 for 8 beats   Recent Labs: 04/13/2021: Hemoglobin 13.5; Magnesium 2.3; Platelets 301 05/01/2021: BUN 16; Creat 0.86; Potassium 4.7; Sodium 137  No results found for requested labs within last 365 days.   CrCl cannot be calculated (Patient's most recent lab result is older than the maximum  21 days allowed.).   Wt Readings from Last 3 Encounters:  04/13/21 219 lb (99.3 kg)  04/11/21 219 lb 14.4 oz (99.7 kg)  02/10/21 223 lb (101.2 kg)     Other studies reviewed: Additional studies/records reviewed today include: summarized above  ASSESSMENT AND  PLAN:  Persistent AFib CHA2DS2Vasc is 6, on xarelto appropriately dosed 10 % burden I think this is acceptable, QTc is stable Meds reviewed, no contraindicated meds w/Tikosyn noted  Labs today  CRT-P Intact function With industry we did look at optimization options Tried quickOpt recommendations without improvement in her EKG Tried other programming vectors as well, though made no impact on her EKG That being said, the patient requested that we make no changes anyway, even if we did think we got an improved EKG She has gotten clinical improvement and did not want to risk making any changes.  NICM Chronic CHF (systolic) On BB, entresto, lasix, aldactone No symptoms or exam findings of folume CorVue a little under threshold.  5. Symptoms Did not line up with her Afib episodes, she noted more frequent symptoms then she was having AF CP sounds atypical Asked that she check her BP when she is feeling weak. No changes for now   Disposition: remotes as usual, in clinic 4 mo, sooner if needed   Current medicines are reviewed at length with the patient today.  The patient did not have any concerns regarding medicines.  Venetia Night, PA-C 08/01/2021 6:12 PM     [address] [place] [address] [phone] (office)  [phone] (fax)

## 2021-08-02 ENCOUNTER — Encounter: Payer: Self-pay | Admitting: Physician Assistant

## 2021-08-02 ENCOUNTER — Ambulatory Visit (INDEPENDENT_AMBULATORY_CARE_PROVIDER_SITE_OTHER): Payer: Medicare Other | Admitting: Physician Assistant

## 2021-08-02 VITALS — BP 128/64 | HR 64 | Ht 63.0 in | Wt 218.6 lb

## 2021-08-02 DIAGNOSIS — R0789 Other chest pain: Secondary | ICD-10-CM

## 2021-08-02 DIAGNOSIS — I4819 Other persistent atrial fibrillation: Secondary | ICD-10-CM | POA: Diagnosis not present

## 2021-08-02 DIAGNOSIS — Z950 Presence of cardiac pacemaker: Secondary | ICD-10-CM

## 2021-08-02 DIAGNOSIS — I428 Other cardiomyopathies: Secondary | ICD-10-CM

## 2021-08-02 DIAGNOSIS — I5022 Chronic systolic (congestive) heart failure: Secondary | ICD-10-CM | POA: Diagnosis not present

## 2021-08-02 DIAGNOSIS — I5042 Chronic combined systolic (congestive) and diastolic (congestive) heart failure: Secondary | ICD-10-CM | POA: Diagnosis not present

## 2021-08-02 LAB — CUP PACEART INCLINIC DEVICE CHECK
Battery Remaining Longevity: 61 mo
Battery Voltage: 2.98 V
Brady Statistic RA Percent Paced: 78 %
Brady Statistic RV Percent Paced: 97 %
Date Time Interrogation Session: 20230726184101
Implantable Lead Implant Date: 20220223
Implantable Lead Implant Date: 20220223
Implantable Lead Implant Date: 20220223
Implantable Lead Location: 753858
Implantable Lead Location: 753859
Implantable Lead Location: 753860
Implantable Pulse Generator Implant Date: 20220223
Lead Channel Impedance Value: 537.5 Ohm
Lead Channel Impedance Value: 600 Ohm
Lead Channel Impedance Value: 662.5 Ohm
Lead Channel Pacing Threshold Amplitude: 0.5 V
Lead Channel Pacing Threshold Amplitude: 0.5 V
Lead Channel Pacing Threshold Amplitude: 1 V
Lead Channel Pacing Threshold Amplitude: 1 V
Lead Channel Pacing Threshold Amplitude: 1 V
Lead Channel Pacing Threshold Amplitude: 1 V
Lead Channel Pacing Threshold Pulse Width: 0.5 ms
Lead Channel Pacing Threshold Pulse Width: 0.5 ms
Lead Channel Pacing Threshold Pulse Width: 0.5 ms
Lead Channel Pacing Threshold Pulse Width: 0.5 ms
Lead Channel Pacing Threshold Pulse Width: 0.8 ms
Lead Channel Pacing Threshold Pulse Width: 0.8 ms
Lead Channel Sensing Intrinsic Amplitude: 12 mV
Lead Channel Sensing Intrinsic Amplitude: 4.8 mV
Lead Channel Setting Pacing Amplitude: 2 V
Lead Channel Setting Pacing Amplitude: 2 V
Lead Channel Setting Pacing Amplitude: 2.5 V
Lead Channel Setting Pacing Pulse Width: 0.5 ms
Lead Channel Setting Pacing Pulse Width: 0.8 ms
Lead Channel Setting Sensing Sensitivity: 2 mV
Pulse Gen Model: 3562
Pulse Gen Serial Number: 3861599

## 2021-08-02 NOTE — Patient Instructions (Addendum)
Medication Instructions:  Your physician recommends that you continue on your current medications as directed. Please refer to the Current Medication list given to you today.  Labwork: You will have blood work drawn today:  BMET, Magnesium, and CBC.  Testing/Procedures: None ordered.  Follow-Up: Your physician wants you to follow-up in: 4 MONTHS with Tommye Standard, PA-C or Dr. Elliot Cousin.    Remote monitoring is used to monitor your Pacemaker from home. This monitoring reduces the number of office visits required to check your device to one time per year. It allows [place] to keep an eye on the functioning of your device to ensure it is working properly. You are scheduled for a device check from home on 09/05/21. You may send your transmission at any time that day. If you have a wireless device, the transmission will be sent automatically. After your physician reviews your transmission, you will receive a postcard with your next transmission date.  Any Other Special Instructions Will Be Listed Below (If Applicable).  If you need a refill on your cardiac medications before your next appointment, please call your pharmacy.   Important Information About Sugar

## 2021-08-03 LAB — CBC WITH DIFFERENTIAL/PLATELET
Basophils Absolute: 0.1 10*3/uL (ref 0.0–0.2)
Basos: 1 %
EOS (ABSOLUTE): 0.1 10*3/uL (ref 0.0–0.4)
Eos: 2 %
Hematocrit: 39.9 % (ref 34.0–46.6)
Hemoglobin: 13.5 g/dL (ref 11.1–15.9)
Immature Grans (Abs): 0.1 10*3/uL (ref 0.0–0.1)
Immature Granulocytes: 1 %
Lymphocytes Absolute: 1.4 10*3/uL (ref 0.7–3.1)
Lymphs: 16 %
MCH: 30.3 pg (ref 26.6–33.0)
MCHC: 33.8 g/dL (ref 31.5–35.7)
MCV: 90 fL (ref 79–97)
Monocytes Absolute: 0.9 10*3/uL (ref 0.1–0.9)
Monocytes: 11 %
Neutrophils Absolute: 6 10*3/uL (ref 1.4–7.0)
Neutrophils: 69 %
Platelets: 329 10*3/uL (ref 150–450)
RBC: 4.46 x10E6/uL (ref 3.77–5.28)
RDW: 14.3 % (ref 11.7–15.4)
WBC: 8.6 10*3/uL (ref 3.4–10.8)

## 2021-08-03 LAB — BASIC METABOLIC PANEL
BUN/Creatinine Ratio: 21 (ref 12–28)
BUN: 20 mg/dL (ref 8–27)
CO2: 20 mmol/L (ref 20–29)
Calcium: 9.6 mg/dL (ref 8.7–10.3)
Chloride: 98 mmol/L (ref 96–106)
Creatinine, Ser: 0.94 mg/dL (ref 0.57–1.00)
Glucose: 92 mg/dL (ref 70–99)
Potassium: 4.8 mmol/L (ref 3.5–5.2)
Sodium: 133 mmol/L — ABNORMAL LOW (ref 134–144)
eGFR: 58 mL/min/{1.73_m2} — ABNORMAL LOW (ref >59)

## 2021-08-03 LAB — MAGNESIUM: Magnesium: 2.4 mg/dL — ABNORMAL HIGH (ref 1.6–2.3)

## 2021-08-16 ENCOUNTER — Ambulatory Visit: Payer: Medicare Other | Admitting: Cardiology

## 2021-08-17 ENCOUNTER — Ambulatory Visit: Payer: Medicare Other | Admitting: Cardiology

## 2021-08-31 ENCOUNTER — Other Ambulatory Visit: Payer: Self-pay | Admitting: Cardiology

## 2021-09-05 ENCOUNTER — Ambulatory Visit (INDEPENDENT_AMBULATORY_CARE_PROVIDER_SITE_OTHER): Payer: Medicare Other

## 2021-09-05 DIAGNOSIS — I4819 Other persistent atrial fibrillation: Secondary | ICD-10-CM | POA: Diagnosis not present

## 2021-09-05 LAB — CUP PACEART REMOTE DEVICE CHECK
Battery Remaining Longevity: 65 mo
Battery Remaining Percentage: 76 %
Battery Voltage: 2.98 V
Brady Statistic AP VP Percent: 72 %
Brady Statistic AP VS Percent: 1 %
Brady Statistic AS VP Percent: 27 %
Brady Statistic AS VS Percent: 1 %
Brady Statistic RA Percent Paced: 48 %
Date Time Interrogation Session: 20230829020659
Implantable Lead Implant Date: 20220223
Implantable Lead Implant Date: 20220223
Implantable Lead Implant Date: 20220223
Implantable Lead Location: 753858
Implantable Lead Location: 753859
Implantable Lead Location: 753860
Implantable Pulse Generator Implant Date: 20220223
Lead Channel Impedance Value: 480 Ohm
Lead Channel Impedance Value: 580 Ohm
Lead Channel Impedance Value: 650 Ohm
Lead Channel Pacing Threshold Amplitude: 0.5 V
Lead Channel Pacing Threshold Amplitude: 1 V
Lead Channel Pacing Threshold Amplitude: 1 V
Lead Channel Pacing Threshold Pulse Width: 0.5 ms
Lead Channel Pacing Threshold Pulse Width: 0.5 ms
Lead Channel Pacing Threshold Pulse Width: 0.8 ms
Lead Channel Sensing Intrinsic Amplitude: 11.8 mV
Lead Channel Sensing Intrinsic Amplitude: 2.9 mV
Lead Channel Setting Pacing Amplitude: 2 V
Lead Channel Setting Pacing Amplitude: 2 V
Lead Channel Setting Pacing Amplitude: 2.5 V
Lead Channel Setting Pacing Pulse Width: 0.5 ms
Lead Channel Setting Pacing Pulse Width: 0.8 ms
Lead Channel Setting Sensing Sensitivity: 2 mV
Pulse Gen Model: 3562
Pulse Gen Serial Number: 3861599

## 2021-09-08 ENCOUNTER — Telehealth: Payer: Self-pay | Admitting: Cardiology

## 2021-09-08 DIAGNOSIS — Z880 Allergy status to penicillin: Secondary | ICD-10-CM | POA: Diagnosis not present

## 2021-09-08 DIAGNOSIS — Z888 Allergy status to other drugs, medicaments and biological substances status: Secondary | ICD-10-CM | POA: Diagnosis not present

## 2021-09-08 DIAGNOSIS — R42 Dizziness and giddiness: Secondary | ICD-10-CM | POA: Diagnosis not present

## 2021-09-08 DIAGNOSIS — I509 Heart failure, unspecified: Secondary | ICD-10-CM | POA: Diagnosis not present

## 2021-09-08 DIAGNOSIS — R0602 Shortness of breath: Secondary | ICD-10-CM | POA: Diagnosis not present

## 2021-09-08 DIAGNOSIS — I429 Cardiomyopathy, unspecified: Secondary | ICD-10-CM | POA: Diagnosis not present

## 2021-09-08 DIAGNOSIS — I2510 Atherosclerotic heart disease of native coronary artery without angina pectoris: Secondary | ICD-10-CM | POA: Diagnosis not present

## 2021-09-08 DIAGNOSIS — Z79899 Other long term (current) drug therapy: Secondary | ICD-10-CM | POA: Diagnosis not present

## 2021-09-08 DIAGNOSIS — I10 Essential (primary) hypertension: Secondary | ICD-10-CM | POA: Diagnosis not present

## 2021-09-08 DIAGNOSIS — I110 Hypertensive heart disease with heart failure: Secondary | ICD-10-CM | POA: Diagnosis not present

## 2021-09-08 DIAGNOSIS — R110 Nausea: Secondary | ICD-10-CM | POA: Diagnosis not present

## 2021-09-08 DIAGNOSIS — R4182 Altered mental status, unspecified: Secondary | ICD-10-CM | POA: Diagnosis not present

## 2021-09-08 DIAGNOSIS — Z885 Allergy status to narcotic agent status: Secondary | ICD-10-CM | POA: Diagnosis not present

## 2021-09-08 NOTE — Telephone Encounter (Signed)
Dr. Shon Baton notified & verbalized understanding.  Stated that she has been discharged.

## 2021-09-08 NOTE — Telephone Encounter (Signed)
Spoke with Dr. Shon Baton with [facility] - states patient came in with dizziness, hx of vertigo, some left arm pain that felt different to her & nausea.  Symptoms resolved with Phenergan.  Labs & EKG stable.  Stated patient was concerned due to her heart history.  Provider wants to know if you think admit for observation would be warranted or okay for discharge.  Provider states she is doing fine now.  Patient does also have OV scheduled for 09/27/21 in [place] office with SM.

## 2021-09-08 NOTE — Telephone Encounter (Signed)
[place] from [facility] ER called stating that Dr. Shon Baton would like to speak to Dr. Domenic Polite. She stated she was not exactly sure what it was about but pt is being seen for weakness  Best callback number: [phone]

## 2021-09-18 ENCOUNTER — Encounter: Payer: Self-pay | Admitting: *Deleted

## 2021-09-18 ENCOUNTER — Telehealth: Payer: Self-pay | Admitting: *Deleted

## 2021-09-18 NOTE — Patient Outreach (Signed)
  Care Coordination   Initial Visit Note   09/18/2021 Name: [name] MRN: [id number] DOB: [birth year]  [name] is a 88 y.o. year old female who sees [place] Nation, MD for primary care. I spoke with  [name] by phone today.  What matters to the patients health and wellness today?  Member report she was in the ED last week for dizziness, has history of vertigo.  She has been taking Meclizine intermittently since discharge with relief.  Cardiac work up done while in the ED was unremarkable, she will follow up with them on 9/20.  Will also follow up with PCP in November.  Denies any urgent concerns, denies need for follow up at this time, encouraged to contact this care manager with questions.      Goals Addressed             This Visit's Progress    COMPLETED: Care Coordination activities - no follow up needed       Care Coordination Interventions: Basic overview and discussion of pathophysiology of Heart Failure reviewed Provided education on low sodium diet Reviewed Heart Failure Action Plan in depth and provided written copy Assessed need for readable accurate scales in home Provided education about placing scale on hard, flat surface Advised patient to weigh each morning after emptying bladder Discussed importance of daily weight and advised patient to weigh and record daily Reviewed role of diuretics in prevention of fluid overload and management of heart failure; Discussed the importance of keeping all appointments with provider Assessed social determinant of health barriers          SDOH assessments and interventions completed:  Yes  SDOH Interventions Today    Flowsheet Row Most Recent Value  SDOH Interventions   Food Insecurity Interventions Intervention Not Indicated  Housing Interventions Intervention Not Indicated  Transportation Interventions Intervention Not Indicated  Utilities Interventions Intervention Not Indicated         Care Coordination Interventions Activated:  Yes  Care Coordination Interventions:  Yes, provided   Follow up plan: No further intervention required.   Encounter Outcome:  Pt. Visit Completed   Valente David, RN, MSN, [facility] Care Management Care Management Coordinator [phone]

## 2021-09-18 NOTE — Patient Instructions (Signed)
Visit Information  Thank you for taking time to visit with me today. Please don't hesitate to contact me if I can be of assistance to you.  Following are the goals we discussed today:  Continue daily weights.  Consider compression socks/hose if you have leg swelling.   Please call the Suicide and Crisis Lifeline: 988 call the [place] National Suicide Prevention Lifeline: (250)412-0064 or TTY: 781 560 6890 TTY 562-650-5810) to talk to a trained counselor call 1-800-273-TALK (toll free, 24 hour hotline) if you are experiencing a Mental Health or [place] or need someone to talk to.  Patient verbalizes understanding of instructions and care plan provided today and agrees to view in [place]. Active MyChart status and patient understanding of how to access instructions and care plan via MyChart confirmed with patient.     The patient has been provided with contact information for the care management team and has been advised to call with any health related questions or concerns.   Valente David, RN, MSN, [place] Care Management Care Management Coordinator [phone]

## 2021-09-27 ENCOUNTER — Ambulatory Visit: Payer: Medicare Other | Admitting: Cardiology

## 2021-09-29 NOTE — Progress Notes (Signed)
Remote pacemaker transmission.   

## 2021-10-02 ENCOUNTER — Telehealth: Payer: Self-pay | Admitting: Cardiology

## 2021-10-02 ENCOUNTER — Other Ambulatory Visit: Payer: Self-pay | Admitting: Cardiology

## 2021-10-02 NOTE — Telephone Encounter (Signed)
Prescription refill request for Xarelto received.   Indication: aflutter  Last office visit:[place], 08/02/2021 Weight: 99.2 kg  Age:88 yo  Scr: 0.86, 09/08/2021 CrCl: 69 ml/min   Refill sent.

## 2021-10-02 NOTE — Telephone Encounter (Signed)
Pt c/o Shortness Of Breath: STAT if SOB developed within the last 24 hours or pt is noticeably SOB on the phone  1. Are you currently SOB (can you hear that pt is SOB on the phone)? No  2. How long have you been experiencing SOB? Few months  3. Are you SOB when sitting or when up moving around? Moving around  4. Are you currently experiencing any other symptoms? Has no energy

## 2021-10-02 NOTE — Telephone Encounter (Signed)
Pt reports she has not heard from office to schedule appt w/ the AFib clinic. Aware someone will call her today/tomorrow to arrange appt Spoke to AFib clinic who will reach back out to pt (they have called and left a message).

## 2021-10-02 NOTE — Telephone Encounter (Signed)
Patient states that she has been waiting on a call from [name] with DR. Clare office in regards to what she should do about her SOB and whether or not she needs to be seen at the A Fib clinic. Please advise

## 2021-10-04 ENCOUNTER — Encounter (HOSPITAL_COMMUNITY): Payer: Self-pay | Admitting: Physician Assistant

## 2021-10-04 ENCOUNTER — Ambulatory Visit (HOSPITAL_COMMUNITY)
Admission: RE | Admit: 2021-10-04 | Discharge: 2021-10-04 | Disposition: A | Discharge status: Home / Self Care | Payer: Medicare Other | Source: Ambulatory Visit | Attending: Physician Assistant | Admitting: Physician Assistant

## 2021-10-04 VITALS — BP 118/80 | HR 71 | Ht 63.0 in | Wt 215.2 lb

## 2021-10-04 DIAGNOSIS — K5190 Ulcerative colitis, unspecified, without complications: Secondary | ICD-10-CM | POA: Insufficient documentation

## 2021-10-04 DIAGNOSIS — E785 Hyperlipidemia, unspecified: Secondary | ICD-10-CM | POA: Insufficient documentation

## 2021-10-04 DIAGNOSIS — D6869 Other thrombophilia: Secondary | ICD-10-CM | POA: Diagnosis not present

## 2021-10-04 DIAGNOSIS — I4892 Unspecified atrial flutter: Secondary | ICD-10-CM | POA: Insufficient documentation

## 2021-10-04 DIAGNOSIS — Z7901 Long term (current) use of anticoagulants: Secondary | ICD-10-CM | POA: Insufficient documentation

## 2021-10-04 DIAGNOSIS — Z6838 Body mass index (BMI) 38.0-38.9, adult: Secondary | ICD-10-CM | POA: Diagnosis not present

## 2021-10-04 DIAGNOSIS — I10 Essential (primary) hypertension: Secondary | ICD-10-CM | POA: Diagnosis not present

## 2021-10-04 DIAGNOSIS — E669 Obesity, unspecified: Secondary | ICD-10-CM | POA: Diagnosis not present

## 2021-10-04 DIAGNOSIS — I428 Other cardiomyopathies: Secondary | ICD-10-CM | POA: Insufficient documentation

## 2021-10-04 DIAGNOSIS — I4819 Other persistent atrial fibrillation: Secondary | ICD-10-CM | POA: Insufficient documentation

## 2021-10-04 DIAGNOSIS — I2510 Atherosclerotic heart disease of native coronary artery without angina pectoris: Secondary | ICD-10-CM | POA: Insufficient documentation

## 2021-10-04 NOTE — Progress Notes (Signed)
Primary Care Physician: Wauregan Nation, MD Primary Cardiologist: Dr Domenic Polite  Primary Electrophysiologist: Dr Curt Bears Referring Physician: Dr Everette Rank is a 88 y.o. female with a history of atrial flutter, atrial fibrillation, CAD, NICM s/p CRT-P, HTN, HLD, ulcerative colitis who presents for follow up in the [facility]. Last seen by Roderic Palau 2019. The patient has been maintained on dofetilide for rhythm control. Patient is on Xarelto for a CHADS2VASC score of 6. The device clinic received an alert for an afib episode on 03/17/20 lasting about 2 days. Patient does report she felt more fatigued during that episode.   On follow up today, patient reports that she has noticed an increase in her symptoms of chronic dizziness, fatigue, and SOB. Her PPM shows an increase in her afib up to 31%. There are no specific triggers that she could identify. No bleeding issues on anticoagulation.   Today, she denies symptoms of palpitations, chest pain, orthopnea, PND, presyncope, syncope, snoring, daytime somnolence, bleeding, or neurologic sequela. The patient is tolerating medications without difficulties and is otherwise without complaint today.    Atrial Fibrillation Risk Factors:  she does not have symptoms or diagnosis of sleep apnea. she does not have a history of rheumatic fever.   she has a BMI of Body mass index is 38.12 kg/m.Marland Kitchen Filed Weights   10/04/21 1429  Weight: 97.6 kg    Family History  Problem Relation Age of Onset   CAD Father    Diabetes Father    Heart attack Father      Atrial Fibrillation Management history:  Previous antiarrhythmic drugs: amiodarone, dofetilide  Previous cardioversions: 2019 x3 Previous ablations: none CHADS2VASC score: 6 Anticoagulation history: Xarelto   Past Medical History:  Diagnosis Date   Arthritis    Atrial fibrillation ([place])    Atrial flutter ([place])    Bladder cancer ([place])     Cardiomyopathy ([place])    Coronary atherosclerosis of native coronary artery    a. BMS RCA May 2014 - [place] stent. b. Cath 04/2017 - patent stent, minimal CAD otherwise.   Depression    Essential hypertension    GI bleeding 06/2017   a. melena/small bowel ulcer by capsule endo 06/2017.   Hyperlipemia    Macular degeneration    NICM (nonischemic cardiomyopathy) (HCC)    Persistent atrial fibrillation (HCC)    Presence of permanent cardiac pacemaker    Ulcerative colitis    Past Surgical History:  Procedure Laterality Date   ABDOMINAL HYSTERECTOMY     APPENDECTOMY     Bilateral knee replacements      2007, 2008   BIV PACEMAKER INSERTION CRT-P N/A 03/02/2020   Procedure: BIV PACEMAKER INSERTION CRT-P;  Surgeon: Constance Haw, MD;  Location: [facility];  Service: Cardiovascular;  Laterality: N/A;   BLADDER SURGERY     CARDIOVERSION N/A 02/04/2017   Procedure: CARDIOVERSION;  Surgeon: Arnoldo Lenis, MD;  [facility];  Service: Endoscopy;  Laterality: N/A;   CARDIOVERSION N/A 02/26/2017   Procedure: CARDIOVERSION;  Surgeon: Satira Sark, MD;  Location: [facility];  Service: Cardiovascular;  Laterality: N/A;   CARDIOVERSION N/A 08/07/2017   Procedure: CARDIOVERSION;  Surgeon: Pixie Casino, MD;  Location: [place];  Service: Cardiovascular;  Laterality: N/A;   COLONOSCOPY N/A 06/15/2015   Procedure: COLONOSCOPY;  Surgeon: Rogene Houston, MD;  [facility];  Service: Endoscopy;  Laterality: N/A;  210  CYSTOSCOPY N/A 11/07/2020   Procedure: CYSTOSCOPY;  Surgeon: Cleon Gustin, MD;  Location: [facility];  Service: Urology;  Laterality: N/A;   CYSTOSCOPY W/ RETROGRADES Bilateral 05/14/2016   Procedure: CYSTOSCOPY WITH BILATERAL RETROGRADE PYELOGRAM;  Surgeon: Raynelle Bring, MD;  [facility];  Service: Urology;  Laterality: Bilateral;  GENERAL ANESTHESIA WITH PARALYSIS   ESOPHAGOGASTRODUODENOSCOPY (EGD) WITH PROPOFOL N/A 06/14/2017    Procedure: ESOPHAGOGASTRODUODENOSCOPY (EGD) WITH PROPOFOL;  Surgeon: Rogene Houston, MD;  [facility];  Service: Endoscopy;  Laterality: N/A;   GIVENS CAPSULE STUDY  06/14/2017   Procedure: GIVENS CAPSULE STUDY;  Surgeon: Rogene Houston, MD;  [facility];  Service: Endoscopy;;   LEFT HEART CATHETERIZATION WITH CORONARY ANGIOGRAM N/A 10/30/2013   Procedure: LEFT HEART CATHETERIZATION WITH CORONARY ANGIOGRAM;  Surgeon: Burnell Blanks, MD;  Location: [facility] C[facility] LAB;  Service: Cardiovascular;  Laterality: N/A;   RIGHT/LEFT HEART CATH AND CORONARY ANGIOGRAPHY N/A 05/03/2017   Procedure: RIGHT/LEFT HEART CATH AND CORONARY ANGIOGRAPHY;  Surgeon: Leonie Man, MD;  Location: [place] [facility];  Service: Cardiovascular;  Laterality: N/A;   TEE WITHOUT CARDIOVERSION N/A 02/04/2017   Procedure: TRANSESOPHAGEAL ECHOCARDIOGRAM (TEE) WITH PROPOL;  Surgeon: Arnoldo Lenis, MD;  [facility];  Service: Endoscopy;  Laterality: N/A;   TONSILLECTOMY     TOTAL KNEE ARTHROPLASTY     TRANSURETHRAL RESECTION OF BLADDER TUMOR N/A 05/14/2016   Procedure: TRANSURETHRAL RESECTION OF BLADDER TUMOR (TURBT);  Surgeon: Raynelle Bring, MD;  [facility];  Service: Urology;  Laterality: N/A;  GENERAL ANESTHESIA WITH PARALYSIS   TRANSURETHRAL RESECTION OF BLADDER TUMOR N/A 11/07/2020   Procedure: TRANSURETHRAL RESECTION OF BLADDER TUMOR (TURBT);  Surgeon: Cleon Gustin, MD;  [facility];  Service: Urology;  Laterality: N/A;   YAG LASER APPLICATION Bilateral 78/06/7670   Procedure: YAG LASER APPLICATION;  Surgeon: Williams Che, MD;  [facility];  Service: Ophthalmology;  Laterality: Bilateral;    Current Outpatient Medications  Medication Sig Dispense Refill   acetaminophen (TYLENOL) 500 MG tablet Take 1,000 mg by mouth daily.     atorvastatin (LIPITOR) 20 MG tablet TAKE 1 TABLET BY MOUTH AT BEDTIME 90 tablet 0   balsalazide (COLAZAL) 750 MG capsule TAKE  THREE CAPSULES BY MOUTH THREE TIMES DAILY 270 capsule 11   bisoprolol (ZEBETA) 5 MG tablet TAKE 1/2 TABLET BY MOUTH DAILY 45 tablet 1   Carboxymethylcellulose Sodium (THERATEARS) 0.25 % SOLN Place 1 drop into both eyes in the morning and at bedtime.     CRANBERRY PO Take 4,200 mg by mouth daily. With vit C     dofetilide (TIKOSYN) 250 MCG capsule TAKE ONE CAPSULE BY MOUTH TWICE DAILY 60 capsule 6   ENTRESTO 24-26 MG TAKE 1 TABLET BY MOUTH TWICE DAILY - start ENTRESTO 48 HOURS AFTER STOPPING LISINOPRIL 60 tablet 6   furosemide (LASIX) 20 MG tablet TAKE 2 TABLETS BY MOUTH TWICE DAILY 360 tablet 1   meclizine (ANTIVERT) 25 MG tablet Take 25 mg by mouth 3 (three) times daily as needed.     Multiple Vitamins-Minerals (ICAPS AREDS 2 PO) Take 2 tablets by mouth daily.     nitroGLYCERIN (NITROSTAT) 0.4 MG SL tablet Place 0.4 mg under the tongue every 5 (five) minutes as needed for chest pain.     OVER THE COUNTER MEDICATION Vit D 3 25 mcg (1,000units) one tablet bid.     potassium chloride SA (KLOR-CON M) 20 MEQ tablet Take 1 tablet (20  mEq total) by mouth 2 (two) times daily. 180 tablet 1   psyllium (METAMUCIL SMOOTH TEXTURE) 58.6 % powder Take 1 packet by mouth at bedtime. 283 g 12   rivaroxaban (XARELTO) 20 MG TABS tablet TAKE 1 TABLET BY MOUTH DAILY WITH SUPPER 90 tablet 1   sodium chloride (MURO 128) 2 % ophthalmic solution Place 1 drop into both eyes in the morning and at bedtime.     spironolactone (ALDACTONE) 25 MG tablet TAKE 1/2 TABLET BY MOUTH EVERY DAY 45 tablet 0   No current facility-administered medications for this encounter.    Allergies  Allergen Reactions   Chlor-Trimeton [Chlorpheniramine] Shortness Of Breath   Carvedilol Itching and Rash    Facial rash/itching   Demerol Rash   Penicillins Rash and Other (See Comments)    REACTION: rash, years ago Has patient had a PCN reaction causing immediate rash, facial/tongue/throat swelling, SOB or lightheadedness with hypotension:  Yes Has patient had a PCN reaction causing severe rash involving mucus membranes or skin necrosis: No Has patient had a PCN reaction that required hospitalization No Has patient had a PCN reaction occurring within the last 10 years: No If all of the above answers are "NO", then may proceed with Cephalosporin use.     Social History   Socioeconomic History   Marital status: Married    Spouse name: Not on file   Number of children: 3   Years of education: Not on file   Highest education level: Not on file  Occupational History   Not on file  Tobacco Use   Smoking status: Never    Passive exposure: Past   Smokeless tobacco: Never   Tobacco comments:    Never smoke 10/04/21  Vaping Use   Vaping Use: Never used  Substance and Sexual Activity   Alcohol use: No    Alcohol/week: 0.0 standard drinks of alcohol   Drug use: No   Sexual activity: Not on file  Other Topics Concern   Not on file  Social History Narrative   Lives with husband   Social Determinants of Health   Financial Resource Strain: Not on file  Food Insecurity: No Food Insecurity (09/18/2021)   Hunger Vital Sign    Worried About Running Out of Food in the Last Year: Never true    Ran Out of Food in the Last Year: Never true  Transportation Needs: No Transportation Needs (09/18/2021)   PRAPARE - Hydrologist (Medical): No    Lack of Transportation (Non-Medical): No  Physical Activity: Not on file  Stress: Not on file  Social Connections: Not on file  Intimate Partner Violence: Not on file     ROS- All systems are reviewed and negative except as per the HPI above.  Physical Exam: Vitals:   10/04/21 1429  BP: 118/80  Pulse: 71  Weight: 97.6 kg  Height: 5' 3"  (1.6 m)     GEN- The patient is a well appearing elderly female, alert and oriented x 3 today.   HEENT-head normocephalic, atraumatic, sclera clear, conjunctiva pink, hearing intact, trachea midline. Lungs- Clear to  ausculation bilaterally, normal work of breathing Heart- Regular rate and rhythm, no murmurs, rubs or gallops  GI- soft, NT, ND, + BS Extremities- no clubbing, cyanosis, or edema MS- no significant deformity or atrophy Skin- no rash or lesion Psych- euthymic mood, full affect Neuro- strength and sensation are intact   Wt Readings from Last 3 Encounters:  10/04/21 97.6 kg  08/02/21 99.2 kg  04/13/21 99.3 kg    EKG today demonstrates  A sense V paced rhythm Vent. rate 71 BPM PR interval 176 ms QRS duration 204 ms QT/QTcB 484/525 ms  Echo 03/14/21 demonstrated  1. Left ventricular ejection fraction, by estimation, is 40%. The left  ventricle has low normal function. The left ventricle demonstrates global hypokinesis. There is mild left ventricular hypertrophy. Left ventricular diastolic parameters are indeterminate.   2. RV not well visualized. Grossly appears normal in size and function.  Right ventricular systolic function was not well visualized. The right  ventricular size is not well visualized.   3. The mitral valve was not well visualized. No evidence of mitral valve  regurgitation. No evidence of mitral stenosis.   4. The aortic valve is tricuspid. Aortic valve regurgitation is not  visualized. No aortic stenosis is present.   5. The inferior vena cava is normal in size with greater than 50%  respiratory variability, suggesting right atrial pressure of 3 mmHg.   Comparison(s): LVEF 30-35%.   Epic records are reviewed at length today  CHA2DS2-VASc Score = 6  The patient's score is based upon: CHF History: 1 HTN History: 1 Diabetes History: 0 Stroke History: 0 Vascular Disease History: 1 Age Score: 2 Gender Score: 1        ASSESSMENT AND PLAN: 1. Persistent Atrial Fibrillation (ICD10:  I48.19) The patient's CHA2DS2-VASc score is 6, indicating a 9.7% annual risk of stroke.   Patient has had recent increase in afib burden (31% up from 5%). She is in A sense V  paced rhythm today. Recall she has already failed amiodarone due to ineffectiveness. No good alternate AAD options with CAD and CHF.  Continue dofetilide 250 mcg BID. QT stable Recent labs reviewed.  Continue bisoprolol 2.5 mg daily. Offered to increase this, patient declined at this time. Will continue present therapy for now.  Continue Xarelto 20 mg daily  2. Secondary Hypercoagulable State (ICD10:  D68.69) The patient is at significant risk for stroke/thromboembolism based upon her CHA2DS2-VASc Score of 6.  Continue Rivaroxaban (Xarelto).   3. Obesity Body mass index is 38.12 kg/m. Lifestyle modification was discussed and encouraged including regular physical activity and weight reduction.  4. CAD No anginal symptoms.  5. NICM EF 40% S/p CRT-P Fluid status appears stable.  6. HTN Stable, no changes today.   Follow up with Tommye Standard PA as scheduled.    [facility] [address] [place], [place] [address] [phone] 10/04/2021 4:09 PM

## 2021-10-16 DIAGNOSIS — N390 Urinary tract infection, site not specified: Secondary | ICD-10-CM | POA: Diagnosis not present

## 2021-10-16 DIAGNOSIS — Z6838 Body mass index (BMI) 38.0-38.9, adult: Secondary | ICD-10-CM | POA: Diagnosis not present

## 2021-10-18 ENCOUNTER — Ambulatory Visit: Payer: Medicare Other | Admitting: Physician Assistant

## 2021-10-20 DIAGNOSIS — H353131 Nonexudative age-related macular degeneration, bilateral, early dry stage: Secondary | ICD-10-CM | POA: Diagnosis not present

## 2021-10-25 ENCOUNTER — Ambulatory Visit: Payer: Medicare Other | Admitting: Physician Assistant

## 2021-10-25 VITALS — BP 103/68 | HR 60 | Wt 216.0 lb

## 2021-10-25 DIAGNOSIS — R109 Unspecified abdominal pain: Secondary | ICD-10-CM | POA: Diagnosis not present

## 2021-10-25 DIAGNOSIS — C678 Malignant neoplasm of overlapping sites of bladder: Secondary | ICD-10-CM

## 2021-10-25 DIAGNOSIS — R8271 Bacteriuria: Secondary | ICD-10-CM

## 2021-10-25 DIAGNOSIS — Z8551 Personal history of malignant neoplasm of bladder: Secondary | ICD-10-CM | POA: Diagnosis not present

## 2021-10-25 DIAGNOSIS — N3941 Urge incontinence: Secondary | ICD-10-CM | POA: Diagnosis not present

## 2021-10-25 LAB — BLADDER SCAN AMB NON-IMAGING: Scan Result: 0

## 2021-10-25 MED ORDER — MIRABEGRON ER 25 MG PO TB24: 25.0000 mg | ORAL_TABLET | Freq: Every day | ORAL | 11 refills | Status: DC

## 2021-10-25 NOTE — Progress Notes (Signed)
Assessment: 1. Flank pain  2. Urgency incontinence  3. Bacteriuria - Urine Culture  4. Malignant neoplasm of overlapping sites of bladder (HCC) - Urinalysis, Routine w reflex microscopic - BLADDER SCAN AMB NON-IMAGING    Plan: Urine is sent for culture.  If positive, will change treatment.  Trial of Myrbetriq and samples given.  Flank pain is likely due to radicular symptoms from her DJD or referred pain from her gallbladder disease.  If symptoms persist or progress in spite of over-the-counter treatment, will assess further with imaging.  She will keep her scheduled follow-up with Dr. Alyson [name] next month.  Meds ordered this encounter  Medications   mirabegron ER (MYRBETRIQ) 25 MG TB24 tablet    Sig: Take 1 tablet (25 mg total) by mouth daily.    Dispense:  30 tablet    Refill:  11     Chief Complaint: No chief complaint on file.   HPI: [name] is a 88 y.o. female with h/o bladder tumor who presents for evaluation of 2-week history of right sided flank pain which does not radiate.  She reports a history of gallbladder disease and has had similar pain in the past.  Also reports a history of degenerative spine disease.  Patient reports being treated for UTI by her primary care provider last week with Bactrim DS.  She has just finished the prescription.  Patient also complains of 2-year history of progressively worsening urge incontinence with significant leakage over the past year.  She goes through multiple pads per day, 2-4 pads at night and changes in underwear and pajamas several times daily.  She has remote history of UTIs in the past and does not feel that she empties completely.  She denies fever, chills, dysuria.  No gross hematuria.  No history of urinary stone disease.  UA= moderate bacteria, nitrite negative and otherwise clear. PVR=15m  11/16/20 [name] a [id number] for followup after bladder tumor resection. Pathology showed inflammation. No significant  LUTS. NO gross hematuria. No other complaints today  Portions of the above documentation were copied from a prior visit for review purposes only.  Allergies: Allergies  Allergen Reactions   Chlor-Trimeton [Chlorpheniramine] Shortness Of Breath   Carvedilol Itching and Rash    Facial rash/itching   Demerol Rash   Penicillins Rash and Other (See Comments)    REACTION: rash, years ago Has patient had a PCN reaction causing immediate rash, facial/tongue/throat swelling, SOB or lightheadedness with hypotension: Yes Has patient had a PCN reaction causing severe rash involving mucus membranes or skin necrosis: No Has patient had a PCN reaction that required hospitalization No Has patient had a PCN reaction occurring within the last 10 years: No If all of the above answers are "NO", then may proceed with Cephalosporin use.     PMH: Past Medical History:  Diagnosis Date   Arthritis    Atrial fibrillation ([facility]    [place] ([place]    [place]    Cardiomyopathy ([place]    Coronary atherosclerosis of native coronary artery    [place]. Cath [place] - patent stent, minimal CAD otherwise.   Depression    Essential hypertension    GI bleeding 06/2017   a. melena/small bowel ulcer by capsule endo 06/2017.   Hyperlipemia    Macular degeneration    NICM (nonischemic cardiomyopathy) ([place]    Persistent atrial fibrillation (HCC)    Presence of permanent cardiac pacemaker  Ulcerative colitis     PSH: Past Surgical History:  Procedure Laterality Date   ABDOMINAL HYSTERECTOMY     APPENDECTOMY     Bilateral knee replacements      2007, 2008   BIV PACEMAKER INSERTION CRT-P N/A 03/02/2020   Procedure: BIV PACEMAKER INSERTION CRT-P;  Surgeon: Constance Haw, MD;  Location: [place] [facility];  Service: Cardiovascular;  Laterality: N/A;   BLADDER SURGERY     CARDIOVERSION N/A 02/04/2017   Procedure: CARDIOVERSION;  Surgeon: Arnoldo Lenis, MD;  [facility];  Service: Endoscopy;  Laterality: N/A;   CARDIOVERSION N/A 02/26/2017   Procedure: CARDIOVERSION;  Surgeon: Satira Sark, MD;  [facility];  Service: Cardiovascular;  Laterality: N/A;   CARDIOVERSION N/A 08/07/2017   Procedure: CARDIOVERSION;  Surgeon: Pixie Casino, MD;  Location: [place];  Service: Cardiovascular;  Laterality: N/A;   COLONOSCOPY N/A 06/15/2015   Procedure: COLONOSCOPY;  Surgeon: Rogene Houston, MD;  [facility];  Service: Endoscopy;  Laterality: N/A;  210   CYSTOSCOPY N/A 11/07/2020   Procedure: CYSTOSCOPY;  Surgeon: Cleon Gustin, MD;  [facility];  Service: Urology;  Laterality: N/A;   CYSTOSCOPY W/ RETROGRADES Bilateral 05/14/2016   Procedure: CYSTOSCOPY WITH BILATERAL RETROGRADE PYELOGRAM;  Surgeon: Raynelle Bring, MD;  [facility];  Service: Urology;  Laterality: Bilateral;  GENERAL ANESTHESIA WITH PARALYSIS   ESOPHAGOGASTRODUODENOSCOPY (EGD) WITH PROPOFOL N/A 06/14/2017   Procedure: ESOPHAGOGASTRODUODENOSCOPY (EGD) WITH PROPOFOL;  Surgeon: Rogene Houston, MD;  [facility];  Service: Endoscopy;  Laterality: N/A;   GIVENS CAPSULE STUDY  06/14/2017   Procedure: GIVENS CAPSULE STUDY;  Surgeon: Rogene Houston, MD;  [facility];  Service: Endoscopy;;   LEFT HEART CATHETERIZATION WITH CORONARY ANGIOGRAM N/A 10/30/2013   Procedure: LEFT HEART CATHETERIZATION WITH CORONARY ANGIOGRAM;  Surgeon: Burnell Blanks, MD;  Location: [facility] CATH LAB;  Service: Cardiovascular;  Laterality: N/A;   RIGHT/LEFT HEART CATH AND CORONARY ANGIOGRAPHY N/A 05/03/2017   Procedure: RIGHT/LEFT HEART CATH AND CORONARY ANGIOGRAPHY;  Surgeon: Leonie Man, MD;  Location: [place] [facility] LAB;  Service: Cardiovascular;  Laterality: N/A;   TEE WITHOUT CARDIOVERSION N/A 02/04/2017   Procedure: TRANSESOPHAGEAL ECHOCARDIOGRAM (TEE) WITH PROPOL;  Surgeon: Arnoldo Lenis, MD;  [facility];   Service: Endoscopy;  Laterality: N/A;   TONSILLECTOMY     TOTAL KNEE ARTHROPLASTY     TRANSURETHRAL RESECTION OF BLADDER TUMOR N/A 05/14/2016   Procedure: TRANSURETHRAL RESECTION OF BLADDER TUMOR (TURBT);  Surgeon: Raynelle Bring, MD;  [facility];  Service: Urology;  Laterality: N/A;  GENERAL ANESTHESIA WITH PARALYSIS   TRANSURETHRAL RESECTION OF BLADDER TUMOR N/A 11/07/2020   Procedure: TRANSURETHRAL RESECTION OF BLADDER TUMOR (TURBT);  Surgeon: Cleon Gustin, MD;  [facility];  Service: Urology;  Laterality: N/A;   YAG LASER APPLICATION Bilateral 85/04/6268   Procedure: YAG LASER APPLICATION;  Surgeon: Williams Che, MD;  [facility];  Service: Ophthalmology;  Laterality: Bilateral;    SH: Social History   Tobacco Use   Smoking status: Never    Passive exposure: Past   Smokeless tobacco: Never   Tobacco comments:    Never smoke 10/04/21  Vaping Use   Vaping Use: Never used  Substance Use Topics   Alcohol use: No    Alcohol/week: 0.0 standard drinks of alcohol   Drug use: No    ROS: All other review of systems were reviewed and are  negative except what is noted above in HPI  PE: BP 103/68   Pulse 60   Wt 216 lb (98 kg)   BMI 38.26 kg/m  GENERAL APPEARANCE:  Well appearing, well developed, well nourished, NAD HEENT:  Atraumatic, normocephalic NECK:  Supple. Trachea midline ABDOMEN:  Soft, RUQ tenderness, no rebound, gaurding, no masses EXTREMITIES:  Moves all extremities well, without clubbing, cyanosis, or edema NEUROLOGIC:  Alert and oriented x 3, ambulates on a walker MENTAL STATUS:  appropriate BACK:  Non-tender to palpation, No CVAT SKIN:  Warm, dry, and intact   Results: Laboratory Data: Lab Results  Component Value Date   WBC 8.6 08/02/2021   HGB 13.5 08/02/2021   HCT 39.9 08/02/2021   MCV 90 08/02/2021   PLT 329 08/02/2021    Lab Results  Component Value Date   CREATININE 0.94 08/02/2021    No results found for: "PSA"  No  results found for: "TESTOSTERONE"  No results found for: "HGBA1C"  Urinalysis    Component Value Date/Time   COLORURINE STRAW (A) 06/08/2019 1116   APPEARANCEUR Cloudy (A) 11/25/2020 1214   LABSPEC 1.005 06/08/2019 1116   PHURINE 7.0 06/08/2019 1116   GLUCOSEU Negative 11/25/2020 [place] 06/08/2019 1116   BILIRUBINUR Negative 11/25/2020 [place] 06/08/2019 1116   PROTEINUR Trace (A) 11/25/2020 1214   PROTEINUR NEGATIVE 06/08/2019 1116   NITRITE Negative 11/25/2020 1214   NITRITE NEGATIVE 06/08/2019 1116   LEUKOCYTESUR 1+ (A) 11/25/2020 1214   LEUKOCYTESUR NEGATIVE 06/08/2019 1116    Lab Results  Component Value Date   LABMICR See below: 11/25/2020   WBCUA 11-30 (A) 11/25/2020   LABEPIT 0-10 11/25/2020   BACTERIA Few 11/25/2020    Pertinent Imaging: Results for orders placed during the hospital encounter of 09/29/05  DG Abd 1 View  Narrative Clinical data:   Nausea, weakness.  PICC line. CHEST - 1 VIEW: Comparison:  09/06/05. Findings:  Mild cardiomegaly.  No pulmonary vascular congestion or active lung process.  PICC line tip is in the superior vena cava, estimated to be approximately 4.3 cm above the superior vena cava/right atrial junction. IMPRESSION: Mild cardiomegaly.  No acute chest findings. ABDOMEN - 2 VIEW: Findings:  Mild gaseous distention of colon in the hepatic flexure region.  Minimal small bowel distention.  No extraluminal gas. IMPRESSION: Findings compatible with mild nonspecific ileus.  Provider: Barbarann Ehlers  No results found for this or any previous visit.  No results found for this or any previous visit.  No results found for this or any previous visit.  Results for orders placed during the hospital encounter of 10/08/06  US Renal  Narrative Clinical Data: Urinary frequency. RENAL/URINARY TRACT ULTRASOUND: Technique: Complete ultrasound examination of the urinary tract was performed including evaluation  of the kidneys, renal collecting systems, and urinary bladder. Comparison: CT scan 12/14/04. Findings: The right kidney measures 12.3 cm in length and the left kidney measures 13.0 cm in length. No mass or hydronephrosis. There may be small stones in the lower pole of each kidney with one on the right measuring approximately 0.8 cm and on the left measuring approximately 0.7 cm. Incidental imaging of the urinary bladder is unremarkable. There is a hypoechoic focus in the region of the right adnexa measuring approximately 5.2 x 3.5 cm. This cannot be definitively characterized.  Impression Possible small bilateral renal stones without hydronephrosis. Possible right ovarian enlargement. Pelvic ultrasound could be used for further evaluation.  Provider: Mertie Clause  No valid procedures specified. No results found for this or any previous visit.  No results found for this or any previous visit.  No results found for this or any previous visit (from the past 24 hour(s)).

## 2021-10-25 NOTE — Progress Notes (Signed)
2:08 PM post void residual =0

## 2021-10-26 LAB — URINALYSIS, ROUTINE W REFLEX MICROSCOPIC
Bilirubin, UA: NEGATIVE
Glucose, UA: NEGATIVE
Ketones, UA: NEGATIVE
Nitrite, UA: NEGATIVE
Protein,UA: NEGATIVE
RBC, UA: NEGATIVE
Specific Gravity, UA: 1.01 (ref 1.005–1.030)
Urobilinogen, Ur: 0.2 mg/dL (ref 0.2–1.0)
pH, UA: 5.5 (ref 5.0–7.5)

## 2021-10-26 LAB — MICROSCOPIC EXAMINATION

## 2021-10-30 ENCOUNTER — Telehealth: Payer: Self-pay

## 2021-10-30 LAB — URINE CULTURE

## 2021-10-30 MED ORDER — FOSFOMYCIN TROMETHAMINE 3 G PO PACK: 3.0000 g | PACK | ORAL | 0 refills | Status: AC

## 2021-10-30 NOTE — Telephone Encounter (Signed)
Patient aware of PA's response to her urine culture and voiced understanding.

## 2021-10-30 NOTE — Telephone Encounter (Signed)
-----   Message from [place], [place] sent at 10/30/2021 12:53 PM EDT ----- Please let the pt know her cx is positive for 2 different types of bacteria. I have sent Rx for fosfomycin to her pharmacy. She will need to take 1 dose today and the second dose on Thursday. This medication will not react with the meds she is already on (Tycocin). Keep her appt as already scheduled with Dr. Alyson Ingles ----- Message ----- From: Interface, [facility] Lab Results In Sent: 10/26/2021   5:37 AM EDT To: Reynaldo Minium, PA-C

## 2021-10-31 ENCOUNTER — Encounter: Payer: Self-pay | Admitting: Physician Assistant

## 2021-10-31 ENCOUNTER — Telehealth: Payer: Self-pay

## 2021-10-31 NOTE — Telephone Encounter (Signed)
PA started on covermymeds for Fosfomycin

## 2021-11-03 NOTE — Telephone Encounter (Signed)
Medication denied per covermymeds

## 2021-11-05 ENCOUNTER — Other Ambulatory Visit: Payer: Self-pay | Admitting: Physician Assistant

## 2021-11-17 ENCOUNTER — Ambulatory Visit: Payer: Medicare Other | Admitting: Urology

## 2021-11-17 VITALS — BP 117/71 | HR 62

## 2021-11-17 DIAGNOSIS — Z8551 Personal history of malignant neoplasm of bladder: Secondary | ICD-10-CM

## 2021-11-17 DIAGNOSIS — N3941 Urge incontinence: Secondary | ICD-10-CM | POA: Diagnosis not present

## 2021-11-17 DIAGNOSIS — C678 Malignant neoplasm of overlapping sites of bladder: Secondary | ICD-10-CM | POA: Diagnosis not present

## 2021-11-17 LAB — URINALYSIS, ROUTINE W REFLEX MICROSCOPIC
Bilirubin, UA: NEGATIVE
Glucose, UA: NEGATIVE
Ketones, UA: NEGATIVE
Leukocytes,UA: NEGATIVE
Nitrite, UA: NEGATIVE
Protein,UA: NEGATIVE
RBC, UA: NEGATIVE
Specific Gravity, UA: 1.01 (ref 1.005–1.030)
Urobilinogen, Ur: 0.2 mg/dL (ref 0.2–1.0)
pH, UA: 6 (ref 5.0–7.5)

## 2021-11-17 MED ORDER — CEPHALEXIN 250 MG PO CAPS
500.0000 mg | ORAL_CAPSULE | Freq: Once | ORAL | Status: AC
  Administered 2021-11-17: 500 mg via ORAL

## 2021-11-17 MED ORDER — CEPHALEXIN 500 MG PO CAPS: 500.0000 mg | ORAL_CAPSULE | Freq: Once | ORAL | 0 refills | Status: DC

## 2021-11-17 MED ORDER — MIRABEGRON ER 25 MG PO TB24: 25.0000 mg | ORAL_TABLET | Freq: Every day | ORAL | 11 refills | Status: DC

## 2021-11-17 MED ORDER — CIPROFLOXACIN HCL 500 MG PO TABS: 500.0000 mg | ORAL_TABLET | Freq: Once | ORAL | Status: DC

## 2021-11-17 NOTE — Progress Notes (Signed)
11/17/2021 11:52 AM   [name] 1930/11/20 [id number]  Referring provider: Fredonia Nation, MD [place],  [place] [address]  No chief complaint on file.   HPI:    PMH: Past Medical History:  Diagnosis Date   Arthritis    Atrial fibrillation [facility])    Atrial flutter ([place])    Bladder cancer ([place])    Cardiomyopathy ([place])    Coronary atherosclerosis of native coronary artery    a. BMS RCA May 2014 - [place] stent. b. Cath 04/2017 - patent stent, minimal CAD otherwise.   Depression    Essential hypertension    GI bleeding 06/2017   a. melena/small bowel ulcer by capsule endo 06/2017.   Hyperlipemia    Macular degeneration    NICM (nonischemic cardiomyopathy) (HCC)    Persistent atrial fibrillation (HCC)    Presence of permanent cardiac pacemaker    Ulcerative colitis     Surgical History: Past Surgical History:  Procedure Laterality Date   ABDOMINAL HYSTERECTOMY     APPENDECTOMY     Bilateral knee replacements      2007, 2008   BIV PACEMAKER INSERTION CRT-P N/A 03/02/2020   Procedure: BIV PACEMAKER INSERTION CRT-P;  Surgeon: Constance Haw, MD;  Location: [place] [facility];  Service: Cardiovascular;  Laterality: N/A;   BLADDER SURGERY     CARDIOVERSION N/A 02/04/2017   Procedure: CARDIOVERSION;  Surgeon: Arnoldo Lenis, MD;  [facility];  Service: Endoscopy;  Laterality: N/A;   CARDIOVERSION N/A 02/26/2017   Procedure: CARDIOVERSION;  Surgeon: Satira Sark, MD;  Location: [facility];  Service: Cardiovascular;  Laterality: N/A;   CARDIOVERSION N/A 08/07/2017   Procedure: CARDIOVERSION;  Surgeon: Pixie Casino, MD;  Location: [employer];  Service: Cardiovascular;  Laterality: N/A;   COLONOSCOPY N/A 06/15/2015   Procedure: COLONOSCOPY;  Surgeon: Rogene Houston, MD;  [facility];  Service: Endoscopy;  Laterality: N/A;  210   CYSTOSCOPY N/A 11/07/2020   Procedure: CYSTOSCOPY;  Surgeon: Cleon Gustin, MD;   Location: [facility];  Service: Urology;  Laterality: N/A;   CYSTOSCOPY W/ RETROGRADES Bilateral 05/14/2016   Procedure: CYSTOSCOPY WITH BILATERAL RETROGRADE PYELOGRAM;  Surgeon: Raynelle Bring, MD;  Location: [facility];  Service: Urology;  Laterality: Bilateral;  GENERAL ANESTHESIA WITH PARALYSIS   ESOPHAGOGASTRODUODENOSCOPY (EGD) WITH PROPOFOL N/A 06/14/2017   Procedure: ESOPHAGOGASTRODUODENOSCOPY (EGD) WITH PROPOFOL;  Surgeon: Rogene Houston, MD;  [facility];  Service: Endoscopy;  Laterality: N/A;   GIVENS CAPSULE STUDY  06/14/2017   Procedure: GIVENS CAPSULE STUDY;  Surgeon: Rogene Houston, MD;  [facility];  Service: Endoscopy;;   LEFT HEART CATHETERIZATION WITH CORONARY ANGIOGRAM N/A 10/30/2013   Procedure: LEFT HEART CATHETERIZATION WITH CORONARY ANGIOGRAM;  Surgeon: Burnell Blanks, MD;  Location: [facility];  Service: Cardiovascular;  Laterality: N/A;   RIGHT/LEFT HEART CATH AND CORONARY ANGIOGRAPHY N/A 05/03/2017   Procedure: RIGHT/LEFT HEART CATH AND CORONARY ANGIOGRAPHY;  Surgeon: Leonie Man, MD;  Location: [place] [facility];  Service: Cardiovascular;  Laterality: N/A;   TEE WITHOUT CARDIOVERSION N/A 02/04/2017   Procedure: TRANSESOPHAGEAL ECHOCARDIOGRAM (TEE) WITH PROPOL;  Surgeon: Arnoldo Lenis, MD;  [facility];  Service: Endoscopy;  Laterality: N/A;   TONSILLECTOMY     TOTAL KNEE ARTHROPLASTY     TRANSURETHRAL RESECTION OF BLADDER TUMOR N/A 05/14/2016   Procedure: TRANSURETHRAL RESECTION OF BLADDER TUMOR (TURBT);  Surgeon: Raynelle Bring, MD;  Location:  [facility];  Service: Urology;  Laterality: N/A;  GENERAL ANESTHESIA WITH PARALYSIS   TRANSURETHRAL RESECTION OF BLADDER TUMOR N/A 11/07/2020   Procedure: TRANSURETHRAL RESECTION OF BLADDER TUMOR (TURBT);  Surgeon: Cleon Gustin, MD;  [facility];  Service: Urology;  Laterality: N/A;   YAG LASER APPLICATION Bilateral 50/05/3974   Procedure: YAG LASER APPLICATION;  Surgeon: Williams Che, MD;  [facility];  Service: Ophthalmology;  Laterality: Bilateral;    Home Medications:  Allergies as of 11/17/2021       Reactions   Chlor-trimeton [chlorpheniramine] Shortness Of Breath   Carvedilol Itching, Rash   Facial rash/itching   Demerol Rash   Penicillins Rash, Other (See Comments)   REACTION: rash, years ago Has patient had a PCN reaction causing immediate rash, facial/tongue/throat swelling, SOB or lightheadedness with hypotension: Yes Has patient had a PCN reaction causing severe rash involving mucus membranes or skin necrosis: No Has patient had a PCN reaction that required hospitalization No Has patient had a PCN reaction occurring within the last 10 years: No If all of the above answers are "NO", then may proceed with Cephalosporin use.        Medication List        Accurate as of November 17, 2021 11:52 AM. If you have any questions, ask your nurse or doctor.          STOP taking these medications    mirabegron ER 25 MG Tb24 tablet Commonly known as: MYRBETRIQ       TAKE these medications    acetaminophen 500 MG tablet Commonly known as: TYLENOL Take 1,000 mg by mouth daily.   atorvastatin 20 MG tablet Commonly known as: LIPITOR TAKE 1 TABLET BY MOUTH AT BEDTIME   balsalazide 750 MG capsule Commonly known as: COLAZAL TAKE THREE CAPSULES BY MOUTH THREE TIMES DAILY   bisoprolol 5 MG tablet Commonly known as: ZEBETA TAKE 1/2 TABLET BY MOUTH DAILY   cephALEXin 500 MG capsule Commonly known as: Keflex Take 1 capsule (500 mg total) by mouth once for 1 dose.   CRANBERRY PO Take 4,200 mg by mouth daily. With vit C   dofetilide 250 MCG capsule Commonly known as: TIKOSYN TAKE ONE CAPSULE BY MOUTH TWICE DAILY   Entresto 24-26 MG Generic drug: sacubitril-valsartan TAKE 1 TABLET BY MOUTH TWICE DAILY - start ENTRESTO 48 HOURS AFTER STOPPING LISINOPRIL   furosemide 20 MG tablet Commonly known as: LASIX TAKE 2 TABLETS BY MOUTH  TWICE DAILY   ICAPS AREDS 2 PO Take 2 tablets by mouth daily.   meclizine 25 MG tablet Commonly known as: ANTIVERT Take 25 mg by mouth 3 (three) times daily as needed.   Metamucil Smooth Texture 58.6 % powder Generic drug: psyllium Take 1 packet by mouth at bedtime.   nitroGLYCERIN 0.4 MG SL tablet Commonly known as: NITROSTAT Place 0.4 mg under the tongue every 5 (five) minutes as needed for chest pain.   OVER THE COUNTER MEDICATION Vit D 3 25 mcg (1,000units) one tablet bid.   potassium chloride SA 20 MEQ tablet Commonly known as: KLOR-CON M TAKE 1 TABLET BY MOUTH TWICE DAILY   sodium chloride 2 % ophthalmic solution Commonly known as: MURO 128 Place 1 drop into both eyes in the morning and at bedtime.   spironolactone 25 MG tablet Commonly known as: ALDACTONE TAKE 1/2 TABLET BY MOUTH EVERY DAY   Theratears 0.25 % Soln Generic drug: Carboxymethylcellulose Sodium Place 1 drop into both eyes in the morning  and at bedtime.   Xarelto 20 MG Tabs tablet Generic drug: rivaroxaban TAKE 1 TABLET BY MOUTH DAILY WITH SUPPER        Allergies:  Allergies  Allergen Reactions   Chlor-Trimeton [Chlorpheniramine] Shortness Of Breath   Carvedilol Itching and Rash    Facial rash/itching   Demerol Rash   Penicillins Rash and Other (See Comments)    REACTION: rash, years ago Has patient had a PCN reaction causing immediate rash, facial/tongue/throat swelling, SOB or lightheadedness with hypotension: Yes Has patient had a PCN reaction causing severe rash involving mucus membranes or skin necrosis: No Has patient had a PCN reaction that required hospitalization No Has patient had a PCN reaction occurring within the last 10 years: No If all of the above answers are "NO", then may proceed with Cephalosporin use.     Family History: Family History  Problem Relation Age of Onset   CAD Father    Diabetes Father    Heart attack Father     Social History:  reports that she  has never smoked. She has been exposed to tobacco smoke. She has never used smokeless tobacco. She reports that she does not drink alcohol and does not use drugs.  ROS: All other review of systems were reviewed and are negative except what is noted above in HPI  Physical Exam: BP 117/71   Pulse 62   Constitutional:  Alert and oriented, No acute distress. HEENT: [place] AT, moist mucus membranes.  Trachea midline, no masses. Cardiovascular: No clubbing, cyanosis, or edema. Respiratory: Normal respiratory effort, no increased work of breathing. GI: Abdomen is soft, nontender, nondistended, no abdominal masses GU: No CVA tenderness.  Lymph: No cervical or inguinal lymphadenopathy. Skin: No rashes, bruises or suspicious lesions. Neurologic: Grossly intact, no focal deficits, moving all 4 extremities. Psychiatric: Normal mood and affect.  Laboratory Data: Lab Results  Component Value Date   WBC 8.6 08/02/2021   HGB 13.5 08/02/2021   HCT 39.9 08/02/2021   MCV 90 08/02/2021   PLT 329 08/02/2021    Lab Results  Component Value Date   CREATININE 0.94 08/02/2021    No results found for: "PSA"  No results found for: "TESTOSTERONE"  No results found for: "HGBA1C"  Urinalysis    Component Value Date/Time   COLORURINE STRAW (A) 06/08/2019 1116   APPEARANCEUR Hazy (A) 10/25/2021 1459   LABSPEC 1.005 06/08/2019 1116   PHURINE 7.0 06/08/2019 1116   GLUCOSEU Negative 10/25/2021 1459   HGBUR NEGATIVE 06/08/2019 1116   BILIRUBINUR Negative 10/25/2021 1459   KETONESUR NEGATIVE 06/08/2019 1116   PROTEINUR Negative 10/25/2021 1459   PROTEINUR NEGATIVE 06/08/2019 1116   NITRITE Negative 10/25/2021 1459   NITRITE NEGATIVE 06/08/2019 1116   LEUKOCYTESUR Trace (A) 10/25/2021 1459   LEUKOCYTESUR NEGATIVE 06/08/2019 1116    Lab Results  Component Value Date   LABMICR See below: 10/25/2021   WBCUA 0-5 10/25/2021   LABEPIT 0-10 10/25/2021   BACTERIA Moderate (A) 10/25/2021    Pertinent  Imaging: *** Results for orders placed during the hospital encounter of 09/29/05  DG Abd 1 View  Narrative Clinical data:   Nausea, weakness.  PICC line. CHEST - 1 VIEW: Comparison:  09/06/05. Findings:  Mild cardiomegaly.  No pulmonary vascular congestion or active lung process.  PICC line tip is in the superior vena cava, estimated to be approximately 4.3 cm above the superior vena cava/right atrial junction. IMPRESSION: Mild cardiomegaly.  No acute chest findings. ABDOMEN - 2 VIEW: Findings:  Mild gaseous distention of colon in the hepatic flexure region.  Minimal small bowel distention.  No extraluminal gas. IMPRESSION: Findings compatible with mild nonspecific ileus.  Provider: Barbarann Ehlers  No results found for this or any previous visit.  No results found for this or any previous visit.  No results found for this or any previous visit.  Results for orders placed during the hospital encounter of 10/08/06  US Renal  Narrative Clinical Data: Urinary frequency. RENAL/URINARY TRACT ULTRASOUND: Technique: Complete ultrasound examination of the urinary tract was performed including evaluation of the kidneys, renal collecting systems, and urinary bladder. Comparison: CT scan 12/14/04. Findings: The right kidney measures 12.3 cm in length and the left kidney measures 13.0 cm in length. No mass or hydronephrosis. There may be small stones in the lower pole of each kidney with one on the right measuring approximately 0.8 cm and on the left measuring approximately 0.7 cm. Incidental imaging of the urinary bladder is unremarkable. There is a hypoechoic focus in the region of the right adnexa measuring approximately 5.2 x 3.5 cm. This cannot be definitively characterized.  Impression Possible small bilateral renal stones without hydronephrosis. Possible right ovarian enlargement. Pelvic ultrasound could be used for further evaluation.  Provider: Mertie Clause  No valid procedures  specified. No results found for this or any previous visit.  No results found for this or any previous visit.   Assessment & Plan:    1. Malignant neoplasm of overlapping sites of bladder (HCC) *** - Urinalysis, Routine w reflex microscopic   Return in about 1 year (around 11/18/2022) for cystoscopy.  Nicolette Bang, MD  [facility]     11/17/21  CC: No chief complaint on file.   HPI:  Blood pressure 117/71, pulse 62. NED. A&Ox3.   No respiratory distress   Abd soft, NT, ND Normal external genitalia with patent urethral meatus  Cystoscopy Procedure Note  Patient identification was confirmed, informed consent was obtained, and patient was prepped using Betadine solution.  Lidocaine jelly was administered per urethral meatus.    Procedure: - Flexible cystoscope introduced, without any difficulty.   - Thorough search of the bladder revealed:    normal urethral meatus    normal urothelium    no stones    no ulcers     no tumors    no urethral polyps    no trabeculation  - Ureteral orifices were normal in position and appearance.  Post-Procedure: - Patient tolerated the procedure well  Assessment/ Plan:    Return in about 1 year (around 11/18/2022) for cystoscopy.  Nicolette Bang, MD

## 2021-11-23 ENCOUNTER — Encounter: Payer: Self-pay | Admitting: Urology

## 2021-11-23 NOTE — Patient Instructions (Signed)

## 2021-11-24 ENCOUNTER — Encounter: Payer: Self-pay | Admitting: Physician Assistant

## 2021-11-24 ENCOUNTER — Ambulatory Visit: Payer: Medicare Other | Attending: Physician Assistant | Admitting: Cardiology

## 2021-11-24 VITALS — BP 120/62 | HR 61 | Ht 63.0 in | Wt 215.8 lb

## 2021-11-24 DIAGNOSIS — Z950 Presence of cardiac pacemaker: Secondary | ICD-10-CM | POA: Diagnosis not present

## 2021-11-24 DIAGNOSIS — I480 Paroxysmal atrial fibrillation: Secondary | ICD-10-CM

## 2021-11-24 DIAGNOSIS — I5042 Chronic combined systolic (congestive) and diastolic (congestive) heart failure: Secondary | ICD-10-CM | POA: Diagnosis not present

## 2021-11-24 DIAGNOSIS — D6869 Other thrombophilia: Secondary | ICD-10-CM

## 2021-11-24 DIAGNOSIS — Z79899 Other long term (current) drug therapy: Secondary | ICD-10-CM | POA: Diagnosis not present

## 2021-11-24 LAB — CUP PACEART INCLINIC DEVICE CHECK
Battery Remaining Longevity: 57 mo
Battery Voltage: 2.98 V
Brady Statistic RA Percent Paced: 62 %
Brady Statistic RV Percent Paced: 95 %
Date Time Interrogation Session: 20231117150946
Implantable Lead Connection Status: 753985
Implantable Lead Connection Status: 753985
Implantable Lead Connection Status: 753985
Implantable Lead Implant Date: 20220223
Implantable Lead Implant Date: 20220223
Implantable Lead Implant Date: 20220223
Implantable Lead Location: 753858
Implantable Lead Location: 753859
Implantable Lead Location: 753860
Implantable Pulse Generator Implant Date: 20220223
Lead Channel Impedance Value: 537.5 Ohm
Lead Channel Impedance Value: 637.5 Ohm
Lead Channel Impedance Value: 725 Ohm
Lead Channel Pacing Threshold Amplitude: 0.5 V
Lead Channel Pacing Threshold Amplitude: 0.5 V
Lead Channel Pacing Threshold Amplitude: 1.25 V
Lead Channel Pacing Threshold Amplitude: 1.25 V
Lead Channel Pacing Threshold Amplitude: 1.25 V
Lead Channel Pacing Threshold Amplitude: 1.25 V
Lead Channel Pacing Threshold Pulse Width: 0.5 ms
Lead Channel Pacing Threshold Pulse Width: 0.5 ms
Lead Channel Pacing Threshold Pulse Width: 0.5 ms
Lead Channel Pacing Threshold Pulse Width: 0.5 ms
Lead Channel Pacing Threshold Pulse Width: 0.8 ms
Lead Channel Pacing Threshold Pulse Width: 0.8 ms
Lead Channel Sensing Intrinsic Amplitude: 4.1 mV
Lead Channel Sensing Intrinsic Amplitude: 8.8 mV
Lead Channel Setting Pacing Amplitude: 2.25 V
Lead Channel Setting Pacing Amplitude: 2.5 V
Lead Channel Setting Pacing Amplitude: 2.5 V
Lead Channel Setting Pacing Pulse Width: 0.5 ms
Lead Channel Setting Pacing Pulse Width: 0.8 ms
Lead Channel Setting Sensing Sensitivity: 2 mV
Pulse Gen Model: 3562
Pulse Gen Serial Number: 3861599

## 2021-11-24 NOTE — Progress Notes (Signed)
Cardiology Office Note Date:  11/24/2021  Patient ID:  [name], [name] [birth year], MRN [id number] PCP:  [place], MD  Cardiologist:  Dr. Domenic Polite Electrophysiologist: Dr. Curt Bears   Chief Complaint:  84mo History of Present Illness: [name] SUDERis a 88y.o. female with history of CAD (remote PCI to RCA), NICM, chronic CHF (systolic), Afib  She comes in today to be seen for Dr. CCurt Bears last seen by him May 2022, she had some symptom improvement post CRT implant, still getting fatigued later in the day, overall with more energy.  AFib burden was low, no changes were made.  More recently saw Dr. MDomenic Polite2/3/23, also doing pretty well, described class III-III symptoms with plans to update her echo post CRT  LVEF up some to 40%, global hypokinesis  [name] saw her 04/13/21 She is accompanied by her daughter today She reports that generally she doesn't feel like she has much energy. Some days are better/worse then others. On her good days she is able to do her ADLs, and thinks perhaps may over do it and then is more tired/weak for a few days afterwards. Though does wish she had better stamina overall No rest SOB + DOE with heavier household chores, like moving furniture to clean Sometimes when cooking she gets weak and has to sit down, she does not feel lightheaded or like fainting, but her legs feel weak. No near syncope or syncope. No bleeding or signs of bleeding  QRS was variable, 162-2051m her EF was improved to 40% and she was feeling better. Ultimately I decided not to pursue EKG optimization given clinically doing better. She did mention some days not feeling as well, ? If this may be AF Her burden was 6% and QT stable  [name] saw her 08/02/21 She is accompanied by her daughter She wishes she had more stamina. Sates she has great energy in the AMs, but by mid-day is worn out.  She is able to get cleaning, cooking, laundry done in the mornings, her daughter  suggests perhaps does a little too much perhaps. She has had a couple "spells" Once with fast HRs, broke out in a sweat, lasted a couple minutes The others more of a weak feeling, not lightheaded, but like her legs feel weak, has to rest. No near syncope or syncope. Once a CP that was sharp, central, and brief. No bleeding  With industry we did look at optimization options Tried quickOpt recommendations without improvement in her EKG Tried other programming vectors as well, though made no impact on her EKG That being said, the patient requested that we make no changes anyway, even if we did think we got an improved EKG She has gotten clinical improvement and did not want to risk making any changes. Symptoms did not seem to line up with her Afib episodes, advised she keep an eye on her BP QTc was OK in light of her QRS duration AF burden 10% Not grossly volume OL  [facility] AV paced EKG, CT head was without acute findings Ultimately discharged from the ER Perhaps given phenergan Labs were ok Discussed cardiology consult, not sure it got done Diagnosed with vertigo, has been taking PRN meclizine since this with improvement in symptoms.   Saw the AFib clinic 10/04/21 with an increase in AF burden 31% with associated increase in fatigue.  Described dizziness as chronic. Noting prior failure of amiodarone without further AAD options, Tikosyn continued, offered  to increase her bisoprolol, though she preferred no changes.   She presents today with very minimal new complaints. Continues to feel fatigue and 'wiped out" in afternoons. Not changed or worsened since prior. Is acceptable to her.  She does have some BLL edema, takes 16m lasix BID for this, not new or worsening.  She continues to take tikosyn BID, is very diligent about taking meds, uses an alarm on her phone. Continues to take xarelto without concerning bleeding.    Device information [employer]  CRT-P implanted 03/02/20  AAD hx Amiodarone stopped 2019 with failure to maintain SR Tikosyn started 2019 is current   Past Medical History:  Diagnosis Date   Arthritis    Atrial fibrillation ([facility]    Atrial flutter ([facility]    Bladder cancer ([facility]    Cardiomyopathy ([facility]    Coronary atherosclerosis of native coronary artery    a. BMS RCA May 2014 - [place]stent. b. Cath 04/2017 - patent stent, minimal CAD otherwise.   Depression    Essential hypertension    GI bleeding 06/2017   a. melena/small bowel ulcer by capsule endo 06/2017.   Hyperlipemia    Macular degeneration    NICM (nonischemic cardiomyopathy) (HCC)    Persistent atrial fibrillation (HCC)    Presence of permanent cardiac pacemaker    Ulcerative colitis     Past Surgical History:  Procedure Laterality Date   ABDOMINAL HYSTERECTOMY     APPENDECTOMY     Bilateral knee replacements      2007, 2008   BIV PACEMAKER INSERTION CRT-P N/A 03/02/2020   Procedure: BIV PACEMAKER INSERTION CRT-P;  Surgeon: CConstance Haw MD;  [facility];  Service: Cardiovascular;  Laterality: N/A;   BLADDER SURGERY     CARDIOVERSION N/A 02/04/2017   Procedure: CARDIOVERSION;  Surgeon: BArnoldo Lenis MD;  [facility];  Service: Endoscopy;  Laterality: N/A;   CARDIOVERSION N/A 02/26/2017   Procedure: CARDIOVERSION;  Surgeon: MSatira Sark MD;  Location: [facility] OR[facility];  Service: Cardiovascular;  Laterality: N/A;   CARDIOVERSION N/A 08/07/2017   Procedure: CARDIOVERSION;  Surgeon: HPixie Casino MD;  [place]  Service: Cardiovascular;  Laterality: N/A;   COLONOSCOPY N/A 06/15/2015   Procedure: COLONOSCOPY;  Surgeon: NRogene Houston MD;  Location: [facility];  Service: Endoscopy;  Laterality: N/A;  210   CYSTOSCOPY N/A 11/07/2020   Procedure: CYSTOSCOPY;  Surgeon: MCleon Gustin MD;  Location: [facility] OR[facility];  Service: Urology;  Laterality: N/A;   CYSTOSCOPY W/ RETROGRADES Bilateral 05/14/2016    Procedure: CYSTOSCOPY WITH BILATERAL RETROGRADE PYELOGRAM;  Surgeon: BRaynelle Bring MD;  Location: [facility];  Service: Urology;  Laterality: Bilateral;  GENERAL ANESTHESIA WITH PARALYSIS   ESOPHAGOGASTRODUODENOSCOPY (EGD) WITH PROPOFOL N/A 06/14/2017   Procedure: ESOPHAGOGASTRODUODENOSCOPY (EGD) WITH PROPOFOL;  Surgeon: RRogene Houston MD;  Location[facility];  Service: Endoscopy;  Laterality: N/A;   GIVENS CAPSULE STUDY  06/14/2017   Procedure: GIVENS CAPSULE STUDY;  Surgeon: RRogene Houston MD;  [facility];  Service: Endoscopy;;   LEFT HEART CATHETERIZATION WITH CORONARY ANGIOGRAM N/A 10/30/2013   Procedure: LEFT HEART CATHETERIZATION WITH CORONARY ANGIOGRAM;  Surgeon: CBurnell Blanks MD;  [facility];  Service: Cardiovascular;  Laterality: N/A;   RIGHT/LEFT HEART CATH AND CORONARY ANGIOGRAPHY N/A 05/03/2017   Procedure: RIGHT/LEFT HEART CATH AND CORONARY ANGIOGRAPHY;  Surgeon: HLeonie Man MD;  [facility];  Service: Cardiovascular;  Laterality: N/A;  TEE WITHOUT CARDIOVERSION N/A 02/04/2017   Procedure: TRANSESOPHAGEAL ECHOCARDIOGRAM (TEE) WITH PROPOL;  Surgeon: Arnoldo Lenis, MD;  [facility];  Service: Endoscopy;  Laterality: N/A;   TONSILLECTOMY     TOTAL KNEE ARTHROPLASTY     TRANSURETHRAL RESECTION OF BLADDER TUMOR N/A 05/14/2016   Procedure: TRANSURETHRAL RESECTION OF BLADDER TUMOR (TURBT);  Surgeon: Raynelle Bring, MD;  Location: [facility];  Service: Urology;  Laterality: N/A;  GENERAL ANESTHESIA WITH PARALYSIS   TRANSURETHRAL RESECTION OF BLADDER TUMOR N/A 11/07/2020   Procedure: TRANSURETHRAL RESECTION OF BLADDER TUMOR (TURBT);  Surgeon: Cleon Gustin, MD;  Location: [facility];  Service: Urology;  Laterality: N/A;   YAG LASER APPLICATION Bilateral 85/02/7780   Procedure: YAG LASER APPLICATION;  Surgeon: Williams Che, MD;  Location: [facility];  Service: Ophthalmology;  Laterality: Bilateral;    Current  Outpatient Medications  Medication Sig Dispense Refill   acetaminophen (TYLENOL) 500 MG tablet Take 1,000 mg by mouth daily.     atorvastatin (LIPITOR) 20 MG tablet TAKE 1 TABLET BY MOUTH AT BEDTIME 90 tablet 0   balsalazide (COLAZAL) 750 MG capsule TAKE THREE CAPSULES BY MOUTH THREE TIMES DAILY 270 capsule 11   bisoprolol (ZEBETA) 5 MG tablet TAKE 1/2 TABLET BY MOUTH DAILY 45 tablet 1   Carboxymethylcellulose Sodium (THERATEARS) 0.25 % SOLN Place 1 drop into both eyes in the morning and at bedtime.     CRANBERRY PO Take 4,200 mg by mouth daily. With vit C     dofetilide (TIKOSYN) 250 MCG capsule TAKE ONE CAPSULE BY MOUTH TWICE DAILY 60 capsule 6   ENTRESTO 24-26 MG TAKE 1 TABLET BY MOUTH TWICE DAILY - start ENTRESTO 48 HOURS AFTER STOPPING LISINOPRIL 60 tablet 6   furosemide (LASIX) 20 MG tablet TAKE 2 TABLETS BY MOUTH TWICE DAILY 360 tablet 1   meclizine (ANTIVERT) 25 MG tablet Take 25 mg by mouth 3 (three) times daily as needed.     mirabegron ER (MYRBETRIQ) 25 MG TB24 tablet Take 1 tablet (25 mg total) by mouth daily. 30 tablet 11   Multiple Vitamins-Minerals (ICAPS AREDS 2 PO) Take 2 tablets by mouth daily.     nitroGLYCERIN (NITROSTAT) 0.4 MG SL tablet Place 0.4 mg under the tongue every 5 (five) minutes as needed for chest pain.     OVER THE COUNTER MEDICATION Vit D 3 25 mcg (1,000units) one tablet bid.     potassium chloride SA (KLOR-CON M) 20 MEQ tablet TAKE 1 TABLET BY MOUTH TWICE DAILY 180 tablet 2   psyllium (METAMUCIL SMOOTH TEXTURE) 58.6 % powder Take 1 packet by mouth at bedtime. 283 g 12   rivaroxaban (XARELTO) 20 MG TABS tablet TAKE 1 TABLET BY MOUTH DAILY WITH SUPPER 90 tablet 1   sodium chloride (MURO 128) 2 % ophthalmic solution Place 1 drop into both eyes in the morning and at bedtime.     spironolactone (ALDACTONE) 25 MG tablet TAKE 1/2 TABLET BY MOUTH EVERY DAY 45 tablet 0   No current facility-administered medications for this visit.    Allergies:   Chlor-trimeton  [chlorpheniramine], Carvedilol, Demerol, and Penicillins   Social History:  The patient  reports that she has never smoked. She has been exposed to tobacco smoke. She has never used smokeless tobacco. She reports that she does not drink alcohol and does not use drugs.   Family History:  The patient's family history includes CAD in her father; Diabetes in her father; Heart attack in her father.  ROS:  Please see the history of present illness.    All other systems are reviewed and otherwise negative.   PHYSICAL EXAM:  VS:  BP 120/62   Pulse 61   Ht 5' 3"  (1.6 m)   Wt 215 lb 12.8 oz (97.9 kg)   SpO2 95%   BMI 38.23 kg/m  BMI: Body mass index is 38.23 kg/m. Well nourished, well developed, in no acute distress HEENT: normocephalic, atraumatic Neck: no JVD, carotid bruits or masses Cardiac:  RRR; no significant murmurs, no rubs, or gallops Lungs:  CTA b/l, no wheezing, rhonchi or rales Abd: soft, nontender MS: no deformity or atrophy Ext: 2+ edema BLL up to knee Skin: warm and dry, no rash Neuro:  No gross deficits appreciated Psych: euthymic mood, full affect  PPM site is stable, no tethering or discomfort   EKG: done today and reviewed by myself AV paced; wide, stable QRS at 153m QTC 428 with LBBB correction  Device interrogation done today and reviewed by myself:  Battery and lead measurements stable RA, LV outputs increased for safety margin 17% AF/AT burden No high ven rate episodes 95% BiV pacing CorVue stable  03/14/2021: TTE IMPRESSIONS   1. Left ventricular ejection fraction, by estimation, is 40%. The left  ventricle has low normal function. The left ventricle demonstrates global hypokinesis. There is mild left ventricular hypertrophy. Left ventricular diastolic parameters are indeterminate.   2. RV not well visualized. Grossly appears normal in size and function. Right ventricular systolic function was not well visualized. The right ventricular size is not well  visualized.   3. The mitral valve was not well visualized. No evidence of mitral valve regurgitation. No evidence of mitral stenosis.   4. The aortic valve is tricuspid. Aortic valve regurgitation is not  visualized. No aortic stenosis is present.   5. The inferior vena cava is normal in size with greater than 50%  respiratory variability, suggesting right atrial pressure of 3 mmHg.   Comparison(s): LVEF 30-35%.    03/25/2019: TTE IMPRESSIONS   1. Endocardium poorly visualized, difficult to assess LV function.  Recommend limited contrast study to better evaluate LVEF, 30-35% is rough estimate. Left ventricular ejection fraction, by estimation, is 30-35%. The left ventricle has moderate to severely decreased function. The left ventricle demonstrates global hypokinesis. Left ventricular diastolic parameters are consistent with Grade I diastolic dysfunction (impaired relaxation).   2. Right ventricular systolic function is normal. The right ventricular size is normal. There is normal pulmonary artery systolic pressure.   3. Left atrial size was severely dilated.   4. Right atrial size was mildly dilated.   5. The mitral valve is normal in structure. No evidence of mitral valve regurgitation. No evidence of mitral stenosis.   6. The aortic valve is tricuspid. Aortic valve regurgitation is not visualized. No aortic stenosis is present.   7. The inferior vena cava is normal in size with greater than 50% respiratory variability, suggesting right atrial pressure of 3 mmHg.   TTE 11/06/18 Review of the above records today demonstrates:   1. Left ventricular ejection fraction, by visual estimation, is 20 to 25%. The left ventricle has severely decreased function. There is mildly increased left ventricular hypertrophy. There is diffuse hypokinesis.  2. Abnormal septal motion consistent with left bundle branch block.  3. Left ventricular diastolic parameters are consistent with Grade I diastolic  dysfunction (impaired relaxation).  4. Global right ventricle has normal systolic function.The right ventricular size is normal. No increase in right ventricular  wall thickness.  5. Left atrial size was normal.  6. Right atrial size was normal.  7. Mild to moderate mitral annular calcification.  8. Moderate aortic valve annular calcification.  9. The mitral valve is grossly normal. Mild mitral valve regurgitation. 10. The tricuspid valve is grossly normal. Tricuspid valve regurgitation is trivial. 11. The aortic valve is tricuspid. Aortic valve regurgitation is not visualized. 12. The pulmonic valve was grossly normal. Pulmonic valve regurgitation is mild. 13. The inferior vena cava is normal in size with greater than 50% respiratory variability, suggesting right atrial pressure of 3 mmHg. 14. TR signal is inadequate for assessing pulmonary artery systolic pressure.   LHC 05/03/17 Hemodynamic findings consistent with mild secondary pulmonary hypertension. Patient has severe nonischemic cardiomyopathy Moderate to Severely reduced CO/CI LV end diastolic pressure is moderately elevated. Mid RCA stent widely patent There is mild (2+) mitral regurgitation.   Zio 09/16/18  Max 197 bpm 09:12am, 08/24 Min 38 bpm 03:09am, 08/24 Avg 68 bpm Rare PVCs Frequent PACs Atrial fibrillation: None Multiple runs of SVT, longest 11 seconds, fastest rate 197 for 8 beats   Recent Labs: 08/02/2021: BUN 20; Creatinine, Ser 0.94; Hemoglobin 13.5; Magnesium 2.4; Platelets 329; Potassium 4.8; Sodium 133  No results found for requested labs within last 365 days.   CrCl cannot be calculated (Patient's most recent lab result is older than the maximum 21 days allowed.).   Wt Readings from Last 3 Encounters:  11/24/21 215 lb 12.8 oz (97.9 kg)  10/25/21 216 lb (98 kg)  10/04/21 215 lb 3.2 oz (97.6 kg)     Other studies reviewed: Additional studies/records reviewed today include: summarized above  ASSESSMENT  AND PLAN:  Paroxysmal AFib CHA2DS2Vasc is 6 (CHF, HTN, Vasc dz, age, gender),  AC - xarelto appropriately dosed; Updated labs today to confirm dose remains appropriate 17 % burden on today's interrogation; down from 31% QTc is stable given QRS duration Excellent patient compliance Meds reviewed, no contraindicated meds w/Tikosyn noted   CRT-P Intact function Programming as above  NICM Chronic CHF (systolic) On BB, entresto, lasix, aldactone Stable BLL edema, no concerning s/s of volume overload Stable CorVue In review, despite programming attempts no particular improvement in EKG pt asked to make no programming changes regardless   Disposition: 30month with Dr. CCurt Bearsor EP APP Patient requests to tx care to EP in [place](Dr. MMyles Gip given transportation difficulties   Current medicines are reviewed at length with the patient today.  The patient did not have any concerns regarding medicines.  Signed, SMamie Levers NP  [place] CourtSBorrego SpringsGGreendale[place] [phone])  [phone](fax)

## 2021-11-24 NOTE — Patient Instructions (Signed)
Medication Instructions:   Your physician recommends that you continue on your current medications as directed. Please refer to the Current Medication list given to you today.   *If you need a refill on your cardiac medications before your next appointment, please call your pharmacy*  Lab Work: BMET [place]   If you have labs (blood work) drawn today and your tests are completely normal, you will receive your results only by: [place] (if you have MyChart) OR A paper copy in the mail If you have any lab test that is abnormal or we need to change your treatment, we will call you to review the results.   Testing/Procedures: NONE ORDERED  TODAY     Follow-Up: At [facility], you and your health needs are our priority.  As part of our continuing mission to provide you with exceptional heart care, we have created designated Provider Care Teams.  These Care Teams include your primary Cardiologist (physician) and Advanced Practice Providers (APPs -  Physician Assistants and Nurse Practitioners) who all work together to provide you with the care you need, when you need it.  We recommend signing up for the patient portal called "MyChart".  Sign up information is provided on this After Visit Summary.  MyChart is used to connect with patients for Virtual Visits (Telemedicine).  Patients are able to view lab/test results, encounter notes, upcoming appointments, etc.  Non-urgent messages can be sent to your provider as well.   To learn more about what you can do with MyChart, go to [email or web address].    Your next appointment:    4 month(s)  The format for your next appointment:   In Person  Provider:   Allegra Lai, MD   Other Instructions   Important Information About Sugar

## 2021-11-25 LAB — BASIC METABOLIC PANEL
BUN/Creatinine Ratio: 20 (ref 12–28)
BUN: 17 mg/dL (ref 8–27)
CO2: 24 mmol/L (ref 20–29)
Calcium: 10 mg/dL (ref 8.7–10.3)
Chloride: 97 mmol/L (ref 96–106)
Creatinine, Ser: 0.83 mg/dL (ref 0.57–1.00)
Glucose: 91 mg/dL (ref 70–99)
Potassium: 4.5 mmol/L (ref 3.5–5.2)
Sodium: 139 mmol/L (ref 134–144)
eGFR: 67 mL/min/{1.73_m2} (ref >59)

## 2021-11-25 LAB — CBC
Hematocrit: 40.7 % (ref 34.0–46.6)
Hemoglobin: 13.9 g/dL (ref 11.1–15.9)
MCH: 30.9 pg (ref 26.6–33.0)
MCHC: 34.2 g/dL (ref 31.5–35.7)
MCV: 90 fL (ref 79–97)
Platelets: 313 10*3/uL (ref 150–450)
RBC: 4.5 x10E6/uL (ref 3.77–5.28)
RDW: 13.7 % (ref 11.7–15.4)
WBC: 10.9 10*3/uL — ABNORMAL HIGH (ref 3.4–10.8)

## 2021-11-25 LAB — MAGNESIUM: Magnesium: 2.4 mg/dL — ABNORMAL HIGH (ref 1.6–2.3)

## 2021-11-27 DIAGNOSIS — H18452 Nodular corneal degeneration, left eye: Secondary | ICD-10-CM | POA: Diagnosis not present

## 2021-11-27 DIAGNOSIS — H18451 Nodular corneal degeneration, right eye: Secondary | ICD-10-CM | POA: Diagnosis not present

## 2021-12-04 DIAGNOSIS — E559 Vitamin D deficiency, unspecified: Secondary | ICD-10-CM | POA: Diagnosis not present

## 2021-12-04 DIAGNOSIS — R739 Hyperglycemia, unspecified: Secondary | ICD-10-CM | POA: Diagnosis not present

## 2021-12-04 DIAGNOSIS — I10 Essential (primary) hypertension: Secondary | ICD-10-CM | POA: Diagnosis not present

## 2021-12-04 DIAGNOSIS — E7801 Familial hypercholesterolemia: Secondary | ICD-10-CM | POA: Diagnosis not present

## 2021-12-05 ENCOUNTER — Ambulatory Visit (INDEPENDENT_AMBULATORY_CARE_PROVIDER_SITE_OTHER): Payer: Medicare Other

## 2021-12-05 ENCOUNTER — Telehealth: Payer: Self-pay | Admitting: *Deleted

## 2021-12-05 DIAGNOSIS — I428 Other cardiomyopathies: Secondary | ICD-10-CM

## 2021-12-05 NOTE — Telephone Encounter (Addendum)
-----   Message from Will Meredith Leeds, MD sent at 11/24/2021  3:19 PM EST ----- Regarding: RE: Tx to [place] to switch    ----- Message ----- From: Mamie Levers, NP Sent: 11/24/2021   2:29 PM EST To: Will Meredith Leeds, MD Subject: Tx to [place] and I saw this lovely lady earlier today. She is having difficulty getting to [place] for follow-up appts - has to have a ride from family. She lives in [place] and would like to see [facility] in [place] too, if you're ok with it. Thoughts?  She's got a CRT-P, Afib on Tikosyn, so needs to be seen every couple months.    sr

## 2021-12-06 LAB — CUP PACEART REMOTE DEVICE CHECK
Battery Remaining Longevity: 59 mo
Battery Remaining Percentage: 73 %
Battery Voltage: 2.98 V
Brady Statistic AP VP Percent: 78 %
Brady Statistic AP VS Percent: 1 %
Brady Statistic AS VP Percent: 21 %
Brady Statistic AS VS Percent: 1 %
Brady Statistic RA Percent Paced: 67 %
Date Time Interrogation Session: 20231128020009
Implantable Lead Connection Status: 753985
Implantable Lead Connection Status: 753985
Implantable Lead Connection Status: 753985
Implantable Lead Implant Date: 20220223
Implantable Lead Implant Date: 20220223
Implantable Lead Implant Date: 20220223
Implantable Lead Location: 753858
Implantable Lead Location: 753859
Implantable Lead Location: 753860
Implantable Pulse Generator Implant Date: 20220223
Lead Channel Impedance Value: 510 Ohm
Lead Channel Impedance Value: 590 Ohm
Lead Channel Impedance Value: 690 Ohm
Lead Channel Pacing Threshold Amplitude: 0.5 V
Lead Channel Pacing Threshold Amplitude: 1.25 V
Lead Channel Pacing Threshold Amplitude: 1.25 V
Lead Channel Pacing Threshold Pulse Width: 0.5 ms
Lead Channel Pacing Threshold Pulse Width: 0.5 ms
Lead Channel Pacing Threshold Pulse Width: 0.8 ms
Lead Channel Sensing Intrinsic Amplitude: 11.3 mV
Lead Channel Sensing Intrinsic Amplitude: 3.8 mV
Lead Channel Setting Pacing Amplitude: 2.25 V
Lead Channel Setting Pacing Amplitude: 2.5 V
Lead Channel Setting Pacing Amplitude: 2.5 V
Lead Channel Setting Pacing Pulse Width: 0.5 ms
Lead Channel Setting Pacing Pulse Width: 0.8 ms
Lead Channel Setting Sensing Sensitivity: 2 mV
Pulse Gen Model: 3562
Pulse Gen Serial Number: 3861599

## 2021-12-08 DIAGNOSIS — D6869 Other thrombophilia: Secondary | ICD-10-CM | POA: Diagnosis not present

## 2021-12-08 DIAGNOSIS — I4891 Unspecified atrial fibrillation: Secondary | ICD-10-CM | POA: Diagnosis not present

## 2021-12-08 DIAGNOSIS — Z0001 Encounter for general adult medical examination with abnormal findings: Secondary | ICD-10-CM | POA: Diagnosis not present

## 2021-12-08 DIAGNOSIS — I509 Heart failure, unspecified: Secondary | ICD-10-CM | POA: Diagnosis not present

## 2022-01-03 ENCOUNTER — Other Ambulatory Visit: Payer: Self-pay | Admitting: Cardiology

## 2022-01-03 NOTE — Progress Notes (Signed)
Remote pacemaker transmission.   

## 2022-01-12 DIAGNOSIS — I10 Essential (primary) hypertension: Secondary | ICD-10-CM | POA: Diagnosis not present

## 2022-01-12 DIAGNOSIS — R739 Hyperglycemia, unspecified: Secondary | ICD-10-CM | POA: Diagnosis not present

## 2022-01-22 DIAGNOSIS — M19012 Primary osteoarthritis, left shoulder: Secondary | ICD-10-CM | POA: Diagnosis not present

## 2022-01-22 DIAGNOSIS — Z6838 Body mass index (BMI) 38.0-38.9, adult: Secondary | ICD-10-CM | POA: Diagnosis not present

## 2022-01-22 DIAGNOSIS — K51918 Ulcerative colitis, unspecified with other complication: Secondary | ICD-10-CM | POA: Diagnosis not present

## 2022-01-22 DIAGNOSIS — I482 Chronic atrial fibrillation, unspecified: Secondary | ICD-10-CM | POA: Diagnosis not present

## 2022-01-22 DIAGNOSIS — M19011 Primary osteoarthritis, right shoulder: Secondary | ICD-10-CM | POA: Diagnosis not present

## 2022-02-01 ENCOUNTER — Encounter (INDEPENDENT_AMBULATORY_CARE_PROVIDER_SITE_OTHER): Payer: Self-pay | Admitting: Gastroenterology

## 2022-02-06 DIAGNOSIS — D6869 Other thrombophilia: Secondary | ICD-10-CM | POA: Diagnosis not present

## 2022-02-06 DIAGNOSIS — I10 Essential (primary) hypertension: Secondary | ICD-10-CM | POA: Diagnosis not present

## 2022-02-06 DIAGNOSIS — C679 Malignant neoplasm of bladder, unspecified: Secondary | ICD-10-CM | POA: Diagnosis not present

## 2022-02-06 LAB — LAB REPORT - SCANNED: EGFR: 62.5

## 2022-02-12 DIAGNOSIS — K51918 Ulcerative colitis, unspecified with other complication: Secondary | ICD-10-CM | POA: Diagnosis not present

## 2022-02-12 DIAGNOSIS — M19012 Primary osteoarthritis, left shoulder: Secondary | ICD-10-CM | POA: Diagnosis not present

## 2022-02-12 DIAGNOSIS — N390 Urinary tract infection, site not specified: Secondary | ICD-10-CM | POA: Diagnosis not present

## 2022-02-12 DIAGNOSIS — M19011 Primary osteoarthritis, right shoulder: Secondary | ICD-10-CM | POA: Diagnosis not present

## 2022-02-12 DIAGNOSIS — I482 Chronic atrial fibrillation, unspecified: Secondary | ICD-10-CM | POA: Diagnosis not present

## 2022-02-19 DIAGNOSIS — R7989 Other specified abnormal findings of blood chemistry: Secondary | ICD-10-CM | POA: Diagnosis not present

## 2022-02-21 DIAGNOSIS — D72829 Elevated white blood cell count, unspecified: Secondary | ICD-10-CM | POA: Diagnosis not present

## 2022-02-26 DIAGNOSIS — N281 Cyst of kidney, acquired: Secondary | ICD-10-CM | POA: Diagnosis not present

## 2022-02-26 DIAGNOSIS — R7989 Other specified abnormal findings of blood chemistry: Secondary | ICD-10-CM | POA: Diagnosis not present

## 2022-02-26 DIAGNOSIS — R945 Abnormal results of liver function studies: Secondary | ICD-10-CM | POA: Diagnosis not present

## 2022-02-26 DIAGNOSIS — K8020 Calculus of gallbladder without cholecystitis without obstruction: Secondary | ICD-10-CM | POA: Diagnosis not present

## 2022-03-01 ENCOUNTER — Other Ambulatory Visit: Payer: Self-pay | Admitting: Cardiology

## 2022-03-01 ENCOUNTER — Other Ambulatory Visit (INDEPENDENT_AMBULATORY_CARE_PROVIDER_SITE_OTHER): Payer: Self-pay | Admitting: Gastroenterology

## 2022-03-06 ENCOUNTER — Ambulatory Visit: Payer: Medicare Other

## 2022-03-06 DIAGNOSIS — I428 Other cardiomyopathies: Secondary | ICD-10-CM | POA: Diagnosis not present

## 2022-03-06 LAB — CUP PACEART REMOTE DEVICE CHECK
Battery Remaining Longevity: 55 mo
Battery Remaining Percentage: 69 %
Battery Voltage: 2.98 V
Brady Statistic AP VP Percent: 80 %
Brady Statistic AP VS Percent: 1 %
Brady Statistic AS VP Percent: 19 %
Brady Statistic AS VS Percent: 1 %
Brady Statistic RA Percent Paced: 64 %
Date Time Interrogation Session: 20240227020011
Implantable Lead Connection Status: 753985
Implantable Lead Connection Status: 753985
Implantable Lead Connection Status: 753985
Implantable Lead Implant Date: 20220223
Implantable Lead Implant Date: 20220223
Implantable Lead Implant Date: 20220223
Implantable Lead Location: 753858
Implantable Lead Location: 753859
Implantable Lead Location: 753860
Implantable Pulse Generator Implant Date: 20220223
Lead Channel Impedance Value: 480 Ohm
Lead Channel Impedance Value: 560 Ohm
Lead Channel Impedance Value: 690 Ohm
Lead Channel Pacing Threshold Amplitude: 0.5 V
Lead Channel Pacing Threshold Amplitude: 1.25 V
Lead Channel Pacing Threshold Amplitude: 1.25 V
Lead Channel Pacing Threshold Pulse Width: 0.5 ms
Lead Channel Pacing Threshold Pulse Width: 0.5 ms
Lead Channel Pacing Threshold Pulse Width: 0.8 ms
Lead Channel Sensing Intrinsic Amplitude: 11 mV
Lead Channel Sensing Intrinsic Amplitude: 4.5 mV
Lead Channel Setting Pacing Amplitude: 2.25 V
Lead Channel Setting Pacing Amplitude: 2.5 V
Lead Channel Setting Pacing Amplitude: 2.5 V
Lead Channel Setting Pacing Pulse Width: 0.5 ms
Lead Channel Setting Pacing Pulse Width: 0.8 ms
Lead Channel Setting Sensing Sensitivity: 2 mV
Pulse Gen Model: 3562
Pulse Gen Serial Number: 3861599

## 2022-03-09 ENCOUNTER — Encounter: Payer: Self-pay | Admitting: Cardiovascular Disease

## 2022-03-09 ENCOUNTER — Ambulatory Visit: Payer: Medicare Other | Attending: Cardiology | Admitting: Cardiovascular Disease

## 2022-03-09 VITALS — BP 124/70 | HR 61 | Ht 63.0 in | Wt 211.0 lb

## 2022-03-09 DIAGNOSIS — I480 Paroxysmal atrial fibrillation: Secondary | ICD-10-CM | POA: Diagnosis not present

## 2022-03-09 DIAGNOSIS — I5042 Chronic combined systolic (congestive) and diastolic (congestive) heart failure: Secondary | ICD-10-CM

## 2022-03-09 DIAGNOSIS — Z79899 Other long term (current) drug therapy: Secondary | ICD-10-CM | POA: Diagnosis not present

## 2022-03-09 LAB — CUP PACEART INCLINIC DEVICE CHECK
Battery Remaining Longevity: 52 mo
Battery Voltage: 2.98 V
Brady Statistic RA Percent Paced: 65 %
Brady Statistic RV Percent Paced: 96 %
Date Time Interrogation Session: 20240301160850
Implantable Lead Connection Status: 753985
Implantable Lead Connection Status: 753985
Implantable Lead Connection Status: 753985
Implantable Lead Implant Date: 20220223
Implantable Lead Implant Date: 20220223
Implantable Lead Implant Date: 20220223
Implantable Lead Location: 753858
Implantable Lead Location: 753859
Implantable Lead Location: 753860
Implantable Pulse Generator Implant Date: 20220223
Lead Channel Impedance Value: 512.5 Ohm
Lead Channel Impedance Value: 637.5 Ohm
Lead Channel Impedance Value: 712.5 Ohm
Lead Channel Pacing Threshold Amplitude: 0.5 V
Lead Channel Pacing Threshold Amplitude: 0.5 V
Lead Channel Pacing Threshold Amplitude: 1.25 V
Lead Channel Pacing Threshold Amplitude: 1.25 V
Lead Channel Pacing Threshold Amplitude: 1.25 V
Lead Channel Pacing Threshold Amplitude: 1.25 V
Lead Channel Pacing Threshold Pulse Width: 0.5 ms
Lead Channel Pacing Threshold Pulse Width: 0.5 ms
Lead Channel Pacing Threshold Pulse Width: 0.5 ms
Lead Channel Pacing Threshold Pulse Width: 0.5 ms
Lead Channel Pacing Threshold Pulse Width: 0.8 ms
Lead Channel Pacing Threshold Pulse Width: 0.8 ms
Lead Channel Sensing Intrinsic Amplitude: 4.6 mV
Lead Channel Sensing Intrinsic Amplitude: 9.9 mV
Lead Channel Setting Pacing Amplitude: 2.25 V
Lead Channel Setting Pacing Amplitude: 2.5 V
Lead Channel Setting Pacing Amplitude: 2.5 V
Lead Channel Setting Pacing Pulse Width: 0.5 ms
Lead Channel Setting Pacing Pulse Width: 0.8 ms
Lead Channel Setting Sensing Sensitivity: 2 mV
Pulse Gen Model: 3562
Pulse Gen Serial Number: 3861599

## 2022-03-09 NOTE — Progress Notes (Signed)
Electrophysiology Office Note   Date:  03/09/2022   ID:  [name], [name] 05/02/1930, MRN [id number]  PCP:  [name] William Nation, MD  Cardiologist:  Domenic Polite Primary Electrophysiologist:  Melida Quitter, MD    No chief complaint on file.    History of Present Illness: [name] is a 89 y.o. female who is being seen today for the evaluation of atrial fibrillation at the request of [place] Nation, MD. Presenting today for electrophysiology evaluation.    She has a history of atrial flutter, atrial fibrillation, coronary artery disease status post RCA stent, nonischemic cardiomyopathy.  She has an ejection fraction of 25%.  She has now status post [place] CRT-P implanted 03/02/2020.  She reports that she is doing very well. She does not sense her AF -- no palpitations. She does have episodic fatigue and shortness of breath though this is not necessarily correlated with AF.  she has no device related complaints -- no new tenderness, drainage, redness.    Past Medical History:  Diagnosis Date   Arthritis    Atrial fibrillation [facility])    Atrial flutter ([place])    Bladder cancer ([place])    Cardiomyopathy ([place])    Coronary atherosclerosis of native coronary artery    a. BMS RCA May 2014 - [place] stent. b. Cath 04/2017 - patent stent, minimal CAD otherwise.   Depression    Essential hypertension    GI bleeding 06/2017   a. melena/small bowel ulcer by capsule endo 06/2017.   Hyperlipemia    Macular degeneration    NICM (nonischemic cardiomyopathy) (HCC)    Persistent atrial fibrillation (HCC)    Presence of permanent cardiac pacemaker    Ulcerative colitis    Past Surgical History:  Procedure Laterality Date   ABDOMINAL HYSTERECTOMY     APPENDECTOMY     Bilateral knee replacements      2007, 2008   BIV PACEMAKER INSERTION CRT-P N/A 03/02/2020   Procedure: BIV PACEMAKER INSERTION CRT-P;  Surgeon: Constance Haw, MD;  Location: [place] [facility];  Service:  Cardiovascular;  Laterality: N/A;   BLADDER SURGERY     CARDIOVERSION N/A 02/04/2017   Procedure: CARDIOVERSION;  Surgeon: Arnoldo Lenis, MD;  [facility];  Service: Endoscopy;  Laterality: N/A;   CARDIOVERSION N/A 02/26/2017   Procedure: CARDIOVERSION;  Surgeon: Satira Sark, MD;  Location: [facility];  Service: Cardiovascular;  Laterality: N/A;   CARDIOVERSION N/A 08/07/2017   Procedure: CARDIOVERSION;  Surgeon: Pixie Casino, MD;  Location: [facility];  Service: Cardiovascular;  Laterality: N/A;   COLONOSCOPY N/A 06/15/2015   Procedure: COLONOSCOPY;  Surgeon: Rogene Houston, MD;  [facility];  Service: Endoscopy;  Laterality: N/A;  210   CYSTOSCOPY N/A 11/07/2020   Procedure: CYSTOSCOPY;  Surgeon: Cleon Gustin, MD;  Location: [facility];  Service: Urology;  Laterality: N/A;   CYSTOSCOPY W/ RETROGRADES Bilateral 05/14/2016   Procedure: CYSTOSCOPY WITH BILATERAL RETROGRADE PYELOGRAM;  Surgeon: Raynelle Bring, MD;  Location: [facility];  Service: Urology;  Laterality: Bilateral;  GENERAL ANESTHESIA WITH PARALYSIS   ESOPHAGOGASTRODUODENOSCOPY (EGD) WITH PROPOFOL N/A 06/14/2017   Procedure: ESOPHAGOGASTRODUODENOSCOPY (EGD) WITH PROPOFOL;  Surgeon: Rogene Houston, MD;  [facility];  Service: Endoscopy;  Laterality: N/A;   GIVENS CAPSULE STUDY  06/14/2017   Procedure: GIVENS CAPSULE STUDY;  Surgeon: Rogene Houston, MD;  [facility];  Service: Endoscopy;;   LEFT HEART CATHETERIZATION WITH CORONARY ANGIOGRAM  N/A 10/30/2013   Procedure: LEFT HEART CATHETERIZATION WITH CORONARY ANGIOGRAM;  Surgeon: Burnell Blanks, MD;  Location: [facility];  Service: Cardiovascular;  Laterality: N/A;   RIGHT/LEFT HEART CATH AND CORONARY ANGIOGRAPHY N/A 05/03/2017   Procedure: RIGHT/LEFT HEART CATH AND CORONARY ANGIOGRAPHY;  Surgeon: Leonie Man, MD;  Location: [place] [facility];  Service: Cardiovascular;  Laterality: N/A;   TEE WITHOUT CARDIOVERSION N/A  02/04/2017   Procedure: TRANSESOPHAGEAL ECHOCARDIOGRAM (TEE) WITH PROPOL;  Surgeon: Arnoldo Lenis, MD;  [facility];  Service: Endoscopy;  Laterality: N/A;   TONSILLECTOMY     TOTAL KNEE ARTHROPLASTY     TRANSURETHRAL RESECTION OF BLADDER TUMOR N/A 05/14/2016   Procedure: TRANSURETHRAL RESECTION OF BLADDER TUMOR (TURBT);  Surgeon: Raynelle Bring, MD;  Location[facility];  Service: Urology;  Laterality: N/A;  GENERAL ANESTHESIA WITH PARALYSIS   TRANSURETHRAL RESECTION OF BLADDER TUMOR N/A 11/07/2020   Procedure: TRANSURETHRAL RESECTION OF BLADDER TUMOR (TURBT);  Surgeon: Cleon Gustin, MD;  [facility];  Service: Urology;  Laterality: N/A;   YAG LASER APPLICATION Bilateral 0000000   Procedure: YAG LASER APPLICATION;  Surgeon: Williams Che, MD;  [facility];  Service: Ophthalmology;  Laterality: Bilateral;     Current Outpatient Medications  Medication Sig Dispense Refill   acetaminophen (TYLENOL) 500 MG tablet Take 1,000 mg by mouth every 6 (six) hours as needed.     atorvastatin (LIPITOR) 20 MG tablet TAKE 1 TABLET BY MOUTH AT BEDTIME 90 tablet 2   balsalazide (COLAZAL) 750 MG capsule TAKE THREE CAPSULES BY MOUTH THREE TIMES DAILY 270 capsule 11   bisoprolol (ZEBETA) 5 MG tablet TAKE 1/2 TABLET BY MOUTH DAILY 45 tablet 1   Carboxymethylcellulose Sodium (THERATEARS) 0.25 % SOLN Place 1 drop into both eyes in the morning and at bedtime.     CRANBERRY PO Take 4,200 mg by mouth daily. With vit C     dofetilide (TIKOSYN) 250 MCG capsule TAKE ONE CAPSULE BY MOUTH TWICE DAILY 60 capsule 6   ENTRESTO 24-26 MG TAKE 1 TABLET BY MOUTH TWICE DAILY - start ENTRESTO 48 HOURS AFTER STOPPING LISINOPRIL 60 tablet 6   furosemide (LASIX) 20 MG tablet TAKE 2 TABLETS BY MOUTH TWICE DAILY 360 tablet 0   meclizine (ANTIVERT) 25 MG tablet Take 25 mg by mouth 3 (three) times daily as needed.     mirabegron ER (MYRBETRIQ) 25 MG TB24 tablet Take 1 tablet (25 mg total) by mouth daily.  30 tablet 11   Multiple Vitamins-Minerals (ICAPS AREDS 2 PO) Take 2 tablets by mouth daily.     nitroGLYCERIN (NITROSTAT) 0.4 MG SL tablet Place 0.4 mg under the tongue every 5 (five) minutes as needed for chest pain.     OVER THE COUNTER MEDICATION Vit D 3 25 mcg (1,000units) one tablet bid.     potassium chloride SA (KLOR-CON M) 20 MEQ tablet TAKE 1 TABLET BY MOUTH TWICE DAILY 180 tablet 2   psyllium (METAMUCIL SMOOTH TEXTURE) 58.6 % powder Take 1 packet by mouth at bedtime. 283 g 12   rivaroxaban (XARELTO) 20 MG TABS tablet TAKE 1 TABLET BY MOUTH DAILY WITH SUPPER 90 tablet 1   sodium chloride (MURO 128) 2 % ophthalmic solution Place 1 drop into both eyes in the morning and at bedtime.     spironolactone (ALDACTONE) 25 MG tablet TAKE 1/2 TABLET BY MOUTH EVERY DAY 45 tablet 2   No current facility-administered medications for this visit.    Allergies:  Chlor-trimeton [chlorpheniramine], Carvedilol, Demerol, and Penicillins   Social History:  The patient  reports that she has never smoked. She has been exposed to tobacco smoke. She has never used smokeless tobacco. She reports that she does not drink alcohol and does not use drugs.   Family History:  The patient's family history includes CAD in her father; Diabetes in her father; Heart attack in her father.   ROS:  Please see the history of present illness.   Otherwise, review of systems is positive for none.   All other systems are reviewed and negative.   PHYSICAL EXAM: VS:  BP 124/70   Pulse 61   Ht '5\' 3"'$  (1.6 m)   Wt 211 lb (95.7 kg)   SpO2 94%   BMI 37.38 kg/m  , BMI Body mass index is 37.38 kg/m. Gen: Appears comfortable, well-nourished CV: RRR, no dependent edema The device site is normal -- no tenderness, edema, drainage, redness, threatened erosion. Pulm: breathing easily   EKG:  EKG is ordered today AV dual paced rhythm  Personal review of the device interrogation today. Results in [place]: 11/24/2021: BUN 17; Creatinine, Ser 0.83; Hemoglobin 13.9; Magnesium 2.4; Platelets 313; Potassium 4.5; Sodium 139    Lipid Panel  No results found for: "CHOL", "TRIG", "HDL", "CHOLHDL", "VLDL", "LDLCALC", "LDLDIRECT"   Wt Readings from Last 3 Encounters:  03/09/22 211 lb (95.7 kg)  11/24/21 215 lb 12.8 oz (97.9 kg)  10/25/21 216 lb (98 kg)      Other studies Reviewed: Additional studies/ records that were reviewed today include: TTE 11/06/18 Review of the above records today demonstrates:   1. Left ventricular ejection fraction, by visual estimation, is 20 to 25%. The left ventricle has severely decreased function. There is mildly increased left ventricular hypertrophy. There is diffuse hypokinesis.  2. Abnormal septal motion consistent with left bundle branch block.  3. Left ventricular diastolic parameters are consistent with Grade I diastolic dysfunction (impaired relaxation).  4. Global right ventricle has normal systolic function.The right ventricular size is normal. No increase in right ventricular wall thickness.  5. Left atrial size was normal.  6. Right atrial size was normal.  7. Mild to moderate mitral annular calcification.  8. Moderate aortic valve annular calcification.  9. The mitral valve is grossly normal. Mild mitral valve regurgitation. 10. The tricuspid valve is grossly normal. Tricuspid valve regurgitation is trivial. 11. The aortic valve is tricuspid. Aortic valve regurgitation is not visualized. 12. The pulmonic valve was grossly normal. Pulmonic valve regurgitation is mild. 13. The inferior vena cava is normal in size with greater than 50% respiratory variability, suggesting right atrial pressure of 3 mmHg. 14. TR signal is inadequate for assessing pulmonary artery systolic pressure.  L[facility] 05/03/17 Hemodynamic findings consistent with mild secondary pulmonary hypertension. Patient has severe nonischemic cardiomyopathy Moderate to Severely reduced  CO/CI LV end diastolic pressure is moderately elevated. Mid RCA stent widely patent There is mild (2+) mitral regurgitation.  [place] 09/16/18 personally reviewed Max 197 bpm 09:12am, 08/24 Min 38 bpm 03:09am, 08/24 Avg 68 bpm Rare PVCs Frequent PACs Atrial fibrillation: None Multiple runs of SVT, longest 11 seconds, fastest rate 197 for 8 beats  ASSESSMENT AND PLAN:  1.  Persistent atrial fibrillation: Currently on Xarelto and dofetilide.  High risk medication monitoring.  CHA2DS2-VASc of 4.  She has an increased but tolerable burden of atrial fibrillation on device. Rates are controlled  BMET ordered  2. Secondary hypercoagulable state: continue xarelto. Recent  CBC reviewed.     3. Nonischemic cardiomyopathy: Ejection fraction remains low at 30 to 35%.  She has a left bundle branch block.  She is status post [place] CRT-P implanted 03/02/2020.  Device functioning appropriately.  No changes.   4.  Coronary artery disease: Status post RCA stent in 2014.  No current chest pain.  Current medicines are reviewed at length with the patient today.   The patient does not have concerns regarding her medicines.  The following changes were made today: none  Labs/ tests ordered today include:  No orders of the defined types were placed in this encounter.   Disposition:   FU with Will Camnitz 6 months  Signed, Melida Quitter, MD  03/09/2022 4:04 PM

## 2022-03-09 NOTE — Patient Instructions (Addendum)
Medication Instructions:  Continue all current medications.  Labwork: BMET - order given today  Office will contact with results via phone, letter or mychart.    Testing/Procedures: none  Follow-Up: 1 year - Dr.  Myles Gip   Any Other Special Instructions Will Be Listed Below (If Applicable).   If you need a refill on your cardiac medications before your next appointment, please call your pharmacy.

## 2022-03-14 ENCOUNTER — Encounter: Payer: Medicare Other | Admitting: Cardiology

## 2022-03-20 DIAGNOSIS — R739 Hyperglycemia, unspecified: Secondary | ICD-10-CM | POA: Diagnosis not present

## 2022-03-20 LAB — LAB REPORT - SCANNED: EGFR: 64.2

## 2022-04-01 ENCOUNTER — Other Ambulatory Visit: Payer: Self-pay | Admitting: Cardiology

## 2022-04-02 NOTE — Telephone Encounter (Signed)
Prescription refill request for Xarelto received.  Indication: AF Last office visit: 03/09/22  A Mealor MD Weight: 95.7kg Age: 89 Scr: 0.83 on 11/24/21  Epic CrCl: 69.42  Based on above findings Xarelto 20mg  daily is the appropriate dose.  Refill approved.

## 2022-04-11 NOTE — Progress Notes (Signed)
Remote pacemaker transmission.   

## 2022-04-12 ENCOUNTER — Ambulatory Visit (INDEPENDENT_AMBULATORY_CARE_PROVIDER_SITE_OTHER): Payer: Medicare Other | Admitting: Gastroenterology

## 2022-04-12 ENCOUNTER — Ambulatory Visit: Payer: Medicare Other | Admitting: Nurse Practitioner

## 2022-04-12 ENCOUNTER — Encounter (INDEPENDENT_AMBULATORY_CARE_PROVIDER_SITE_OTHER): Payer: Self-pay | Admitting: Gastroenterology

## 2022-04-12 VITALS — BP 123/65 | HR 62 | Temp 97.5°F | Ht 63.0 in | Wt 214.0 lb

## 2022-04-12 DIAGNOSIS — K5900 Constipation, unspecified: Secondary | ICD-10-CM | POA: Diagnosis not present

## 2022-04-12 DIAGNOSIS — K5100 Ulcerative (chronic) pancolitis without complications: Secondary | ICD-10-CM

## 2022-04-12 DIAGNOSIS — R7989 Other specified abnormal findings of blood chemistry: Secondary | ICD-10-CM

## 2022-04-12 DIAGNOSIS — K8020 Calculus of gallbladder without cholecystitis without obstruction: Secondary | ICD-10-CM | POA: Diagnosis not present

## 2022-04-12 MED ORDER — BALSALAZIDE DISODIUM 750 MG PO CAPS: 2250.0000 mg | ORAL_CAPSULE | Freq: Three times a day (TID) | ORAL | 11 refills | Status: DC

## 2022-04-12 NOTE — Patient Instructions (Addendum)
continue balsalazide 2.25 g 3 times daily Start taking Miralax 1 capful every day Will request most recent labs from PCP Can implement a Mediterranean diet

## 2022-04-12 NOTE — Progress Notes (Signed)
[name], M.D. Gastroenterology & Hepatology [facility] [address] [place], [place] [address]  Primary Care Physician: Custer Nation, MD [place] [id number]  I will communicate my assessment and recommendations to the referring MD via EMR.  Problems: panulcerative colitis in remission History of symptomatic gallstones Elevated LFTs  History of Present Illness: [name] is a 89 y.o. female, with Pmh pan ulcerative colitis, bladder cancer, cardiomyopathy, coronary artery disease status post stent placement, depression, atrial fibrillation, macular degeneration, hypertension, who presents for evaluation of ulcerative colitis.  The patient was last seen on 04/11/2021 (Dr. Laural Golden). At that time, the patient was advised to continue balsalazide 2.25 g 3 times daily and to continue using Metamucil.  Reports that for the last few months she has had episodes of constipation and bloating, which last for 2 days, for which she uses suppositories. This is ollowed by 3-4  normal but large Bms Has seen some black specks but the rest is normal. No blood in stool. Has also noticed having blaoting when she gets very stressed. Has noticed more gas production/flatulence recently.  She has had decreased appetite and is only eating 2 times a day. She is taking Dr. Julien Girt protein shakes daily. The patient denies having any nausea, vomiting, fever, chills, hematochezia, melena, hematemesis, abdominal distention, abdominal pain, diarrhea, jaundice, pruritus . Has lost 7 lb for the last year.  Patient reports that she had blood workup done in March 2024 and she was told she had changes consistent with fatty liver. Notably, the patient was admitted to the hospital in 2021 after presenting symptomatic cholelithiasis and possible pancreatitis with a passing stone.  She opted for expectant management.  The patient has subsequent CT abdomen  pelvis without contrast on 02/26/2022 which showed presence of a calcified granuloma in the right lobe of the liver and a 1 cm cyst in the left lobe of the liver.  She had cholelithiasis but no other abnormalities.  This was performed for evaluation of possible LFT elevation and history of cholelithiasis.  Unfortunately, no recent LFTs are available today.  Last EGD: 06/14/2017, 2 cm hiatal hernia, normal stomach and duodenum.  A capsule endoscopy was deployed. Capsule endoscopy performed on 06/15/2017 which showed small bowel small ulcer with brown eschar but no active bleeding.  There was also small submucosal lesion concerning for a submucosal lipoma. Last Colonoscopy: June 2017 with removal of 5 small tubular adenomas (polyps were removed from cecum between 4 to 7 mm, sigmoid colon and splenic flexure).  UC was in remission.  Past Medical History: Past Medical History:  Diagnosis Date   Arthritis    Atrial fibrillation    Atrial flutter    Bladder cancer    Cardiomyopathy    Coronary atherosclerosis of native coronary artery    a. BMS RCA May 2014 - [place] stent. b. Cath 04/2017 - patent stent, minimal CAD otherwise.   Depression    Essential hypertension    GI bleeding 06/2017   a. melena/small bowel ulcer by capsule endo 06/2017.   Hyperlipemia    Macular degeneration    NICM (nonischemic cardiomyopathy)    Persistent atrial fibrillation    Presence of permanent cardiac pacemaker    Ulcerative colitis     Past Surgical History: Past Surgical History:  Procedure Laterality Date   ABDOMINAL HYSTERECTOMY     APPENDECTOMY     Bilateral knee replacements      2007, 2008  BIV PACEMAKER INSERTION CRT-P N/A 03/02/2020   Procedure: BIV PACEMAKER INSERTION CRT-P;  Surgeon: Constance Haw, MD;  Location: [facility];  Service: Cardiovascular;  Laterality: N/A;   BLADDER SURGERY     CARDIOVERSION N/A 02/04/2017   Procedure: CARDIOVERSION;  Surgeon: Arnoldo Lenis, MD;   [facility];  Service: Endoscopy;  Laterality: N/A;   CARDIOVERSION N/A 02/26/2017   Procedure: CARDIOVERSION;  Surgeon: Satira Sark, MD;  Location: [facility];  Service: Cardiovascular;  Laterality: N/A;   CARDIOVERSION N/A 08/07/2017   Procedure: CARDIOVERSION;  Surgeon: Pixie Casino, MD;  Location: [facility];  Service: Cardiovascular;  Laterality: N/A;   COLONOSCOPY N/A 06/15/2015   Procedure: COLONOSCOPY;  Surgeon: Rogene Houston, MD;  [facility];  Service: Endoscopy;  Laterality: N/A;  210   CYSTOSCOPY N/A 11/07/2020   Procedure: CYSTOSCOPY;  Surgeon: Cleon Gustin, MD;  Location: [facility];  Service: Urology;  Laterality: N/A;   CYSTOSCOPY W/ RETROGRADES Bilateral 05/14/2016   Procedure: CYSTOSCOPY WITH BILATERAL RETROGRADE PYELOGRAM;  Surgeon: Raynelle Bring, MD;  Location: [facility];  Service: Urology;  Laterality: Bilateral;  GENERAL ANESTHESIA WITH PARALYSIS   ESOPHAGOGASTRODUODENOSCOPY (EGD) WITH PROPOFOL N/A 06/14/2017   Procedure: ESOPHAGOGASTRODUODENOSCOPY (EGD) WITH PROPOFOL;  Surgeon: Rogene Houston, MD;  [facility];  Service: Endoscopy;  Laterality: N/A;   GIVENS CAPSULE STUDY  06/14/2017   Procedure: GIVENS CAPSULE STUDY;  Surgeon: Rogene Houston, MD;  [facility];  Service: Endoscopy;;   LEFT HEART CATHETERIZATION WITH CORONARY ANGIOGRAM N/A 10/30/2013   Procedure: LEFT HEART CATHETERIZATION WITH CORONARY ANGIOGRAM;  Surgeon: Burnell Blanks, MD;  Location: [facility];  Service: Cardiovascular;  Laterality: N/A;   RIGHT/LEFT HEART CATH AND CORONARY ANGIOGRAPHY N/A 05/03/2017   Procedure: RIGHT/LEFT HEART CATH AND CORONARY ANGIOGRAPHY;  Surgeon: Leonie Man, MD;  Location: [facility];  Service: Cardiovascular;  Laterality: N/A;   TEE WITHOUT CARDIOVERSION N/A 02/04/2017   Procedure: TRANSESOPHAGEAL ECHOCARDIOGRAM (TEE) WITH PROPOL;  Surgeon: Arnoldo Lenis, MD;  [facility];  Service:  Endoscopy;  Laterality: N/A;   TONSILLECTOMY     TOTAL KNEE ARTHROPLASTY     TRANSURETHRAL RESECTION OF BLADDER TUMOR N/A 05/14/2016   Procedure: TRANSURETHRAL RESECTION OF BLADDER TUMOR (TURBT);  Surgeon: Raynelle Bring, MD;  Location: [facility];  Service: Urology;  Laterality: N/A;  GENERAL ANESTHESIA WITH PARALYSIS   TRANSURETHRAL RESECTION OF BLADDER TUMOR N/A 11/07/2020   Procedure: TRANSURETHRAL RESECTION OF BLADDER TUMOR (TURBT);  Surgeon: Cleon Gustin, MD;  Location: [facility];  Service: Urology;  Laterality: N/A;   YAG LASER APPLICATION Bilateral 0000000   Procedure: YAG LASER APPLICATION;  Surgeon: Williams Che, MD;  Location: [facility];  Service: Ophthalmology;  Laterality: Bilateral;    Family History: Family History  Problem Relation Age of Onset   CAD Father    Diabetes Father    Heart attack Father     Social History: Social History   Tobacco Use  Smoking Status Never   Passive exposure: Past  Smokeless Tobacco Never  Tobacco Comments   Never smoke 10/04/21   Social History   Substance and Sexual Activity  Alcohol Use No   Alcohol/week: 0.0 standard drinks of alcohol   Social History   Substance and Sexual Activity  Drug Use No    Allergies: Allergies  Allergen Reactions   Chlor-Trimeton [Chlorpheniramine] Shortness Of Breath   Carvedilol Itching and Rash  Facial rash/itching   Demerol Rash   Penicillins Rash and Other (See Comments)    REACTION: rash, years ago Has patient had a PCN reaction causing immediate rash, facial/tongue/throat swelling, SOB or lightheadedness with hypotension: Yes Has patient had a PCN reaction causing severe rash involving mucus membranes or skin necrosis: No Has patient had a PCN reaction that required hospitalization No Has patient had a PCN reaction occurring within the last 10 years: No If all of the above answers are "NO", then may proceed with Cephalosporin use.     Medications: Current Outpatient  Medications  Medication Sig Dispense Refill   acetaminophen (TYLENOL) 500 MG tablet Take 1,000 mg by mouth every 6 (six) hours as needed.     atorvastatin (LIPITOR) 20 MG tablet TAKE 1 TABLET BY MOUTH AT BEDTIME 90 tablet 2   balsalazide (COLAZAL) 750 MG capsule TAKE THREE CAPSULES BY MOUTH THREE TIMES DAILY 270 capsule 11   bisoprolol (ZEBETA) 5 MG tablet TAKE 1/2 TABLET BY MOUTH DAILY 45 tablet 1   Carboxymethylcellulose Sodium (THERATEARS) 0.25 % SOLN Place 1 drop into both eyes in the morning and at bedtime.     CRANBERRY PO Take 4,200 mg by mouth daily. With vit C     dofetilide (TIKOSYN) 250 MCG capsule TAKE ONE CAPSULE BY MOUTH TWICE DAILY 60 capsule 6   ENTRESTO 24-26 MG TAKE 1 TABLET BY MOUTH TWICE DAILY - start ENTRESTO 48 HOURS AFTER STOPPING LISINOPRIL 60 tablet 6   furosemide (LASIX) 20 MG tablet TAKE 2 TABLETS BY MOUTH TWICE DAILY 360 tablet 0   meclizine (ANTIVERT) 25 MG tablet Take 25 mg by mouth 3 (three) times daily as needed.     mirabegron ER (MYRBETRIQ) 25 MG TB24 tablet Take 1 tablet (25 mg total) by mouth daily. 30 tablet 11   Multiple Vitamins-Minerals (ICAPS AREDS 2 PO) Take 2 tablets by mouth daily.     nitroGLYCERIN (NITROSTAT) 0.4 MG SL tablet Place 0.4 mg under the tongue every 5 (five) minutes as needed for chest pain.     OVER THE COUNTER MEDICATION Vit D 3 25 mcg (1,000units) one tablet bid.     potassium chloride SA (KLOR-CON M) 20 MEQ tablet TAKE 1 TABLET BY MOUTH TWICE DAILY 180 tablet 2   psyllium (METAMUCIL SMOOTH TEXTURE) 58.6 % powder Take 1 packet by mouth at bedtime. 283 g 12   sodium chloride (MURO 128) 2 % ophthalmic solution Place 1 drop into both eyes in the morning and at bedtime.     spironolactone (ALDACTONE) 25 MG tablet TAKE 1/2 TABLET BY MOUTH EVERY DAY 45 tablet 2   XARELTO 20 MG TABS tablet TAKE 1 TABLET BY MOUTH DAILY WITH SUPPER 90 tablet 1   No current facility-administered medications for this visit.    Review of Systems: GENERAL:  negative for malaise, night sweats HEENT: No changes in hearing or vision, no nose bleeds or other nasal problems. NECK: Negative for lumps, goiter, pain and significant neck swelling RESPIRATORY: Negative for cough, wheezing CARDIOVASCULAR: Negative for chest pain, leg swelling, palpitations, orthopnea GI: SEE HPI MUSCULOSKELETAL: Negative for joint pain or swelling, back pain, and muscle pain. SKIN: Negative for lesions, rash PSYCH: Negative for sleep disturbance, mood disorder and recent psychosocial stressors. HEMATOLOGY Negative for prolonged bleeding, bruising easily, and swollen nodes. ENDOCRINE: Negative for cold or heat intolerance, polyuria, polydipsia and goiter. NEURO: negative for tremor, gait imbalance, syncope and seizures. The remainder of the review of systems is noncontributory.   Physical  Exam: BP 123/65 (BP Location: Left Arm, Patient Position: Sitting, Cuff Size: Large)   Pulse 62   Temp (!) 97.5 F (36.4 C) (Temporal)   Ht 5\' 3"  (1.6 m)   Wt 214 lb (97.1 kg)   BMI 37.91 kg/m  GENERAL: The patient is AO x3, in no acute distress. HEENT: Head is normocephalic and atraumatic. EOMI are intact. Mouth is well hydrated and without lesions. NECK: Supple. No masses LUNGS: Clear to auscultation. No presence of rhonchi/wheezing/rales. Adequate chest expansion HEART: RRR, normal s1 and s2. ABDOMEN: Soft, nontender, no guarding, no peritoneal signs, and nondistended. BS +. No masses. EXTREMITIES: Without any cyanosis, clubbing, rash, lesions or edema. NEUROLOGIC: AOx3, no focal motor deficit. SKIN: no jaundice, no rashes  Imaging/Labs: as above  I personally reviewed and interpreted the available labs, imaging and endoscopic files.  Impression and Plan: DARTHULA HELFAND is a 89 y.o. female, with Pmh pan ulcerative colitis, bladder cancer, cardiomyopathy, coronary artery disease status post stent placement, depression, atrial fibrillation, macular degeneration,  hypertension, who presents for evaluation of ulcerative colitis.  Patient has had adequate control of her ulcerative colitis and has not presented any flares while taking balsalazide compliantly.  However, she has presented some recurrent issues with constipation which is likely leading to other symptoms such as bloating and flatulence.  She will benefit from starting MiraLAX on a regular basis to keep her bowels softer and more regular.  She is up-to-date in terms of her preventative measures.  She was found to have some elevation in her LFTs recently.  Is unclear which marker was elevated.  Will request the most recent labs performed her PCPs office.  For now, she can implement a Mediterranean diet as she was told she had findings concerning for fatty liver, although recent imaging did not support this.  -continue balsalazide 2.25 g 3 times daily -Start taking Miralax 1 capful every day -Will request most recent labs from PCP -Can implement a Mediterranean diet  ADDENDUM: Received the most recent blood workup after the patient left the office.  Labs were performed on 03/20/2022, CMP showed sodium 139, potassium 4.4, AST 13, ALT 6, albumin 4-66, albumin 4.3, creatinine 0.86, BUN 19, total bilirubin 2.1.  Labs from 02/06/2022 showed a WBC of 12.2, platelets 292, hemoglobin 13, CMP with BUN 32, creatinine 0.88, normal electrolytes, ALT 9, AST 12, alkaline phosphatase 52, total bili 1.5.   Given the presence of new onset hyperbilirubinemia, we will check fractionated bilirubin and liver ultrasound.  All questions were answered.      [name], MD Gastroenterology and Hepatology [facility]

## 2022-04-16 DIAGNOSIS — C679 Malignant neoplasm of bladder, unspecified: Secondary | ICD-10-CM | POA: Diagnosis not present

## 2022-04-16 DIAGNOSIS — R7989 Other specified abnormal findings of blood chemistry: Secondary | ICD-10-CM | POA: Diagnosis not present

## 2022-04-23 DIAGNOSIS — I4891 Unspecified atrial fibrillation: Secondary | ICD-10-CM | POA: Diagnosis not present

## 2022-04-23 DIAGNOSIS — I482 Chronic atrial fibrillation, unspecified: Secondary | ICD-10-CM | POA: Diagnosis not present

## 2022-04-23 DIAGNOSIS — M19011 Primary osteoarthritis, right shoulder: Secondary | ICD-10-CM | POA: Diagnosis not present

## 2022-04-23 DIAGNOSIS — N390 Urinary tract infection, site not specified: Secondary | ICD-10-CM | POA: Diagnosis not present

## 2022-04-23 DIAGNOSIS — K51918 Ulcerative colitis, unspecified with other complication: Secondary | ICD-10-CM | POA: Diagnosis not present

## 2022-04-27 ENCOUNTER — Ambulatory Visit: Payer: Medicare Other | Attending: Nurse Practitioner | Admitting: Nurse Practitioner

## 2022-04-27 ENCOUNTER — Encounter: Payer: Self-pay | Admitting: Nurse Practitioner

## 2022-04-27 VITALS — BP 110/64 | HR 61 | Ht 63.0 in | Wt 213.0 lb

## 2022-04-27 DIAGNOSIS — I428 Other cardiomyopathies: Secondary | ICD-10-CM | POA: Diagnosis not present

## 2022-04-27 DIAGNOSIS — I2510 Atherosclerotic heart disease of native coronary artery without angina pectoris: Secondary | ICD-10-CM

## 2022-04-27 DIAGNOSIS — I5022 Chronic systolic (congestive) heart failure: Secondary | ICD-10-CM

## 2022-04-27 DIAGNOSIS — I4819 Other persistent atrial fibrillation: Secondary | ICD-10-CM

## 2022-04-27 DIAGNOSIS — R531 Weakness: Secondary | ICD-10-CM

## 2022-04-27 DIAGNOSIS — E785 Hyperlipidemia, unspecified: Secondary | ICD-10-CM

## 2022-04-27 DIAGNOSIS — I480 Paroxysmal atrial fibrillation: Secondary | ICD-10-CM | POA: Diagnosis not present

## 2022-04-27 DIAGNOSIS — I5020 Unspecified systolic (congestive) heart failure: Secondary | ICD-10-CM

## 2022-04-27 DIAGNOSIS — I10 Essential (primary) hypertension: Secondary | ICD-10-CM

## 2022-04-27 NOTE — Progress Notes (Unsigned)
Office Visit    Patient Name: [name] Date of Encounter: 04/27/2022  PCP:  Donetta Potts, MD   [facility]  Cardiologist:  Nona Dell, MD  Advanced Practice Provider:  Sharlene Dory, NP Electrophysiologist:  Regan Lemming, MD   Chief Complaint    [name] is a 89 y.o. female with a hx of HFrEF, nonischemic cardiomyopathy, paroxysmal to persistent atrial fibrillation/A-flutter, CAD, hypertension, hyperlipidemia, ulcerative colitis, history of bladder cancer, pacemaker, who presents today for overdue follow-up.    Past Medical History    Past Medical History:  Diagnosis Date   Arthritis    Atrial fibrillation    Atrial flutter    Bladder cancer    Cardiomyopathy    Coronary atherosclerosis of native coronary artery    a. BMS RCA May 2014 - [place] stent. b. Cath 04/2017 - patent stent, minimal CAD otherwise.   Depression    Essential hypertension    GI bleeding 06/2017   a. melena/small bowel ulcer by capsule endo 06/2017.   Hyperlipemia    Macular degeneration    NICM (nonischemic cardiomyopathy)    Persistent atrial fibrillation    Presence of permanent cardiac pacemaker    Ulcerative colitis    Past Surgical History:  Procedure Laterality Date   ABDOMINAL HYSTERECTOMY     APPENDECTOMY     Bilateral knee replacements      2007, 2008   BIV PACEMAKER INSERTION CRT-P N/A 03/02/2020   Procedure: BIV PACEMAKER INSERTION CRT-P;  Surgeon: Regan Lemming, MD;  Location: [facility] INVASIVE CV LAB;  Service: Cardiovascular;  Laterality: N/A;   BLADDER SURGERY     CARDIOVERSION N/A 02/04/2017   Procedure: CARDIOVERSION;  Surgeon: Antoine Poche, MD;  Location: [facility] ENDO [facility];  Service: Endoscopy;  Laterality: N/A;   CARDIOVERSION N/A 02/26/2017   Procedure: CARDIOVERSION;  Surgeon: Jonelle Sidle, MD;  Location: [facility] ORS;  Service: Cardiovascular;  Laterality: N/A;   CARDIOVERSION N/A 08/07/2017   Procedure:  CARDIOVERSION;  Surgeon: Chrystie Nose, MD;  Location: [facility] ENDOSCOPY;  Service: Cardiovascular;  Laterality: N/A;   COLONOSCOPY N/A 06/15/2015   Procedure: COLONOSCOPY;  Surgeon: Malissa Hippo, MD;  Location: [facility];  Service: Endoscopy;  Laterality: N/A;  210   CYSTOSCOPY N/A 11/07/2020   Procedure: CYSTOSCOPY;  Surgeon: Malen Gauze, MD;  Location: [facility] ORS;  Service: Urology;  Laterality: N/A;   CYSTOSCOPY W/ RETROGRADES Bilateral 05/14/2016   Procedure: CYSTOSCOPY WITH BILATERAL RETROGRADE PYELOGRAM;  Surgeon: Heloise Purpura, MD;  Location: [facility] ORS;  Service: Urology;  Laterality: Bilateral;  GENERAL ANESTHESIA WITH PARALYSIS   ESOPHAGOGASTRODUODENOSCOPY (EGD) WITH PROPOFOL N/A 06/14/2017   Procedure: ESOPHAGOGASTRODUODENOSCOPY (EGD) WITH PROPOFOL;  Surgeon: Malissa Hippo, MD;  [facility]: [facility];  Service: Endoscopy;  Laterality: N/A;   GIVENS CAPSULE STUDY  06/14/2017   Procedure: GIVENS CAPSULE STUDY;  Surgeon: Malissa Hippo, MD;  [facility]: [facility];  Service: Endoscopy;;   LEFT HEART CATHETERIZATION WITH CORONARY ANGIOGRAM N/A 10/30/2013   Procedure: LEFT HEART CATHETERIZATION WITH CORONARY ANGIOGRAM;  Surgeon: Kathleene Hazel, MD;  Location: [facility] CATH LAB;  Service: Cardiovascular;  Laterality: N/A;   RIGHT/LEFT HEART CATH AND CORONARY ANGIOGRAPHY N/A 05/03/2017   Procedure: RIGHT/LEFT HEART CATH AND CORONARY ANGIOGRAPHY;  Surgeon: Marykay Lex, MD;  Location: [facility] INVASIVE CV LAB;  Service: Cardiovascular;  Laterality: N/A;   TEE WITHOUT CARDIOVERSION N/A 02/04/2017   Procedure: TRANSESOPHAGEAL ECHOCARDIOGRAM (TEE) WITH PROPOL;  Surgeon: Antoine Poche, MD;  [facility];  Service: Endoscopy;  Laterality: N/A;   TONSILLECTOMY     TOTAL KNEE ARTHROPLASTY     TRANSURETHRAL RESECTION OF BLADDER TUMOR N/A 05/14/2016   Procedure: TRANSURETHRAL RESECTION OF BLADDER TUMOR (TURBT);  Surgeon: Heloise Purpura, MD;  Location: [facility] ORS;  Service: Urology;   Laterality: N/A;  GENERAL ANESTHESIA WITH PARALYSIS   TRANSURETHRAL RESECTION OF BLADDER TUMOR N/A 11/07/2020   Procedure: TRANSURETHRAL RESECTION OF BLADDER TUMOR (TURBT);  Surgeon: Malen Gauze, MD;  Location: [facility];  Service: Urology;  Laterality: N/A;   YAG LASER APPLICATION Bilateral 11/10/2012   Procedure: YAG LASER APPLICATION;  Surgeon: Susa Simmonds, MD;  Location: [facility];  Service: Ophthalmology;  Laterality: Bilateral;    Allergies  Allergies  Allergen Reactions   Chlor-Trimeton [Chlorpheniramine] Shortness Of Breath   Carvedilol Itching and Rash    Facial rash/itching   Demerol Rash   Penicillins Rash and Other (See Comments)    REACTION: rash, years ago Has patient had a PCN reaction causing immediate rash, facial/tongue/throat swelling, SOB or lightheadedness with hypotension: Yes Has patient had a PCN reaction causing severe rash involving mucus membranes or skin necrosis: No Has patient had a PCN reaction that required hospitalization No Has patient had a PCN reaction occurring within the last 10 years: No If all of the above answers are "NO", then may proceed with Cephalosporin use.     History of Present Illness    [name] is a 89 y.o. female with a PMH as mentioned above.  Last seen by Dr. Diona Browner on February 10, 2021. Weight was up, more related to caloric intake rather than fluid. Denied chest pain. Follow-up Echo revealed EF improved to 40% (previous EF 30-35%), mild LVH.  Today she presents for follow-up. She states she is having more weakness recently.  Says "some days are worse than others."  She does admit that her weakness appears worse than 1 year ago.  2 days ago, she noticed a stinging sensation under her left breast, not bothersome per her report. Denies any chest pain, shortness of breath, palpitations, syncope, presyncope, dizziness, orthopnea, PND, swelling or significant weight changes, acute bleeding, or claudication. Compliant  with her medications and tolerating well.  Enjoys spending time with family.  Originally from [place].  Has been married for over 70 years.  EKGs/Labs/Other Studies Reviewed:   The following studies were reviewed today:   EKG:  EKG is not ordered today.    Echo 03/2021: 1. Left ventricular ejection fraction, by estimation, is 40%. The left  ventricle has low normal function. The left ventricle demonstrates global  hypokinesis. There is mild left ventricular hypertrophy. Left ventricular  diastolic parameters are  indeterminate.   2. RV not well visualized. Grossly appears normal in size and function. .  Right ventricular systolic function was not well visualized. The right  ventricular size is not well visualized.   3. The mitral valve was not well visualized. No evidence of mitral valve  regurgitation. No evidence of mitral stenosis.   4. The aortic valve is tricuspid. Aortic valve regurgitation is not  visualized. No aortic stenosis is present.   5. The inferior vena cava is normal in size with greater than 50%  respiratory variability, suggesting right atrial pressure of 3 mmHg.   Comparison(s): LVEF 30-35%.   Recent Labs: 11/24/2021: BUN 17; Creatinine, Ser 0.83; Hemoglobin 13.9; Magnesium 2.4; Platelets 313; Potassium 4.5; Sodium 139  Recent Lipid  Panel No results found for: "CHOL", "TRIG", "HDL", "CHOLHDL", "VLDL", "LDLCALC", "LDLDIRECT"  Risk Assessment/Calculations:   CHA2DS2-VASc Score = 6  This indicates a 9.7% annual risk of stroke. The patient's score is based upon: CHF History: 1 HTN History: 1 Diabetes History: 0 Stroke History: 0 Vascular Disease History: 1 Age Score: 2 Gender Score: 1    Home Medications   Current Meds  Medication Sig   acetaminophen (TYLENOL) 500 MG tablet Take 1,000 mg by mouth every 6 (six) hours as needed.   atorvastatin (LIPITOR) 20 MG tablet TAKE 1 TABLET BY MOUTH AT BEDTIME   balsalazide (COLAZAL) 750 MG capsule Take 3  capsules (2,250 mg total) by mouth 3 (three) times daily.   bisoprolol (ZEBETA) 5 MG tablet TAKE 1/2 TABLET BY MOUTH DAILY   Carboxymethylcellulose Sodium (THERATEARS) 0.25 % SOLN Place 1 drop into both eyes in the morning and at bedtime.   CRANBERRY PO Take 4,200 mg by mouth daily. With vit C   dofetilide (TIKOSYN) 250 MCG capsule TAKE ONE CAPSULE BY MOUTH TWICE DAILY   ENTRESTO 24-26 MG TAKE 1 TABLET BY MOUTH TWICE DAILY - start ENTRESTO 48 HOURS AFTER STOPPING LISINOPRIL   furosemide (LASIX) 20 MG tablet TAKE 2 TABLETS BY MOUTH TWICE DAILY   meclizine (ANTIVERT) 25 MG tablet Take 25 mg by mouth 3 (three) times daily as needed.   mirabegron ER (MYRBETRIQ) 25 MG TB24 tablet Take 1 tablet (25 mg total) by mouth daily.   Multiple Vitamins-Minerals (ICAPS AREDS 2 PO) Take 2 tablets by mouth daily.   nitroGLYCERIN (NITROSTAT) 0.4 MG SL tablet Place 0.4 mg under the tongue every 5 (five) minutes as needed for chest pain.   OVER THE COUNTER MEDICATION Vit D 3 25 mcg (1,000units) one tablet bid.   potassium chloride SA (KLOR-CON M) 20 MEQ tablet TAKE 1 TABLET BY MOUTH TWICE DAILY   psyllium (METAMUCIL SMOOTH TEXTURE) 58.6 % powder Take 1 packet by mouth at bedtime.   sodium chloride (MURO 128) 2 % ophthalmic solution Place 1 drop into both eyes in the morning and at bedtime.   spironolactone (ALDACTONE) 25 MG tablet TAKE 1/2 TABLET BY MOUTH EVERY DAY   XARELTO 20 MG TABS tablet TAKE 1 TABLET BY MOUTH DAILY WITH SUPPER     Review of Systems    All other systems reviewed and are otherwise negative except as noted above.  Physical Exam    VS:  BP 110/64 (BP Location: Right Arm, Patient Position: Sitting, Cuff Size: Large)   Pulse 61   Ht  (1.6 m)   Wt 213 lb (96.6 kg)   SpO2 95%   BMI 37.73 kg/m  , BMI Body mass index is 37.73 kg/m.  Wt Readings from Last 3 Encounters:  04/27/22 213 lb (96.6 kg)  04/12/22 214 lb (97.1 kg)  03/09/22 211 lb (95.7 kg)     GEN: Obese, 89 y.o. female,  in no acute distress. HEENT: normal. Neck: Supple, no JVD, carotid bruits, or masses. Cardiac: S1/S2, RRR, no murmurs, rubs, or gallops. No clubbing, cyanosis. Nonpitting edema, stable 1+ (L >R) per her report.  Radials/PT 2+ and equal bilaterally.  Respiratory:  Respirations regular and unlabored, clear to auscultation bilaterally. MS: No deformity or atrophy. Skin: Pale, warm and dry, no rash. Neuro:  Strength and sensation are intact. Psych: Normal affect.  Assessment & Plan    HFmrEF, NICM Echo 03/2021 revealed EF mildly reduced at 40%, improved from previous study at 30-35%. Euvolemic and well  compensated on exam, however admits to weakness worsening from 1 year ago - etiology multifactorial, mild/stable leg edema on exam. Will update Echo at this time. Recent labs stable. Continue bisoprolol, Entresto, Lasix, and Aldactone. Low sodium diet, fluid restriction <2L, and daily weights encouraged. Educated to contact our office for weight gain of 2 lbs overnight or 5 lbs in one week.   Paroxysmal to Persistent atrial fibrillation/A-flutter Denies any tachycardia/palpitations. HR well controlled today. Continue bisoprolol and Xarelto 20 mg daily. Denies any bleeding issues. Heart healthy diet and regular cardiovascular exercise encouraged.  3. CAD Stable with no anginal symptoms. No indication for ischemic evaluation. Continue current medication regimen. Heart healthy diet and regular cardiovascular exercise encouraged.   4. HLD Continue current medications. Heart healthy diet and regular cardiovascular exercise encouraged.   5. HTN BP stable. Continue current medications. Discussed to monitor BP at home at least 2 hours after medications and sitting for 5-10 minutes. Heart healthy diet and regular cardiovascular exercise encouraged.   6. Weakness Etiology multifactorial. Recent labs overall unremarkable. Updating Echo as mentioned above. No medication changes at this time. Heart healthy  diet and regular cardiovascular exercise encouraged.   Disposition: Follow up with Nona Dell, MD or APP as scheduled or sooner if anything changes.  Signed, Sharlene Dory, NP 04/30/2022, 1:33 PM [facility]

## 2022-04-27 NOTE — Patient Instructions (Signed)
Medication Instructions:  Your physician recommends that you continue on your current medications as directed. Please refer to the Current Medication list given to you today.   Labwork: none  Testing/Procedures: Your physician has requested that you have an echocardiogram. Echocardiography is a painless test that uses sound waves to create images of your heart. It provides your doctor with information about the size and shape of your heart and how well your heart's chambers and valves are working. This procedure takes approximately one hour. There are no restrictions for this procedure. Please do NOT wear cologne, perfume, aftershave, or lotions (deodorant is allowed). Please arrive 15 minutes prior to your appointment time.   Follow-Up:  Your physician recommends that you schedule a follow-up appointment in: September with Dr. Diona Browner  Any Other Special Instructions Will Be Listed Below (If Applicable).  If you need a refill on your cardiac medications before your next appointment, please call your pharmacy.

## 2022-05-02 DIAGNOSIS — S0990XA Unspecified injury of head, initial encounter: Secondary | ICD-10-CM | POA: Diagnosis not present

## 2022-05-02 DIAGNOSIS — I6789 Other cerebrovascular disease: Secondary | ICD-10-CM | POA: Diagnosis not present

## 2022-05-02 DIAGNOSIS — X58XXXA Exposure to other specified factors, initial encounter: Secondary | ICD-10-CM | POA: Diagnosis not present

## 2022-05-02 DIAGNOSIS — Z6838 Body mass index (BMI) 38.0-38.9, adult: Secondary | ICD-10-CM | POA: Diagnosis not present

## 2022-05-02 DIAGNOSIS — M25579 Pain in unspecified ankle and joints of unspecified foot: Secondary | ICD-10-CM | POA: Diagnosis not present

## 2022-05-02 DIAGNOSIS — M79673 Pain in unspecified foot: Secondary | ICD-10-CM | POA: Diagnosis not present

## 2022-05-03 DIAGNOSIS — S92352A Displaced fracture of fifth metatarsal bone, left foot, initial encounter for closed fracture: Secondary | ICD-10-CM | POA: Diagnosis not present

## 2022-05-08 DIAGNOSIS — I509 Heart failure, unspecified: Secondary | ICD-10-CM | POA: Diagnosis not present

## 2022-05-08 DIAGNOSIS — I4819 Other persistent atrial fibrillation: Secondary | ICD-10-CM | POA: Diagnosis not present

## 2022-05-21 DIAGNOSIS — S92352D Displaced fracture of fifth metatarsal bone, left foot, subsequent encounter for fracture with routine healing: Secondary | ICD-10-CM | POA: Diagnosis not present

## 2022-05-28 ENCOUNTER — Ambulatory Visit: Payer: Medicare Other | Attending: Nurse Practitioner

## 2022-05-28 DIAGNOSIS — I5022 Chronic systolic (congestive) heart failure: Secondary | ICD-10-CM

## 2022-05-28 LAB — ECHOCARDIOGRAM COMPLETE
AR max vel: 2.97 cm2
AV Area VTI: 3.04 cm2
AV Area mean vel: 2.78 cm2
AV Mean grad: 4 mmHg
AV Peak grad: 8 mmHg
Ao pk vel: 1.41 m/s
Area-P 1/2: 2.24 cm2
Calc EF: 50.9 %
MV M vel: 2.63 m/s
MV Peak grad: 27.7 mmHg
S' Lateral: 3.4 cm
Single Plane A2C EF: 54 %
Single Plane A4C EF: 50.7 %

## 2022-05-30 ENCOUNTER — Other Ambulatory Visit: Payer: Self-pay | Admitting: Cardiology

## 2022-06-05 ENCOUNTER — Ambulatory Visit (INDEPENDENT_AMBULATORY_CARE_PROVIDER_SITE_OTHER): Payer: Medicare Other

## 2022-06-05 DIAGNOSIS — I428 Other cardiomyopathies: Secondary | ICD-10-CM

## 2022-06-05 DIAGNOSIS — I4819 Other persistent atrial fibrillation: Secondary | ICD-10-CM

## 2022-06-05 LAB — CUP PACEART REMOTE DEVICE CHECK
Battery Remaining Longevity: 53 mo
Battery Remaining Percentage: 66 %
Battery Voltage: 2.98 V
Brady Statistic AP VP Percent: 80 %
Brady Statistic AP VS Percent: 1 %
Brady Statistic AS VP Percent: 19 %
Brady Statistic AS VS Percent: 1 %
Brady Statistic RA Percent Paced: 68 %
Date Time Interrogation Session: 20240528021332
Implantable Lead Connection Status: 753985
Implantable Lead Connection Status: 753985
Implantable Lead Connection Status: 753985
Implantable Lead Implant Date: 20220223
Implantable Lead Implant Date: 20220223
Implantable Lead Implant Date: 20220223
Implantable Lead Location: 753858
Implantable Lead Location: 753859
Implantable Lead Location: 753860
Implantable Pulse Generator Implant Date: 20220223
Lead Channel Impedance Value: 510 Ohm
Lead Channel Impedance Value: 590 Ohm
Lead Channel Impedance Value: 730 Ohm
Lead Channel Pacing Threshold Amplitude: 0.5 V
Lead Channel Pacing Threshold Amplitude: 1.25 V
Lead Channel Pacing Threshold Amplitude: 1.25 V
Lead Channel Pacing Threshold Pulse Width: 0.5 ms
Lead Channel Pacing Threshold Pulse Width: 0.5 ms
Lead Channel Pacing Threshold Pulse Width: 0.8 ms
Lead Channel Sensing Intrinsic Amplitude: 4.2 mV
Lead Channel Sensing Intrinsic Amplitude: 7.5 mV
Lead Channel Setting Pacing Amplitude: 2.25 V
Lead Channel Setting Pacing Amplitude: 2.5 V
Lead Channel Setting Pacing Amplitude: 2.5 V
Lead Channel Setting Pacing Pulse Width: 0.5 ms
Lead Channel Setting Pacing Pulse Width: 0.8 ms
Lead Channel Setting Sensing Sensitivity: 2 mV
Pulse Gen Model: 3562
Pulse Gen Serial Number: 3861599

## 2022-06-06 ENCOUNTER — Telehealth: Payer: Self-pay

## 2022-06-06 NOTE — Telephone Encounter (Signed)
Patient notified and verbalized understanding. Patient had no questions or concerns at this time. PCP copied 

## 2022-06-06 NOTE — Telephone Encounter (Signed)
-----   Message from Sharlene Dory, NP sent at 06/02/2022 11:22 PM EDT ----- Echo shows normal heart pumping function.  Mildly stiff left ventricle during relaxation phase, mildly thick muscle of left ventricle, common findings related to her age.  Aortic valve is mildly calcified.  Aortic root is mildly dilated, measuring 40 mm.  Recommend updating echocardiogram in 1 year for monitoring of the aortic root.  Overall reassuring echocardiogram.  Sharlene Dory, AGNP-C

## 2022-06-12 DIAGNOSIS — S92352D Displaced fracture of fifth metatarsal bone, left foot, subsequent encounter for fracture with routine healing: Secondary | ICD-10-CM | POA: Diagnosis not present

## 2022-06-27 DIAGNOSIS — K5190 Ulcerative colitis, unspecified, without complications: Secondary | ICD-10-CM | POA: Diagnosis not present

## 2022-06-27 DIAGNOSIS — R32 Unspecified urinary incontinence: Secondary | ICD-10-CM | POA: Diagnosis not present

## 2022-06-27 NOTE — Progress Notes (Signed)
Remote pacemaker transmission.   

## 2022-06-28 ENCOUNTER — Other Ambulatory Visit: Payer: Self-pay | Admitting: Cardiology

## 2022-07-31 ENCOUNTER — Other Ambulatory Visit: Payer: Self-pay | Admitting: Physician Assistant

## 2022-07-31 ENCOUNTER — Other Ambulatory Visit: Payer: Self-pay | Admitting: Cardiology

## 2022-07-31 DIAGNOSIS — M5430 Sciatica, unspecified side: Secondary | ICD-10-CM | POA: Diagnosis not present

## 2022-07-31 DIAGNOSIS — Z6837 Body mass index (BMI) 37.0-37.9, adult: Secondary | ICD-10-CM | POA: Diagnosis not present

## 2022-07-31 DIAGNOSIS — R300 Dysuria: Secondary | ICD-10-CM | POA: Diagnosis not present

## 2022-08-04 DIAGNOSIS — M5441 Lumbago with sciatica, right side: Secondary | ICD-10-CM | POA: Diagnosis not present

## 2022-08-04 DIAGNOSIS — M5137 Other intervertebral disc degeneration, lumbosacral region: Secondary | ICD-10-CM | POA: Diagnosis not present

## 2022-08-06 ENCOUNTER — Other Ambulatory Visit: Payer: Self-pay

## 2022-08-06 ENCOUNTER — Emergency Department (HOSPITAL_COMMUNITY): Payer: Medicare Other

## 2022-08-06 ENCOUNTER — Encounter (HOSPITAL_COMMUNITY): Payer: Self-pay

## 2022-08-06 ENCOUNTER — Emergency Department (HOSPITAL_COMMUNITY)
Admission: EM | Admit: 2022-08-06 | Discharge: 2022-08-06 | Disposition: A | Discharge status: Home / Self Care | Payer: Medicare Other | Attending: Emergency Medicine | Admitting: Emergency Medicine

## 2022-08-06 DIAGNOSIS — M5136 Other intervertebral disc degeneration, lumbar region: Secondary | ICD-10-CM | POA: Insufficient documentation

## 2022-08-06 DIAGNOSIS — M5441 Lumbago with sciatica, right side: Secondary | ICD-10-CM | POA: Diagnosis not present

## 2022-08-06 DIAGNOSIS — I4891 Unspecified atrial fibrillation: Secondary | ICD-10-CM | POA: Insufficient documentation

## 2022-08-06 DIAGNOSIS — R109 Unspecified abdominal pain: Secondary | ICD-10-CM | POA: Diagnosis not present

## 2022-08-06 DIAGNOSIS — I509 Heart failure, unspecified: Secondary | ICD-10-CM | POA: Insufficient documentation

## 2022-08-06 DIAGNOSIS — I110 Hypertensive heart disease with heart failure: Secondary | ICD-10-CM | POA: Insufficient documentation

## 2022-08-06 DIAGNOSIS — Q438 Other specified congenital malformations of intestine: Secondary | ICD-10-CM | POA: Diagnosis not present

## 2022-08-06 DIAGNOSIS — I2510 Atherosclerotic heart disease of native coronary artery without angina pectoris: Secondary | ICD-10-CM | POA: Diagnosis not present

## 2022-08-06 DIAGNOSIS — M47816 Spondylosis without myelopathy or radiculopathy, lumbar region: Secondary | ICD-10-CM | POA: Diagnosis not present

## 2022-08-06 DIAGNOSIS — M5431 Sciatica, right side: Secondary | ICD-10-CM

## 2022-08-06 DIAGNOSIS — M47817 Spondylosis without myelopathy or radiculopathy, lumbosacral region: Secondary | ICD-10-CM | POA: Diagnosis not present

## 2022-08-06 DIAGNOSIS — M545 Low back pain, unspecified: Secondary | ICD-10-CM | POA: Diagnosis not present

## 2022-08-06 DIAGNOSIS — M48061 Spinal stenosis, lumbar region without neurogenic claudication: Secondary | ICD-10-CM | POA: Diagnosis not present

## 2022-08-06 LAB — COMPREHENSIVE METABOLIC PANEL
ALT: 13 U/L (ref 0–44)
AST: 20 U/L (ref 15–41)
Albumin: 3.7 g/dL (ref 3.5–5.0)
Alkaline Phosphatase: 50 U/L (ref 38–126)
Anion gap: 7 (ref 5–15)
BUN: 17 mg/dL (ref 8–23)
CO2: 24 mmol/L (ref 22–32)
Calcium: 9 mg/dL (ref 8.9–10.3)
Chloride: 102 mmol/L (ref 98–111)
Creatinine, Ser: 0.77 mg/dL (ref 0.44–1.00)
GFR, Estimated: >60 mL/min (ref >60)
Glucose, Bld: 129 mg/dL — ABNORMAL HIGH (ref 70–99)
Potassium: 4.2 mmol/L (ref 3.5–5.1)
Sodium: 133 mmol/L — ABNORMAL LOW (ref 135–145)
Total Bilirubin: 1.8 mg/dL — ABNORMAL HIGH (ref 0.3–1.2)
Total Protein: 7 g/dL (ref 6.5–8.1)

## 2022-08-06 LAB — CBC WITH DIFFERENTIAL/PLATELET
Abs Immature Granulocytes: 0.07 10*3/uL (ref 0.00–0.07)
Basophils Absolute: 0.1 10*3/uL (ref 0.0–0.1)
Basophils Relative: 1 %
Eosinophils Absolute: 0.1 10*3/uL (ref 0.0–0.5)
Eosinophils Relative: 1 %
HCT: 42.1 % (ref 36.0–46.0)
Hemoglobin: 14 g/dL (ref 12.0–15.0)
Immature Granulocytes: 1 %
Lymphocytes Relative: 30 %
Lymphs Abs: 2.7 10*3/uL (ref 0.7–4.0)
MCH: 29.4 pg (ref 26.0–34.0)
MCHC: 33.3 g/dL (ref 30.0–36.0)
MCV: 88.4 fL (ref 80.0–100.0)
Monocytes Absolute: 0.9 10*3/uL (ref 0.1–1.0)
Monocytes Relative: 10 %
Neutro Abs: 5.2 10*3/uL (ref 1.7–7.7)
Neutrophils Relative %: 57 %
Platelets: 303 10*3/uL (ref 150–400)
RBC: 4.76 MIL/uL (ref 3.87–5.11)
RDW: 15 % (ref 11.5–15.5)
WBC: 9.1 10*3/uL (ref 4.0–10.5)
nRBC: 0 % (ref 0.0–0.2)

## 2022-08-06 LAB — URINALYSIS, ROUTINE W REFLEX MICROSCOPIC
Bilirubin Urine: NEGATIVE
Glucose, UA: NEGATIVE mg/dL
Hgb urine dipstick: NEGATIVE
Ketones, ur: NEGATIVE mg/dL
Leukocytes,Ua: NEGATIVE
Nitrite: NEGATIVE
Protein, ur: NEGATIVE mg/dL
Specific Gravity, Urine: 1.013 (ref 1.005–1.030)
pH: 6 (ref 5.0–8.0)

## 2022-08-06 LAB — LIPASE, BLOOD: Lipase: 36 U/L (ref 11–51)

## 2022-08-06 LAB — LACTIC ACID, PLASMA: Lactic Acid, Venous: 1.6 mmol/L (ref 0.5–1.9)

## 2022-08-06 MED ORDER — SODIUM CHLORIDE 0.9 % IV BOLUS
500.0000 mL | Freq: Once | INTRAVENOUS | Status: AC
  Administered 2022-08-06: 500 mL via INTRAVENOUS

## 2022-08-06 MED ORDER — OXYCODONE-ACETAMINOPHEN 5-325 MG PO TABS
1.0000 | ORAL_TABLET | Freq: Once | ORAL | Status: AC
  Administered 2022-08-06: 1 via ORAL
  Filled 2022-08-06: qty 1

## 2022-08-06 MED ORDER — OXYCODONE-ACETAMINOPHEN 5-325 MG PO TABS
1.0000 | ORAL_TABLET | Freq: Four times a day (QID) | ORAL | 0 refills | Status: DC | PRN
Start: 2022-08-06 — End: 2022-12-03

## 2022-08-06 MED ORDER — IOHEXOL 300 MG/ML  SOLN
100.0000 mL | Freq: Once | INTRAMUSCULAR | Status: AC | PRN
  Administered 2022-08-06: 100 mL via INTRAVENOUS

## 2022-08-06 MED ORDER — OXYCODONE-ACETAMINOPHEN 5-325 MG PO TABS
1.0000 | ORAL_TABLET | Freq: Four times a day (QID) | ORAL | 0 refills | Status: DC | PRN
Start: 2022-08-06 — End: 2023-01-31

## 2022-08-06 NOTE — Discharge Instructions (Signed)
You may alternate ice and heat to your lower back.  Avoid bending twisting or heavy lifting.  Keep your upcoming appointment with your orthopedic provider for later this week.  Your CT scan today shows that you have some constipation, recommend that you take MiraLAX as directed to help with this.  Pain medication can also cause some constipation.  Return to the emergency department for any new or worsening symptoms.

## 2022-08-06 NOTE — ED Triage Notes (Signed)
Pt reports she has been having problems with right side back pain radiating down her hip and leg.  Pt reports she received a steroid shot and it helped for 1-2 days and has been taking PO prednisone and baclofen but is not improved.

## 2022-08-07 MED FILL — Oxycodone w/ Acetaminophen Tab 5-325 MG: ORAL | Qty: 6 | Status: AC

## 2022-08-08 NOTE — ED Provider Notes (Signed)
[place] EMERGENCY DEPARTMENT AT [facility] Provider Note   CSN: [id number] Arrival date & time: 08/06/22  1358     History  Chief Complaint  Patient presents with   Hip Pain    [name] is a 89 y.o. female.   Hip Pain Pertinent negatives include no chest pain, no abdominal pain, no headaches and no shortness of breath.        [name] is a 89 y.o. female with past medical history of atrial fibrillation, CHF, coronary atherosclerosis, arthritis and hypertension who presents to the Emergency Department complaining of right-sided low back pain for several days.  She describes sharp stabbing pains that worsen with movement and radiating into her right hip and right leg.  She was seen at urgent care and received a steroid injection and a prescription for steroid tablets.  She endorses improvement of her symptoms for a few days but then pain has returned.  She is also taking a muscle relaxer without improvement.  She denies any weakness or numbness of her extremities, no flank pain, fever, chills, abdominal pain, urine or bowel changes, nausea or vomiting.  No known injury   Home Medications Prior to Admission medications   Medication Sig Start Date End Date Taking? Authorizing Provider  oxyCODONE-acetaminophen (PERCOCET/ROXICET) 5-325 MG tablet Take 1 tablet by mouth every 6 (six) hours as needed. 08/06/22  Yes Wesam Gearhart, PA-C  oxyCODONE-acetaminophen (PERCOCET/ROXICET) 5-325 MG tablet Take 1 tablet by mouth every 6 (six) hours as needed for severe pain. 08/06/22  Yes Janetta Vandoren, PA-C  atorvastatin (LIPITOR) 20 MG tablet TAKE 1 TABLET BY MOUTH AT BEDTIME 01/03/22   Jonelle Sidle, MD  balsalazide (COLAZAL) 750 MG capsule Take 3 capsules (2,250 mg total) by mouth 3 (three) times daily. 04/12/22   Dolores Frame, MD  bisoprolol (ZEBETA) 5 MG tablet TAKE 1/2 TABLET BY MOUTH DAILY 06/28/22   Jonelle Sidle, MD  Carboxymethylcellulose  Sodium (THERATEARS) 0.25 % SOLN Place 1 drop into both eyes in the morning and at bedtime.    [provider]  CRANBERRY PO Take 4,200 mg by mouth daily. With vit C    [provider]  dofetilide (TIKOSYN) 250 MCG capsule TAKE ONE CAPSULE BY MOUTH TWICE DAILY 07/31/22   Jonelle Sidle, MD  ENTRESTO 24-26 MG TAKE 1 TABLET BY MOUTH TWICE DAILY - start ENTRESTO 48 HOURS AFTER STOPPING LISINOPRIL 04/02/22   Jonelle Sidle, MD  furosemide (LASIX) 20 MG tablet TAKE 2 TABLETS BY MOUTH TWICE DAILY 05/30/22   Jonelle Sidle, MD  meclizine (ANTIVERT) 25 MG tablet Take 25 mg by mouth 3 (three) times daily as needed. 09/08/21   [provider]  mirabegron ER (MYRBETRIQ) 25 MG TB24 tablet Take 1 tablet (25 mg total) by mouth daily. 11/17/21   McKenzie, [name] Celeste, MD  Multiple Vitamins-Minerals (ICAPS AREDS 2 PO) Take 2 tablets by mouth daily.    [provider]  nitroGLYCERIN (NITROSTAT) 0.4 MG SL tablet Place 0.4 mg under the tongue every 5 (five) minutes as needed for chest pain.    [provider]  OVER THE COUNTER MEDICATION Vit D 3 25 mcg (1,000units) one tablet bid.    [provider]  potassium chloride SA (KLOR-CON M) 20 MEQ tablet TAKE 1 TABLET BY MOUTH TWICE DAILY 07/31/22   Sheilah Pigeon, PA-C  psyllium (METAMUCIL SMOOTH TEXTURE) 58.6 % powder Take 1 packet by mouth at bedtime. 04/11/21   Rehman,  Joline Maxcy, MD  sodium chloride (MURO 128) 2 % ophthalmic solution Place 1 drop into both eyes in the morning and at bedtime.    [provider]  spironolactone (ALDACTONE) 25 MG tablet TAKE 1/2 TABLET BY MOUTH EVERY DAY 01/03/22   Jonelle Sidle, MD  XARELTO 20 MG TABS tablet TAKE 1 TABLET BY MOUTH DAILY WITH SUPPER 04/02/22   Jonelle Sidle, MD      Allergies    Chlor-trimeton [chlorpheniramine], Carvedilol, Demerol, and Penicillins    Review of Systems   Review of Systems  Constitutional:  Negative for appetite change, chills  and fever.  Respiratory:  Negative for cough and shortness of breath.   Cardiovascular:  Negative for chest pain.  Gastrointestinal:  Negative for abdominal pain, nausea and vomiting.  Genitourinary:  Negative for decreased urine volume, difficulty urinating and dysuria.  Musculoskeletal:  Positive for back pain. Negative for neck pain.  Neurological:  Negative for dizziness, weakness, numbness and headaches.    Physical Exam Updated Vital Signs BP (!) 147/83   Pulse 65   Temp 98 F (36.7 C) (Oral)   Resp 18   Ht 5\' 3"  (1.6 m)   Wt 95.7 kg   SpO2 96%   BMI 37.38 kg/m  Physical Exam Vitals and nursing note reviewed.  Constitutional:      General: She is not in acute distress.    Appearance: Normal appearance. She is not ill-appearing or toxic-appearing.  HENT:     Mouth/Throat:     Mouth: Mucous membranes are moist.  Cardiovascular:     Rate and Rhythm: Normal rate and regular rhythm.     Pulses: Normal pulses.  Pulmonary:     Effort: Pulmonary effort is normal.  Chest:     Chest wall: No tenderness.  Abdominal:     Palpations: Abdomen is soft.     Tenderness: There is no abdominal tenderness. There is no right CVA tenderness or left CVA tenderness.  Musculoskeletal:        General: Tenderness present. No swelling, deformity or signs of injury.     Lumbar back: Tenderness present. No swelling or deformity. Positive right straight leg raise test. Negative left straight leg raise test.     Comments: Tenderness to palpation of the right lower lumbar paraspinal muscles and right SI joint space.  Patient has full range of motion of the bilateral hip flexors and extensors.  Neurological:     Mental Status: She is alert.     ED Results / Procedures / Treatments   Labs (all labs ordered are listed, but only abnormal results are displayed) Labs Reviewed  COMPREHENSIVE METABOLIC PANEL - Abnormal; Notable for the following components:      Result Value   Sodium 133 (*)     Glucose, Bld 129 (*)    Total Bilirubin 1.8 (*)    All other components within normal limits  CBC WITH DIFFERENTIAL/PLATELET  LIPASE, BLOOD  URINALYSIS, ROUTINE W REFLEX MICROSCOPIC  LACTIC ACID, PLASMA    EKG None  Radiology CT ABDOMEN PELVIS W CONTRAST  Result Date: 08/06/2022 CLINICAL DATA:  Abdominal pain, acute, nonlocalized EXAM: CT ABDOMEN AND PELVIS WITH CONTRAST TECHNIQUE: Multidetector CT imaging of the abdomen and pelvis was performed using the standard protocol following bolus administration of intravenous contrast. RADIATION DOSE REDUCTION: This exam was performed according to the departmental dose-optimization program which includes automated exposure control, adjustment of the mA and/or kV according to patient size and/or use of iterative  reconstruction technique. CONTRAST:  OMNIPAQUE IOHEXOL 300 MG/ML  SOLN COMPARISON:  CT scan abdomen and pelvis from 06/08/2019. FINDINGS: Lower chest: The lung bases are clear. No pleural effusion. The heart is normal in size. No pericardial effusion. Partially seen cardiac pacemaker leads. Hepatobiliary: The liver is normal in size. Non-cirrhotic configuration. No suspicious mass. There is a 1.0 x 1.4 cm subcapsular simple cyst in the left hepatic lobe, segment 4B. There are several additional sub 5 mm hypoattenuating foci throughout the liver, too small to adequately characterize. No intrahepatic or extrahepatic bile duct dilation. Small volume calcified gallstones noted. Normal gallbladder wall thickness. No pericholecystic inflammatory changes. Pancreas: No suspicious mass. There are at least 2 small areas of intrapancreatic lipoma in the head. No pancreatic ductal dilatation or surrounding inflammatory changes. Spleen: Within normal limits. No focal lesion. Adrenals/Urinary Tract: Adrenal glands are unremarkable. No suspicious renal mass. There are several simple cortical cysts in bilateral kidneys with largest arising from the right kidney  posteriorly measuring up to 1.7 x 1.8 cm. No hydronephrosis. Bilateral extrarenal pelvis noted. No renal or ureteric calculi. Unremarkable urinary bladder. Stomach/Bowel: There is a small sliding hiatal hernia. No disproportionate dilation of the small or large bowel loops. No evidence of abnormal bowel wall thickening or inflammatory changes. The appendix was not visualized; however there is no acute inflammatory process in the right lower quadrant. There is redundant cecum which lies in the right upper quadrant, anteriorly. Moderate stool burden noted. Vascular/Lymphatic: No ascites or pneumoperitoneum. No abdominal or pelvic lymphadenopathy, by size criteria. No aneurysmal dilation of the major abdominal arteries. There are moderate peripheral atherosclerotic vascular calcifications of the aorta and its major branches. Reproductive: The uterus is surgically absent. No large adnexal mass. Other: There is a tiny fat containing umbilical hernia. The soft tissues and abdominal wall are otherwise unremarkable. Musculoskeletal: No suspicious osseous lesions. There are mild multilevel degenerative changes in the visualized spine. IMPRESSION: 1. No acute inflammatory process identified within the abdomen or pelvis. 2. Multiple other nonacute observations, as described above. Electronically Signed   By: Jules Schick M.D.   On: 08/06/2022 19:13   CT L-SPINE NO CHARGE  Result Date: 08/06/2022 CLINICAL DATA:  Right-sided back pain EXAM: CT LUMBAR SPINE WITHOUT CONTRAST TECHNIQUE: Multidetector CT imaging of the lumbar spine was performed without intravenous contrast administration. Multiplanar CT image reconstructions were also generated. RADIATION DOSE REDUCTION: This exam was performed according to the departmental dose-optimization program which includes automated exposure control, adjustment of the mA and/or kV according to patient size and/or use of iterative reconstruction technique. COMPARISON:  None Available.  FINDINGS: Segmentation: 5 lumbar type vertebrae. Alignment: Grade 1 retrolisthesis at L1-2, L2-3 and L3-4. Grade 1 anterolisthesis at L4-5 and L5-S1. Vertebrae: No acute fracture or focal pathologic process. Paraspinal and other soft tissues: Calcific aortic atherosclerosis. Disc levels: Flowing anterior osteophytes extend from the visible thoracic spine to the L2 level. L2-3: Disc space narrowing and moderate facet arthrosis. No spinal canal stenosis. Moderate right and mild left foraminal stenosis. L3-4: Moderate facet arthrosis and small disc bulge with ligamentum flavum redundancy. Mild spinal canal stenosis. Mild right foraminal stenosis. L4-5: Severe facet arthrosis with intermediate sized disc bulge. Moderate spinal canal stenosis. Moderate bilateral foraminal stenosis. L5-S1: Severe facet arthrosis. No spinal canal stenosis. Moderate bilateral foraminal stenosis. IMPRESSION: 1. No acute fracture of the lumbar spine. 2. Degenerative disc disease and facet arthrosis causing multilevel spinal canal and neural foraminal stenosis. 3. Flowing anterior osteophytes extend from the visible  thoracic spine to the L2 level, consistent with diffuse idiopathic skeletal hyperostosis. Aortic Atherosclerosis (ICD10-I70.0). Electronically Signed   By: Deatra Robinson M.D.   On: 08/06/2022 19:06    Procedures Procedures    Medications Ordered in ED Medications  sodium chloride 0.9 % bolus 500 mL (0 mLs Intravenous Stopped 08/06/22 1700)  oxyCODONE-acetaminophen (PERCOCET/ROXICET) 5-325 MG per tablet 1 tablet (1 tablet Oral Given 08/06/22 1659)  iohexol (OMNIPAQUE) 300 MG/ML solution 100 mL (100 mLs Intravenous Contrast Given 08/06/22 1724)    ED Course/ Medical Decision Making/ A&P                                 Medical Decision Making Patient with history of arthritis here for right-sided low back pain that radiates into her right hip and leg.  Pain persistent for several days and constant but worsens with  movement especially standing or walking.  No known injury.  No fever or chills abdominal pain urine or bowel changes.  No saddle anesthesias  Differential would include but not limited to sciatica, acute on chronic pain, UTI/pyelonephritis, neoplasm, cauda equina, musculoskeletal  Amount and/or Complexity of Data Reviewed Labs: ordered.    Details: Labs interpreted by me, no evidence of leukocytosis, hemoglobin unremarkable, lactic acid unremarkable, chemistries without significant derangement, lipase also unremarkable urinalysis without evidence of hematuria or infection Radiology: ordered.    Details: CT abdomen and pelvis without evidence of acute inflammatory process of the abdomen or pelvis, no charge L-spine also ordered shows no acute fractures degenerative disc disease of multi level with anterior osteophytes at L2 Discussion of management or test interpretation with external provider(s): Patient here likely with sciatica.  No red flags suggestive of cauda equina or other emergent process.  On recheck, patient reports improvement of her pain after receiving pain medication here.  States she did not like how the prednisone made her feel, so I have recommended her to discontinue.  Will give short course of pain medication and recommendations for close outpatient follow-up .  Return precautions were also given  Risk Prescription drug management.           Final Clinical Impression(s) / ED Diagnoses Final diagnoses:  Sciatica of right side    Rx / DC Orders ED Discharge Orders          Ordered    oxyCODONE-acetaminophen (PERCOCET/ROXICET) 5-325 MG tablet  Every 6 hours PRN        08/06/22 1934    oxyCODONE-acetaminophen (PERCOCET/ROXICET) 5-325 MG tablet  Every 6 hours PRN        08/06/22 2008              Pauline Aus, PA-C 08/08/22 1417    Bethann Berkshire, MD 08/08/22 1454

## 2022-08-09 DIAGNOSIS — M5417 Radiculopathy, lumbosacral region: Secondary | ICD-10-CM | POA: Diagnosis not present

## 2022-08-09 DIAGNOSIS — M5137 Other intervertebral disc degeneration, lumbosacral region: Secondary | ICD-10-CM | POA: Diagnosis not present

## 2022-08-13 DIAGNOSIS — R7989 Other specified abnormal findings of blood chemistry: Secondary | ICD-10-CM | POA: Diagnosis not present

## 2022-08-14 DIAGNOSIS — M5430 Sciatica, unspecified side: Secondary | ICD-10-CM | POA: Diagnosis not present

## 2022-08-14 DIAGNOSIS — Z6837 Body mass index (BMI) 37.0-37.9, adult: Secondary | ICD-10-CM | POA: Diagnosis not present

## 2022-08-17 DIAGNOSIS — Z6837 Body mass index (BMI) 37.0-37.9, adult: Secondary | ICD-10-CM | POA: Diagnosis not present

## 2022-08-17 DIAGNOSIS — M4316 Spondylolisthesis, lumbar region: Secondary | ICD-10-CM | POA: Diagnosis not present

## 2022-08-17 DIAGNOSIS — M48061 Spinal stenosis, lumbar region without neurogenic claudication: Secondary | ICD-10-CM | POA: Diagnosis not present

## 2022-08-19 IMAGING — CR DG CHEST 2V
2 series · 2 of 2 positions shown · non-contrast
Comparison: 02/25/2017

CLINICAL DATA: Pacemaker

EXAM:
CHEST - 2 VIEW

[w chest pa]
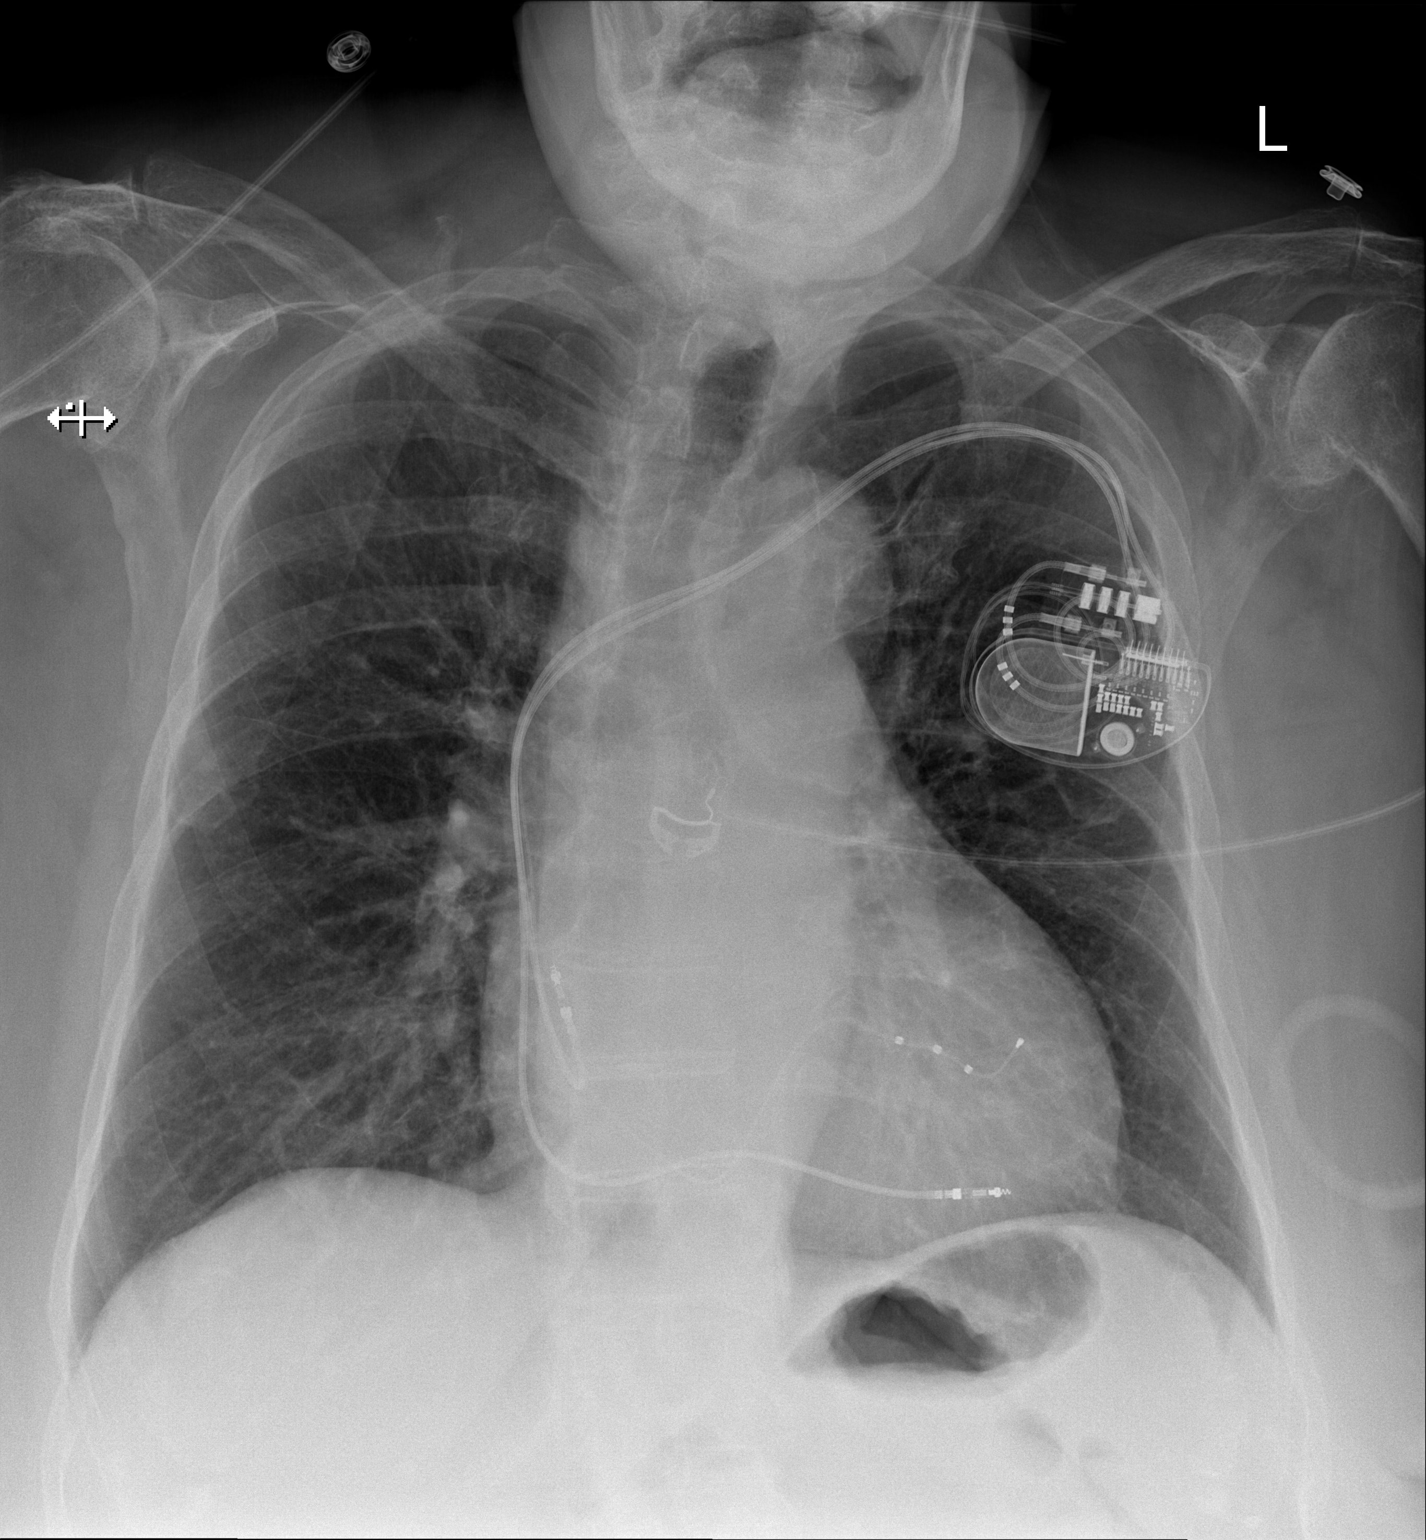

[w chest lat]
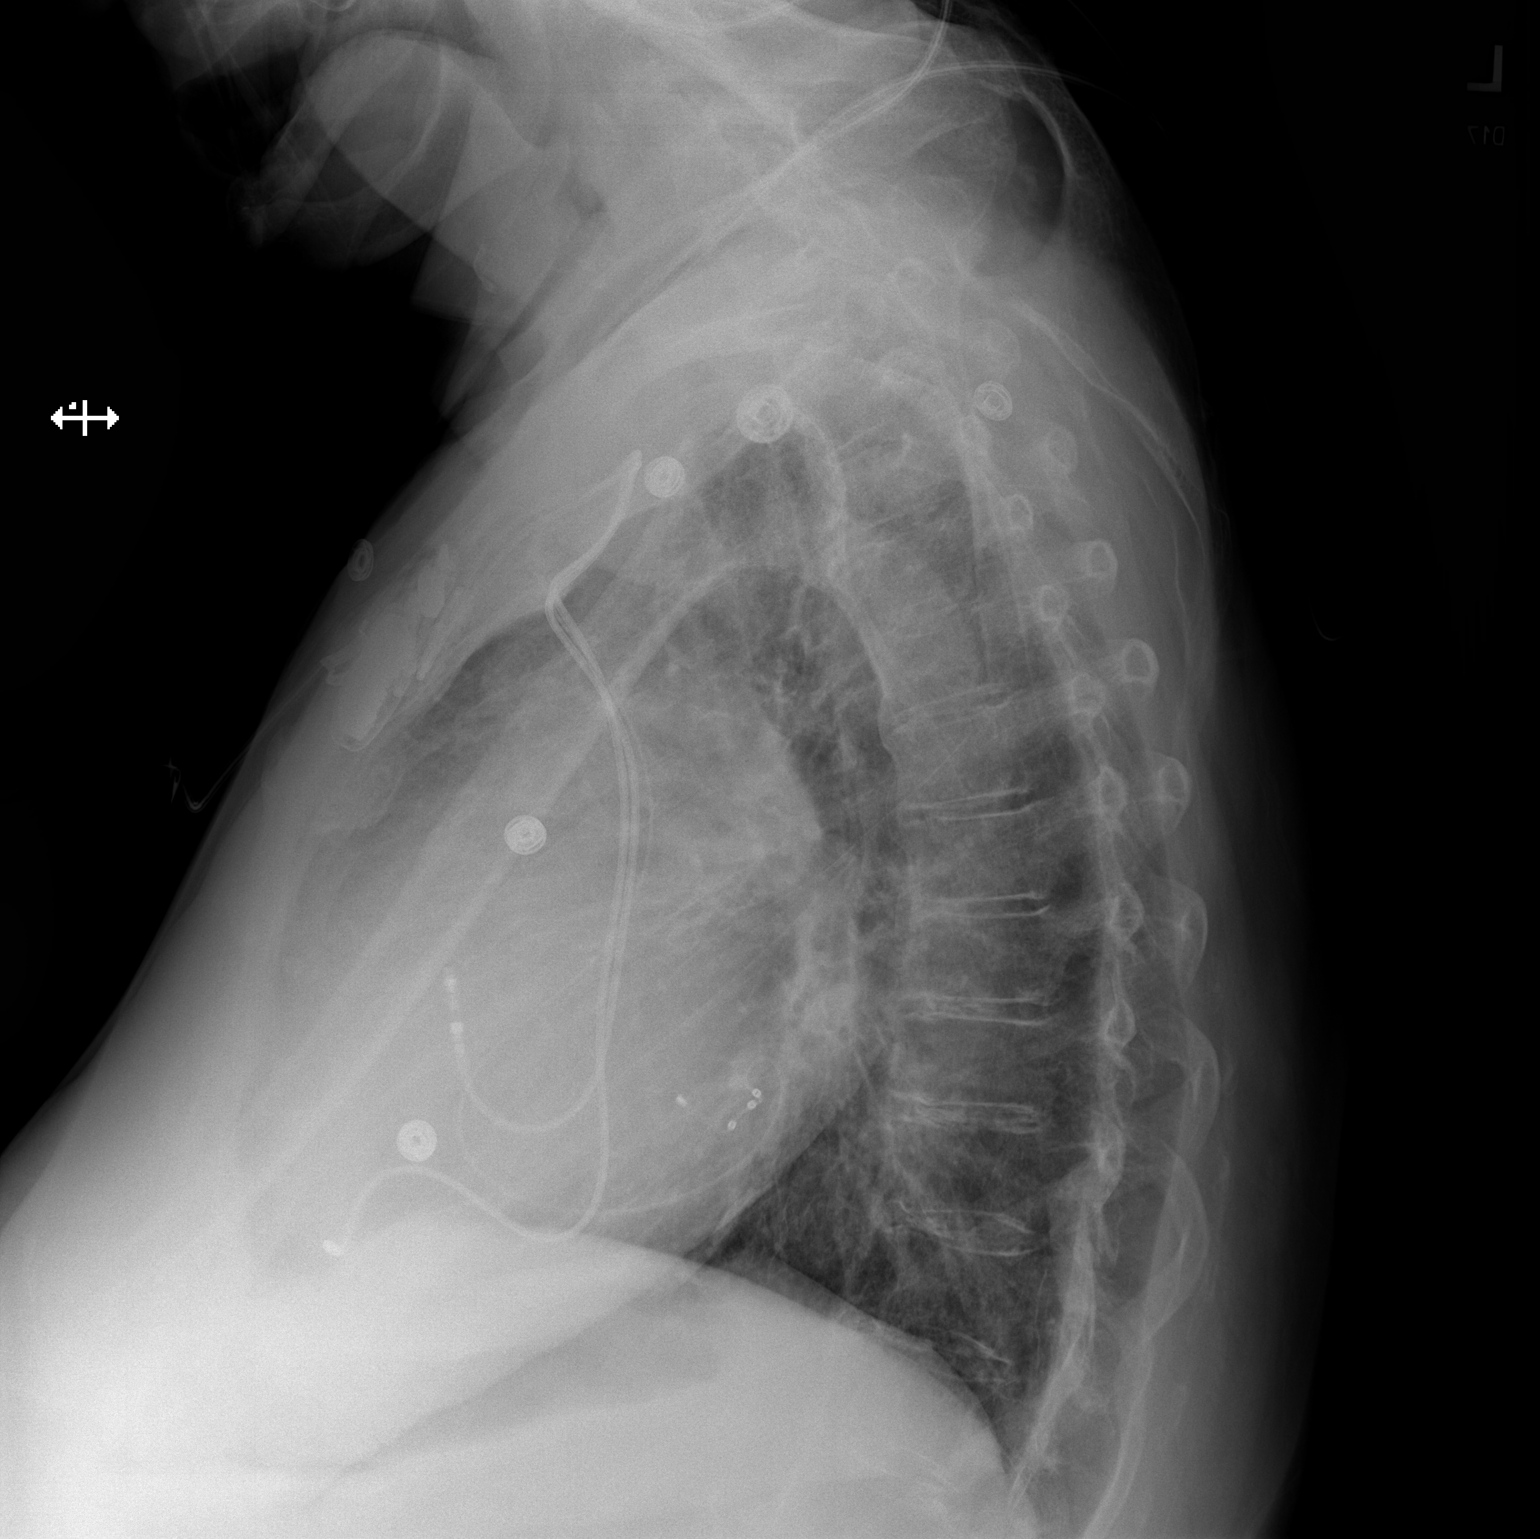

[2 of 2 positions shown; findings below may reference images not displayed]

FINDINGS: Pacemaker in place with radiate tree ule, right ventricular and
coronary sinus leads. No pneumothorax. Cardiomegaly and aortic
atherosclerosis as seen previously. Pulmonary vascularity is normal.
The lungs are clear. No effusions. Chronic degenerative changes
affect the shoulders.
IMPRESSION: Pacemaker grossly well positioned. No pneumothorax or other
complication.

## 2022-08-20 ENCOUNTER — Telehealth: Payer: Self-pay | Admitting: Cardiology

## 2022-08-20 NOTE — Telephone Encounter (Signed)
   Pre-operative Risk Assessment    Patient Name: [name]  DOB: 08-03-1930 MRN: [id number]      Request for Surgical Clearance    Procedure:   right - L4 -L5 TF ES1  Date of Surgery:  Clearance TBD                                 Surgeon:  Dr. Aileen Fass Surgeon's Group or Practice Name:  [facility] Phone number:  [phone] Fax number:  [phone]   Type of Clearance Requested:   - Medical  - Pharmacy:  Hold Rivaroxaban (Xarelto) Discontinue 3 days prior and resume day after.   Type of Anesthesia:  Not Indicated   Additional requests/questions:    Signed, Seymour Bars   08/20/2022, 3:22 PM

## 2022-08-20 NOTE — Telephone Encounter (Signed)
Patient with diagnosis of afib on Xarelto for anticoagulation.    Procedure: right - L4 -L5 TF ES1  Date of procedure: TBD   CHA2DS2-VASc Score = 6   This indicates a 9.7% annual risk of stroke. The patient's score is based upon: CHF History: 1 HTN History: 1 Diabetes History: 0 Stroke History: 0 Vascular Disease History: 1 Age Score: 2 Gender Score: 1      CrCl 53 ml/min Platelet count 303  Per office protocol, patient can hold Xarelto for 3 days prior to procedure.    **This guidance is not considered finalized until pre-operative APP has relayed final recommendations.**

## 2022-08-20 NOTE — Telephone Encounter (Signed)
   Name: [name]  DOB: [birth year]  MRN: [id number]  Primary Cardiologist: Nona Dell, MD  Chart reviewed as part of pre-operative protocol coverage. Because of Arsha Ostermeier Wiswell's past medical history and time since last visit, she will require a follow-up telephone visit in order to better assess preoperative cardiovascular risk.  Pre-op covering staff: - Please schedule appointment and call patient to inform them. If patient already had an upcoming appointment within acceptable timeframe, please add "pre-op clearance" to the appointment notes so provider is aware. - Please contact requesting surgeon's office via preferred method (i.e, phone, fax) to inform them of need for appointment prior to surgery.  Per office protocol, patient can hold Xarelto for 3 days prior to procedure.      Sharlene Dory, PA-C  08/20/2022, 4:43 PM

## 2022-08-21 ENCOUNTER — Telehealth: Payer: Self-pay | Admitting: *Deleted

## 2022-08-21 NOTE — Telephone Encounter (Signed)
Spoke with patient and scheduled her for a preop telehealth visit on 08/28/22 at 10:00 AM. Will route to requesting surgeons office to make them aware.

## 2022-08-21 NOTE — Telephone Encounter (Signed)
  Patient Consent for Virtual Visit        [name] has provided verbal consent on 08/21/2022 for a virtual visit (video or telephone).   CONSENT FOR VIRTUAL VISIT FOR:  [name]  By participating in this virtual visit I agree to the following:  I hereby voluntarily request, consent and authorize [facility] and its employed or contracted physicians, physician assistants, nurse practitioners or other licensed health care professionals (the Practitioner), to provide me with telemedicine health care services (the "Services") as deemed necessary by the treating Practitioner. I acknowledge and consent to receive the Services by the Practitioner via telemedicine. I understand that the telemedicine visit will involve communicating with the Practitioner through live audiovisual communication technology and the disclosure of certain medical information by electronic transmission. I acknowledge that I have been given the opportunity to request an in-person assessment or other available alternative prior to the telemedicine visit and am voluntarily participating in the telemedicine visit.  I understand that I have the right to withhold or withdraw my consent to the use of telemedicine in the course of my care at any time, without affecting my right to future care or treatment, and that the Practitioner or I may terminate the telemedicine visit at any time. I understand that I have the right to inspect all information obtained and/or recorded in the course of the telemedicine visit and may receive copies of available information for a reasonable fee.  I understand that some of the potential risks of receiving the Services via telemedicine include:  Delay or interruption in medical evaluation due to technological equipment failure or disruption; Information transmitted may not be sufficient (e.g. poor resolution of images) to allow for appropriate medical decision making by the  Practitioner; and/or  In rare instances, security protocols could fail, causing a breach of personal health information.  Furthermore, I acknowledge that it is my responsibility to provide information about my medical history, conditions and care that is complete and accurate to the best of my ability. I acknowledge that Practitioner's advice, recommendations, and/or decision may be based on factors not within their control, such as incomplete or inaccurate data provided by me or distortions of diagnostic images or specimens that may result from electronic transmissions. I understand that the practice of medicine is not an exact science and that Practitioner makes no warranties or guarantees regarding treatment outcomes. I acknowledge that a copy of this consent can be made available to me via my patient portal [facility] MyChart), or I can request a printed copy by calling the office of  [facility].    I understand that my insurance will be billed for this visit.   I have read or had this consent read to me. I understand the contents of this consent, which adequately explains the benefits and risks of the Services being provided via telemedicine.  I have been provided ample opportunity to ask questions regarding this consent and the Services and have had my questions answered to my satisfaction. I give my informed consent for the services to be provided through the use of telemedicine in my medical care

## 2022-08-23 ENCOUNTER — Telehealth: Payer: Self-pay | Admitting: Cardiovascular Disease

## 2022-08-23 NOTE — Telephone Encounter (Signed)
  1. Has your device fired? no  2. Is you device beeping? no  3. Are you experiencing draining or swelling at device site? no  4. Are you calling to see if we received your device transmission? Yes- patient said she received a call, she says her device is now working. She wants to know if you have received the transmission?  5. Have you passed out? no   Please route to Device Clinic Pool

## 2022-08-23 NOTE — Telephone Encounter (Signed)
Per Pt she received a phone call advising her that her remote monitor was not working.  No phone call documented in Epic or P[employer].  Walked Pt through sending a transmission.  Transmission received.

## 2022-08-27 DIAGNOSIS — M5430 Sciatica, unspecified side: Secondary | ICD-10-CM | POA: Diagnosis not present

## 2022-08-27 DIAGNOSIS — Z0001 Encounter for general adult medical examination with abnormal findings: Secondary | ICD-10-CM | POA: Diagnosis not present

## 2022-08-27 DIAGNOSIS — I509 Heart failure, unspecified: Secondary | ICD-10-CM | POA: Diagnosis not present

## 2022-08-27 DIAGNOSIS — I959 Hypotension, unspecified: Secondary | ICD-10-CM | POA: Diagnosis not present

## 2022-08-27 NOTE — Progress Notes (Unsigned)
Virtual Visit via Telephone Note   Because of [name]'s co-morbid illnesses, she is at least at moderate risk for complications without adequate follow up.  This format is felt to be most appropriate for this patient at this time.  The patient did not have access to video technology/had technical difficulties with video requiring transitioning to audio format only (telephone).  All issues noted in this document were discussed and addressed.  No physical exam could be performed with this format.  Please refer to the patient's chart for her consent to telehealth for [facility].  Evaluation Performed:  Preoperative cardiovascular risk assessment _____________   Date:  08/27/2022   Patient ID:  [name], DOB 04/05/1930, MRN [id number] Patient Location:  Home Provider location:   Office  Primary Care Provider:  Donetta Potts, MD Primary Cardiologist:  Nona Dell, MD  Chief Complaint / Patient Profile   89 y.o. y/o female with a h/o coronary atherosclerosis, hypertension, atrial flutter who is pending right L4-L5 TFESI and presents today for telephonic preoperative cardiovascular risk assessment.  History of Present Illness    [name] is a 89 y.o. female who presents via audio/video conferencing for a telehealth visit today.  Pt was last seen in cardiology clinic on 04/27/2022 by Sharlene Dory, NP.  At that time [name] was doing well .  The patient is now pending procedure as outlined above. Since her last visit, she continues to be stable from a cardiac standpoint.  Today she denies chest pain, shortness of breath, lower extremity edema, fatigue, palpitations, melena, hematuria, hemoptysis, diaphoresis, weakness, presyncope, syncope, orthopnea, and PND.   Past Medical History    Past Medical History:  Diagnosis Date   Arthritis    Atrial fibrillation [facility])    Atrial flutter (HCC)    Bladder cancer (HCC)    Cardiomyopathy  (HCC)    Coronary atherosclerosis of native coronary artery    a. BMS RCA May 2014 - [place] stent. b. Cath 04/2017 - patent stent, minimal CAD otherwise.   Depression    Essential hypertension    GI bleeding 06/2017   a. melena/small bowel ulcer by capsule endo 06/2017.   Hyperlipemia    Macular degeneration    NICM (nonischemic cardiomyopathy) (HCC)    Persistent atrial fibrillation (HCC)    Presence of permanent cardiac pacemaker    Ulcerative colitis    Past Surgical History:  Procedure Laterality Date   ABDOMINAL HYSTERECTOMY     APPENDECTOMY     Bilateral knee replacements      2007, 2008   BIV PACEMAKER INSERTION CRT-P N/A 03/02/2020   Procedure: BIV PACEMAKER INSERTION CRT-P;  Surgeon: Regan Lemming, MD;  Location: [facility] INVASIVE CV LAB;  Service: Cardiovascular;  Laterality: N/A;   BLADDER SURGERY     CARDIOVERSION N/A 02/04/2017   Procedure: CARDIOVERSION;  Surgeon: Antoine Poche, MD;  Location: [facility] ENDO SUITE;  Service: Endoscopy;  Laterality: N/A;   CARDIOVERSION N/A 02/26/2017   Procedure: CARDIOVERSION;  Surgeon: Jonelle Sidle, MD;  Location: [facility] ORS;  Service: Cardiovascular;  Laterality: N/A;   CARDIOVERSION N/A 08/07/2017   Procedure: CARDIOVERSION;  Surgeon: Chrystie Nose, MD;  Location: [facility] ENDOSCOPY;  Service: Cardiovascular;  Laterality: N/A;   COLONOSCOPY N/A 06/15/2015   Procedure: COLONOSCOPY;  Surgeon: Malissa Hippo, MD;  Location: [facility] ENDO SUITE;  Service: Endoscopy;  Laterality: N/A;  210   CYSTOSCOPY N/A 11/07/2020   Procedure: CYSTOSCOPY;  Surgeon:  McKenzie, [name] Celeste, MD;  Location: [facility] ORS;  Service: Urology;  Laterality: N/A;   CYSTOSCOPY W/ RETROGRADES Bilateral 05/14/2016   Procedure: CYSTOSCOPY WITH BILATERAL RETROGRADE PYELOGRAM;  Surgeon: Heloise Purpura, MD;  Location: [facility] ORS;  Service: Urology;  Laterality: Bilateral;  GENERAL ANESTHESIA WITH PARALYSIS   ESOPHAGOGASTRODUODENOSCOPY (EGD) WITH PROPOFOL N/A 06/14/2017   Procedure:  ESOPHAGOGASTRODUODENOSCOPY (EGD) WITH PROPOFOL;  Surgeon: Malissa Hippo, MD;  Location: [facility];  Service: Endoscopy;  Laterality: N/A;   GIVENS CAPSULE STUDY  06/14/2017   Procedure: GIVENS CAPSULE STUDY;  Surgeon: Malissa Hippo, MD;  [facility]: [facility];  Service: Endoscopy;;   LEFT HEART CATHETERIZATION WITH CORONARY ANGIOGRAM N/A 10/30/2013   Procedure: LEFT HEART CATHETERIZATION WITH CORONARY ANGIOGRAM;  Surgeon: Kathleene Hazel, MD;  Location: [facility] CATH LAB;  Service: Cardiovascular;  Laterality: N/A;   RIGHT/LEFT HEART CATH AND CORONARY ANGIOGRAPHY N/A 05/03/2017   Procedure: RIGHT/LEFT HEART CATH AND CORONARY ANGIOGRAPHY;  Surgeon: Marykay Lex, MD;  Location: [facility] INVASIVE CV LAB;  Service: Cardiovascular;  Laterality: N/A;   TEE WITHOUT CARDIOVERSION N/A 02/04/2017   Procedure: TRANSESOPHAGEAL ECHOCARDIOGRAM (TEE) WITH PROPOL;  Surgeon: Antoine Poche, MD;  [facility];  Service: Endoscopy;  Laterality: N/A;   TONSILLECTOMY     TOTAL KNEE ARTHROPLASTY     TRANSURETHRAL RESECTION OF BLADDER TUMOR N/A 05/14/2016   Procedure: TRANSURETHRAL RESECTION OF BLADDER TUMOR (TURBT);  Surgeon: Heloise Purpura, MD;  Location: [facility] ORS;  Service: Urology;  Laterality: N/A;  GENERAL ANESTHESIA WITH PARALYSIS   TRANSURETHRAL RESECTION OF BLADDER TUMOR N/A 11/07/2020   Procedure: TRANSURETHRAL RESECTION OF BLADDER TUMOR (TURBT);  Surgeon: Malen Gauze, MD;  Location: [facility] ORS;  Service: Urology;  Laterality: N/A;   YAG LASER APPLICATION Bilateral 11/10/2012   Procedure: YAG LASER APPLICATION;  Surgeon: Susa Simmonds, MD;  Location: [facility] ORS;  Service: Ophthalmology;  Laterality: Bilateral;    Allergies  Allergies  Allergen Reactions   Chlor-Trimeton [Chlorpheniramine] Shortness Of Breath   Carvedilol Itching and Rash    Facial rash/itching   Demerol Rash   Penicillins Rash and Other (See Comments)    REACTION: rash, years ago Has patient had a PCN reaction  causing immediate rash, facial/tongue/throat swelling, SOB or lightheadedness with hypotension: Yes Has patient had a PCN reaction causing severe rash involving mucus membranes or skin necrosis: No Has patient had a PCN reaction that required hospitalization No Has patient had a PCN reaction occurring within the last 10 years: No If all of the above answers are "NO", then may proceed with Cephalosporin use.     Home Medications    Prior to Admission medications   Medication Sig Start Date End Date Taking? Authorizing Provider  atorvastatin (LIPITOR) 20 MG tablet TAKE 1 TABLET BY MOUTH AT BEDTIME 01/03/22   Jonelle Sidle, MD  balsalazide (COLAZAL) 750 MG capsule Take 3 capsules (2,250 mg total) by mouth 3 (three) times daily. 04/12/22   Dolores Frame, MD  bisoprolol (ZEBETA) 5 MG tablet TAKE 1/2 TABLET BY MOUTH DAILY 06/28/22   Jonelle Sidle, MD  Carboxymethylcellulose Sodium (THERATEARS) 0.25 % SOLN Place 1 drop into both eyes in the morning and at bedtime.    [provider]  CRANBERRY PO Take 4,200 mg by mouth daily. With vit C    [provider]  dofetilide (TIKOSYN) 250 MCG capsule TAKE ONE CAPSULE BY MOUTH TWICE DAILY 07/31/22   Jonelle Sidle, MD  ENTRESTO 24-26 MG TAKE  1 TABLET BY MOUTH TWICE DAILY - start ENTRESTO 48 HOURS AFTER STOPPING LISINOPRIL 04/02/22   Jonelle Sidle, MD  furosemide (LASIX) 20 MG tablet TAKE 2 TABLETS BY MOUTH TWICE DAILY 05/30/22   Jonelle Sidle, MD  meclizine (ANTIVERT) 25 MG tablet Take 25 mg by mouth 3 (three) times daily as needed. 09/08/21   [provider]  mirabegron ER (MYRBETRIQ) 25 MG TB24 tablet Take 1 tablet (25 mg total) by mouth daily. 11/17/21   McKenzie, [name] Celeste, MD  Multiple Vitamins-Minerals (ICAPS AREDS 2 PO) Take 2 tablets by mouth daily.    [provider]  nitroGLYCERIN (NITROSTAT) 0.4 MG SL tablet Place 0.4 mg under the tongue every 5 (five) minutes as needed for chest pain.     [provider]  OVER THE COUNTER MEDICATION Vit D 3 25 mcg (1,000units) one tablet bid.    [provider]  oxyCODONE-acetaminophen (PERCOCET/ROXICET) 5-325 MG tablet Take 1 tablet by mouth every 6 (six) hours as needed. 08/06/22   Triplett, Tammy, PA-C  oxyCODONE-acetaminophen (PERCOCET/ROXICET) 5-325 MG tablet Take 1 tablet by mouth every 6 (six) hours as needed for severe pain. 08/06/22   Triplett, Tammy, PA-C  potassium chloride SA (KLOR-CON M) 20 MEQ tablet TAKE 1 TABLET BY MOUTH TWICE DAILY 07/31/22   Sheilah Pigeon, PA-C  psyllium (METAMUCIL SMOOTH TEXTURE) 58.6 % powder Take 1 packet by mouth at bedtime. 04/11/21   Rehman, Joline Maxcy, MD  sodium chloride (MURO 128) 2 % ophthalmic solution Place 1 drop into both eyes in the morning and at bedtime.    [provider]  spironolactone (ALDACTONE) 25 MG tablet TAKE 1/2 TABLET BY MOUTH EVERY DAY 01/03/22   Jonelle Sidle, MD  XARELTO 20 MG TABS tablet TAKE 1 TABLET BY MOUTH DAILY WITH SUPPER 04/02/22   Jonelle Sidle, MD    Physical Exam    Vital Signs:  OREDA MADL does not have vital signs available for review today.  Given telephonic nature of communication, physical exam is limited. AAOx3. NAD. Normal affect.  Speech and respirations are unlabored.  Accessory Clinical Findings    None  Assessment & Plan    1.  Preoperative Cardiovascular Risk Assessment: [facility], Dr. Lorrine Kin, fax #[phone]      Primary Cardiologist: Nona Dell, MD  Chart reviewed as part of pre-operative protocol coverage. Given past medical history and time since last visit, based on ACC/AHA guidelines, REEVES ROSEBORO would be at acceptable risk for the planned procedure without further cardiovascular testing.   Patient was advised that if he develops new symptoms prior to surgery to contact our office to arrange a follow-up appointment.  He verbalized understanding.  Patient with diagnosis of afib on Xarelto for  anticoagulation.     Procedure: right - L4 -L5 TF ES1  Date of procedure: TBD     CHA2DS2-VASc Score = 6   This indicates a 9.7% annual risk of stroke. The patient's score is based upon: CHF History: 1 HTN History: 1 Diabetes History: 0 Stroke History: 0 Vascular Disease History: 1 Age Score: 2 Gender Score: 1       CrCl 53 ml/min Platelet count 303   Per office protocol, patient can hold Xarelto for 3 days prior to procedure.  I will route this recommendation to the requesting party via Epic fax function and remove from pre-op pool.  Please call with questions.       Time:   Today, I have spent 7  minutes with the patient with telehealth technology discussing medical history, symptoms, and management plan.  Prior to her phone evaluation I spent greater than 10 minutes reviewing her past medical history and cardiac medications.   Ronney Asters, NP  08/27/2022, 4:54 PM\

## 2022-08-28 ENCOUNTER — Ambulatory Visit: Payer: Medicare Other | Attending: Cardiology

## 2022-08-28 DIAGNOSIS — Z01810 Encounter for preprocedural cardiovascular examination: Secondary | ICD-10-CM | POA: Diagnosis not present

## 2022-08-29 DIAGNOSIS — M47816 Spondylosis without myelopathy or radiculopathy, lumbar region: Secondary | ICD-10-CM | POA: Diagnosis not present

## 2022-08-29 DIAGNOSIS — N6321 Unspecified lump in the left breast, upper outer quadrant: Secondary | ICD-10-CM | POA: Diagnosis not present

## 2022-08-29 DIAGNOSIS — N6311 Unspecified lump in the right breast, upper outer quadrant: Secondary | ICD-10-CM | POA: Diagnosis not present

## 2022-08-29 DIAGNOSIS — R92323 Mammographic fibroglandular density, bilateral breasts: Secondary | ICD-10-CM | POA: Diagnosis not present

## 2022-08-29 DIAGNOSIS — M9903 Segmental and somatic dysfunction of lumbar region: Secondary | ICD-10-CM | POA: Diagnosis not present

## 2022-08-29 DIAGNOSIS — N644 Mastodynia: Secondary | ICD-10-CM | POA: Diagnosis not present

## 2022-08-29 DIAGNOSIS — M5441 Lumbago with sciatica, right side: Secondary | ICD-10-CM | POA: Diagnosis not present

## 2022-08-31 DIAGNOSIS — M47816 Spondylosis without myelopathy or radiculopathy, lumbar region: Secondary | ICD-10-CM | POA: Diagnosis not present

## 2022-08-31 DIAGNOSIS — M5441 Lumbago with sciatica, right side: Secondary | ICD-10-CM | POA: Diagnosis not present

## 2022-08-31 DIAGNOSIS — M9903 Segmental and somatic dysfunction of lumbar region: Secondary | ICD-10-CM | POA: Diagnosis not present

## 2022-09-03 DIAGNOSIS — M9903 Segmental and somatic dysfunction of lumbar region: Secondary | ICD-10-CM | POA: Diagnosis not present

## 2022-09-03 DIAGNOSIS — M47816 Spondylosis without myelopathy or radiculopathy, lumbar region: Secondary | ICD-10-CM | POA: Diagnosis not present

## 2022-09-03 DIAGNOSIS — M5441 Lumbago with sciatica, right side: Secondary | ICD-10-CM | POA: Diagnosis not present

## 2022-09-04 ENCOUNTER — Ambulatory Visit (INDEPENDENT_AMBULATORY_CARE_PROVIDER_SITE_OTHER): Payer: Medicare Other

## 2022-09-04 DIAGNOSIS — I428 Other cardiomyopathies: Secondary | ICD-10-CM | POA: Diagnosis not present

## 2022-09-04 LAB — CUP PACEART REMOTE DEVICE CHECK
Battery Remaining Longevity: 52 mo
Battery Remaining Percentage: 62 %
Battery Voltage: 2.98 V
Brady Statistic AP VP Percent: 81 %
Brady Statistic AP VS Percent: 1 %
Brady Statistic AS VP Percent: 19 %
Brady Statistic AS VS Percent: 1 %
Brady Statistic RA Percent Paced: 66 %
Date Time Interrogation Session: 20240827020017
Implantable Lead Connection Status: 753985
Implantable Lead Connection Status: 753985
Implantable Lead Connection Status: 753985
Implantable Lead Implant Date: 20220223
Implantable Lead Implant Date: 20220223
Implantable Lead Implant Date: 20220223
Implantable Lead Location: 753858
Implantable Lead Location: 753859
Implantable Lead Location: 753860
Implantable Pulse Generator Implant Date: 20220223
Lead Channel Impedance Value: 540 Ohm
Lead Channel Impedance Value: 640 Ohm
Lead Channel Impedance Value: 730 Ohm
Lead Channel Pacing Threshold Amplitude: 0.5 V
Lead Channel Pacing Threshold Amplitude: 1.25 V
Lead Channel Pacing Threshold Amplitude: 1.25 V
Lead Channel Pacing Threshold Pulse Width: 0.5 ms
Lead Channel Pacing Threshold Pulse Width: 0.5 ms
Lead Channel Pacing Threshold Pulse Width: 0.8 ms
Lead Channel Sensing Intrinsic Amplitude: 3.2 mV
Lead Channel Sensing Intrinsic Amplitude: 9.3 mV
Lead Channel Setting Pacing Amplitude: 2.25 V
Lead Channel Setting Pacing Amplitude: 2.5 V
Lead Channel Setting Pacing Amplitude: 2.5 V
Lead Channel Setting Pacing Pulse Width: 0.5 ms
Lead Channel Setting Pacing Pulse Width: 0.8 ms
Lead Channel Setting Sensing Sensitivity: 2 mV
Pulse Gen Model: 3562
Pulse Gen Serial Number: 3861599

## 2022-09-05 DIAGNOSIS — N6321 Unspecified lump in the left breast, upper outer quadrant: Secondary | ICD-10-CM | POA: Diagnosis not present

## 2022-09-05 DIAGNOSIS — C50412 Malignant neoplasm of upper-outer quadrant of left female breast: Secondary | ICD-10-CM | POA: Diagnosis not present

## 2022-09-07 DIAGNOSIS — M47816 Spondylosis without myelopathy or radiculopathy, lumbar region: Secondary | ICD-10-CM | POA: Diagnosis not present

## 2022-09-07 DIAGNOSIS — M5441 Lumbago with sciatica, right side: Secondary | ICD-10-CM | POA: Diagnosis not present

## 2022-09-07 DIAGNOSIS — M9903 Segmental and somatic dysfunction of lumbar region: Secondary | ICD-10-CM | POA: Diagnosis not present

## 2022-09-12 DIAGNOSIS — M47816 Spondylosis without myelopathy or radiculopathy, lumbar region: Secondary | ICD-10-CM | POA: Diagnosis not present

## 2022-09-12 DIAGNOSIS — M9903 Segmental and somatic dysfunction of lumbar region: Secondary | ICD-10-CM | POA: Diagnosis not present

## 2022-09-12 DIAGNOSIS — M5441 Lumbago with sciatica, right side: Secondary | ICD-10-CM | POA: Diagnosis not present

## 2022-09-14 DIAGNOSIS — M5441 Lumbago with sciatica, right side: Secondary | ICD-10-CM | POA: Diagnosis not present

## 2022-09-14 DIAGNOSIS — M9903 Segmental and somatic dysfunction of lumbar region: Secondary | ICD-10-CM | POA: Diagnosis not present

## 2022-09-14 DIAGNOSIS — M47816 Spondylosis without myelopathy or radiculopathy, lumbar region: Secondary | ICD-10-CM | POA: Diagnosis not present

## 2022-09-17 DIAGNOSIS — Z170 Estrogen receptor positive status [ER+]: Secondary | ICD-10-CM | POA: Diagnosis not present

## 2022-09-17 DIAGNOSIS — C50412 Malignant neoplasm of upper-outer quadrant of left female breast: Secondary | ICD-10-CM | POA: Diagnosis not present

## 2022-09-17 DIAGNOSIS — D72829 Elevated white blood cell count, unspecified: Secondary | ICD-10-CM | POA: Diagnosis not present

## 2022-09-17 DIAGNOSIS — Z780 Asymptomatic menopausal state: Secondary | ICD-10-CM | POA: Diagnosis not present

## 2022-09-17 DIAGNOSIS — C50919 Malignant neoplasm of unspecified site of unspecified female breast: Secondary | ICD-10-CM | POA: Diagnosis not present

## 2022-09-17 NOTE — Progress Notes (Signed)
Remote pacemaker transmission.   

## 2022-09-21 DIAGNOSIS — M5416 Radiculopathy, lumbar region: Secondary | ICD-10-CM | POA: Diagnosis not present

## 2022-09-25 ENCOUNTER — Telehealth: Payer: Self-pay

## 2022-09-25 DIAGNOSIS — C50919 Malignant neoplasm of unspecified site of unspecified female breast: Secondary | ICD-10-CM | POA: Diagnosis not present

## 2022-09-25 DIAGNOSIS — C50412 Malignant neoplasm of upper-outer quadrant of left female breast: Secondary | ICD-10-CM | POA: Diagnosis not present

## 2022-09-25 DIAGNOSIS — Z170 Estrogen receptor positive status [ER+]: Secondary | ICD-10-CM | POA: Diagnosis not present

## 2022-09-25 NOTE — Telephone Encounter (Signed)
Pt has an in-office appt scheduled with dr. Diona Browner on 10/02/52. I will update appt notes and send clearance to him.

## 2022-09-25 NOTE — Telephone Encounter (Signed)
Pre-operative Risk Assessment    Patient Name: [name]  DOB: May 11, 1930 MRN: [id number]   Last OV 08/28/22 tele with JC. Next OV  10/03/22 with Dr. Diona Browner.   Request for Surgical Clearance    Procedure:   Left Breast Lumpectomy  Date of Surgery:  Clearance 10/17/22                                 Surgeon:  Dr. [place] Callas Group or Practice Name:  [facility] Phone number:  [phone] Fax number:  [phone]   Type of Clearance Requested:   - Medical  - Pharmacy:  Hold Rivaroxaban (Xarelto) pt will need hold instructions   Type of Anesthesia:  Not Indicated   Additional requests/questions:    Wynetta Fines   09/25/2022, 3:55 PM

## 2022-09-26 ENCOUNTER — Other Ambulatory Visit: Payer: Self-pay | Admitting: Cardiology

## 2022-09-26 DIAGNOSIS — Z170 Estrogen receptor positive status [ER+]: Secondary | ICD-10-CM | POA: Diagnosis not present

## 2022-09-26 DIAGNOSIS — C50919 Malignant neoplasm of unspecified site of unspecified female breast: Secondary | ICD-10-CM | POA: Diagnosis not present

## 2022-09-26 DIAGNOSIS — M85832 Other specified disorders of bone density and structure, left forearm: Secondary | ICD-10-CM | POA: Diagnosis not present

## 2022-09-26 DIAGNOSIS — Z780 Asymptomatic menopausal state: Secondary | ICD-10-CM | POA: Diagnosis not present

## 2022-09-26 DIAGNOSIS — C50412 Malignant neoplasm of upper-outer quadrant of left female breast: Secondary | ICD-10-CM | POA: Diagnosis not present

## 2022-09-26 NOTE — Telephone Encounter (Signed)
Prescription refill request for Xarelto received.   Indication: aflutter  Last office visit: [name] 04/27/2022 Weight: 95.7 kg  Age: 89 yo  Scr: 0.77, 08/06/2022 CrCl: 73 ml/min   Refill sent.

## 2022-10-03 ENCOUNTER — Ambulatory Visit: Payer: Medicare Other | Attending: Cardiology | Admitting: Cardiology

## 2022-10-03 ENCOUNTER — Encounter: Payer: Self-pay | Admitting: Cardiology

## 2022-10-03 VITALS — BP 118/68 | HR 60 | Ht 61.0 in | Wt 208.2 lb

## 2022-10-03 DIAGNOSIS — I428 Other cardiomyopathies: Secondary | ICD-10-CM

## 2022-10-03 DIAGNOSIS — I4819 Other persistent atrial fibrillation: Secondary | ICD-10-CM

## 2022-10-03 DIAGNOSIS — I25119 Atherosclerotic heart disease of native coronary artery with unspecified angina pectoris: Secondary | ICD-10-CM

## 2022-10-03 DIAGNOSIS — Z01810 Encounter for preprocedural cardiovascular examination: Secondary | ICD-10-CM

## 2022-10-03 NOTE — Progress Notes (Signed)
Cardiology Office Note  Date: 10/03/2022   ID: [name], [name] January 03, 1931, MRN [id number]  History of Present Illness: [name] is a 89 y.o. female last seen in April by Ms. Philis Nettle NP, I reviewed the note.  She presents for follow-up, also scheduled for left breast lumpectomy with Dr. Aurea Graff at [facility].  Anesthesia still to be determined, but possibly local anesthesia and sedation or LMA..  Request to hold Xarelto prior to operation as well.  She is here with her sister.  Still lives in her own home and functional with basic ADLs, uses a walker.  She has an Geophysicist/field seismologist that comes daily.  She does not report any angina or nitroglycerin use, no increasing palpitations, no sudden dizziness or syncope.  We went over her medications, she reports compliance with therapy, no spontaneous bleeding problems on Xarelto.  Her weight is down a few pounds, states that her leg swelling has been better and she does not report any orthopnea or PND.  [employer] CRT-P in place with follow-up with Dr. Elberta Fortis.  Last device interrogation in August indicated 16% AF burden.  Follow-up echocardiogram in May of this year indicated low normal LVEF at 50 to 55% with mild diastolic dysfunction.  RCRI perioperative cardiac risk index is class III, 6.6% chance of major adverse cardiac event.  Physical Exam: VS:  BP 118/68   Pulse 60   Ht 5\' 1"  (1.549 m)   Wt 208 lb 3.2 oz (94.4 kg)   SpO2 98%   BMI 39.34 kg/m , BMI Body mass index is 39.34 kg/m.  Wt Readings from Last 3 Encounters:  10/03/22 208 lb 3.2 oz (94.4 kg)  08/06/22 211 lb (95.7 kg)  04/27/22 213 lb (96.6 kg)    General: Patient appears comfortable at rest. HEENT: Conjunctiva and lids normal. Neck: Supple, no elevated JVP or carotid bruits. Lungs: Clear to auscultation, nonlabored breathing at rest. Cardiac: Regular rate and rhythm, no S3, 2/6 systolic murmur. Abdomen: Bowel sounds present. Extremities: Mild ankle  edema.  ECG:  An ECG dated 03/09/2022 was personally reviewed today and demonstrated:  Dual-chamber pacing.  Labwork: 11/24/2021: Magnesium 2.4 08/06/2022: ALT 13; AST 20; BUN 17; Creatinine, Ser 0.77; Hemoglobin 14.0; Platelets 303; Potassium 4.2; Sodium 133   Other Studies Reviewed Today:  Echocardiogram 05/28/2022:  1. Left ventricular ejection fraction, by estimation, is 50 to 55%. The  left ventricle has low normal function. The left ventricle demonstrates  global hypokinesis. There is mild left ventricular hypertrophy. Left  ventricular diastolic parameters are  consistent with Grade I diastolic dysfunction (impaired relaxation). The  average left ventricular global longitudinal strain is -16.9 %. The global  longitudinal strain is normal.   2. Right ventricular systolic function is normal. The right ventricular  size is normal. Tricuspid regurgitation signal is inadequate for assessing  PA pressure.   3. The mitral valve is degenerative. Trivial mitral valve regurgitation.   4. The aortic valve is tricuspid. Aortic valve regurgitation is not  visualized. Aortic valve sclerosis/calcification is present, without any  evidence of aortic stenosis. Aortic valve mean gradient measures 4.0 mmHg.   5. Aortic dilatation noted. There is mild dilatation of the aortic root,  measuring 40 mm.   6. The inferior vena cava is normal in size with greater than 50%  respiratory variability, suggesting right atrial pressure of 3 mmHg.   Assessment and Plan:  1.  Preoperative cardiac assessment in a 89 year old woman with CAD status post BMS  to the RCA in May 2014 in [place], patent at follow-up angiography in 2019, and without active angina at this time on medical therapy.  She has a history of nonischemic cardiomyopathy with improvement in LVEF, most recently 50 to 55% in May.  She has been clinically stable without active heart failure symptoms.  Also paroxysmal to persistent atrial fibrillation  managed with antiarrhythmic therapy and anticoagulation.  RCRI perioperative cardiac risk index is overall intermediate range, class III with 6.6% chance of major adverse cardiac event.  She is being considered for left breast lumpectomy potentially under local anesthesia and sedation or LMA.  She does not have a direct contraindication to proceed with surgery and is still considering the matter to decide what she would like to do.  If she does proceed, she would need to hold Xarelto 48 to 72 hours prior.  Would plan to continue her other baseline cardiac medications throughout.  2.  CAD status post BMS to the RCA in May 2014 in [place], patent stent sites documented at follow-up cardiac catheterization in 2019.  No active angina at this time at current level of activity.  She is not on aspirin given concurrent use of Xarelto.  Continues on Lipitor with as needed nitroglycerin.  3.  HFrecEF with history of nonischemic cardiomyopathy, LVEF low normal range at 50 to 55% by echocardiogram in May of this year.  Clinically stable with NYHA class II dyspnea, no fluid retention.  Current regimen includes Zebeta, Aldactone, Entresto, and Lasix with potassium supplement.  4.  Paroxysmal to persistent atrial fibrillation with CHA2DS2-VASc score of 6.  She had 16% rhythm burden by most recent device interrogation and continues on Tikosyn along with Xarelto.  5.  Essential hypertension.  Blood pressure is well-controlled today.  Disposition:  Follow up  2 months.  Signed, Jonelle Sidle, M.D., F.A.C.C. [facility]

## 2022-10-03 NOTE — Patient Instructions (Addendum)
Medication Instructions:  Your physician recommends that you continue on your current medications as directed. Please refer to the Current Medication list given to you today.  Labwork: none  Testing/Procedures: none  Follow-Up: Your physician recommends that you schedule a follow-up appointment in: 2 months  Any Other Special Instructions Will Be Listed Below (If Applicable).  If you need a refill on your cardiac medications before your next appointment, please call your pharmacy.

## 2022-10-10 DIAGNOSIS — C50412 Malignant neoplasm of upper-outer quadrant of left female breast: Secondary | ICD-10-CM | POA: Diagnosis not present

## 2022-10-10 DIAGNOSIS — Z170 Estrogen receptor positive status [ER+]: Secondary | ICD-10-CM | POA: Diagnosis not present

## 2022-10-10 DIAGNOSIS — Z01818 Encounter for other preprocedural examination: Secondary | ICD-10-CM | POA: Diagnosis not present

## 2022-10-10 NOTE — Telephone Encounter (Signed)
Office is calling back for update. Please advise  

## 2022-10-10 NOTE — Telephone Encounter (Signed)
I faxed preop visit clearance notes to requesting office via EPIC fax.

## 2022-10-11 ENCOUNTER — Telehealth: Payer: Self-pay | Admitting: Cardiology

## 2022-10-11 DIAGNOSIS — Z8551 Personal history of malignant neoplasm of bladder: Secondary | ICD-10-CM | POA: Diagnosis not present

## 2022-10-11 DIAGNOSIS — M48061 Spinal stenosis, lumbar region without neurogenic claudication: Secondary | ICD-10-CM | POA: Diagnosis not present

## 2022-10-11 DIAGNOSIS — C50412 Malignant neoplasm of upper-outer quadrant of left female breast: Secondary | ICD-10-CM | POA: Diagnosis not present

## 2022-10-11 DIAGNOSIS — Z950 Presence of cardiac pacemaker: Secondary | ICD-10-CM | POA: Diagnosis not present

## 2022-10-11 DIAGNOSIS — Z8679 Personal history of other diseases of the circulatory system: Secondary | ICD-10-CM | POA: Diagnosis not present

## 2022-10-11 DIAGNOSIS — Z7901 Long term (current) use of anticoagulants: Secondary | ICD-10-CM | POA: Diagnosis not present

## 2022-10-11 DIAGNOSIS — Z170 Estrogen receptor positive status [ER+]: Secondary | ICD-10-CM | POA: Diagnosis not present

## 2022-10-11 NOTE — Telephone Encounter (Signed)
Patient Name: [name]  DOB: 01/27/1930 MRN: [id number]  Primary Cardiologist: Nona Dell, MD  Chart reviewed as part of pre-operative protocol coverage. Given past medical history and time since last visit, based on ACC/AHA guidelines, [name] is at acceptable risk for the planned procedure without further cardiovascular testing.   Per Dr. Diona Browner on 10/03/2022 Preoperative cardiac assessment in a 89 year old woman with CAD status post BMS to the RCA in May 2014 in [place], patent at follow-up angiography in 2019, and without active angina at this time on medical therapy.  She has a history of nonischemic cardiomyopathy with improvement in LVEF, most recently 50 to 55% in May.  She has been clinically stable without active heart failure symptoms.  Also paroxysmal to persistent atrial fibrillation managed with antiarrhythmic therapy and anticoagulation.  RCRI perioperative cardiac risk index is overall intermediate range, class III with 6.6% chance of major adverse cardiac event.  She is being considered for left breast lumpectomy potentially under local anesthesia and sedation or LMA.  She does not have a direct contraindication to proceed with surgery and is still considering the matter to decide what she would like to do.  If she does proceed, she would need to hold Xarelto 48 to 72 hours prior.  Would plan to continue her other baseline cardiac medications throughout.   The patient was advised that if she develops new symptoms prior to surgery to contact our office to arrange for a follow-up visit, and she verbalized understanding.  I will route this recommendation to the requesting party via Epic fax function and remove from pre-op pool.  Please call with questions.  Joni Reining, NP 10/11/2022, 3:36 PM

## 2022-10-11 NOTE — Telephone Encounter (Signed)
I will forward to pre op APP to review if pt has been cleared. I have read notes from Dr. Diona Browner which does indicate the pt has been cleared; however need to confirm with pre op APP if any additional notes are needed.

## 2022-10-11 NOTE — Telephone Encounter (Signed)
Follow Up::     Angelique Blonder is calling to check on the status of patient's clearance. She need this fax asap to [phone]. She did not get it through Epic.

## 2022-10-11 NOTE — Telephone Encounter (Signed)
Thank you Joni Reining, DNP for your review of the clearance.

## 2022-10-19 DIAGNOSIS — Z01810 Encounter for preprocedural cardiovascular examination: Secondary | ICD-10-CM | POA: Diagnosis not present

## 2022-10-23 DIAGNOSIS — M48061 Spinal stenosis, lumbar region without neurogenic claudication: Secondary | ICD-10-CM | POA: Diagnosis not present

## 2022-10-23 DIAGNOSIS — Z170 Estrogen receptor positive status [ER+]: Secondary | ICD-10-CM | POA: Diagnosis not present

## 2022-10-23 DIAGNOSIS — Z950 Presence of cardiac pacemaker: Secondary | ICD-10-CM | POA: Diagnosis not present

## 2022-10-23 DIAGNOSIS — Z7901 Long term (current) use of anticoagulants: Secondary | ICD-10-CM | POA: Diagnosis not present

## 2022-10-23 DIAGNOSIS — C50412 Malignant neoplasm of upper-outer quadrant of left female breast: Secondary | ICD-10-CM | POA: Diagnosis not present

## 2022-10-23 DIAGNOSIS — Z8679 Personal history of other diseases of the circulatory system: Secondary | ICD-10-CM | POA: Diagnosis not present

## 2022-10-23 DIAGNOSIS — Z8551 Personal history of malignant neoplasm of bladder: Secondary | ICD-10-CM | POA: Diagnosis not present

## 2022-10-27 ENCOUNTER — Other Ambulatory Visit: Payer: Self-pay | Admitting: Cardiology

## 2022-11-08 DIAGNOSIS — I959 Hypotension, unspecified: Secondary | ICD-10-CM | POA: Diagnosis not present

## 2022-11-08 DIAGNOSIS — I482 Chronic atrial fibrillation, unspecified: Secondary | ICD-10-CM | POA: Diagnosis not present

## 2022-11-08 DIAGNOSIS — I509 Heart failure, unspecified: Secondary | ICD-10-CM | POA: Diagnosis not present

## 2022-11-08 DIAGNOSIS — M5430 Sciatica, unspecified side: Secondary | ICD-10-CM | POA: Diagnosis not present

## 2022-11-19 ENCOUNTER — Ambulatory Visit: Payer: Medicare Other | Admitting: Urology

## 2022-11-19 VITALS — BP 106/65 | HR 60

## 2022-11-19 DIAGNOSIS — Z8551 Personal history of malignant neoplasm of bladder: Secondary | ICD-10-CM | POA: Diagnosis not present

## 2022-11-19 DIAGNOSIS — C678 Malignant neoplasm of overlapping sites of bladder: Secondary | ICD-10-CM | POA: Diagnosis not present

## 2022-11-19 DIAGNOSIS — N3941 Urge incontinence: Secondary | ICD-10-CM | POA: Diagnosis not present

## 2022-11-19 LAB — URINALYSIS, ROUTINE W REFLEX MICROSCOPIC
Bilirubin, UA: NEGATIVE
Glucose, UA: NEGATIVE
Ketones, UA: NEGATIVE
Nitrite, UA: NEGATIVE
Protein,UA: NEGATIVE
RBC, UA: NEGATIVE
Specific Gravity, UA: 1.01 (ref 1.005–1.030)
Urobilinogen, Ur: 0.2 mg/dL (ref 0.2–1.0)
pH, UA: 7 (ref 5.0–7.5)

## 2022-11-19 LAB — MICROSCOPIC EXAMINATION

## 2022-11-19 MED ORDER — SULFAMETHOXAZOLE-TRIMETHOPRIM 800-160 MG PO TABS: 1.0000 | ORAL_TABLET | Freq: Every day | ORAL | 0 refills | Status: DC

## 2022-11-19 MED ORDER — CIPROFLOXACIN HCL 500 MG PO TABS
500.0000 mg | ORAL_TABLET | Freq: Once | ORAL | Status: DC
Start: 2022-11-19 — End: 2022-11-19

## 2022-11-19 MED ORDER — MIRABEGRON ER 50 MG PO TB24: 50.0000 mg | ORAL_TABLET | Freq: Every day | ORAL | 11 refills | Status: DC

## 2022-11-19 NOTE — Progress Notes (Unsigned)
11/19/2022 1:32 PM   [name] 06-04-1930 [id number]  Referring provider: Donetta Potts, MD [address] [place],  [place] [address]  No chief complaint on file.   HPI:    PMH: Past Medical History:  Diagnosis Date   Arthritis    Atrial fibrillation [facility])    Atrial flutter (HCC)    Bladder cancer (HCC)    Cardiomyopathy (HCC)    Coronary atherosclerosis of native coronary artery    a. BMS RCA May 2014 - [place] stent. b. Cath 04/2017 - patent stent, minimal CAD otherwise.   Depression    Essential hypertension    GI bleeding 06/2017   a. melena/small bowel ulcer by capsule endo 06/2017.   Hyperlipemia    Macular degeneration    NICM (nonischemic cardiomyopathy) (HCC)    Persistent atrial fibrillation (HCC)    Presence of permanent cardiac pacemaker    Ulcerative colitis     Surgical History: Past Surgical History:  Procedure Laterality Date   ABDOMINAL HYSTERECTOMY     APPENDECTOMY     Bilateral knee replacements      2007, 2008   BIV PACEMAKER INSERTION CRT-P N/A 03/02/2020   Procedure: BIV PACEMAKER INSERTION CRT-P;  Surgeon: Regan Lemming, MD;  [facility]: [facility] INVASIVE CV LAB;  Service: Cardiovascular;  Laterality: N/A;   BLADDER SURGERY     CARDIOVERSION N/A 02/04/2017   Procedure: CARDIOVERSION;  Surgeon: Antoine Poche, MD;  Location: [facility] ENDO SU[facility];  Service: Endoscopy;  Laterality: N/A;   CARDIOVERSION N/A 02/26/2017   Procedure: CARDIOVERSION;  Surgeon: Jonelle Sidle, MD;  Location: [facility] ORS;  Service: Cardiovascular;  Laterality: N/A;   CARDIOVERSION N/A 08/07/2017   Procedure: CARDIOVERSION;  Surgeon: Chrystie Nose, MD;  Location: [facility] ENDOSCOPY;  Service: Cardiovascular;  Laterality: N/A;   COLONOSCOPY N/A 06/15/2015   Procedure: COLONOSCOPY;  Surgeon: Malissa Hippo, MD;  Location: [facility] ENDO [facility];  Service: Endoscopy;  Laterality: N/A;  210   CYSTOSCOPY N/A 11/07/2020   Procedure: CYSTOSCOPY;  Surgeon: Malen Gauze, MD;   Location: [facility] ORS;  Service: Urology;  Laterality: N/A;   CYSTOSCOPY W/ RETROGRADES Bilateral 05/14/2016   Procedure: CYSTOSCOPY WITH BILATERAL RETROGRADE PYELOGRAM;  Surgeon: Heloise Purpura, MD;  Location: [facility] ORS;  Service: Urology;  Laterality: Bilateral;  GENERAL ANESTHESIA WITH PARALYSIS   ESOPHAGOGASTRODUODENOSCOPY (EGD) WITH PROPOFOL N/A 06/14/2017   Procedure: ESOPHAGOGASTRODUODENOSCOPY (EGD) WITH PROPOFOL;  Surgeon: Malissa Hippo, MD;  [facility];  Service: Endoscopy;  Laterality: N/A;   GIVENS CAPSULE STUDY  06/14/2017   Procedure: GIVENS CAPSULE STUDY;  Surgeon: Malissa Hippo, MD;  [facility];  Service: Endoscopy;;   LEFT HEART CATHETERIZATION WITH CORONARY ANGIOGRAM N/A 10/30/2013   Procedure: LEFT HEART CATHETERIZATION WITH CORONARY ANGIOGRAM;  Surgeon: Kathleene Hazel, MD;  Location: [facility] CATH LAB;  Service: Cardiovascular;  Laterality: N/A;   RIGHT/LEFT HEART CATH AND CORONARY ANGIOGRAPHY N/A 05/03/2017   Procedure: RIGHT/LEFT HEART CATH AND CORONARY ANGIOGRAPHY;  Surgeon: Marykay Lex, MD;  Location: [facility] INVASIVE CV LAB;  Service: Cardiovascular;  Laterality: N/A;   TEE WITHOUT CARDIOVERSION N/A 02/04/2017   Procedure: TRANSESOPHAGEAL ECHOCARDIOGRAM (TEE) WITH PROPOL;  Surgeon: Antoine Poche, MD;  [facility];  Service: Endoscopy;  Laterality: N/A;   TONSILLECTOMY     TOTAL KNEE ARTHROPLASTY     TRANSURETHRAL RESECTION OF BLADDER TUMOR N/A 05/14/2016   Procedure: TRANSURETHRAL RESECTION OF BLADDER TUMOR (TURBT);  Surgeon: Heloise Purpura, MD;  Location:  [facility] ORS;  Service: Urology;  Laterality: N/A;  GENERAL ANESTHESIA WITH PARALYSIS   TRANSURETHRAL RESECTION OF BLADDER TUMOR N/A 11/07/2020   Procedure: TRANSURETHRAL RESECTION OF BLADDER TUMOR (TURBT);  Surgeon: Malen Gauze, MD;  Location: [facility] ORS;  Service: Urology;  Laterality: N/A;   YAG LASER APPLICATION Bilateral 11/10/2012   Procedure: YAG LASER APPLICATION;  Surgeon: Susa Simmonds, MD;  Location: [facility] ORS;  Service: Ophthalmology;  Laterality: Bilateral;    Home Medications:  Allergies as of 11/19/2022       Reactions   Chlor-trimeton [chlorpheniramine] Shortness Of Breath   Carvedilol Itching, Rash   Facial rash/itching   Demerol Rash   Penicillins Rash, Other (See Comments)   REACTION: rash, years ago Has patient had a PCN reaction causing immediate rash, facial/tongue/throat swelling, SOB or lightheadedness with hypotension: Yes Has patient had a PCN reaction causing severe rash involving mucus membranes or skin necrosis: No Has patient had a PCN reaction that required hospitalization No Has patient had a PCN reaction occurring within the last 10 years: No If all of the above answers are "NO", then may proceed with Cephalosporin use.        Medication List        Accurate as of November 19, 2022  1:32 PM. If you have any questions, ask your nurse or doctor.          acetaminophen 500 MG tablet Commonly known as: TYLENOL Take 500 mg by mouth every 6 (six) hours as needed for moderate pain.   atorvastatin 20 MG tablet Commonly known as: LIPITOR TAKE 1 TABLET BY MOUTH AT BEDTIME   balsalazide 750 MG capsule Commonly known as: COLAZAL Take 3 capsules (2,250 mg total) by mouth 3 (three) times daily.   bisoprolol 5 MG tablet Commonly known as: ZEBETA TAKE 1/2 TABLET BY MOUTH DAILY   CRANBERRY PO Take 4,200 mg by mouth daily. With vit C   dofetilide 250 MCG capsule Commonly known as: TIKOSYN TAKE ONE CAPSULE BY MOUTH TWICE DAILY   Entresto 24-26 MG Generic drug: sacubitril-valsartan TAKE 1 TABLET BY MOUTH TWICE DAILY   furosemide 20 MG tablet Commonly known as: LASIX TAKE 2 TABLETS BY MOUTH TWICE DAILY   ICAPS AREDS 2 PO Take 2 tablets by mouth daily.   meclizine 25 MG tablet Commonly known as: ANTIVERT Take 25 mg by mouth 3 (three) times daily as needed.   Metamucil Smooth Texture 58.6 % powder Generic drug:  psyllium Take 1 packet by mouth at bedtime.   mirabegron ER 25 MG Tb24 tablet Commonly known as: MYRBETRIQ Take 1 tablet (25 mg total) by mouth daily.   nitroGLYCERIN 0.4 MG SL tablet Commonly known as: NITROSTAT Place 0.4 mg under the tongue every 5 (five) minutes as needed for chest pain.   OVER THE COUNTER MEDICATION Vit D 3 25 mcg (1,000units) one tablet bid.   oxyCODONE-acetaminophen 5-325 MG tablet Commonly known as: PERCOCET/ROXICET Take 1 tablet by mouth every 6 (six) hours as needed.   oxyCODONE-acetaminophen 5-325 MG tablet Commonly known as: PERCOCET/ROXICET Take 1 tablet by mouth every 6 (six) hours as needed for severe pain.   potassium chloride SA 20 MEQ tablet Commonly known as: KLOR-CON M TAKE 1 TABLET BY MOUTH TWICE DAILY   pregabalin 25 MG capsule Commonly known as: LYRICA Take 25 mg by mouth 2 (two) times daily.   sodium chloride 2 % ophthalmic solution Commonly known as: MURO 128 Place 1 drop into both eyes in the morning  and at bedtime.   spironolactone 25 MG tablet Commonly known as: ALDACTONE TAKE 1/2 TABLET BY MOUTH EVERY DAY   Theratears 0.25 % Soln Generic drug: Carboxymethylcellulose Sodium Place 1 drop into both eyes in the morning and at bedtime.   Xarelto 20 MG Tabs tablet Generic drug: rivaroxaban TAKE 1 TABLET BY MOUTH DAILY WITH SUPPER        Allergies:  Allergies  Allergen Reactions   Chlor-Trimeton [Chlorpheniramine] Shortness Of Breath   Carvedilol Itching and Rash    Facial rash/itching   Demerol Rash   Penicillins Rash and Other (See Comments)    REACTION: rash, years ago Has patient had a PCN reaction causing immediate rash, facial/tongue/throat swelling, SOB or lightheadedness with hypotension: Yes Has patient had a PCN reaction causing severe rash involving mucus membranes or skin necrosis: No Has patient had a PCN reaction that required hospitalization No Has patient had a PCN reaction occurring within the last 10  years: No If all of the above answers are "NO", then may proceed with Cephalosporin use.     Family History: Family History  Problem Relation Age of Onset   CAD Father    Diabetes Father    Heart attack Father     Social History:  reports that she has never smoked. She has been exposed to tobacco smoke. She has never used smokeless tobacco. She reports that she does not drink alcohol and does not use drugs.  ROS: All other review of systems were reviewed and are negative except what is noted above in HPI  Physical Exam: BP 106/65   Pulse 60   Constitutional:  Alert and oriented, No acute distress. HEENT: [place] AT, moist mucus membranes.  Trachea midline, no masses. Cardiovascular: No clubbing, cyanosis, or edema. Respiratory: Normal respiratory effort, no increased work of breathing. GI: Abdomen is soft, nontender, nondistended, no abdominal masses GU: No CVA tenderness.  Lymph: No cervical or inguinal lymphadenopathy. Skin: No rashes, bruises or suspicious lesions. Neurologic: Grossly intact, no focal deficits, moving all 4 extremities. Psychiatric: Normal mood and affect.  Laboratory Data: Lab Results  Component Value Date   WBC 9.1 08/06/2022   HGB 14.0 08/06/2022   HCT 42.1 08/06/2022   MCV 88.4 08/06/2022   PLT 303 08/06/2022    Lab Results  Component Value Date   CREATININE 0.77 08/06/2022    No results found for: "PSA"  No results found for: "TESTOSTERONE"  No results found for: "HGBA1C"  Urinalysis    Component Value Date/Time   COLORURINE YELLOW 08/06/2022 1543   APPEARANCEUR CLEAR 08/06/2022 1543   APPEARANCEUR Hazy (A) 11/17/2021 1124   LABSPEC 1.013 08/06/2022 1543   PHURINE 6.0 08/06/2022 1543   GLUCOSEU NEGATIVE 08/06/2022 1543   HGBUR NEGATIVE 08/06/2022 1543   BILIRUBINUR NEGATIVE 08/06/2022 1543   BILIRUBINUR Negative 11/17/2021 1124   KETONESUR NEGATIVE 08/06/2022 1543   PROTEINUR NEGATIVE 08/06/2022 1543   NITRITE NEGATIVE 08/06/2022  1543   LEUKOCYTESUR NEGATIVE 08/06/2022 1543    Lab Results  Component Value Date   LABMICR Comment 11/17/2021   WBCUA 0-5 10/25/2021   LABEPIT 0-10 10/25/2021   BACTERIA Moderate (A) 10/25/2021    Pertinent Imaging: *** Results for orders placed during the hospital encounter of 09/29/05  DG Abd 1 View  Narrative Clinical data:   Nausea, weakness.  PICC line. CHEST - 1 VIEW: Comparison:  09/06/05. Findings:  Mild cardiomegaly.  No pulmonary vascular congestion or active lung process.  PICC line tip is in the  superior vena cava, estimated to be approximately 4.3 cm above the superior vena cava/right atrial junction. IMPRESSION: Mild cardiomegaly.  No acute chest findings. ABDOMEN - 2 VIEW: Findings:  Mild gaseous distention of colon in the hepatic flexure region.  Minimal small bowel distention.  No extraluminal gas. IMPRESSION: Findings compatible with mild nonspecific ileus.  Provider: Maurice March  No results found for this or any previous visit.  No results found for this or any previous visit.  No results found for this or any previous visit.  Results for orders placed during the hospital encounter of 10/08/06  US Renal  Narrative Clinical Data: Urinary frequency. RENAL/URINARY TRACT ULTRASOUND: Technique: Complete ultrasound examination of the urinary tract was performed including evaluation of the kidneys, renal collecting systems, and urinary bladder. Comparison: CT scan 12/14/04. Findings: The right kidney measures 12.3 cm in length and the left kidney measures 13.0 cm in length. No mass or hydronephrosis. There may be small stones in the lower pole of each kidney with one on the right measuring approximately 0.8 cm and on the left measuring approximately 0.7 cm. Incidental imaging of the urinary bladder is unremarkable. There is a hypoechoic focus in the region of the right adnexa measuring approximately 5.2 x 3.5 cm. This cannot be definitively  characterized.  Impression Possible small bilateral renal stones without hydronephrosis. Possible right ovarian enlargement. Pelvic ultrasound could be used for further evaluation.  Provider: Vertell Novak  No valid procedures specified. No results found for this or any previous visit.  No results found for this or any previous visit.   Assessment & Plan:    1. Malignant neoplasm of overlapping sites of bladder (HCC) *** - Urinalysis, Routine w reflex microscopic - Cystoscopy (Bedside) - ciprofloxacin (CIPRO) tablet 500 mg  2. Urgency incontinence ***   No follow-ups on file.  Wilkie Aye, MD  [facility] [place]

## 2022-11-20 ENCOUNTER — Encounter: Payer: Self-pay | Admitting: Urology

## 2022-11-20 NOTE — Patient Instructions (Signed)

## 2022-11-25 ENCOUNTER — Other Ambulatory Visit: Payer: Self-pay | Admitting: Cardiology

## 2022-12-03 ENCOUNTER — Ambulatory Visit: Payer: Medicare Other | Attending: Nurse Practitioner | Admitting: Nurse Practitioner

## 2022-12-03 ENCOUNTER — Encounter: Payer: Self-pay | Admitting: Nurse Practitioner

## 2022-12-03 VITALS — BP 110/60 | HR 62 | Ht 63.0 in | Wt 219.8 lb

## 2022-12-03 DIAGNOSIS — Z950 Presence of cardiac pacemaker: Secondary | ICD-10-CM | POA: Diagnosis not present

## 2022-12-03 DIAGNOSIS — I4892 Unspecified atrial flutter: Secondary | ICD-10-CM

## 2022-12-03 DIAGNOSIS — I5032 Chronic diastolic (congestive) heart failure: Secondary | ICD-10-CM | POA: Diagnosis not present

## 2022-12-03 DIAGNOSIS — I2510 Atherosclerotic heart disease of native coronary artery without angina pectoris: Secondary | ICD-10-CM

## 2022-12-03 DIAGNOSIS — I428 Other cardiomyopathies: Secondary | ICD-10-CM | POA: Diagnosis not present

## 2022-12-03 DIAGNOSIS — E785 Hyperlipidemia, unspecified: Secondary | ICD-10-CM

## 2022-12-03 DIAGNOSIS — I10 Essential (primary) hypertension: Secondary | ICD-10-CM

## 2022-12-03 DIAGNOSIS — I4819 Other persistent atrial fibrillation: Secondary | ICD-10-CM

## 2022-12-03 NOTE — Patient Instructions (Addendum)

## 2022-12-03 NOTE — Progress Notes (Signed)
Office Visit    Patient Name: [name] Date of Encounter: 12/03/2022 PCP:  [name] Potts, MD [facility]  Cardiologist:  [name] Dell, MD  Advanced Practice Provider:  Sharlene Dory, NP Electrophysiologist:  [name] Lemming, MD   Chief Complaint and HPI    [name] is a 89 y.o. female with a hx of HFrEF, nonischemic cardiomyopathy, paroxysmal to persistent atrial fibrillation/A-flutter, CAD, hypertension, hyperlipidemia, ulcerative colitis, history of bladder cancer, breast cancer, pacemaker implanted in 2022, who presents today for follow-up.    Last seen by Dr. Diona Browner for follow-up on October 03, 2022.  At that time, she presented for preoperative cardiovascular risk assessment.  Was scheduled for left breast lumpectomy with Dr. Florian Buff at [facility].  Overall was doing well at that time.  Today she presents for follow-up with her younger sister.  She confirms to me that she was diagnosed with breast cancer a few months ago, had biopsy performed on August 28, and decided not to undergo surgery due to her advanced age.  She is currently taking Arimidex for treatment.  She tells me that her cancer is slow-growing. Continues to ambulate with walker. Denies any chest pain, shortness of breath, palpitations, syncope, presyncope, dizziness, orthopnea, PND, significant weight changes, acute bleeding, or claudication. Compliant with her medications and tolerating well.  Does note chronic, nonpitting edema to BLE, left greater than right, this is stable per her report.  Enjoys spending time with family.  Originally from [place].  Has been married for over 70 years. She is the oldest of 9 children.  EKGs/Labs/Other Studies Reviewed:   The following studies were reviewed today:   EKG:  EKG is not ordered today.   Echo 05/2022:  1. Left ventricular ejection fraction, by estimation, is 50 to 55%. The  left ventricle has low normal  function. The left ventricle demonstrates  global hypokinesis. There is mild left ventricular hypertrophy. Left  ventricular diastolic parameters are consistent with Grade I diastolic dysfunction (impaired relaxation). The average left ventricular global longitudinal strain is -16.9 %. The global longitudinal strain is normal.   2. Right ventricular systolic function is normal. The right ventricular  size is normal. Tricuspid regurgitation signal is inadequate for assessing PA pressure.   3. The mitral valve is degenerative. Trivial mitral valve regurgitation.   4. The aortic valve is tricuspid. Aortic valve regurgitation is not  visualized. Aortic valve sclerosis/calcification is present, without any  evidence of aortic stenosis. Aortic valve mean gradient measures 4.0 mmHg.   5. Aortic dilatation noted. There is mild dilatation of the aortic root,  measuring 40 mm.   6. The inferior vena cava is normal in size with greater than 50%  respiratory variability, suggesting right atrial pressure of 3 mmHg.   Comparison(s): Prior images reviewed side by side. LVEF now low normal range at 50-55%.  Echo 03/2021: 1. Left ventricular ejection fraction, by estimation, is 40%. The left  ventricle has low normal function. The left ventricle demonstrates global  hypokinesis. There is mild left ventricular hypertrophy. Left ventricular  diastolic parameters are  indeterminate.   2. RV not well visualized. Grossly appears normal in size and function. .  Right ventricular systolic function was not well visualized. The right  ventricular size is not well visualized.   3. The mitral valve was not well visualized. No evidence of mitral valve  regurgitation. No evidence of mitral stenosis.   4. The aortic valve is tricuspid. Aortic  valve regurgitation is not  visualized. No aortic stenosis is present.   5. The inferior vena cava is normal in size with greater than 50%  respiratory variability, suggesting  right atrial pressure of 3 mmHg.   Comparison(s): LVEF 30-35%.   Review of Systems    All other systems reviewed and are otherwise negative except as noted above.  Physical Exam    VS:  BP 110/60   Pulse 62   Ht 5\' 3"  (1.6 m)   Wt 219 lb 12.8 oz (99.7 kg)   SpO2 92%   BMI 38.94 kg/m  , BMI Body mass index is 38.94 kg/m.  Wt Readings from Last 3 Encounters:  12/03/22 219 lb 12.8 oz (99.7 kg)  10/03/22 208 lb 3.2 oz (94.4 kg)  08/06/22 211 lb (95.7 kg)    GEN: Obese, 89 y.o. female, in no acute distress. HEENT: normal. Neck: Supple, no JVD, carotid bruits, or masses. Cardiac: S1/S2, RRR, no murmurs, rubs, or gallops. No clubbing, cyanosis. Nonpitting edema, stable (L >R) per her report.  Radials/PT 2+ and equal bilaterally.  Respiratory:  Respirations regular and unlabored, clear to auscultation bilaterally. MS: No deformity or atrophy. Skin: Pale, warm and dry, no rash. Neuro:  Strength and sensation are intact. Psych: Normal affect.  Assessment & Plan    HFmrEF -> HFimpEF, NICM, hx of PPM Stage C, NYHA class I-II symptoms. TTE 05/2022 showed improved EF to 50-55%. Euvolemic and well compensated on exam. Continue bisoprolol, Entresto, Lasix, and Aldactone. Low sodium diet, fluid restriction <2L, and daily weights encouraged. Educated to contact our office for weight gain of 2 lbs overnight or 5 lbs in one week. Normal device function seen on most recent remote device check. Continue to follow-up with EP.  Paroxysmal to Persistent atrial fibrillation/A-flutter Denies any tachycardia/palpitations. HR well controlled today. Continue bisoprolol and Xarelto 20 mg daily. Denies any bleeding issues. Heart healthy diet and regular cardiovascular exercise encouraged.   3. CAD Stable with no anginal symptoms. No indication for ischemic evaluation. Continue current medication regimen. Heart healthy diet and regular cardiovascular exercise encouraged.   4. HLD No recent lipid panel on  file. Will request labs from PCP. Continue current medications. Heart healthy diet and regular cardiovascular exercise encouraged.   5. HTN BP stable. Continue current medications. Discussed to monitor BP at home at least 2 hours after medications and sitting for 5-10 minutes. Heart healthy diet and regular cardiovascular exercise encouraged.   Disposition: Follow up with [name] Dell, MD or APP in 6 months or sooner if anything changes.   Signed, [name] Dory, NP 12/03/2022, 2:32 PM [facility]

## 2022-12-04 ENCOUNTER — Ambulatory Visit (INDEPENDENT_AMBULATORY_CARE_PROVIDER_SITE_OTHER): Payer: Medicare Other

## 2022-12-04 DIAGNOSIS — I428 Other cardiomyopathies: Secondary | ICD-10-CM | POA: Diagnosis not present

## 2022-12-04 DIAGNOSIS — I5032 Chronic diastolic (congestive) heart failure: Secondary | ICD-10-CM

## 2022-12-04 LAB — CUP PACEART REMOTE DEVICE CHECK
Battery Remaining Longevity: 47 mo
Battery Remaining Percentage: 59 %
Battery Voltage: 2.98 V
Brady Statistic AP VP Percent: 80 %
Brady Statistic AP VS Percent: 1 %
Brady Statistic AS VP Percent: 19 %
Brady Statistic AS VS Percent: 1 %
Brady Statistic RA Percent Paced: 66 %
Date Time Interrogation Session: 20241126020014
Implantable Lead Connection Status: 753985
Implantable Lead Connection Status: 753985
Implantable Lead Connection Status: 753985
Implantable Lead Implant Date: 20220223
Implantable Lead Implant Date: 20220223
Implantable Lead Implant Date: 20220223
Implantable Lead Location: 753858
Implantable Lead Location: 753859
Implantable Lead Location: 753860
Implantable Pulse Generator Implant Date: 20220223
Lead Channel Impedance Value: 510 Ohm
Lead Channel Impedance Value: 590 Ohm
Lead Channel Impedance Value: 700 Ohm
Lead Channel Pacing Threshold Amplitude: 0.5 V
Lead Channel Pacing Threshold Amplitude: 1.25 V
Lead Channel Pacing Threshold Amplitude: 1.25 V
Lead Channel Pacing Threshold Pulse Width: 0.5 ms
Lead Channel Pacing Threshold Pulse Width: 0.5 ms
Lead Channel Pacing Threshold Pulse Width: 0.8 ms
Lead Channel Sensing Intrinsic Amplitude: 4.1 mV
Lead Channel Sensing Intrinsic Amplitude: 9.2 mV
Lead Channel Setting Pacing Amplitude: 2.25 V
Lead Channel Setting Pacing Amplitude: 2.5 V
Lead Channel Setting Pacing Amplitude: 2.5 V
Lead Channel Setting Pacing Pulse Width: 0.5 ms
Lead Channel Setting Pacing Pulse Width: 0.8 ms
Lead Channel Setting Sensing Sensitivity: 2 mV
Pulse Gen Model: 3562
Pulse Gen Serial Number: 3861599

## 2022-12-27 ENCOUNTER — Other Ambulatory Visit: Payer: Self-pay | Admitting: Cardiology

## 2023-01-03 NOTE — Progress Notes (Signed)
Remote pacemaker transmission.   

## 2023-01-16 DIAGNOSIS — R109 Unspecified abdominal pain: Secondary | ICD-10-CM | POA: Diagnosis not present

## 2023-01-16 DIAGNOSIS — C50412 Malignant neoplasm of upper-outer quadrant of left female breast: Secondary | ICD-10-CM | POA: Diagnosis not present

## 2023-01-16 DIAGNOSIS — C50912 Malignant neoplasm of unspecified site of left female breast: Secondary | ICD-10-CM | POA: Diagnosis not present

## 2023-01-16 DIAGNOSIS — Z6837 Body mass index (BMI) 37.0-37.9, adult: Secondary | ICD-10-CM | POA: Diagnosis not present

## 2023-01-16 DIAGNOSIS — Z170 Estrogen receptor positive status [ER+]: Secondary | ICD-10-CM | POA: Diagnosis not present

## 2023-01-18 DIAGNOSIS — R109 Unspecified abdominal pain: Secondary | ICD-10-CM | POA: Diagnosis not present

## 2023-01-18 DIAGNOSIS — K8020 Calculus of gallbladder without cholecystitis without obstruction: Secondary | ICD-10-CM | POA: Diagnosis not present

## 2023-01-18 DIAGNOSIS — K7689 Other specified diseases of liver: Secondary | ICD-10-CM | POA: Diagnosis not present

## 2023-01-18 DIAGNOSIS — N281 Cyst of kidney, acquired: Secondary | ICD-10-CM | POA: Diagnosis not present

## 2023-01-23 DIAGNOSIS — Z6837 Body mass index (BMI) 37.0-37.9, adult: Secondary | ICD-10-CM | POA: Diagnosis not present

## 2023-01-23 DIAGNOSIS — R030 Elevated blood-pressure reading, without diagnosis of hypertension: Secondary | ICD-10-CM | POA: Diagnosis not present

## 2023-01-23 DIAGNOSIS — R109 Unspecified abdominal pain: Secondary | ICD-10-CM | POA: Diagnosis not present

## 2023-01-24 ENCOUNTER — Other Ambulatory Visit: Payer: Self-pay | Admitting: Cardiology

## 2023-01-31 ENCOUNTER — Encounter (INDEPENDENT_AMBULATORY_CARE_PROVIDER_SITE_OTHER): Payer: Self-pay | Admitting: Gastroenterology

## 2023-01-31 ENCOUNTER — Ambulatory Visit (INDEPENDENT_AMBULATORY_CARE_PROVIDER_SITE_OTHER): Payer: Medicare Other | Admitting: Gastroenterology

## 2023-01-31 VITALS — BP 106/68 | HR 65 | Temp 97.8°F | Ht 63.0 in | Wt 219.6 lb

## 2023-01-31 DIAGNOSIS — K8020 Calculus of gallbladder without cholecystitis without obstruction: Secondary | ICD-10-CM

## 2023-01-31 DIAGNOSIS — R7989 Other specified abnormal findings of blood chemistry: Secondary | ICD-10-CM | POA: Diagnosis not present

## 2023-01-31 DIAGNOSIS — M549 Dorsalgia, unspecified: Secondary | ICD-10-CM | POA: Insufficient documentation

## 2023-01-31 DIAGNOSIS — M545 Low back pain, unspecified: Secondary | ICD-10-CM

## 2023-01-31 DIAGNOSIS — R109 Unspecified abdominal pain: Secondary | ICD-10-CM

## 2023-01-31 MED ORDER — CYCLOBENZAPRINE HCL 5 MG PO TABS: 5.0000 mg | ORAL_TABLET | Freq: Three times a day (TID) | ORAL | 0 refills | Status: AC | PRN

## 2023-01-31 NOTE — Progress Notes (Unsigned)
Katrinka Blazing, M.D. Gastroenterology & Hepatology [facility] Gastroenterology [address] [place], [place] [address]  Primary Care Physician: Donetta Potts, MD [address] [place] [address]  I will communicate my assessment and recommendations to the referring MD via EMR.  Problems: panulcerative colitis in remission History of asymptomatic gallstones Elevated LFTs Flank pain   History of Present Illness: [name] is a 89 y.o. female, with Pmh pan ulcerative colitis, bladder cancer, heart failure with reduced ejection fraction, history of pacemaker placement, cardiomyopathy, coronary artery disease status post stent placement, depression, breast cancer, atrial fibrillation, macular degeneration, hypertension, who presents for evaluation of flank pain.  The patient was last seen on 04/12/2022. At that time, the patient was continued on balsalazide 2.25 g 3 times daily.  She was also advised to take MiraLAX 1 capful every day.  She was noted to have elevation in total bilirubin up to 2.1.  Patient reports that she developed abdominal pain in her R flank and R back pain for the last 2 weeks. She reports that her pain is also present in her right lower quadrant. She has had fluctuation in her appetite. States that the pain was very severe and had to go to the ER a few days ago. The patient underwent a CT of the abdomen and pelvis without contrast on 01/18/2023 at [facility] (not able to review the images) which report thickening in the bladder, presence of moderate volume of stool in the colon, cholelithiasis, no other abnormalities. Patient has had some pain when she moves or if she takes a deep breath.  She has been taking baclofen twice a day for the last few days, that has helped with her lumbar pain.  She has fluctuation of her bowel movements - some days she has constipation for a couple days, followed by a few days of up to 4 episodes  of diarrhea. She is taking Miralax 1 capful. She added Metamucil to her regimen as well.  The patient denies having any nausea, vomiting, fever, chills, hematochezia, melena, hematemesis, abdominal distention, abdominal pain, diarrhea, jaundice, pruritus or weight loss.  Last EGD: 06/14/2017, 2 cm hiatal hernia, normal stomach and duodenum.  A capsule endoscopy was deployed. Capsule endoscopy performed on 06/15/2017 which showed small bowel small ulcer with brown eschar but no active bleeding.  There was also small submucosal lesion concerning for a submucosal lipoma. Last Colonoscopy: June 2017 with removal of 5 small tubular adenomas (polyps were removed from cecum between 4 to 7 mm, sigmoid colon and splenic flexure).  UC was in remission.  Past Medical History: Past Medical History:  Diagnosis Date   Arthritis    Atrial fibrillation [facility])    Atrial flutter (HCC)    Bladder cancer (HCC)    Cardiomyopathy (HCC)    Coronary atherosclerosis of native coronary artery    a. BMS RCA May 2014 - [place] stent. b. Cath 04/2017 - patent stent, minimal CAD otherwise.   Depression    Essential hypertension    GI bleeding 06/2017   a. melena/small bowel ulcer by capsule endo 06/2017.   Hyperlipemia    Macular degeneration    NICM (nonischemic cardiomyopathy) (HCC)    Persistent atrial fibrillation (HCC)    Presence of permanent cardiac pacemaker    Ulcerative colitis     Past Surgical History: Past Surgical History:  Procedure Laterality Date   ABDOMINAL HYSTERECTOMY     APPENDECTOMY     Bilateral knee  replacements      2007, 2008   BIV PACEMAKER INSERTION CRT-P N/A 03/02/2020   Procedure: BIV PACEMAKER INSERTION CRT-P;  Surgeon: Regan Lemming, MD;  Location: [facility] INVASIVE CV LAB;  Service: Cardiovascular;  Laterality: N/A;   BLADDER SURGERY     CARDIOVERSION N/A 02/04/2017   Procedure: CARDIOVERSION;  Surgeon: Antoine Poche, MD;  [facility]: [facility] ENDO SUITE;  Service: Endoscopy;   Laterality: N/A;   CARDIOVERSION N/A 02/26/2017   Procedure: CARDIOVERSION;  Surgeon: Jonelle Sidle, MD;  Location: [facility] ORS;  Service: Cardiovascular;  Laterality: N/A;   CARDIOVERSION N/A 08/07/2017   Procedure: CARDIOVERSION;  Surgeon: Chrystie Nose, MD;  Location: [facility] ENDOSCOPY;  Service: Cardiovascular;  Laterality: N/A;   COLONOSCOPY N/A 06/15/2015   Procedure: COLONOSCOPY;  Surgeon: Malissa Hippo, MD;  Location: [facility] ENDO SUITE;  Service: Endoscopy;  Laterality: N/A;  210   CYSTOSCOPY N/A 11/07/2020   Procedure: CYSTOSCOPY;  Surgeon: Malen Gauze, MD;  Location: [facility] ORS;  Service: Urology;  Laterality: N/A;   CYSTOSCOPY W/ RETROGRADES Bilateral 05/14/2016   Procedure: CYSTOSCOPY WITH BILATERAL RETROGRADE PYELOGRAM;  Surgeon: Heloise Purpura, MD;  Location: [facility] ORS;  Service: Urology;  Laterality: Bilateral;  GENERAL ANESTHESIA WITH PARALYSIS   ESOPHAGOGASTRODUODENOSCOPY (EGD) WITH PROPOFOL N/A 06/14/2017   Procedure: ESOPHAGOGASTRODUODENOSCOPY (EGD) WITH PROPOFOL;  Surgeon: Malissa Hippo, MD;  Location: [facility] ENDO SUITE;  Service: Endoscopy;  Laterality: N/A;   GIVENS CAPSULE STUDY  06/14/2017   Procedure: GIVENS CAPSULE STUDY;  Surgeon: Malissa Hippo, MD;  Location: [facility] END[facility];  Service: Endoscopy;;   LEFT HEART CATHETERIZATION WITH CORONARY ANGIOGRAM N/A 10/30/2013   Procedure: LEFT HEART CATHETERIZATION WITH CORONARY ANGIOGRAM;  Surgeon: Kathleene Hazel, MD;  Location: [facility] CATH LAB;  Service: Cardiovascular;  Laterality: N/A;   RIGHT/LEFT HEART CATH AND CORONARY ANGIOGRAPHY N/A 05/03/2017   Procedure: RIGHT/LEFT HEART CATH AND CORONARY ANGIOGRAPHY;  Surgeon: Marykay Lex, MD;  Location: [facility] INVASIVE CV LAB;  Service: Cardiovascular;  Laterality: N/A;   TEE WITHOUT CARDIOVERSION N/A 02/04/2017   Procedure: TRANSESOPHAGEAL ECHOCARDIOGRAM (TEE) WITH PROPOL;  Surgeon: Antoine Poche, MD;  [facility];  Service: Endoscopy;  Laterality: N/A;   TONSILLECTOMY      TOTAL KNEE ARTHROPLASTY     TRANSURETHRAL RESECTION OF BLADDER TUMOR N/A 05/14/2016   Procedure: TRANSURETHRAL RESECTION OF BLADDER TUMOR (TURBT);  Surgeon: Heloise Purpura, MD;  Location: [facility] ORS;  Service: Urology;  Laterality: N/A;  GENERAL ANESTHESIA WITH PARALYSIS   TRANSURETHRAL RESECTION OF BLADDER TUMOR N/A 11/07/2020   Procedure: TRANSURETHRAL RESECTION OF BLADDER TUMOR (TURBT);  Surgeon: Malen Gauze, MD;  Location: [facility] ORS;  Service: Urology;  Laterality: N/A;   YAG LASER APPLICATION Bilateral 11/10/2012   Procedure: YAG LASER APPLICATION;  Surgeon: Susa Simmonds, MD;  Location: [facility] ORS;  Service: Ophthalmology;  Laterality: Bilateral;    Family History: Family History  Problem Relation Age of Onset   CAD Father    Diabetes Father    Heart attack Father     Social History: Social History   Tobacco Use  Smoking Status Never   Passive exposure: Past  Smokeless Tobacco Never  Tobacco Comments   Never smoke 10/04/21   Social History   Substance and Sexual Activity  Alcohol Use No   Alcohol/week: 0.0 standard drinks of alcohol   Social History   Substance and Sexual Activity  Drug Use No    Allergies: Allergies  Allergen Reactions   Chlor-Trimeton [Chlorpheniramine] Shortness  Of Breath   Carvedilol Itching and Rash    Facial rash/itching   Demerol Rash   Penicillins Rash and Other (See Comments)    REACTION: rash, years ago Has patient had a PCN reaction causing immediate rash, facial/tongue/throat swelling, SOB or lightheadedness with hypotension: Yes Has patient had a PCN reaction causing severe rash involving mucus membranes or skin necrosis: No Has patient had a PCN reaction that required hospitalization No Has patient had a PCN reaction occurring within the last 10 years: No If all of the above answers are "NO", then may proceed with Cephalosporin use.     Medications: Current Outpatient Medications  Medication Sig Dispense Refill    acetaminophen (TYLENOL) 500 MG tablet Take 500 mg by mouth every 6 (six) hours as needed for moderate pain.     anastrozole (ARIMIDEX) 1 MG tablet Take 1 mg by mouth daily.     atorvastatin (LIPITOR) 20 MG tablet TAKE 1 TABLET BY MOUTH AT BEDTIME 30 tablet 1   baclofen (LIORESAL) 10 MG tablet Take 10 mg by mouth. Take one as needed daily     balsalazide (COLAZAL) 750 MG capsule Take 3 capsules (2,250 mg total) by mouth 3 (three) times daily. 270 capsule 11   bisoprolol (ZEBETA) 5 MG tablet TAKE 1/2 TABLET BY MOUTH DAILY 45 tablet 1   Carboxymethylcellulose Sodium (THERATEARS) 0.25 % SOLN Place 1 drop into both eyes in the morning and at bedtime.     CRANBERRY PO Take 4,200 mg by mouth daily. With vit C     dofetilide (TIKOSYN) 250 MCG capsule TAKE ONE CAPSULE BY MOUTH TWICE DAILY 60 capsule 1   ENTRESTO 24-26 MG TAKE 1 TABLET BY MOUTH TWICE DAILY 60 tablet 6   furosemide (LASIX) 20 MG tablet TAKE 2 TABLETS BY MOUTH TWICE DAILY 360 tablet 1   meclizine (ANTIVERT) 25 MG tablet Take 25 mg by mouth 3 (three) times daily as needed.     mirabegron ER (MYRBETRIQ) 50 MG TB24 tablet Take 1 tablet (50 mg total) by mouth daily. 30 tablet 11   Multiple Vitamins-Minerals (ICAPS AREDS 2 PO) Take 2 tablets by mouth daily.     nitroGLYCERIN (NITROSTAT) 0.4 MG SL tablet Place 0.4 mg under the tongue every 5 (five) minutes as needed for chest pain.     OVER THE COUNTER MEDICATION Vit d one a day     potassium chloride SA (KLOR-CON M) 20 MEQ tablet TAKE 1 TABLET BY MOUTH TWICE DAILY 180 tablet 2   psyllium (METAMUCIL SMOOTH TEXTURE) 58.6 % powder Take 1 packet by mouth at bedtime. 283 g 12   rivaroxaban (XARELTO) 20 MG TABS tablet TAKE 1 TABLET BY MOUTH DAILY WITH SUPPER 90 tablet 1   sodium chloride (MURO 128) 2 % ophthalmic solution Place 1 drop into both eyes in the morning and at bedtime.     spironolactone (ALDACTONE) 25 MG tablet TAKE 1/2 TABLET BY MOUTH EVERY DAY 15 tablet 2   No current  facility-administered medications for this visit.    Review of Systems: GENERAL: negative for malaise, night sweats HEENT: No changes in hearing or vision, no nose bleeds or other nasal problems. NECK: Negative for lumps, goiter, pain and significant neck swelling RESPIRATORY: Negative for cough, wheezing CARDIOVASCULAR: Negative for chest pain, leg swelling, palpitations, orthopnea GI: SEE HPI MUSCULOSKELETAL: Negative for joint pain or swelling, back pain, and muscle pain. SKIN: Negative for lesions, rash PSYCH: Negative for sleep disturbance, mood disorder and recent psychosocial stressors. HEMATOLOGY  Negative for prolonged bleeding, bruising easily, and swollen nodes. ENDOCRINE: Negative for cold or heat intolerance, polyuria, polydipsia and goiter. NEURO: negative for tremor, gait imbalance, syncope and seizures. The remainder of the review of systems is noncontributory.   Physical Exam: BP 106/68   Pulse 65   Temp 97.8 F (36.6 C) (Oral)   Ht 5\' 3"  (1.6 m)   Wt 219 lb 9.6 oz (99.6 kg)   BMI 38.90 kg/m  GENERAL: The patient is AO x3, in no acute distress. HEENT: Head is normocephalic and atraumatic. EOMI are intact. Mouth is well hydrated and without lesions. NECK: Supple. No masses LUNGS: Clear to auscultation. No presence of rhonchi/wheezing/rales. Adequate chest expansion HEART: RRR, normal s1 and s2. ABDOMEN: mildly tender in the R flank close to the back, no guarding, no peritoneal signs, and nondistended. BS +. No masses. EXTREMITIES: Without any cyanosis, clubbing, rash, lesions or edema. NEUROLOGIC: AOx3, no focal motor deficit. SKIN: no jaundice, no rashes  Imaging/Labs: as above  I personally reviewed and interpreted the available labs, imaging and endoscopic files.  Impression and Plan: [name] is a 89 y.o. female, with Pmh pan ulcerative colitis, bladder cancer, heart failure with reduced ejection fraction, history of pacemaker placement,  cardiomyopathy, coronary artery disease status post stent placement, depression, breast cancer, atrial fibrillation, macular degeneration, hypertension, who presents for evaluation of flank pain.  Patient has presented intermittent episodes of pain in her right flank and in the lumbar area.  These symptoms have improved with the use of baclofen and are typically exacerbated when she moves.  It is very likely her symptoms are related to a musculoskeletal component.  The report of her most recent CT scan was reassuring and she is not presenting any symptoms typical of biliary colic.  I will review the images with the radiologist at [facility] next week.  As she has not presented any other associated symptoms, we will manage this conservatively with a muscle relaxant as needed (Flexeril).  Finally, she had some mild hyperbilirubinemia in her last appointment.  Will repeat blood workup today.  Is very likely she is presenting Gilbert's syndrome.  -Check CMP and fractionated bilirubin -Will review CT scan imaging with radiologist at [facility] -Start Flexeril as needed for flank pain  All questions were answered.      Katrinka Blazing, MD Gastroenterology and Hepatology [facility] Gastroenterology

## 2023-01-31 NOTE — Patient Instructions (Addendum)
Perform blood workup Will review CT scan imaging with radiologist at [facility] Flexeril as needed for flank pain

## 2023-02-01 LAB — COMPREHENSIVE METABOLIC PANEL
AG Ratio: 1.7 (calc) (ref 1.0–2.5)
ALT: 15 U/L (ref 6–29)
AST: 17 U/L (ref 10–35)
Albumin: 4.3 g/dL (ref 3.6–5.1)
Alkaline phosphatase (APISO): 69 U/L (ref 37–153)
BUN: 19 mg/dL (ref 7–25)
CO2: 24 mmol/L (ref 20–32)
Calcium: 9.6 mg/dL (ref 8.6–10.4)
Chloride: 101 mmol/L (ref 98–110)
Creat: 0.86 mg/dL (ref 0.60–0.95)
Globulin: 2.5 g/dL (ref 1.9–3.7)
Glucose, Bld: 90 mg/dL (ref 65–99)
Potassium: 4.8 mmol/L (ref 3.5–5.3)
Sodium: 138 mmol/L (ref 135–146)
Total Bilirubin: 0.9 mg/dL (ref 0.2–1.2)
Total Protein: 6.8 g/dL (ref 6.1–8.1)

## 2023-02-07 ENCOUNTER — Encounter (INDEPENDENT_AMBULATORY_CARE_PROVIDER_SITE_OTHER): Payer: Self-pay

## 2023-02-13 DIAGNOSIS — Z170 Estrogen receptor positive status [ER+]: Secondary | ICD-10-CM | POA: Diagnosis not present

## 2023-02-13 DIAGNOSIS — C50412 Malignant neoplasm of upper-outer quadrant of left female breast: Secondary | ICD-10-CM | POA: Diagnosis not present

## 2023-03-05 ENCOUNTER — Ambulatory Visit (INDEPENDENT_AMBULATORY_CARE_PROVIDER_SITE_OTHER): Payer: Medicare Other

## 2023-03-05 DIAGNOSIS — I428 Other cardiomyopathies: Secondary | ICD-10-CM

## 2023-03-06 LAB — CUP PACEART REMOTE DEVICE CHECK
Battery Remaining Longevity: 45 mo
Battery Remaining Percentage: 56 %
Battery Voltage: 2.96 V
Brady Statistic AP VP Percent: 81 %
Brady Statistic AP VS Percent: 1 %
Brady Statistic AS VP Percent: 18 %
Brady Statistic AS VS Percent: 1 %
Brady Statistic RA Percent Paced: 68 %
Date Time Interrogation Session: 20250225020009
Implantable Lead Connection Status: 753985
Implantable Lead Connection Status: 753985
Implantable Lead Connection Status: 753985
Implantable Lead Implant Date: 20220223
Implantable Lead Implant Date: 20220223
Implantable Lead Implant Date: 20220223
Implantable Lead Location: 753858
Implantable Lead Location: 753859
Implantable Lead Location: 753860
Implantable Pulse Generator Implant Date: 20220223
Lead Channel Impedance Value: 510 Ohm
Lead Channel Impedance Value: 590 Ohm
Lead Channel Impedance Value: 690 Ohm
Lead Channel Pacing Threshold Amplitude: 0.5 V
Lead Channel Pacing Threshold Amplitude: 1.25 V
Lead Channel Pacing Threshold Amplitude: 1.25 V
Lead Channel Pacing Threshold Pulse Width: 0.5 ms
Lead Channel Pacing Threshold Pulse Width: 0.5 ms
Lead Channel Pacing Threshold Pulse Width: 0.8 ms
Lead Channel Sensing Intrinsic Amplitude: 4.2 mV
Lead Channel Sensing Intrinsic Amplitude: 9.9 mV
Lead Channel Setting Pacing Amplitude: 2.25 V
Lead Channel Setting Pacing Amplitude: 2.5 V
Lead Channel Setting Pacing Amplitude: 2.5 V
Lead Channel Setting Pacing Pulse Width: 0.5 ms
Lead Channel Setting Pacing Pulse Width: 0.8 ms
Lead Channel Setting Sensing Sensitivity: 2 mV
Pulse Gen Model: 3562
Pulse Gen Serial Number: 3861599

## 2023-03-25 ENCOUNTER — Other Ambulatory Visit: Payer: Self-pay | Admitting: Cardiology

## 2023-03-25 NOTE — Telephone Encounter (Signed)
 Prescription refill request for Xarelto received.  Indication: AF Last office visit: 12/03/22  Shawnie Dapper NP Weight: 99.7kg Age: 89 Scr: 0.86 on 01/31/23  Epic CrCl: 68.43  Based on above findings Xarelto 20mg  daily is the appropriate dose.  Refill approved.

## 2023-03-29 DIAGNOSIS — I10 Essential (primary) hypertension: Secondary | ICD-10-CM | POA: Diagnosis not present

## 2023-03-29 DIAGNOSIS — I509 Heart failure, unspecified: Secondary | ICD-10-CM | POA: Diagnosis not present

## 2023-03-29 DIAGNOSIS — R7303 Prediabetes: Secondary | ICD-10-CM | POA: Diagnosis not present

## 2023-03-29 DIAGNOSIS — E7801 Familial hypercholesterolemia: Secondary | ICD-10-CM | POA: Diagnosis not present

## 2023-03-29 DIAGNOSIS — Z1321 Encounter for screening for nutritional disorder: Secondary | ICD-10-CM | POA: Diagnosis not present

## 2023-03-29 DIAGNOSIS — Z1329 Encounter for screening for other suspected endocrine disorder: Secondary | ICD-10-CM | POA: Diagnosis not present

## 2023-04-03 DIAGNOSIS — Z6837 Body mass index (BMI) 37.0-37.9, adult: Secondary | ICD-10-CM | POA: Diagnosis not present

## 2023-04-03 DIAGNOSIS — M4800 Spinal stenosis, site unspecified: Secondary | ICD-10-CM | POA: Diagnosis not present

## 2023-04-03 DIAGNOSIS — I2510 Atherosclerotic heart disease of native coronary artery without angina pectoris: Secondary | ICD-10-CM | POA: Diagnosis not present

## 2023-04-03 DIAGNOSIS — I482 Chronic atrial fibrillation, unspecified: Secondary | ICD-10-CM | POA: Diagnosis not present

## 2023-04-03 DIAGNOSIS — Z0001 Encounter for general adult medical examination with abnormal findings: Secondary | ICD-10-CM | POA: Diagnosis not present

## 2023-04-03 DIAGNOSIS — I509 Heart failure, unspecified: Secondary | ICD-10-CM | POA: Diagnosis not present

## 2023-04-10 ENCOUNTER — Encounter: Payer: Self-pay | Admitting: *Deleted

## 2023-04-10 NOTE — Progress Notes (Signed)
 Remote pacemaker transmission.

## 2023-04-10 NOTE — Addendum Note (Signed)
 Addended by: Elease Etienne A on: 04/10/2023 11:19 AM   Modules accepted: Orders

## 2023-04-12 ENCOUNTER — Encounter: Payer: Self-pay | Admitting: Cardiovascular Disease

## 2023-04-12 ENCOUNTER — Ambulatory Visit: Payer: Medicare Other | Attending: Cardiovascular Disease | Admitting: Cardiovascular Disease

## 2023-04-12 VITALS — BP 126/78 | HR 61 | Ht 63.0 in | Wt 214.6 lb

## 2023-04-12 DIAGNOSIS — I5032 Chronic diastolic (congestive) heart failure: Secondary | ICD-10-CM

## 2023-04-12 DIAGNOSIS — Z5181 Encounter for therapeutic drug level monitoring: Secondary | ICD-10-CM

## 2023-04-12 LAB — CUP PACEART INCLINIC DEVICE CHECK
Battery Remaining Longevity: 42 mo
Battery Voltage: 2.96 V
Brady Statistic RA Percent Paced: 68 %
Brady Statistic RV Percent Paced: 97 %
Date Time Interrogation Session: 20250404165406
Implantable Lead Connection Status: 753985
Implantable Lead Connection Status: 753985
Implantable Lead Connection Status: 753985
Implantable Lead Implant Date: 20220223
Implantable Lead Implant Date: 20220223
Implantable Lead Implant Date: 20220223
Implantable Lead Location: 753858
Implantable Lead Location: 753859
Implantable Lead Location: 753860
Implantable Pulse Generator Implant Date: 20220223
Lead Channel Impedance Value: 550 Ohm
Lead Channel Impedance Value: 637.5 Ohm
Lead Channel Impedance Value: 725 Ohm
Lead Channel Pacing Threshold Amplitude: 0.5 V
Lead Channel Pacing Threshold Amplitude: 0.5 V
Lead Channel Pacing Threshold Amplitude: 1 V
Lead Channel Pacing Threshold Amplitude: 1 V
Lead Channel Pacing Threshold Amplitude: 1 V
Lead Channel Pacing Threshold Amplitude: 1 V
Lead Channel Pacing Threshold Pulse Width: 0.5 ms
Lead Channel Pacing Threshold Pulse Width: 0.5 ms
Lead Channel Pacing Threshold Pulse Width: 0.5 ms
Lead Channel Pacing Threshold Pulse Width: 0.5 ms
Lead Channel Pacing Threshold Pulse Width: 0.8 ms
Lead Channel Pacing Threshold Pulse Width: 0.8 ms
Lead Channel Sensing Intrinsic Amplitude: 10.9 mV
Lead Channel Sensing Intrinsic Amplitude: 4.8 mV
Lead Channel Setting Pacing Amplitude: 2.25 V
Lead Channel Setting Pacing Amplitude: 2.5 V
Lead Channel Setting Pacing Amplitude: 2.5 V
Lead Channel Setting Pacing Pulse Width: 0.5 ms
Lead Channel Setting Pacing Pulse Width: 0.8 ms
Lead Channel Setting Sensing Sensitivity: 2 mV
Pulse Gen Model: 3562
Pulse Gen Serial Number: 3861599

## 2023-04-12 NOTE — Progress Notes (Signed)
 Electrophysiology Office Note   Date:  04/12/2023   ID:  [name], [name] October 23, 1930, MRN [id number]  PCP:  Donetta Potts, MD  Cardiologist:  Diona Browner Primary Electrophysiologist:  Maurice Small, MD    No chief complaint on file.    History of Present Illness: [name] is a 89 y.o. female who is being seen today for the evaluation of atrial fibrillation at the request of Donetta Potts, MD. Presenting today for electrophysiology evaluation.    She has a history of atrial flutter, atrial fibrillation, coronary artery disease status post RCA stent, nonischemic cardiomyopathy.  She has an ejection fraction of 25%.  She has now status post [employer] CRT-P implanted 03/02/2020.  She reports that she is doing very well. She does not sense her AF -- no palpitations. She does have episodic fatigue and shortness of breath though this is not necessarily correlated with AF.  she has no device related complaints -- no new tenderness, drainage, redness.   PHYSICAL EXAM: VS:  BP 126/78   Pulse 61   Ht 5\' 3"  (1.6 m)   Wt 214 lb 9.6 oz (97.3 kg)   SpO2 95%   BMI 38.01 kg/m  , BMI Body mass index is 38.01 kg/m. Gen: Appears comfortable, well-nourished CV: RRR, no dependent edema The device site is normal -- no tenderness, edema, drainage, redness, threatened erosion. Pulm: breathing easily   EKG:        Personal review of the device interrogation today. Results in Paceart   Recent Labs: 08/06/2022: Hemoglobin 14.0; Platelets 303 01/31/2023: ALT 15; BUN 19; Creat 0.86; Potassium 4.8; Sodium 138    Lipid Panel  No results found for: "CHOL", "TRIG", "HDL", "CHOLHDL", "VLDL", "LDLCALC", "LDLDIRECT"   Wt Readings from Last 3 Encounters:  04/12/23 214 lb 9.6 oz (97.3 kg)  01/31/23 219 lb 9.6 oz (99.6 kg)  12/03/22 219 lb 12.8 oz (99.7 kg)      Other studies Reviewed: TTE May 28, 2022 EF 50 to 55%.  Grade 1 diastolic dysfunction.  LHC  05/03/17 Hemodynamic findings consistent with mild secondary pulmonary hypertension. Patient has severe nonischemic cardiomyopathy Moderate to Severely reduced CO/CI LV end diastolic pressure is moderately elevated. Mid RCA stent widely patent There is mild (2+) mitral regurgitation.  Zio 09/16/18 personally reviewed Max 197 bpm 09:12am, 08/24 Min 38 bpm 03:09am, 08/24 Avg 68 bpm Rare PVCs Frequent PACs Atrial fibrillation: None Multiple runs of SVT, longest 11 seconds, fastest rate 197 for 8 beats  ASSESSMENT AND PLAN:  Persistent atrial fibrillation:  Currently on Xarelto and dofetilide.   High risk medication monitoring.  CHA2DS2-VASc of 4.   She has an increased but tolerable burden of atrial fibrillation on device. Rates are controlled   Base metabolic panel January 2025 reviewed, EKG today reviewed  [employer] CRT pacemaker I reviewed today's device interrogation.  See Paceart for details She is not device-dependent today  Secondary hypercoagulable state:  continue xarelto 20mg  daily.  Recent CBC reviewed.     Nonischemic cardiomyopathy:  EF has recovered  Coronary artery disease:  Status post RCA stent in 2014.  No current chest pain.  Current medicines are reviewed at length with the patient today.   The patient does not have concerns regarding her medicines.  The following changes were made today: none  Labs/ tests ordered today include:  Orders Placed This Encounter  Procedures   EKG 12-Lead    Disposition:   FU in 6 months  Signed, Maurice Small, MD  04/12/2023 11:17 AM

## 2023-04-12 NOTE — Patient Instructions (Signed)
 Medication Instructions:  Continue all current medications.   Labwork: none  Testing/Procedures: none  Follow-Up: 6 months   Any Other Special Instructions Will Be Listed Below (If Applicable).   If you need a refill on your cardiac medications before your next appointment, please call your pharmacy.

## 2023-04-15 ENCOUNTER — Ambulatory Visit (INDEPENDENT_AMBULATORY_CARE_PROVIDER_SITE_OTHER): Payer: Medicare Other | Admitting: Gastroenterology

## 2023-04-15 DIAGNOSIS — Z79811 Long term (current) use of aromatase inhibitors: Secondary | ICD-10-CM | POA: Diagnosis not present

## 2023-04-15 DIAGNOSIS — C50412 Malignant neoplasm of upper-outer quadrant of left female breast: Secondary | ICD-10-CM | POA: Diagnosis not present

## 2023-04-15 DIAGNOSIS — Z170 Estrogen receptor positive status [ER+]: Secondary | ICD-10-CM | POA: Diagnosis not present

## 2023-04-24 ENCOUNTER — Other Ambulatory Visit: Payer: Self-pay | Admitting: Cardiology

## 2023-04-24 ENCOUNTER — Other Ambulatory Visit (INDEPENDENT_AMBULATORY_CARE_PROVIDER_SITE_OTHER): Payer: Self-pay | Admitting: Gastroenterology

## 2023-04-24 ENCOUNTER — Other Ambulatory Visit: Payer: Self-pay | Admitting: Physician Assistant

## 2023-04-24 DIAGNOSIS — K5100 Ulcerative (chronic) pancolitis without complications: Secondary | ICD-10-CM

## 2023-04-24 NOTE — Telephone Encounter (Signed)
 Last seen 01/31/23 for pan ulcerative colitis

## 2023-05-14 ENCOUNTER — Encounter: Payer: Self-pay | Admitting: Cardiology

## 2023-05-20 DIAGNOSIS — Z170 Estrogen receptor positive status [ER+]: Secondary | ICD-10-CM | POA: Diagnosis not present

## 2023-05-20 DIAGNOSIS — C50412 Malignant neoplasm of upper-outer quadrant of left female breast: Secondary | ICD-10-CM | POA: Diagnosis not present

## 2023-05-22 ENCOUNTER — Other Ambulatory Visit: Payer: Self-pay | Admitting: Cardiology

## 2023-05-27 ENCOUNTER — Ambulatory Visit: Payer: Medicare Other | Admitting: Nurse Practitioner

## 2023-06-04 ENCOUNTER — Ambulatory Visit (INDEPENDENT_AMBULATORY_CARE_PROVIDER_SITE_OTHER): Payer: Medicare Other

## 2023-06-04 DIAGNOSIS — I428 Other cardiomyopathies: Secondary | ICD-10-CM

## 2023-06-05 LAB — CUP PACEART REMOTE DEVICE CHECK
Battery Remaining Longevity: 41 mo
Battery Remaining Percentage: 52 %
Battery Voltage: 2.96 V
Brady Statistic AP VP Percent: 87 %
Brady Statistic AP VS Percent: 1 %
Brady Statistic AS VP Percent: 13 %
Brady Statistic AS VS Percent: 1 %
Brady Statistic RA Percent Paced: 70 %
Date Time Interrogation Session: 20250527020013
Implantable Lead Connection Status: 753985
Implantable Lead Connection Status: 753985
Implantable Lead Connection Status: 753985
Implantable Lead Implant Date: 20220223
Implantable Lead Implant Date: 20220223
Implantable Lead Implant Date: 20220223
Implantable Lead Location: 753858
Implantable Lead Location: 753859
Implantable Lead Location: 753860
Implantable Pulse Generator Implant Date: 20220223
Lead Channel Impedance Value: 480 Ohm
Lead Channel Impedance Value: 580 Ohm
Lead Channel Impedance Value: 690 Ohm
Lead Channel Pacing Threshold Amplitude: 0.5 V
Lead Channel Pacing Threshold Amplitude: 1 V
Lead Channel Pacing Threshold Amplitude: 1 V
Lead Channel Pacing Threshold Pulse Width: 0.5 ms
Lead Channel Pacing Threshold Pulse Width: 0.5 ms
Lead Channel Pacing Threshold Pulse Width: 0.8 ms
Lead Channel Sensing Intrinsic Amplitude: 11.8 mV
Lead Channel Sensing Intrinsic Amplitude: 4.7 mV
Lead Channel Setting Pacing Amplitude: 2.25 V
Lead Channel Setting Pacing Amplitude: 2.5 V
Lead Channel Setting Pacing Amplitude: 2.5 V
Lead Channel Setting Pacing Pulse Width: 0.5 ms
Lead Channel Setting Pacing Pulse Width: 0.8 ms
Lead Channel Setting Sensing Sensitivity: 2 mV
Pulse Gen Model: 3562
Pulse Gen Serial Number: 3861599

## 2023-06-07 DIAGNOSIS — E559 Vitamin D deficiency, unspecified: Secondary | ICD-10-CM | POA: Diagnosis not present

## 2023-06-07 DIAGNOSIS — E7800 Pure hypercholesterolemia, unspecified: Secondary | ICD-10-CM | POA: Diagnosis not present

## 2023-06-07 DIAGNOSIS — I509 Heart failure, unspecified: Secondary | ICD-10-CM | POA: Diagnosis not present

## 2023-06-07 DIAGNOSIS — I4819 Other persistent atrial fibrillation: Secondary | ICD-10-CM | POA: Diagnosis not present

## 2023-06-09 ENCOUNTER — Ambulatory Visit: Payer: Self-pay | Admitting: Cardiology

## 2023-06-12 ENCOUNTER — Ambulatory Visit: Payer: Medicare Other | Admitting: Cardiology

## 2023-06-24 ENCOUNTER — Other Ambulatory Visit: Payer: Self-pay | Admitting: Cardiology

## 2023-07-01 ENCOUNTER — Encounter: Payer: Self-pay | Admitting: Nurse Practitioner

## 2023-07-01 ENCOUNTER — Ambulatory Visit: Attending: Nurse Practitioner | Admitting: Nurse Practitioner

## 2023-07-01 VITALS — BP 112/60 | HR 64 | Ht 65.0 in | Wt 215.6 lb

## 2023-07-01 DIAGNOSIS — I5032 Chronic diastolic (congestive) heart failure: Secondary | ICD-10-CM | POA: Diagnosis not present

## 2023-07-01 DIAGNOSIS — I10 Essential (primary) hypertension: Secondary | ICD-10-CM

## 2023-07-01 DIAGNOSIS — E785 Hyperlipidemia, unspecified: Secondary | ICD-10-CM

## 2023-07-01 DIAGNOSIS — Z950 Presence of cardiac pacemaker: Secondary | ICD-10-CM | POA: Diagnosis not present

## 2023-07-01 DIAGNOSIS — R531 Weakness: Secondary | ICD-10-CM

## 2023-07-01 DIAGNOSIS — I4892 Unspecified atrial flutter: Secondary | ICD-10-CM

## 2023-07-01 DIAGNOSIS — R0602 Shortness of breath: Secondary | ICD-10-CM | POA: Diagnosis not present

## 2023-07-01 DIAGNOSIS — R5383 Other fatigue: Secondary | ICD-10-CM

## 2023-07-01 DIAGNOSIS — R600 Localized edema: Secondary | ICD-10-CM

## 2023-07-01 DIAGNOSIS — I2510 Atherosclerotic heart disease of native coronary artery without angina pectoris: Secondary | ICD-10-CM

## 2023-07-01 DIAGNOSIS — I4819 Other persistent atrial fibrillation: Secondary | ICD-10-CM

## 2023-07-01 NOTE — Progress Notes (Addendum)
 Office Visit    Patient Name: [name] Date of Encounter: 07/01/2023 PCP:  Trudy Vaughn FALCON, MD [facility]  Cardiologist:  Jayson Sierras, MD  Advanced Practice Provider:  Miriam Norris, NP Electrophysiologist:  Soyla Gladis Norton, MD   Chief Complaint and HPI    [name] is a 89 y.o. female with a hx of HFrEF, nonischemic cardiomyopathy, paroxysmal to persistent atrial fibrillation/A-flutter, CAD, hypertension, hyperlipidemia, ulcerative colitis, history of bladder cancer, breast cancer, pacemaker implanted in 2022, who presents today for 6 month follow-up.    Last seen by Dr. Sierras for follow-up on October 03, 2022.  At that time, she presented for preoperative cardiovascular risk assessment.  Was scheduled for left breast lumpectomy with Dr. Donnise at [facility].  Overall was doing well at that time.  12/03/2022 - Today she presents for follow-up with her younger sister.  She confirms to me that she was diagnosed with breast cancer a few months ago, had biopsy performed on August 28, and decided not to undergo surgery due to her advanced age.  She is currently taking Arimidex for treatment.  She tells me that her cancer is slow-growing. Continues to ambulate with walker. Denies any chest pain, shortness of breath, palpitations, syncope, presyncope, dizziness, orthopnea, PND, significant weight changes, acute bleeding, or claudication. Compliant with her medications and tolerating well.  Does note chronic, nonpitting edema to BLE, left greater than right, this is stable per her report.  07/01/2023 -  Here for 6 month follow-up. Admits to feeling weaker since I last saw her. She is not sleeping per her report. Wants to know if she can take melatonin. Feels more short of breath at times. No specific triggers. Denies any chest pain, palpitations, syncope, presyncope, dizziness, orthopnea, PND, swelling or significant weight changes, acute  bleeding, or claudication.  SH: Enjoys spending time with family.  Originally from [place] .  Has been married for over 70 years. She is the oldest of 9 children. Her husband is 66 years old.   EKGs/Labs/Other Studies Reviewed:   The following studies were reviewed today:   EKG:  EKG is not ordered today.   Echo 05/2022:  1. Left ventricular ejection fraction, by estimation, is 50 to 55%. The  left ventricle has low normal function. The left ventricle demonstrates  global hypokinesis. There is mild left ventricular hypertrophy. Left  ventricular diastolic parameters are consistent with Grade I diastolic dysfunction (impaired relaxation). The average left ventricular global longitudinal strain is -16.9 %. The global longitudinal strain is normal.   2. Right ventricular systolic function is normal. The right ventricular  size is normal. Tricuspid regurgitation signal is inadequate for assessing PA pressure.   3. The mitral valve is degenerative. Trivial mitral valve regurgitation.   4. The aortic valve is tricuspid. Aortic valve regurgitation is not  visualized. Aortic valve sclerosis/calcification is present, without any  evidence of aortic stenosis. Aortic valve mean gradient measures 4.0 mmHg.   5. Aortic dilatation noted. There is mild dilatation of the aortic root,  measuring 40 mm.   6. The inferior vena cava is normal in size with greater than 50%  respiratory variability, suggesting right atrial pressure of 3 mmHg.   Comparison(s): Prior images reviewed side by side. LVEF now low normal range at 50-55%.  Echo 03/2021: 1. Left ventricular ejection fraction, by estimation, is 40%. The left  ventricle has low normal function. The left ventricle demonstrates global  hypokinesis. There is mild left  ventricular hypertrophy. Left ventricular  diastolic parameters are  indeterminate.   2. RV not well visualized. Grossly appears normal in size and function. .  Right ventricular systolic  function was not well visualized. The right  ventricular size is not well visualized.   3. The mitral valve was not well visualized. No evidence of mitral valve  regurgitation. No evidence of mitral stenosis.   4. The aortic valve is tricuspid. Aortic valve regurgitation is not  visualized. No aortic stenosis is present.   5. The inferior vena cava is normal in size with greater than 50%  respiratory variability, suggesting right atrial pressure of 3 mmHg.   Comparison(s): LVEF 30-35%.   Review of Systems    All other systems reviewed and are otherwise negative except as noted above.  Physical Exam    VS:  BP 112/60   Pulse 64   Ht 5' 5 (1.651 m)   Wt 215 lb 9.6 oz (97.8 kg)   SpO2 96%   BMI 35.88 kg/m  , BMI Body mass index is 35.88 kg/m.  Wt Readings from Last 3 Encounters:  07/01/23 215 lb 9.6 oz (97.8 kg)  04/12/23 214 lb 9.6 oz (97.3 kg)  01/31/23 219 lb 9.6 oz (99.6 kg)    GEN: Obese, 89 y.o. female, in no acute distress. HEENT: normal. Neck: Supple, no JVD, carotid bruits, or masses. Cardiac: S1/S2, RRR, no murmurs, rubs, or gallops. No clubbing, cyanosis. Nonpitting edema, more pronounced (L >R - chronic) per her report.  Radials/PT 2+ and equal bilaterally.  Respiratory:  Respirations regular and unlabored, clear to auscultation bilaterally. MS: No deformity or atrophy. Skin: Pale, warm and dry, no rash. Neuro:  Strength and sensation are intact. Psych: Normal affect.  Assessment & Plan    HFmrEF -> HFimpEF, NICM, SHOB, hx of PPM, leg edema Stage C, NYHA class I-II symptoms. TTE 05/2022 showed improved EF to 50-55%. Euvolemic and well compensated on exam, but does admit to feeling weaker and shortness of breath intermittently. Instructed her to increase morning Lasix  and take 3 tablets in AM, 2 in the PM for 3 days, then return to normal dosing to help improve leg edema. Continue bisoprolol , Entresto , and Aldactone . Low sodium diet, fluid restriction <2L, and  daily weights encouraged. Educated to contact our office for weight gain of 2 lbs overnight or 5 lbs in one week. Normal device function seen on most recent remote device check. Continue to follow-up with EP. Will update Echo at this time.  Will obtain proBNP and BMET.   Paroxysmal to Persistent atrial fibrillation/A-flutter Denies any tachycardia/palpitations. HR well controlled today. Continue bisoprolol  and Xarelto  20 mg daily. Denies any bleeding issues. Heart healthy diet and regular cardiovascular exercise encouraged. Will obtain CBC, BMET, and Mag.   3. CAD Stable with no anginal symptoms. No indication for ischemic evaluation. Continue current medication regimen. Heart healthy diet and regular cardiovascular exercise encouraged.   4. HLD No recent lipid panel on file. Will request labs from PCP. Continue current medications. Heart healthy diet and regular cardiovascular exercise encouraged.   5. HTN BP stable. Continue current medications. Discussed to monitor BP at home at least 2 hours after medications and sitting for 5-10 minutes. Heart healthy diet and regular cardiovascular exercise encouraged.   6. Fatigue, weakness Most likely due to insomnia. Will check thyroid  panel. Recommended to f/u with PCP for further evaluation. Recommended PT and to start melatonin for insomnia.   Disposition: Follow up with Jayson Sierras, MD or  APP in 2-3 months or sooner if anything changes.   Signed, Almarie Crate, NP

## 2023-07-01 NOTE — Patient Instructions (Addendum)
 Medication Instructions:  Your physician has recommended you make the following change in your medication:  For 3 days please increase your Lasix  to 3 tablets in the morning and 2 tablets in then afternoon, after 3 days please return to normal dosing   Labwork: In 1-2 weeks at [facility]   Testing/Procedures: Your physician has requested that you have an echocardiogram. Echocardiography is a painless test that uses sound waves to create images of your heart. It provides your doctor with information about the size and shape of your heart and how well your heart's chambers and valves are working. This procedure takes approximately one hour. There are no restrictions for this procedure. Please do NOT wear cologne, perfume, aftershave, or lotions (deodorant is allowed). Please arrive 15 minutes prior to your appointment time.  Please note: We ask at that you not bring children with you during ultrasound (echo/ vascular) testing. Due to room size and safety concerns, children are not allowed in the ultrasound rooms during exams. Our front office staff cannot provide observation of children in our lobby area while testing is being conducted. An adult accompanying a patient to their appointment will only be allowed in the ultrasound room at the discretion of the ultrasound technician under special circumstances. We apologize for any inconvenience.  Follow-Up: Your physician recommends that you schedule a follow-up appointment in: 2-3 Months   Any Other Special Instructions Will Be Listed Below (If Applicable).  If you need a refill on your cardiac medications before your next appointment, please call your pharmacy.

## 2023-07-03 DIAGNOSIS — R9431 Abnormal electrocardiogram [ECG] [EKG]: Secondary | ICD-10-CM | POA: Diagnosis not present

## 2023-07-03 DIAGNOSIS — R0600 Dyspnea, unspecified: Secondary | ICD-10-CM | POA: Diagnosis not present

## 2023-07-03 DIAGNOSIS — R5381 Other malaise: Secondary | ICD-10-CM | POA: Diagnosis not present

## 2023-07-05 ENCOUNTER — Ambulatory Visit: Payer: Self-pay | Admitting: Nurse Practitioner

## 2023-07-08 DIAGNOSIS — I509 Heart failure, unspecified: Secondary | ICD-10-CM | POA: Diagnosis not present

## 2023-07-08 DIAGNOSIS — I4819 Other persistent atrial fibrillation: Secondary | ICD-10-CM | POA: Diagnosis not present

## 2023-07-08 DIAGNOSIS — E7800 Pure hypercholesterolemia, unspecified: Secondary | ICD-10-CM | POA: Diagnosis not present

## 2023-07-08 DIAGNOSIS — E559 Vitamin D deficiency, unspecified: Secondary | ICD-10-CM | POA: Diagnosis not present

## 2023-07-22 ENCOUNTER — Encounter (INDEPENDENT_AMBULATORY_CARE_PROVIDER_SITE_OTHER): Payer: Self-pay | Admitting: Gastroenterology

## 2023-07-22 ENCOUNTER — Ambulatory Visit (INDEPENDENT_AMBULATORY_CARE_PROVIDER_SITE_OTHER): Admitting: Gastroenterology

## 2023-07-22 VITALS — BP 107/68 | HR 60 | Temp 97.3°F | Ht 63.0 in | Wt 216.0 lb

## 2023-07-22 DIAGNOSIS — K589 Irritable bowel syndrome without diarrhea: Secondary | ICD-10-CM | POA: Insufficient documentation

## 2023-07-22 DIAGNOSIS — Z8719 Personal history of other diseases of the digestive system: Secondary | ICD-10-CM | POA: Diagnosis not present

## 2023-07-22 DIAGNOSIS — K5100 Ulcerative (chronic) pancolitis without complications: Secondary | ICD-10-CM | POA: Diagnosis not present

## 2023-07-22 NOTE — Progress Notes (Signed)
 Remote pacemaker transmission.

## 2023-07-22 NOTE — Patient Instructions (Signed)
 Continue balsalazide 2250 mg 3 times a day Try to avoid stressful situations to avoid triggering abdominal pain episodes

## 2023-07-22 NOTE — Progress Notes (Unsigned)
 [name], M.D. Gastroenterology & Hepatology [facility] [address] [place], [place] [address]  Primary Care Physician: Trudy Vaughn FALCON, MD [address] [place] [address]  I will communicate my assessment and recommendations to the referring MD via EMR.  Problems: Panulcerative colitis in remission History of asymptomatic gallstones IBS   History of Present Illness: [name] is a 89 y.o. female, with Pmh pan ulcerative colitis, bladder cancer, heart failure with reduced ejection fraction, history of pacemaker placement, cardiomyopathy, coronary artery disease status post stent placement, depression, breast cancer, atrial fibrillation, macular degeneration, hypertension, who presents for follow-up of ulcerative colitis and abdominal pain.  The patient was last seen on 01/31/2023. At that time, the patient was started on Flexeril  for flank pain.  Patient had CMP checked which was completely normal with total bilirubin of 0.9.  Patient reports that if she gets too stressed, her abdomen will hurt. She has not been eating too much and has to force herself to take the medications. She states that if she has a stressfful event, she will have 4 normal consistency Bms in a day and then it resolves, but this is very seldom.   The patient denies having any nausea, vomiting, fever, chills, hematochezia, melena, hematemesis, jaundice, pruritus. Even though she is not eating a lot, she is eating chocolate so she is keeping her weight stable.  Last labs were drawn on 07/03/2023.  CBC showed WBC 7.3, hemoglobin 12.9 and platelets 318.  BMP with sodium 136, potassium 4.2, BUN 17, creatinine 0.79.  Last EGD: 06/14/2017, 2 cm hiatal hernia, normal stomach and duodenum.  A capsule endoscopy was deployed. Capsule endoscopy performed on 06/15/2017 which showed small bowel small ulcer with brown eschar but no active bleeding.  There was also small  submucosal lesion concerning for a submucosal lipoma.  Last Colonoscopy: June 2017 with removal of 5 small tubular adenomas (polyps were removed from cecum between 4 to 7 mm, sigmoid colon and splenic flexure).  UC was in remission.  Past Medical History: Past Medical History:  Diagnosis Date   Arthritis    Atrial fibrillation (HCC)    Atrial flutter (HCC)    Bladder cancer (HCC)    Breast cancer (HCC)    Cardiomyopathy (HCC)    Coronary atherosclerosis of native coronary artery    a. BMS RCA May 2014 - [place] stent. b. Cath 04/2017 - patent stent, minimal CAD otherwise.   Depression    Essential hypertension    GI bleeding 06/2017   a. melena/small bowel ulcer by capsule endo 06/2017.   Hyperlipemia    Macular degeneration    NICM (nonischemic cardiomyopathy) (HCC)    Persistent atrial fibrillation (HCC)    Presence of permanent cardiac pacemaker    Ulcerative colitis     Past Surgical History: Past Surgical History:  Procedure Laterality Date   ABDOMINAL HYSTERECTOMY     APPENDECTOMY     Bilateral knee replacements      2007, 2008   BIV PACEMAKER INSERTION CRT-P N/A 03/02/2020   Procedure: BIV PACEMAKER INSERTION CRT-P;  Surgeon: Inocencio Soyla Lunger, MD;  Location: [facility] INVASIVE CV LAB;  Service: Cardiovascular;  Laterality: N/A;   BLADDER SURGERY     CARDIOVERSION N/A 02/04/2017   Procedure: CARDIOVERSION;  Surgeon: Alvan Dorn FALCON, MD;  Location: [facility] ENDO SUITE;  Service: Endoscopy;  Laterality: N/A;   CARDIOVERSION N/A 02/26/2017   Procedure: CARDIOVERSION;  Surgeon: Debera Jayson MATSU, MD;  Location:  [facility] ORS;  Service: Cardiovascular;  Laterality: N/A;   CARDIOVERSION N/A 08/07/2017   Procedure: CARDIOVERSION;  Surgeon: Mona Vinie BROCKS, MD;  Location: [facility] ENDOSCOPY;  Service: Cardiovascular;  Laterality: N/A;   COLONOSCOPY N/A 06/15/2015   Procedure: COLONOSCOPY;  Surgeon: Claudis RAYMOND Rivet, MD;  Location: [facility] ENDO SUITE;  Service: Endoscopy;  Laterality: N/A;  210    CYSTOSCOPY N/A 11/07/2020   Procedure: CYSTOSCOPY;  Surgeon: Sherrilee Belvie CROME, MD;  Location: [facility] ORS;  Service: Urology;  Laterality: N/A;   CYSTOSCOPY W/ RETROGRADES Bilateral 05/14/2016   Procedure: CYSTOSCOPY WITH BILATERAL RETROGRADE PYELOGRAM;  Surgeon: Renda Glance, MD;  Location: [facility] ORS;  Service: Urology;  Laterality: Bilateral;  GENERAL ANESTHESIA WITH PARALYSIS   ESOPHAGOGASTRODUODENOSCOPY (EGD) WITH PROPOFOL  N/A 06/14/2017   Procedure: ESOPHAGOGASTRODUODENOSCOPY (EGD) WITH PROPOFOL ;  Surgeon: Rivet Claudis RAYMOND, MD;  Location: [facility] ENDO [facility];  Service: Endoscopy;  Laterality: N/A;   GIVENS CAPSULE STUDY  06/14/2017   Procedure: GIVENS CAPSULE STUDY;  Surgeon: Rivet Claudis RAYMOND, MD;  Location: [facility];  Service: Endoscopy;;   LEFT HEART CATHETERIZATION WITH CORONARY ANGIOGRAM N/A 10/30/2013   Procedure: LEFT HEART CATHETERIZATION WITH CORONARY ANGIOGRAM;  Surgeon: Lonni JONETTA Cash, MD;  Location: [facility] CATH LAB;  Service: Cardiovascular;  Laterality: N/A;   RIGHT/LEFT HEART CATH AND CORONARY ANGIOGRAPHY N/A 05/03/2017   Procedure: RIGHT/LEFT HEART CATH AND CORONARY ANGIOGRAPHY;  Surgeon: Anner Alm ORN, MD;  Location: [facility] INVASIVE CV LAB;  Service: Cardiovascular;  Laterality: N/A;   TEE WITHOUT CARDIOVERSION N/A 02/04/2017   Procedure: TRANSESOPHAGEAL ECHOCARDIOGRAM (TEE) WITH PROPOL;  Surgeon: Alvan Dorn FALCON, MD;  Location: [facility];  Service: Endoscopy;  Laterality: N/A;   TONSILLECTOMY     TOTAL KNEE ARTHROPLASTY     TRANSURETHRAL RESECTION OF BLADDER TUMOR N/A 05/14/2016   Procedure: TRANSURETHRAL RESECTION OF BLADDER TUMOR (TURBT);  Surgeon: Renda Glance, MD;  Location: [facility] ORS;  Service: Urology;  Laterality: N/A;  GENERAL ANESTHESIA WITH PARALYSIS   TRANSURETHRAL RESECTION OF BLADDER TUMOR N/A 11/07/2020   Procedure: TRANSURETHRAL RESECTION OF BLADDER TUMOR (TURBT);  Surgeon: Sherrilee Belvie CROME, MD;  Location: [facility] ORS;  Service: Urology;  Laterality: N/A;   YAG LASER  APPLICATION Bilateral 11/10/2012   Procedure: YAG LASER APPLICATION;  Surgeon: Dow FALCON Burke, MD;  Location: [facility] ORS;  Service: Ophthalmology;  Laterality: Bilateral;    Family History: Family History  Problem Relation Age of Onset   CAD Father    Diabetes Father    Heart attack Father     Social History: Social History   Tobacco Use  Smoking Status Never   Passive exposure: Past  Smokeless Tobacco Never  Tobacco Comments   Never smoke 10/04/21   Social History   Substance and Sexual Activity  Alcohol Use No   Alcohol/week: 0.0 standard drinks of alcohol   Social History   Substance and Sexual Activity  Drug Use No    Allergies: Allergies  Allergen Reactions   Chlor-Trimeton [Chlorpheniramine] Shortness Of Breath   Carvedilol  Itching and Rash    Facial rash/itching   Demerol  Rash   Penicillins Rash and Other (See Comments)    REACTION: rash, years ago Has patient had a PCN reaction causing immediate rash, facial/tongue/throat swelling, SOB or lightheadedness with hypotension: Yes Has patient had a PCN reaction causing severe rash involving mucus membranes or skin necrosis: No Has patient had a PCN reaction that required hospitalization No Has patient had a PCN reaction occurring within the last 10 years: No If all  of the above answers are NO, then may proceed with Cephalosporin use.     Medications: Current Outpatient Medications  Medication Sig Dispense Refill   acetaminophen  (TYLENOL ) 500 MG tablet Take 500 mg by mouth every 6 (six) hours as needed for moderate pain.     anastrozole (ARIMIDEX) 1 MG tablet Take 1 mg by mouth daily.     atorvastatin  (LIPITOR) 20 MG tablet TAKE 1 TABLET BY MOUTH AT BEDTIME 90 tablet 1   balsalazide (COLAZAL ) 750 MG capsule TAKE THREE CAPSULES BY MOUTH THREE TIMES DAILY 270 capsule 11   bisoprolol  (ZEBETA ) 5 MG tablet TAKE 1/2 TABLET BY MOUTH DAILY 15 tablet 0   Carboxymethylcellulose Sodium (THERATEARS) 0.25 % SOLN Place 1  drop into both eyes in the morning and at bedtime.     CRANBERRY PO Take 4,200 mg by mouth daily. With vit C     cyclobenzaprine  (FLEXERIL ) 5 MG tablet Take 1 tablet (5 mg total) by mouth 3 (three) times daily as needed for muscle spasms. 30 tablet 0   dofetilide  (TIKOSYN ) 250 MCG capsule TAKE ONE CAPSULE BY MOUTH TWICE DAILY 60 capsule 1   furosemide  (LASIX ) 20 MG tablet TAKE 2 TABLETS BY MOUTH TWICE DAILY 360 tablet 1   meclizine  (ANTIVERT ) 25 MG tablet Take 25 mg by mouth 3 (three) times daily as needed.     mirabegron  ER (MYRBETRIQ ) 50 MG TB24 tablet Take 1 tablet (50 mg total) by mouth daily. 30 tablet 11   Multiple Vitamins-Minerals (ICAPS AREDS 2 PO) Take 2 tablets by mouth daily.     nitroGLYCERIN  (NITROSTAT ) 0.4 MG SL tablet Place 0.4 mg under the tongue every 5 (five) minutes as needed for chest pain.     OVER THE COUNTER MEDICATION Vit d one a day     potassium chloride  SA (KLOR-CON  M) 20 MEQ tablet TAKE 1 TABLET BY MOUTH TWICE DAILY 180 tablet 3   psyllium (METAMUCIL SMOOTH TEXTURE) 58.6 % powder Take 1 packet by mouth at bedtime. 283 g 12   sacubitril -valsartan  (ENTRESTO ) 24-26 MG TAKE 1 TABLET BY MOUTH TWICE DAILY 180 tablet 1   sodium chloride  (MURO 128) 2 % ophthalmic solution Place 1 drop into both eyes in the morning and at bedtime.     spironolactone  (ALDACTONE ) 25 MG tablet TAKE 1/2 TABLET BY MOUTH EVERY DAY 15 tablet 2   XARELTO  20 MG TABS tablet TAKE 1 TABLET BY MOUTH DAILY WITH SUPPER 90 tablet 1   No current facility-administered medications for this visit.    Review of Systems: GENERAL: negative for malaise, night sweats HEENT: No changes in hearing or vision, no nose bleeds or other nasal problems. NECK: Negative for lumps, goiter, pain and significant neck swelling RESPIRATORY: Negative for cough, wheezing CARDIOVASCULAR: Negative for chest pain, leg swelling, palpitations, orthopnea GI: SEE HPI MUSCULOSKELETAL: Negative for joint pain or swelling, back pain, and  muscle pain. SKIN: Negative for lesions, rash PSYCH: Negative for sleep disturbance, mood disorder and recent psychosocial stressors. HEMATOLOGY Negative for prolonged bleeding, bruising easily, and swollen nodes. ENDOCRINE: Negative for cold or heat intolerance, polyuria, polydipsia and goiter. NEURO: negative for tremor, gait imbalance, syncope and seizures. The remainder of the review of systems is noncontributory.   Physical Exam: BP 107/68 (BP Location: Left Arm, Patient Position: Sitting, Cuff Size: Large)   Pulse 60   Temp (!) 97.3 F (36.3 C) (Temporal)   Ht 5' 3 (1.6 m)   Wt 216 lb (98 kg)   BMI 38.26 kg/m  GENERAL: The patient is AO x3, in no acute distress. HEENT: Head is normocephalic and atraumatic. EOMI are intact. Mouth is well hydrated and without lesions. NECK: Supple. No masses LUNGS: Clear to auscultation. No presence of rhonchi/wheezing/rales. Adequate chest expansion HEART: RRR, normal s1 and s2. ABDOMEN: Soft, nontender, no guarding, no peritoneal signs, and nondistended. BS +. No masses. EXTREMITIES: Without any cyanosis, clubbing, rash, lesions or edema. NEUROLOGIC: AOx3, no focal motor deficit. SKIN: no jaundice, no rashes  Imaging/Labs: as above  I personally reviewed and interpreted the available labs, imaging and endoscopic files.  Impression and Plan: [name] is a 89 y.o. female, with Pmh pan ulcerative colitis, bladder cancer, heart failure with reduced ejection fraction, history of pacemaker placement, cardiomyopathy, coronary artery disease status post stent placement, depression, breast cancer, atrial fibrillation, macular degeneration, hypertension, who presents for follow-up of ulcerative colitis and abdominal pain.  Patient has presented adequate control of her ulcerative colitis with the use of 5-ASA.  Has not presented any red flag signs, although location she has some episodes of abdominal pain when she gets stressed.  I considered  this is related to IBS most likely, as overall her symptoms are controlled on a regular basis.  She should continue taking balsalazide at the same dosage to avoid any breakthrough episodes of ulcerative colitis.  She is up-to-date in terms of her blood workup for surveillance while taking 5-ASA compounds.  -Continue balsalazide 2250 mg 3 times a day -Try to avoid stressful situations to avoid triggering abdominal pain episodes  All questions were answered.      [name] Fortune, MD Gastroenterology and Hepatology [facility] Gastroenterology

## 2023-07-22 NOTE — Addendum Note (Signed)
 Addended by: TAWNI DRILLING D on: 07/22/2023 10:16 AM   Modules accepted: Orders

## 2023-07-24 ENCOUNTER — Ambulatory Visit: Attending: Nurse Practitioner

## 2023-07-24 DIAGNOSIS — R0602 Shortness of breath: Secondary | ICD-10-CM | POA: Diagnosis not present

## 2023-07-24 LAB — ECHOCARDIOGRAM COMPLETE
AR max vel: 3.43 cm2
AV Area VTI: 2.91 cm2
AV Area mean vel: 3.05 cm2
AV Mean grad: 3 mmHg
AV Peak grad: 7 mmHg
Ao pk vel: 1.32 m/s
Area-P 1/2: 2.9 cm2
Calc EF: 50.5 %
MV VTI: 2.2 cm2
S' Lateral: 3.9 cm
Single Plane A2C EF: 44.6 %
Single Plane A4C EF: 57.2 %

## 2023-07-28 ENCOUNTER — Other Ambulatory Visit: Payer: Self-pay | Admitting: Cardiology

## 2023-08-01 ENCOUNTER — Other Ambulatory Visit: Payer: Self-pay | Admitting: Cardiology

## 2023-08-08 DIAGNOSIS — E7800 Pure hypercholesterolemia, unspecified: Secondary | ICD-10-CM | POA: Diagnosis not present

## 2023-08-08 DIAGNOSIS — I4819 Other persistent atrial fibrillation: Secondary | ICD-10-CM | POA: Diagnosis not present

## 2023-08-08 DIAGNOSIS — E559 Vitamin D deficiency, unspecified: Secondary | ICD-10-CM | POA: Diagnosis not present

## 2023-08-08 DIAGNOSIS — I509 Heart failure, unspecified: Secondary | ICD-10-CM | POA: Diagnosis not present

## 2023-08-09 DIAGNOSIS — Z20828 Contact with and (suspected) exposure to other viral communicable diseases: Secondary | ICD-10-CM | POA: Diagnosis not present

## 2023-08-09 DIAGNOSIS — K5190 Ulcerative colitis, unspecified, without complications: Secondary | ICD-10-CM | POA: Diagnosis not present

## 2023-08-09 DIAGNOSIS — R195 Other fecal abnormalities: Secondary | ICD-10-CM | POA: Diagnosis not present

## 2023-08-09 DIAGNOSIS — R110 Nausea: Secondary | ICD-10-CM | POA: Diagnosis not present

## 2023-08-09 DIAGNOSIS — Z6836 Body mass index (BMI) 36.0-36.9, adult: Secondary | ICD-10-CM | POA: Diagnosis not present

## 2023-09-03 ENCOUNTER — Ambulatory Visit (INDEPENDENT_AMBULATORY_CARE_PROVIDER_SITE_OTHER): Payer: Medicare Other

## 2023-09-03 DIAGNOSIS — I428 Other cardiomyopathies: Secondary | ICD-10-CM

## 2023-09-04 ENCOUNTER — Ambulatory Visit: Payer: Self-pay | Admitting: Cardiology

## 2023-09-04 LAB — CUP PACEART REMOTE DEVICE CHECK
Battery Remaining Longevity: 39 mo
Battery Remaining Percentage: 49 %
Battery Voltage: 2.96 V
Brady Statistic AP VP Percent: 87 %
Brady Statistic AP VS Percent: 1 %
Brady Statistic AS VP Percent: 13 %
Brady Statistic AS VS Percent: 1 %
Brady Statistic RA Percent Paced: 68 %
Date Time Interrogation Session: 20250826020012
Implantable Lead Connection Status: 753985
Implantable Lead Connection Status: 753985
Implantable Lead Connection Status: 753985
Implantable Lead Implant Date: 20220223
Implantable Lead Implant Date: 20220223
Implantable Lead Implant Date: 20220223
Implantable Lead Location: 753858
Implantable Lead Location: 753859
Implantable Lead Location: 753860
Implantable Pulse Generator Implant Date: 20220223
Lead Channel Impedance Value: 510 Ohm
Lead Channel Impedance Value: 580 Ohm
Lead Channel Impedance Value: 640 Ohm
Lead Channel Pacing Threshold Amplitude: 0.5 V
Lead Channel Pacing Threshold Amplitude: 1 V
Lead Channel Pacing Threshold Amplitude: 1 V
Lead Channel Pacing Threshold Pulse Width: 0.5 ms
Lead Channel Pacing Threshold Pulse Width: 0.5 ms
Lead Channel Pacing Threshold Pulse Width: 0.8 ms
Lead Channel Sensing Intrinsic Amplitude: 10.6 mV
Lead Channel Sensing Intrinsic Amplitude: 4 mV
Lead Channel Setting Pacing Amplitude: 2.25 V
Lead Channel Setting Pacing Amplitude: 2.5 V
Lead Channel Setting Pacing Amplitude: 2.5 V
Lead Channel Setting Pacing Pulse Width: 0.5 ms
Lead Channel Setting Pacing Pulse Width: 0.8 ms
Lead Channel Setting Sensing Sensitivity: 2 mV
Pulse Gen Model: 3562
Pulse Gen Serial Number: 3861599

## 2023-09-06 DIAGNOSIS — I4819 Other persistent atrial fibrillation: Secondary | ICD-10-CM | POA: Diagnosis not present

## 2023-09-06 DIAGNOSIS — E7800 Pure hypercholesterolemia, unspecified: Secondary | ICD-10-CM | POA: Diagnosis not present

## 2023-09-06 DIAGNOSIS — E559 Vitamin D deficiency, unspecified: Secondary | ICD-10-CM | POA: Diagnosis not present

## 2023-09-06 DIAGNOSIS — I509 Heart failure, unspecified: Secondary | ICD-10-CM | POA: Diagnosis not present

## 2023-09-23 ENCOUNTER — Other Ambulatory Visit: Payer: Self-pay | Admitting: Cardiology

## 2023-09-23 NOTE — Telephone Encounter (Signed)
 Prescription refill request for Xarelto  received.  Indication: AF Last office visit: 07/01/23  [name] Crate NP Weight: 97.8kg Age: 89  Scr: 0.79 on 07/03/23 CrCl: 71.61  Based on above findings Xarelto  20mg  daily is the appropriate dose.  Refill approved.

## 2023-09-24 NOTE — Progress Notes (Signed)
 Remote PPM Transmission

## 2023-09-30 DIAGNOSIS — R5383 Other fatigue: Secondary | ICD-10-CM | POA: Diagnosis not present

## 2023-09-30 DIAGNOSIS — R7303 Prediabetes: Secondary | ICD-10-CM | POA: Diagnosis not present

## 2023-09-30 DIAGNOSIS — R739 Hyperglycemia, unspecified: Secondary | ICD-10-CM | POA: Diagnosis not present

## 2023-09-30 DIAGNOSIS — R7989 Other specified abnormal findings of blood chemistry: Secondary | ICD-10-CM | POA: Diagnosis not present

## 2023-10-04 ENCOUNTER — Ambulatory Visit: Admitting: Nurse Practitioner

## 2023-10-07 DIAGNOSIS — K5190 Ulcerative colitis, unspecified, without complications: Secondary | ICD-10-CM | POA: Diagnosis not present

## 2023-10-07 DIAGNOSIS — E559 Vitamin D deficiency, unspecified: Secondary | ICD-10-CM | POA: Diagnosis not present

## 2023-10-07 DIAGNOSIS — Z23 Encounter for immunization: Secondary | ICD-10-CM | POA: Diagnosis not present

## 2023-10-07 DIAGNOSIS — Z6837 Body mass index (BMI) 37.0-37.9, adult: Secondary | ICD-10-CM | POA: Diagnosis not present

## 2023-10-07 DIAGNOSIS — R110 Nausea: Secondary | ICD-10-CM | POA: Diagnosis not present

## 2023-10-07 DIAGNOSIS — R195 Other fecal abnormalities: Secondary | ICD-10-CM | POA: Diagnosis not present

## 2023-10-07 DIAGNOSIS — Z1389 Encounter for screening for other disorder: Secondary | ICD-10-CM | POA: Diagnosis not present

## 2023-10-07 DIAGNOSIS — Z0001 Encounter for general adult medical examination with abnormal findings: Secondary | ICD-10-CM | POA: Diagnosis not present

## 2023-10-07 DIAGNOSIS — Z1331 Encounter for screening for depression: Secondary | ICD-10-CM | POA: Diagnosis not present

## 2023-10-08 DIAGNOSIS — E559 Vitamin D deficiency, unspecified: Secondary | ICD-10-CM | POA: Diagnosis not present

## 2023-10-08 DIAGNOSIS — E7800 Pure hypercholesterolemia, unspecified: Secondary | ICD-10-CM | POA: Diagnosis not present

## 2023-10-08 DIAGNOSIS — I509 Heart failure, unspecified: Secondary | ICD-10-CM | POA: Diagnosis not present

## 2023-10-08 DIAGNOSIS — I4819 Other persistent atrial fibrillation: Secondary | ICD-10-CM | POA: Diagnosis not present

## 2023-10-11 ENCOUNTER — Ambulatory Visit: Attending: Cardiovascular Disease | Admitting: Cardiovascular Disease

## 2023-10-11 ENCOUNTER — Encounter: Payer: Self-pay | Admitting: Cardiovascular Disease

## 2023-10-11 VITALS — BP 110/60 | HR 62 | Ht 63.0 in | Wt 215.6 lb

## 2023-10-11 DIAGNOSIS — Z950 Presence of cardiac pacemaker: Secondary | ICD-10-CM

## 2023-10-11 NOTE — Progress Notes (Signed)
   Electrophysiology Office Note   Date:  10/11/2023   ID:  [name], [name] 1930-02-19, MRN [id number]  PCP:  Trudy Vaughn FALCON, MD  Cardiologist:  Debera Primary Electrophysiologist:  Eulas FORBES Furbish, MD    No chief complaint on file.    History of Present Illness: [name] is a 89 y.o. female who is being seen today for the evaluation of atrial fibrillation at the request of Trudy Vaughn FALCON, MD. Presenting today for electrophysiology evaluation.    She has a history of atrial flutter, atrial fibrillation, coronary artery disease status post RCA stent, nonischemic cardiomyopathy.  She has an ejection fraction of 25%.  She has now status post Saint Jude CRT-P implanted 03/02/2020.  She reports that she is doing very well. She does not sense her AF -- no palpitations. She does have episodic fatigue and shortness of breath though this is not necessarily correlated with AF.  she has no device related complaints -- no new tenderness, drainage, redness.   PHYSICAL EXAM: VS:  Ht 5' 3 (1.6 m)   Wt 215 lb 9.6 oz (97.8 kg)   BMI 38.19 kg/m  , BMI Body mass index is 38.19 kg/m. Gen: Appears comfortable, well-nourished CV: RRR, no dependent edema The device site is normal -- no tenderness, edema, drainage, redness, threatened erosion. Pulm: breathing easily   EKG:        Personal review of the device interrogation today. Results in Paceart   Recent Labs: 01/31/2023: ALT 15; BUN 19; Creat 0.86; Potassium 4.8; Sodium 138    Lipid Panel  No results found for: CHOL, TRIG, HDL, CHOLHDL, VLDL, LDLCALC, LDLDIRECT   Wt Readings from Last 3 Encounters:  10/11/23 215 lb 9.6 oz (97.8 kg)  07/22/23 216 lb (98 kg)  07/01/23 215 lb 9.6 oz (97.8 kg)      Other studies Reviewed: TTE May 28, 2022 EF 50 to 55%.  Grade 1 diastolic dysfunction.  LHC 05/03/17 Hemodynamic findings consistent with mild secondary pulmonary hypertension. Patient has severe  nonischemic cardiomyopathy Moderate to Severely reduced CO/CI LV end diastolic pressure is moderately elevated. Mid RCA stent widely patent There is mild (2+) mitral regurgitation.  Zio 09/16/18 personally reviewed Max 197 bpm 09:12am, 08/24 Min 38 bpm 03:09am, 08/24 Avg 68 bpm Rare PVCs Frequent PACs Atrial fibrillation: None Multiple runs of SVT, longest 11 seconds, fastest rate 197 for 8 beats  ASSESSMENT AND PLAN:  Persistent atrial fibrillation:  Currently on Xarelto  and dofetilide .   High risk medication monitoring.  CHA2DS2-VASc of 4.   She has an increased but tolerable burden of atrial fibrillation on device - 16% today. Rates are controlled   Base metabolic panel January 2025 reviewed, EKG today reviewed  Abbott CRT pacemaker I reviewed today's device interrogation.  See Paceart for details She is not device-dependent today  Secondary hypercoagulable state:  continue xarelto  20mg  daily.  Recent CBC reviewed.     Nonischemic cardiomyopathy:  EF has recovered  Coronary artery disease:  Status post RCA stent in 2014.  No current chest pain.  Current medicines are reviewed at length with the patient today.   The patient does not have concerns regarding her medicines.  The following changes were made today: none  Labs/ tests ordered today include:  Orders Placed This Encounter  Procedures   EKG 12-Lead    Disposition:   FU in 6 months  Signed, Eulas FORBES Furbish, MD  10/11/2023 12:55 PM

## 2023-10-11 NOTE — Patient Instructions (Signed)

## 2023-10-23 ENCOUNTER — Encounter (INDEPENDENT_AMBULATORY_CARE_PROVIDER_SITE_OTHER): Payer: Self-pay | Admitting: Gastroenterology

## 2023-11-08 DIAGNOSIS — I4819 Other persistent atrial fibrillation: Secondary | ICD-10-CM | POA: Diagnosis not present

## 2023-11-08 DIAGNOSIS — E559 Vitamin D deficiency, unspecified: Secondary | ICD-10-CM | POA: Diagnosis not present

## 2023-11-08 DIAGNOSIS — I509 Heart failure, unspecified: Secondary | ICD-10-CM | POA: Diagnosis not present

## 2023-11-08 DIAGNOSIS — E7800 Pure hypercholesterolemia, unspecified: Secondary | ICD-10-CM | POA: Diagnosis not present

## 2023-11-11 DIAGNOSIS — Z170 Estrogen receptor positive status [ER+]: Secondary | ICD-10-CM | POA: Diagnosis not present

## 2023-11-11 DIAGNOSIS — C50412 Malignant neoplasm of upper-outer quadrant of left female breast: Secondary | ICD-10-CM | POA: Diagnosis not present

## 2023-11-18 ENCOUNTER — Other Ambulatory Visit: Payer: Self-pay | Admitting: Urology

## 2023-11-18 ENCOUNTER — Other Ambulatory Visit: Payer: Self-pay | Admitting: Cardiology

## 2023-11-20 ENCOUNTER — Encounter: Payer: Self-pay | Admitting: Urology

## 2023-11-20 ENCOUNTER — Ambulatory Visit: Payer: Medicare Other | Admitting: Urology

## 2023-11-20 VITALS — BP 117/71 | HR 60

## 2023-11-20 DIAGNOSIS — N3281 Overactive bladder: Secondary | ICD-10-CM

## 2023-11-20 DIAGNOSIS — C678 Malignant neoplasm of overlapping sites of bladder: Secondary | ICD-10-CM

## 2023-11-20 DIAGNOSIS — N3941 Urge incontinence: Secondary | ICD-10-CM

## 2023-11-20 LAB — URINALYSIS, ROUTINE W REFLEX MICROSCOPIC
Bilirubin, UA: NEGATIVE
Glucose, UA: NEGATIVE
Ketones, UA: NEGATIVE
Leukocytes,UA: NEGATIVE
Nitrite, UA: NEGATIVE
Protein,UA: NEGATIVE
RBC, UA: NEGATIVE
Specific Gravity, UA: <1.005 — ABNORMAL LOW (ref 1.005–1.030)
Urobilinogen, Ur: 0.2 mg/dL (ref 0.2–1.0)
pH, UA: 6 (ref 5.0–7.5)

## 2023-11-20 MED ORDER — MIRABEGRON ER 50 MG PO TB24: 50.0000 mg | ORAL_TABLET | Freq: Every day | ORAL | 11 refills | Status: AC

## 2023-11-20 MED ORDER — CIPROFLOXACIN HCL 500 MG PO TABS: 500.0000 mg | ORAL_TABLET | Freq: Once | ORAL | Status: DC

## 2023-11-20 MED ORDER — DOXYCYCLINE HYCLATE 100 MG PO CAPS: 100.0000 mg | ORAL_CAPSULE | Freq: Once | ORAL | 0 refills | Status: AC

## 2023-11-20 NOTE — Progress Notes (Signed)
 11/20/2023 10:46 AM   [name] July 21, 1930 [id number]  Referring provider: Trudy Vaughn FALCON, MD [address] [place],  [place] [address]     HPI: [name] is a 89yo here for followup for OAB and bladder cancer. She is doing well on mirabegron  50mg  daily. Rare incontinence episodes. Nocturia 1-2x. No straining to urinate. No hematuria or dysuira   PMH: Past Medical History:  Diagnosis Date   Arthritis    Atrial fibrillation (HCC)    Atrial flutter (HCC)    Bladder cancer (HCC)    Breast cancer (HCC)    Cardiomyopathy (HCC)    Coronary atherosclerosis of native coronary artery    a. BMS RCA May 2014 - [place] stent. b. Cath 04/2017 - patent stent, minimal CAD otherwise.   Depression    Essential hypertension    GI bleeding 06/2017   a. melena/small bowel ulcer by capsule endo 06/2017.   Hyperlipemia    Macular degeneration    NICM (nonischemic cardiomyopathy) (HCC)    Persistent atrial fibrillation (HCC)    Presence of permanent cardiac pacemaker    Ulcerative colitis     Surgical History: Past Surgical History:  Procedure Laterality Date   ABDOMINAL HYSTERECTOMY     APPENDECTOMY     Bilateral knee replacements      2007, 2008   BIV PACEMAKER INSERTION CRT-P N/A 03/02/2020   Procedure: BIV PACEMAKER INSERTION CRT-P;  Surgeon: Inocencio Soyla Lunger, MD;  Location: [facility] INVASIVE CV LAB;  Service: Cardiovascular;  Laterality: N/A;   BLADDER SURGERY     CARDIOVERSION N/A 02/04/2017   Procedure: CARDIOVERSION;  Surgeon: Alvan Dorn FALCON, MD;  Location: [facility];  Service: Endoscopy;  Laterality: N/A;   CARDIOVERSION N/A 02/26/2017   Procedure: CARDIOVERSION;  Surgeon: Debera Jayson MATSU, MD;  Location: [facility] ORS;  Service: Cardiovascular;  Laterality: N/A;   CARDIOVERSION N/A 08/07/2017   Procedure: CARDIOVERSION;  Surgeon: Mona Vinie BROCKS, MD;  Location: [facility] ENDOSCOPY;  Service: Cardiovascular;  Laterality: N/A;   COLONOSCOPY N/A 06/15/2015   Procedure:  COLONOSCOPY;  Surgeon: Claudis RAYMOND Rivet, MD;  Location: [facility];  Service: Endoscopy;  Laterality: N/A;  210   CYSTOSCOPY N/A 11/07/2020   Procedure: CYSTOSCOPY;  Surgeon: Sherrilee [name] CROME, MD;  Location: [facility] ORS;  Service: Urology;  Laterality: N/A;   CYSTOSCOPY W/ RETROGRADES Bilateral 05/14/2016   Procedure: CYSTOSCOPY WITH BILATERAL RETROGRADE PYELOGRAM;  Surgeon: Renda Glance, MD;  Location: [facility] ORS;  Service: Urology;  Laterality: Bilateral;  GENERAL ANESTHESIA WITH PARALYSIS   ESOPHAGOGASTRODUODENOSCOPY (EGD) WITH PROPOFOL  N/A 06/14/2017   Procedure: ESOPHAGOGASTRODUODENOSCOPY (EGD) WITH PROPOFOL ;  Surgeon: Rivet Claudis RAYMOND, MD;  Location: [facility];  Service: Endoscopy;  Laterality: N/A;   GIVENS CAPSULE STUDY  06/14/2017   Procedure: GIVENS CAPSULE STUDY;  Surgeon: Rivet Claudis RAYMOND, MD;  Location: [facility];  Service: Endoscopy;;   LEFT HEART CATHETERIZATION WITH CORONARY ANGIOGRAM N/A 10/30/2013   Procedure: LEFT HEART CATHETERIZATION WITH CORONARY ANGIOGRAM;  Surgeon: Lonni JONETTA Cash, MD;  Location: [facility] CATH LAB;  Service: Cardiovascular;  Laterality: N/A;   RIGHT/LEFT HEART CATH AND CORONARY ANGIOGRAPHY N/A 05/03/2017   Procedure: RIGHT/LEFT HEART CATH AND CORONARY ANGIOGRAPHY;  Surgeon: Anner Alm LELON, MD;  Location: [facility] INVASIVE CV LAB;  Service: Cardiovascular;  Laterality: N/A;   TEE WITHOUT CARDIOVERSION N/A 02/04/2017   Procedure: TRANSESOPHAGEAL ECHOCARDIOGRAM (TEE) WITH PROPOL;  Surgeon: Alvan Dorn FALCON, MD;  [facility];  Service: Endoscopy;  Laterality: N/A;  TONSILLECTOMY     TOTAL KNEE ARTHROPLASTY     TRANSURETHRAL RESECTION OF BLADDER TUMOR N/A 05/14/2016   Procedure: TRANSURETHRAL RESECTION OF BLADDER TUMOR (TURBT);  Surgeon: Renda Glance, MD;  Location: [facility] ORS;  Service: Urology;  Laterality: N/A;  GENERAL ANESTHESIA WITH PARALYSIS   TRANSURETHRAL RESECTION OF BLADDER TUMOR N/A 11/07/2020   Procedure: TRANSURETHRAL RESECTION OF BLADDER  TUMOR (TURBT);  Surgeon: Sherrilee [name] CROME, MD;  Location: [facility];  Service: Urology;  Laterality: N/A;   YAG LASER APPLICATION Bilateral 11/10/2012   Procedure: YAG LASER APPLICATION;  Surgeon: Dow JULIANNA Burke, MD;  Location: [facility] ORS;  Service: Ophthalmology;  Laterality: Bilateral;    Home Medications:  Allergies as of 11/20/2023       Reactions   Chlor-trimeton [chlorpheniramine] Shortness Of Breath   Carvedilol  Itching, Rash   Facial rash/itching   Demerol  Rash   Penicillins Rash, Other (See Comments)   REACTION: rash, years ago Has patient had a PCN reaction causing immediate rash, facial/tongue/throat swelling, SOB or lightheadedness with hypotension: Yes Has patient had a PCN reaction causing severe rash involving mucus membranes or skin necrosis: No Has patient had a PCN reaction that required hospitalization No Has patient had a PCN reaction occurring within the last 10 years: No If all of the above answers are NO, then may proceed with Cephalosporin use.        Medication List        Accurate as of November 20, 2023 10:46 AM. If you have any questions, ask your nurse or doctor.          acetaminophen  500 MG tablet Commonly known as: TYLENOL  Take 500 mg by mouth every 6 (six) hours as needed for moderate pain.   anastrozole 1 MG tablet Commonly known as: ARIMIDEX Take 1 mg by mouth daily.   atorvastatin  20 MG tablet Commonly known as: LIPITOR TAKE 1 TABLET BY MOUTH AT BEDTIME   balsalazide 750 MG capsule Commonly known as: COLAZAL  TAKE THREE CAPSULES BY MOUTH THREE TIMES DAILY   bisoprolol  5 MG tablet Commonly known as: ZEBETA  TAKE 1/2 TABLET BY MOUTH DAILY   CRANBERRY PO Take 4,200 mg by mouth daily. With vit C   cyclobenzaprine  5 MG tablet Commonly known as: FLEXERIL  Take 1 tablet (5 mg total) by mouth 3 (three) times daily as needed for muscle spasms.   dofetilide  250 MCG capsule Commonly known as: TIKOSYN  TAKE ONE CAPSULE BY MOUTH TWICE  DAILY   furosemide  20 MG tablet Commonly known as: LASIX  TAKE 2 TABLETS BY MOUTH TWICE DAILY   IBgard 90 MG Cpcr Generic drug: Peppermint Oil Take 2 capsules by mouth 3 (three) times daily as needed.   ICAPS AREDS 2 PO Take 2 tablets by mouth daily.   meclizine  25 MG tablet Commonly known as: ANTIVERT  Take 25 mg by mouth 3 (three) times daily as needed.   Metamucil Smooth Texture 58.6 % powder Generic drug: psyllium Take 1 packet by mouth at bedtime.   Myrbetriq  50 MG Tb24 tablet Generic drug: mirabegron  ER TAKE 1 TABLET BY MOUTH DAILY   nitroGLYCERIN  0.4 MG SL tablet Commonly known as: NITROSTAT  Place 0.4 mg under the tongue every 5 (five) minutes as needed for chest pain.   OVER THE COUNTER MEDICATION Vit d one a day   potassium chloride  SA 20 MEQ tablet Commonly known as: KLOR-CON  M TAKE 1 TABLET BY MOUTH TWICE DAILY   sacubitril -valsartan  24-26 MG Commonly known as: ENTRESTO  TAKE 1 TABLET BY MOUTH TWICE  DAILY   sodium chloride  2 % ophthalmic solution Commonly known as: MURO 128 Place 1 drop into both eyes in the morning and at bedtime.   spironolactone  25 MG tablet Commonly known as: ALDACTONE  TAKE 1/2 TABLET BY MOUTH EVERY DAY   Theratears 0.25 % Soln Generic drug: Carboxymethylcellulose Sodium Place 1 drop into both eyes in the morning and at bedtime.   Xarelto  20 MG Tabs tablet Generic drug: rivaroxaban  TAKE 1 TABLET BY MOUTH DAILY WITH SUPPER        Allergies:  Allergies  Allergen Reactions   Chlor-Trimeton [Chlorpheniramine] Shortness Of Breath   Carvedilol  Itching and Rash    Facial rash/itching   Demerol  Rash   Penicillins Rash and Other (See Comments)    REACTION: rash, years ago Has patient had a PCN reaction causing immediate rash, facial/tongue/throat swelling, SOB or lightheadedness with hypotension: Yes Has patient had a PCN reaction causing severe rash involving mucus membranes or skin necrosis: No Has patient had a PCN reaction  that required hospitalization No Has patient had a PCN reaction occurring within the last 10 years: No If all of the above answers are NO, then may proceed with Cephalosporin use.     Family History: Family History  Problem Relation Age of Onset   CAD Father    Diabetes Father    Heart attack Father     Social History:  reports that she has never smoked. She has been exposed to tobacco smoke. She has never used smokeless tobacco. She reports that she does not drink alcohol and does not use drugs.  ROS: All other review of systems were reviewed and are negative except what is noted above in HPI  Physical Exam: BP 117/71   Pulse 60   Constitutional:  Alert and oriented, No acute distress. HEENT: [place] AT, moist mucus membranes.  Trachea midline, no masses. Cardiovascular: No clubbing, cyanosis, or edema. Respiratory: Normal respiratory effort, no increased work of breathing. GI: Abdomen is soft, nontender, nondistended, no abdominal masses GU: No CVA tenderness.  Lymph: No cervical or inguinal lymphadenopathy. Skin: No rashes, bruises or suspicious lesions. Neurologic: Grossly intact, no focal deficits, moving all 4 extremities. Psychiatric: Normal mood and affect.  Laboratory Data: Lab Results  Component Value Date   WBC 9.1 08/06/2022   HGB 14.0 08/06/2022   HCT 42.1 08/06/2022   MCV 88.4 08/06/2022   PLT 303 08/06/2022    Lab Results  Component Value Date   CREATININE 0.86 01/31/2023    No results found for: PSA  No results found for: TESTOSTERONE  No results found for: HGBA1C  Urinalysis    Component Value Date/Time   COLORURINE YELLOW 08/06/2022 1543   APPEARANCEUR Clear 11/19/2022 1306   LABSPEC 1.013 08/06/2022 1543   PHURINE 6.0 08/06/2022 1543   GLUCOSEU Negative 11/19/2022 1306   HGBUR NEGATIVE 08/06/2022 1543   BILIRUBINUR Negative 11/19/2022 1306   KETONESUR NEGATIVE 08/06/2022 1543   PROTEINUR Negative 11/19/2022 1306   PROTEINUR  NEGATIVE 08/06/2022 1543   NITRITE Negative 11/19/2022 1306   NITRITE NEGATIVE 08/06/2022 1543   LEUKOCYTESUR 1+ (A) 11/19/2022 1306   LEUKOCYTESUR NEGATIVE 08/06/2022 1543    Lab Results  Component Value Date   LABMICR See below: 11/19/2022   WBCUA 6-10 (A) 11/19/2022   LABEPIT 0-10 11/19/2022   BACTERIA Few (A) 11/19/2022    Pertinent Imaging:  Results for orders placed during the hospital encounter of 09/29/05  DG Abd 1 View  Narrative Clinical data:   Nausea,  weakness.  PICC line. CHEST - 1 VIEW: Comparison:  09/06/05. Findings:  Mild cardiomegaly.  No pulmonary vascular congestion or active lung process.  PICC line tip is in the superior vena cava, estimated to be approximately 4.3 cm above the superior vena cava/right atrial junction. IMPRESSION: Mild cardiomegaly.  No acute chest findings. ABDOMEN - 2 VIEW: Findings:  Mild gaseous distention of colon in the hepatic flexure region.  Minimal small bowel distention.  No extraluminal gas. IMPRESSION: Findings compatible with mild nonspecific ileus.  Provider: Ubaldo Exon  No results found for this or any previous visit.  No results found for this or any previous visit.  No results found for this or any previous visit.  Results for orders placed during the hospital encounter of 10/08/06  US  Renal  Narrative Clinical Data: Urinary frequency. RENAL/URINARY TRACT ULTRASOUND: Technique: Complete ultrasound examination of the urinary tract was performed including evaluation of the kidneys, renal collecting systems, and urinary bladder. Comparison: CT scan 12/14/04. Findings: The right kidney measures 12.3 cm in length and the left kidney measures 13.0 cm in length. No mass or hydronephrosis. There may be small stones in the lower pole of each kidney with one on the right measuring approximately 0.8 cm and on the left measuring approximately 0.7 cm. Incidental imaging of the urinary bladder is unremarkable. There is a  hypoechoic focus in the region of the right adnexa measuring approximately 5.2 x 3.5 cm. This cannot be definitively characterized.  Impression Possible small bilateral renal stones without hydronephrosis. Possible right ovarian enlargement. Pelvic ultrasound could be used for further evaluation.  Provider: Geni Hock  No results found for this or any previous visit.  No results found for this or any previous visit.  No results found for this or any previous visit.    Cystoscopy Procedure Note  Patient identification was confirmed, informed consent was obtained, and patient was prepped using Betadine solution.  Lidocaine  jelly was administered per urethral meatus.    Procedure: - Flexible cystoscope introduced, without any difficulty.   - Thorough search of the bladder revealed:    normal urethral meatus    normal urothelium    no stones    no ulcers     no tumors    no urethral polyps    no trabeculation  - Ureteral orifices were normal in position and appearance.  Post-Procedure: - Patient tolerated the procedure well      Return in about 1 year (around 11/19/2024) for cystoscopy.  [name] Clara, MD   Assessment & Plan:    1. Malignant neoplasm of overlapping sites of bladder (HCC) (Primary) -followup 1 year for cystoscopy - Urinalysis, Routine w reflex microscopic; Future - Cystoscopy (Bedside) - ciprofloxacin  (CIPRO ) tablet 500 mg - Urinalysis, Routine w reflex microscopic  2. OAB -mirabegron  50mg  daily  No follow-ups on file.  [name] Clara, MD  [facility]  [place]

## 2023-11-20 NOTE — Patient Instructions (Signed)

## 2023-11-21 ENCOUNTER — Encounter: Payer: Self-pay | Admitting: Urology

## 2023-11-22 DIAGNOSIS — Z170 Estrogen receptor positive status [ER+]: Secondary | ICD-10-CM | POA: Diagnosis not present

## 2023-11-22 DIAGNOSIS — C50412 Malignant neoplasm of upper-outer quadrant of left female breast: Secondary | ICD-10-CM | POA: Diagnosis not present

## 2023-12-03 ENCOUNTER — Ambulatory Visit (INDEPENDENT_AMBULATORY_CARE_PROVIDER_SITE_OTHER): Payer: Medicare Other

## 2023-12-03 DIAGNOSIS — I428 Other cardiomyopathies: Secondary | ICD-10-CM | POA: Diagnosis not present

## 2023-12-03 LAB — CUP PACEART REMOTE DEVICE CHECK
Battery Remaining Longevity: 35 mo
Battery Remaining Percentage: 45 %
Battery Voltage: 2.96 V
Brady Statistic AP VP Percent: 87 %
Brady Statistic AP VS Percent: 1 %
Brady Statistic AS VP Percent: 12 %
Brady Statistic AS VS Percent: 1 %
Brady Statistic RA Percent Paced: 72 %
Date Time Interrogation Session: 20251125020009
Implantable Lead Connection Status: 753985
Implantable Lead Connection Status: 753985
Implantable Lead Connection Status: 753985
Implantable Lead Implant Date: 20220223
Implantable Lead Implant Date: 20220223
Implantable Lead Implant Date: 20220223
Implantable Lead Location: 753858
Implantable Lead Location: 753859
Implantable Lead Location: 753860
Implantable Pulse Generator Implant Date: 20220223
Lead Channel Impedance Value: 450 Ohm
Lead Channel Impedance Value: 560 Ohm
Lead Channel Impedance Value: 640 Ohm
Lead Channel Pacing Threshold Amplitude: 0.5 V
Lead Channel Pacing Threshold Amplitude: 1 V
Lead Channel Pacing Threshold Amplitude: 1 V
Lead Channel Pacing Threshold Pulse Width: 0.5 ms
Lead Channel Pacing Threshold Pulse Width: 0.5 ms
Lead Channel Pacing Threshold Pulse Width: 0.8 ms
Lead Channel Sensing Intrinsic Amplitude: 10.4 mV
Lead Channel Sensing Intrinsic Amplitude: 3.2 mV
Lead Channel Setting Pacing Amplitude: 2.25 V
Lead Channel Setting Pacing Amplitude: 2.5 V
Lead Channel Setting Pacing Amplitude: 2.5 V
Lead Channel Setting Pacing Pulse Width: 0.5 ms
Lead Channel Setting Pacing Pulse Width: 0.8 ms
Lead Channel Setting Sensing Sensitivity: 2 mV
Pulse Gen Model: 3562
Pulse Gen Serial Number: 3861599

## 2023-12-04 NOTE — Progress Notes (Signed)
 Remote PPM Transmission

## 2023-12-10 ENCOUNTER — Ambulatory Visit: Payer: Self-pay | Admitting: Cardiovascular Disease

## 2023-12-10 ENCOUNTER — Ambulatory Visit: Admitting: Nurse Practitioner

## 2023-12-16 ENCOUNTER — Ambulatory Visit: Admitting: Nurse Practitioner

## 2024-01-10 ENCOUNTER — Encounter: Payer: Self-pay | Admitting: Nurse Practitioner

## 2024-01-10 ENCOUNTER — Ambulatory Visit: Attending: Nurse Practitioner | Admitting: Nurse Practitioner

## 2024-01-10 VITALS — BP 120/60 | HR 60 | Ht 63.0 in | Wt 215.6 lb

## 2024-01-10 DIAGNOSIS — I4891 Unspecified atrial fibrillation: Secondary | ICD-10-CM

## 2024-01-10 DIAGNOSIS — I5020 Unspecified systolic (congestive) heart failure: Secondary | ICD-10-CM | POA: Diagnosis not present

## 2024-01-10 DIAGNOSIS — R6889 Other general symptoms and signs: Secondary | ICD-10-CM

## 2024-01-10 DIAGNOSIS — Z950 Presence of cardiac pacemaker: Secondary | ICD-10-CM

## 2024-01-10 DIAGNOSIS — E785 Hyperlipidemia, unspecified: Secondary | ICD-10-CM

## 2024-01-10 DIAGNOSIS — I10 Essential (primary) hypertension: Secondary | ICD-10-CM | POA: Diagnosis not present

## 2024-01-10 DIAGNOSIS — I4892 Unspecified atrial flutter: Secondary | ICD-10-CM

## 2024-01-10 DIAGNOSIS — R600 Localized edema: Secondary | ICD-10-CM

## 2024-01-10 DIAGNOSIS — Z79899 Other long term (current) drug therapy: Secondary | ICD-10-CM | POA: Diagnosis not present

## 2024-01-10 DIAGNOSIS — R079 Chest pain, unspecified: Secondary | ICD-10-CM

## 2024-01-10 DIAGNOSIS — I2510 Atherosclerotic heart disease of native coronary artery without angina pectoris: Secondary | ICD-10-CM | POA: Diagnosis not present

## 2024-01-10 DIAGNOSIS — I428 Other cardiomyopathies: Secondary | ICD-10-CM | POA: Diagnosis not present

## 2024-01-10 MED ORDER — FUROSEMIDE 20 MG PO TABS: ORAL_TABLET | ORAL | 3 refills | Status: AC

## 2024-01-10 MED ORDER — NITROGLYCERIN 0.4 MG SL SUBL: 0.4000 mg | SUBLINGUAL_TABLET | SUBLINGUAL | 2 refills | Status: AC | PRN

## 2024-01-10 NOTE — Progress Notes (Signed)
 "  Office Visit    Patient Name: [name] Date of Encounter: 01/10/2024 PCP:  Trudy Vaughn FALCON, MD [facility]  Cardiologist:  Jayson Sierras, MD  Advanced Practice Provider:  Miriam Norris, NP Electrophysiologist:  Eulas FORBES Furbish, MD   Chief Complaint and HPI    [name] is a 89 y.o. female with a hx of HFrEF, nonischemic cardiomyopathy, paroxysmal to persistent atrial fibrillation/A-flutter, CAD, hypertension, hyperlipidemia, ulcerative colitis, history of bladder cancer, breast cancer, pacemaker implanted in 2022, who presents today for overdue 2-3 month follow-up.    Last seen by Dr. Sierras for follow-up on October 03, 2022.  At that time, she presented for preoperative cardiovascular risk assessment.  Was scheduled for left breast lumpectomy with Dr. Donnise at [facility].  Overall was doing well at that time.  12/03/2022 - Today she presents for follow-up with her younger sister.  She confirms to me that she was diagnosed with breast cancer a few months ago, had biopsy performed on August 28, and decided not to undergo surgery due to her advanced age.  She is currently taking Arimidex for treatment.  She tells me that her cancer is slow-growing. Continues to ambulate with walker. Denies any chest pain, shortness of breath, palpitations, syncope, presyncope, dizziness, orthopnea, PND, significant weight changes, acute bleeding, or claudication. Compliant with her medications and tolerating well.  Does note chronic, nonpitting edema to BLE, left greater than right, this is stable per her report.  07/01/2023 -  Here for 6 month follow-up. Admits to feeling weaker since I last saw her. She is not sleeping per her report. Wants to know if she can take melatonin. Feels more short of breath at times. No specific triggers. Denies any chest pain, palpitations, syncope, presyncope, dizziness, orthopnea, PND, swelling or significant weight changes,  acute bleeding, or claudication.  01/10/2024 - Here for overdue follow-up. She admits to chronic, generalized issues with arthritis, notices that her feet are swelling some, she admits to some mild chest pain noticed once in a while and says her chest/back will hurt and she will need to rearrange/reposition her body and symptoms then go away.  Says symptoms are brief in duration and has been noticeable the last month more than usual.  She admits to chronic back issues and has seen a specialist in the past.  She also notices some more forgetfulness. Denies any shortness of breath, palpitations, syncope, presyncope, dizziness, orthopnea, PND, significant weight changes, acute bleeding, or claudication.  SH: Enjoys spending time with family.  Originally from [place] .  Has been married for over 70 years. She is the oldest of 9 children. Her husband is 40 years old.   EKGs/Labs/Other Studies Reviewed:   The following studies were reviewed today:   EKG:  EKG Interpretation Date/Time:  Friday January 10 2024 10:06:50 EST Ventricular Rate:  61 PR Interval:  164 QRS Duration:  216 QT Interval:  506 QTC Calculation: 509 R Axis:   -34  Text Interpretation: AV dual-paced rhythm When compared with ECG of 11-Oct-2023 12:55, Vent. rate has decreased BY   2 BPM Confirmed by Miriam Norris [phone]) on 01/10/2024 10:19:03 AM   Echo 07/2023:  1. Left ventricular ejection fraction, by estimation, is 50 to 55%. The  left ventricle has low normal function. The left ventricle demonstrates  regional wall motion abnormalities (see scoring diagram/findings for  description). There is mild concentric  left ventricular hypertrophy. Left ventricular diastolic parameters are  indeterminate.  2. Right ventricular systolic function is normal. The right ventricular  size is normal. There is normal pulmonary artery systolic pressure. The  estimated right ventricular systolic pressure is 27.4 mmHg.   3. The mitral valve is  degenerative. Trivial mitral valve regurgitation.   4. The aortic valve is tricuspid. Aortic valve regurgitation is not  visualized. Aortic valve sclerosis is present, with no evidence of aortic  valve stenosis. Aortic valve mean gradient measures 3.0 mmHg.   5. Aortic dilatation noted. There is mild dilatation of the aortic root,  measuring 40 mm.   6. The inferior vena cava is normal in size with greater than 50%  respiratory variability, suggesting right atrial pressure of 3 mmHg.   Comparison(s): A prior study was performed on 05/28/2022. Prior images  reviewed side by side. LVEF low normal at 50-55%. Stable, mild aortic root  dilatation at 40 mm.  Echo 05/2022:  1. Left ventricular ejection fraction, by estimation, is 50 to 55%. The  left ventricle has low normal function. The left ventricle demonstrates  global hypokinesis. There is mild left ventricular hypertrophy. Left  ventricular diastolic parameters are consistent with Grade I diastolic dysfunction (impaired relaxation). The average left ventricular global longitudinal strain is -16.9 %. The global longitudinal strain is normal.   2. Right ventricular systolic function is normal. The right ventricular  size is normal. Tricuspid regurgitation signal is inadequate for assessing PA pressure.   3. The mitral valve is degenerative. Trivial mitral valve regurgitation.   4. The aortic valve is tricuspid. Aortic valve regurgitation is not  visualized. Aortic valve sclerosis/calcification is present, without any  evidence of aortic stenosis. Aortic valve mean gradient measures 4.0 mmHg.   5. Aortic dilatation noted. There is mild dilatation of the aortic root,  measuring 40 mm.   6. The inferior vena cava is normal in size with greater than 50%  respiratory variability, suggesting right atrial pressure of 3 mmHg.   Comparison(s): Prior images reviewed side by side. LVEF now low normal range at 50-55%.  Echo 03/2021: 1. Left  ventricular ejection fraction, by estimation, is 40%. The left  ventricle has low normal function. The left ventricle demonstrates global  hypokinesis. There is mild left ventricular hypertrophy. Left ventricular  diastolic parameters are  indeterminate.   2. RV not well visualized. Grossly appears normal in size and function. .  Right ventricular systolic function was not well visualized. The right  ventricular size is not well visualized.   3. The mitral valve was not well visualized. No evidence of mitral valve  regurgitation. No evidence of mitral stenosis.   4. The aortic valve is tricuspid. Aortic valve regurgitation is not  visualized. No aortic stenosis is present.   5. The inferior vena cava is normal in size with greater than 50%  respiratory variability, suggesting right atrial pressure of 3 mmHg.   Comparison(s): LVEF 30-35%.   Review of Systems    All other systems reviewed and are otherwise negative except as noted above.  Physical Exam    VS:  BP 120/60 (BP Location: Left Arm, Cuff Size: Large)   Pulse 60   Ht 5' 3 (1.6 m)   Wt 215 lb 9.6 oz (97.8 kg)   SpO2 93%   BMI 38.19 kg/m  , BMI Body mass index is 38.19 kg/m.  Wt Readings from Last 3 Encounters:  01/10/24 215 lb 9.6 oz (97.8 kg)  10/11/23 215 lb 9.6 oz (97.8 kg)  07/22/23 216 lb (98 kg)  GEN: Obese, 89 y.o. female, in no acute distress. HEENT: normal. Neck: Supple, no JVD, carotid bruits, or masses. Cardiac: S1/S2, RRR, no murmurs, rubs, or gallops. No clubbing, cyanosis. Nonpitting edema, more pronounced (L >R - chronic) per her report.  Radials/PT 2+ and equal bilaterally.  Respiratory:  Respirations regular and unlabored, clear to auscultation bilaterally. MS: No deformity or atrophy. Skin: Pale, warm and dry, no rash. Neuro:  Strength and sensation are intact. Psych: Normal affect.  Assessment & Plan    1.  Chest pain of uncertain etiology Etiology sounds very atypical and most likely  musculoskeletal.  EKG is reassuring today.  Recommend to follow-up with her PCP for noncardiac causes.  History of nonischemic cardiomyopathy-see below.  No indication for ischemic evaluation at this time based on her description.  No medication changes at this time besides what is noted below.  Heart healthy diet recommended.  Care and ED precautions discussed.  2. HFmrEF -> HFimpEF, NICM, SHOB, hx of PPM, leg edema Stage C, NYHA class I-II symptoms. TTE from last year showed stable EF at 50-55%.  Does notice some more recent lower extremity swelling.  Instructed her to take 3 tablets of Lasix  in the morning and 2 tablets of Lasix  in the afternoon.  She verbalized understanding.  Will repeat a BMET in 1 week. Continue bisoprolol , Entresto , and Aldactone . Low sodium diet, fluid restriction <2L, and daily weights encouraged. Educated to contact our office for weight gain of 2 lbs overnight or 5 lbs in one week. Normal device function seen on most recent remote device check. Continue to follow-up with EP.   3. Paroxysmal to Persistent atrial fibrillation/A-flutter Denies any tachycardia/palpitations. HR well controlled today. Continue bisoprolol  and Xarelto  20 mg daily. Denies any bleeding issues. Heart healthy diet and regular cardiovascular exercise encouraged.   4. CAD Stable with no anginal symptoms. No indication for ischemic evaluation. Continue current medication regimen. Heart healthy diet and regular cardiovascular exercise encouraged.   5. HLD No recent lipid panel on file. Will request labs from PCP. Continue current medications. Heart healthy diet and regular cardiovascular exercise encouraged.   6. HTN BP stable. Continue current medications. Discussed to monitor BP at home at least 2 hours after medications and sitting for 5-10 minutes. Heart healthy diet and regular cardiovascular exercise encouraged.   7. Forgetfulness Etiology multifactorial.  Recommend she speak this or with her PCP  for further evaluation.  Disposition: Will provide refill per her request.  Follow up with Jayson Sierras, MD or APP in 3 months or sooner if anything changes.   Signed, Almarie Crate, NP "

## 2024-01-10 NOTE — Patient Instructions (Addendum)
 Medication Instructions:  Your physician has recommended you make the following change in your medication:  Increase Lasix  by taking 3 tablets of 20 mg in the morning and 2 tablets of 20 mg in the afternoon. Continue taking all other medications as prescribed   Labwork: BMET in 1 week at [facility] and have them fax results to us  at [phone]  Testing/Procedures: None  Follow-Up: Your physician recommends that you schedule a follow-up appointment in: 3 months  Any Other Special Instructions Will Be Listed Below (If Applicable). Please let us  know the dosage of Vitamin D you are taking   Thank you for choosing [facility]!     If you need a refill on your cardiac medications before your next appointment, please call your pharmacy.

## 2024-03-03 ENCOUNTER — Ambulatory Visit: Payer: Medicare Other

## 2024-04-13 ENCOUNTER — Ambulatory Visit: Admitting: Nurse Practitioner

## 2024-06-02 ENCOUNTER — Ambulatory Visit: Payer: Medicare Other

## 2024-09-01 ENCOUNTER — Ambulatory Visit: Payer: Medicare Other

## 2024-11-20 ENCOUNTER — Other Ambulatory Visit: Admitting: Urology

## 2024-12-01 ENCOUNTER — Ambulatory Visit: Payer: Medicare Other

## 2025-03-02 ENCOUNTER — Ambulatory Visit: Payer: Medicare Other

## 2025-06-01 ENCOUNTER — Ambulatory Visit: Payer: Medicare Other
